# Patient Record
Sex: Female | Born: 1945
Health system: Southern US, Community
[De-identification: ages and names within clinical notes are randomized; demographics above are authoritative.]

## PROBLEM LIST (undated history)

## (undated) ENCOUNTER — Emergency Department (HOSPITAL_COMMUNITY): Admission: EM | Payer: Medicare HMO

## (undated) DIAGNOSIS — I10 Essential (primary) hypertension: Secondary | ICD-10-CM

## (undated) DIAGNOSIS — N1832 Chronic kidney disease, stage 3b: Secondary | ICD-10-CM

## (undated) DIAGNOSIS — I639 Cerebral infarction, unspecified: Secondary | ICD-10-CM

## (undated) DIAGNOSIS — E119 Type 2 diabetes mellitus without complications: Secondary | ICD-10-CM

## (undated) HISTORY — PX: OTHER SURGICAL HISTORY: SHX169

---

## 2003-03-08 ENCOUNTER — Other Ambulatory Visit: Admission: RE | Admit: 2003-03-08 | Discharge: 2003-03-08 | Payer: Self-pay | Admitting: Internal Medicine

## 2005-01-04 ENCOUNTER — Ambulatory Visit (HOSPITAL_COMMUNITY): Admission: RE | Admit: 2005-01-04 | Discharge: 2005-01-04 | Payer: Self-pay | Admitting: Internal Medicine

## 2009-10-18 ENCOUNTER — Emergency Department (HOSPITAL_COMMUNITY): Admission: EM | Admit: 2009-10-18 | Discharge: 2009-10-18 | Payer: Self-pay | Admitting: Emergency Medicine

## 2009-12-20 ENCOUNTER — Emergency Department (HOSPITAL_COMMUNITY): Admission: EM | Admit: 2009-12-20 | Discharge: 2009-12-20 | Payer: Self-pay | Admitting: Emergency Medicine

## 2010-08-08 ENCOUNTER — Ambulatory Visit (HOSPITAL_COMMUNITY)
Admission: RE | Admit: 2010-08-08 | Discharge: 2010-08-08 | Payer: Self-pay | Source: Home / Self Care | Attending: Internal Medicine | Admitting: Internal Medicine

## 2010-10-16 LAB — URINALYSIS, ROUTINE W REFLEX MICROSCOPIC
Bilirubin Urine: NEGATIVE
Hgb urine dipstick: NEGATIVE
Specific Gravity, Urine: 1.012 (ref 1.005–1.030)
Urobilinogen, UA: 0.2 mg/dL (ref 0.0–1.0)

## 2010-10-16 LAB — URINE MICROSCOPIC-ADD ON

## 2014-10-25 DIAGNOSIS — I10 Essential (primary) hypertension: Secondary | ICD-10-CM | POA: Diagnosis not present

## 2014-10-25 DIAGNOSIS — E119 Type 2 diabetes mellitus without complications: Secondary | ICD-10-CM | POA: Diagnosis not present

## 2014-10-25 DIAGNOSIS — R109 Unspecified abdominal pain: Secondary | ICD-10-CM | POA: Diagnosis not present

## 2014-10-25 DIAGNOSIS — M150 Primary generalized (osteo)arthritis: Secondary | ICD-10-CM | POA: Diagnosis not present

## 2014-11-08 DIAGNOSIS — M150 Primary generalized (osteo)arthritis: Secondary | ICD-10-CM | POA: Diagnosis not present

## 2014-11-08 DIAGNOSIS — I10 Essential (primary) hypertension: Secondary | ICD-10-CM | POA: Diagnosis not present

## 2014-11-08 DIAGNOSIS — M109 Gout, unspecified: Secondary | ICD-10-CM | POA: Diagnosis not present

## 2014-11-08 DIAGNOSIS — E119 Type 2 diabetes mellitus without complications: Secondary | ICD-10-CM | POA: Diagnosis not present

## 2014-12-29 DIAGNOSIS — I10 Essential (primary) hypertension: Secondary | ICD-10-CM | POA: Diagnosis not present

## 2014-12-29 DIAGNOSIS — E119 Type 2 diabetes mellitus without complications: Secondary | ICD-10-CM | POA: Diagnosis not present

## 2014-12-29 DIAGNOSIS — M150 Primary generalized (osteo)arthritis: Secondary | ICD-10-CM | POA: Diagnosis not present

## 2014-12-29 DIAGNOSIS — M109 Gout, unspecified: Secondary | ICD-10-CM | POA: Diagnosis not present

## 2015-04-06 DIAGNOSIS — M150 Primary generalized (osteo)arthritis: Secondary | ICD-10-CM | POA: Diagnosis not present

## 2015-04-06 DIAGNOSIS — E119 Type 2 diabetes mellitus without complications: Secondary | ICD-10-CM | POA: Diagnosis not present

## 2015-04-06 DIAGNOSIS — M109 Gout, unspecified: Secondary | ICD-10-CM | POA: Diagnosis not present

## 2015-04-06 DIAGNOSIS — M2550 Pain in unspecified joint: Secondary | ICD-10-CM | POA: Diagnosis not present

## 2015-04-06 DIAGNOSIS — I10 Essential (primary) hypertension: Secondary | ICD-10-CM | POA: Diagnosis not present

## 2015-04-27 DIAGNOSIS — E119 Type 2 diabetes mellitus without complications: Secondary | ICD-10-CM | POA: Diagnosis not present

## 2015-04-27 DIAGNOSIS — E780 Pure hypercholesterolemia: Secondary | ICD-10-CM | POA: Diagnosis not present

## 2015-04-27 DIAGNOSIS — M109 Gout, unspecified: Secondary | ICD-10-CM | POA: Diagnosis not present

## 2015-04-27 DIAGNOSIS — I10 Essential (primary) hypertension: Secondary | ICD-10-CM | POA: Diagnosis not present

## 2015-06-08 DIAGNOSIS — E119 Type 2 diabetes mellitus without complications: Secondary | ICD-10-CM | POA: Diagnosis not present

## 2015-06-08 DIAGNOSIS — I10 Essential (primary) hypertension: Secondary | ICD-10-CM | POA: Diagnosis not present

## 2015-06-08 DIAGNOSIS — M109 Gout, unspecified: Secondary | ICD-10-CM | POA: Diagnosis not present

## 2015-06-08 DIAGNOSIS — E781 Pure hyperglyceridemia: Secondary | ICD-10-CM | POA: Diagnosis not present

## 2015-07-20 DIAGNOSIS — E039 Hypothyroidism, unspecified: Secondary | ICD-10-CM | POA: Diagnosis not present

## 2015-07-20 DIAGNOSIS — E119 Type 2 diabetes mellitus without complications: Secondary | ICD-10-CM | POA: Diagnosis not present

## 2015-07-20 DIAGNOSIS — M25519 Pain in unspecified shoulder: Secondary | ICD-10-CM | POA: Diagnosis not present

## 2015-07-20 DIAGNOSIS — I10 Essential (primary) hypertension: Secondary | ICD-10-CM | POA: Diagnosis not present

## 2015-07-20 DIAGNOSIS — M109 Gout, unspecified: Secondary | ICD-10-CM | POA: Diagnosis not present

## 2015-07-20 DIAGNOSIS — E109 Type 1 diabetes mellitus without complications: Secondary | ICD-10-CM | POA: Diagnosis not present

## 2015-07-20 DIAGNOSIS — E781 Pure hyperglyceridemia: Secondary | ICD-10-CM | POA: Diagnosis not present

## 2015-10-05 DIAGNOSIS — I10 Essential (primary) hypertension: Secondary | ICD-10-CM | POA: Diagnosis not present

## 2015-10-05 DIAGNOSIS — E119 Type 2 diabetes mellitus without complications: Secondary | ICD-10-CM | POA: Diagnosis not present

## 2015-10-05 DIAGNOSIS — M2550 Pain in unspecified joint: Secondary | ICD-10-CM | POA: Diagnosis not present

## 2015-10-05 DIAGNOSIS — E109 Type 1 diabetes mellitus without complications: Secondary | ICD-10-CM | POA: Diagnosis not present

## 2015-10-05 DIAGNOSIS — M109 Gout, unspecified: Secondary | ICD-10-CM | POA: Diagnosis not present

## 2015-10-05 DIAGNOSIS — E7800 Pure hypercholesterolemia, unspecified: Secondary | ICD-10-CM | POA: Diagnosis not present

## 2015-10-05 DIAGNOSIS — E559 Vitamin D deficiency, unspecified: Secondary | ICD-10-CM | POA: Diagnosis not present

## 2015-11-02 DIAGNOSIS — I10 Essential (primary) hypertension: Secondary | ICD-10-CM | POA: Diagnosis not present

## 2015-11-02 DIAGNOSIS — M109 Gout, unspecified: Secondary | ICD-10-CM | POA: Diagnosis not present

## 2015-11-02 DIAGNOSIS — E7800 Pure hypercholesterolemia, unspecified: Secondary | ICD-10-CM | POA: Diagnosis not present

## 2015-11-02 DIAGNOSIS — E119 Type 2 diabetes mellitus without complications: Secondary | ICD-10-CM | POA: Diagnosis not present

## 2016-02-22 DIAGNOSIS — E7800 Pure hypercholesterolemia, unspecified: Secondary | ICD-10-CM | POA: Diagnosis not present

## 2016-02-22 DIAGNOSIS — M2550 Pain in unspecified joint: Secondary | ICD-10-CM | POA: Diagnosis not present

## 2016-02-22 DIAGNOSIS — E119 Type 2 diabetes mellitus without complications: Secondary | ICD-10-CM | POA: Diagnosis not present

## 2016-02-22 DIAGNOSIS — I10 Essential (primary) hypertension: Secondary | ICD-10-CM | POA: Diagnosis not present

## 2016-02-22 DIAGNOSIS — M109 Gout, unspecified: Secondary | ICD-10-CM | POA: Diagnosis not present

## 2016-06-27 DIAGNOSIS — Z23 Encounter for immunization: Secondary | ICD-10-CM | POA: Diagnosis not present

## 2016-06-27 DIAGNOSIS — I10 Essential (primary) hypertension: Secondary | ICD-10-CM | POA: Diagnosis not present

## 2016-06-27 DIAGNOSIS — E119 Type 2 diabetes mellitus without complications: Secondary | ICD-10-CM | POA: Diagnosis not present

## 2016-06-27 DIAGNOSIS — M1000 Idiopathic gout, unspecified site: Secondary | ICD-10-CM | POA: Diagnosis not present

## 2016-06-27 DIAGNOSIS — E7800 Pure hypercholesterolemia, unspecified: Secondary | ICD-10-CM | POA: Diagnosis not present

## 2016-08-23 ENCOUNTER — Emergency Department (HOSPITAL_COMMUNITY): Payer: Commercial Managed Care - HMO

## 2016-08-23 ENCOUNTER — Encounter (HOSPITAL_COMMUNITY): Payer: Self-pay

## 2016-08-23 ENCOUNTER — Emergency Department (HOSPITAL_COMMUNITY)
Admission: EM | Admit: 2016-08-23 | Discharge: 2016-08-23 | Disposition: A | Discharge status: Home / Self Care | Payer: Commercial Managed Care - HMO | Attending: Emergency Medicine | Admitting: Emergency Medicine

## 2016-08-23 DIAGNOSIS — Y939 Activity, unspecified: Secondary | ICD-10-CM | POA: Insufficient documentation

## 2016-08-23 DIAGNOSIS — Y92410 Unspecified street and highway as the place of occurrence of the external cause: Secondary | ICD-10-CM | POA: Diagnosis not present

## 2016-08-23 DIAGNOSIS — Y999 Unspecified external cause status: Secondary | ICD-10-CM | POA: Insufficient documentation

## 2016-08-23 DIAGNOSIS — I10 Essential (primary) hypertension: Secondary | ICD-10-CM | POA: Diagnosis not present

## 2016-08-23 DIAGNOSIS — S93402A Sprain of unspecified ligament of left ankle, initial encounter: Secondary | ICD-10-CM

## 2016-08-23 DIAGNOSIS — S99912A Unspecified injury of left ankle, initial encounter: Secondary | ICD-10-CM | POA: Diagnosis present

## 2016-08-23 HISTORY — DX: Essential (primary) hypertension: I10

## 2016-08-23 MED ORDER — ACETAMINOPHEN-CODEINE #3 300-30 MG PO TABS: 1.0000 | ORAL_TABLET | Freq: Four times a day (QID) | ORAL | 0 refills | Status: DC | PRN

## 2016-08-23 MED ORDER — HYDROCODONE-ACETAMINOPHEN 5-325 MG PO TABS
1.0000 | ORAL_TABLET | Freq: Once | ORAL | Status: DC
  Filled 2016-08-23: qty 1

## 2016-08-23 NOTE — ED Notes (Signed)
Called ortho to apply ASO.
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
## 2016-08-23 NOTE — Progress Notes (Signed)
Orthopedic Tech Progress Note Patient Details:  Heather Rogers [birth year] [id number]  Ortho Devices Type of Ortho Device: Ankle splint Ortho Device/Splint Location: Applied Velcro Ankle Splint to Left Foot/Ankle.  Pt tolerated well.  Informed pt of care of foot and care of device.  Family at bedside.  Ortho Device/Splint Interventions: Application, Adjustment   Kristopher Oppenheim 08/23/2016, 4:30 PM

## 2016-08-23 NOTE — ED Notes (Signed)
Pt is in stable condition upon d/c and is escorted from ED via wheelchair. 
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
## 2016-10-01 DIAGNOSIS — E7800 Pure hypercholesterolemia, unspecified: Secondary | ICD-10-CM | POA: Diagnosis not present

## 2016-10-01 DIAGNOSIS — E781 Pure hyperglyceridemia: Secondary | ICD-10-CM | POA: Diagnosis not present

## 2016-10-01 DIAGNOSIS — I10 Essential (primary) hypertension: Secondary | ICD-10-CM | POA: Diagnosis not present

## 2016-10-01 DIAGNOSIS — E119 Type 2 diabetes mellitus without complications: Secondary | ICD-10-CM | POA: Diagnosis not present

## 2016-10-01 DIAGNOSIS — E559 Vitamin D deficiency, unspecified: Secondary | ICD-10-CM | POA: Diagnosis not present

## 2016-10-01 DIAGNOSIS — Z23 Encounter for immunization: Secondary | ICD-10-CM | POA: Diagnosis not present

## 2018-05-10 ENCOUNTER — Encounter (HOSPITAL_COMMUNITY): Payer: Self-pay | Admitting: Emergency Medicine

## 2018-05-10 ENCOUNTER — Ambulatory Visit (HOSPITAL_COMMUNITY)
Admission: EM | Admit: 2018-05-10 | Discharge: 2018-05-10 | Disposition: A | Discharge status: Home / Self Care | Payer: Medicare HMO | Source: Home / Self Care

## 2018-05-10 ENCOUNTER — Emergency Department (HOSPITAL_COMMUNITY): Payer: Medicare HMO

## 2018-05-10 ENCOUNTER — Other Ambulatory Visit: Payer: Self-pay

## 2018-05-10 ENCOUNTER — Emergency Department (HOSPITAL_COMMUNITY)
Admission: EM | Admit: 2018-05-10 | Discharge: 2018-05-10 | Disposition: A | Discharge status: Home / Self Care | Payer: Medicare HMO | Attending: Emergency Medicine | Admitting: Emergency Medicine

## 2018-05-10 DIAGNOSIS — Z79899 Other long term (current) drug therapy: Secondary | ICD-10-CM | POA: Diagnosis not present

## 2018-05-10 DIAGNOSIS — M545 Low back pain: Secondary | ICD-10-CM | POA: Insufficient documentation

## 2018-05-10 DIAGNOSIS — I712 Thoracic aortic aneurysm, without rupture: Secondary | ICD-10-CM | POA: Insufficient documentation

## 2018-05-10 DIAGNOSIS — M549 Dorsalgia, unspecified: Secondary | ICD-10-CM | POA: Diagnosis present

## 2018-05-10 DIAGNOSIS — F10929 Alcohol use, unspecified with intoxication, unspecified: Secondary | ICD-10-CM | POA: Diagnosis not present

## 2018-05-10 DIAGNOSIS — I10 Essential (primary) hypertension: Secondary | ICD-10-CM | POA: Diagnosis not present

## 2018-05-10 DIAGNOSIS — R51 Headache: Secondary | ICD-10-CM | POA: Insufficient documentation

## 2018-05-10 DIAGNOSIS — E860 Dehydration: Secondary | ICD-10-CM | POA: Diagnosis not present

## 2018-05-10 LAB — CBC WITH DIFFERENTIAL/PLATELET
ABS IMMATURE GRANULOCYTES: 0.01 10*3/uL (ref 0.00–0.07)
BASOS ABS: 0 10*3/uL (ref 0.0–0.1)
Basophils Relative: 1 %
Eosinophils Absolute: 0 10*3/uL (ref 0.0–0.5)
Eosinophils Relative: 0 %
HCT: 39.7 % (ref 36.0–46.0)
HEMOGLOBIN: 11.9 g/dL — AB (ref 12.0–15.0)
IMMATURE GRANULOCYTES: 0 %
LYMPHS PCT: 47 %
Lymphs Abs: 2.2 10*3/uL (ref 0.7–4.0)
MCH: 22 pg — ABNORMAL LOW (ref 26.0–34.0)
MCHC: 30 g/dL (ref 30.0–36.0)
MCV: 73.2 fL — ABNORMAL LOW (ref 80.0–100.0)
Monocytes Absolute: 0.4 10*3/uL (ref 0.1–1.0)
Monocytes Relative: 8 %
NEUTROS ABS: 2.1 10*3/uL (ref 1.7–7.7)
NEUTROS PCT: 44 %
NRBC: 0 % (ref 0.0–0.2)
Platelets: 326 10*3/uL (ref 150–400)
RBC: 5.42 MIL/uL — ABNORMAL HIGH (ref 3.87–5.11)
RDW: 14.5 % (ref 11.5–15.5)
WBC: 4.8 10*3/uL (ref 4.0–10.5)

## 2018-05-10 LAB — BASIC METABOLIC PANEL
Anion gap: 9 (ref 5–15)
BUN: 16 mg/dL (ref 8–23)
CALCIUM: 9.6 mg/dL (ref 8.9–10.3)
CHLORIDE: 107 mmol/L (ref 98–111)
CO2: 22 mmol/L (ref 22–32)
CREATININE: 0.92 mg/dL (ref 0.44–1.00)
GFR calc Af Amer: >60 mL/min (ref >60)
GFR calc non Af Amer: >60 mL/min (ref >60)
GLUCOSE: 205 mg/dL — AB (ref 70–99)
Potassium: 3.7 mmol/L (ref 3.5–5.1)
SODIUM: 138 mmol/L (ref 135–145)

## 2018-05-10 MED ORDER — AMLODIPINE BESYLATE 5 MG PO TABS
10.0000 mg | ORAL_TABLET | Freq: Once | ORAL | Status: AC
  Administered 2018-05-10: 10 mg via ORAL
  Filled 2018-05-10: qty 2

## 2018-05-10 MED ORDER — CARVEDILOL 12.5 MG PO TABS: 12.5000 mg | ORAL_TABLET | Freq: Two times a day (BID) | ORAL | 0 refills | Status: DC

## 2018-05-10 MED ORDER — CARVEDILOL 12.5 MG PO TABS
12.5000 mg | ORAL_TABLET | Freq: Once | ORAL | Status: AC
  Administered 2018-05-10: 12.5 mg via ORAL
  Filled 2018-05-10: qty 1

## 2018-05-10 MED ORDER — HYDRALAZINE HCL 20 MG/ML IJ SOLN
5.0000 mg | Freq: Once | INTRAMUSCULAR | Status: AC
  Administered 2018-05-10: 5 mg via INTRAVENOUS
  Filled 2018-05-10: qty 1

## 2018-05-10 MED ORDER — HYDRALAZINE HCL 25 MG PO TABS
25.0000 mg | ORAL_TABLET | Freq: Once | ORAL | Status: AC
  Administered 2018-05-10: 25 mg via ORAL
  Filled 2018-05-10: qty 1

## 2018-05-10 MED ORDER — HYDRALAZINE HCL 20 MG/ML IJ SOLN: 10.0000 mg | Freq: Once | INTRAMUSCULAR | Status: DC

## 2018-05-10 MED ORDER — AMLODIPINE BESYLATE 10 MG PO TABS: 10.0000 mg | ORAL_TABLET | Freq: Every day | ORAL | 0 refills | Status: DC

## 2018-05-10 MED ORDER — HYDRALAZINE HCL 25 MG PO TABS: 25.0000 mg | ORAL_TABLET | Freq: Three times a day (TID) | ORAL | 0 refills | Status: DC

## 2018-05-10 NOTE — ED Provider Notes (Signed)
[facility] EMERGENCY DEPARTMENT Provider Note   CSN: [id number] Arrival date & time: 05/10/18  1449     History   Chief Complaint Chief Complaint  Patient presents with  . Hypertension  . Headache  . Blurred Vision    HPI Heather Rogers is a 72 y.o. female.  HPI   72 year old female PMH hypertension, T2DM and gout who presents the ED for headache, hypertension and blurry vision.  Patient states that she has been out of her BP medications for roughly 3 months due to not having a PCP.  Patient usually takes carvedilol, amlodipine, and hydralazine daily for blood pressure control.  States for the last 3 and half days she has had intermittent blurry vision, blurry at baseline compared to her normal, affected by position, worse when she sits up, better when she lays flat.  Denies eye pain.  Denies curtain falling sensation endorses whole vision blurriness.  Denies history of glaucoma.  Patient also endorses intermittent fast headaches associated with coughing.  Patient states that when she coughs she has an occipital, sharp, nonradiating headache that lasts less than a second and resolves with cessation of cough.  Patient denies recent infectious symptoms.  Denies chest pain or shortness of breath.  States this is happened before in the past when she had high blood pressure.  Patient presents her PCP and receive a shot of something she is unable to name and a couple hours later the symptoms would disappear.  Patient denies focal numbness tingling or weakness.  Past Medical History:  Diagnosis Date  . Hypertension     There are no active problems to display for this patient.   No past surgical history on file.   OB History   None      Home Medications    Prior to Admission medications   Medication Sig Start Date End Date Taking? Authorizing Provider  allopurinol (ZYLOPRIM) 100 MG tablet Take 100 mg by mouth daily.   Yes [provider]  aspirin  EC 81 MG tablet Take 81 mg by mouth daily.   Yes [provider]  atorvastatin (LIPITOR) 20 MG tablet Take 20 mg by mouth daily.   Yes [provider]  SitaGLIPtin-MetFORMIN HCl (JANUMET XR) [phone] MG TB24 Take 1 tablet by mouth daily.    Yes [provider]  acetaminophen-codeine (TYLENOL #3) 300-30 MG tablet Take 1-2 tablets by mouth every 6 (six) hours as needed for moderate pain. Patient not taking: Reported on 05/10/2018 08/23/16   Domenic Moras, PA-C  amLODipine (NORVASC) 10 MG tablet Take 1 tablet (10 mg total) by mouth daily. 05/10/18   Keenan Bachelor, MD  carvedilol (COREG) 12.5 MG tablet Take 1 tablet (12.5 mg total) by mouth 2 (two) times daily with a meal. 05/10/18   Keenan Bachelor, MD  glipiZIDE (GLUCOTROL XL) 10 MG 24 hr tablet Take 10 mg by mouth daily with breakfast.    [provider]  hydrALAZINE (APRESOLINE) 25 MG tablet Take 1 tablet (25 mg total) by mouth 3 (three) times daily. 05/10/18   Keenan Bachelor, MD    Family History No family history on file.  Social History Social History   Tobacco Use  . Smoking status: Not on file  Substance Use Topics  . Alcohol use: Not on file  . Drug use: Not on file     Allergies   Sulfa antibiotics   Review of Systems Review of Systems  Constitutional: Negative for chills and fever.  HENT: Negative for ear pain and sore throat.   Eyes: Positive for visual disturbance. Negative for pain.  Respiratory: Positive for cough. Negative for shortness of breath.   Cardiovascular: Negative for chest pain and palpitations.  Gastrointestinal: Negative for abdominal pain and vomiting.  Genitourinary: Negative for dysuria and hematuria.  Musculoskeletal: Negative for arthralgias and back pain.  Skin: Negative for color change and rash.  Neurological: Positive for headaches. Negative for dizziness, seizures, syncope and weakness.  All other systems reviewed and are negative.    Physical  Exam Updated Vital Signs BP (!) 210/74 (BP Location: Right Arm) Comment: Simultaneous filing. User may not have seen previous data.  Pulse 64 Comment: Simultaneous filing. User may not have seen previous data.  Temp 98.2 F (36.8 C)   Resp 18   SpO2 99% Comment: Simultaneous filing. User may not have seen previous data.  Physical Exam  Constitutional: She is oriented to person, place, and time. She appears well-developed and well-nourished. No distress.  HENT:  Head: Normocephalic and atraumatic.  Eyes: Pupils are equal, round, and reactive to light. Conjunctivae and EOM are normal.  Neck: Neck supple.  Cardiovascular: Normal rate and regular rhythm.  No murmur heard. Pulmonary/Chest: Effort normal and breath sounds normal. No respiratory distress.  Abdominal: Soft. There is no tenderness.  Musculoskeletal: She exhibits no edema.  Neurological: She is alert and oriented to person, place, and time. GCS eye subscore is 4. GCS verbal subscore is 5. GCS motor subscore is 6.  PERRLA, CN II through XII intact, 5/5 motor bilateral upper and lower extremities, normal sensation throughout, normal finger-to-nose bilaterally, patient able to ambulate without difficulty or ataxia. 20/20 vision bilaterally  Skin: Skin is warm and dry.  Psychiatric: She has a normal mood and affect.  Nursing note and vitals reviewed.    ED Treatments / Results  Labs (all labs ordered are listed, but only abnormal results are displayed) Labs Reviewed  CBC WITH DIFFERENTIAL/PLATELET - Abnormal; Notable for the following components:      Result Value   RBC 5.42 (*)    Hemoglobin 11.9 (*)    MCV 73.2 (*)    MCH 22.0 (*)    All other components within normal limits  BASIC METABOLIC PANEL - Abnormal; Notable for the following components:   Glucose, Bld 205 (*)    All other components within normal limits    EKG None  Radiology Ct Head Wo Contrast  Result Date: 05/10/2018 CLINICAL DATA:  72 year old  female with history of posterior headache and dizziness. High blood pressure for the past 3 days. EXAM: CT HEAD WITHOUT CONTRAST TECHNIQUE: Contiguous axial images were obtained from the base of the skull through the vertex without intravenous contrast. COMPARISON:  None. FINDINGS: Brain: Patchy areas of decreased attenuation are noted throughout the deep and periventricular white matter of the cerebral hemispheres bilaterally, compatible with chronic microvascular ischemic disease. No evidence of acute infarction, hemorrhage, hydrocephalus, extra-axial collection or mass lesion/mass effect. Vascular: No hyperdense vessel or unexpected calcification. Skull: Normal. Negative for fracture or focal lesion. Sinuses/Orbits: No acute finding. Other: None. IMPRESSION: 1. No acute intracranial abnormalities. 2. Mild chronic microvascular ischemic changes in the cerebral white matter, as above. Electronically Signed   By: Vinnie Langton M.D.   On: 05/10/2018 19:18    Procedures Procedures (including critical care time)  Medications Ordered in ED Medications  amLODipine (NORVASC) tablet 10 mg (10 mg Oral Given 05/10/18 1723)  carvedilol (COREG) tablet 12.5 mg (12.5 mg  Oral Given 05/10/18 1734)  hydrALAZINE (APRESOLINE) injection 5 mg (5 mg Intravenous Given 05/10/18 1721)  hydrALAZINE (APRESOLINE) tablet 25 mg (25 mg Oral Given 05/10/18 2110)     Initial Impression / Assessment and Plan / ED Course  I have reviewed the triage vital signs and the nursing notes.  Pertinent labs & imaging results that were available during my care of the patient were reviewed by me and considered in my medical decision making (see chart for details).     72 year old female PMH hypertension, T2DM and gout who presents the ED for headache, hypertension and blurry vision.  History as above.  Patient likely with hypertension secondary to not taking blood pressure medications for 3 months.  Patient is currently without  headache, minor blurred vision.  Patient denies recent infectious symptoms or nausea vomiting.  Doubt infectious etiology such as meningitis.  Doubt SAH due to intermittent nature of headache.  Doubt stroke due to normal neurological exam.  Potential exists for hypertensive emergency.  Initial blood pressure in triage 242 systolic.    Labs and imaging performed revealed hemoglobin 11.9, no other significant abnormalities.  Reordered home medications, carvedilol, amlodipine and given IV hydralazine.  Patient persistently hypertensive, given p.o. hydralazine.  Blood pressure to 683 systolic.  Improvement of blurry vision.  Patient given referral to PCP.  Patient given 1 month supply of blood pressure medications.  Patient stable for discharge.  Patient endorsed with my attending Dr. Zenia Resides who agrees with plan and disposition.  Final Clinical Impressions(s) / ED Diagnoses   Final diagnoses:  Hypertension, unspecified type    ED Discharge Orders         Ordered    amLODipine (NORVASC) 10 MG tablet  Daily     05/10/18 2147    carvedilol (COREG) 12.5 MG tablet  2 times daily with meals     05/10/18 2147    hydrALAZINE (APRESOLINE) 25 MG tablet  3 times daily     05/10/18 2147           Keenan Bachelor, MD 05/10/18 2315    Lacretia Leigh, MD 05/11/18 2236

## 2018-05-10 NOTE — ED Triage Notes (Signed)
Pt presents today with headaches and blurred vision for 3 days. States she does not typically get headaches and these are the worst headaches she has had. States when she coughs she feels like the back of her head is going to explode. Has only tried baby aspirin several times with no relief.

## 2018-05-10 NOTE — ED Provider Notes (Signed)
I saw and evaluated the patient, reviewed the resident's note and I agree with the findings and plan.  EKG: None 72 year old female with history of hypertension has been noncompliant for several months.  Here complaining of intermittent blurred vision without focal neurological deficits.  Her visual exam here is 20/20.  She has no signs of endorgan damage.  Will give patient small dose of IV hydralazine as well as her home medications.  Head CT is pending at this time.   Lacretia Leigh, MD 05/10/18 [phone]

## 2018-05-10 NOTE — ED Triage Notes (Addendum)
Pt arrives from urgent care for further work up of HTN, headache and intermittent blurred vision for 3-4 days. Pt has not been taking her bp meds since March due to not having a doctor anymore. No headache at present. Pt is also a diabetic. States her MD left the practice and she has [employer] so she does not know who to see now.

## 2018-05-15 ENCOUNTER — Other Ambulatory Visit: Payer: Self-pay

## 2018-05-15 ENCOUNTER — Observation Stay (HOSPITAL_COMMUNITY): Payer: Medicare HMO

## 2018-05-15 ENCOUNTER — Emergency Department (HOSPITAL_COMMUNITY): Payer: Medicare HMO

## 2018-05-15 ENCOUNTER — Observation Stay (HOSPITAL_COMMUNITY)
Admission: EM | Admit: 2018-05-15 | Discharge: 2018-05-16 | Disposition: A | Discharge status: Home / Self Care | Payer: Medicare HMO | Attending: Internal Medicine | Admitting: Internal Medicine

## 2018-05-15 ENCOUNTER — Encounter (HOSPITAL_COMMUNITY): Payer: Self-pay | Admitting: Neurology

## 2018-05-15 ENCOUNTER — Observation Stay (HOSPITAL_BASED_OUTPATIENT_CLINIC_OR_DEPARTMENT_OTHER): Payer: Medicare HMO

## 2018-05-15 DIAGNOSIS — I639 Cerebral infarction, unspecified: Secondary | ICD-10-CM | POA: Diagnosis present

## 2018-05-15 DIAGNOSIS — Z79899 Other long term (current) drug therapy: Secondary | ICD-10-CM | POA: Diagnosis not present

## 2018-05-15 DIAGNOSIS — Z23 Encounter for immunization: Secondary | ICD-10-CM | POA: Diagnosis not present

## 2018-05-15 DIAGNOSIS — Z87891 Personal history of nicotine dependence: Secondary | ICD-10-CM | POA: Diagnosis not present

## 2018-05-15 DIAGNOSIS — I63532 Cerebral infarction due to unspecified occlusion or stenosis of left posterior cerebral artery: Secondary | ICD-10-CM | POA: Diagnosis not present

## 2018-05-15 DIAGNOSIS — Z833 Family history of diabetes mellitus: Secondary | ICD-10-CM | POA: Insufficient documentation

## 2018-05-15 DIAGNOSIS — I161 Hypertensive emergency: Secondary | ICD-10-CM | POA: Diagnosis not present

## 2018-05-15 DIAGNOSIS — Z7984 Long term (current) use of oral hypoglycemic drugs: Secondary | ICD-10-CM | POA: Insufficient documentation

## 2018-05-15 DIAGNOSIS — I5030 Unspecified diastolic (congestive) heart failure: Secondary | ICD-10-CM

## 2018-05-15 DIAGNOSIS — N179 Acute kidney failure, unspecified: Secondary | ICD-10-CM | POA: Insufficient documentation

## 2018-05-15 DIAGNOSIS — Z7982 Long term (current) use of aspirin: Secondary | ICD-10-CM | POA: Insufficient documentation

## 2018-05-15 DIAGNOSIS — E1151 Type 2 diabetes mellitus with diabetic peripheral angiopathy without gangrene: Secondary | ICD-10-CM | POA: Diagnosis not present

## 2018-05-15 DIAGNOSIS — I10 Essential (primary) hypertension: Secondary | ICD-10-CM | POA: Insufficient documentation

## 2018-05-15 DIAGNOSIS — I6523 Occlusion and stenosis of bilateral carotid arteries: Secondary | ICD-10-CM | POA: Insufficient documentation

## 2018-05-15 DIAGNOSIS — R202 Paresthesia of skin: Secondary | ICD-10-CM | POA: Diagnosis present

## 2018-05-15 DIAGNOSIS — I651 Occlusion and stenosis of basilar artery: Secondary | ICD-10-CM | POA: Insufficient documentation

## 2018-05-15 DIAGNOSIS — Z8249 Family history of ischemic heart disease and other diseases of the circulatory system: Secondary | ICD-10-CM | POA: Insufficient documentation

## 2018-05-15 HISTORY — DX: Cerebral infarction, unspecified: I63.9

## 2018-05-15 HISTORY — DX: Type 2 diabetes mellitus without complications: E11.9

## 2018-05-15 LAB — CBC
HEMATOCRIT: 38.6 % (ref 36.0–46.0)
HEMOGLOBIN: 11.7 g/dL — AB (ref 12.0–15.0)
MCH: 22.4 pg — ABNORMAL LOW (ref 26.0–34.0)
MCHC: 30.3 g/dL (ref 30.0–36.0)
MCV: 73.9 fL — ABNORMAL LOW (ref 80.0–100.0)
Platelets: 319 10*3/uL (ref 150–400)
RBC: 5.22 MIL/uL — ABNORMAL HIGH (ref 3.87–5.11)
RDW: 14.3 % (ref 11.5–15.5)
WBC: 6.6 10*3/uL (ref 4.0–10.5)
nRBC: 0 % (ref 0.0–0.2)

## 2018-05-15 LAB — DIFFERENTIAL
Abs Immature Granulocytes: 0.02 10*3/uL (ref 0.00–0.07)
BASOS ABS: 0 10*3/uL (ref 0.0–0.1)
BASOS PCT: 0 %
EOS ABS: 0 10*3/uL (ref 0.0–0.5)
EOS PCT: 0 %
IMMATURE GRANULOCYTES: 0 %
LYMPHS PCT: 26 %
Lymphs Abs: 1.7 10*3/uL (ref 0.7–4.0)
Monocytes Absolute: 0.5 10*3/uL (ref 0.1–1.0)
Monocytes Relative: 7 %
NEUTROS PCT: 67 %
Neutro Abs: 4.4 10*3/uL (ref 1.7–7.7)

## 2018-05-15 LAB — I-STAT CHEM 8, ED
BUN: 19 mg/dL (ref 8–23)
CALCIUM ION: 1.19 mmol/L (ref 1.15–1.40)
CHLORIDE: 108 mmol/L (ref 98–111)
Creatinine, Ser: 1 mg/dL (ref 0.44–1.00)
GLUCOSE: 256 mg/dL — AB (ref 70–99)
HCT: 38 % (ref 36.0–46.0)
Hemoglobin: 12.9 g/dL (ref 12.0–15.0)
Potassium: 3.9 mmol/L (ref 3.5–5.1)
Sodium: 138 mmol/L (ref 135–145)
TCO2: 20 mmol/L — ABNORMAL LOW (ref 22–32)

## 2018-05-15 LAB — RAPID URINE DRUG SCREEN, HOSP PERFORMED
AMPHETAMINES: NOT DETECTED
BENZODIAZEPINES: NOT DETECTED
Barbiturates: NOT DETECTED
COCAINE: NOT DETECTED
OPIATES: NOT DETECTED
TETRAHYDROCANNABINOL: NOT DETECTED

## 2018-05-15 LAB — COMPREHENSIVE METABOLIC PANEL
ALBUMIN: 3.8 g/dL (ref 3.5–5.0)
ALK PHOS: 68 U/L (ref 38–126)
ALT: 18 U/L (ref 0–44)
ANION GAP: 9 (ref 5–15)
AST: 23 U/L (ref 15–41)
BUN: 16 mg/dL (ref 8–23)
CHLORIDE: 106 mmol/L (ref 98–111)
CO2: 20 mmol/L — AB (ref 22–32)
Calcium: 9.4 mg/dL (ref 8.9–10.3)
Creatinine, Ser: 1.06 mg/dL — ABNORMAL HIGH (ref 0.44–1.00)
GFR calc non Af Amer: 51 mL/min — ABNORMAL LOW (ref >60)
GFR, EST AFRICAN AMERICAN: 59 mL/min — AB (ref >60)
GLUCOSE: 253 mg/dL — AB (ref 70–99)
POTASSIUM: 3.9 mmol/L (ref 3.5–5.1)
SODIUM: 135 mmol/L (ref 135–145)
Total Bilirubin: 0.4 mg/dL (ref 0.3–1.2)
Total Protein: 7.6 g/dL (ref 6.5–8.1)

## 2018-05-15 LAB — URINALYSIS, ROUTINE W REFLEX MICROSCOPIC
Bilirubin Urine: NEGATIVE
GLUCOSE, UA: 50 mg/dL — AB
Hgb urine dipstick: NEGATIVE
KETONES UR: 5 mg/dL — AB
Nitrite: NEGATIVE
PH: 5 (ref 5.0–8.0)
Protein, ur: NEGATIVE mg/dL
Specific Gravity, Urine: 1.006 (ref 1.005–1.030)

## 2018-05-15 LAB — PROTIME-INR
INR: 1.04
PROTHROMBIN TIME: 13.5 s (ref 11.4–15.2)

## 2018-05-15 LAB — GLUCOSE, CAPILLARY
Glucose-Capillary: 240 mg/dL — ABNORMAL HIGH (ref 70–99)
Glucose-Capillary: 100 mg/dL — ABNORMAL HIGH (ref 70–99)

## 2018-05-15 LAB — ECHOCARDIOGRAM COMPLETE
HEIGHTINCHES: 62 in
WEIGHTICAEL: 2567.92 [oz_av]

## 2018-05-15 LAB — ETHANOL: Alcohol, Ethyl (B): <10 mg/dL (ref <10)

## 2018-05-15 LAB — APTT: APTT: 29 s (ref 24–36)

## 2018-05-15 LAB — I-STAT TROPONIN, ED: Troponin i, poc: 0 ng/mL (ref 0.00–0.08)

## 2018-05-15 LAB — CBG MONITORING, ED: Glucose-Capillary: 238 mg/dL — ABNORMAL HIGH (ref 70–99)

## 2018-05-15 MED ORDER — INSULIN ASPART 100 UNIT/ML ~~LOC~~ SOLN
0.0000 [IU] | Freq: Three times a day (TID) | SUBCUTANEOUS | Status: DC
  Administered 2018-05-15: 5 [IU] via SUBCUTANEOUS
  Administered 2018-05-16: 2 [IU] via SUBCUTANEOUS
  Administered 2018-05-16: 3 [IU] via SUBCUTANEOUS
  Filled 2018-05-15 (×3): qty 1

## 2018-05-15 MED ORDER — HYDRALAZINE HCL 20 MG/ML IJ SOLN: 5.0000 mg | Freq: Four times a day (QID) | INTRAMUSCULAR | Status: DC | PRN

## 2018-05-15 MED ORDER — ENOXAPARIN SODIUM 40 MG/0.4ML ~~LOC~~ SOLN
40.0000 mg | SUBCUTANEOUS | Status: DC
  Administered 2018-05-15: 40 mg via SUBCUTANEOUS
  Filled 2018-05-15: qty 0.4

## 2018-05-15 MED ORDER — ATORVASTATIN CALCIUM 80 MG PO TABS
80.0000 mg | ORAL_TABLET | Freq: Every day | ORAL | Status: DC
  Administered 2018-05-15 – 2018-05-16 (×2): 80 mg via ORAL
  Filled 2018-05-15 (×2): qty 1

## 2018-05-15 MED ORDER — ACETAMINOPHEN 325 MG PO TABS: 650.0000 mg | ORAL_TABLET | Freq: Four times a day (QID) | ORAL | Status: DC | PRN

## 2018-05-15 MED ORDER — GADOBUTROL 1 MMOL/ML IV SOLN
7.0000 mL | Freq: Once | INTRAVENOUS | Status: AC | PRN
  Administered 2018-05-15: 7 mL via INTRAVENOUS

## 2018-05-15 MED ORDER — SODIUM CHLORIDE 0.9% FLUSH
3.0000 mL | Freq: Two times a day (BID) | INTRAVENOUS | Status: DC
  Administered 2018-05-15 – 2018-05-16 (×2): 3 mL via INTRAVENOUS

## 2018-05-15 MED ORDER — POLYETHYLENE GLYCOL 3350 17 G PO PACK: 17.0000 g | PACK | Freq: Every day | ORAL | Status: DC | PRN

## 2018-05-15 MED ORDER — CLEVIDIPINE BUTYRATE 0.5 MG/ML IV EMUL
0.0000 mg/h | INTRAVENOUS | Status: DC
  Filled 2018-05-15: qty 50

## 2018-05-15 MED ORDER — ASPIRIN EC 81 MG PO TBEC
81.0000 mg | DELAYED_RELEASE_TABLET | Freq: Every day | ORAL | Status: DC
  Administered 2018-05-15: 81 mg via ORAL
  Filled 2018-05-15: qty 1

## 2018-05-15 MED ORDER — STROKE: EARLY STAGES OF RECOVERY BOOK
Freq: Once | Status: AC
  Administered 2018-05-15: 13:00:00
  Filled 2018-05-15: qty 1

## 2018-05-15 MED ORDER — INSULIN ASPART 100 UNIT/ML ~~LOC~~ SOLN: 0.0000 [IU] | Freq: Every day | SUBCUTANEOUS | Status: DC

## 2018-05-15 MED ORDER — ACETAMINOPHEN 650 MG RE SUPP: 650.0000 mg | Freq: Four times a day (QID) | RECTAL | Status: DC | PRN

## 2018-05-15 NOTE — Code Documentation (Signed)
72yo female arriving to [facility] via [facility] from home. Patient with reported right sided numbness, blurred vision and confusion that started at 0815 and witnessed by her neighbor. Code stroke activated by EMS and report patient is hypertensive. Stroke team at the bedside on patient arrival. Labs drawn and patient cleared for CT by Dr. Melina Copa. Patient to CT with team. CT completed. NIHSS 3, see documentation for details and code stroke times. Patient with right upper quadranopsia, decreased sensation to her right face and unable to state the month on exam. Patient's neighbor to the bedside who was with her this morning and has been assisting with her care. Neighbor reports they were at her eye appointment when she began complaining of blurred vision, however, she reports patient has been having blurred vision and numbness since she was seen at the hospital for hypertension a week ago. She also sustained a fall 3 days ago and reports her memory has been poor since that time. SBP 190s. CT showing moderate acute infarct in the left occipital lobe. LKW unclear d/t ongoing symptoms. No acute stroke treatment at this time per Dr. Cheral Marker. Bedside handoff with ED RNs [facility] and [facility].

## 2018-05-15 NOTE — Consult Note (Addendum)
Neurology Consultation  Reason for Consult: Code stroke Referring Physician: Dr. Melina Copa  CC: Decreased sensation on the right and blurred vision  History is obtained from: EMS and neighbor  HPI: Heather Rogers is a 72 y.o. female with hypertension and diabetes.  Patient was last seen normal at 8:30 AM this morning per EMS.  Her neighbor drove her to the ophthalmologist where at that time she complained of right sided numbness along with blurred vision.  It was noted that her blood pressure was systolically in the 161W.  EMS was called and code stroke was initiated.  On arrival to the ED, patient continued to complain of blurred vision and right facial numbness.  She was also confused and missed the month.  CT head showed a subacute left occipital stroke. On further history obtained from family at the bedside, her visual symptoms began on Saturday, which most likely explains the subacute appearance of the left occipital lobe stroke.    Of note, the patient has a history of semicompliance to noncompliance with her medications including her blood pressure medications. She only occasionally takes her ASA, per family. She does have 2 neighbors that take care of her.  One neighbor makes sure that her blood glucose is well controlled.  The other neighbor follows her blood pressure medications and keeps them in a medication box and also administers them to her.  LKW: 8:30 AM on 05/15/2018 tpa given?: No, NIH of 3. Completed stroke on CT  Premorbid modified Rankin scale (mRS): 0 NIH stroke score 3  ROS: Unable to obtain due to impaired ability of patient to provide a history.   Past Medical History:  Diagnosis Date  . Diabetes ([place])   . Hypertension     Family History  Problem Relation Age of Onset  . Hypertension Mother   . Hypertension Father     Social History:   has no tobacco, alcohol, and drug history on file.  Medications  Current Facility-Administered Medications:  .   clevidipine (CLEVIPREX) infusion 0.5 mg/mL, 0-21 mg/hr, Intravenous, Continuous, Kerney Elbe, MD  Current Outpatient Medications:  .  acetaminophen-codeine (TYLENOL #3) 300-30 MG tablet, Take 1-2 tablets by mouth every 6 (six) hours as needed for moderate pain. (Patient not taking: Reported on 05/10/2018), Disp: 15 tablet, Rfl: 0 .  allopurinol (ZYLOPRIM) 100 MG tablet, Take 100 mg by mouth daily., Disp: , Rfl:  .  amLODipine (NORVASC) 10 MG tablet, Take 1 tablet (10 mg total) by mouth daily., Disp: 30 tablet, Rfl: 0 .  aspirin EC 81 MG tablet, Take 81 mg by mouth daily., Disp: , Rfl:  .  atorvastatin (LIPITOR) 20 MG tablet, Take 20 mg by mouth daily., Disp: , Rfl:  .  carvedilol (COREG) 12.5 MG tablet, Take 1 tablet (12.5 mg total) by mouth 2 (two) times daily with a meal., Disp: 60 tablet, Rfl: 0 .  glipiZIDE (GLUCOTROL XL) 10 MG 24 hr tablet, Take 10 mg by mouth daily with breakfast., Disp: , Rfl:  .  hydrALAZINE (APRESOLINE) 25 MG tablet, Take 1 tablet (25 mg total) by mouth 3 (three) times daily., Disp: 90 tablet, Rfl: 0 .  SitaGLIPtin-MetFORMIN HCl (JANUMET XR) [phone] MG TB24, Take 1 tablet by mouth daily. , Disp: , Rfl:    Exam: Current vital signs: BP (!) 188/70   Pulse 81   Temp 98.4 F (36.9 C) (Oral)   Resp 14   Ht 5\' 2"  (1.575 m)   Wt 72.8 kg   SpO2 100%  BMI 29.35 kg/m  Vital signs in last 24 hours: Temp:  [98.4 F (36.9 C)] 98.4 F (36.9 C) (10/17 0913) Pulse Rate:  [78-81] 81 (10/17 0918) Resp:  [14] 14 (10/17 0918) BP: (188-191)/(70-74) 188/70 (10/17 0918) SpO2:  [100 %] 100 % (10/17 0918) Weight:  [72.8 kg] 72.8 kg (10/17 0915)  Physical Exam  Constitutional: Appears well-developed and well-nourished.  Psych: Appears an appropriate for situation, she is very abulic Eyes: No scleral injection HENT: No OP obstrucion Head: Normocephalic.  Cardiovascular: Normal rate and regular rhythm.  Respiratory: Effort normal, non-labored breathing GI: Soft.  No  distension. There is no tenderness.  Skin: WDI  Neuro: Mental Status: Patient is awake, alert to hospital, her age but not the month Patient is unable to give a clear and coherent history. No signs of aphasia or neglect Cranial Nerves: II: Visual Fields were very inconsistent during exam.  Unclear if she understood what was asked of her.  However, she did appear to have a right eye lateral hemianopsia with a inferior nasal crescentic quadrantanopsia. Left eye exhibited superior medial and inferior lateral quadrantanopsia with a medial inferior crescentic visual deficit. The only deficit that was homonymous was a right superior quadrantanopsia.   III,IV, VI: EOMI. Pupils are equal, round, and reactive to light however there is whitening behind the pupils possibly representing cataracts.   V: Facial sensation decreased on the right to light touch VII: Facial movement is symmetric.  VIII: hearing is intact to voice X: Uvula normal.  XI: Shoulder shrug is symmetric. XII: Tongue is midline without atrophy or fasciculations.  Motor: Tone is normal. Bulk is normal. 5/5 strength was present in all four extremities.  Sensory: Sensation is symmetric to light touch and temperature in the arms and legs. Deep Tendon Reflexes: 2+ and symmetric in the biceps and patellae.  Plantars: Toes are downgoing bilaterally.  Cerebellar: FNF and HKS are intact bilaterally  Labs I have reviewed labs in epic and the results pertinent to this consultation are:   CBC    Component Value Date/Time   WBC 6.6 05/15/2018 0854   RBC 5.22 (H) 05/15/2018 0854   HGB 12.9 05/15/2018 0856   HCT 38.0 05/15/2018 0856   PLT 319 05/15/2018 0854   MCV 73.9 (L) 05/15/2018 0854   MCH 22.4 (L) 05/15/2018 0854   MCHC 30.3 05/15/2018 0854   RDW 14.3 05/15/2018 0854   LYMPHSABS 1.7 05/15/2018 0854   MONOABS 0.5 05/15/2018 0854   EOSABS 0.0 05/15/2018 0854   BASOSABS 0.0 05/15/2018 0854    CMP     Component Value  Date/Time   NA 138 05/15/2018 0856   K 3.9 05/15/2018 0856   CL 108 05/15/2018 0856   CO2 22 05/10/2018 1518   GLUCOSE 256 (H) 05/15/2018 0856   BUN 19 05/15/2018 0856   CREATININE 1.00 05/15/2018 0856   CALCIUM 9.6 05/10/2018 1518   GFRNONAA >60 05/10/2018 1518   GFRAA >60 05/10/2018 1518    Lipid Panel  No results found for: CHOL, TRIG, HDL, CHOLHDL, VLDL, LDLCALC, LDLDIRECT   Imaging I have reviewed the images obtained:  CT-scan of the brain--acute to subacute infarct in the left occipital lobe, more subacute than acute.  In addition a lateral left thalamic lacune.  MRI examination of the brain--pending  Etta Quill PA-C [facility] [phone] M-F  (9:00 am- 5:00 PM) 05/15/2018, 9:37 AM    Assessment: 72 year old female brought to hospital as a code stroke with blurred vision, hypertensive urgency and  decreased sensation to her right face.   1. tPA not indicated due to symptom onset clarified as Saturday with family, in addition to completed left occipital lobe stroke seen on CT.   2. Blurred vision likely secondary to a combination of intraocular disease, severe hypertension and the new stroke. The visual exam was with patchy deficits. The only deficit that was homonymous was a right superior quadrantanopsia.  3. CT scan showed subacute ischemic infarction in the left occipital lobe. 4. Also noted on exam is bradyphrenia and confusion, most likely secondary to mild hypertensive encephalopathy, possibly in conjunction with an underlying dementia.   5. Stroke risk factors: DM, HTN  Recommendations: # MRI of the brain without contrast # MRA head without contrast, MRA neck with contrast (estimated GFR is 59, therefore cleared to obtain MRA neck with contrast) # Transthoracic Echo,  # Start patient on ASA 325mg  daily. She has been noncompliant at home, therefore compliance counseling is recommended # Start or continue Atorvastatin 80 mg/other high intensity  statin # BP goal: Modified permissive HTN protocol. Systolic goal less than 366 and diastolically less than 294 # HBAIC and Lipid profile # Telemetry monitoring # Frequent neuro checks # NPO until passes stroke swallow screen # please page stroke NP  Or  PA  Or MD from 8am -4 pm  as this patient from this time will be  followed by the stroke.   You can look them up on [email or web address]  Password [email or web address]  I have seen and examined the patient. I have formulated the assessment and plan and documented additions to the neurological exam.  Electronically signed: Dr. Kerney Elbe

## 2018-05-15 NOTE — H&P (Signed)
Date: 05/15/2018               Patient Name:  Heather Rogers MRN: [id number]  DOB: 22-Aug-1945 Age / Sex: 72 y.o., female   PCP: Patient, No Pcp Per         Medical Service: Internal Medicine Teaching Service         Attending Physician: Dr. Aldine Contes, MD    First Contact: Dr. Laural Golden Pager: [phone]  Second Contact: Dr. Heber Pierre Part Pager: [phone]       After Hours (After 5p/  First Contact Pager: [phone]  weekends / holidays): Second Contact Pager: [phone]   Chief Complaint: Blurry vision with right upper extremity tingling and numbness.  History of Present Illness: Heather Rogers is a 72 y.o. female with PMHx significant for hypertension and diabetes was brought to ED via EMS from his ophthalmologist office with sudden onset right sided tingling and numbness and worsening of her right blurry vision.  Patient was seen in the ED on 05/10/18 due to blurry vision and headache and found to have elevated blood pressure.  Patient was out of her meds for more than 40-month at that time.  Apparently her PCP retires and she has not seen by a new one yet.  She had a normal CT head at that time, was treated as hypertensive urgency and was sent home with her home blood pressure and diabetes meds.  She continued to experience blurry vision, blood pressure trending down, headache improved, this morning she had an appointment to see her ophthalmologist at 8 AM, on arriving to their office she suddenly experienced worsening of her blurry vision only in her right eye along with right-sided tingling and numbness, her blood pressure was elevated at that time, they called the EMS and sent the patient to ED without any ophthalmology evaluation.  Patient denies any headache, nausea or vomiting, chest pain, dyspnea, palpitations, orthopnea or PND. She denies any recent illnesses. She denies any recent change in her appetite, weight or bowel habits. She denies any urinary symptoms.  ED course.   On arrival to ED code stroke was called, she was having elevated blood pressure and CT head shows acute left occipital lobe infarct with a lateral left thalamus lacunar.  Neurology was consulted and she was admitted under internal medicine service to complete work-up for stroke.  Meds:  Current Meds  Medication Sig  . allopurinol (ZYLOPRIM) 100 MG tablet Take 100 mg by mouth daily.  Marland Kitchen amLODipine (NORVASC) 10 MG tablet Take 1 tablet (10 mg total) by mouth daily.  Marland Kitchen atorvastatin (LIPITOR) 20 MG tablet Take 20 mg by mouth daily.  . carvedilol (COREG) 12.5 MG tablet Take 1 tablet (12.5 mg total) by mouth 2 (two) times daily with a meal.  . glipiZIDE (GLUCOTROL XL) 10 MG 24 hr tablet Take 10 mg by mouth daily with breakfast.  . hydrALAZINE (APRESOLINE) 25 MG tablet Take 1 tablet (25 mg total) by mouth 3 (three) times daily.  . SitaGLIPtin-MetFORMIN HCl (JANUMET XR) [phone] MG TB24 Take 1 tablet by mouth daily.   . [DISCONTINUED] aspirin EC 81 MG tablet Take 81 mg by mouth daily.     Allergies: Allergies as of 05/15/2018 - Review Complete 05/15/2018  Allergen Reaction Noted  . Sulfa antibiotics  05/10/2018   Past Medical History:  Diagnosis Date  . Diabetes ([place])   . Hypertension     Family History: Multiple family members including her children with hypertension and diabetes.  Social History: Former smoker, quit in a 1986, denies any alcohol or illicit drug use.  Review of Systems: A complete ROS was negative except as per HPI.   Physical Exam: Blood pressure (!) 196/82, pulse 81, temperature 98.2 F (36.8 C), temperature source Oral, resp. rate 18, height 5\' 2"  (1.575 m), weight 72.8 kg, SpO2 98 %. Vitals:   05/15/18 1100 05/15/18 1115 05/15/18 1200 05/15/18 1240  BP: (!) 166/72 (!) 171/73 117/74 (!) 196/82  Pulse: 73 73 78 81  Resp: 15 12 15 18   Temp:    98.2 F (36.8 C)  TempSrc:    Oral  SpO2: 100% 100% 100% 98%  Weight:      Height:       General: Vital signs  reviewed.  Patient is well-developed and well-nourished, in no acute distress and cooperative with exam.  Head: Normocephalic and atraumatic. Eyes: EOMI, conjunctivae normal, no scleral icterus.  Neck: Supple, trachea midline, normal ROM, no JVD, masses, thyromegaly, or carotid bruit present.  Cardiovascular: RRR, S1 normal, S2 normal, no murmurs, gallops, or rubs. Pulmonary/Chest: Clear to auscultation bilaterally, no wheezes, rales, or rhonchi. Abdominal: Soft, non-tender, non-distended, BS +,  Extremities: No lower extremity edema bilaterally,  pulses symmetric and intact bilaterally. No cyanosis or clubbing. Neurological: A&O x3, Strength is normal and symmetric bilaterally, decreased field of vision on right, rest of cranial nerve  are grossly intact, no focal motor deficit, altered sensations on right upper extremity. Skin: Warm, dry and intact. No rashes or erythema. Psychiatric: Normal mood and affect. speech and behavior is normal. Cognition and memory are normal.  EKG: personally reviewed my interpretation is normal sinus rhythm.  Assessment & Plan by Problem: Heather Rogers is a 72 y.o. female with PMHx significant for hypertension and diabetes was brought to ED via EMS from his ophthalmologist office with sudden onset right sided tingling and numbness and worsening of her right blurry vision.  Active Problems:   Stroke [facility]) Patient presented with left occipital infarct, CT head done during previous ED visit on May 10, 2018 was normal.  TPA was not administered due to history of blurry vision for couple of days. Her risk factors include uncontrolled hypertension and diabetes along with family history.  Patient restarted her medications few days ago. She was experiencing some tingling in her right fingers and altered sensations in the right upper extremity along with decreased peripheral field of vision on right. -Admit to telemetry. -MRI brain. -MRA head and neck. -US  carotid. -Echocardiogram. -A1c -Lipid panel. -Start her on aspirin and Lipitor. -PT/OT evaluation.  Hypertension.  Currently elevated blood pressure.  Holding home dose of antihypertensives for permissive hypertension. She will need low-dose antihypertensives if blood pressure above 200/110.  Diabetes.  Hemoglobin A1c in system. She was on Janumet at home and recently started on glyburide by [facility] on Monday. -Checking A1c. -SSI  AKI.  Mildly elevated creatinine at 1.06 with baseline below 9. -Repeat BMP in the morning.  CODE STATUS.  Full DVT prophylaxis.  Lovenox. Diet.  Heart healthy/carb modified  Dispo: Admit patient to Observation with expected length of stay less than 2 midnights.  SignedLorella Nimrod, MD 05/15/2018, 1:32 PM  Pager: [phone]

## 2018-05-15 NOTE — Progress Notes (Signed)
  Echocardiogram 2D Echocardiogram has been performed.  Heather Rogers 05/15/2018, 2:36 PM

## 2018-05-15 NOTE — ED Notes (Signed)
Report given to 5 C 

## 2018-05-15 NOTE — Progress Notes (Signed)
Bilateral carotid duplex completed - 1% to 39 % ICA stenosis. Vertebral artery flow is antegrade. [place] Cornelious Bartolucci,RVS 05/15/2018 2:28 PM

## 2018-05-15 NOTE — Progress Notes (Signed)
PT Cancellation Note  Patient Details Name: KEYANI RIGDON MRN: [id number] DOB: July 04, 1946   Cancelled Treatment:    Reason Eval/Treat Not Completed: Patient at procedure or test/unavailable Pt currently out of room for testing. Will reattempt as schedule allows.   Leighton Ruff, PT, DPT  Acute Rehabilitation Services  Pager: [phone] Office: [phone]  Rudean Hitt 05/15/2018, 2:36 PM

## 2018-05-15 NOTE — ED Triage Notes (Signed)
Patient arrived via EMS with new onset blurry vision and right sided fac e num bness and tingling.  Per Neighbor, patient was at eye Dr office this morning in her normal until 8:30 am this morning when neighbor noticed confusion, blurred vision, and patient c/o right facial numbness. Code stroke called at [place], patient arrived ast [phone] and brought to CT. Patient has cataracts upon further examination by neuro, poor peripheral field of vision. Still c/o right facial numbness. A/o x 3, difficulty remembering month and year.

## 2018-05-15 NOTE — Progress Notes (Signed)
Pt has been gone since this nurse arrived. Pt went to have testing done since around 12:00 pm and previous nurse has not been able to keep up with her 2 hr neuro checks

## 2018-05-15 NOTE — ED Provider Notes (Signed)
[place] EMERGENCY DEPARTMENT Provider Note   CSN: [id number] Arrival date & time: 05/15/18  8563   An emergency department physician performed an initial assessment on this suspected stroke patient at 0848(butler).  History   Chief Complaint Chief Complaint  Patient presents with  . Code Stroke    HPI Heather Rogers is a 72 y.o. female.  She presents by EMS for a stroke activation.  She has been having some on and off tingling on the right side of her body for a few days.  She was going to the eye doctor today for some worsening blurry vision.  At the doctor she had another attack of right face right arm numbness and she was unable to sign in at the doctor's.  They called 911.  Currently she says her tingling is improved.  sHe is being evaluated neurology  The history is provided by the patient, the EMS personnel and a relative.  Cerebrovascular Accident  This is a new problem. The current episode started less than 1 hour ago. The problem has been gradually improving. Pertinent negatives include no chest pain, no abdominal pain, no headaches and no shortness of breath. Nothing aggravates the symptoms. Nothing relieves the symptoms. She has tried nothing for the symptoms. The treatment provided no relief.    Past Medical History:  Diagnosis Date  . Hypertension     There are no active problems to display for this patient.   No past surgical history on file.   OB History   None      Home Medications    Prior to Admission medications   Medication Sig Start Date End Date Taking? Authorizing Provider  acetaminophen-codeine (TYLENOL #3) 300-30 MG tablet Take 1-2 tablets by mouth every 6 (six) hours as needed for moderate pain. Patient not taking: Reported on 05/10/2018 08/23/16   Domenic Moras, PA-C  allopurinol (ZYLOPRIM) 100 MG tablet Take 100 mg by mouth daily.    [provider]  amLODipine (NORVASC) 10 MG tablet Take 1 tablet (10 mg  total) by mouth daily. 05/10/18   Keenan Bachelor, MD  aspirin EC 81 MG tablet Take 81 mg by mouth daily.    [provider]  atorvastatin (LIPITOR) 20 MG tablet Take 20 mg by mouth daily.    [provider]  carvedilol (COREG) 12.5 MG tablet Take 1 tablet (12.5 mg total) by mouth 2 (two) times daily with a meal. 05/10/18   Keenan Bachelor, MD  glipiZIDE (GLUCOTROL XL) 10 MG 24 hr tablet Take 10 mg by mouth daily with breakfast.    [provider]  hydrALAZINE (APRESOLINE) 25 MG tablet Take 1 tablet (25 mg total) by mouth 3 (three) times daily. 05/10/18   Keenan Bachelor, MD  SitaGLIPtin-MetFORMIN HCl (JANUMET XR) [phone] MG TB24 Take 1 tablet by mouth daily.     [provider]    Family History No family history on file.  Social History Social History   Tobacco Use  . Smoking status: Not on file  Substance Use Topics  . Alcohol use: Not on file  . Drug use: Not on file     Allergies   Sulfa antibiotics   Review of Systems Review of Systems  Constitutional: Negative for fever.  HENT: Negative for sore throat.   Eyes: Positive for visual disturbance (blurry).  Respiratory: Negative for shortness of breath.   Cardiovascular: Negative for chest pain.  Gastrointestinal: Negative for abdominal pain.  Genitourinary: Negative for dysuria.  Musculoskeletal: Negative for neck pain.  Skin: Negative for rash.  Neurological: Positive for numbness. Negative for headaches.     Physical Exam Updated Vital Signs BP (!) 191/74 (BP Location: Left Arm)   Pulse 78   Temp 98.4 F (36.9 C) (Oral)   Ht 5\' 2"  (1.575 m)   Wt 72.8 kg   SpO2 100%   BMI 29.35 kg/m   Physical Exam  Constitutional: She appears well-developed and well-nourished. No distress.  HENT:  Head: Normocephalic and atraumatic.  Eyes: Conjunctivae are normal.  Neck: Neck supple.  Cardiovascular: Normal rate and regular rhythm.  No murmur heard. Pulmonary/Chest: Effort normal  and breath sounds normal. No respiratory distress.  Abdominal: Soft. There is no tenderness.  Musculoskeletal: Normal range of motion. She exhibits no edema or deformity.  Neurological: She is alert.  Patient has some blurry vision.  Possibly he has a field cut.  She has some numbness on the right side of her face.  Her strength seems intact upper and lower extremities symmetric.  Skin: Skin is warm and dry.  Psychiatric: She has a normal mood and affect.  Nursing note and vitals reviewed.    ED Treatments / Results  Labs (all labs ordered are listed, but only abnormal results are displayed) Labs Reviewed  CBC - Abnormal; Notable for the following components:      Result Value   RBC 5.22 (*)    Hemoglobin 11.7 (*)    MCV 73.9 (*)    MCH 22.4 (*)    All other components within normal limits  I-STAT CHEM 8, ED - Abnormal; Notable for the following components:   Glucose, Bld 256 (*)    TCO2 20 (*)    All other components within normal limits  CBG MONITORING, ED - Abnormal; Notable for the following components:   Glucose-Capillary 238 (*)    All other components within normal limits  PROTIME-INR  APTT  DIFFERENTIAL  ETHANOL  COMPREHENSIVE METABOLIC PANEL  RAPID URINE DRUG SCREEN, HOSP PERFORMED  URINALYSIS, ROUTINE W REFLEX MICROSCOPIC  I-STAT TROPONIN, ED    EKG EKG Interpretation  Date/Time:  Thursday May 15 2018 09:25:41 EDT Ventricular Rate:  75 PR Interval:    QRS Duration: 121 QT Interval:  405 QTC Calculation: 453 R Axis:   -2 Text Interpretation:  Sinus rhythm Probable left ventricular hypertrophy ST elevation, consider anterior injury no prior to compare with Confirmed by Aletta Edouard [phone]) on 05/15/2018 9:40:49 AM   Radiology Ct Head Code Stroke Wo Contrast  Result Date: 05/15/2018 CLINICAL DATA:  Code stroke. Right-sided weakness and numbness with slurred speech EXAM: CT HEAD WITHOUT CONTRAST TECHNIQUE: Contiguous axial images were obtained from  the base of the skull through the vertex without intravenous contrast. COMPARISON:  05/10/2018 FINDINGS: Brain: Cytotoxic edema appearance in the parasagittal left occipital lobe involving a moderate area. Infarct in the lateral left thalamus, also seen previously. Mild chronic small vessel ischemic type change in the cerebral white matter. No hemorrhage, hydrocephalus, or masslike finding Vascular: No hyperdense vessel. Skull: No acute or aggressive finding Sinuses/Orbits: Negative Other: These results were communicated to Dr. Cheral Marker at 9:21 [place] 10/17/2019by text page via the [facility] messaging system. ASPECTS [facility] Stroke Program Early CT Score) Not scored given the PCA infarct. IMPRESSION: 1. Moderate acute infarct in the left occipital lobe. 2. Lateral left thalamus lacune. Electronically Signed   By: Monte Fantasia M.D.   On: 05/15/2018 09:23    Procedures .Critical Care Performed by: Aletta Edouard  C, MD Authorized by: Hayden Rasmussen, MD   Critical care provider statement:    Critical care time (minutes):  45   Critical care time was exclusive of:  Separately billable procedures and treating other patients   Critical care was necessary to treat or prevent imminent or life-threatening deterioration of the following conditions:  CNS failure or compromise   Critical care was time spent personally by me on the following activities:  Discussions with consultants, evaluation of patient's response to treatment, examination of patient, ordering and performing treatments and interventions, ordering and review of laboratory studies, ordering and review of radiographic studies, pulse oximetry, re-evaluation of patient's condition, obtaining history from patient or surrogate, review of old charts and development of treatment plan with patient or surrogate   I assumed direction of critical care for this patient from another provider in my specialty: no     (including critical care time)  Medications  Ordered in ED Medications  clevidipine (CLEVIPREX) infusion 0.5 mg/mL (has no administration in time range)     Initial Impression / Assessment and Plan / ED Course  I have reviewed the triage vital signs and the nursing notes.  Pertinent labs & imaging results that were available during my care of the patient were reviewed by me and considered in my medical decision making (see chart for details).  Clinical Course as of May 15 1209  Thu May 15, 2018  0955 He states her PCP is Dr. Coralyn Mark from [facility].  Neurology is recommending medical admission for further work-up of her stroke and management of her hypertension.   [MB]  1032 Nurses stated that they have not started Cleviprex infusion due to neurology change in their recommendations to only start her if she is over 863 systolic.   [MB]  8177 Discussed with internal medicine team who will evaluate the patient for admission.   [MB]    Clinical Course User Index [MB] Hayden Rasmussen, MD   Neurology has evaluated the patient and they do not recommend any TPA.  By her CT she has already signs of a stroke.  They are recommending admission to the hospital for further work-up.  Final Clinical Impressions(s) / ED Diagnoses   Final diagnoses:  Acute ischemic stroke [facility])  Hypertensive emergency    ED Discharge Orders    None       Hayden Rasmussen, MD 05/16/18 1222

## 2018-05-15 NOTE — H&P (View-Only) (Signed)
Date: 05/15/2018               Patient Name:  Heather Rogers MRN: [id number]  DOB: [birth year] Age / Sex: 72 y.o., female   PCP: Patient, No Pcp Per         Medical Service: Internal Medicine Teaching Service         Attending Physician: Dr. Aldine Contes, MD    First Contact: Dr. Laural Golden Pager: [phone]  Second Contact: Dr. Heber [place] Pager: [phone]       After Hours (After 5p/  First Contact Pager: [phone]  weekends / holidays): Second Contact Pager: [phone]   Chief Complaint: Blurry vision with right upper extremity tingling and numbness.  History of Present Illness: Heather Rogers is a 72 y.o. female with PMHx significant for hypertension and diabetes was brought to ED via EMS from his ophthalmologist office with sudden onset right sided tingling and numbness and worsening of her right blurry vision.  Patient was seen in the ED on 05/10/18 due to blurry vision and headache and found to have elevated blood pressure.  Patient was out of her meds for more than 45-month at that time.  Apparently her PCP retires and she has not seen by a new one yet.  She had a normal CT head at that time, was treated as hypertensive urgency and was sent home with her home blood pressure and diabetes meds.  She continued to experience blurry vision, blood pressure trending down, headache improved, this morning she had an appointment to see her ophthalmologist at 8 AM, on arriving to their office she suddenly experienced worsening of her blurry vision only in her right eye along with right-sided tingling and numbness, her blood pressure was elevated at that time, they called the EMS and sent the patient to ED without any ophthalmology evaluation.  Patient denies any headache, nausea or vomiting, chest pain, dyspnea, palpitations, orthopnea or PND. She denies any recent illnesses. She denies any recent change in her appetite, weight or bowel habits. She denies any urinary symptoms.  ED course.   On arrival to ED code stroke was called, she was having elevated blood pressure and CT head shows acute left occipital lobe infarct with a lateral left thalamus lacunar.  Neurology was consulted and she was admitted under internal medicine service to complete work-up for stroke.  Meds:  Current Meds  Medication Sig  . allopurinol (ZYLOPRIM) 100 MG tablet Take 100 mg by mouth daily.  Marland Kitchen amLODipine (NORVASC) 10 MG tablet Take 1 tablet (10 mg total) by mouth daily.  Marland Kitchen atorvastatin (LIPITOR) 20 MG tablet Take 20 mg by mouth daily.  . carvedilol (COREG) 12.5 MG tablet Take 1 tablet (12.5 mg total) by mouth 2 (two) times daily with a meal.  . glipiZIDE (GLUCOTROL XL) 10 MG 24 hr tablet Take 10 mg by mouth daily with breakfast.  . hydrALAZINE (APRESOLINE) 25 MG tablet Take 1 tablet (25 mg total) by mouth 3 (three) times daily.  . SitaGLIPtin-MetFORMIN HCl (JANUMET XR) [phone] MG TB24 Take 1 tablet by mouth daily.   . [DISCONTINUED] aspirin EC 81 MG tablet Take 81 mg by mouth daily.     Allergies: Allergies as of 05/15/2018 - Review Complete 05/15/2018  Allergen Reaction Noted  . Sulfa antibiotics  05/10/2018   Past Medical History:  Diagnosis Date  . Diabetes ([place])   . Hypertension     Family History: Multiple family members including her children with hypertension and diabetes.  Social History: Former smoker, quit in a 1986, denies any alcohol or illicit drug use.  Review of Systems: A complete ROS was negative except as per HPI.   Physical Exam: Blood pressure (!) 196/82, pulse 81, temperature 98.2 F (36.8 C), temperature source Oral, resp. rate 18, height 5\' 2"  (1.575 m), weight 72.8 kg, SpO2 98 %. Vitals:   05/15/18 1100 05/15/18 1115 05/15/18 1200 05/15/18 1240  BP: (!) 166/72 (!) 171/73 117/74 (!) 196/82  Pulse: 73 73 78 81  Resp: 15 12 15 18   Temp:    98.2 F (36.8 C)  TempSrc:    Oral  SpO2: 100% 100% 100% 98%  Weight:      Height:       General: Vital signs  reviewed.  Patient is well-developed and well-nourished, in no acute distress and cooperative with exam.  Head: Normocephalic and atraumatic. Eyes: EOMI, conjunctivae normal, no scleral icterus.  Neck: Supple, trachea midline, normal ROM, no JVD, masses, thyromegaly, or carotid bruit present.  Cardiovascular: RRR, S1 normal, S2 normal, no murmurs, gallops, or rubs. Pulmonary/Chest: Clear to auscultation bilaterally, no wheezes, rales, or rhonchi. Abdominal: Soft, non-tender, non-distended, BS +,  Extremities: No lower extremity edema bilaterally,  pulses symmetric and intact bilaterally. No cyanosis or clubbing. Neurological: A&O x3, Strength is normal and symmetric bilaterally, decreased field of vision on right, rest of cranial nerve  are grossly intact, no focal motor deficit, altered sensations on right upper extremity. Skin: Warm, dry and intact. No rashes or erythema. Psychiatric: Normal mood and affect. speech and behavior is normal. Cognition and memory are normal.  EKG: personally reviewed my interpretation is normal sinus rhythm.  Assessment & Plan by Problem: Heather Rogers is a 72 y.o. female with PMHx significant for hypertension and diabetes was brought to ED via EMS from his ophthalmologist office with sudden onset right sided tingling and numbness and worsening of her right blurry vision.  Active Problems:   Stroke [facility]) Patient presented with left occipital infarct, CT head done during previous ED visit on May 10, 2018 was normal.  TPA was not administered due to history of blurry vision for couple of days. Her risk factors include uncontrolled hypertension and diabetes along with family history.  Patient restarted her medications few days ago. She was experiencing some tingling in her right fingers and altered sensations in the right upper extremity along with decreased peripheral field of vision on right. -Admit to telemetry. -MRI brain. -MRA head and neck. -US  carotid. -Echocardiogram. -A1c -Lipid panel. -Start her on aspirin and Lipitor. -PT/OT evaluation.  Hypertension.  Currently elevated blood pressure.  Holding home dose of antihypertensives for permissive hypertension. She will need low-dose antihypertensives if blood pressure above 200/110.  Diabetes.  Hemoglobin A1c in system. She was on Janumet at home and recently started on glyburide by [facility] on Monday. -Checking A1c. -SSI  AKI.  Mildly elevated creatinine at 1.06 with baseline below 9. -Repeat BMP in the morning.  CODE STATUS.  Full DVT prophylaxis.  Lovenox. Diet.  Heart healthy/carb modified  Dispo: Admit patient to Observation with expected length of stay less than 2 midnights.  SignedLorella Nimrod, MD 05/15/2018, 1:32 PM  Pager: [phone]

## 2018-05-15 NOTE — ED Notes (Signed)
Md VO to hold Cleviprex until SBP > 200

## 2018-05-16 DIAGNOSIS — H538 Other visual disturbances: Secondary | ICD-10-CM

## 2018-05-16 DIAGNOSIS — I6300 Cerebral infarction due to thrombosis of unspecified precerebral artery: Secondary | ICD-10-CM

## 2018-05-16 DIAGNOSIS — Z882 Allergy status to sulfonamides status: Secondary | ICD-10-CM

## 2018-05-16 DIAGNOSIS — Z7982 Long term (current) use of aspirin: Secondary | ICD-10-CM

## 2018-05-16 DIAGNOSIS — Z79899 Other long term (current) drug therapy: Secondary | ICD-10-CM

## 2018-05-16 DIAGNOSIS — G8191 Hemiplegia, unspecified affecting right dominant side: Secondary | ICD-10-CM | POA: Diagnosis not present

## 2018-05-16 DIAGNOSIS — N179 Acute kidney failure, unspecified: Secondary | ICD-10-CM

## 2018-05-16 DIAGNOSIS — I10 Essential (primary) hypertension: Secondary | ICD-10-CM | POA: Diagnosis not present

## 2018-05-16 DIAGNOSIS — E119 Type 2 diabetes mellitus without complications: Secondary | ICD-10-CM

## 2018-05-16 DIAGNOSIS — Z7902 Long term (current) use of antithrombotics/antiplatelets: Secondary | ICD-10-CM

## 2018-05-16 DIAGNOSIS — I63532 Cerebral infarction due to unspecified occlusion or stenosis of left posterior cerebral artery: Secondary | ICD-10-CM

## 2018-05-16 LAB — BASIC METABOLIC PANEL
Anion gap: 11 (ref 5–15)
BUN: 15 mg/dL (ref 8–23)
CALCIUM: 9.9 mg/dL (ref 8.9–10.3)
CO2: 18 mmol/L — ABNORMAL LOW (ref 22–32)
CREATININE: 0.98 mg/dL (ref 0.44–1.00)
Chloride: 110 mmol/L (ref 98–111)
GFR calc non Af Amer: 56 mL/min — ABNORMAL LOW (ref >60)
Glucose, Bld: 100 mg/dL — ABNORMAL HIGH (ref 70–99)
Potassium: 3.5 mmol/L (ref 3.5–5.1)
SODIUM: 139 mmol/L (ref 135–145)

## 2018-05-16 LAB — HEMOGLOBIN A1C
Hgb A1c MFr Bld: 10.9 % — ABNORMAL HIGH (ref 4.8–5.6)
Mean Plasma Glucose: 266.13 mg/dL

## 2018-05-16 LAB — GLUCOSE, CAPILLARY
GLUCOSE-CAPILLARY: 133 mg/dL — AB (ref 70–99)
Glucose-Capillary: 160 mg/dL — ABNORMAL HIGH (ref 70–99)

## 2018-05-16 LAB — LIPID PANEL
CHOL/HDL RATIO: 4.7 ratio
Cholesterol: 126 mg/dL (ref 0–200)
HDL: 27 mg/dL — ABNORMAL LOW (ref >40)
LDL CALC: 64 mg/dL (ref 0–99)
Triglycerides: 175 mg/dL — ABNORMAL HIGH (ref <150)
VLDL: 35 mg/dL (ref 0–40)

## 2018-05-16 MED ORDER — AMLODIPINE BESYLATE 10 MG PO TABS: 10.0000 mg | ORAL_TABLET | Freq: Every day | ORAL | 0 refills | Status: DC

## 2018-05-16 MED ORDER — CLOPIDOGREL BISULFATE 75 MG PO TABS
75.0000 mg | ORAL_TABLET | Freq: Every day | ORAL | Status: DC
  Administered 2018-05-16: 75 mg via ORAL
  Filled 2018-05-16: qty 1

## 2018-05-16 MED ORDER — CLOPIDOGREL BISULFATE 75 MG PO TABS: 75.0000 mg | ORAL_TABLET | Freq: Every day | ORAL | 0 refills | Status: AC

## 2018-05-16 MED ORDER — ASPIRIN 81 MG PO TBEC: 81.0000 mg | DELAYED_RELEASE_TABLET | Freq: Every day | ORAL | Status: DC

## 2018-05-16 MED ORDER — INFLUENZA VAC SPLIT HIGH-DOSE 0.5 ML IM SUSY
0.5000 mL | PREFILLED_SYRINGE | Freq: Once | INTRAMUSCULAR | Status: AC
  Administered 2018-05-16: 0.5 mL via INTRAMUSCULAR
  Filled 2018-05-16 (×2): qty 0.5

## 2018-05-16 MED ORDER — ENOXAPARIN SODIUM 40 MG/0.4ML ~~LOC~~ SOLN
40.0000 mg | SUBCUTANEOUS | Status: DC
  Administered 2018-05-16: 40 mg via SUBCUTANEOUS
  Filled 2018-05-16: qty 0.4

## 2018-05-16 MED ORDER — CARVEDILOL 12.5 MG PO TABS: 12.5000 mg | ORAL_TABLET | Freq: Two times a day (BID) | ORAL | 0 refills | Status: DC

## 2018-05-16 MED ORDER — ATORVASTATIN CALCIUM 80 MG PO TABS: 80.0000 mg | ORAL_TABLET | Freq: Every day | ORAL | 0 refills | Status: DC

## 2018-05-16 MED ORDER — ASPIRIN EC 81 MG PO TBEC
81.0000 mg | DELAYED_RELEASE_TABLET | Freq: Every day | ORAL | Status: DC
  Administered 2018-05-16: 81 mg via ORAL
  Filled 2018-05-16: qty 1

## 2018-05-16 MED ORDER — PNEUMOCOCCAL 13-VAL CONJ VACC IM SUSP
0.5000 mL | Freq: Once | INTRAMUSCULAR | Status: AC
  Administered 2018-05-16: 0.5 mL via INTRAMUSCULAR
  Filled 2018-05-16: qty 0.5

## 2018-05-16 NOTE — Discharge Instructions (Signed)
Heather Rogers,  It was a pleasure taking care of you during your hospital stay. You were hospitalized for a left occipital stroke. We are recommending you start taking some new medications to prevent risk for another stroke.   Please start taking Plavix 75 mg and Aspirin 81 mg daily for 3 weeks, then continue taking just aspirin 81 mg daily. Start taking atorvastatin 80 mg daily.  Also, please restart your anti-hypertensive medications Amlodipine 10 mg and Carvedilol 12.5 mg daily, in 1 week, starting on 10/25. Please make sure to take your diabetes medications as well and follow up with your PCP in one week. Also your cardiology appointment is on 10/22, see below.   Transesophageal echocardiogram procedure: Scheduled for 05/20/18 at 8:00am with Dr. Fransico Him  Instructions: - Nothing to eat after midnight (Monday 10/21) the night before your procedure. You may take your medications with a sip of water the morning of your procedure. You can hold your oral diabetes medications on the morning of your procedure to limit low blood sugar.  - Arrive to [facility]	[place] admitting at 6:30am prior to scheduled procedure time of 8:00am - You will need a responsible adult to drive patient home

## 2018-05-16 NOTE — Evaluation (Signed)
Physical Therapy Evaluation Patient Details Name: Heather Rogers MRN: [id number] DOB: 01-09-1946 Today's Date: 05/16/2018   History of Present Illness  (P) 72 y.o. female with PMHx significant for hypertension and diabetes was brought to ED via EMS from her ophthalmologist office with sudden onset right sided tingling and numbness and worsening of her right blurry vision.  MRI revealed L occipital lobe stroke.     Clinical Impression  Pt admitted with above diagnosis. Pt currently with functional limitations due to the deficits listed below (see PT Problem List). PTA active and independent. On eval, she required supervision transfers and min guard assist ambulation 250 feet without AD. Pt presents with balance deficits primarily relating to visual deficits. Pt will benefit from skilled PT to increase their independence and safety with mobility to allow discharge to the venue listed below.       Follow Up Recommendations Home health PT;Supervision - Intermittent    Equipment Recommendations  None recommended by PT    Recommendations for Other Services       Precautions / Restrictions Precautions Precautions: (P) Fall      Mobility  Bed Mobility Overal bed mobility: Modified Independent             General bed mobility comments: +rail  Transfers Overall transfer level: Needs assistance Equipment used: None Transfers: Sit to/from [facility] Sit to Stand: Supervision Stand pivot transfers: Supervision       General transfer comment: supervision for safety. No physical assist.   Ambulation/Gait Ambulation/Gait assistance: Min guard Gait Distance (Feet): 250 Feet Assistive device: None Gait Pattern/deviations: Step-through pattern;Decreased stride length Gait velocity: decreased Gait velocity interpretation: 1.31 - 2.62 ft/sec, indicative of limited community ambulator General Gait Details: slow, stiff gait with outstretched arms to help  stabilize balance. Pt reports feeling unsteady and unable to walk fast. No overt LOB noted.   Stairs Stairs: Yes Stairs assistance: Min guard Stair Management: One rail Right;Step to pattern;Forwards Number of Stairs: 12 General stair comments: alternating pattern with ascent and step to pattern with descent. Pt reports this is her baseline.   Wheelchair Mobility    Modified Rankin (Stroke Patients Only) Modified Rankin (Stroke Patients Only) Pre-Morbid Rankin Score: No symptoms Modified Rankin: Moderate disability     Balance Overall balance assessment: Needs assistance Sitting-balance support: Feet supported Sitting balance-Leahy Scale: Good                           Standardized Balance Assessment Standardized Balance Assessment : Dynamic Gait Index   Dynamic Gait Index Level Surface: Normal Change in Gait Speed: Normal Gait with Horizontal Head Turns: Mild Impairment Gait with Vertical Head Turns: Mild Impairment Gait and Pivot Turn: Normal Step Over Obstacle: Mild Impairment Step Around Obstacles: Mild Impairment Steps: Mild Impairment Total Score: 19       Pertinent Vitals/Pain Pain Assessment: No/denies pain    Home Living Family/patient expects to be discharged to:: (P) Private residence Living Arrangements: Other relatives(niece) Available Help at Discharge: Friend(s);Available PRN/intermittently Type of Home: House Home Access: Level entry     Home Layout: One level Home Equipment: None      Prior Function Level of Independence: Independent         Comments: Drives. Active.     Hand Dominance        Extremity/Trunk Assessment   Upper Extremity Assessment Upper Extremity Assessment: Defer to OT evaluation    Lower Extremity Assessment Lower Extremity  Assessment: Overall WFL for tasks assessed    Cervical / Trunk Assessment Cervical / Trunk Assessment: Normal  Communication   Communication: No difficulties  Cognition  Arousal/Alertness: Awake/alert Behavior During Therapy: WFL for tasks assessed/performed Overall Cognitive Status: Within Functional Limits for tasks assessed                                        General Comments      Exercises     Assessment/Plan    PT Assessment Patient needs continued PT services  PT Problem List Decreased mobility;Decreased knowledge of precautions;Decreased activity tolerance;Decreased balance       PT Treatment Interventions Therapeutic activities;Gait training;Therapeutic exercise;Patient/family education;Balance training;Stair training;Functional mobility training    PT Goals (Current goals can be found in the Care Plan section)  Acute Rehab PT Goals Patient Stated Goal: independence and return to active schedule PT Goal Formulation: With patient Time For Goal Achievement: 05/30/18 Potential to Achieve Goals: Good    Frequency Min 4X/week   Barriers to discharge        Co-evaluation               AM-PAC PT "6 Clicks" Daily Activity  Outcome Measure Difficulty turning over in bed (including adjusting bedclothes, sheets and blankets)?: None Difficulty moving from lying on back to sitting on the side of the bed? : None Difficulty sitting down on and standing up from a chair with arms (e.g., wheelchair, bedside commode, etc,.)?: None Help needed moving to and from a bed to chair (including a wheelchair)?: None Help needed walking in hospital room?: A Little Help needed climbing 3-5 steps with a railing? : A Little 6 Click Score: 22    End of Session Equipment Utilized During Treatment: Gait belt Activity Tolerance: Patient tolerated treatment well Patient left: in bed;with call bell/phone within reach;with family/visitor present Nurse Communication: Mobility status PT Visit Diagnosis: Unsteadiness on feet (R26.81)    Time: 1025-8527 PT Time Calculation (min) (ACUTE ONLY): 17 min   Charges:   PT Evaluation $PT Eval  Moderate Complexity: 1 Mod          Lorrin Goodell, PT  Office # [phone] Pager [phone]   Lorriane Shire 05/16/2018, 10:40 AM

## 2018-05-16 NOTE — Progress Notes (Addendum)
STROKE TEAM PROGRESS NOTE  HPI:( Dr Cheral Marker ) Heather Rogers is a 72 y.o. female with hypertension and diabetes.  Patient was last seen normal at 8:30 AM this morning per EMS.  Her neighbor drove her to the ophthalmologist where at that time she complained of right sided numbness along with blurred vision.  It was noted that her blood pressure was systolically in the 169C.  EMS was called and code stroke was initiated.  On arrival to the ED, patient continued to complain of blurred vision and right facial numbness.  She was also confused and missed the month.  CT head showed a subacute left occipital stroke. On further history obtained from family at the bedside, her visual symptoms began on Saturday, which most likely explains the subacute appearance of the left occipital lobe stroke.   Of note, the patient has a history of semicompliance to noncompliance with her medications including her blood pressure medications. She only occasionally takes her ASA, per family. She does have 2 neighbors that take care of her.  One neighbor makes sure that her blood glucose is well controlled.  The other neighbor follows her blood pressure medications and keeps them in a medication box and also administers them to her.  LKW: 8:30 AM on 05/15/2008 tpa given?: No, NIH of 3. Completed stroke on CT. Premorbid modified Rankin scale (mRS): 0 NIH stroke score 3  INTERVAL HISTORY Her daughter is at the bedside.  She was concerned about ongoing high BP at home. Seen Monday at [facility]. New meds added. Planned to f/u in 1 week. Went to see her eye doctor Thurs because she was unable to see her fork when eating on Wed, while there her R side became numb.   Vitals:   05/15/18 1949 05/15/18 2337 05/16/18 0351 05/16/18 0757  BP: (!) 175/81 (!) 161/70 (!) 174/83 (!) 168/74  Pulse: 84  80 81  Resp: 18 16 16 16   Temp: 98.7 F (37.1 C) 98.7 F (37.1 C) 98 F (36.7 C) 99.1 F (37.3 C)  TempSrc: Oral Oral  Oral Oral  SpO2: 98% 97% 100% 100%  Weight:      Height:        CBC:  Recent Labs  Lab 05/10/18 1518 05/15/18 0854 05/15/18 0856  WBC 4.8 6.6  --   NEUTROABS 2.1 4.4  --   HGB 11.9* 11.7* 12.9  HCT 39.7 38.6 38.0  MCV 73.2* 73.9*  --   PLT 326 319  --     Basic Metabolic Panel:  Recent Labs  Lab 05/15/18 0854 05/15/18 0856 05/16/18 0420  NA 135 138 139  K 3.9 3.9 3.5  CL 106 108 110  CO2 20*  --  18*  GLUCOSE 253* 256* 100*  BUN 16 19 15   CREATININE 1.06* 1.00 0.98  CALCIUM 9.4  --  9.9   Lipid Panel: No results found for: CHOL, TRIG, HDL, CHOLHDL, VLDL, LDLCALC HgbA1c:  Lab Results  Component Value Date   HGBA1C 10.9 (H) 05/16/2018   Urine Drug Screen:     Component Value Date/Time   LABOPIA NONE DETECTED 05/15/2018 1040   COCAINSCRNUR NONE DETECTED 05/15/2018 1040   LABBENZ NONE DETECTED 05/15/2018 1040   AMPHETMU NONE DETECTED 05/15/2018 1040   THCU NONE DETECTED 05/15/2018 1040   LABBARB NONE DETECTED 05/15/2018 1040    Alcohol Level     Component Value Date/Time   [facility] <10 05/15/2018 7893    IMAGING Mr Jodene Nam Neck W  Wo Contrast  Addendum Date: 05/15/2018   ADDENDUM REPORT: 05/15/2018 16:53 ADDENDUM: Study discussed by telephone with Dr. Shan Levans on 05/15/2018 at 1642 hours. Electronically Signed   By: Genevie Ann M.D.   On: 05/15/2018 16:53   Result Date: 05/15/2018 CLINICAL DATA:  72 year old female with left PCA infarct on code stroke head CT today performed for right side weakness, numbness, slurred speech. By report, visual symptoms began 5 days ago. EXAM: MRI HEAD WITHOUT CONTRAST MRA HEAD WITHOUT CONTRAST MRA NECK WITHOUT AND WITH CONTRAST TECHNIQUE: Multiplanar, multiecho pulse sequences of the brain and surrounding structures were obtained without intravenous contrast. Angiographic images of the Circle of Willis were obtained using MRA technique without intravenous contrast. Angiographic images of the neck were obtained using MRA technique without  and with intravenous contrast. Carotid stenosis measurements (when applicable) are obtained utilizing NASCET criteria, using the distal internal carotid diameter as the denominator. CONTRAST:  7 milliliters Gadavist COMPARISON:  Head CT at 0856 hours. FINDINGS: MRI HEAD FINDINGS Brain: Patchy restricted diffusion in the left PCA territory from the left hippocampus through to the medial left occipital pole (series 5 images 59 and 62. Associated cytotoxic edema with T2 and FLAIR hyperintensity but no mass effect. No convincing associated acute hemorrhage, although there are possibly occasional chronic micro hemorrhages in the brain (right posterior temporal lobe or insula on series 16, image 31. No other restricted diffusion. No midline shift, mass effect, evidence of mass lesion, ventriculomegaly, extra-axial collection or acute intracranial hemorrhage. Cervicomedullary junction and pituitary are within normal limits. Patchy bilateral cerebral white matter T2 and FLAIR hyperintensity but no chronic cortical encephalomalacia. There is faint T2 and FLAIR hyperintensity in the left thalamus although no restricted diffusion. This is superimposed on a chronic left thalamic lacune on series 14, image 15. Mild patchy T2 hyperintensity in the pons. Negative cerebellum. Vascular: Major intracranial vascular flow voids are preserved. Skull and upper cervical spine: Negative for age, C5-C6 chronic disc and endplate degeneration. Visualized bone marrow signal is within normal limits. Sinuses/Orbits: Negative orbit soft tissues. Paranasal sinuses and mastoids are stable and well pneumatized. Other: Visible internal auditory structures appear normal. Scalp and face soft tissues appear negative. MRA NECK FINDINGS Precontrast time-of-flight images demonstrate antegrade flow in both cervical carotid and vertebral arteries to the skull base. There is irregularity at the left carotid bifurcation. Post-contrast neck MRA images reveal a  3 vessel arch configuration. The proximal common carotid arteries are tortuous. No great vessel origin stenosis. No right CCA stenosis. Right carotid bifurcation is patent without stenosis. Negative cervical right ICA. Tortuous left CCA without stenosis. At the left carotid bifurcation there is irregularity at the left ICA origin with stenosis estimated at 50 % with respect to the distal vessel. Note is made of moderate left ICA horizontal petrous segment stenosis on series 1007, image 6. The vertebral artery origins are within normal limits. The vertebral arteries are codominant and tortuous in the neck with no convincing stenosis. MRA HEAD FINDINGS Codominant distal vertebral arteries with antegrade flow to the basilar. Distal vertebral irregularity but no significant stenosis. The left PICA is patent. The right AICA appears dominant. The vertebrobasilar junction is fenestrated, normal variant. There is moderate mid basilar irregularity and stenosis, also evident on the post-contrast neck MRA images. The distal basilar is patent. SCA and right PCA origins are patent. The left PCA is occluded from the mid P1 through the proximal P2 segment with reconstituted flow signal distally (series 1035, image 8). There is mild  stenosis of the right PCA distal P1. Posterior communicating arteries are diminutive or absent. Antegrade flow in both ICA siphons. Moderate to severe stenosis in the left ICA or is on ule petrous segment corroborated with the Neck MRA images. A moderate degree of stenosis is seen at the proximal right ICA cavernous segment (series 9, image 78). The distal ICA siphons and ophthalmic artery origins are patent. Normal MCA and ACA origins. Diminutive or absent anterior communicating artery. Visible ACA branches are within normal limits. Both MCA bifurcations are patent without stenosis. Visible left MCA branches are within normal limits. There is mild irregularity of the visible right MCA branches.  IMPRESSION: 1. Positive for segmental Left PCA occlusion with acute to subacute patchy Left PCA infarct. Territory from the left hippocampus through the medial left occipital pole is affected with edema but no associated hemorrhage or mass effect. 2. Neck MRA is negative for hemodynamically significant stenosis in the neck (50% Left ICA origin stenosis), but Head MRA is positive for: - moderate to severe Left ICA petrous segment and moderate Right ICA cavernous segment stenoses. - moderate mid Basilar Artery stenosis. 3. Chronic small vessel ischemia in the left thalamus. Mild to moderate for age cerebral white matter signal changes also probably small vessel disease related. Electronically Signed: By: Genevie Ann M.D. On: 05/15/2018 16:19   Mr Brain Wo Contrast  Addendum Date: 05/15/2018   ADDENDUM REPORT: 05/15/2018 16:53 ADDENDUM: Study discussed by telephone with Dr. Shan Levans on 05/15/2018 at 1642 hours. Electronically Signed   By: Genevie Ann M.D.   On: 05/15/2018 16:53   Result Date: 05/15/2018 CLINICAL DATA:  72 year old female with left PCA infarct on code stroke head CT today performed for right side weakness, numbness, slurred speech. By report, visual symptoms began 5 days ago. EXAM: MRI HEAD WITHOUT CONTRAST MRA HEAD WITHOUT CONTRAST MRA NECK WITHOUT AND WITH CONTRAST TECHNIQUE: Multiplanar, multiecho pulse sequences of the brain and surrounding structures were obtained without intravenous contrast. Angiographic images of the Circle of Willis were obtained using MRA technique without intravenous contrast. Angiographic images of the neck were obtained using MRA technique without and with intravenous contrast. Carotid stenosis measurements (when applicable) are obtained utilizing NASCET criteria, using the distal internal carotid diameter as the denominator. CONTRAST:  7 milliliters Gadavist COMPARISON:  Head CT at 0856 hours. FINDINGS: MRI HEAD FINDINGS Brain: Patchy restricted diffusion in the left PCA  territory from the left hippocampus through to the medial left occipital pole (series 5 images 59 and 62. Associated cytotoxic edema with T2 and FLAIR hyperintensity but no mass effect. No convincing associated acute hemorrhage, although there are possibly occasional chronic micro hemorrhages in the brain (right posterior temporal lobe or insula on series 16, image 31. No other restricted diffusion. No midline shift, mass effect, evidence of mass lesion, ventriculomegaly, extra-axial collection or acute intracranial hemorrhage. Cervicomedullary junction and pituitary are within normal limits. Patchy bilateral cerebral white matter T2 and FLAIR hyperintensity but no chronic cortical encephalomalacia. There is faint T2 and FLAIR hyperintensity in the left thalamus although no restricted diffusion. This is superimposed on a chronic left thalamic lacune on series 14, image 15. Mild patchy T2 hyperintensity in the pons. Negative cerebellum. Vascular: Major intracranial vascular flow voids are preserved. Skull and upper cervical spine: Negative for age, C5-C6 chronic disc and endplate degeneration. Visualized bone marrow signal is within normal limits. Sinuses/Orbits: Negative orbit soft tissues. Paranasal sinuses and mastoids are stable and well pneumatized. Other: Visible internal auditory structures appear  normal. Scalp and face soft tissues appear negative. MRA NECK FINDINGS Precontrast time-of-flight images demonstrate antegrade flow in both cervical carotid and vertebral arteries to the skull base. There is irregularity at the left carotid bifurcation. Post-contrast neck MRA images reveal a 3 vessel arch configuration. The proximal common carotid arteries are tortuous. No great vessel origin stenosis. No right CCA stenosis. Right carotid bifurcation is patent without stenosis. Negative cervical right ICA. Tortuous left CCA without stenosis. At the left carotid bifurcation there is irregularity at the left ICA origin  with stenosis estimated at 50 % with respect to the distal vessel. Note is made of moderate left ICA horizontal petrous segment stenosis on series 1007, image 6. The vertebral artery origins are within normal limits. The vertebral arteries are codominant and tortuous in the neck with no convincing stenosis. MRA HEAD FINDINGS Codominant distal vertebral arteries with antegrade flow to the basilar. Distal vertebral irregularity but no significant stenosis. The left PICA is patent. The right AICA appears dominant. The vertebrobasilar junction is fenestrated, normal variant. There is moderate mid basilar irregularity and stenosis, also evident on the post-contrast neck MRA images. The distal basilar is patent. SCA and right PCA origins are patent. The left PCA is occluded from the mid P1 through the proximal P2 segment with reconstituted flow signal distally (series 1035, image 8). There is mild stenosis of the right PCA distal P1. Posterior communicating arteries are diminutive or absent. Antegrade flow in both ICA siphons. Moderate to severe stenosis in the left ICA or is on ule petrous segment corroborated with the Neck MRA images. A moderate degree of stenosis is seen at the proximal right ICA cavernous segment (series 9, image 78). The distal ICA siphons and ophthalmic artery origins are patent. Normal MCA and ACA origins. Diminutive or absent anterior communicating artery. Visible ACA branches are within normal limits. Both MCA bifurcations are patent without stenosis. Visible left MCA branches are within normal limits. There is mild irregularity of the visible right MCA branches. IMPRESSION: 1. Positive for segmental Left PCA occlusion with acute to subacute patchy Left PCA infarct. Territory from the left hippocampus through the medial left occipital pole is affected with edema but no associated hemorrhage or mass effect. 2. Neck MRA is negative for hemodynamically significant stenosis in the neck (50% Left ICA  origin stenosis), but Head MRA is positive for: - moderate to severe Left ICA petrous segment and moderate Right ICA cavernous segment stenoses. - moderate mid Basilar Artery stenosis. 3. Chronic small vessel ischemia in the left thalamus. Mild to moderate for age cerebral white matter signal changes also probably small vessel disease related. Electronically Signed: By: Genevie Ann M.D. On: 05/15/2018 16:19   Mr Jodene Nam Head Wo Contrast  Addendum Date: 05/15/2018   ADDENDUM REPORT: 05/15/2018 16:53 ADDENDUM: Study discussed by telephone with Dr. Shan Levans on 05/15/2018 at 1642 hours. Electronically Signed   By: Genevie Ann M.D.   On: 05/15/2018 16:53   Result Date: 05/15/2018 CLINICAL DATA:  72 year old female with left PCA infarct on code stroke head CT today performed for right side weakness, numbness, slurred speech. By report, visual symptoms began 5 days ago. EXAM: MRI HEAD WITHOUT CONTRAST MRA HEAD WITHOUT CONTRAST MRA NECK WITHOUT AND WITH CONTRAST TECHNIQUE: Multiplanar, multiecho pulse sequences of the brain and surrounding structures were obtained without intravenous contrast. Angiographic images of the Circle of Willis were obtained using MRA technique without intravenous contrast. Angiographic images of the neck were obtained using MRA technique without and  with intravenous contrast. Carotid stenosis measurements (when applicable) are obtained utilizing NASCET criteria, using the distal internal carotid diameter as the denominator. CONTRAST:  7 milliliters Gadavist COMPARISON:  Head CT at 0856 hours. FINDINGS: MRI HEAD FINDINGS Brain: Patchy restricted diffusion in the left PCA territory from the left hippocampus through to the medial left occipital pole (series 5 images 59 and 62. Associated cytotoxic edema with T2 and FLAIR hyperintensity but no mass effect. No convincing associated acute hemorrhage, although there are possibly occasional chronic micro hemorrhages in the brain (right posterior temporal lobe  or insula on series 16, image 31. No other restricted diffusion. No midline shift, mass effect, evidence of mass lesion, ventriculomegaly, extra-axial collection or acute intracranial hemorrhage. Cervicomedullary junction and pituitary are within normal limits. Patchy bilateral cerebral white matter T2 and FLAIR hyperintensity but no chronic cortical encephalomalacia. There is faint T2 and FLAIR hyperintensity in the left thalamus although no restricted diffusion. This is superimposed on a chronic left thalamic lacune on series 14, image 15. Mild patchy T2 hyperintensity in the pons. Negative cerebellum. Vascular: Major intracranial vascular flow voids are preserved. Skull and upper cervical spine: Negative for age, C5-C6 chronic disc and endplate degeneration. Visualized bone marrow signal is within normal limits. Sinuses/Orbits: Negative orbit soft tissues. Paranasal sinuses and mastoids are stable and well pneumatized. Other: Visible internal auditory structures appear normal. Scalp and face soft tissues appear negative. MRA NECK FINDINGS Precontrast time-of-flight images demonstrate antegrade flow in both cervical carotid and vertebral arteries to the skull base. There is irregularity at the left carotid bifurcation. Post-contrast neck MRA images reveal a 3 vessel arch configuration. The proximal common carotid arteries are tortuous. No great vessel origin stenosis. No right CCA stenosis. Right carotid bifurcation is patent without stenosis. Negative cervical right ICA. Tortuous left CCA without stenosis. At the left carotid bifurcation there is irregularity at the left ICA origin with stenosis estimated at 50 % with respect to the distal vessel. Note is made of moderate left ICA horizontal petrous segment stenosis on series 1007, image 6. The vertebral artery origins are within normal limits. The vertebral arteries are codominant and tortuous in the neck with no convincing stenosis. MRA HEAD FINDINGS Codominant  distal vertebral arteries with antegrade flow to the basilar. Distal vertebral irregularity but no significant stenosis. The left PICA is patent. The right AICA appears dominant. The vertebrobasilar junction is fenestrated, normal variant. There is moderate mid basilar irregularity and stenosis, also evident on the post-contrast neck MRA images. The distal basilar is patent. SCA and right PCA origins are patent. The left PCA is occluded from the mid P1 through the proximal P2 segment with reconstituted flow signal distally (series 1035, image 8). There is mild stenosis of the right PCA distal P1. Posterior communicating arteries are diminutive or absent. Antegrade flow in both ICA siphons. Moderate to severe stenosis in the left ICA or is on ule petrous segment corroborated with the Neck MRA images. A moderate degree of stenosis is seen at the proximal right ICA cavernous segment (series 9, image 78). The distal ICA siphons and ophthalmic artery origins are patent. Normal MCA and ACA origins. Diminutive or absent anterior communicating artery. Visible ACA branches are within normal limits. Both MCA bifurcations are patent without stenosis. Visible left MCA branches are within normal limits. There is mild irregularity of the visible right MCA branches. IMPRESSION: 1. Positive for segmental Left PCA occlusion with acute to subacute patchy Left PCA infarct. Territory from the left hippocampus through  the medial left occipital pole is affected with edema but no associated hemorrhage or mass effect. 2. Neck MRA is negative for hemodynamically significant stenosis in the neck (50% Left ICA origin stenosis), but Head MRA is positive for: - moderate to severe Left ICA petrous segment and moderate Right ICA cavernous segment stenoses. - moderate mid Basilar Artery stenosis. 3. Chronic small vessel ischemia in the left thalamus. Mild to moderate for age cerebral white matter signal changes also probably small vessel disease  related. Electronically Signed: By: Genevie Ann M.D. On: 05/15/2018 16:19   Ct Head Code Stroke Wo Contrast  Result Date: 05/15/2018 CLINICAL DATA:  Code stroke. Right-sided weakness and numbness with slurred speech EXAM: CT HEAD WITHOUT CONTRAST TECHNIQUE: Contiguous axial images were obtained from the base of the skull through the vertex without intravenous contrast. COMPARISON:  05/10/2018 FINDINGS: Brain: Cytotoxic edema appearance in the parasagittal left occipital lobe involving a moderate area. Infarct in the lateral left thalamus, also seen previously. Mild chronic small vessel ischemic type change in the cerebral white matter. No hemorrhage, hydrocephalus, or masslike finding Vascular: No hyperdense vessel. Skull: No acute or aggressive finding Sinuses/Orbits: Negative Other: These results were communicated to Dr. Cheral Marker at 9:21 [place] 10/17/2019by text page via the [facility] messaging system. ASPECTS [facility] Stroke Program Early CT Score) Not scored given the PCA infarct. IMPRESSION: 1. Moderate acute infarct in the left occipital lobe. 2. Lateral left thalamus lacune. Electronically Signed   By: Monte Fantasia M.D.   On: 05/15/2018 09:23   Carotid Doppler   There is 1-39% bilateral ICA stenosis. Vertebral artery flow is antegrade.   2D Echocardiogram  - Left ventricle: The cavity size was normal. Wall thickness was increased in a pattern of moderate LVH. Systolic function was normal. The estimated ejection fraction was in the range of 55% to 60%. Wall motion was normal; there were no regional wall motion abnormalities. Doppler parameters are consistent with abnormal left ventricular relaxation (grade 1 diastolic dysfunction). - Aortic valve: There was no stenosis. - Mitral valve: There was no significant regurgitation. - Left atrium: The atrium was mildly dilated. - Right ventricle: The cavity size was normal. Systolic function was normal. - Pulmonary arteries: No complete TR doppler jet so  unable to estimate PA systolic pressure. Impressions:  Normal LV size with moderate LV hypertrophy. EF 55-60%. Normal RV size and systolic function. No significant valvular abnormalities.   PHYSICAL EXAM Pleasant elderly Caucasian lady currently not in distress. . Afebrile. Head is nontraumatic. Neck is supple without bruit.    Cardiac exam no murmur or gallop. Lungs are clear to auscultation. Distal pulses are well felt. Neurological Exam ;  Awake  Alert oriented x 3. Normal speech and language.diminished as tension, registration and recall. Recall 0/3.eye movements full without nystagmus.fundi were not visualized. Vision acuity  appears normal.dense right homonymous hemianopsia. Hearing is normal. Palatal movements are normal. Face symmetric. Tongue midline. Normal strength, tone, reflexes and coordination. Normal sensation. Gait deferred.  NIHSS 2  ASSESSMENT/PLAN Heather Rogers is a 72 y.o. female with history of HTN and DB presenting with blurred vision, hypertensive urgency and altered sensation R face.   Stroke:  left PCA infarct embolic secondary to unknown  source  Code Stroke CT head L occipital stroke. L thalamic lacune   MRI  L PCA occlusion.  L PCA infarct. Old L thalamic lacune. Small vessel disease. Atrophy.   MRA neck negative  MRA head mod to severe L ICA petrous and  mod R ICA cavernous segment stenosis, mod mid BA stenosis.   Carotid doppler B ICA 1-39% stenosis, VAs antegrade   2D Echo  EF 55-60%. No source of embolus   Needs OP TEE and loop. Have asked cardiology to arrange  LDL pending   HgbA1c 10.9  Lovenox 40 mg sq daily for VTE prophylaxis  No antithrombotic prior to admission, now on aspirin 81 mg daily. Given mild stroke, recommend aspirin 81 mg and plavix 75 mg daily x 3 weeks, then aspirin alone. Orders adjusted.   Therapy recommendations:  HH PT, OT, 3N1  Disposition:  pending   Hypertensive Urgency  BP as high as  196/83  Stable . Permissive hypertension (OK if < 220/120) but gradually normalize in 5-7 days . Long-term BP goal normotensive  Hyperlipidemia  Home meds:  lipitor 20  Now on lipitor 80 in hospital  LDL pending, goal < 70  Continue statin at discharge  Diabetes type II  HgbA1c 10.9, goal < 7.0  Uncontrolled  Other Stroke Risk Factors  Advanced age  Overweight, Body mass index is 29.35 kg/m., recommend weight loss, diet and exercise as appropriate   Other Active Problems  Cognitive decline post stroke  CKI, Cr 1.06->1.00  Hospital day # 0  Burnetta Sabin, MSN, APRN, ANVP-BC, AGPCNP-BC Advanced Practice Stroke Nurse [place] for Schedule & Pager information 05/16/2018 10:36 AM  I have personally examined this patient, reviewed notes, independently viewed imaging studies, participated in medical decision making and plan of care.ROS completed by me personally and pertinent positives fully documented  I have made any additions or clarifications directly to the above note. Agree with note above. 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 Recommend dual antiplatelet therapy x 3 weeks and then aspirin alone. Check transesophageal echocardiogram and loop recorder for paroxysmal A. Fib. Patient advised not to drive. Long discussion with the patient and daughter at the bedside as well as with Dr. Donalee Citrin and his team and answered questions. Greater than 50% time during this 35 minute visit was spent on counseling and coordination of care but embolic stroke and discussion about plan for evaluation and treatment and answered questions. She may also consider possible pars patient in the [place] trial if  interested. She will be given information to review and decide  Antony Contras, MD Medical Director [place] Pager: [phone] 05/16/2018 5:06 PM  To contact Stroke Continuity provider, please refer to [email or web address] After hours, contact General Neurology

## 2018-05-16 NOTE — Progress Notes (Addendum)
    [facility] has been requested to perform a transesophageal echocardiogram on Ms. Heather Rogers  for stroke.  After careful review of history and examination, the risks and benefits of transesophageal echocardiogram have been explained including risks of esophageal damage, perforation (1:10,000 risk), bleeding, pharyngeal hematoma as well as other potential complications associated with conscious sedation including aspiration, arrhythmia, respiratory failure and death. Alternatives to treatment were discussed, questions were answered. Patient is willing to proceed. Also discussed with daughter [name] over phone.  TEE - 05/20/18 as outpatient Dr. Radford Pax @ 8000am. Please arrive to [facility] for check in at 6:30am. NPO after midnight. Meds with sips is okay.   Leanor Kail, PA-C 05/16/2018 2:20 PM

## 2018-05-16 NOTE — Progress Notes (Signed)
OT Progress Note  Focus of session on education regarding safety and visual field cuts in addition to apparent cognitive deficits. Family present for education and verbalizes understanding for need to refrain from driving and need for S with all IADL tasks, especially medication management and financial management. Discussed safety issues regarding cooking at stove level. Began education on compensatory techniques to improve "functional vision". Recommend follow up with her eye doctor to have a full field visual assessement in order to determine her baseline field cut.     05/16/18 1100  OT Visit Information  Last OT Received On 05/16/18  Assistance Needed +1  History of Present Illness 72 y.o. female with PMHx significant for hypertension and diabetes was brought to ED via EMS from her ophthalmologist office with sudden onset right sided tingling and numbness and worsening of her right blurry vision.  MRI revealed L occipital lobe stroke.   Precautions  Precautions Fall  Pain Assessment  Pain Assessment No/denies pain  Cognition  Arousal/Alertness Awake/alert  Behavior During Therapy WFL for tasks assessed/performed  Overall Cognitive Status Impaired/Different from baseline  Area of Impairment Orientation;Memory;Safety/judgement;Awareness;Problem solving  Orientation Level Disoriented to;Time  Memory Decreased short-term memory  Safety/Judgement Decreased awareness of safety;Decreased awareness of deficits  Awareness Emergent  Problem Solving Slow processing  General Comments will further assess  OT - End of Session  Activity Tolerance Patient tolerated treatment well  Patient left in bed;with call bell/phone within reach;with family/visitor present  Nurse Communication Other (comment) (DC needs)  OT Assessment/Plan  OT Plan Discharge plan remains appropriate  OT Visit Diagnosis Unsteadiness on feet (R26.81);Low vision, both eyes (H54.2);Other (comment)  OT Frequency (ACUTE ONLY)  Min 3X/week  Follow Up Recommendations Home health OT;Supervision - Intermittent  OT Equipment 3 in 1 bedside commode  AM-PAC OT "6 Clicks" Daily Activity Outcome Measure  Help from another person eating meals? 4  Help from another person taking care of personal grooming? 3  Help from another person toileting, which includes using toliet, bedpan, or urinal? 3  Help from another person bathing (including washing, rinsing, drying)? 3  Help from another person to put on and taking off regular upper body clothing? 4  Help from another person to put on and taking off regular lower body clothing? 3  6 Click Score 20  ADL G Code Conversion CJ  OT Goal Progression  Progress towards OT goals Progressing toward goals  Acute Rehab OT Goals  Patient Stated Goal independence and return to active schedule  OT Goal Formulation With patient/family  Time For Goal Achievement 05/30/18  Potential to Achieve Goals Good  OT Time Calculation  OT Start Time (ACUTE ONLY) 1055  OT Stop Time (ACUTE ONLY) 1115  OT Time Calculation (min) 20 min  OT General Charges  $OT Visit 1 Visit  OT Treatments  $Self Care/Home Management  8-22 mins  Maurie Boettcher, OT/L   Acute OT Clinical Specialist [place] Pager [phone] Office [phone]

## 2018-05-16 NOTE — Care Management Note (Addendum)
Case Management Note  Patient Details  Name: Heather Rogers MRN: [id number] Date of Birth: 03-24-1946  Subjective/Objective: 72yo female presented with blurry vision with RUE tingling and numbness.                   Action/Plan: CM met with patient/family to discuss transitional needs. Patient lived at home with her mentally challenged niece whom she cared for, independent with ADLs, with no DME in use. PCP verified as: Emelia Loron, NP with [facility] [place]; pharmacy of choice: [facility], [place]. PT/OT eval completed with HHPT/OT and BSC recommended, with patient/daughter agreeable. Preference lists provided, with [facility] and [facility] selected; AVS updated. HH referral given to Daine Gip; DME referral given to Jeneen Rinks [facility] liaison. Patient needs HHPT/OT & BSC orders and F2F. Patient's daughter verbalized her and patient's church family could provide assistance post transition and transportation home. No further needs from CM.   Expected Discharge Date:  05/16/18               Expected Discharge Plan:  [place]  In-House Referral:  NA  Discharge planning Services  CM Consult  Post Acute Care Choice:  Durable Medical Equipment, Home Health Choice offered to:  Patient, Adult Children  DME Arranged:  Bedside commode DME Agency:  [facility] Arranged:  PT, OT [facility] Agency:  [facility]  Status of Service:  In process, will continue to follow  If discussed at Long Length of Stay Meetings, dates discussed:    Additional Comments:  Midge Minium RN, BSN, NCM-BC, ACM-RN [phone] 05/16/2018, 11:55 AM

## 2018-05-16 NOTE — Progress Notes (Signed)
Occupational Therapy Evaluation Patient Details Name: Heather Rogers MRN: [id number] DOB: April 07, 1946 Today's Date: 05/16/2018    History of Present Illness 72 y.o. female with PMHx significant for hypertension and diabetes was brought to ED via EMS from her ophthalmologist office with sudden onset right sided tingling and numbness and worsening of her right blurry vision.  MRI revealed L occipital lobe stroke.    Clinical Impression   PTA, pt independent with ADL and mobility, drove and was very active. Pt presents with R UE numbness and apparent R field cut affecting safety with mobility, ADL and IADL tasks. Recommend follow up with [place] and refrain from driving. Pt will need S with IADL tasks to reduce risk of injury. Will follow up with family to complete education.     Follow Up Recommendations  Home health OT;Supervision - Intermittent    Equipment Recommendations  3 in 1 bedside commode    Recommendations for Other Services       Precautions / Restrictions Precautions Precautions: Fall      Mobility Bed Mobility Overal bed mobility: Modified Independent             General bed mobility comments: +rail  Transfers Overall transfer level: Needs assistance Equipment used: None Transfers: Sit to/from Stand;Stand Pivot Transfers Sit to Stand: Supervision Stand pivot transfers: Supervision       General transfer comment: supervision for safety. No physical assist.     Balance Overall balance assessment: Needs assistance Sitting-balance support: Feet supported Sitting balance-Leahy Scale: Good       Standing balance-Leahy Scale: Fair                   Standardized Balance Assessment Standardized Balance Assessment : Dynamic Gait Index   Dynamic Gait Index Level Surface: Normal Change in Gait Speed: Normal Gait with Horizontal Head Turns: Mild Impairment Gait with Vertical Head Turns: Mild Impairment Gait and Pivot Turn: Normal Step Over  Obstacle: Mild Impairment Step Around Obstacles: Mild Impairment Steps: Mild Impairment Total Score: 19     ADL either performed or assessed with clinical judgement   ADL Overall ADL's : Needs assistance/impaired Eating/Feeding: Set up   Grooming: Set up;Supervision/safety;Standing   Upper Body Bathing: Set up;Supervision/ safety;Sitting   Lower Body Bathing: Supervison/ safety;Set up;Sit to/from stand   Upper Body Dressing : Set up;Sitting   Lower Body Dressing: Set up;Supervision/safety;Sit to/from stand   Toilet Transfer: Supervision/safety;Ambulation   Toileting- Clothing Manipulation and Hygiene: Modified independent   Tub/ Shower Transfer: Min guard   Functional mobility during ADLs: Min guard;Cueing for safety General ADL Comments: Poor spatial orientation wtih mobility in unfamiliar situation     Vision Baseline Vision/History: Wears glasses Wears Glasses: At all times Patient Visual Report: Blurring of vision;Peripheral vision impairment Vision Assessment?: Yes Eye Alignment: Within Functional Limits Ocular Range of Motion: Within Functional Limits Alignment/Gaze Preference: Within Defined Limits Tracking/Visual Pursuits: Decreased smoothness of horizontal tracking;Decreased smoothness of vertical tracking Saccades: Additional head turns occurred during testing;Decreased speed of saccadic movement Convergence: Within functional limits Visual Fields: Right homonymous hemianopsia Additional Comments: Able to read however reading is slow and pt states letters"move around"; reports L sdie of my face is blurred     Perception Perception Comments: will further assess   Praxis Praxis Praxis tested?: Within functional limits    Pertinent Vitals/Pain Pain Assessment: No/denies pain     Hand Dominance Right   Extremity/Trunk Assessment Upper Extremity Assessment Upper Extremity Assessment: RUE deficits/detail RUE Deficits / Details:  mild weakness; mild  coordination deficits due to decreased sensation RUE Sensation: decreased light touch;decreased proprioception(but using functionally) RUE Coordination: decreased fine motor;decreased gross motor(but functional)   Lower Extremity Assessment Lower Extremity Assessment: Defer to PT evaluation   Cervical / Trunk Assessment Cervical / Trunk Assessment: Normal   Communication Communication Communication: No difficulties   Cognition Arousal/Alertness: Awake/alert Behavior During Therapy: WFL for tasks assessed/performed Overall Cognitive Status: Deficits - will further assess; note decreased STM; slow processing                                     General Comments       Exercises     Shoulder Instructions      Home Living Family/patient expects to be discharged to:: Private residence Living Arrangements: Other relatives Available Help at Discharge: Friend(s);Available PRN/intermittently(Can provide more assistance if needed) Type of Home: House Home Access: Level entry     Home Layout: One level     Bathroom Shower/Tub: Teacher, early years/pre: Standard Bathroom Accessibility: Yes How Accessible: Accessible via walker Home Equipment: None          Prior Functioning/Environment Level of Independence: Independent        Comments: Drives. Active.        OT Problem List: Impaired balance (sitting and/or standing);Impaired vision/perception;Decreased coordination;Decreased knowledge of use of DME or AE;Decreased safety awareness;Impaired sensation;Obesity;Impaired UE functional use      OT Treatment/Interventions: Self-care/ADL training;DME and/or AE instruction;Therapeutic activities;Visual/perceptual remediation/compensation;Patient/family education;Balance training    OT Goals(Current goals can be found in the care plan section) Acute Rehab OT Goals Patient Stated Goal: independence and return to active schedule OT Goal Formulation:  With patient/family(daughter) Time For Goal Achievement: 05/30/18 Potential to Achieve Goals: Good  OT Frequency: Min 3X/week   Barriers to D/C:            Co-evaluation              AM-PAC PT "6 Clicks" Daily Activity     Outcome Measure Help from another person eating meals?: None Help from another person taking care of personal grooming?: A Little Help from another person toileting, which includes using toliet, bedpan, or urinal?: A Little Help from another person bathing (including washing, rinsing, drying)?: A Little Help from another person to put on and taking off regular upper body clothing?: None Help from another person to put on and taking off regular lower body clothing?: A Little 6 Click Score: 20   End of Session Nurse Communication: Mobility status;Other (comment)(DC needs)  Activity Tolerance: Patient tolerated treatment well Patient left: in bed;with call bell/phone within reach;with family/visitor present  OT Visit Diagnosis: Unsteadiness on feet (R26.81);Low vision, both eyes (H54.2);Other (comment)(sensory deficits)                Time: 1020-1041 OT Time Calculation (min): 21 min Charges:  OT General Charges $OT Visit: 1 Visit OT Evaluation $OT Eval Moderate Complexity: [place], OT/L   Acute OT Clinical Specialist Acute Rehabilitation Services Pager [phone] Office [phone]   [facility] 05/16/2018, 10:49 AM

## 2018-05-16 NOTE — Progress Notes (Addendum)
   Subjective: Ms. Heather Rogers reported feeling not herself today. She felt weak in her right lower extremity and was still having blurry vision. She said it has been going on since Saturday but got worse. She denied any headaches or n/v. She was informed she had a stroke which was the cause of these symptoms. She said she lived at home with her niece who is mentally challenged. She was informed she should no longer drive due to her vision deficits. All questions and concerns were addressed.  Objective:  Vital signs in last 24 hours: Vitals:   05/15/18 1949 05/15/18 2337 05/16/18 0351 05/16/18 0757  BP: (!) 175/81 (!) 161/70 (!) 174/83 (!) 168/74  Pulse: 84  80 81  Resp: 18 16 16 16   Temp: 98.7 F (37.1 C) 98.7 F (37.1 C) 98 F (36.7 C) 99.1 F (37.3 C)  TempSrc: Oral Oral Oral Oral  SpO2: 98% 97% 100% 100%  Weight:      Height:       Physical Exam  Constitutional: She is oriented to person, place, and time and well-developed, well-nourished, and in no distress.  Eyes: Pupils are equal, round, and reactive to light. EOM are normal.  Right eye peripheral vision deficit  Cardiovascular: Normal rate, regular rhythm and normal heart sounds.  No murmur heard. Pulmonary/Chest: Effort normal and breath sounds normal. No respiratory distress. She has no wheezes.  Abdominal: Soft. Bowel sounds are normal. She exhibits no distension.  Neurological: She is alert and oriented to person, place, and time. No cranial nerve deficit.  Decreased sensation on right UE and LE compared to left, strength equal bilaterally UE and LE   Skin: Skin is warm and dry.    Assessment/Plan:  Active Problems:   Stroke ([place])  Heather Towle Purcellis a 72 y.o.female with PMHx significant for hypertension and diabetes was brought to ED via EMS from his ophthalmologist office with sudden onset right sided tingling and numbness and worsening of her right blurry vision.  Stroke - CT head showed left occipital stroke,  left thalamic lacunar. MRI showed L PCA occulusion and left PCA infarct - MRA head showed moderate to severe left ICA petrous and moderate right ICA cavernous segment stenosis and moderate mide BA stenosis - Echo showed EF of 69-62%, grade I diastolic dysfunction, normal LV size and no significant valvular abnormalities - US carotid showed bilateral ICA 1-39% stenosis, VA's antegrade  - Neurology recommended outpatient TEE and loop, continue aspirin 81 mg and plavix 75 mg x3 weeks, then aspirin alone - permissive HTN to normalize in 5-7 days - continue lipitor 80 mg at discharge  - Hgb A1c 10.9, goal < 7.0, will need to follow up with PCP - lipid panel showed triglycerides 175, HDL 27  Hypertension 174/83  - Holding home dose of antihypertensives for permissive hypertension. Normalize in 5-7 days  Diabetes - Hgb A1c 10.9  - needs to follow up with pcp for diabetic medication regimen adjustments  AKI. - repeat bmp showed Cr 0.98, close to baseline   Dispo: Anticipated discharge is today pending if patient can get the loop recorder before discharge  Mike Craze, DO 05/16/2018, 12:53 PM Pager: [phone]

## 2018-05-16 NOTE — Discharge Summary (Addendum)
Name: Heather Rogers MRN: [id number] DOB: 07-24-1946 72 y.o. PCP: Patient, No Pcp Per  Date of Admission: 05/15/2018  8:47 AM Date of Discharge: 05/16/2018 Attending Physician: Dr. Dareen Piano, [place]  Discharge Diagnosis: 1.  Left occipital stroke 2. Type II DM  Discharge Medications: Allergies as of 05/16/2018      Reactions   Sulfa Antibiotics    Itching      Medication List    STOP taking these medications   hydrALAZINE 25 MG tablet Commonly known as:  APRESOLINE     TAKE these medications   acetaminophen-codeine 300-30 MG tablet Commonly known as:  TYLENOL #3 Take 1-2 tablets by mouth every 6 (six) hours as needed for moderate pain.   allopurinol 100 MG tablet Commonly known as:  ZYLOPRIM Take 100 mg by mouth daily.   amLODipine 10 MG tablet Commonly known as:  NORVASC Take 1 tablet (10 mg total) by mouth daily. Start taking on:  05/23/2018 What changed:  These instructions start on 05/23/2018. If you are unsure what to do until then, ask your doctor or other care provider.   aspirin 81 MG EC tablet Take 1 tablet (81 mg total) by mouth daily.   atorvastatin 80 MG tablet Commonly known as:  LIPITOR Take 1 tablet (80 mg total) by mouth daily at 6 PM. What changed:    medication strength  how much to take  when to take this   carvedilol 12.5 MG tablet Commonly known as:  COREG Take 1 tablet (12.5 mg total) by mouth 2 (two) times daily with a meal. Start taking on:  05/23/2018 What changed:  These instructions start on 05/23/2018. If you are unsure what to do until then, ask your doctor or other care provider.   clopidogrel 75 MG tablet Commonly known as:  PLAVIX Take 1 tablet (75 mg total) by mouth daily for 21 days.   glipiZIDE 10 MG 24 hr tablet Commonly known as:  GLUCOTROL XL Take 10 mg by mouth daily with breakfast.   JANUMET XR [phone] MG Tb24 Generic drug:  SitaGLIPtin-MetFORMIN HCl Take 1 tablet by mouth daily.        Disposition and follow-up:   Ms.Jennings D Turrubiates was discharged from [facility] in Stable condition.  At the hospital follow up visit please address:  1.  Stroke-patient was started on lovastatin 80 mg, Plavix 75 mg, aspirin 81 mg and told to restart her antihypertensives in 1 week after discharge, please assess compliance  2. Diabetes-patient's hemoglobin A1c was 10.9 on admission, reassess diabetic medication regimen  2.  Labs / imaging needed at time of follow-up: None  3.  Pending labs/ test needing follow-up: None  Follow-up Appointments: [place].   Specialty:  Home Health Services Why:  Home Health Physical Therapy and Occupational Therapy Contact information: [place] [address] [place] [facility] up.   Why:  [facility] Contact information: [address] [address] [place] [facility] up on 05/20/2018.   Why:  Please arrive at the main entrance at 6:30am for your transesophageal echocardiogram procedure.  Contact information: [place] [phone] [place] [facility] Course by problem list: 1.  Left occipital stroke-  patient was brought to the ED via EMS from her ophthalmologist office with sudden onset right-sided numbness and tingling with worsening of right-sided blurry vision.  Her right blurry vision started 10/12 when she initially presented to the ED along with a headache and was found to have elevated blood pressure.  Normal CT head at that time and was treated as a hypertensive urgency. CT head at this time showed left occipital stroke. MRI/MRA head and neck showed left PCA occlusion and left PCA infarct as well as moderate to severe left ICA petrous and moderate right ICA cavernous segment stenosis and  moderate mid basilar artery stenosis; infarct of embolic etiology. Echo showed EF of 94-85%, grade I diastolic dysfunction, normal LV size and no significant valvular abnormalities. Recommended to continue aspirin and Plavix for 3 weeks and then transition to aspirin 81 mg alone.  Plans to continue antihypertensive medications in 1 week. Neurology also recommended outpatient TEE and loop recorder for paroxysmal A. fib.  Was also advised to not drive.  She is to follow-up on 10/22 for her TEE.  2.  Type 2 diabetes- hemoglobin A1c 10.9.  Patient reported she had not been taking any medications for the past 3 months because her PCP left.  Patient was given prescriptions for her diabetes medications on 10/12 when she initially presented to the ED.  Would recommend reassessing compliance and adjusting as needed.   Discharge Vitals:   BP (!) 209/86 (BP Location: Left Arm)   Pulse 85   Temp 99.1 F (37.3 C) (Oral)   Resp 17   Ht 5\' 2"  (1.575 m)   Wt 72.8 kg   SpO2 99%   BMI 29.35 kg/m   Pertinent Labs, Studies, and Procedures:   CBC Latest Ref Rng & Units 05/15/2018 05/15/2018 05/10/2018  WBC 4.0 - 10.5 K/uL - 6.6 4.8  Hemoglobin 12.0 - 15.0 g/dL 12.9 11.7(L) 11.9(L)  Hematocrit 36.0 - 46.0 % 38.0 38.6 39.7  Platelets 150 - 400 K/uL - 319 326   CMP Latest Ref Rng & Units 05/16/2018 05/15/2018 05/15/2018  Glucose 70 - 99 mg/dL 100(H) 256(H) 253(H)  BUN 8 - 23 mg/dL 15 19 16   Creatinine 0.44 - 1.00 mg/dL 0.98 1.00 1.06(H)  Sodium 135 - 145 mmol/L 139 138 135  Potassium 3.5 - 5.1 mmol/L 3.5 3.9 3.9  Chloride 98 - 111 mmol/L 110 108 106  CO2 22 - 32 mmol/L 18(L) - 20(L)  Calcium 8.9 - 10.3 mg/dL 9.9 - 9.4  Total Protein 6.5 - 8.1 g/dL - - 7.6  Total Bilirubin 0.3 - 1.2 mg/dL - - 0.4  Alkaline Phos 38 - 126 U/L - - 68  AST 15 - 41 U/L - - 23  ALT 0 - 44 U/L - - 18   Mr [facility] Neck W Wo Contrast  Addendum Date: 05/15/2018   ADDENDUM REPORT: 05/15/2018 16:53 ADDENDUM: Study discussed by  telephone with Dr. Shan Levans on 05/15/2018 at 1642 hours. Electronically Signed   By: Genevie Ann M.D.   On: 05/15/2018 16:53   Result Date: 05/15/2018 CLINICAL DATA:  72 year old female with left PCA infarct on code stroke head CT today performed for right side weakness, numbness, slurred speech. By report, visual symptoms began 5 days ago. EXAM: MRI HEAD WITHOUT CONTRAST MRA HEAD WITHOUT CONTRAST MRA NECK WITHOUT AND WITH CONTRAST TECHNIQUE: Multiplanar, multiecho pulse sequences of the brain and surrounding structures were obtained without intravenous contrast. Angiographic images of the Circle of Willis were obtained using MRA technique  without intravenous contrast. Angiographic images of the neck were obtained using MRA technique without and with intravenous contrast. Carotid stenosis measurements (when applicable) are obtained utilizing NASCET criteria, using the distal internal carotid diameter as the denominator. CONTRAST:  7 milliliters Gadavist COMPARISON:  Head CT at 0856 hours. FINDINGS: MRI HEAD FINDINGS Brain: Patchy restricted diffusion in the left PCA territory from the left hippocampus through to the medial left occipital pole (series 5 images 59 and 62. Associated cytotoxic edema with T2 and FLAIR hyperintensity but no mass effect. No convincing associated acute hemorrhage, although there are possibly occasional chronic micro hemorrhages in the brain (right posterior temporal lobe or insula on series 16, image 31. No other restricted diffusion. No midline shift, mass effect, evidence of mass lesion, ventriculomegaly, extra-axial collection or acute intracranial hemorrhage. Cervicomedullary junction and pituitary are within normal limits. Patchy bilateral cerebral white matter T2 and FLAIR hyperintensity but no chronic cortical encephalomalacia. There is faint T2 and FLAIR hyperintensity in the left thalamus although no restricted diffusion. This is superimposed on a chronic left thalamic lacune on  series 14, image 15. Mild patchy T2 hyperintensity in the pons. Negative cerebellum. Vascular: Major intracranial vascular flow voids are preserved. Skull and upper cervical spine: Negative for age, C5-C6 chronic disc and endplate degeneration. Visualized bone marrow signal is within normal limits. Sinuses/Orbits: Negative orbit soft tissues. Paranasal sinuses and mastoids are stable and well pneumatized. Other: Visible internal auditory structures appear normal. Scalp and face soft tissues appear negative. MRA NECK FINDINGS Precontrast time-of-flight images demonstrate antegrade flow in both cervical carotid and vertebral arteries to the skull base. There is irregularity at the left carotid bifurcation. Post-contrast neck MRA images reveal a 3 vessel arch configuration. The proximal common carotid arteries are tortuous. No great vessel origin stenosis. No right CCA stenosis. Right carotid bifurcation is patent without stenosis. Negative cervical right ICA. Tortuous left CCA without stenosis. At the left carotid bifurcation there is irregularity at the left ICA origin with stenosis estimated at 50 % with respect to the distal vessel. Note is made of moderate left ICA horizontal petrous segment stenosis on series 1007, image 6. The vertebral artery origins are within normal limits. The vertebral arteries are codominant and tortuous in the neck with no convincing stenosis. MRA HEAD FINDINGS Codominant distal vertebral arteries with antegrade flow to the basilar. Distal vertebral irregularity but no significant stenosis. The left PICA is patent. The right AICA appears dominant. The vertebrobasilar junction is fenestrated, normal variant. There is moderate mid basilar irregularity and stenosis, also evident on the post-contrast neck MRA images. The distal basilar is patent. SCA and right PCA origins are patent. The left PCA is occluded from the mid P1 through the proximal P2 segment with reconstituted flow signal  distally (series 1035, image 8). There is mild stenosis of the right PCA distal P1. Posterior communicating arteries are diminutive or absent. Antegrade flow in both ICA siphons. Moderate to severe stenosis in the left ICA or is on ule petrous segment corroborated with the Neck MRA images. A moderate degree of stenosis is seen at the proximal right ICA cavernous segment (series 9, image 78). The distal ICA siphons and ophthalmic artery origins are patent. Normal MCA and ACA origins. Diminutive or absent anterior communicating artery. Visible ACA branches are within normal limits. Both MCA bifurcations are patent without stenosis. Visible left MCA branches are within normal limits. There is mild irregularity of the visible right MCA branches. IMPRESSION: 1. Positive for segmental Left PCA  occlusion with acute to subacute patchy Left PCA infarct. Territory from the left hippocampus through the medial left occipital pole is affected with edema but no associated hemorrhage or mass effect. 2. Neck MRA is negative for hemodynamically significant stenosis in the neck (50% Left ICA origin stenosis), but Head MRA is positive for: - moderate to severe Left ICA petrous segment and moderate Right ICA cavernous segment stenoses. - moderate mid Basilar Artery stenosis. 3. Chronic small vessel ischemia in the left thalamus. Mild to moderate for age cerebral white matter signal changes also probably small vessel disease related. Electronically Signed: By: Genevie Ann M.D. On: 05/15/2018 16:19   Mr Brain Wo Contrast  Addendum Date: 05/15/2018   ADDENDUM REPORT: 05/15/2018 16:53 ADDENDUM: Study discussed by telephone with Dr. Shan Levans on 05/15/2018 at 1642 hours. Electronically Signed   By: Genevie Ann M.D.   On: 05/15/2018 16:53   Result Date: 05/15/2018 CLINICAL DATA:  72 year old female with left PCA infarct on code stroke head CT today performed for right side weakness, numbness, slurred speech. By report, visual symptoms began 5  days ago. EXAM: MRI HEAD WITHOUT CONTRAST MRA HEAD WITHOUT CONTRAST MRA NECK WITHOUT AND WITH CONTRAST TECHNIQUE: Multiplanar, multiecho pulse sequences of the brain and surrounding structures were obtained without intravenous contrast. Angiographic images of the Circle of Willis were obtained using MRA technique without intravenous contrast. Angiographic images of the neck were obtained using MRA technique without and with intravenous contrast. Carotid stenosis measurements (when applicable) are obtained utilizing NASCET criteria, using the distal internal carotid diameter as the denominator. CONTRAST:  7 milliliters Gadavist COMPARISON:  Head CT at 0856 hours. FINDINGS: MRI HEAD FINDINGS Brain: Patchy restricted diffusion in the left PCA territory from the left hippocampus through to the medial left occipital pole (series 5 images 59 and 62. Associated cytotoxic edema with T2 and FLAIR hyperintensity but no mass effect. No convincing associated acute hemorrhage, although there are possibly occasional chronic micro hemorrhages in the brain (right posterior temporal lobe or insula on series 16, image 31. No other restricted diffusion. No midline shift, mass effect, evidence of mass lesion, ventriculomegaly, extra-axial collection or acute intracranial hemorrhage. Cervicomedullary junction and pituitary are within normal limits. Patchy bilateral cerebral white matter T2 and FLAIR hyperintensity but no chronic cortical encephalomalacia. There is faint T2 and FLAIR hyperintensity in the left thalamus although no restricted diffusion. This is superimposed on a chronic left thalamic lacune on series 14, image 15. Mild patchy T2 hyperintensity in the pons. Negative cerebellum. Vascular: Major intracranial vascular flow voids are preserved. Skull and upper cervical spine: Negative for age, C5-C6 chronic disc and endplate degeneration. Visualized bone marrow signal is within normal limits. Sinuses/Orbits: Negative orbit  soft tissues. Paranasal sinuses and mastoids are stable and well pneumatized. Other: Visible internal auditory structures appear normal. Scalp and face soft tissues appear negative. MRA NECK FINDINGS Precontrast time-of-flight images demonstrate antegrade flow in both cervical carotid and vertebral arteries to the skull base. There is irregularity at the left carotid bifurcation. Post-contrast neck MRA images reveal a 3 vessel arch configuration. The proximal common carotid arteries are tortuous. No great vessel origin stenosis. No right CCA stenosis. Right carotid bifurcation is patent without stenosis. Negative cervical right ICA. Tortuous left CCA without stenosis. At the left carotid bifurcation there is irregularity at the left ICA origin with stenosis estimated at 50 % with respect to the distal vessel. Note is made of moderate left ICA horizontal petrous segment stenosis on series 1007, image  6. The vertebral artery origins are within normal limits. The vertebral arteries are codominant and tortuous in the neck with no convincing stenosis. MRA HEAD FINDINGS Codominant distal vertebral arteries with antegrade flow to the basilar. Distal vertebral irregularity but no significant stenosis. The left PICA is patent. The right AICA appears dominant. The vertebrobasilar junction is fenestrated, normal variant. There is moderate mid basilar irregularity and stenosis, also evident on the post-contrast neck MRA images. The distal basilar is patent. SCA and right PCA origins are patent. The left PCA is occluded from the mid P1 through the proximal P2 segment with reconstituted flow signal distally (series 1035, image 8). There is mild stenosis of the right PCA distal P1. Posterior communicating arteries are diminutive or absent. Antegrade flow in both ICA siphons. Moderate to severe stenosis in the left ICA or is on ule petrous segment corroborated with the Neck MRA images. A moderate degree of stenosis is seen at the  proximal right ICA cavernous segment (series 9, image 78). The distal ICA siphons and ophthalmic artery origins are patent. Normal MCA and ACA origins. Diminutive or absent anterior communicating artery. Visible ACA branches are within normal limits. Both MCA bifurcations are patent without stenosis. Visible left MCA branches are within normal limits. There is mild irregularity of the visible right MCA branches. IMPRESSION: 1. Positive for segmental Left PCA occlusion with acute to subacute patchy Left PCA infarct. Territory from the left hippocampus through the medial left occipital pole is affected with edema but no associated hemorrhage or mass effect. 2. Neck MRA is negative for hemodynamically significant stenosis in the neck (50% Left ICA origin stenosis), but Head MRA is positive for: - moderate to severe Left ICA petrous segment and moderate Right ICA cavernous segment stenoses. - moderate mid Basilar Artery stenosis. 3. Chronic small vessel ischemia in the left thalamus. Mild to moderate for age cerebral white matter signal changes also probably small vessel disease related. Electronically Signed: By: Genevie Ann M.D. On: 05/15/2018 16:19   Mr Jodene Nam Head Wo Contrast  Addendum Date: 05/15/2018   ADDENDUM REPORT: 05/15/2018 16:53 ADDENDUM: Study discussed by telephone with Dr. Shan Levans on 05/15/2018 at 1642 hours. Electronically Signed   By: Genevie Ann M.D.   On: 05/15/2018 16:53   Result Date: 05/15/2018 CLINICAL DATA:  72 year old female with left PCA infarct on code stroke head CT today performed for right side weakness, numbness, slurred speech. By report, visual symptoms began 5 days ago. EXAM: MRI HEAD WITHOUT CONTRAST MRA HEAD WITHOUT CONTRAST MRA NECK WITHOUT AND WITH CONTRAST TECHNIQUE: Multiplanar, multiecho pulse sequences of the brain and surrounding structures were obtained without intravenous contrast. Angiographic images of the Circle of Willis were obtained using MRA technique without  intravenous contrast. Angiographic images of the neck were obtained using MRA technique without and with intravenous contrast. Carotid stenosis measurements (when applicable) are obtained utilizing NASCET criteria, using the distal internal carotid diameter as the denominator. CONTRAST:  7 milliliters Gadavist COMPARISON:  Head CT at 0856 hours. FINDINGS: MRI HEAD FINDINGS Brain: Patchy restricted diffusion in the left PCA territory from the left hippocampus through to the medial left occipital pole (series 5 images 59 and 62. Associated cytotoxic edema with T2 and FLAIR hyperintensity but no mass effect. No convincing associated acute hemorrhage, although there are possibly occasional chronic micro hemorrhages in the brain (right posterior temporal lobe or insula on series 16, image 31. No other restricted diffusion. No midline shift, mass effect, evidence of mass lesion, ventriculomegaly, extra-axial  collection or acute intracranial hemorrhage. Cervicomedullary junction and pituitary are within normal limits. Patchy bilateral cerebral white matter T2 and FLAIR hyperintensity but no chronic cortical encephalomalacia. There is faint T2 and FLAIR hyperintensity in the left thalamus although no restricted diffusion. This is superimposed on a chronic left thalamic lacune on series 14, image 15. Mild patchy T2 hyperintensity in the pons. Negative cerebellum. Vascular: Major intracranial vascular flow voids are preserved. Skull and upper cervical spine: Negative for age, C5-C6 chronic disc and endplate degeneration. Visualized bone marrow signal is within normal limits. Sinuses/Orbits: Negative orbit soft tissues. Paranasal sinuses and mastoids are stable and well pneumatized. Other: Visible internal auditory structures appear normal. Scalp and face soft tissues appear negative. MRA NECK FINDINGS Precontrast time-of-flight images demonstrate antegrade flow in both cervical carotid and vertebral arteries to the skull  base. There is irregularity at the left carotid bifurcation. Post-contrast neck MRA images reveal a 3 vessel arch configuration. The proximal common carotid arteries are tortuous. No great vessel origin stenosis. No right CCA stenosis. Right carotid bifurcation is patent without stenosis. Negative cervical right ICA. Tortuous left CCA without stenosis. At the left carotid bifurcation there is irregularity at the left ICA origin with stenosis estimated at 50 % with respect to the distal vessel. Note is made of moderate left ICA horizontal petrous segment stenosis on series 1007, image 6. The vertebral artery origins are within normal limits. The vertebral arteries are codominant and tortuous in the neck with no convincing stenosis. MRA HEAD FINDINGS Codominant distal vertebral arteries with antegrade flow to the basilar. Distal vertebral irregularity but no significant stenosis. The left PICA is patent. The right AICA appears dominant. The vertebrobasilar junction is fenestrated, normal variant. There is moderate mid basilar irregularity and stenosis, also evident on the post-contrast neck MRA images. The distal basilar is patent. SCA and right PCA origins are patent. The left PCA is occluded from the mid P1 through the proximal P2 segment with reconstituted flow signal distally (series 1035, image 8). There is mild stenosis of the right PCA distal P1. Posterior communicating arteries are diminutive or absent. Antegrade flow in both ICA siphons. Moderate to severe stenosis in the left ICA or is on ule petrous segment corroborated with the Neck MRA images. A moderate degree of stenosis is seen at the proximal right ICA cavernous segment (series 9, image 78). The distal ICA siphons and ophthalmic artery origins are patent. Normal MCA and ACA origins. Diminutive or absent anterior communicating artery. Visible ACA branches are within normal limits. Both MCA bifurcations are patent without stenosis. Visible left MCA  branches are within normal limits. There is mild irregularity of the visible right MCA branches. IMPRESSION: 1. Positive for segmental Left PCA occlusion with acute to subacute patchy Left PCA infarct. Territory from the left hippocampus through the medial left occipital pole is affected with edema but no associated hemorrhage or mass effect. 2. Neck MRA is negative for hemodynamically significant stenosis in the neck (50% Left ICA origin stenosis), but Head MRA is positive for: - moderate to severe Left ICA petrous segment and moderate Right ICA cavernous segment stenoses. - moderate mid Basilar Artery stenosis. 3. Chronic small vessel ischemia in the left thalamus. Mild to moderate for age cerebral white matter signal changes also probably small vessel disease related. Electronically Signed: By: Genevie Ann M.D. On: 05/15/2018 16:19   Ct Head Code Stroke Wo Contrast  Result Date: 05/15/2018 CLINICAL DATA:  Code stroke. Right-sided weakness and numbness with slurred  speech EXAM: CT HEAD WITHOUT CONTRAST TECHNIQUE: Contiguous axial images were obtained from the base of the skull through the vertex without intravenous contrast. COMPARISON:  05/10/2018 FINDINGS: Brain: Cytotoxic edema appearance in the parasagittal left occipital lobe involving a moderate area. Infarct in the lateral left thalamus, also seen previously. Mild chronic small vessel ischemic type change in the cerebral white matter. No hemorrhage, hydrocephalus, or masslike finding Vascular: No hyperdense vessel. Skull: No acute or aggressive finding Sinuses/Orbits: Negative Other: These results were communicated to Dr. Cheral Marker at 9:21 [place] 10/17/2019by text page via the [facility] messaging system. ASPECTS [facility] Stroke Program Early CT Score) Not scored given the PCA infarct. IMPRESSION: 1. Moderate acute infarct in the left occipital lobe. 2. Lateral left thalamus lacune. Electronically Signed   By: Monte Fantasia M.D.   On: 05/15/2018 09:23    Discharge Instructions: Discharge Instructions    Diet - low sodium heart healthy   Complete by:  As directed    Increase activity slowly   Complete by:  As directed      Ms. Duer,  It was a pleasure taking care of you during your hospital stay. You were hospitalized for a left occipital stroke. We are recommending you start taking some new medications to prevent risk for another stroke.   Please start taking Plavix 75 mg and Aspirin 81 mg daily for 3 weeks, then continue taking just aspirin 81 mg daily. Start taking atorvastatin 80 mg daily.  Also, please restart your anti-hypertensive medications Amlodipine 10 mg and Carvedilol 12.5 mg daily, in 1 week, starting on 10/25. Please make sure to take your diabetes medications as well and follow up with your PCP in one week. Also your cardiology appointment is on 10/22, see below.   Transesophageal echocardiogram procedure: Scheduled for 05/20/18 at 8:00am with Dr. Fransico Him  Instructions: - Nothing to eat after midnight (Monday 10/21) the night before your procedure. You may take your medications with a sip of water the morning of your procedure. You can hold your oral diabetes medications on the morning of your procedure to limit low blood sugar.  - Arrive to [facility]	[place] admitting at 6:30am prior to scheduled procedure time of 8:00am - You will need a responsible adult to drive patient home   Signed: Mike Craze, DO 05/17/2018, 12:38 PM   Pager: [phone]

## 2018-05-16 NOTE — Progress Notes (Signed)
Date: 05/16/2018  Patient name: Heather Rogers  Medical record number: [id number]  Date of birth: 1946/03/17   I have seen and evaluated [name] and discussed their care with the Residency Team.  In brief, patient is a 72 year old female with a past medical history of hypertension and diabetes who presented to the ED from the ophthalmologist office with sudden onset of right-sided tingling and numbness as well as worsening of her right-sided blurry vision.  Patient states that 6 days ago she developed sudden onset of blurry vision and went to the ED to be evaluated.  In the ED she she had a CT head which was normal at the time.  She was noted to have elevated blood pressures and had been out of her home medications for approximately 3 months.  She was treated as a hypertensive urgency and discharged home.  Patient continued to have some persistent blurry vision despite improving blood pressures at home and went to the ophthalmologist office yesterday.  While at the ophthalmologist office she noted sudden onset of tingling and numbness over her right upper and lower extremity as well as worsening of her right-sided blurry vision.  Patient was sent to the ED for further evaluation.  Today patient states that she still has persistent visual difficulties especially out of her right eye as well and has persistent tingling and numbness in her right lower extremity.  PMHx, Fam Hx, and/or Soc Hx : As per resident admit note  Vitals:   05/16/18 0757 05/16/18 1248  BP: (!) 168/74 (!) 181/77  Pulse: 81 78  Resp: 16 17  Temp: 99.1 F (37.3 C)   SpO2: 100% 99%   General: Awake, alert, oriented x3, NAD CVS: Regular rate and rhythm, normal heart sounds Lungs: CTA bilaterally Abdomen: Soft, nontender, nondistended, normoactive bowel sounds Extremities: No edema noted Neuro: Strength is 5 out of 5 bilateral upper and lower extremities.  Sensation is decreased in her right upper and lower  extremities compared to her left.  Patient is also noted to have right-sided peripheral visual field defect   Assessment and Plan: I have seen and evaluated the patient as outlined above. I agree with the formulated Assessment and Plan as detailed in the residents' note, with the following changes:    Aldine Contes, MD 10/18/20193:06 PM  1.  Acute CVA: -Patient presented to ED with worsening right-sided blurry vision as well as tingling and numbness over her right upper and lower extremity.  CT head done in the ED showed left occipital stroke.  Patient was admitted for evaluation of an acute CVA -MRI/MRA head and neck showed left PCA occlusion and left PCA infarct as well as moderate to severe left ICA petrous and moderate right ICA cavernous segment stenosis and moderate mid basilar artery stenosis -Continue with aspirin Plavix for 3 weeks and then transition to aspirin 81 mg alone -Case discussed with Dr. Leonie Man at bedside this morning.  It is likely that the source of her CVA was embolic etiology.  Patient will need a TEE to look for left atrial appendage clot as well as loop recorder placement to diagnose possible occult A. Fib -Cardiology follow-up and recommendations appreciated.  Patient to have TEE done as an outpatient on 05/20/2018 -We will allow for permissive hypertension for now and gradually reduce her blood pressure to normal over the next 5 to 7 days -Patient will need better control of her blood pressure as well as her diabetes (her last A1c was 10.9) -  Continue with Lipitor 80 mg at discharge -Carotid ultrasound showed bilateral ICA 1 to 39% stenosis -No further work-up at this time -Patient is stable for discharge home today pending scheduling of her loop recorder placement.  We discussed in detail that the patient should not drive anymore given her persistent right-sided visual field defect.

## 2018-05-20 ENCOUNTER — Ambulatory Visit (HOSPITAL_COMMUNITY)
Admission: RE | Admit: 2018-05-20 | Discharge: 2018-05-20 | Disposition: A | Discharge status: Home / Self Care | Payer: Medicare HMO | Source: Ambulatory Visit | Attending: Cardiology | Admitting: Cardiology

## 2018-05-20 ENCOUNTER — Encounter (HOSPITAL_COMMUNITY)
Admission: RE | Disposition: A | Discharge status: Home / Self Care | Payer: Self-pay | Source: Ambulatory Visit | Attending: Cardiology

## 2018-05-20 ENCOUNTER — Encounter (HOSPITAL_COMMUNITY): Payer: Self-pay

## 2018-05-20 ENCOUNTER — Ambulatory Visit (HOSPITAL_BASED_OUTPATIENT_CLINIC_OR_DEPARTMENT_OTHER)
Admission: RE | Admit: 2018-05-20 | Discharge: 2018-05-20 | Disposition: A | Discharge status: Home / Self Care | Payer: Medicare HMO | Source: Ambulatory Visit | Attending: Medical | Admitting: Medical

## 2018-05-20 ENCOUNTER — Other Ambulatory Visit: Payer: Self-pay

## 2018-05-20 DIAGNOSIS — N179 Acute kidney failure, unspecified: Secondary | ICD-10-CM | POA: Insufficient documentation

## 2018-05-20 DIAGNOSIS — E119 Type 2 diabetes mellitus without complications: Secondary | ICD-10-CM | POA: Diagnosis not present

## 2018-05-20 DIAGNOSIS — I160 Hypertensive urgency: Secondary | ICD-10-CM | POA: Insufficient documentation

## 2018-05-20 DIAGNOSIS — I340 Nonrheumatic mitral (valve) insufficiency: Secondary | ICD-10-CM | POA: Diagnosis not present

## 2018-05-20 DIAGNOSIS — I639 Cerebral infarction, unspecified: Secondary | ICD-10-CM | POA: Diagnosis present

## 2018-05-20 DIAGNOSIS — Z87891 Personal history of nicotine dependence: Secondary | ICD-10-CM | POA: Diagnosis not present

## 2018-05-20 DIAGNOSIS — Z882 Allergy status to sulfonamides status: Secondary | ICD-10-CM | POA: Insufficient documentation

## 2018-05-20 DIAGNOSIS — I10 Essential (primary) hypertension: Secondary | ICD-10-CM | POA: Insufficient documentation

## 2018-05-20 DIAGNOSIS — H538 Other visual disturbances: Secondary | ICD-10-CM | POA: Insufficient documentation

## 2018-05-20 DIAGNOSIS — Z7984 Long term (current) use of oral hypoglycemic drugs: Secondary | ICD-10-CM | POA: Insufficient documentation

## 2018-05-20 DIAGNOSIS — I6389 Other cerebral infarction: Secondary | ICD-10-CM

## 2018-05-20 HISTORY — PX: LOOP RECORDER INSERTION: EP1214

## 2018-05-20 HISTORY — PX: TEE WITHOUT CARDIOVERSION: SHX5443

## 2018-05-20 HISTORY — DX: Cerebral infarction, unspecified: I63.9

## 2018-05-20 LAB — GLUCOSE, CAPILLARY: Glucose-Capillary: 126 mg/dL — ABNORMAL HIGH (ref 70–99)

## 2018-05-20 SURGERY — LOOP RECORDER INSERTION

## 2018-05-20 SURGERY — ECHOCARDIOGRAM, TRANSESOPHAGEAL
Anesthesia: Moderate Sedation

## 2018-05-20 MED ORDER — FENTANYL CITRATE (PF) 100 MCG/2ML IJ SOLN
INTRAMUSCULAR | Status: AC
  Filled 2018-05-20: qty 2

## 2018-05-20 MED ORDER — SODIUM CHLORIDE 0.9 % IV SOLN
INTRAVENOUS | Status: DC
  Administered 2018-05-20: 500 mL via INTRAVENOUS

## 2018-05-20 MED ORDER — LIDOCAINE-EPINEPHRINE 1 %-1:100000 IJ SOLN
INTRAMUSCULAR | Status: AC
  Filled 2018-05-20: qty 1

## 2018-05-20 MED ORDER — MIDAZOLAM HCL 5 MG/ML IJ SOLN
INTRAMUSCULAR | Status: AC
  Filled 2018-05-20: qty 2

## 2018-05-20 MED ORDER — LIDOCAINE-EPINEPHRINE 1 %-1:100000 IJ SOLN
INTRAMUSCULAR | Status: DC | PRN
  Administered 2018-05-20: 20 mL

## 2018-05-20 MED ORDER — FENTANYL CITRATE (PF) 100 MCG/2ML IJ SOLN
INTRAMUSCULAR | Status: DC | PRN
  Administered 2018-05-20: 25 ug via INTRAVENOUS

## 2018-05-20 MED ORDER — BUTAMBEN-TETRACAINE-BENZOCAINE 2-2-14 % EX AERO
INHALATION_SPRAY | CUTANEOUS | Status: DC | PRN
  Administered 2018-05-20: 2 via TOPICAL

## 2018-05-20 MED ORDER — MIDAZOLAM HCL 10 MG/2ML IJ SOLN
INTRAMUSCULAR | Status: DC | PRN
  Administered 2018-05-20: 2 mg via INTRAVENOUS

## 2018-05-20 SURGICAL SUPPLY — 2 items
LOOP REVEAL LINQSYS (Prosthesis & Implant Heart) ×3 IMPLANT
PACK LOOP INSERTION (CUSTOM PROCEDURE TRAY) ×3 IMPLANT

## 2018-05-20 NOTE — H&P (View-Only) (Signed)
ELECTROPHYSIOLOGY CONSULT NOTE    Primary Care Physician: Patient, No Pcp Per Referring Physician:   Dr Leonie Man  Admit Date: 05/20/2018  Reason for consultation:  stroke  Heather Rogers is a 72 y.o. female with a h/o HTN and diabetes who presents for consultation regarding possible arrhythmias as the cause of her stroke by Dr Leonie Man.  She presented 05/15/18 with acute stroke.  She was evaluated and found to have a subacute L occipital stroke.  She was observed on telemetry and no causes were found for her stroke.  She is referred for consideration of ILR and TEE.  Today, she denies symptoms of palpitations, chest pain, shortness of breath, orthopnea, PND, lower extremity edema, dizziness, presyncope, syncope, or neurologic sequela. The patient is tolerating medications without difficulties and is otherwise without complaint today.   Past Medical History:  Diagnosis Date  . Diabetes ([place])   . Hypertension   . Stroke (cerebrum) [facility])    Past Surgical History:  Procedure Laterality Date  . hysterectomy       . sodium chloride 500 mL (05/20/18 0725)    Allergies  Allergen Reactions  . Sulfa Antibiotics     Itching     Social History   Socioeconomic History  . Marital status: Divorced    Spouse name: Not on file  . Number of children: Not on file  . Years of education: Not on file  . Highest education level: Not on file  Occupational History  . Not on file  Social Needs  . Financial resource strain: Not on file  . Food insecurity:    Worry: Not on file    Inability: Not on file  . Transportation needs:    Medical: Not on file    Non-medical: Not on file  Tobacco Use  . Smoking status: Former Research scientist (life sciences)  . Smokeless tobacco: Never Used  Substance and Sexual Activity  . Alcohol use: Never    Frequency: Never  . Drug use: Not on file  . Sexual activity: Not on file  Lifestyle  . Physical activity:    Days per week: Not on file    Minutes per session: Not on file    . Stress: Not on file  Relationships  . Social connections:    Talks on phone: Not on file    Gets together: Not on file    Attends religious service: Not on file    Active member of club or organization: Not on file    Attends meetings of clubs or organizations: Not on file    Relationship status: Not on file  . Intimate partner violence:    Fear of current or ex partner: Not on file    Emotionally abused: Not on file    Physically abused: Not on file    Forced sexual activity: Not on file  Other Topics Concern  . Not on file  Social History Narrative   Lives in [place].    Family History  Problem Relation Age of Onset  . Hypertension Mother   . Hypertension Father     ROS- All systems are reviewed and negative except as per the HPI above  Physical Exam: Telemetry: sinus Vitals:   05/20/18 0649 05/20/18 0707  BP:  (!) 219/92  Pulse:  85  Resp:  18  Temp:  98.3 F (36.8 C)  TempSrc:  Oral  SpO2:  100%  Weight: 72.8 kg   Height: 5\' 2"  (1.575 m)     GEN- The  patient is well appearing, alert and oriented x 3 today.   Head- normocephalic, atraumatic Eyes-  Sclera clear, conjunctiva pink Ears- hearing intact Oropharynx- clear Neck- supple, no JVP Lymph- no cervical lymphadenopathy Lungs- Clear to ausculation bilaterally, normal work of breathing Heart- Regular rate and rhythm, no murmurs, rubs or gallops, PMI not laterally displaced GI- soft, NT, ND, + BS Extremities- no clubbing, cyanosis, or edema MS- no significant deformity or atrophy Skin- no rash or lesion Psych- euthymic mood, full affect Neuro- strength and sensation are intact  EKG-  Sinus rhythm  Labs:   Lab Results  Component Value Date   WBC 6.6 05/15/2018   HGB 12.9 05/15/2018   HCT 38.0 05/15/2018   MCV 73.9 (L) 05/15/2018   PLT 319 05/15/2018    Recent Labs  Lab 05/15/18 0854  05/16/18 0420  NA 135   < > 139  K 3.9   < > 3.5  CL 106   < > 110  CO2 20*  --  18*  BUN 16   < >  15  CREATININE 1.06*   < > 0.98  CALCIUM 9.4  --  9.9  PROT 7.6  --   --   BILITOT 0.4  --   --   ALKPHOS 68  --   --   ALT 18  --   --   AST 23  --   --   GLUCOSE 253*   < > 100*   < > = values in this interval not displayed.   No results found for: CKTOTAL, CKMB, CKMBINDEX, TROPONINI  Lab Results  Component Value Date   CHOL 126 05/16/2018   Lab Results  Component Value Date   HDL 27 (L) 05/16/2018   Lab Results  Component Value Date   LDLCALC 64 05/16/2018   Lab Results  Component Value Date   TRIG 175 (H) 05/16/2018   Lab Results  Component Value Date   CHOLHDL 4.7 05/16/2018   No results found for: LDLDIRECT     Echo:  EF 55%, mild LA enlargement  ASSESSMENT AND PLAN:    1. Cryptogenic stroke The patient recently presented with cryptogenic stroke.  The patient has a TEE planned for this AM.  I spoke at length with the patient about indication of an implantable loop recorder.  Risks, benefits, and alteratives to implantable loop recorder were discussed with the patient today.   At this time, the patient is very clear in their decision to proceed with implantable loop recorder.   We will therefore plan to proceed with ILR pending results of her TEE by Dr Radford Pax this am.   2. HTN Stable No change required today   Thompson Grayer, MD 05/20/2018  7:46 AM

## 2018-05-20 NOTE — CV Procedure (Signed)
    PROCEDURE NOTE:  Procedure:  Transesophageal echocardiogram Operator:  Fransico Him, MD Indications:  CVA Complications: None  During this procedure the patient is administered a total of Versed 2 mg and Fentanyl 25 mg to achieve and maintain moderate conscious sedation.  The patient's heart rate, blood pressure, and oxygen saturation are monitored continuously during the procedure. The period of conscious sedation is 10 minutes, of which I was present face-to-face 100% of this time.  Results: Normal LV size and function Normal RV size and function Normal RA Mildlly dilated LA with normal LAA and no evidence of thrombus Normal TV with mild TR Normal PV with trivial PR Normal MV with mild MR Mildly thickened trileaflet AV Hypermobile interatrial septum with no evidence of shunt by colorflow dopper or agitated saline contrast injection Mild to moderate atherosclerosis of the thoracic and ascending aorta.  The patient tolerated the procedure well and was transferred back to their room in stable condition.  Signed: Fransico Him, MD [facility]

## 2018-05-20 NOTE — Progress Notes (Signed)
  Echocardiogram 2D Echocardiogram has been performed.  Jannett Celestine 05/20/2018, 8:42 AM

## 2018-05-20 NOTE — Consult Note (Addendum)
ELECTROPHYSIOLOGY CONSULT NOTE    Primary Care Physician: Patient, No Pcp Per Referring Physician:   Dr Leonie Man  Admit Date: 05/20/2018  Reason for consultation:  stroke  Heather Rogers is a 72 y.o. female with a h/o HTN and diabetes who presents for consultation regarding possible arrhythmias as the cause of her stroke by Dr Leonie Man.  She presented 05/15/18 with acute stroke.  She was evaluated and found to have a subacute L occipital stroke.  She was observed on telemetry and no causes were found for her stroke.  She is referred for consideration of ILR and TEE.  Today, she denies symptoms of palpitations, chest pain, shortness of breath, orthopnea, PND, lower extremity edema, dizziness, presyncope, syncope, or neurologic sequela. The patient is tolerating medications without difficulties and is otherwise without complaint today.   Past Medical History:  Diagnosis Date  . Diabetes ([place])   . Hypertension   . Stroke (cerebrum) [facility])    Past Surgical History:  Procedure Laterality Date  . hysterectomy       . sodium chloride 500 mL (05/20/18 0725)    Allergies  Allergen Reactions  . Sulfa Antibiotics     Itching     Social History   Socioeconomic History  . Marital status: Divorced    Spouse name: Not on file  . Number of children: Not on file  . Years of education: Not on file  . Highest education level: Not on file  Occupational History  . Not on file  Social Needs  . Financial resource strain: Not on file  . Food insecurity:    Worry: Not on file    Inability: Not on file  . Transportation needs:    Medical: Not on file    Non-medical: Not on file  Tobacco Use  . Smoking status: Former Research scientist (life sciences)  . Smokeless tobacco: Never Used  Substance and Sexual Activity  . Alcohol use: Never    Frequency: Never  . Drug use: Not on file  . Sexual activity: Not on file  Lifestyle  . Physical activity:    Days per week: Not on file    Minutes per session: Not on file    . Stress: Not on file  Relationships  . Social connections:    Talks on phone: Not on file    Gets together: Not on file    Attends religious service: Not on file    Active member of club or organization: Not on file    Attends meetings of clubs or organizations: Not on file    Relationship status: Not on file  . Intimate partner violence:    Fear of current or ex partner: Not on file    Emotionally abused: Not on file    Physically abused: Not on file    Forced sexual activity: Not on file  Other Topics Concern  . Not on file  Social History Narrative   Lives in [place].    Family History  Problem Relation Age of Onset  . Hypertension Mother   . Hypertension Father     ROS- All systems are reviewed and negative except as per the HPI above  Physical Exam: Telemetry: sinus Vitals:   05/20/18 0649 05/20/18 0707  BP:  (!) 219/92  Pulse:  85  Resp:  18  Temp:  98.3 F (36.8 C)  TempSrc:  Oral  SpO2:  100%  Weight: 72.8 kg   Height: 5\' 2"  (1.575 m)     GEN- The  patient is well appearing, alert and oriented x 3 today.   Head- normocephalic, atraumatic Eyes-  Sclera clear, conjunctiva pink Ears- hearing intact Oropharynx- clear Neck- supple, no JVP Lymph- no cervical lymphadenopathy Lungs- Clear to ausculation bilaterally, normal work of breathing Heart- Regular rate and rhythm, no murmurs, rubs or gallops, PMI not laterally displaced GI- soft, NT, ND, + BS Extremities- no clubbing, cyanosis, or edema MS- no significant deformity or atrophy Skin- no rash or lesion Psych- euthymic mood, full affect Neuro- strength and sensation are intact  EKG-  Sinus rhythm  Labs:   Lab Results  Component Value Date   WBC 6.6 05/15/2018   HGB 12.9 05/15/2018   HCT 38.0 05/15/2018   MCV 73.9 (L) 05/15/2018   PLT 319 05/15/2018    Recent Labs  Lab 05/15/18 0854  05/16/18 0420  NA 135   < > 139  K 3.9   < > 3.5  CL 106   < > 110  CO2 20*  --  18*  BUN 16   < >  15  CREATININE 1.06*   < > 0.98  CALCIUM 9.4  --  9.9  PROT 7.6  --   --   BILITOT 0.4  --   --   ALKPHOS 68  --   --   ALT 18  --   --   AST 23  --   --   GLUCOSE 253*   < > 100*   < > = values in this interval not displayed.   No results found for: CKTOTAL, CKMB, CKMBINDEX, TROPONINI  Lab Results  Component Value Date   CHOL 126 05/16/2018   Lab Results  Component Value Date   HDL 27 (L) 05/16/2018   Lab Results  Component Value Date   LDLCALC 64 05/16/2018   Lab Results  Component Value Date   TRIG 175 (H) 05/16/2018   Lab Results  Component Value Date   CHOLHDL 4.7 05/16/2018   No results found for: LDLDIRECT     Echo:  EF 55%, mild LA enlargement  ASSESSMENT AND PLAN:    1. Cryptogenic stroke The patient recently presented with cryptogenic stroke.  The patient has a TEE planned for this AM.  I spoke at length with the patient about indication of an implantable loop recorder.  Risks, benefits, and alteratives to implantable loop recorder were discussed with the patient today.   At this time, the patient is very clear in their decision to proceed with implantable loop recorder.   We will therefore plan to proceed with ILR pending results of her TEE by Dr Radford Pax this am.   2. HTN Stable No change required today   Thompson Grayer, MD 05/20/2018  7:46 AM

## 2018-05-20 NOTE — Interval H&P Note (Signed)
History and Physical Interval Note:  05/20/2018 7:49 AM  Heather Rogers  has presented today for surgery, with the diagnosis of stroke  The various methods of treatment have been discussed with the patient and family. After consideration of risks, benefits and other options for treatment, the patient has consented to  Procedure(s): LOOP RECORDER INSERTION (N/A) as a surgical intervention .  The patient's history has been reviewed, patient examined, no change in status, stable for surgery.  I have reviewed the patient's chart and labs.  Questions were answered to the patient's satisfaction.     Thompson Grayer

## 2018-05-20 NOTE — Interval H&P Note (Signed)
History and Physical Interval Note:  05/20/2018 7:56 AM  Heather Rogers  has presented today for surgery, with the diagnosis of STROKE EVALUATION  The various methods of treatment have been discussed with the patient and family. After consideration of risks, benefits and other options for treatment, the patient has consented to  Procedure(s): TRANSESOPHAGEAL ECHOCARDIOGRAM (TEE) (N/A) as a surgical intervention .  The patient's history has been reviewed, patient examined, no change in status, stable for surgery.  I have reviewed the patient's chart and labs.  Questions were answered to the patient's satisfaction.     Fransico Him

## 2018-05-20 NOTE — Discharge Instructions (Signed)
Post implant site care instructions Keep incision clean and dry for 3 days. You can remove outer dressing tomorrow. Leave steri-strips (little pieces of tape) on until seen in the office for wound check appointment. Call the office [phone]) for redness, drainage, swelling, or fever.   TEE  YOU HAD AN CARDIAC PROCEDURE TODAY: Refer to the procedure report and other information in the discharge instructions given to you for any specific questions about what was found during the examination. If this information does not answer your questions, please call [facility] office at [phone] to clarify.   DIET: Your first meal following the procedure should be a light meal and then it is ok to progress to your normal diet. A half-sandwich or bowl of soup is an example of a good first meal. Heavy or fried foods are harder to digest and may make you feel nauseous or bloated. Drink plenty of fluids but you should avoid alcoholic beverages for 24 hours. If you had a esophageal dilation, please see attached instructions for diet.   ACTIVITY: Your care partner should take you home directly after the procedure. You should plan to take it easy, moving slowly for the rest of the day. You can resume normal activity the day after the procedure however YOU SHOULD NOT DRIVE, use power tools, machinery or perform tasks that involve climbing or major physical exertion for 24 hours (because of the sedation medicines used during the test).   SYMPTOMS TO REPORT IMMEDIATELY: A cardiologist can be reached at any hour. Please call [phone] for any of the following symptoms:  Vomiting of blood or coffee ground material  New, significant abdominal pain  New, significant chest pain or pain under the shoulder blades  Painful or persistently difficult swallowing  New shortness of breath  Black, tarry-looking or red, bloody stools  FOLLOW UP:  Please also call with any specific questions about appointments or  follow up tests.

## 2018-05-21 ENCOUNTER — Encounter (HOSPITAL_COMMUNITY): Payer: Self-pay | Admitting: Cardiology

## 2018-05-27 ENCOUNTER — Ambulatory Visit: Payer: Medicare HMO | Attending: Family Medicine | Admitting: Physician Assistant

## 2018-05-27 VITALS — BP 182/80 | HR 72 | Temp 98.1°F | Resp 18 | Ht 62.0 in | Wt 158.0 lb

## 2018-05-27 DIAGNOSIS — Z8673 Personal history of transient ischemic attack (TIA), and cerebral infarction without residual deficits: Secondary | ICD-10-CM | POA: Insufficient documentation

## 2018-05-27 DIAGNOSIS — I10 Essential (primary) hypertension: Secondary | ICD-10-CM | POA: Insufficient documentation

## 2018-05-27 DIAGNOSIS — E1140 Type 2 diabetes mellitus with diabetic neuropathy, unspecified: Secondary | ICD-10-CM | POA: Insufficient documentation

## 2018-05-27 DIAGNOSIS — I639 Cerebral infarction, unspecified: Secondary | ICD-10-CM

## 2018-05-27 DIAGNOSIS — Z8249 Family history of ischemic heart disease and other diseases of the circulatory system: Secondary | ICD-10-CM | POA: Insufficient documentation

## 2018-05-27 DIAGNOSIS — Z7984 Long term (current) use of oral hypoglycemic drugs: Secondary | ICD-10-CM | POA: Insufficient documentation

## 2018-05-27 DIAGNOSIS — Z09 Encounter for follow-up examination after completed treatment for conditions other than malignant neoplasm: Secondary | ICD-10-CM | POA: Insufficient documentation

## 2018-05-27 DIAGNOSIS — Z79899 Other long term (current) drug therapy: Secondary | ICD-10-CM | POA: Insufficient documentation

## 2018-05-27 DIAGNOSIS — Z7982 Long term (current) use of aspirin: Secondary | ICD-10-CM | POA: Insufficient documentation

## 2018-05-27 DIAGNOSIS — E785 Hyperlipidemia, unspecified: Secondary | ICD-10-CM | POA: Insufficient documentation

## 2018-05-27 DIAGNOSIS — E1141 Type 2 diabetes mellitus with diabetic mononeuropathy: Secondary | ICD-10-CM | POA: Diagnosis not present

## 2018-05-27 DIAGNOSIS — Z7902 Long term (current) use of antithrombotics/antiplatelets: Secondary | ICD-10-CM | POA: Insufficient documentation

## 2018-05-27 DIAGNOSIS — Z90710 Acquired absence of both cervix and uterus: Secondary | ICD-10-CM | POA: Insufficient documentation

## 2018-05-27 MED ORDER — SITAGLIP PHOS-METFORMIN HCL ER 100-1000 MG PO TB24: 1.0000 | ORAL_TABLET | Freq: Every day | ORAL | 3 refills | Status: DC

## 2018-05-27 MED ORDER — CARVEDILOL 25 MG PO TABS: 25.0000 mg | ORAL_TABLET | Freq: Two times a day (BID) | ORAL | 3 refills | Status: DC

## 2018-05-27 MED ORDER — GABAPENTIN 100 MG PO CAPS: 100.0000 mg | ORAL_CAPSULE | Freq: Three times a day (TID) | ORAL | 3 refills | Status: DC

## 2018-05-27 NOTE — Progress Notes (Signed)
Keyra Virella  NOB:[id number]  HUT:[id number]  DOB - [birth year]  Chief Complaint  Patient presents with  . Hospitalization Follow-up       Subjective:   Heather Rogers is a 72 y.o. female here today for establishment of care.  She has a past medical history of diabetes mellitus and hypertension.  She lost her primary care provider several months ago and wants her refills were out she stopped taking all of her medications for diabetes and blood pressure.  She was brought to the ED via EMS from her ophthalmologist office with sudden onset right-sided numbness and tingling with worsening of right-sided blurry vision.  Her right blurry vision started 10/12 when she initially presented to the ED along with a headache and was found to have elevated blood pressure. Normal CT head at that time and was treated as a hypertensive urgency. CT head at this time showed left occipital stroke. This time MRI/MRA head and neck showed left PCA occlusion and left PCA infarct as well as moderate to severe left ICA petrous and moderate right ICA cavernous segment stenosis and moderate mid basilar artery stenosis; infarct of embolic etiology. Echo showed EF of 65-68%, grade I diastolic dysfunction, normal LV size and no significant valvular abnormalities. Recommended to continue aspirin and Plavix for 3 weeks and then transition to aspirin 81 mg alone.  She is enrolled in a stroke study and has an upcoming appointment on November 11th . Plans to continue antihypertensive medications in 1 week which was October 25.  She return for TEE and loop recorder implant on 05/20/2018 by cardiology.  There was no evidence of shunting or thrombus per the TEE.  She is having some numbness in the bottom of her feet.  She still has loss of peripheral vision on both sides.  She has short-term memory loss.  No headaches.  She has been compliant with her medications.  Her daughter make sure that she takes all of her medication.   She is lost a couple of pounds due to her eating better.  Still has a little education that is needed in regards to her diabetes and blood sugar.  She is out of Janumet.  She has not been driving as instructed.  She has a wound check with cardiology on November 6th.   ROS: GEN: denies fever or chills, denies change in weight Skin: denies lesions or rashes HEENT: denies headache, earache, epistaxis, sore throat, or neck pain; loss of peripheral vision LUNGS: denies SHOB, dyspnea, PND, orthopnea CV: denies CP or palpitations ABD: denies abd pain, N or V EXT: denies muscle spasms or swelling; no pain in lower ext, no weakness NEURO: +numbness or tingling in bilateral feet, denies sz, stroke or TIA  ALLERGIES: Allergies  Allergen Reactions  . Sulfa Antibiotics     Itching     PAST MEDICAL HISTORY: Past Medical History:  Diagnosis Date  . Diabetes ([place])   . Hypertension   . Stroke (cerebrum) ([place])     PAST SURGICAL HISTORY: Past Surgical History:  Procedure Laterality Date  . hysterectomy    . LOOP RECORDER INSERTION N/A 05/20/2018   Procedure: LOOP RECORDER INSERTION;  Surgeon: Thompson Grayer, MD;  Location: [place] [facility];  Service: Cardiovascular;  Laterality: N/A;  . TEE WITHOUT CARDIOVERSION N/A 05/20/2018   Procedure: TRANSESOPHAGEAL ECHOCARDIOGRAM (TEE);  Surgeon: Sueanne Margarita, MD;  Location: [facility] ENDOSCOPY;  Service: Cardiovascular;  Laterality: N/A;    MEDICATIONS AT HOME: Prior to Admission medications  Medication Sig Start Date End Date Taking? Authorizing Provider  allopurinol (ZYLOPRIM) 100 MG tablet Take 100 mg by mouth daily.   Yes [provider]  amLODipine (NORVASC) 10 MG tablet Take 1 tablet (10 mg total) by mouth daily. 05/23/18  Yes Rehman, Areeg N, DO  aspirin 81 MG EC tablet Take 1 tablet (81 mg total) by mouth daily. 05/17/18  Yes Rehman, Areeg N, DO  atorvastatin (LIPITOR) 80 MG tablet Take 1 tablet (80 mg total) by mouth daily at 6 PM.  05/16/18 06/15/18 Yes Rehman, Areeg N, DO  carvedilol (COREG) 25 MG tablet Take 1 tablet (25 mg total) by mouth 2 (two) times daily with a meal. 05/27/18  Yes Ena Dawley, Kayley Zeiders S, PA-C  clopidogrel (PLAVIX) 75 MG tablet Take 1 tablet (75 mg total) by mouth daily for 21 days. 05/17/18 06/07/18 Yes Rehman, Areeg N, DO  glipiZIDE (GLUCOTROL XL) 10 MG 24 hr tablet Take 10 mg by mouth daily with breakfast.   Yes [provider]  SitaGLIPtin-MetFORMIN HCl (JANUMET XR) [phone] MG TB24 Take 1 tablet by mouth daily. 05/27/18  Yes Ena Dawley, Shawni Volkov S, PA-C  gabapentin (NEURONTIN) 100 MG capsule Take 1 capsule (100 mg total) by mouth 3 (three) times daily. 05/27/18   Brayton Caves, PA-C    Family History  Problem Relation Age of Onset  . Hypertension Mother   . Hypertension Father    Social-unmarried, children, retired from [employer], nonsmoker  Objective:   Vitals:   05/27/18 1056  BP: (!) 182/80  Pulse: 72  Resp: 18  Temp: 98.1 F (36.7 C)  TempSrc: Oral  SpO2: 100%  Weight: 158 lb (71.7 kg)  Height: 5\' 2"  (1.575 m)    Exam General appearance : Awake, alert, not in any distress. Speech Clear. Not toxic looking HEENT: Atraumatic and Normocephalic, pupils equally reactive to light and accomodation Neck: supple, no JVD. No cervical lymphadenopathy.  Chest:Good air entry bilaterally, no added sounds  CVS: S1 S2 regular, no murmurs.  Abdomen: Bowel sounds present, Non tender and not distended with no guarding, rigidity or rebound. Extremities: B/L Lower Ext shows no edema, both legs are warm to touch Neurology: Awake alert, and oriented X 3, CN II-XII intact, Non focal Skin:No Rash Wounds:N/A  Data Review Lab Results  Component Value Date   HGBA1C 10.9 (H) 05/16/2018     Assessment & Plan  1. Left Occipital Subacute CVA  -ASA/Plavix  -RF modification  -enrolled in CVA study, next appt 06/09/18  -NO driving  -Cont Cholesterol med  -loop recorder per CARDS ([employer]) 2. HTN     -did not treat today, she just took meds 1 hour ago   -inc Coreg 25mg  BID  -cont Norvasc  -BP check in 2 weeks  -low salt diet 3. DM 2  -refill Janumet, cont glucotrol  -diabetes education in 2 weeks 4. Diabetic Neuropathy  -add low dose Neurontin  -education 5. Dyslipidemia  -cont Lipitor   Return in about 2 weeks (around 06/10/2018). with PharmD for education and BP check. 4 weeks with PCP.   The patient was given clear instructions to go to ER or return to medical center if symptoms don't improve, worsen or new problems develop. The patient verbalized understanding. The patient was told to call to get lab results if they haven't heard anything in the next week.   Total time spent with patient was 49 min. Greater than 50 % of this visit was spent face to face counseling and coordinating care  regarding risk factor modification, compliance importance and encouragement, education related to diabetes, HTN and CVA.  This note has been created with Surveyor, quantity. Any transcriptional errors are unintentional.    Zettie Pho, PA-C [facility] and [facility] [place], [place]   05/27/2018, 11:20 AM

## 2018-05-27 NOTE — Patient Instructions (Addendum)
Aim for 30 minutes of exercise most days. Rethink what you drink. Water is great! Aim for 2-3 Carb Choices per meal (30-45 grams) +/- 1 either way  Aim for 0-15 Carbs per snack if hungry  Include protein in moderation with your meals and snacks  Consider reading food labels for Total Carbohydrate and Fat Grams of foods  Consider checking BG at alternate times per day  Continue taking medication as directed Be mindful about how much sugar you are adding to beverages and other foods. Fruit Punch - find one with no sugar  Measure and decrease portions of carbohydrate foods  Make your plate and don't go back for seconds  NO DRIVING

## 2018-06-04 ENCOUNTER — Ambulatory Visit (INDEPENDENT_AMBULATORY_CARE_PROVIDER_SITE_OTHER): Payer: Private Health Insurance - Indemnity | Admitting: *Deleted

## 2018-06-04 DIAGNOSIS — I639 Cerebral infarction, unspecified: Secondary | ICD-10-CM

## 2018-06-04 LAB — CUP PACEART INCLINIC DEVICE CHECK
Implantable Pulse Generator Implant Date: 20191022
MDC IDC SESS DTM: 20191106121110

## 2018-06-04 NOTE — Progress Notes (Signed)
Wound check appointment. Steri-strips removed. Wound without redness or edema. Incision edges approximated, wound well healed. Battery status: Good. R-waves 0.82mV. 0 symptom episodes, 0 tachy episodes, 0 pause episodes, 0 brady episodes. 0 AF episodes (0% burden). Monthly summary reports and ROV with JA PRN.

## 2018-06-10 ENCOUNTER — Ambulatory Visit: Payer: Medicare HMO | Attending: Family Medicine | Admitting: Family Medicine

## 2018-06-10 ENCOUNTER — Encounter: Payer: Self-pay | Admitting: Family Medicine

## 2018-06-10 VITALS — BP 165/76 | HR 72 | Resp 16 | Ht 62.0 in | Wt 158.6 lb

## 2018-06-10 DIAGNOSIS — H5340 Unspecified visual field defects: Secondary | ICD-10-CM | POA: Diagnosis not present

## 2018-06-10 DIAGNOSIS — E1141 Type 2 diabetes mellitus with diabetic mononeuropathy: Secondary | ICD-10-CM | POA: Diagnosis not present

## 2018-06-10 DIAGNOSIS — I672 Cerebral atherosclerosis: Secondary | ICD-10-CM | POA: Diagnosis not present

## 2018-06-10 DIAGNOSIS — Z79899 Other long term (current) drug therapy: Secondary | ICD-10-CM

## 2018-06-10 DIAGNOSIS — Z7984 Long term (current) use of oral hypoglycemic drugs: Secondary | ICD-10-CM | POA: Insufficient documentation

## 2018-06-10 DIAGNOSIS — I10 Essential (primary) hypertension: Secondary | ICD-10-CM

## 2018-06-10 DIAGNOSIS — Z9889 Other specified postprocedural states: Secondary | ICD-10-CM | POA: Insufficient documentation

## 2018-06-10 DIAGNOSIS — Z8673 Personal history of transient ischemic attack (TIA), and cerebral infarction without residual deficits: Secondary | ICD-10-CM

## 2018-06-10 DIAGNOSIS — Z882 Allergy status to sulfonamides status: Secondary | ICD-10-CM | POA: Insufficient documentation

## 2018-06-10 DIAGNOSIS — Z09 Encounter for follow-up examination after completed treatment for conditions other than malignant neoplasm: Secondary | ICD-10-CM | POA: Insufficient documentation

## 2018-06-10 DIAGNOSIS — Z8249 Family history of ischemic heart disease and other diseases of the circulatory system: Secondary | ICD-10-CM | POA: Insufficient documentation

## 2018-06-10 DIAGNOSIS — Z90710 Acquired absence of both cervix and uterus: Secondary | ICD-10-CM | POA: Insufficient documentation

## 2018-06-10 DIAGNOSIS — I69398 Other sequelae of cerebral infarction: Secondary | ICD-10-CM

## 2018-06-10 DIAGNOSIS — Z87891 Personal history of nicotine dependence: Secondary | ICD-10-CM | POA: Insufficient documentation

## 2018-06-10 MED ORDER — LOSARTAN POTASSIUM 50 MG PO TABS: 50.0000 mg | ORAL_TABLET | Freq: Every day | ORAL | 6 refills | Status: DC

## 2018-06-10 MED ORDER — GLIPIZIDE ER 5 MG PO TB24: 5.0000 mg | ORAL_TABLET | Freq: Every day | ORAL | 6 refills | Status: DC

## 2018-06-10 NOTE — Progress Notes (Signed)
Subjective:    Patient ID: Heather Rogers, female    DOB: 11-25-1945, 72 y.o.   MRN: [id number]  HPI 72 year old female who is new to me as a patient who is returning for follow-up status post recent visit on 05/27/2018 with a different provider here in the practice for establishment of care status post hospitalization after being transported to the ED via EMS from her ophthalmologist office due to sudden onset of right-sided numbness and tingling and worsening of right-sided blurred vision.  Patient had left occipital stroke.  MRI/MRA of the head and neck showed left PCA occlusion and left PCA infarct as well as moderate to severe left ICA petrous and moderate right ICA cavernous segment stenosis and moderate mild by basilar artery stenosis and infarct was thought to be of embolic origin.  Patient with an echo showing grade 1 diastolic dysfunction with ejection fraction of 55 to 60%.  Patient also had hemoglobin A1c during hospitalization of 10.9 and patient was discharged on glipizide 10 mg and Janumet XR [phone].      At today's visit, patient is accompanied by her daughter who manages the patient's medications and checks the patient's blood sugars.  Patient's daughter states that patient's home blood pressures are generally in the 170s over 80s.  Patient states that last week, patient's blood pressure was 150/70 or 80 when they went to a neurology medication study visit.  Patient has not seen a neurologist for evaluation status post recent CVA.  Daughter thinks that blood pressure is higher today because yesterday she and her mom had a cheat day and ate [place] food and this may have been high in salt.  Patient denies any headaches or dizziness related to her elevated blood pressure.  Patient has had some increased fatigue which she feels may be related to her diabetes.  Patient is taking the medications prescribed at hospital discharge for treatment of her diabetes and patient's blood sugars are  running in the 80s to 110s fasting.  Patient has not felt shaky, sweaty or dizzy but does feel more tired.  Patient and daughter report the patient is compliant with her medications.  Patient does not live with her daughter but patient has a 54 year old mentally disabled niece lives with the patient.  Patient does not have to physically help her neice but she does have to constantly remind her neice to perform self-care task.       Patient reports that since her stroke, she does have some visual difficulty.  Patient states that she does not see clearly out of the right eye, patient reports that things appear blurred.  Patient also reports that she has loss of peripheral vision in her right eye.  Patient denies any difficulty with swallowing.  Patient denies any weakness of her arms or legs.  Past Medical History:  Diagnosis Date  . Diabetes ([place])   . Hypertension   . Stroke (cerebrum) [facility])    Past Surgical History:  Procedure Laterality Date  . hysterectomy    . LOOP RECORDER INSERTION N/A 05/20/2018   Procedure: LOOP RECORDER INSERTION;  Surgeon: Thompson Grayer, MD;  Location: [place] [facility];  Service: Cardiovascular;  Laterality: N/A;  . TEE WITHOUT CARDIOVERSION N/A 05/20/2018   Procedure: TRANSESOPHAGEAL ECHOCARDIOGRAM (TEE);  Surgeon: Sueanne Margarita, MD;  [facility]: [facility] ENDOSCOPY;  Service: Cardiovascular;  Laterality: N/A;   Family History  Problem Relation Age of Onset  . Hypertension Mother   . Hypertension Father    Social  History   Tobacco Use  . Smoking status: Former Research scientist (life sciences)  . Smokeless tobacco: Never Used  Substance Use Topics  . Alcohol use: Never    Frequency: Never  . Drug use: Never   Allergies  Allergen Reactions  . Sulfa Antibiotics     Itching       Review of Systems  Constitutional: Positive for fatigue. Negative for chills and fever.  HENT: Negative for hearing loss and trouble swallowing.   Eyes: Positive for visual disturbance. Negative for  photophobia and redness.  Respiratory: Negative for cough and shortness of breath.   Cardiovascular: Negative for chest pain, palpitations and leg swelling.  Gastrointestinal: Negative for abdominal pain, blood in stool, constipation, diarrhea and nausea.  Endocrine: Negative for polydipsia, polyphagia and polyuria.  Genitourinary: Negative for dysuria and frequency.  Musculoskeletal: Negative for back pain and gait problem.  Neurological: Positive for numbness (Occasional sensation of numbness/tingling in the feet). Negative for light-headedness and headaches.  Hematological: Negative for adenopathy. Does not bruise/bleed easily.       Objective:   Physical Exam BP (!) 165/76 (BP Location: Right Arm, Cuff Size: Normal)   Pulse 72   Resp 16   Ht 5\' 2"  (1.575 m)   Wt 158 lb 9.6 oz (71.9 kg)   BMI 29.01 kg/m Nurse's notes and vital signs reviewed General- well-nourished well-developed elderly female in no acute distress who is sitting on the exam table.  Patient is wearing glasses.  Patient does have some watering from her right eye.  Patient is accompanied by her daughter at today's visit. ENT-TMs gray, nares with mild edema of the nasal turbinates, normal oropharynx Neck-supple, no lymphadenopathy, no thyromegaly, patient does seem to have very soft bilateral carotid bruits but there was also outside noise from the hallway therefore it was difficult to determine the presence of bruits Cardiovascular-regular rate and rhythm Lungs-clear to auscultation bilaterally Back-no CVA tenderness Abdomen-soft, nontender Extremities-no edema Neuro- cranial nerves II through XII, grossly intact though patient does report decreased peripheral vision in the right eye, patient appears to have 5/5 muscle strength in all extremities. Diabetic foot exam- patient with poorly palpable posterior tibial and dorsalis pedis pulse in the right foot.  Right foot is slightly cooler than the left foot.  There is no  evidence of cyanosis and patient with normal capillary refill.  Patient with normal monofilament exam bilaterally.  No active skin breakdown on the feet.  Nails are appropriately trimmed.  Left foot with normal monofilament exam with the exception of decreased sensation on the left heel.  No active skin breakdown on the left foot.  Patient has 1+ dorsalis pedis and posterior tibial pulses on the left.        Assessment & Plan:  1. Type 2 diabetes mellitus with diabetic mononeuropathy, without long-term current use of insulin (HCC) Prior to hospitalization, patient had poorly controlled diabetes however her blood sugar diary brought in by the daughter, patient's blood sugars have often been somewhat low.  Patient has had complained of increased fatigue.  Due to patient's age and history of CVA, I discussed with patient and the daughter that I would like to try and keep fasting blood sugars around 120-130 or less but not much lower in order to prevent hypoglycemic episodes.  Patient's glipizide will be decreased from 10 mg to 5 mg once daily.  Patient will continue the Janumet prescribed at hospital discharge.  Patient will have CMP at today's visit in follow-up of medication use  patient will follow-up in approximately 1 to 2 weeks with the clinical pharmacist to report her blood sugars as well as in follow-up of her hypertension. - glipiZIDE (GLUCOTROL XL) 5 MG 24 hr tablet; Take 1 tablet (5 mg total) by mouth daily with breakfast. To lower blood sugar.  Dispense: 30 tablet; Refill: 6 - Comprehensive metabolic panel  2. Essential hypertension Patient will continue her current medications prescribed at hospital discharge, amlodipine and Coreg and losartan will be added at 50 mg once daily and patient will follow-up in 1 to 2 weeks with clinical pharmacist and if needed, Cozaar can be increased to 100 mg at that time.  Patient however has a history of cerebrovascular disease with multiple areas of stenosis  and patient's blood pressure will probably need to be kept on the slightly higher side between 140-150 to make sure that perfusion is adequate. - losartan (COZAAR) 50 MG tablet; Take 1 tablet (50 mg total) by mouth daily. To lower blood pressure  Dispense: 30 tablet; Refill: 6 - Comprehensive metabolic panel  3. Atherosclerotic cerebrovascular disease Patient with atherosclerotic cerebrovascular disease based on MRA done during hospitalization for recent CVA.  Patient is being referred to neurology for further evaluation and treatment and patient will have CMP done in follow-up of her recent start of Lipitor 80 mg.  Patient is encouraged to take the Lipitor daily and also follow a low-fat diet. - Ambulatory referral to Neurology - Comprehensive metabolic panel  4. Visual field defect due to and not concurrent with cerebrovascular accident (CVA) Patient has been referred to neurology and follow-up of recent CVA with resultant/residual visual field defect - Ambulatory referral to Neurology  5. Status post CVA Patient status post recent CVA and will have follow-up with neurology regarding CVA and prevention of recurrence of CVA.  Per daughter, patient is currently in a study involving medication for treatment of CVA but of course there is a chance that patient may be taking the placebo - Ambulatory referral to Neurology  6. Encounter for long-term (current) use of medications Patient will have CMP follow-up of long-term use of medications as well as recent start of high-dose statin medication status post hospitalization for CVA  *Patient was offered influenza immunization which she declined at today's visit  An After Visit Summary was printed and given to the patient.  Return for HTN/DM-1-2 weeks with Lurena Joiner; 4 weeks with PCP. - Comprehensive metabolic panel

## 2018-06-10 NOTE — Patient Instructions (Signed)
Cerebral Arteriosclerosis Cerebral arteriosclerosis is a condition that affects the arteries in your brain. When you have cerebral arteriosclerosis, these arteries become thicker, harder, and narrower than normal. Once this damage starts, a sticky substance (plaque) can build up inside your arteries and block blood flow. This reduces the normal blood flow to your brain. Blood carries oxygen to your brain. Since blood flow to your brain is reduced, your brain may not get the oxygen it needs. This process may start early in life and build up over time. Blood clots or plaques that rupture and break off may completely block blood flow and cause sudden and serious damage. Cerebral arteriosclerosis can cause serious problems such as stroke or dementia. What increases the risk? Risk factors for developing cerebral arteriosclerosis include:  Being female.  Older age. The likelihood of this disorder increases with age.  Being Asian, African American, or Hispanic.  Having a family history of certain conditions, such as heart disease or stroke.  Having heart disease or a history of stroke.  Lifestyle factors that can increase your risk of developing cerebral arteriosclerosis include:  Smoking or being exposed to secondhand smoke.  Having diabetes.  Being overweight.  Not getting enough exercise.  Having high blood pressure (hypertension).  Having high cholesterol.  Having a diet high in fat.  What are the signs or symptoms? The most common symptoms of cerebral arteriosclerosis include:  Numbness or weakness in your arms or legs.  Speech problems.  Problems swallowing.  Vision problems.  Confusion.  Irritability.  Personality changes, such as loss of interest, irritability, or confusion (vascular dementia).  Headache or facial pain.  How is this diagnosed? Your health care provider may diagnose cerebral arteriosclerosis based on your symptoms and a physical exam. You may also  have imaging studiesof your brain to confirm the diagnosis. These may include:  An imaging test using sound waves (ultrasound).  An imaging test after getting an injection of dye (angiogram).  How is this treated? Treatment may include:  Medicine to control conditions that put you at risk of developing cerebral arteriosclerosis. These conditions may include: ? High blood pressure. ? High cholesterol. ? Diabetes.  Taking medicine to thin your blood and prevent stroke.  Surgery. This may be: ? Intracranial stent placement. This is a procedure to locate a blocked artery in the brain, widen the artery, and keep the artery open. ? Surgery to bypass a blocked artery with a healthy artery taken from another part of the body.  Follow these instructions at home:  Take medicine only as directed by your health care provider.  Do not use any tobacco products, including cigarettes, chewing tobacco, or electronic cigarettes. If you need help quitting, ask your health care provider.  Eat a low-fat, low-cholesterol, and low-sodium diet as directed by your health care provider.  Follow an exercise program approved by your health care provider.  Maintain a healthy weight. Lose weight if necessary.  Work with your health care provider to manage other health conditions, such as hypertension or diabetes.  Keep all follow-up visits as directed by your health care provider. This is important. Contact a health care provider if:  You have frequent headaches.  You have dizziness.  You or someone else notices changes in your personality.  You have facial pain.  You have periods of confusion. Get help right away if:  You have a very bad headache.  You have weakness or numbness in your arms or legs.  You have droopiness of  your face.  You have difficulty speaking.  You have difficulty swallowing.  You have trouble understanding what other people are saying.  You have sudden  confusion.  You have sudden vision problems.  You have a seizure or loss of consciousness. Any of these symptoms may represent a serious problem that is an emergency. Do not wait to see if the symptoms will go away. Get medical help right away. Call your local emergency services (911 in [place] Do not drive yourself to the hospital. This information is not intended to replace advice given to you by your health care provider. Make sure you discuss any questions you have with your health care provider. Document Released: 07/06/2002 Document Revised: 12/22/2015 Document Reviewed: 09/03/2013 Elsevier Interactive Patient Education  2018 [employer]  Cerebral Arteriosclerosis Cerebral arteriosclerosis is a condition that affects the arteries in your brain. When you have cerebral arteriosclerosis, these arteries become thicker, harder, and narrower than normal. Once this damage starts, a sticky substance (plaque) can build up inside your arteries and block blood flow. This reduces the normal blood flow to your brain. Blood carries oxygen to your brain. Since blood flow to your brain is reduced, your brain may not get the oxygen it needs. This process may start early in life and build up over time. Blood clots or plaques that rupture and break off may completely block blood flow and cause sudden and serious damage. Cerebral arteriosclerosis can cause serious problems such as stroke or dementia. What increases the risk? Risk factors for developing cerebral arteriosclerosis include:  Being female.  Older age. The likelihood of this disorder increases with age.  Being Asian, African American, or Hispanic.  Having a family history of certain conditions, such as heart disease or stroke.  Having heart disease or a history of stroke.  Lifestyle factors that can increase your risk of developing cerebral arteriosclerosis include:  Smoking or being exposed to secondhand smoke.  Having diabetes.  Being  overweight.  Not getting enough exercise.  Having high blood pressure (hypertension).  Having high cholesterol.  Having a diet high in fat.  What are the signs or symptoms? The most common symptoms of cerebral arteriosclerosis include:  Numbness or weakness in your arms or legs.  Speech problems.  Problems swallowing.  Vision problems.  Confusion.  Irritability.  Personality changes, such as loss of interest, irritability, or confusion (vascular dementia).  Headache or facial pain.  How is this diagnosed? Your health care provider may diagnose cerebral arteriosclerosis based on your symptoms and a physical exam. You may also have imaging studiesof your brain to confirm the diagnosis. These may include:  An imaging test using sound waves (ultrasound).  An imaging test after getting an injection of dye (angiogram).  How is this treated? Treatment may include:  Medicine to control conditions that put you at risk of developing cerebral arteriosclerosis. These conditions may include: ? High blood pressure. ? High cholesterol. ? Diabetes.  Taking medicine to thin your blood and prevent stroke.  Surgery. This may be: ? Intracranial stent placement. This is a procedure to locate a blocked artery in the brain, widen the artery, and keep the artery open. ? Surgery to bypass a blocked artery with a healthy artery taken from another part of the body.  Follow these instructions at home:  Take medicine only as directed by your health care provider.  Do not use any tobacco products, including cigarettes, chewing tobacco, or electronic cigarettes. If you need help quitting, ask your  health care provider.  Eat a low-fat, low-cholesterol, and low-sodium diet as directed by your health care provider.  Follow an exercise program approved by your health care provider.  Maintain a healthy weight. Lose weight if necessary.  Work with your health care provider to manage other  health conditions, such as hypertension or diabetes.  Keep all follow-up visits as directed by your health care provider. This is important. Contact a health care provider if:  You have frequent headaches.  You have dizziness.  You or someone else notices changes in your personality.  You have facial pain.  You have periods of confusion. Get help right away if:  You have a very bad headache.  You have weakness or numbness in your arms or legs.  You have droopiness of your face.  You have difficulty speaking.  You have difficulty swallowing.  You have trouble understanding what other people are saying.  You have sudden confusion.  You have sudden vision problems.  You have a seizure or loss of consciousness. Any of these symptoms may represent a serious problem that is an emergency. Do not wait to see if the symptoms will go away. Get medical help right away. Call your local emergency services (911 in [place] Do not drive yourself to the hospital. This information is not intended to replace advice given to you by your health care provider. Make sure you discuss any questions you have with your health care provider. Document Released: 07/06/2002 Document Revised: 12/22/2015 Document Reviewed: 09/03/2013 Elsevier Interactive Patient Education  2018 [employer].

## 2018-06-11 ENCOUNTER — Telehealth: Payer: Self-pay | Admitting: Cardiology

## 2018-06-11 LAB — COMPREHENSIVE METABOLIC PANEL WITH GFR
ALT: 17 IU/L (ref 0–32)
AST: 23 IU/L (ref 0–40)
Albumin/Globulin Ratio: 1.3 (ref 1.2–2.2)
Albumin: 4.3 g/dL (ref 3.5–4.8)
Alkaline Phosphatase: 96 IU/L (ref 39–117)
BUN/Creatinine Ratio: 14 (ref 12–28)
BUN: 13 mg/dL (ref 8–27)
Bilirubin Total: 0.2 mg/dL (ref 0.0–1.2)
CO2: 19 mmol/L — ABNORMAL LOW (ref 20–29)
Calcium: 10.1 mg/dL (ref 8.7–10.3)
Chloride: 105 mmol/L (ref 96–106)
Creatinine, Ser: 0.9 mg/dL (ref 0.57–1.00)
GFR calc Af Amer: 74 mL/min/1.73
GFR calc non Af Amer: 64 mL/min/1.73
Globulin, Total: 3.3 g/dL (ref 1.5–4.5)
Glucose: 103 mg/dL — ABNORMAL HIGH (ref 65–99)
Potassium: 4.3 mmol/L (ref 3.5–5.2)
Sodium: 142 mmol/L (ref 134–144)
Total Protein: 7.6 g/dL (ref 6.0–8.5)

## 2018-06-11 NOTE — Telephone Encounter (Signed)
LMOVM requesting that pt send manual transmission b/c home monitor has not updated in at least 14 days.    

## 2018-06-12 ENCOUNTER — Other Ambulatory Visit: Payer: Self-pay | Admitting: Family Medicine

## 2018-06-12 NOTE — Telephone Encounter (Signed)
1) Medication(s) Requested (by name): -amLODipine (NORVASC) 10 MG tablet  -atorvastatin (LIPITOR) 80 MG tablet   2) Pharmacy of Choice: -[facility]  3) Special Requests:   Approved medications will be sent to the pharmacy, we will reach out if there is an issue.  Requests made after 3pm may not be addressed until the following business day!  If a patient is unsure of the name of the medication(s) please note and ask patient to call back when they are able to provide all info, do not send to responsible party until all information is available!

## 2018-06-13 ENCOUNTER — Telehealth (INDEPENDENT_AMBULATORY_CARE_PROVIDER_SITE_OTHER): Payer: Self-pay

## 2018-06-13 ENCOUNTER — Telehealth: Payer: Self-pay | Admitting: *Deleted

## 2018-06-13 MED ORDER — AMLODIPINE BESYLATE 10 MG PO TABS: 10.0000 mg | ORAL_TABLET | Freq: Every day | ORAL | 0 refills | Status: DC

## 2018-06-13 MED ORDER — ATORVASTATIN CALCIUM 80 MG PO TABS: 80.0000 mg | ORAL_TABLET | Freq: Every day | ORAL | 0 refills | Status: DC

## 2018-06-13 NOTE — Telephone Encounter (Signed)
Notes recorded by Carilyn Goodpasture, RN on 06/13/2018 at 4:26 PM EST Left message on voicemail to return call.   ------  Notes recorded by Antony Blackbird, MD on 06/12/2018 at 4:09 PM EST Notify patient of normal CMP with glucose of 103

## 2018-06-13 NOTE — Telephone Encounter (Signed)
-----   Message from Antony Blackbird, MD sent at 06/12/2018  4:09 PM EST ----- Notify patient of normal CMP with glucose of 103

## 2018-06-13 NOTE — Telephone Encounter (Signed)
Spoke with patients daughter per DPR. She is aware that CMP is normal with glucose of 103. Daughter did state that yesterday patients CBG fasting was 70, postprandial was 78. She added sugar to patients squash during dinner and then checked CBG and it was 90. CBG this morning 11/15 was 88. Daughter did state that PCP recently lowered patients diabetic medication doses. Advised daughter to continue checking and recording sugar readings and take to next appointment on 12/10 at 9:50. Daughter expressed understanding. She will relay results to patient. Nat Christen, CMA

## 2018-06-16 ENCOUNTER — Other Ambulatory Visit: Payer: Self-pay | Admitting: Family Medicine

## 2018-06-16 NOTE — Telephone Encounter (Signed)
Ok to refill allopurinol

## 2018-06-16 NOTE — Telephone Encounter (Signed)
1) Medication(s) Requested (by name): -allopurinol (ZYLOPRIM) 100 MG tablet   2) Pharmacy of Choice: -[place], [place] - [address] 3) Special Requests:   Approved medications will be sent to the pharmacy, we will reach out if there is an issue.  Requests made after 3pm may not be addressed until the following business day!  If a patient is unsure of the name of the medication(s) please note and ask patient to call back when they are able to provide all info, do not send to responsible party until all information is available!

## 2018-06-17 MED ORDER — ALLOPURINOL 100 MG PO TABS: 100.0000 mg | ORAL_TABLET | Freq: Every day | ORAL | 0 refills | Status: DC

## 2018-06-17 NOTE — Telephone Encounter (Signed)
I am not authorized to refill gout medications, please authorize this with the appropriate number of refills, if any. Thank you!

## 2018-06-18 ENCOUNTER — Encounter: Payer: Self-pay | Admitting: Pharmacist

## 2018-06-18 ENCOUNTER — Ambulatory Visit: Payer: Medicare HMO | Attending: Family Medicine | Admitting: Pharmacist

## 2018-06-18 VITALS — BP 167/74 | HR 73

## 2018-06-18 DIAGNOSIS — E1141 Type 2 diabetes mellitus with diabetic mononeuropathy: Secondary | ICD-10-CM | POA: Diagnosis not present

## 2018-06-18 DIAGNOSIS — I10 Essential (primary) hypertension: Secondary | ICD-10-CM | POA: Insufficient documentation

## 2018-06-18 DIAGNOSIS — E1165 Type 2 diabetes mellitus with hyperglycemia: Secondary | ICD-10-CM | POA: Insufficient documentation

## 2018-06-18 DIAGNOSIS — Z87891 Personal history of nicotine dependence: Secondary | ICD-10-CM | POA: Insufficient documentation

## 2018-06-18 DIAGNOSIS — Z79899 Other long term (current) drug therapy: Secondary | ICD-10-CM | POA: Insufficient documentation

## 2018-06-18 DIAGNOSIS — Z7984 Long term (current) use of oral hypoglycemic drugs: Secondary | ICD-10-CM | POA: Insufficient documentation

## 2018-06-18 LAB — GLUCOSE, POCT (MANUAL RESULT ENTRY): POC Glucose: 109 mg/dL — AB (ref 70–99)

## 2018-06-18 MED ORDER — LOSARTAN POTASSIUM 100 MG PO TABS: 100.0000 mg | ORAL_TABLET | Freq: Every day | ORAL | 0 refills | Status: DC

## 2018-06-18 NOTE — Patient Instructions (Addendum)
Thank you for coming to see me today. Please do the following:  1. Stop glipizide.  2. Continue Janumet.  3. Start taking 1 pill of carvedilol and 1 pill of losartan in the morning.  4. Start taking 1 pill of carvedilol and 1 pill of amlodipine in the evening.  5. Continue checking blood sugars at home.  6. Continue making the lifestyle changes we've discussed together during our visit. Diet and exercise play a significant role in improving your blood sugars.  7. Follow-up with PCP 07/08/18   Hypoglycemia or low blood sugar:   Low blood sugar can happen quickly and may become an emergency if not treated right away.   While this shouldn't happen often, it can be brought upon if you skip a meal or do not eat enough. Also, if your insulin or other diabetes medications are dosed too high, this can cause your blood sugar to go to low.   Warning signs of low blood sugar include: 1. Feeling shaky or dizzy 2. Feeling weak or tired  3. Excessive hunger 4. Feeling anxious or upset  5. Sweating even when you aren't exercising  What to do if I experience low blood sugar? 1. Check your blood sugar with your meter. If lower than 70, proceed to step 2.  2. Treat with 3-4 glucose tablets or 3 packets of regular sugar. If these aren't around, you can try hard candy. Yet another option would be to drink 4 ounces of fruit juice or 6 ounces of REGULAR soda.  3. Re-check your sugar in 15 minutes. If it is still below 70, do what you did in step 2 again. If has come back up, go ahead and eat a snack or small meal at this time.

## 2018-06-18 NOTE — Progress Notes (Signed)
    S:    PCP: Dr. Chapman Fitch  No chief complaint on file.  Patient arrives in good spirits. She is accompanied by her daughter today. Presents for diabetes management at the request of Dr. Chapman Fitch. Patient was referred on 06/10/18. Glipizide decreased to 5 mg daily. Of note, pt's BP was 165/76.   Today, pt reports feeling well. She denies symptoms of hypoglycemia but reports objective readings in the 70s. Her daughter states that she is noticing an increase in patient's fatigue. She denies symptoms of uncontrolled hyperglycemia.   Patient's daughter cooks. States that they limit carbs and sweets. Pt does not exercise.   Patient reports adherence with medications.  Current diabetes medications include: Janumet XR [phone] mg daily, glipizide xl 100 daily Current hypertension medications include: amlodipine 10 mg daily, carvedilol 25 mg BID, losartan 50 mg daily  Family/Social History: former smoker, denies alcohol   Insurance coverage/medication affordability: [employer]   O:  BP in L arm after 5 minutes rest: 164/74, HR 73  POCT glucose: 109 (post-prandial) Home fasting CBG: 70 - 117   Lab Results  Component Value Date   HGBA1C 10.9 (H) 05/16/2018   There were no vitals filed for this visit.  Lipid Panel     Component Value Date/Time   CHOL 126 05/16/2018 0420   TRIG 175 (H) 05/16/2018 0420   HDL 27 (L) 05/16/2018 0420   CHOLHDL 4.7 05/16/2018 0420   VLDL 35 05/16/2018 0420   LDLCALC 64 05/16/2018 0420   Clinical ASCVD: Yes - stroke  A/P: Diabetes longstanding currently uncontrolled based on her A1c, however, her home glucose levels are at goal to low. Patient is able to verbalize appropriate hypoglycemia management plan. Patient is adherent with medication.   Pt is dependent with some if not most of her ADLs. With her stroke hx, limited mobility, and older age I recommend this patient target a less stringent A1c goal of 7.5 - 8. Will stop glipizide and continue with Janumet. Pt is  not interested in injectable therapy at this time. With her stroke hx, would like to see her use an agent with proven ASCVD benefit. Jardiance could offer benefit but would need to be used cautiously due to dehydration and GU infection risk. Will assess at a later encounter.     -Discontinued glipizide.  -Continued Janumet XR [phone] mg daily.  -Extensively discussed pathophysiology of DM, recommended lifestyle interventions, dietary effects on glycemic control -Counseled on s/sx of and management of hypoglycemia -HM: current on PNA, influenza - is due for tetanus vaccine -Next A1C anticipated 07/2018.   ASCVD risk - secondary prevention in patient with DM. Last LDL is controlled. High intensity statin indicated. Aspirin is indicated. Pt is taking place in the [place] study.  -Continued atorvastatin 80 mg.   Hypertension longstanding currently uncontrolled.  BP goal less stringent per PCP. Patient is adherent with medication. Will increase losartan to 100 mg daily.  -increased losartan to 100 mg daily -Continued amlodipine and carvedilol   Written patient instructions provided. Total time in face to face counseling 15 minutes.   Follow up PCP Clinic Visit 07/08/18.     Patient seen with: Dixon Boos, PharmD Candidate [employer]  Benard Halsted, PharmD, [place] [phone]

## 2018-06-20 ENCOUNTER — Other Ambulatory Visit: Payer: Self-pay | Admitting: Pharmacist

## 2018-06-20 MED ORDER — ALLOPURINOL 100 MG PO TABS: 100.0000 mg | ORAL_TABLET | Freq: Every day | ORAL | 0 refills | Status: DC

## 2018-06-23 ENCOUNTER — Ambulatory Visit (INDEPENDENT_AMBULATORY_CARE_PROVIDER_SITE_OTHER): Payer: Medicare HMO

## 2018-06-23 DIAGNOSIS — I639 Cerebral infarction, unspecified: Secondary | ICD-10-CM | POA: Diagnosis not present

## 2018-06-23 NOTE — Progress Notes (Signed)
[facility] Summary Report / Loop Recorder 

## 2018-06-25 ENCOUNTER — Ambulatory Visit: Payer: Medicare HMO | Admitting: Adult Health

## 2018-06-25 ENCOUNTER — Encounter: Payer: Self-pay | Admitting: Adult Health

## 2018-06-25 VITALS — BP 172/82 | HR 88 | Ht 62.0 in | Wt 155.2 lb

## 2018-06-25 DIAGNOSIS — I6300 Cerebral infarction due to thrombosis of unspecified precerebral artery: Secondary | ICD-10-CM

## 2018-06-25 DIAGNOSIS — E785 Hyperlipidemia, unspecified: Secondary | ICD-10-CM | POA: Diagnosis not present

## 2018-06-25 DIAGNOSIS — E119 Type 2 diabetes mellitus without complications: Secondary | ICD-10-CM

## 2018-06-25 DIAGNOSIS — I10 Essential (primary) hypertension: Secondary | ICD-10-CM | POA: Diagnosis not present

## 2018-06-25 DIAGNOSIS — H53461 Homonymous bilateral field defects, right side: Secondary | ICD-10-CM

## 2018-06-25 NOTE — Progress Notes (Signed)
[facility] [address] [place]. [place] [address] [phone]       OFFICE FOLLOW UP NOTE  Ms. Heather Rogers Date of Birth:  1946/07/28 Medical Record Number:  [id number]   Reason for Referral:  hospital stroke follow up  CHIEF COMPLAINT:  Chief Complaint  Patient presents with  . Follow-up    Hospital Stroke follow uo room 9 pt with [name] her daughter    HPI: Heather Rogers is being seen today for initial visit in the office for left embolic PCA infarct secondary to undetermined source on 05/15/2018. History obtained from patient, daughter and chart review. Reviewed all radiology images and labs personally.  Ms. Heather Rogers is a 72 y.o. female with history of HTN and DB  who presented with blurred vision, hypertensive urgency and altered sensation R face.  According to notes, her visual symptoms began approximately 6 days prior to ED admission and as she was being brought to her ophthalmologist appointment by a neighbor, she started to complain of right-sided numbness therefore EMS was called.  CT head reviewed and showed subacute left occipital stroke with left thalamic lacunae.  MRI brain reviewed and showed left PCA occlusion and left PCA infarct along with old left thalamic lacunae and small vessel disease.  MRA head negative.  MRA neck showed moderate to severe left ICA petrous and moderate right ICA cavernous segment stenosis with moderate mid BA stenosis.  Carotid Doppler showed bilateral ICA 1 to 39% stenosis.  2D echo showed an EF of 55 to 60% without cardiac source of embolus identified.  It was determined that left PCA infarct was embolic secondary to unknown source, is recommended to undergo TEE with possible loop recorder placement as outpatient.  Patient was initially found to be in hypertensive urgency with BP 196/83 but normalized throughout admission and recommended long-term BP goal normotensive range.  LDL 64 and recommended atorvastatin  80 mg daily.  A1c 10.9 and recommended tight glycemic control with close PCP follow-up for DM management.  Patient was not on antithrombotic PTA and recommended DAPT for 3 weeks and aspirin alone.  Of note, the patient has a history of some noncompliance noncompliance with her medications including blood pressure medications and only occasionally will take her aspirin.  She does have 2 neighbors that monitor her and assist with her care along with ensuring glucose monitoring and medication administration.  Patient was discharged with recommendations of home health PT/OT in stable condition.  Interval history 06/25/2018: Patient is being seen today for hospital follow-up.  She did undergo TEE which did not show PFO or cardiac source of embolus therefore loop recorder placed to rule out atrial fibrillation as potential cause of infarct. She is currently participating in [place] trial. She is unsure if vision has improved since discharge and continues to have blurry vision in right periphery. She was evaluated by home therapies and after the first assessment, it was determined that patient did not need additional therapies.  Blood pressure today 172/82 and as she does monitor at home typically 179/82 (dauhter states typically in the 170s). She continues to be followed by PCP due to continued difficulties maintaining blood pressure. She does monitor glucose at home and being followed by PCP as well for management. She does live on her own but daughter helps with cooking which is new since her stroke. She also has not returned to driving which her daughter helps with transportation. She has not followed up with ophthalmologist since discharge.  Daughter is planning on finding a new ophthalmologist as the ophthalmologist she was at when the stroke occurred apparently does not accept her insurance.  She has no further concerns at this time.  Denies new or worsening stroke/TIA symptoms.    ROS:   14 system review of  systems performed and negative with exception of blurred vision, loss of vision and memory loss  PMH:  Past Medical History:  Diagnosis Date  . Diabetes ([place])   . Hypertension   . Stroke (cerebrum) (HCC)     PSH:  Past Surgical History:  Procedure Laterality Date  . hysterectomy    . LOOP RECORDER INSERTION N/A 05/20/2018   Procedure: LOOP RECORDER INSERTION;  Surgeon: Thompson Grayer, MD;  Location: [facility];  Service: Cardiovascular;  Laterality: N/A;  . TEE WITHOUT CARDIOVERSION N/A 05/20/2018   Procedure: TRANSESOPHAGEAL ECHOCARDIOGRAM (TEE);  Surgeon: Sueanne Margarita, MD;  Location: [facility];  Service: Cardiovascular;  Laterality: N/A;    Social History:  Social History   Socioeconomic History  . Marital status: Divorced    Spouse name: Not on file  . Number of children: Not on file  . Years of education: Not on file  . Highest education level: Not on file  Occupational History  . Not on file  Social Needs  . Financial resource strain: Not on file  . Food insecurity:    Worry: Not on file    Inability: Not on file  . Transportation needs:    Medical: Not on file    Non-medical: Not on file  Tobacco Use  . Smoking status: Former Research scientist (life sciences)  . Smokeless tobacco: Never Used  Substance and Sexual Activity  . Alcohol use: Never    Frequency: Never  . Drug use: Never  . Sexual activity: Not Currently  Lifestyle  . Physical activity:    Days per week: Not on file    Minutes per session: Not on file  . Stress: Not on file  Relationships  . Social connections:    Talks on phone: Not on file    Gets together: Not on file    Attends religious service: Not on file    Active member of club or organization: Not on file    Attends meetings of clubs or organizations: Not on file    Relationship status: Not on file  . Intimate partner violence:    Fear of current or ex partner: Not on file    Emotionally abused: Not on file    Physically abused: Not on file      Forced sexual activity: Not on file  Other Topics Concern  . Not on file  Social History Narrative   Lives in [place].    Family History:  Family History  Problem Relation Age of Onset  . Hypertension Mother   . Stroke Mother   . Hypertension Father     Medications:   Current Outpatient Medications on File Prior to Visit  Medication Sig Dispense Refill  . allopurinol (ZYLOPRIM) 100 MG tablet Take 1 tablet (100 mg total) by mouth daily. 30 tablet 0  . amLODipine (NORVASC) 10 MG tablet Take 1 tablet (10 mg total) by mouth daily. 30 tablet 0  . atorvastatin (LIPITOR) 80 MG tablet Take 1 tablet (80 mg total) by mouth daily at 6 PM. 30 tablet 0  . carvedilol (COREG) 25 MG tablet Take 1 tablet (25 mg total) by mouth 2 (two) times daily with a meal. 60 tablet 3  .  gabapentin (NEURONTIN) 100 MG capsule Take 1 capsule (100 mg total) by mouth 3 (three) times daily. 90 capsule 3  . losartan (COZAAR) 100 MG tablet Take 1 tablet (100 mg total) by mouth daily. 90 tablet 0  . SitaGLIPtin-MetFORMIN HCl (JANUMET XR) [phone] MG TB24 Take 1 tablet by mouth daily. 30 tablet 3  . Study - [place] - apixaban 2.5mg  or Placebo Take 2.5 mg by mouth 2 (two) times daily.    . Study - [place] - aspirin 81mg  or Placebo Take 81 mg by mouth daily.     No current facility-administered medications on file prior to visit.     Allergies:   Allergies  Allergen Reactions  . Sulfa Antibiotics     Itching      Physical Exam  Vitals:   06/25/18 1241  BP: (!) 172/82  Pulse: 88  Weight: 155 lb 3.8 oz (70.4 kg)  Height: 5\' 2"  (1.575 m)   Body mass index is 28.39 kg/m. No exam data present  General: well developed, well nourished, pleasant elderly African-American female, seated, in no evident distress Head: head normocephalic and atraumatic.   Neck: supple with no carotid or supraclavicular bruits Cardiovascular: regular rate and rhythm, no murmurs Musculoskeletal: no deformity Skin:  no  rash/petichiae Vascular:  Normal pulses all extremities  Neurologic Exam Mental Status: Awake and fully alert. Oriented to place and time. Recent and remote memory intact. Attention span, concentration and fund of knowledge appropriate. Mood and affect appropriate.  Cranial Nerves: Fundoscopic exam reveals sharp disc margins. Pupils equal, briskly reactive to light. Extraocular movements full without nystagmus. Visual fields right homonymous superior quadrantanopia. Hearing intact. Facial sensation intact. Face, tongue, palate moves normally and symmetrically.  Motor: Normal bulk and tone. Normal strength in all tested extremity muscles. Sensory.: intact to touch , pinprick , position and vibratory sensation.  Coordination: Rapid alternating movements normal in all extremities. Finger-to-nose and heel-to-shin performed accurately bilaterally. Gait and Station: Arises from chair without difficulty. Stance is normal. Gait demonstrates normal stride length and balance. Able to heel, toe and tandem walk without difficulty.  Reflexes: 1+ and symmetric. Toes downgoing.    NIHSS  1 Modified Rankin  2    Diagnostic Data (Labs, Imaging, Testing)  CT HEAD WO CONTRAST 05/15/18 IMPRESSION: 1. Moderate acute infarct in the left occipital lobe. 2. Lateral left thalamus lacune.  MR BRAIN WO CONTRAST MR MRA HEAD  MR MRA NECK 05/15/2018 IMPRESSION: 1. Positive for segmental Left PCA occlusion with acute to subacute patchy Left PCA infarct. Territory from the left hippocampus through the medial left occipital pole is affected with edema but no associated hemorrhage or mass effect. 2. Neck MRA is negative for hemodynamically significant stenosis in the neck (50% Left ICA origin stenosis), but Head MRA is positive for: - moderate to severe Left ICA petrous segment and moderate Right ICA cavernous segment stenoses. - moderate mid Basilar Artery stenosis. 3. Chronic small vessel ischemia in the  left thalamus. Mild to moderate for age cerebral white matter signal changes also probably small vessel disease related.  ECHOCARDIOGRAM 05/15/2018 Impressions: - Normal LV size with moderate LV hypertrophy. EF 55-60%. Normal RV   size and systolic function. No significant valvular   abnormalities.  ECHO TEE 05/20/2018 Study Conclusions - Left ventricle: Systolic function was normal. The estimated   ejection fraction was in the range of 55% to 60%. Wall motion was   normal; there were no regional wall motion abnormalities. - Aortic valve: Trileaflet; mildly thickened,  mildly calcified   leaflets. - Mitral valve: There was mild regurgitation. - Left atrium: The atrium was mildly dilated. No evidence of   thrombus in the atrial cavity or appendage. - Right atrium: No evidence of thrombus in the atrial cavity or   appendage. - Atrial septum: No defect or patent foramen ovale was identified. - Tricuspid valve: There was mild regurgitation. - Pulmonic valve: There was trivial regurgitation. Impressions: - No cardiac source of emboli was indentified    ASSESSMENT: Heather Rogers is a 72 y.o. year old female here with left embolic PCA infarct on 03/12/4817 secondary to unknown source therefore loop recorder placed on 05/20/2018. Vascular risk factors include HTN, HLD and DM.  Patient is being seen today for hospital follow-up and does continue to have right homonymous superior quadrantanopia but denies any new or worsening symptoms.    PLAN:  1. Left PCA infarct: Continue atorvastatin for secondary stroke prevention.  Patient currently participating in [place] trial therefore blood thinners will be managed during trial participation.  Maintain strict control of hypertension with blood pressure goal below 130/90, diabetes with hemoglobin A1c goal below 6.5% and cholesterol with LDL cholesterol (bad cholesterol) goal below 70 mg/dL.  I also advised the patient to eat a healthy diet  with plenty of whole grains, cereals, fruits and vegetables, exercise regularly with at least 30 minutes of continuous activity daily and maintain ideal body weight. 2. Right homonymous superior quadrantanopia: Advised patient to schedule appointment with ophthalmology for peripheral vision testing prior to returning to driving 3. Cryptogenic etiology: Continue to monitor loop recorder for possible atrial fibrillation.  Continue participation in [place] trial 4. HTN: Advised to continue current treatment regimen.  Today's BP 172/82.  Advised to continue to monitor at home along with continued follow-up with PCP for management 5. HLD: Advised to continue current treatment regimen along with continued follow-up with PCP for future prescribing and monitoring of lipid panel 6. DMII: Advised to continue to monitor glucose levels at home along with continued follow-up with PCP for management and monitoring    Follow up in 3 months or call earlier if needed   Greater than 50% of time during this 25 minute visit was spent on counseling, explanation of diagnosis of left PCA infarct, reviewing risk factor management of HLD, HTN and DM, planning of further management along with potential future management, and discussion with patient and family answering all questions.    Venancio Poisson, AGNP-BC  [facility] [address] [place], [place] [address]  Phone [phone] Fax [phone] Note: This document was prepared with digital dictation and possible smart phrase technology. Any transcriptional errors that result from this process are unintentional.

## 2018-06-25 NOTE — Patient Instructions (Signed)
Continue to particpate in [place] trial regarding blood thinner management for secondary stroke prevention  Continue lipitor for secondary stroke prevention  Continue to follow up with PCP regarding cholesterol, blood pressure and diabetes management   We will continue to monitor loop recorder and you will be called if atrial fibrillation if found   Schedule appointment with eye doctor for peripheral vision testing  Continue to monitor blood pressure at home  Maintain strict control of hypertension with blood pressure goal below 130/90, diabetes with hemoglobin A1c goal below 6.5% and cholesterol with LDL cholesterol (bad cholesterol) goal below 70 mg/dL. I also advised the patient to eat a healthy diet with plenty of whole grains, cereals, fruits and vegetables, exercise regularly and maintain ideal body weight.  Followup in the future with me in 3 months or call earlier if needed       Thank you for coming to see [place] at [facility]. I hope we have been able to provide you high quality care today.  You may receive a patient satisfaction survey over the next few weeks. We would appreciate your feedback and comments so that we may continue to improve ourselves and the health of our patients.

## 2018-06-26 NOTE — Progress Notes (Signed)
I agree with the above plan 

## 2018-07-08 ENCOUNTER — Ambulatory Visit: Payer: Medicare HMO | Attending: Family Medicine | Admitting: Family Medicine

## 2018-07-08 ENCOUNTER — Encounter: Payer: Self-pay | Admitting: Family Medicine

## 2018-07-08 VITALS — BP 174/74 | HR 70 | Temp 98.1°F | Resp 18 | Ht 62.0 in | Wt 155.0 lb

## 2018-07-08 DIAGNOSIS — I69398 Other sequelae of cerebral infarction: Secondary | ICD-10-CM

## 2018-07-08 DIAGNOSIS — R5383 Other fatigue: Secondary | ICD-10-CM

## 2018-07-08 DIAGNOSIS — E1141 Type 2 diabetes mellitus with diabetic mononeuropathy: Secondary | ICD-10-CM

## 2018-07-08 DIAGNOSIS — Z8673 Personal history of transient ischemic attack (TIA), and cerebral infarction without residual deficits: Secondary | ICD-10-CM

## 2018-07-08 DIAGNOSIS — Z79899 Other long term (current) drug therapy: Secondary | ICD-10-CM

## 2018-07-08 DIAGNOSIS — Z87891 Personal history of nicotine dependence: Secondary | ICD-10-CM | POA: Insufficient documentation

## 2018-07-08 DIAGNOSIS — I672 Cerebral atherosclerosis: Secondary | ICD-10-CM

## 2018-07-08 DIAGNOSIS — Z882 Allergy status to sulfonamides status: Secondary | ICD-10-CM | POA: Insufficient documentation

## 2018-07-08 DIAGNOSIS — Z90710 Acquired absence of both cervix and uterus: Secondary | ICD-10-CM | POA: Insufficient documentation

## 2018-07-08 DIAGNOSIS — H5340 Unspecified visual field defects: Secondary | ICD-10-CM

## 2018-07-08 DIAGNOSIS — Z8249 Family history of ischemic heart disease and other diseases of the circulatory system: Secondary | ICD-10-CM | POA: Insufficient documentation

## 2018-07-08 DIAGNOSIS — I10 Essential (primary) hypertension: Secondary | ICD-10-CM | POA: Diagnosis not present

## 2018-07-08 DIAGNOSIS — H538 Other visual disturbances: Secondary | ICD-10-CM | POA: Insufficient documentation

## 2018-07-08 DIAGNOSIS — I7090 Unspecified atherosclerosis: Secondary | ICD-10-CM | POA: Insufficient documentation

## 2018-07-08 DIAGNOSIS — I6389 Other cerebral infarction: Secondary | ICD-10-CM | POA: Insufficient documentation

## 2018-07-08 LAB — GLUCOSE, POCT (MANUAL RESULT ENTRY): POC Glucose: 165 mg/dL — AB (ref 70–99)

## 2018-07-08 MED ORDER — VALSARTAN 160 MG PO TABS: 160.0000 mg | ORAL_TABLET | Freq: Every day | ORAL | 3 refills | Status: DC

## 2018-07-08 NOTE — Progress Notes (Signed)
Subjective:    Patient ID: Heather Rogers, female    DOB: [birth year], 72 y.o.   MRN: [id number]  HPI       72 yo female seen in follow-up of type 2 diabetes and hypertension and patient is status post CVA for which she was seen at the hospital on 05/15/2018 where CT showed moderate acute infarct in the left occipital lobe and lateral left thalamus lacunar infarct.  Since her last visit here, patient has followed up with neurology on 06/25/2018.  At patient's neurology visit her blood pressure was 172/82.  Patient has had echocardiogram on 05/15/2018 showing moderate LV hypertrophy and ejection fraction of 55 to 60%.  Patient has also had placement of a cardiac loop recorder to look for possible atrial fibrillation as a cause of her CVA.  Patient is participating in the [place] trial per neurology and therefore outside anticoagulants have not been prescribed.  Patient may be on aspirin 81 mg but could possibly also be on placebo as part of her current study.      At today's visit, patient states that she has some fatigue but overall feels well.  Patient denies headache or dizziness.  Patient reports that she is compliant with all of her medications.  Patient is accompanied by her daughter who patient states keeps track of her blood sugars and blood pressures.  Patient's daughter does have a planner with her and states that the patient's blood pressure has generally ranged from lowest of around 165 for the top number but is generally in the 170s to 180s over 80s to 90s.  Patient's blood sugars have generally been 120s or less fasting.  Patient states that this morning her blood sugar here was high compared to her usual blood sugars but patient did eat this morning before having her blood sugar checked here in the office.  Patient states that she had scrambled eggs with cheese as well as toast and coffee with creamer.  Patient states that she does not really measure the creamer but just pours in which she  believes is the right amount.  Patient denies any increased thirst or urinary frequency related to her blood sugars.  Patient has had one episode where her blood sugars were below 80 and she did feel slightly shaky at this time however generally her blood sugars have been within a normal range and she has felt well.  Patient does not feel as if she is having any increased muscle aches with the use of atorvastatin.  Patient denies any abdominal pain.  Patient continues to have a visual field defect related to her prior stroke.  Patient is not driving.  Patient denies any issues with dysphasia/difficulty swallowing.  Patient has had no new issues with onset of new visual difficulty, slurred speech and no onset of focal numbness or weakness.  Patient was seen by neurology since her last visit and patient also followed up with the clinical pharmacist here in the office.  Past Medical History:  Diagnosis Date  . Diabetes ([place])   . Hypertension   . Stroke (cerebrum) [facility])    Past Surgical History:  Procedure Laterality Date  . hysterectomy    . LOOP RECORDER INSERTION N/A 05/20/2018   Procedure: LOOP RECORDER INSERTION;  Surgeon: Thompson Grayer, MD;  Location: [facility];  Service: Cardiovascular;  Laterality: N/A;  . TEE WITHOUT CARDIOVERSION N/A 05/20/2018   Procedure: TRANSESOPHAGEAL ECHOCARDIOGRAM (TEE);  Surgeon: Sueanne Margarita, MD;  Location: [facility] ENDOSCOPY;  Service: Cardiovascular;  Laterality: N/A;   Family History  Problem Relation Age of Onset  . Hypertension Mother   . Stroke Mother   . Hypertension Father    Social History   Tobacco Use  . Smoking status: Former Research scientist (life sciences)  . Smokeless tobacco: Never Used  Substance Use Topics  . Alcohol use: Never    Frequency: Never  . Drug use: Never   Allergies  Allergen Reactions  . Sulfa Antibiotics     Itching       Review of Systems  Constitutional: Positive for fatigue (Occasional, mild). Negative for appetite change,  chills and fever.  HENT: Negative for sore throat and trouble swallowing.   Eyes: Positive for visual disturbance. Negative for photophobia, pain and redness.  Respiratory: Negative for cough and shortness of breath.   Cardiovascular: Negative for chest pain, palpitations and leg swelling.  Gastrointestinal: Negative for abdominal pain, blood in stool, constipation, diarrhea and nausea.  Endocrine: Negative for polydipsia, polyphagia and polyuria.  Genitourinary: Negative for dysuria and flank pain.  Musculoskeletal: Negative for back pain, gait problem and joint swelling.  Neurological: Negative for dizziness, speech difficulty, numbness and headaches.  Hematological: Negative for adenopathy. Does not bruise/bleed easily.       Objective:   Physical Exam BP (!) 195/74 (BP Location: Right Arm, Patient Position: Sitting, Cuff Size: Normal)   Pulse 78   Temp 98.1 F (36.7 C) (Oral)   Resp 18   Ht 5\' 2"  (1.575 m)   Wt 155 lb (70.3 kg)   SpO2 98%   BMI 28.35 kg/m Repeat blood pressure 174/74.  Nurse's notes and vital signs reviewed General-well-nourished, well-developed older female wearing glasses and sitting on exam table, patient is in no acute distress.  Patient is accompanied by her daughter at today's visit Neck-supple, no lymphadenopathy, no thyromegaly and no carotid bruits appreciated on today's exam Lungs-clear to auscultation bilaterally Cardiovascular-regular rate and rhythm Abdomen-soft, nontender Back-no CVA tenderness Extremities-no edema Neuro- grossly normal neuro and motor exam.  Patient with known visual disturbance status post CVA       Assessment & Plan:  1. Type 2 diabetes mellitus with diabetic mononeuropathy, without long-term current use of insulin (HCC) Daughter and patient reports that blood sugars at home have been controlled.  Patient did eat prior to today's visit and patient was random glucose of 165-patient had eggs with cheese, toast and coffee with  creamer within the past 2 hours.  Patient did meet with the clinical pharmacist since her last visit and patient continues to take Janumet XR but patient's glipizide was discontinued. - Glucose (CBG) - Basic Metabolic Panel  2. Hypertension, uncontrolled Patient with continued poorly controlled/uncontrolled hypertension.  Patient is being referred to cardiology for further evaluation and treatment.  Patient's losartan will be discontinued as this medication is not as effective as other medications in its class regarding lowering of blood pressure for most patients.  Patient will be placed on valsartan/Diovan 60 mg daily and should return to clinic in the next 1 to 2 weeks for re-check of blood pressure with clinical pharmacist.  Patient will continue the use of amlodipine as well as carvedilol in addition to valsartan.  Patient may require change to a different medication as there have been issues with recall of valsartan from some manufacturers. - Ambulatory referral to Cardiology - valsartan (DIOVAN) 160 MG tablet; Take 1 tablet (160 mg total) by mouth daily. To lower blood pressure  Dispense: 30 tablet; Refill: 3  3.  Atherosclerotic cerebrovascular disease Patient with atherosclerotic cerebrovascular disease and possible embolic CVA.  Patient currently has loop monitor to look for possible atrial fibrillation as a cause of her recent CVA the patient has also had issues with extremely high blood pressure.  Patient was actually seen at the emergency department for hypertensive urgency a few days prior to CVA.  Patient is currently on very high dose Lipitor therapy.  Patient has had neurology follow-up since her last visit and patient is currently either on 81 mg aspirin or a placebo as part of a study that she is enrolled with through neurology.  Patient will also be referred to cardiology for further evaluation.  Patient will have hepatic function panel checked in follow-up of her use of atorvastatin 80  mg daily - Ambulatory referral to Cardiology - Hepatic Function Panel  4. Visual field defect due to and not concurrent with cerebrovascular accident (CVA) Patient with residual visual field defect status post cerebrovascular accident.  Patient has been evaluated by ophthalmology as well as neurology and her symptoms are stable.  Patient has not returned to driving  5. Status post CVA Patient status post CVA with hypertensive urgency presentation at the emergency department a few days prior to later returned with findings consistent with CVA.  Patient is on high dose statin medication but patient's blood pressure has remained difficult to control and patient will be referred to cardiology for evaluation and suggestions regarding control of blood pressure/secondary stroke prevention - Ambulatory referral to Cardiology  6. Encounter for long-term (current) use of medications Patient will have a basic metabolic panel in follow-up of her diabetes as well as use of medications for control of hypertension and patient will have hepatic panel in follow-up of atorvastatin 80 mg use which can affect liver enzymes - Basic Metabolic Panel - Hepatic Function Panel  An After Visit Summary was printed and given to the patient.  Return in about 5 weeks (around 08/12/2018) for BP:1-2 weeks with [place]/CPP;.

## 2018-07-08 NOTE — Patient Instructions (Signed)
A new prescription for a blood pressure medication, valsartan 160 mg has been sent to your pharmacy.  Once you have obtained the new medication, please stop losartan 100 mg.  Continue use of amlodipine and carvedilol along with the new prescription, valsartan for control of blood pressure.  Please follow-up in 1 to 2 weeks with the clinical pharmacist for blood pressure recheck  Referral has been made to cardiology for further evaluation of uncontrolled hypertension

## 2018-07-09 LAB — HEPATIC FUNCTION PANEL
ALT: 12 IU/L (ref 0–32)
AST: 11 IU/L (ref 0–40)
Albumin: 4.3 g/dL (ref 3.5–4.8)
Alkaline Phosphatase: 81 IU/L (ref 39–117)
Bilirubin Total: 0.3 mg/dL (ref 0.0–1.2)
Bilirubin, Direct: 0.07 mg/dL (ref 0.00–0.40)
Total Protein: 7.3 g/dL (ref 6.0–8.5)

## 2018-07-09 LAB — BASIC METABOLIC PANEL WITH GFR
BUN/Creatinine Ratio: 12 (ref 12–28)
BUN: 12 mg/dL (ref 8–27)
CO2: 24 mmol/L (ref 20–29)
Calcium: 10.5 mg/dL — ABNORMAL HIGH (ref 8.7–10.3)
Chloride: 102 mmol/L (ref 96–106)
Creatinine, Ser: 0.99 mg/dL (ref 0.57–1.00)
GFR calc Af Amer: 66 mL/min/1.73
GFR calc non Af Amer: 57 mL/min/1.73 — ABNORMAL LOW
Glucose: 107 mg/dL — ABNORMAL HIGH (ref 65–99)
Potassium: 4.1 mmol/L (ref 3.5–5.2)
Sodium: 141 mmol/L (ref 134–144)

## 2018-07-11 ENCOUNTER — Other Ambulatory Visit: Payer: Self-pay | Admitting: Family Medicine

## 2018-07-21 ENCOUNTER — Telehealth: Payer: Self-pay | Admitting: *Deleted

## 2018-07-21 ENCOUNTER — Ambulatory Visit: Payer: Medicare HMO | Attending: Family Medicine | Admitting: Pharmacist

## 2018-07-21 ENCOUNTER — Encounter: Payer: Self-pay | Admitting: Pharmacist

## 2018-07-21 VITALS — BP 192/73 | HR 75

## 2018-07-21 DIAGNOSIS — Z87891 Personal history of nicotine dependence: Secondary | ICD-10-CM | POA: Insufficient documentation

## 2018-07-21 DIAGNOSIS — Z8249 Family history of ischemic heart disease and other diseases of the circulatory system: Secondary | ICD-10-CM | POA: Insufficient documentation

## 2018-07-21 DIAGNOSIS — I10 Essential (primary) hypertension: Secondary | ICD-10-CM | POA: Insufficient documentation

## 2018-07-21 MED ORDER — VALSARTAN 320 MG PO TABS: 320.0000 mg | ORAL_TABLET | Freq: Every day | ORAL | 2 refills | Status: DC

## 2018-07-21 NOTE — Telephone Encounter (Signed)
-----   Message from Antony Blackbird, MD sent at 07/16/2018 10:01 AM EST ----- Please notify patient that her recent blood work showed that her glucose was 107.  Patient with normal hepatic function panel.  Patient should continue her current medications for diabetes and hyperlipidemia

## 2018-07-21 NOTE — Telephone Encounter (Signed)
Medical Assistant left message on patient's home and cell voicemail. Voicemail states to give a call back to [place] with [facility] at [phone]. Patient is aware of labs being normal and to continue with current medications.

## 2018-07-21 NOTE — Patient Instructions (Signed)
Thank you for coming to see [place] today.   Blood pressure today is elevated.   Continue taking carvedilol and amlodipine as prescribed.   I am increasing your valsartan to 320 mg daily.   Limiting salt and caffeine, as well as exercising as able for at least 30 minutes for 5 days out of the week, can also help you lower your blood pressure.  Take your blood pressure at home if you are able. Please write down these numbers and bring them to your visits.  If you have any questions about medications, please call me [phone].  Lurena Joiner

## 2018-07-21 NOTE — Progress Notes (Signed)
    S:    PCP: Dr. Chapman Fitch  No chief complaint on file.  Patient arrives in good spirits. She is accompanied by her daughter today. Presents for HTN management at the request of Dr. Chapman Fitch. Patient was referred on 07/08/18. Pt's losartan was changed to valsartan.  Today, pt reports feeling well. She denies chest pain, dizziness, blurred vision, or shortness of breath.    Patient reports adherence with medications.  Current hypertension medications include: amlodipine 10 mg daily, carvedilol 25 mg BID, valsartan 160 mg   Family/Social History: HTN (mother, father) former smoker, denies alcohol   Insurance coverage/medication affordability: A[employer]   O:  BP in L arm after 5 minutes rest: 192/73, HR 75  Lab Results  Component Value Date   HGBA1C 10.9 (H) 05/16/2018   There were no vitals filed for this visit.  Lipid Panel     Component Value Date/Time   CHOL 126 05/16/2018 0420   TRIG 175 (H) 05/16/2018 0420   HDL 27 (L) 05/16/2018 0420   CHOLHDL 4.7 05/16/2018 0420   VLDL 35 05/16/2018 0420   LDLCALC 64 05/16/2018 0420   Clinical ASCVD: Yes - stroke  A/P: Hypertension longstanding currently uncontrolled.  BP goal less stringent per PCP. Patient is adherent with medication. Will increase valsartan to 320 mg daily.  -Increased valsartan to 320 mg daily -Continued amlodipine and carvedilol   Written patient instructions provided. Total time in face to face counseling 15 minutes.   Follow up PCP Clinic Visit 08/14/2018.     Heather Rogers, PharmD, [place] [phone]

## 2018-07-22 ENCOUNTER — Ambulatory Visit: Payer: Self-pay | Admitting: Pharmacist

## 2018-07-25 ENCOUNTER — Ambulatory Visit (INDEPENDENT_AMBULATORY_CARE_PROVIDER_SITE_OTHER): Payer: 59

## 2018-07-25 DIAGNOSIS — I639 Cerebral infarction, unspecified: Secondary | ICD-10-CM

## 2018-07-27 LAB — CUP PACEART REMOTE DEVICE CHECK
Date Time Interrogation Session: 20191227150734
MDC IDC PG IMPLANT DT: 20191022

## 2018-07-28 NOTE — Progress Notes (Signed)
[facility] Summary Report / Loop Recorder 

## 2018-08-10 ENCOUNTER — Other Ambulatory Visit: Payer: Self-pay | Admitting: Internal Medicine

## 2018-08-10 LAB — CUP PACEART REMOTE DEVICE CHECK
Date Time Interrogation Session: 20191124151114
Implantable Pulse Generator Implant Date: 20191022

## 2018-08-14 ENCOUNTER — Ambulatory Visit: Payer: Self-pay | Admitting: Family Medicine

## 2018-08-14 ENCOUNTER — Telehealth: Payer: Self-pay | Admitting: Family Medicine

## 2018-08-14 MED ORDER — AMLODIPINE BESYLATE 10 MG PO TABS: 10.0000 mg | ORAL_TABLET | Freq: Every day | ORAL | 0 refills | Status: DC

## 2018-08-14 NOTE — Telephone Encounter (Signed)
1) Medication(s) Requested (by name):AMLODIPINE  2) [facility].  3) Special Requests:NO REFILLS REMAIN. Fulp appt today canceled provider called out.   Approved medications will be sent to the pharmacy, we will reach out if there is an issue.  Requests made after 3pm may not be addressed until the following business day!  If a patient is unsure of the name of the medication(s) please note and ask patient to call back when they are able to provide all info, do not send to responsible party until all information is available!

## 2018-08-20 ENCOUNTER — Ambulatory Visit: Payer: Medicare HMO | Attending: Family Medicine | Admitting: Physician Assistant

## 2018-08-20 VITALS — BP 144/72 | HR 66 | Temp 97.8°F | Resp 16 | Wt 151.2 lb

## 2018-08-20 DIAGNOSIS — Z8739 Personal history of other diseases of the musculoskeletal system and connective tissue: Secondary | ICD-10-CM | POA: Diagnosis not present

## 2018-08-20 DIAGNOSIS — I10 Essential (primary) hypertension: Secondary | ICD-10-CM | POA: Diagnosis not present

## 2018-08-20 DIAGNOSIS — E1141 Type 2 diabetes mellitus with diabetic mononeuropathy: Secondary | ICD-10-CM

## 2018-08-20 LAB — GLUCOSE, POCT (MANUAL RESULT ENTRY): POC GLUCOSE: 108 mg/dL — AB (ref 70–99)

## 2018-08-20 MED ORDER — GLUCOSE BLOOD VI STRP: ORAL_STRIP | 12 refills | Status: DC

## 2018-08-20 MED ORDER — AMLODIPINE BESYLATE 10 MG PO TABS: 10.0000 mg | ORAL_TABLET | Freq: Every day | ORAL | 2 refills | Status: DC

## 2018-08-20 MED ORDER — ONETOUCH DELICA LANCETS 33G MISC: 12 refills | Status: DC

## 2018-08-20 NOTE — Progress Notes (Signed)
Patient ID: Heather Rogers, female   DOB: August 18, 1945, 73 y.o.   MRN: [id number]   Heather Rogers, is a 73 y.o. female  [id number]  [id number]  DOB - [birth year]  Subjective:  Chief Complaint and HPI: Heather Rogers is a 73 y.o. female here today for RF of amlodipine and glucose check.  Blood sugars running from 70s to 120s.  Bp at home running 130-150/50-80.  Previously was placed on allopurinol for high uric acid levels but she doesn't remember ever having gout.  She has been out of allopurinol since December 2019. No concerns/complaints today.  Daughter with her and both give history.   ROS:   Constitutional:  No f/c, No night sweats, No unexplained weight loss. EENT:  No vision changes, No blurry vision, No hearing changes. No mouth, throat, or ear problems.  Respiratory: No cough, No SOB Cardiac: No CP, no palpitations GI:  No abd pain, No N/V/D. GU: No Urinary s/sx Musculoskeletal: No joint pain Neuro: No headache, no dizziness, no motor weakness.  Skin: No rash Endocrine:  No polydipsia. No polyuria.  Psych: Denies SI/HI  No problems updated.  ALLERGIES: Allergies  Allergen Reactions  . Sulfa Antibiotics     Itching     PAST MEDICAL HISTORY: Past Medical History:  Diagnosis Date  . Diabetes ([place])   . Hypertension   . Stroke (cerebrum) [facility])     MEDICATIONS AT HOME: Prior to Admission medications   Medication Sig Start Date End Date Taking? Authorizing Provider  allopurinol (ZYLOPRIM) 100 MG tablet Take 1 tablet (100 mg total) by mouth daily. 06/20/18   Fulp, Cammie, MD  amLODipine (NORVASC) 10 MG tablet Take 1 tablet (10 mg total) by mouth daily. 08/20/18   Argentina Donovan, PA-C  atorvastatin (LIPITOR) 80 MG tablet TAKE ONE TABLET BY MOUTH DAILY AT [facility] 07/11/18   Fulp, Cammie, MD  carvedilol (COREG) 25 MG tablet Take 1 tablet (25 mg total) by mouth 2 (two) times daily with a meal. 05/27/18   Ena Dawley, Tiffany S, PA-C  gabapentin (NEURONTIN) 100 MG  capsule Take 1 capsule (100 mg total) by mouth 3 (three) times daily. 05/27/18   Brayton Caves, PA-C  SitaGLIPtin-MetFORMIN HCl (JANUMET XR) [id number] MG TB24 Take 1 tablet by mouth daily. 05/27/18   Brayton Caves, PA-C  Study - [place] - apixaban 2.5mg  or Placebo Take 2.5 mg by mouth 2 (two) times daily.    [provider]  Study - [place] - aspirin 81mg  or Placebo Take 81 mg by mouth daily.    [provider]  valsartan (DIOVAN) 320 MG tablet Take 1 tablet (320 mg total) by mouth daily. 07/21/18   Fulp, Cammie, MD     Objective:  EXAM:   Vitals:   08/20/18 1059  BP: (!) 144/72  Pulse: 66  Resp: 16  Temp: 97.8 F (36.6 C)  TempSrc: Oral  SpO2: 100%  Weight: 151 lb 3.2 oz (68.6 kg)    General appearance : A&OX3. NAD. Non-toxic-appearing HEENT: Atraumatic and Normocephalic.  PERRLA. EOM intact.   Chest/Lungs:  Breathing-non-labored, Good air entry bilaterally, breath sounds normal without rales, rhonchi, or wheezing  CVS: S1 S2 regular, no murmurs, gallops, rubs  Extremities: Bilateral Lower Ext shows no edema, both legs are warm to touch with = pulse throughout Neurology:  CN II-XII grossly intact, Non focal.   Psych:  TP linear. J/I WNL. Normal speech. Appropriate eye contact and affect.  Skin:  No Rash  Data Review Lab Results  Component Value Date   HGBA1C 10.9 (H) 05/16/2018     Assessment & Plan   1. Type 2 diabetes mellitus with diabetic mononeuropathy, without long-term current use of insulin (HCC) Controlled-continue current regimen.  - Glucose (CBG) - Hemoglobin A1c  2. Essential hypertension Continue current regimen - amLODipine (NORVASC) 10 MG tablet; Take 1 tablet (10 mg total) by mouth daily.  Dispense: 90 tablet; Refill: 2  3. History of gout With no h/o attack that she can remember.  If uric acid levels are high, we can resume allopurinol.   - Uric Acid  Patient have been counseled extensively about nutrition and  exercise  Return in about 3 months (around 11/19/2018) for cammie Fulp for DM and htn.  The patient was given clear instructions to go to ER or return to medical center if symptoms don't improve, worsen or new problems develop. The patient verbalized understanding. The patient was told to call to get lab results if they haven't heard anything in the next week.     Freeman Caldron, PA-C [facility], [place]   08/20/2018, 11:20 AM

## 2018-08-21 LAB — HEMOGLOBIN A1C
Est. average glucose Bld gHb Est-mCnc: 137 mg/dL
Hgb A1c MFr Bld: 6.4 % — ABNORMAL HIGH (ref 4.8–5.6)

## 2018-08-21 LAB — URIC ACID: Uric Acid: 6.2 mg/dL (ref 2.5–7.1)

## 2018-08-22 ENCOUNTER — Telehealth: Payer: Self-pay

## 2018-08-22 NOTE — Telephone Encounter (Signed)
Contacted pt to go over lab results pt didn't answer lvm asking pt to give me a call at her earliest convenience  

## 2018-08-25 ENCOUNTER — Other Ambulatory Visit: Payer: Self-pay

## 2018-08-25 DIAGNOSIS — E1141 Type 2 diabetes mellitus with diabetic mononeuropathy: Secondary | ICD-10-CM

## 2018-08-25 MED ORDER — GLUCOSE BLOOD VI STRP: ORAL_STRIP | 12 refills | Status: DC

## 2018-08-25 MED ORDER — ACCU-CHEK GUIDE W/DEVICE KIT: 1.0000 | PACK | 0 refills | Status: DC

## 2018-08-25 MED ORDER — ACCU-CHEK FASTCLIX LANCETS MISC: 12 refills | Status: DC

## 2018-08-27 ENCOUNTER — Ambulatory Visit (INDEPENDENT_AMBULATORY_CARE_PROVIDER_SITE_OTHER): Payer: Medicare HMO

## 2018-08-27 DIAGNOSIS — I639 Cerebral infarction, unspecified: Secondary | ICD-10-CM | POA: Diagnosis not present

## 2018-08-28 NOTE — Progress Notes (Signed)
[facility] Summary Report / Loop Recorder 

## 2018-08-29 LAB — CUP PACEART REMOTE DEVICE CHECK
Date Time Interrogation Session: 20200129153625
Implantable Pulse Generator Implant Date: 20191022

## 2018-09-14 ENCOUNTER — Other Ambulatory Visit: Payer: Self-pay | Admitting: Family Medicine

## 2018-09-19 ENCOUNTER — Other Ambulatory Visit: Payer: Self-pay | Admitting: Family Medicine

## 2018-09-21 ENCOUNTER — Other Ambulatory Visit: Payer: Self-pay | Admitting: Physician Assistant

## 2018-09-22 ENCOUNTER — Other Ambulatory Visit: Payer: Self-pay | Admitting: Physician Assistant

## 2018-09-23 ENCOUNTER — Other Ambulatory Visit: Payer: Self-pay | Admitting: Physician Assistant

## 2018-09-23 ENCOUNTER — Telehealth: Payer: Self-pay | Admitting: Family Medicine

## 2018-09-25 ENCOUNTER — Other Ambulatory Visit: Payer: Self-pay

## 2018-09-25 ENCOUNTER — Encounter: Payer: Self-pay | Admitting: Adult Health

## 2018-09-25 ENCOUNTER — Ambulatory Visit (INDEPENDENT_AMBULATORY_CARE_PROVIDER_SITE_OTHER): Payer: Private Health Insurance - Indemnity | Admitting: Adult Health

## 2018-09-25 VITALS — BP 184/78 | HR 69 | Resp 18 | Ht 62.0 in | Wt 148.8 lb

## 2018-09-25 DIAGNOSIS — E119 Type 2 diabetes mellitus without complications: Secondary | ICD-10-CM

## 2018-09-25 DIAGNOSIS — I63432 Cerebral infarction due to embolism of left posterior cerebral artery: Secondary | ICD-10-CM

## 2018-09-25 DIAGNOSIS — E785 Hyperlipidemia, unspecified: Secondary | ICD-10-CM

## 2018-09-25 DIAGNOSIS — I10 Essential (primary) hypertension: Secondary | ICD-10-CM | POA: Diagnosis not present

## 2018-09-25 DIAGNOSIS — H53461 Homonymous bilateral field defects, right side: Secondary | ICD-10-CM

## 2018-09-25 NOTE — Progress Notes (Signed)
[facility] [address] [place]. [place] [address] [phone]       OFFICE FOLLOW UP NOTE  Ms. Heather Rogers Date of Birth:  1946-07-10 Medical Record Number:  [id number]   Reason for Referral:  hospital stroke follow up  CHIEF COMPLAINT:  Chief Complaint  Patient presents with  . CVA 05/15/18    Treatment Room. Here with dtr. [name]. Denies new stroke sx. Reports compliance witih [place] trial and Lipitor.. Is not driving again yet--has not seen opthalmologist yet/fim    HPI: 09/25/18 VISIT  Heather Rogers is a 73 year old female who is being seen today for follow-up visit regarding left embolic PCA infarct in 28/3151 and is accompanied by her daughter.  She has been stable from a stroke standpoint with residual deficits of right homonymous superior quadrantanopia.  She has not followed up with ophthalmology at this time as she attempted to schedule evaluation with retina specialist due to history of diabetes but needed referral first.  She continues to live independently where she maintains all ADLs and majority of IADLs with minimal assistance and bill paying and cooking.  She continues to participate in [place] trial and is currently receiving blinded blood thinners for secondary stroke prevention.  Continues on atorvastatin without side effects myalgias.  Glucose levels at home have been averaging from 70-90.  Daughter is concerned due to decreased appetite as she typically will not eat lunch as she just does not feel hungry.  She has lost approximately 30 pounds since her stroke back in 04/2018.  She plans on following up with PCP for possible needed diabetic medication adjustment due to frequent episodes of hypoglycemia.  Blood pressure today elevated at 184/78 but she does continue to monitor at home which typically ranges 150s/70s.  She continues to be followed by PCP for ongoing HTN management.  Loop recorder has not shown atrial fibrillation thus far.  Denies  new or worsening stroke/TIA symptoms.    INITIAL VISIT 06/25/2018: Heather Rogers is being seen today for initial visit in the office for left embolic PCA infarct secondary to undetermined source on 05/15/2018. History obtained from patient, daughter and chart review. Reviewed all radiology images and labs personally.  Heather Rogers is a 73 y.o. female with history of HTN and DB  who presented with blurred vision, hypertensive urgency and altered sensation R face.  According to notes, her visual symptoms began approximately 6 days prior to ED admission and as she was being brought to her ophthalmologist appointment by a neighbor, she started to complain of right-sided numbness therefore EMS was called.  CT head reviewed and showed subacute left occipital stroke with left thalamic lacunae.  MRI brain reviewed and showed left PCA occlusion and left PCA infarct along with old left thalamic lacunae and small vessel disease.  MRA head negative.  MRA neck showed moderate to severe left ICA petrous and moderate right ICA cavernous segment stenosis with moderate mid BA stenosis.  Carotid Doppler showed bilateral ICA 1 to 39% stenosis.  2D echo showed an EF of 55 to 60% without cardiac source of embolus identified.  It was determined that left PCA infarct was embolic secondary to unknown source, is recommended to undergo TEE with possible loop recorder placement as outpatient.  Patient was initially found to be in hypertensive urgency with BP 196/83 but normalized throughout admission and recommended long-term BP goal normotensive range.  LDL 64 and recommended atorvastatin 80 mg daily.  A1c 10.9 and recommended tight  glycemic control with close PCP follow-up for DM management.  Patient was not on antithrombotic PTA and recommended DAPT for 3 weeks and aspirin alone.  Of note, the patient has a history of some noncompliance noncompliance with her medications including blood pressure medications and only  occasionally will take her aspirin.  She does have 2 neighbors that monitor her and assist with her care along with ensuring glucose monitoring and medication administration.  Patient was discharged with recommendations of home health PT/OT in stable condition.  Patient is being seen today for hospital follow-up.  She did undergo TEE which did not show PFO or cardiac source of embolus therefore loop recorder placed to rule out atrial fibrillation as potential cause of infarct. She is currently participating in [place] trial. She is unsure if vision has improved since discharge and continues to have blurry vision in right periphery. She was evaluated by home therapies and after the first assessment, it was determined that patient did not need additional therapies.  Blood pressure today 172/82 and as she does monitor at home typically 179/82 (dauhter states typically in the 170s). She continues to be followed by PCP due to continued difficulties maintaining blood pressure. She does monitor glucose at home and being followed by PCP as well for management. She does live on her own but daughter helps with cooking which is new since her stroke. She also has not returned to driving which her daughter helps with transportation. She has not followed up with ophthalmologist since discharge. Daughter is planning on finding a new ophthalmologist as the ophthalmologist she was at when the stroke occurred apparently does not accept her insurance.  She has no further concerns at this time.  Denies new or worsening stroke/TIA symptoms.    ROS:   14 system review of systems performed and negative with exception of see HPI  PMH:  Past Medical History:  Diagnosis Date  . Diabetes ([place])   . Hypertension   . Stroke (cerebrum) (HCC)     PSH:  Past Surgical History:  Procedure Laterality Date  . hysterectomy    . LOOP RECORDER INSERTION N/A 05/20/2018   Procedure: LOOP RECORDER INSERTION;  Surgeon: Thompson Grayer, MD;   Location: [facility];  Service: Cardiovascular;  Laterality: N/A;  . TEE WITHOUT CARDIOVERSION N/A 05/20/2018   Procedure: TRANSESOPHAGEAL ECHOCARDIOGRAM (TEE);  Surgeon: Sueanne Margarita, MD;  Location: [facility] ENDOSCOPY;  Service: Cardiovascular;  Laterality: N/A;    Social History:  Social History   Socioeconomic History  . Marital status: Divorced    Spouse name: Not on file  . Number of children: Not on file  . Years of education: Not on file  . Highest education level: Not on file  Occupational History  . Not on file  Social Needs  . Financial resource strain: Not on file  . Food insecurity:    Worry: Not on file    Inability: Not on file  . Transportation needs:    Medical: Not on file    Non-medical: Not on file  Tobacco Use  . Smoking status: Former Research scientist (life sciences)  . Smokeless tobacco: Never Used  Substance and Sexual Activity  . Alcohol use: Never    Frequency: Never  . Drug use: Never  . Sexual activity: Not Currently  Lifestyle  . Physical activity:    Days per week: Not on file    Minutes per session: Not on file  . Stress: Not on file  Relationships  . Social connections:  Talks on phone: Not on file    Gets together: Not on file    Attends religious service: Not on file    Active member of club or organization: Not on file    Attends meetings of clubs or organizations: Not on file    Relationship status: Not on file  . Intimate partner violence:    Fear of current or ex partner: Not on file    Emotionally abused: Not on file    Physically abused: Not on file    Forced sexual activity: Not on file  Other Topics Concern  . Not on file  Social History Narrative   Lives in [place].    Family History:  Family History  Problem Relation Age of Onset  . Hypertension Mother   . Stroke Mother   . Hypertension Father     Medications:   Current Outpatient Medications on File Prior to Visit  Medication Sig Dispense Refill  . ACCU-CHEK FASTCLIX  LANCETS MISC Check blood sugar fasting and before meals and again if pt feels bad (symptoms of hypo). 100 each 12  . amLODipine (NORVASC) 10 MG tablet Take 1 tablet (10 mg total) by mouth daily. 90 tablet 2  . atorvastatin (LIPITOR) 80 MG tablet TAKE ONE TABLET BY MOUTH DAILY AT 6PM 30 tablet 2  . Blood Glucose Monitoring Suppl (ACCU-CHEK GUIDE) w/Device KIT 1 each by Does not apply route as directed. 1 kit 0  . carvedilol (COREG) 25 MG tablet TAKE ONE TABLET BY MOUTH TWICE A DAY WITH MEALS 60 tablet 2  . gabapentin (NEURONTIN) 100 MG capsule TAKE ONE CAPSULE BY MOUTH THREE TIMES A DAY 90 capsule 2  . glucose blood (ACCU-CHEK GUIDE) test strip Check blood sugar fasting and before meals and again if pt feels bad (symptoms of hypo). 100 each 12  . JANUMET XR [phone] MG TB24 TAKE ONE TABLET BY MOUTH DAILY 30 tablet 2  . Study - [place] - apixaban 2.17m or Placebo Take 2.5 mg by mouth 2 (two) times daily.    . Study - [place] - aspirin 861mor Placebo Take 81 mg by mouth daily.    . valsartan (DIOVAN) 320 MG tablet TAKE ONE TABLET BY MOUTH DAILY 30 tablet 1  . allopurinol (ZYLOPRIM) 100 MG tablet Take 1 tablet (100 mg total) by mouth daily. 30 tablet 0   No current facility-administered medications on file prior to visit.     Allergies:   Allergies  Allergen Reactions  . Sulfa Antibiotics     Itching      Physical Exam  Vitals:   09/25/18 1012  BP: (!) 184/78  Pulse: 69  Resp: 18  Weight: 148 lb 12.8 oz (67.5 kg)  Height: _0  (1.575 m)   Body mass index is 27.22 kg/m. No exam data present  General: well developed, well nourished, pleasant elderly African-American female, seated, in no evident distress Head: head normocephalic and atraumatic.   Neck: supple with no carotid or supraclavicular bruits Cardiovascular: regular rate and rhythm, no murmurs Musculoskeletal: no deformity Skin:  no rash/petichiae Vascular:  Normal pulses all extremities  Neurologic Exam Mental  Status: Awake and fully alert. Oriented to place and time. Recent and remote memory intact. Attention span, concentration and fund of knowledge appropriate. Mood and affect appropriate.  Cranial Nerves: Pupils equal, briskly reactive to light. Extraocular movements full without nystagmus. Visual fields right homonymous superior quadrantanopia. Hearing intact. Facial sensation intact. Face, tongue, palate moves normally and symmetrically.  Motor:  Normal bulk and tone. Normal strength in all tested extremity muscles. Sensory.: intact to touch , pinprick , position and vibratory sensation.  Coordination: Rapid alternating movements normal in all extremities. Finger-to-nose and heel-to-shin performed accurately bilaterally. Gait and Station: Arises from chair without difficulty. Stance is normal. Gait demonstrates normal stride length and balance.  Reflexes: 1+ and symmetric. Toes downgoing.       Diagnostic Data (Labs, Imaging, Testing)  CT HEAD WO CONTRAST 05/15/18 IMPRESSION: 1. Moderate acute infarct in the left occipital lobe. 2. Lateral left thalamus lacune.  MR BRAIN WO CONTRAST MR MRA HEAD  MR MRA NECK 05/15/2018 IMPRESSION: 1. Positive for segmental Left PCA occlusion with acute to subacute patchy Left PCA infarct. Territory from the left hippocampus through the medial left occipital pole is affected with edema but no associated hemorrhage or mass effect. 2. Neck MRA is negative for hemodynamically significant stenosis in the neck (50% Left ICA origin stenosis), but Head MRA is positive for: - moderate to severe Left ICA petrous segment and moderate Right ICA cavernous segment stenoses. - moderate mid Basilar Artery stenosis. 3. Chronic small vessel ischemia in the left thalamus. Mild to moderate for age cerebral white matter signal changes also probably small vessel disease related.  ECHOCARDIOGRAM 05/15/2018 Impressions: - Normal LV size with moderate LV hypertrophy.  EF 55-60%. Normal RV   size and systolic function. No significant valvular   abnormalities.  ECHO TEE 05/20/2018 Study Conclusions - Left ventricle: Systolic function was normal. The estimated   ejection fraction was in the range of 55% to 60%. Wall motion was   normal; there were no regional wall motion abnormalities. - Aortic valve: Trileaflet; mildly thickened, mildly calcified   leaflets. - Mitral valve: There was mild regurgitation. - Left atrium: The atrium was mildly dilated. No evidence of   thrombus in the atrial cavity or appendage. - Right atrium: No evidence of thrombus in the atrial cavity or   appendage. - Atrial septum: No defect or patent foramen ovale was identified. - Tricuspid valve: There was mild regurgitation. - Pulmonic valve: There was trivial regurgitation. Impressions: - No cardiac source of emboli was indentified    ASSESSMENT: Heather Rogers is a 73 y.o. year old female here with left embolic PCA infarct on 17/40/8144 secondary to unknown source therefore loop recorder placed on 05/20/2018. Vascular risk factors include HTN, HLD and DM.  She returns today for follow-up visit with residual deficits of right homonymous superior quadrantanopia but otherwise stable from stroke standpoint.    PLAN:  1. Left PCA infarct: Continue atorvastatin for secondary stroke prevention.  Patient currently participating in [place] trial therefore blood thinners will be managed during trial participation.  Maintain strict control of hypertension with blood pressure goal below 130/90, diabetes with hemoglobin A1c goal below 6.5% and cholesterol with LDL cholesterol (bad cholesterol) goal below 70 mg/dL.  I also advised the patient to eat a healthy diet with plenty of whole grains, cereals, fruits and vegetables, exercise regularly with at least 30 minutes of continuous activity daily and maintain ideal body weight. 2. Right homonymous superior quadrantanopia: Referral  placed to [facility] for routine monitoring for diabetes along with obtaining peripheral vision testing 3. Cryptogenic etiology: Continue to monitor loop recorder for possible atrial fibrillation.  Continue participation in [place] trial 4. HTN: Advised to continue current treatment regimen.  Today's BP 184/78.  Advised to continue to monitor at home along with continued follow-up with PCP for management 5. HLD: Advised  to continue current treatment regimen along with continued follow-up with PCP for future prescribing and monitoring of lipid panel 6. DMII: Advised to continue to monitor glucose levels at home along with continued follow-up with PCP for management and monitoring with possible need of diabetic medication management due to frequent hypoglycemic episodes 7. Recommended she continue to follow with her PCP for concerns regarding decreased appetite and ongoing weight loss.  Highly encouraged eating lunch to avoid hypoglycemic episodes   As she is currently being followed for [place] trial, no need to schedule follow-up appointment at this time but did advise to call office with questions, concerns or need of future follow-up appointment   Greater than 50% of time during this 25 minute visit was spent on counseling, explanation of diagnosis of left PCA infarct, reviewing risk factor management of HLD, HTN and DM, planning of further management along with potential future management, and discussion with patient and family answering all questions.    Venancio Poisson, AGNP-BC  [facility] [address] [place], [place] [address]  Phone [phone] Fax [phone] Note: This document was prepared with digital dictation and possible smart phrase technology. Any transcriptional errors that result from this process are unintentional.

## 2018-09-25 NOTE — Progress Notes (Signed)
I agree with the above plan 

## 2018-09-25 NOTE — Patient Instructions (Signed)
Continue Participation in [place] trial  and continue Lipitor for secondary stroke prevention  Continue to follow up with PCP regarding cholesterol, diabetes and blood pressure management   Referral placed to retina specialist - they will call you to set up appointment  Continue to monitor blood pressure at home  Maintain strict control of hypertension with blood pressure goal below 130/90, diabetes with hemoglobin A1c goal below 6.5% and cholesterol with LDL cholesterol (bad cholesterol) goal below 70 mg/dL. I also advised the patient to eat a healthy diet with plenty of whole grains, cereals, fruits and vegetables, exercise regularly and maintain ideal body weight.        Thank you for coming to see [place] at [facility]. I hope we have been able to provide you high quality care today.  You may receive a patient satisfaction survey over the next few weeks. We would appreciate your feedback and comments so that we may continue to improve ourselves and the health of our patients.

## 2018-09-29 ENCOUNTER — Ambulatory Visit (INDEPENDENT_AMBULATORY_CARE_PROVIDER_SITE_OTHER): Payer: Private Health Insurance - Indemnity | Admitting: *Deleted

## 2018-09-29 DIAGNOSIS — I639 Cerebral infarction, unspecified: Secondary | ICD-10-CM | POA: Diagnosis not present

## 2018-09-29 LAB — CUP PACEART REMOTE DEVICE CHECK
Implantable Pulse Generator Implant Date: 20191022
MDC IDC SESS DTM: 20200302144501

## 2018-10-07 NOTE — Progress Notes (Signed)
[facility] Summary Report / Loop Recorder 

## 2018-10-13 MED ORDER — ATORVASTATIN CALCIUM 80 MG PO TABS: 80.0000 mg | ORAL_TABLET | Freq: Every day | ORAL | 0 refills | Status: DC

## 2018-10-13 NOTE — Telephone Encounter (Signed)
1) Medication(s) Requested (by name):atorvastatin (LIPITOR) 80 MG tablet [[id number]]    2) [id number] -   3) Special Requests:   Approved medications will be sent to the pharmacy, we will reach out if there is an issue.  Requests made after 3pm may not be addressed until the following business day!  If a patient is unsure of the name of the medication(s) please note and ask patient to call back when they are able to provide all info, do not send to responsible party until all information is available!

## 2018-11-03 ENCOUNTER — Other Ambulatory Visit: Payer: Self-pay

## 2018-11-03 ENCOUNTER — Ambulatory Visit (INDEPENDENT_AMBULATORY_CARE_PROVIDER_SITE_OTHER): Payer: Private Health Insurance - Indemnity | Admitting: *Deleted

## 2018-11-03 DIAGNOSIS — I639 Cerebral infarction, unspecified: Secondary | ICD-10-CM

## 2018-11-04 LAB — CUP PACEART REMOTE DEVICE CHECK
Date Time Interrogation Session: 20200404173933
Implantable Pulse Generator Implant Date: 20191022

## 2018-11-11 ENCOUNTER — Other Ambulatory Visit: Payer: Self-pay | Admitting: Family Medicine

## 2018-11-11 NOTE — Progress Notes (Signed)
[facility] Summary Report / Loop Recorder 

## 2018-11-18 ENCOUNTER — Ambulatory Visit: Payer: Self-pay | Admitting: Family Medicine

## 2018-11-19 ENCOUNTER — Other Ambulatory Visit: Payer: Self-pay | Admitting: Physician Assistant

## 2018-11-21 ENCOUNTER — Encounter: Payer: Self-pay | Admitting: Family Medicine

## 2018-11-21 ENCOUNTER — Other Ambulatory Visit: Payer: Self-pay

## 2018-11-21 ENCOUNTER — Other Ambulatory Visit: Payer: Self-pay | Admitting: Pharmacist

## 2018-11-21 ENCOUNTER — Ambulatory Visit: Payer: Medicare HMO | Attending: Family Medicine | Admitting: Family Medicine

## 2018-11-21 DIAGNOSIS — I10 Essential (primary) hypertension: Secondary | ICD-10-CM | POA: Diagnosis not present

## 2018-11-21 DIAGNOSIS — Z8673 Personal history of transient ischemic attack (TIA), and cerebral infarction without residual deficits: Secondary | ICD-10-CM | POA: Diagnosis not present

## 2018-11-21 DIAGNOSIS — I672 Cerebral atherosclerosis: Secondary | ICD-10-CM

## 2018-11-21 DIAGNOSIS — E1141 Type 2 diabetes mellitus with diabetic mononeuropathy: Secondary | ICD-10-CM

## 2018-11-21 DIAGNOSIS — Z7902 Long term (current) use of antithrombotics/antiplatelets: Secondary | ICD-10-CM

## 2018-11-21 DIAGNOSIS — Z79899 Other long term (current) drug therapy: Secondary | ICD-10-CM

## 2018-11-21 MED ORDER — ATORVASTATIN CALCIUM 80 MG PO TABS: 80.0000 mg | ORAL_TABLET | Freq: Every day | ORAL | 4 refills | Status: DC

## 2018-11-21 MED ORDER — CARVEDILOL 25 MG PO TABS: 25.0000 mg | ORAL_TABLET | Freq: Two times a day (BID) | ORAL | 11 refills | Status: DC

## 2018-11-21 MED ORDER — SITAGLIPTIN PHOS-METFORMIN HCL 50-500 MG PO TABS: ORAL_TABLET | ORAL | 6 refills | Status: DC

## 2018-11-21 MED ORDER — VALSARTAN 320 MG PO TABS: 320.0000 mg | ORAL_TABLET | Freq: Every day | ORAL | 11 refills | Status: DC

## 2018-11-21 MED ORDER — GABAPENTIN 100 MG PO CAPS: 100.0000 mg | ORAL_CAPSULE | Freq: Three times a day (TID) | ORAL | 11 refills | Status: DC

## 2018-11-21 MED ORDER — ATORVASTATIN CALCIUM 80 MG PO TABS: 80.0000 mg | ORAL_TABLET | Freq: Every day | ORAL | 0 refills | Status: DC

## 2018-11-21 NOTE — Progress Notes (Signed)
Virtual Visit via Video Note  I connected with Heather Rogers on 11/21/18 at  3:50 PM EDT by a video enabled telemedicine application and verified that I am speaking with the correct person using two identifiers.   I discussed the limitations of evaluation and management by telemedicine and the availability of in person appointments. The patient expressed understanding and agreed to proceed.  Patient location: Home Provider location: Office Others on video visit; [name] Doggett-patient's daughter  History of Present Illness:      73 yo female with Hypertension, Type 2 Diabetes with peripheral neuropathy and atherosclerotic cerebrovascular disease status post a left embolic PCA infarct in October of 2019 with residual visual field defect. Patient is in a clinical trial with Neurology and is on a blood thinner but she is not sure which one due to the study. Patient reports that overall she feels well. She has been staying inside of her home due to the COVID-19 pandemic and her daughter brings her what she needs. Patient monitors her blood sugars and blood pressure daily. Daughter states that patient has been having blood sugars that range from a low of 75-90 over the past week and that even after a meal, patient's blood sugar will be in the low 100's within 2 hours after a meal. Patient does report that she felt that her vision was a little blurry and she did not feel quite right one morning when her blood sugar was low so she ate something and felt better. She has had no syncopal episodes or falls. Blood pressure is mostly in the 130-140's over/60-70's but she has had a BP of 166/73 this weekend but was normal when she checked it later. She denies any headaches or dizziness related to BP. No new stroke symptoms, no focal numbness or weakness, no slurred speech and no new onset of persistent visual changes. She does need some medication refills. Gabapentin has helped with the numbness and tingling in her  feet. She has had no increase in muscle or joint pain with the use of atorvastatin. No unusual bruising or bleeding related to her use of blood thinning medication.   Past Medical History:  Diagnosis Date  . Diabetes ([place])   . Hypertension   . Stroke (cerebrum) [facility])    Past Surgical History:  Procedure Laterality Date  . hysterectomy    . LOOP RECORDER INSERTION N/A 05/20/2018   Procedure: LOOP RECORDER INSERTION;  Surgeon: Thompson Grayer, MD;  Location: [facility];  Service: Cardiovascular;  Laterality: N/A;  . TEE WITHOUT CARDIOVERSION N/A 05/20/2018   Procedure: TRANSESOPHAGEAL ECHOCARDIOGRAM (TEE);  Surgeon: Sueanne Margarita, MD;  Location: [facility] ENDOSCOPY;  Service: Cardiovascular;  Laterality: N/A;   Family History  Problem Relation Age of Onset  . Hypertension Mother   . Stroke Mother   . Hypertension Father    Social History   Tobacco Use  . Smoking status: Former Research scientist (life sciences)  . Smokeless tobacco: Never Used  Substance Use Topics  . Alcohol use: Never    Frequency: Never  . Drug use: Never   Allergies  Allergen Reactions  . Sulfa Antibiotics     Itching    Review of Systems  Constitutional: Negative for chills and fever.  HENT: Negative for congestion and sore throat.   Eyes: Positive for blurred vision (one episode when blood sugar was low). Negative for double vision.  Respiratory: Negative for cough and shortness of breath.   Cardiovascular: Negative for chest pain and palpitations.  Gastrointestinal: Negative for abdominal pain, constipation, diarrhea, nausea and vomiting.  Genitourinary: Negative for dysuria, frequency and urgency.  Musculoskeletal: Negative for falls, joint pain and myalgias.  Skin: Negative for itching and rash.  Neurological: Negative for dizziness, focal weakness and headaches.  Endo/Heme/Allergies: Negative for polydipsia. Does not bruise/bleed easily.      Observations/Objective: No vital signs or physical exam obtained as visit  was done by video (webex encounter)  Assessment and Plan: 1. Type 2 diabetes mellitus with diabetic mononeuropathy, without long-term current use of insulin ([place]) Patient reports episodes of hypoglycemia and that her blood sugar is remaining low. She is currently on Janumet XR 100/10000 and this will be discontinued. I discussed with patient and daughter that a fasting blood sugar of 120-130 would be preferred to help reduce the risk of complications such as falls or confusion due to hypoglycemia especially as patient currently lives alone but her daughter checks on her daily. Will send in RX for Janumet 50/500 once per day. Continue a healthy diet and monitoring of blood sugars and call with any concerns. Gabapentin refilled for continued treatment of neuropathy. - sitaGLIPtin-metformin (JANUMET) 50-500 MG tablet; Once daily with biggest meal of the day  Dispense: 30 tablet; Refill: 6 - gabapentin (NEURONTIN) 100 MG capsule; Take 1 capsule (100 mg total) by mouth 3 (three) times daily.  Dispense: 90 capsule; Refill: 11  2. Essential hypertension Blood pressures have been for the most part near the goal of 130/80 and refills provided for carvedilol and valsartan. Continue low sodium diet and monitoring of blood pressures. Notify provider if blood pressures are too low or too high for 2-3 days or with any concerns - carvedilol (COREG) 25 MG tablet; Take 1 tablet (25 mg total) by mouth 2 (two) times daily with a meal.  Dispense: 60 tablet; Refill: 11 - valsartan (DIOVAN) 320 MG tablet; Take 1 tablet (320 mg total) by mouth daily. To lower blood pressure  Dispense: 30 tablet; Refill: 11  3. Atherosclerotic cerebrovascular disease Continue high dose atorvastatin for hyperlipidemia with atherosclerotic cerebrovascular disease - atorvastatin (LIPITOR) 80 MG tablet; Take 1 tablet (80 mg total) by mouth daily at 6 PM. To lower cholesterol  Dispense: 90 tablet; Refill: 4  4. Status post CVA Patient is also  being followed by Neurology and daughter/patient encouraged to contact neurology as patient's last [place] study visit was cancelled due to COVID-19 and patient has not been contacted for follow-up. Continue to control blood pressure, blood sugar and lipids  5. Encounter for long-term (current) use of medications Patient will be scheduled for labs in follow-up of long term use of high risk medications as well as in follow-up of chronic medical issues when it is safe for her to do so. Future lab orders will be placed  6. Long term use of antithrombotic/antiplatelet medication Will add CBC to upcoming labs. Patient is not sure which medication she is on at this time due to her participation in a clinical trial through her Neurology office  Follow Up Instructions:Return in about 3 months (around 02/20/2019) for DM-labs in 6 weeks.    I discussed the assessment and treatment plan with the patient. The patient was provided an opportunity to ask questions and all were answered. The patient agreed with the plan and demonstrated an understanding of the instructions.   The patient was advised to call back or seek an in-person evaluation if the symptoms worsen or if the condition fails to improve as anticipated.  I provided  16 minutes of non-face-to-face time during this encounter.   Antony Blackbird, MD

## 2018-11-22 DIAGNOSIS — I69351 Hemiplegia and hemiparesis following cerebral infarction affecting right dominant side: Secondary | ICD-10-CM | POA: Insufficient documentation

## 2018-12-04 ENCOUNTER — Ambulatory Visit (INDEPENDENT_AMBULATORY_CARE_PROVIDER_SITE_OTHER): Payer: Private Health Insurance - Indemnity | Admitting: *Deleted

## 2018-12-04 ENCOUNTER — Other Ambulatory Visit: Payer: Self-pay

## 2018-12-04 DIAGNOSIS — I639 Cerebral infarction, unspecified: Secondary | ICD-10-CM | POA: Diagnosis not present

## 2018-12-05 LAB — CUP PACEART REMOTE DEVICE CHECK
Date Time Interrogation Session: 20200507174000
Implantable Pulse Generator Implant Date: 20191022

## 2018-12-09 NOTE — Progress Notes (Signed)
[facility] Summary Report / Loop Recorder 

## 2019-01-06 ENCOUNTER — Ambulatory Visit (INDEPENDENT_AMBULATORY_CARE_PROVIDER_SITE_OTHER): Payer: Medicare HMO | Admitting: *Deleted

## 2019-01-06 DIAGNOSIS — I639 Cerebral infarction, unspecified: Secondary | ICD-10-CM | POA: Diagnosis not present

## 2019-01-06 LAB — CUP PACEART REMOTE DEVICE CHECK
Date Time Interrogation Session: 20200609165843
Implantable Pulse Generator Implant Date: 20191022

## 2019-01-15 NOTE — Progress Notes (Signed)
[facility] Summary Report / Loop Recorder 

## 2019-02-09 ENCOUNTER — Ambulatory Visit (INDEPENDENT_AMBULATORY_CARE_PROVIDER_SITE_OTHER): Payer: Private Health Insurance - Indemnity | Admitting: *Deleted

## 2019-02-09 DIAGNOSIS — I6300 Cerebral infarction due to thrombosis of unspecified precerebral artery: Secondary | ICD-10-CM

## 2019-02-09 LAB — CUP PACEART REMOTE DEVICE CHECK
Date Time Interrogation Session: 20200712181010
Implantable Pulse Generator Implant Date: 20191022

## 2019-02-23 NOTE — Progress Notes (Signed)
[facility] Summary Report / Loop Recorder 

## 2019-03-13 ENCOUNTER — Ambulatory Visit (INDEPENDENT_AMBULATORY_CARE_PROVIDER_SITE_OTHER): Payer: Medicare HMO | Admitting: *Deleted

## 2019-03-13 DIAGNOSIS — I6300 Cerebral infarction due to thrombosis of unspecified precerebral artery: Secondary | ICD-10-CM | POA: Diagnosis not present

## 2019-03-13 LAB — CUP PACEART REMOTE DEVICE CHECK
Date Time Interrogation Session: 20200814173205
Implantable Pulse Generator Implant Date: 20191022

## 2019-03-23 NOTE — Progress Notes (Signed)
[facility] Summary Report / Loop Recorder 

## 2019-04-15 ENCOUNTER — Ambulatory Visit (INDEPENDENT_AMBULATORY_CARE_PROVIDER_SITE_OTHER): Payer: Private Health Insurance - Indemnity | Admitting: *Deleted

## 2019-04-15 DIAGNOSIS — I6300 Cerebral infarction due to thrombosis of unspecified precerebral artery: Secondary | ICD-10-CM

## 2019-04-16 LAB — CUP PACEART REMOTE DEVICE CHECK
Date Time Interrogation Session: 20200916183825
Implantable Pulse Generator Implant Date: 20191022

## 2019-04-21 NOTE — Progress Notes (Signed)
[facility] Summary Report / Loop Recorder 

## 2019-05-18 ENCOUNTER — Ambulatory Visit (INDEPENDENT_AMBULATORY_CARE_PROVIDER_SITE_OTHER): Payer: Private Health Insurance - Indemnity | Admitting: *Deleted

## 2019-05-18 DIAGNOSIS — I6300 Cerebral infarction due to thrombosis of unspecified precerebral artery: Secondary | ICD-10-CM

## 2019-05-19 LAB — CUP PACEART REMOTE DEVICE CHECK
Date Time Interrogation Session: 20201019184525
Implantable Pulse Generator Implant Date: 20191022

## 2019-05-25 ENCOUNTER — Other Ambulatory Visit: Payer: Self-pay | Admitting: Family Medicine

## 2019-05-25 DIAGNOSIS — E1141 Type 2 diabetes mellitus with diabetic mononeuropathy: Secondary | ICD-10-CM

## 2019-05-27 ENCOUNTER — Ambulatory Visit: Payer: Private Health Insurance - Indemnity | Attending: Family Medicine | Admitting: Family Medicine

## 2019-05-27 ENCOUNTER — Encounter: Payer: Self-pay | Admitting: Family Medicine

## 2019-05-27 ENCOUNTER — Other Ambulatory Visit: Payer: Self-pay

## 2019-05-27 VITALS — BP 163/75 | HR 64 | Temp 98.2°F | Resp 18 | Ht 62.0 in | Wt 156.0 lb

## 2019-05-27 DIAGNOSIS — E1141 Type 2 diabetes mellitus with diabetic mononeuropathy: Secondary | ICD-10-CM | POA: Diagnosis not present

## 2019-05-27 DIAGNOSIS — Z23 Encounter for immunization: Secondary | ICD-10-CM | POA: Diagnosis not present

## 2019-05-27 DIAGNOSIS — Z1239 Encounter for other screening for malignant neoplasm of breast: Secondary | ICD-10-CM

## 2019-05-27 DIAGNOSIS — I10 Essential (primary) hypertension: Secondary | ICD-10-CM

## 2019-05-27 DIAGNOSIS — I672 Cerebral atherosclerosis: Secondary | ICD-10-CM

## 2019-05-27 DIAGNOSIS — R7303 Prediabetes: Secondary | ICD-10-CM

## 2019-05-27 DIAGNOSIS — Z7902 Long term (current) use of antithrombotics/antiplatelets: Secondary | ICD-10-CM

## 2019-05-27 DIAGNOSIS — I69351 Hemiplegia and hemiparesis following cerebral infarction affecting right dominant side: Secondary | ICD-10-CM

## 2019-05-27 DIAGNOSIS — Z8673 Personal history of transient ischemic attack (TIA), and cerebral infarction without residual deficits: Secondary | ICD-10-CM | POA: Diagnosis not present

## 2019-05-27 DIAGNOSIS — Z79899 Other long term (current) drug therapy: Secondary | ICD-10-CM

## 2019-05-27 DIAGNOSIS — Z1211 Encounter for screening for malignant neoplasm of colon: Secondary | ICD-10-CM

## 2019-05-27 LAB — POCT GLYCOSYLATED HEMOGLOBIN (HGB A1C): Hemoglobin A1C: 6.1 % — AB (ref 4.0–5.6)

## 2019-05-27 MED ORDER — GABAPENTIN 100 MG PO CAPS: 100.0000 mg | ORAL_CAPSULE | Freq: Three times a day (TID) | ORAL | 3 refills | Status: DC

## 2019-05-27 MED ORDER — ATORVASTATIN CALCIUM 80 MG PO TABS: ORAL_TABLET | ORAL | 4 refills | Status: DC

## 2019-05-27 MED ORDER — JANUMET 50-500 MG PO TABS: ORAL_TABLET | ORAL | 3 refills | Status: DC

## 2019-05-27 MED ORDER — AMLODIPINE BESYLATE 10 MG PO TABS: 10.0000 mg | ORAL_TABLET | Freq: Every day | ORAL | 3 refills | Status: DC

## 2019-05-27 MED ORDER — VALSARTAN 320 MG PO TABS: 320.0000 mg | ORAL_TABLET | Freq: Every day | ORAL | 3 refills | Status: DC

## 2019-05-27 MED ORDER — HYDROCHLOROTHIAZIDE 12.5 MG PO TABS: 12.5000 mg | ORAL_TABLET | Freq: Every day | ORAL | 3 refills | Status: DC

## 2019-05-27 MED ORDER — CARVEDILOL 25 MG PO TABS: 25.0000 mg | ORAL_TABLET | Freq: Two times a day (BID) | ORAL | 11 refills | Status: DC

## 2019-05-27 NOTE — Progress Notes (Signed)
Patient needs a refill on Janumet today Patients BP at home before medication at 7 am today was 152/68. Patient does have BP logs with her.

## 2019-05-27 NOTE — Patient Instructions (Signed)
Hypertension, Adult Hypertension is another name for high blood pressure. High blood pressure forces your heart to work harder to pump blood. This can cause problems over time. There are two numbers in a blood pressure reading. There is a top number (systolic) over a bottom number (diastolic). It is best to have a blood pressure that is below 120/80. Healthy choices can help lower your blood pressure, or you may need medicine to help lower it. What are the causes? The cause of this condition is not known. Some conditions may be related to high blood pressure. What increases the risk?  Smoking.  Having type 2 diabetes mellitus, high cholesterol, or both.  Not getting enough exercise or physical activity.  Being overweight.  Having too much fat, sugar, calories, or salt (sodium) in your diet.  Drinking too much alcohol.  Having long-term (chronic) kidney disease.  Having a family history of high blood pressure.  Age. Risk increases with age.  Race. You may be at higher risk if you are African American.  Gender. Men are at higher risk than women before age 45. After age 65, women are at higher risk than men.  Having obstructive sleep apnea.  Stress. What are the signs or symptoms?  High blood pressure may not cause symptoms. Very high blood pressure (hypertensive crisis) may cause: ? Headache. ? Feelings of worry or nervousness (anxiety). ? Shortness of breath. ? Nosebleed. ? A feeling of being sick to your stomach (nausea). ? Throwing up (vomiting). ? Changes in how you see. ? Very bad chest pain. ? Seizures. How is this treated?  This condition is treated by making healthy lifestyle changes, such as: ? Eating healthy foods. ? Exercising more. ? Drinking less alcohol.  Your health care provider may prescribe medicine if lifestyle changes are not enough to get your blood pressure under control, and if: ? Your top number is above 130. ? Your bottom number is above  80.  Your personal target blood pressure may vary. Follow these instructions at home: Eating and drinking   If told, follow the DASH eating plan. To follow this plan: ? Fill one half of your plate at each meal with fruits and vegetables. ? Fill one fourth of your plate at each meal with whole grains. Whole grains include whole-wheat pasta, brown rice, and whole-grain bread. ? Eat or drink low-fat dairy products, such as skim milk or low-fat yogurt. ? Fill one fourth of your plate at each meal with low-fat (lean) proteins. Low-fat proteins include fish, chicken without skin, eggs, beans, and tofu. ? Avoid fatty meat, cured and processed meat, or chicken with skin. ? Avoid pre-made or processed food.  Eat less than 1,500 mg of salt each day.  Do not drink alcohol if: ? Your doctor tells you not to drink. ? You are pregnant, may be pregnant, or are planning to become pregnant.  If you drink alcohol: ? Limit how much you use to:  0-1 drink a day for women.  0-2 drinks a day for men. ? Be aware of how much alcohol is in your drink. In the [place] one drink equals one 12 oz bottle of beer (355 mL), one 5 oz glass of wine (148 mL), or one 1 oz glass of hard liquor (44 mL). Lifestyle   Work with your doctor to stay at a healthy weight or to lose weight. Ask your doctor what the best weight is for you.  Get at least 30 minutes of exercise most   days of the week. This may include walking, swimming, or biking.  Get at least 30 minutes of exercise that strengthens your muscles (resistance exercise) at least 3 days a week. This may include lifting weights or doing Pilates.  Do not use any products that contain nicotine or tobacco, such as cigarettes, e-cigarettes, and chewing tobacco. If you need help quitting, ask your doctor.  Check your blood pressure at home as told by your doctor.  Keep all follow-up visits as told by your doctor. This is important. Medicines  Take over-the-counter  and prescription medicines only as told by your doctor. Follow directions carefully.  Do not skip doses of blood pressure medicine. The medicine does not work as well if you skip doses. Skipping doses also puts you at risk for problems.  Ask your doctor about side effects or reactions to medicines that you should watch for. Contact a doctor if you:  Think you are having a reaction to the medicine you are taking.  Have headaches that keep coming back (recurring).  Feel dizzy.  Have swelling in your ankles.  Have trouble with your vision. Get help right away if you:  Get a very bad headache.  Start to feel mixed up (confused).  Feel weak or numb.  Feel faint.  Have very bad pain in your: ? Chest. ? Belly (abdomen).  Throw up more than once.  Have trouble breathing. Summary  Hypertension is another name for high blood pressure.  High blood pressure forces your heart to work harder to pump blood.  For most people, a normal blood pressure is less than 120/80.  Making healthy choices can help lower blood pressure. If your blood pressure does not get lower with healthy choices, you may need to take medicine. This information is not intended to replace advice given to you by your health care provider. Make sure you discuss any questions you have with your health care provider. Document Released: 01/02/2008 Document Revised: 03/26/2018 Document Reviewed: 03/26/2018 Elsevier Patient Education  2020 [place] Your Hypertension Hypertension is commonly called high blood pressure. This is when the force of your blood pressing against the walls of your arteries is too strong. Arteries are blood vessels that carry blood from your heart throughout your body. Hypertension forces the heart to work harder to pump blood, and may cause the arteries to become narrow or stiff. Having untreated or uncontrolled hypertension can cause heart attack, stroke, kidney disease, and  other problems. What are blood pressure readings? A blood pressure reading consists of a higher number over a lower number. Ideally, your blood pressure should be below 120/80. The first ("top") number is called the systolic pressure. It is a measure of the pressure in your arteries as your heart beats. The second ("bottom") number is called the diastolic pressure. It is a measure of the pressure in your arteries as the heart relaxes. What does my blood pressure reading mean? Blood pressure is classified into four stages. Based on your blood pressure reading, your health care provider may use the following stages to determine what type of treatment you need, if any. Systolic pressure and diastolic pressure are measured in a unit called mm Hg. Normal  Systolic pressure: below 123456.  Diastolic pressure: below 80. Elevated  Systolic pressure: [id number].  Diastolic pressure: below 80. Hypertension stage 1  Systolic pressure: 0000000.  Diastolic pressure: [id number]. Hypertension stage 2  Systolic pressure: [id number] or above.  Diastolic pressure: 90 or above. What health risks  are associated with hypertension? Managing your hypertension is an important responsibility. Uncontrolled hypertension can lead to:  A heart attack.  A stroke.  A weakened blood vessel (aneurysm).  Heart failure.  Kidney damage.  Eye damage.  Metabolic syndrome.  Memory and concentration problems. What changes can I make to manage my hypertension? Hypertension can be managed by making lifestyle changes and possibly by taking medicines. Your health care provider will help you make a plan to bring your blood pressure within a normal range. Eating and drinking   Eat a diet that is high in fiber and potassium, and low in salt (sodium), added sugar, and fat. An example eating plan is called the DASH (Dietary Approaches to Stop Hypertension) diet. To eat this way: ? Eat plenty of fresh fruits and vegetables. Try to  fill half of your plate at each meal with fruits and vegetables. ? Eat whole grains, such as whole wheat pasta, brown rice, or whole grain bread. Fill about one quarter of your plate with whole grains. ? Eat low-fat diary products. ? Avoid fatty cuts of meat, processed or cured meats, and poultry with skin. Fill about one quarter of your plate with lean proteins such as fish, chicken without skin, beans, eggs, and tofu. ? Avoid premade and processed foods. These tend to be higher in sodium, added sugar, and fat.  Reduce your daily sodium intake. Most people with hypertension should eat less than 1,500 mg of sodium a day.  Limit alcohol intake to no more than 1 drink a day for nonpregnant women and 2 drinks a day for men. One drink equals 12 oz of beer, 5 oz of wine, or 1 oz of hard liquor. Lifestyle  Work with your health care provider to maintain a healthy body weight, or to lose weight. Ask what an ideal weight is for you.  Get at least 30 minutes of exercise that causes your heart to beat faster (aerobic exercise) most days of the week. Activities may include walking, swimming, or biking.  Include exercise to strengthen your muscles (resistance exercise), such as weight lifting, as part of your weekly exercise routine. Try to do these types of exercises for 30 minutes at least 3 days a week.  Do not use any products that contain nicotine or tobacco, such as cigarettes and e-cigarettes. If you need help quitting, ask your health care provider.  Control any long-term (chronic) conditions you have, such as high cholesterol or diabetes. Monitoring  Monitor your blood pressure at home as told by your health care provider. Your personal target blood pressure may vary depending on your medical conditions, your age, and other factors.  Have your blood pressure checked regularly, as often as told by your health care provider. Working with your health care provider  Review all the medicines you  take with your health care provider because there may be side effects or interactions.  Talk with your health care provider about your diet, exercise habits, and other lifestyle factors that may be contributing to hypertension.  Visit your health care provider regularly. Your health care provider can help you create and adjust your plan for managing hypertension. Will I need medicine to control my blood pressure? Your health care provider may prescribe medicine if lifestyle changes are not enough to get your blood pressure under control, and if:  Your systolic blood pressure is 130 or higher.  Your diastolic blood pressure is 80 or higher. Take medicines only as told by your health care  provider. Follow the directions carefully. Blood pressure medicines must be taken as prescribed. The medicine does not work as well when you skip doses. Skipping doses also puts you at risk for problems. Contact a health care provider if:  You think you are having a reaction to medicines you have taken.  You have repeated (recurrent) headaches.  You feel dizzy.  You have swelling in your ankles.  You have trouble with your vision. Get help right away if:  You develop a severe headache or confusion.  You have unusual weakness or numbness, or you feel faint.  You have severe pain in your chest or abdomen.  You vomit repeatedly.  You have trouble breathing. Summary  Hypertension is when the force of blood pumping through your arteries is too strong. If this condition is not controlled, it may put you at risk for serious complications.  Your personal target blood pressure may vary depending on your medical conditions, your age, and other factors. For most people, a normal blood pressure is less than 120/80.  Hypertension is managed by lifestyle changes, medicines, or both. Lifestyle changes include weight loss, eating a healthy, low-sodium diet, exercising more, and limiting alcohol. This  information is not intended to replace advice given to you by your health care provider. Make sure you discuss any questions you have with your health care provider. Document Released: 04/09/2012 Document Revised: 11/07/2018 Document Reviewed: 06/13/2016 Elsevier Patient Education  2020 [employer].

## 2019-05-27 NOTE — Progress Notes (Signed)
Established Patient Office Visit  Subjective:  Patient ID: Heather Rogers, female    DOB: 22-Apr-1946  Age: 73 y.o. MRN: [id number]  CC:  Chief Complaint  Patient presents with  . Medication Refill    HPI Heather Rogers, 73 year old female who is seen in follow-up of chronic medical issues including type 2 diabetes, hypertension, hyperlipidemia and cerebral atherosclerotic disease with history of stroke with mild residual right-sided weakness as well as visual field deficit.  She reports that she has been checking her home blood sugars and home blood pressures.  She reports that prior to taking her blood pressure medicine this morning around 7 AM, her home blood pressure was 152/68.  She denies any current headaches or dizziness related to her blood pressure.  She has had no further strokelike symptoms.  When she checks her blood pressure at home and is generally much better than here in the office.  Home blood pressures are generally around 140/90 or less.  Blood sugars remain well controlled and she denies any significant hypoglycemic episodes.  Fasting blood sugars are between 90-120.  She continues to take her cholesterol medication and denies any increased muscle or joint aches.  She does need refill of her atorvastatin at today's visit.  She continues to take her blood thinning medication but as she is participating in a study through her neurology office, she is not sure which medication she is taking.  Past Medical History:  Diagnosis Date  . Diabetes ([place])   . Hypertension   . Stroke (cerebrum) [facility])     Past Surgical History:  Procedure Laterality Date  . hysterectomy    . LOOP RECORDER INSERTION N/A 05/20/2018   Procedure: LOOP RECORDER INSERTION;  Surgeon: Thompson Grayer, MD;  Location: [facility] [facility];  Service: Cardiovascular;  Laterality: N/A;  . TEE WITHOUT CARDIOVERSION N/A 05/20/2018   Procedure: TRANSESOPHAGEAL ECHOCARDIOGRAM (TEE);  Surgeon: Sueanne Margarita,  MD;  Location: [facility] ENDOSCOPY;  Service: Cardiovascular;  Laterality: N/A;    Family History  Problem Relation Age of Onset  . Hypertension Mother   . Stroke Mother   . Hypertension Father     Social History   Socioeconomic History  . Marital status: Divorced    Spouse name: Not on file  . Number of children: Not on file  . Years of education: Not on file  . Highest education level: Not on file  Occupational History  . Not on file  Social Needs  . Financial resource strain: Not on file  . Food insecurity    Worry: Not on file    Inability: Not on file  . Transportation needs    Medical: Not on file    Non-medical: Not on file  Tobacco Use  . Smoking status: Former Research scientist (life sciences)  . Smokeless tobacco: Never Used  Substance and Sexual Activity  . Alcohol use: Never    Frequency: Never  . Drug use: Never  . Sexual activity: Not Currently  Lifestyle  . Physical activity    Days per week: Not on file    Minutes per session: Not on file  . Stress: Not on file  Relationships  . Social Herbalist on phone: Not on file    Gets together: Not on file    Attends religious service: Not on file    Active member of club or organization: Not on file    Attends meetings of clubs or organizations: Not on file  Relationship status: Not on file  . Intimate partner violence    Fear of current or ex partner: Not on file    Emotionally abused: Not on file    Physically abused: Not on file    Forced sexual activity: Not on file  Other Topics Concern  . Not on file  Social History Narrative   Lives in [place].    Outpatient Medications Prior to Visit  Medication Sig Dispense Refill  . ACCU-CHEK FASTCLIX LANCETS MISC Check blood sugar fasting and before meals and again if pt feels bad (symptoms of hypo). 100 each 12  . amLODipine (NORVASC) 10 MG tablet Take 1 tablet (10 mg total) by mouth daily. 90 tablet 2  . atorvastatin (LIPITOR) 80 MG tablet Take 1 tablet (80 mg total)  by mouth daily at 6 PM. To lower cholesterol 90 tablet 4  . Blood Glucose Monitoring Suppl (ACCU-CHEK GUIDE) w/Device KIT 1 each by Does not apply route as directed. 1 kit 0  . carvedilol (COREG) 25 MG tablet Take 1 tablet (25 mg total) by mouth 2 (two) times daily with a meal. 60 tablet 11  . gabapentin (NEURONTIN) 100 MG capsule Take 1 capsule (100 mg total) by mouth 3 (three) times daily. 90 capsule 11  . glucose blood (ACCU-CHEK GUIDE) test strip Check blood sugar fasting and before meals and again if pt feels bad (symptoms of hypo). 100 each 12  . sitaGLIPtin-metformin (JANUMET) 50-500 MG tablet Once daily with biggest meal of the day 30 tablet 6  . Study - [place] - apixaban 2.46m or Placebo Take 2.5 mg by mouth 2 (two) times daily.    . Study - [place] - aspirin 869mor Placebo Take 81 mg by mouth daily.    . valsartan (DIOVAN) 320 MG tablet Take 1 tablet (320 mg total) by mouth daily. To lower blood pressure 30 tablet 11  . allopurinol (ZYLOPRIM) 100 MG tablet Take 1 tablet (100 mg total) by mouth daily. 30 tablet 0   No facility-administered medications prior to visit.     Allergies  Allergen Reactions  . Sulfa Antibiotics     Itching     ROS Review of Systems  Constitutional: Positive for fatigue (occasional mild). Negative for chills and fever.  HENT: Negative for sore throat and trouble swallowing.   Eyes: Positive for visual disturbance (since CVA). Negative for photophobia.  Respiratory: Negative for cough and shortness of breath.   Cardiovascular: Negative for chest pain, palpitations and leg swelling.  Gastrointestinal: Negative for abdominal pain, blood in stool, constipation, diarrhea and nausea.  Endocrine: Negative for polydipsia, polyphagia and polyuria.  Genitourinary: Negative for dysuria and frequency.  Musculoskeletal: Negative for arthralgias and back pain.  Neurological: Positive for weakness (mild). Negative for dizziness and headaches.  Hematological:  Negative for adenopathy. Does not bruise/bleed easily.      Objective:    Physical Exam  Constitutional: She is oriented to person, place, and time. She appears well-developed and well-nourished.  Neck: No JVD present.  Cardiovascular: Normal rate and regular rhythm.  No carotid bruit  Pulmonary/Chest: Effort normal and breath sounds normal.  Abdominal: Soft. There is no abdominal tenderness. There is no rebound and no guarding.  Musculoskeletal:        General: No tenderness or edema.     Cervical back: Normal range of motion and neck supple.     Comments: No CVA tenderness.  Lymphadenopathy:    She has no cervical adenopathy.  Neurological: She is  alert and oriented to person, place, and time.  Strength is approximately 5/5 in all extremities  Skin: Skin is warm and dry.  No active skin breakdown, ulcers or calluses on the feet  Psychiatric: She has a normal mood and affect. Her behavior is normal.  Nursing note and vitals reviewed.   BP (!) 175/73 (BP Location: Left Arm, Patient Position: Sitting, Cuff Size: Normal)   Pulse 64   Temp 98.2 F (36.8 C) (Oral)   Resp 18   Ht 5' 2" (1.575 m)   Wt 156 lb (70.8 kg)   SpO2 100%   BMI 28.53 kg/m  Wt Readings from Last 3 Encounters:  05/27/19 156 lb (70.8 kg)  09/25/18 148 lb 12.8 oz (67.5 kg)  08/20/18 151 lb 3.2 oz (68.6 kg)     Health Maintenance Due  Topic Date Due  . Hepatitis C Screening  [birth year]  . MAMMOGRAM  10/14/1995  . COLONOSCOPY  10/14/1995  . DEXA SCAN  10/14/2010  . PNA vac Low Risk Adult (2 of 2 - PPSV23) 05/17/2019    No results found for: TSH Lab Results  Component Value Date   WBC 6.6 05/15/2018   HGB 12.9 05/15/2018   HCT 38.0 05/15/2018   MCV 73.9 (L) 05/15/2018   PLT 319 05/15/2018   Lab Results  Component Value Date   NA 141 07/08/2018   K 4.1 07/08/2018   CO2 24 07/08/2018   GLUCOSE 107 (H) 07/08/2018   BUN 12 07/08/2018   CREATININE 0.99 07/08/2018   BILITOT 0.3 07/08/2018    ALKPHOS 81 07/08/2018   AST 11 07/08/2018   ALT 12 07/08/2018   PROT 7.3 07/08/2018   ALBUMIN 4.3 07/08/2018   CALCIUM 10.5 (H) 07/08/2018   ANIONGAP 11 05/16/2018   Lab Results  Component Value Date   CHOL 126 05/16/2018   Lab Results  Component Value Date   HDL 27 (L) 05/16/2018   Lab Results  Component Value Date   LDLCALC 64 05/16/2018   Lab Results  Component Value Date   TRIG 175 (H) 05/16/2018   Lab Results  Component Value Date   CHOLHDL 4.7 05/16/2018   Lab Results  Component Value Date   HGBA1C 6.1 (A) 05/27/2019      Assessment & Plan:  8.  Screening for colon cancer She agrees to do fecal occult blood testing as a screening test for colon cancer.  She is not interested in having colonoscopy done at this time.  She was provided with kit for collection of sample for fecal occult blood testing along with instructions for completion of test. - Fecal occult blood, imunochemical([facility])  9. Breast cancer screening Order placed for screening mammogram  1. Type 2 diabetes mellitus with diabetic mononeuropathy, without long-term current use of insulin (HCC) Her blood sugars have traditionally been well controlled.  Will obtain hemoglobin A1c at today's visit along with comprehensive metabolic panel in follow-up of diabetes and medication use.  Refills provided of patient's Janumet and gabapentin.  She is encouraged to continue low carbohydrate diet and continue monitoring of blood sugars. - HgB A1c - gabapentin (NEURONTIN) 100 MG capsule; Take 1 capsule (100 mg total) by mouth 3 (three) times daily.  Dispense: 270 capsule; Refill: 3 - sitaGLIPtin-metformin (JANUMET) 50-500 MG tablet; Once daily with biggest meal of the day  Dispense: 90 tablet; Refill: 3 - Comprehensive metabolic panel  2. Essential hypertension She will have recheck of blood pressure at end of visit.  Again discussed  goal blood pressure of less than 140/90 and discussed importance of  maintaining control of blood pressure, compliance with cholesterol medication and antithrombotic therapy as secondary stroke prevention measures.  Continue monitoring home blood pressure and return/notify office if blood pressures continue to remain elevated.  Educational information on hypertension provided as part of patient's after visit summary.  Blood pressure medications refilled and she is to follow-up in the next 2 to 3 weeks for blood pressure recheck. - amLODipine (NORVASC) 10 MG tablet; Take 1 tablet (10 mg total) by mouth daily. To lower blood pressure  Dispense: 90 tablet; Refill: 3 - carvedilol (COREG) 25 MG tablet; Take 1 tablet (25 mg total) by mouth 2 (two) times daily with a meal.  Dispense: 60 tablet; Refill: 11 - valsartan (DIOVAN) 320 MG tablet; Take 1 tablet (320 mg total) by mouth daily. To lower blood pressure  Dispense: 90 tablet; Refill: 3 - hydrochlorothiazide (HYDRODIURIL) 12.5 MG tablet; Take 1 tablet (12.5 mg total) by mouth daily.  Dispense: 90 tablet; Refill: 3  3. Atherosclerotic cerebrovascular disease; 4. S/p CVA; 5.  Spastic hemiplegia of right dominant side as late effect of cerebral infarction She has history of embolic CVA due to atherosclerotic cerebrovascular disease/hypertension with residual spastic hemiplegia and visual field deficit.  MRI during hospitalization showed occlusion of the left PCA with acute to subacute patchy left PCA infarct.  Patient was last seen by neurology in February and will be scheduled for follow-up.  She will also have refill of atorvastatin and she is encouraged to continue to remain compliant with statin medication to lower her cholesterol as well as low-fat diet.  She will have CBC in follow-up of use of antithrombotic medication.  She is currently enrolled in a study and does not know if she is on aspirin or low-dose Eliquis.  She additionally will have comprehensive metabolic panel in follow-up of long-term use of statin therapy. -  Ambulatory referral to Neurology - atorvastatin (LIPITOR) 80 MG tablet; One pill by mouth in the evening to lower cholesterol  Dispense: 90 tablet; Refill: 4 - CBC - Comprehensive metabolic panel  6. Encounter for long-term (current) use of medications Comprehensive metabolic panel will be done at today's visit and follow-up of patient's long-term use of multiple medications for the treatment of diabetes, hypertension and hyperlipidemia. - Comprehensive metabolic panel  7. Encounter for current long term use of antithrombotic drug She is currently participating in a research study and is not sure if she is currently on low-dose aspirin or low-dose Eliquis as an anticoagulant.  Patient will have CBC done in follow-up of antithrombotic use. - CBC   An After Visit Summary was printed and given to the patient.   Follow-up: Return in about 3 weeks (around 06/17/2019) for HTN follow-up.   Antony Blackbird, MD

## 2019-05-28 LAB — COMPREHENSIVE METABOLIC PANEL WITH GFR
ALT: 15 IU/L (ref 0–32)
AST: 13 IU/L (ref 0–40)
Albumin/Globulin Ratio: 1.6 (ref 1.2–2.2)
Albumin: 4.5 g/dL (ref 3.7–4.7)
Alkaline Phosphatase: 98 IU/L (ref 39–117)
BUN/Creatinine Ratio: 22 (ref 12–28)
BUN: 19 mg/dL (ref 8–27)
Bilirubin Total: 0.3 mg/dL (ref 0.0–1.2)
CO2: 25 mmol/L (ref 20–29)
Calcium: 10 mg/dL (ref 8.7–10.3)
Chloride: 105 mmol/L (ref 96–106)
Creatinine, Ser: 0.85 mg/dL (ref 0.57–1.00)
GFR calc Af Amer: 79 mL/min/1.73
GFR calc non Af Amer: 68 mL/min/1.73
Globulin, Total: 2.9 g/dL (ref 1.5–4.5)
Glucose: 93 mg/dL (ref 65–99)
Potassium: 4.5 mmol/L (ref 3.5–5.2)
Sodium: 141 mmol/L (ref 134–144)
Total Protein: 7.4 g/dL (ref 6.0–8.5)

## 2019-05-28 LAB — CBC
Hematocrit: 35.8 % (ref 34.0–46.6)
Hemoglobin: 11.6 g/dL (ref 11.1–15.9)
MCH: 22.9 pg — ABNORMAL LOW (ref 26.6–33.0)
MCHC: 32.4 g/dL (ref 31.5–35.7)
MCV: 71 fL — ABNORMAL LOW (ref 79–97)
Platelets: 288 x10E3/uL (ref 150–450)
RBC: 5.07 x10E6/uL (ref 3.77–5.28)
RDW: 14.7 % (ref 11.7–15.4)
WBC: 5.4 x10E3/uL (ref 3.4–10.8)

## 2019-06-05 LAB — FECAL OCCULT BLOOD, IMMUNOCHEMICAL: Fecal Occult Bld: NEGATIVE

## 2019-06-08 NOTE — Progress Notes (Signed)
[facility] Summary Report / Loop Recorder 

## 2019-06-18 ENCOUNTER — Encounter: Payer: Self-pay | Admitting: Family Medicine

## 2019-06-18 ENCOUNTER — Ambulatory Visit: Payer: Private Health Insurance - Indemnity | Attending: Family Medicine | Admitting: Family Medicine

## 2019-06-18 ENCOUNTER — Other Ambulatory Visit: Payer: Self-pay

## 2019-06-18 ENCOUNTER — Ambulatory Visit: Payer: Private Health Insurance - Indemnity | Admitting: Family Medicine

## 2019-06-18 VITALS — BP 150/72 | HR 74 | Temp 98.2°F | Resp 18 | Ht 62.0 in | Wt 155.0 lb

## 2019-06-18 DIAGNOSIS — I10 Essential (primary) hypertension: Secondary | ICD-10-CM

## 2019-06-18 DIAGNOSIS — Z8673 Personal history of transient ischemic attack (TIA), and cerebral infarction without residual deficits: Secondary | ICD-10-CM

## 2019-06-18 NOTE — Patient Instructions (Signed)
Managing Your Hypertension Hypertension is commonly called high blood pressure. This is when the force of your blood pressing against the walls of your arteries is too strong. Arteries are blood vessels that carry blood from your heart throughout your body. Hypertension forces the heart to work harder to pump blood, and may cause the arteries to become narrow or stiff. Having untreated or uncontrolled hypertension can cause heart attack, stroke, kidney disease, and other problems. What are blood pressure readings? A blood pressure reading consists of a higher number over a lower number. Ideally, your blood pressure should be below 120/80. The first ("top") number is called the systolic pressure. It is a measure of the pressure in your arteries as your heart beats. The second ("bottom") number is called the diastolic pressure. It is a measure of the pressure in your arteries as the heart relaxes. What does my blood pressure reading mean? Blood pressure is classified into four stages. Based on your blood pressure reading, your health care provider may use the following stages to determine what type of treatment you need, if any. Systolic pressure and diastolic pressure are measured in a unit called mm Hg. Normal  Systolic pressure: below 120.  Diastolic pressure: below 80. Elevated  Systolic pressure: 120-129.  Diastolic pressure: below 80. Hypertension stage 1  Systolic pressure: 130-139.  Diastolic pressure: 80-89. Hypertension stage 2  Systolic pressure: 140 or above.  Diastolic pressure: 90 or above. What health risks are associated with hypertension? Managing your hypertension is an important responsibility. Uncontrolled hypertension can lead to:  A heart attack.  A stroke.  A weakened blood vessel (aneurysm).  Heart failure.  Kidney damage.  Eye damage.  Metabolic syndrome.  Memory and concentration problems. What changes can I make to manage my  hypertension? Hypertension can be managed by making lifestyle changes and possibly by taking medicines. Your health care provider will help you make a plan to bring your blood pressure within a normal range. Eating and drinking   Eat a diet that is high in fiber and potassium, and low in salt (sodium), added sugar, and fat. An example eating plan is called the DASH (Dietary Approaches to Stop Hypertension) diet. To eat this way: ? Eat plenty of fresh fruits and vegetables. Try to fill half of your plate at each meal with fruits and vegetables. ? Eat whole grains, such as whole wheat pasta, brown rice, or whole grain bread. Fill about one quarter of your plate with whole grains. ? Eat low-fat diary products. ? Avoid fatty cuts of meat, processed or cured meats, and poultry with skin. Fill about one quarter of your plate with lean proteins such as fish, chicken without skin, beans, eggs, and tofu. ? Avoid premade and processed foods. These tend to be higher in sodium, added sugar, and fat.  Reduce your daily sodium intake. Most people with hypertension should eat less than 1,500 mg of sodium a day.  Limit alcohol intake to no more than 1 drink a day for nonpregnant women and 2 drinks a day for men. One drink equals 12 oz of beer, 5 oz of wine, or 1 oz of hard liquor. Lifestyle  Work with your health care provider to maintain a healthy body weight, or to lose weight. Ask what an ideal weight is for you.  Get at least 30 minutes of exercise that causes your heart to beat faster (aerobic exercise) most days of the week. Activities may include walking, swimming, or biking.  Include exercise   to strengthen your muscles (resistance exercise), such as weight lifting, as part of your weekly exercise routine. Try to do these types of exercises for 30 minutes at least 3 days a week.  Do not use any products that contain nicotine or tobacco, such as cigarettes and e-cigarettes. If you need help quitting,  ask your health care provider.  Control any long-term (chronic) conditions you have, such as high cholesterol or diabetes. Monitoring  Monitor your blood pressure at home as told by your health care provider. Your personal target blood pressure may vary depending on your medical conditions, your age, and other factors.  Have your blood pressure checked regularly, as often as told by your health care provider. Working with your health care provider  Review all the medicines you take with your health care provider because there may be side effects or interactions.  Talk with your health care provider about your diet, exercise habits, and other lifestyle factors that may be contributing to hypertension.  Visit your health care provider regularly. Your health care provider can help you create and adjust your plan for managing hypertension. Will I need medicine to control my blood pressure? Your health care provider may prescribe medicine if lifestyle changes are not enough to get your blood pressure under control, and if:  Your systolic blood pressure is 130 or higher.  Your diastolic blood pressure is 80 or higher. Take medicines only as told by your health care provider. Follow the directions carefully. Blood pressure medicines must be taken as prescribed. The medicine does not work as well when you skip doses. Skipping doses also puts you at risk for problems. Contact a health care provider if:  You think you are having a reaction to medicines you have taken.  You have repeated (recurrent) headaches.  You feel dizzy.  You have swelling in your ankles.  You have trouble with your vision. Get help right away if:  You develop a severe headache or confusion.  You have unusual weakness or numbness, or you feel faint.  You have severe pain in your chest or abdomen.  You vomit repeatedly.  You have trouble breathing. Summary  Hypertension is when the force of blood pumping  through your arteries is too strong. If this condition is not controlled, it may put you at risk for serious complications.  Your personal target blood pressure may vary depending on your medical conditions, your age, and other factors. For most people, a normal blood pressure is less than 120/80.  Hypertension is managed by lifestyle changes, medicines, or both. Lifestyle changes include weight loss, eating a healthy, low-sodium diet, exercising more, and limiting alcohol. This information is not intended to replace advice given to you by your health care provider. Make sure you discuss any questions you have with your health care provider. Document Released: 04/09/2012 Document Revised: 11/07/2018 Document Reviewed: 06/13/2016 Elsevier Patient Education  2020 [employer]  

## 2019-06-18 NOTE — Progress Notes (Signed)
Established Patient Office Visit  Subjective:  Patient ID: Heather Rogers, female    DOB: [birth year]  Age: 73 y.o. MRN: [id number]  CC:  Chief Complaint  Patient presents with  . Blood Pressure Check    HPI Heather Rogers presents for follow-up of Hypertension and history of CVA.  She has a booklet of her home blood pressures and she states that her home blood pressures are in the 130's/90's and no headaches or dizziness related to her blood pressure. She also has had a prior CVA and she reports some residual issues with her vision and memory. She is compliant with her BP medications as her daughter arranges a medication planner for her and also brings her healthy meals. She is not sure why her BP is high here in the office. She has a home BP monitor with cuff and checks her BP several times per day usually in the left arm.  She admits to sometimes her blood pressure is a little higher but she will recheck it a few times until bed is at a more normal level.  She has had no further issues with new strokelike symptoms-no new focal numbness or weakness or new visual changes.  She denies slurred speech or difficulty swallowing.  She has had no chest pain or palpitations and no shortness of breath or cough.  Past Medical History:  Diagnosis Date  . Diabetes ([place])   . Hypertension   . Stroke (cerebrum) [facility])     Past Surgical History:  Procedure Laterality Date  . hysterectomy    . LOOP RECORDER INSERTION N/A 05/20/2018   Procedure: LOOP RECORDER INSERTION;  Surgeon: Thompson Grayer, MD;  Location: [place] CV LAB;  Service: Cardiovascular;  Laterality: N/A;  . TEE WITHOUT CARDIOVERSION N/A 05/20/2018   Procedure: TRANSESOPHAGEAL ECHOCARDIOGRAM (TEE);  Surgeon: Sueanne Margarita, MD;  Location: [facility] ENDOSCOPY;  Service: Cardiovascular;  Laterality: N/A;    Family History  Problem Relation Age of Onset  . Hypertension Mother   . Stroke Mother   . Hypertension Father     Social  History   Socioeconomic History  . Marital status: Divorced    Spouse name: Not on file  . Number of children: Not on file  . Years of education: Not on file  . Highest education level: Not on file  Occupational History  . Not on file  Social Needs  . Financial resource strain: Not on file  . Food insecurity    Worry: Not on file    Inability: Not on file  . Transportation needs    Medical: Not on file    Non-medical: Not on file  Tobacco Use  . Smoking status: Former Research scientist (life sciences)  . Smokeless tobacco: Never Used  Substance and Sexual Activity  . Alcohol use: Never    Frequency: Never  . Drug use: Never  . Sexual activity: Not Currently  Lifestyle  . Physical activity    Days per week: Not on file    Minutes per session: Not on file  . Stress: Not on file  Relationships  . Social Herbalist on phone: Not on file    Gets together: Not on file    Attends religious service: Not on file    Active member of club or organization: Not on file    Attends meetings of clubs or organizations: Not on file    Relationship status: Not on file  . Intimate partner violence  Fear of current or ex partner: Not on file    Emotionally abused: Not on file    Physically abused: Not on file    Forced sexual activity: Not on file  Other Topics Concern  . Not on file  Social History Narrative   Lives in [place].    Outpatient Medications Prior to Visit  Medication Sig Dispense Refill  . ACCU-CHEK FASTCLIX LANCETS MISC Check blood sugar fasting and before meals and again if pt feels bad (symptoms of hypo). 100 each 12  . allopurinol (ZYLOPRIM) 100 MG tablet Take 1 tablet (100 mg total) by mouth daily. 30 tablet 0  . amLODipine (NORVASC) 10 MG tablet Take 1 tablet (10 mg total) by mouth daily. To lower blood pressure 90 tablet 3  . atorvastatin (LIPITOR) 80 MG tablet One pill by mouth in the evening to lower cholesterol 90 tablet 4  . Blood Glucose Monitoring Suppl (ACCU-CHEK  GUIDE) w/Device KIT 1 each by Does not apply route as directed. 1 kit 0  . carvedilol (COREG) 25 MG tablet Take 1 tablet (25 mg total) by mouth 2 (two) times daily with a meal. 60 tablet 11  . gabapentin (NEURONTIN) 100 MG capsule Take 1 capsule (100 mg total) by mouth 3 (three) times daily. 270 capsule 3  . glucose blood (ACCU-CHEK GUIDE) test strip Check blood sugar fasting and before meals and again if pt feels bad (symptoms of hypo). 100 each 12  . hydrochlorothiazide (HYDRODIURIL) 12.5 MG tablet Take 1 tablet (12.5 mg total) by mouth daily. 90 tablet 3  . sitaGLIPtin-metformin (JANUMET) 50-500 MG tablet Once daily with biggest meal of the day 90 tablet 3  . Study - [facility] - apixaban 2.59m or Placebo Take 2.5 mg by mouth 2 (two) times daily.    . Study - [place] - aspirin 861mor Placebo Take 81 mg by mouth daily.    . valsartan (DIOVAN) 320 MG tablet Take 1 tablet (320 mg total) by mouth daily. To lower blood pressure 90 tablet 3   No facility-administered medications prior to visit.     Allergies  Allergen Reactions  . Sulfa Antibiotics     Itching     ROS Review of Systems  Constitutional: Positive for fatigue (occasional, mild). Negative for chills and diaphoresis.  HENT: Negative for sore throat and trouble swallowing.   Eyes: Positive for visual disturbance. Negative for photophobia.  Respiratory: Negative for cough and shortness of breath.   Gastrointestinal: Negative for abdominal pain, constipation, diarrhea and nausea.  Endocrine: Negative for polydipsia, polyphagia and polyuria.  Genitourinary: Negative for dysuria and frequency.  Musculoskeletal: Negative for arthralgias and back pain.  Neurological: Negative for dizziness and headaches.  Hematological: Negative for adenopathy. Does not bruise/bleed easily.      Objective:    Physical Exam  Constitutional: She is oriented to person, place, and time. She appears well-developed and well-nourished.  Smaller framed  older female in NAD sitting in exam chair wearing mask as per office COVID-19 precautions  Neck: Normal range of motion. Neck supple. No JVD present.  Cardiovascular: Normal rate and regular rhythm.  Pulmonary/Chest: Effort normal and breath sounds normal.  Abdominal: Soft. Bowel sounds are normal. There is no abdominal tenderness. There is no rebound and no guarding.  Musculoskeletal:        General: No tenderness or edema.     Comments: No CVA tenderness  Lymphadenopathy:    She has no cervical adenopathy.  Neurological: She is alert and oriented  to person, place, and time.  Skin: Skin is warm and dry.  Psychiatric: She has a normal mood and affect.  Nursing note and vitals reviewed.   BP (!) 178/72 (BP Location: Left Arm, Patient Position: Sitting, Cuff Size: Normal)   Pulse 74   Temp 98.2 F (36.8 C) (Oral)   Resp 18   Ht 5' 2"  (1.575 m)   Wt 155 lb (70.3 kg)   SpO2 98%   BMI 28.35 kg/m ; repeat BP 152/70 Wt Readings from Last 3 Encounters:  06/18/19 155 lb (70.3 kg)  05/27/19 156 lb (70.8 kg)  09/25/18 148 lb 12.8 oz (67.5 kg)     Health Maintenance Due  Topic Date Due  . Hepatitis C Screening  1946/05/16  . MAMMOGRAM  10/14/1995  . COLONOSCOPY  10/14/1995  . DEXA SCAN  10/14/2010  . PNA vac Low Risk Adult (2 of 2 - PPSV23) 05/17/2019     No results found for: TSH Lab Results  Component Value Date   WBC 5.4 05/27/2019   HGB 11.6 05/27/2019   HCT 35.8 05/27/2019   MCV 71 (L) 05/27/2019   PLT 288 05/27/2019   Lab Results  Component Value Date   NA 141 05/27/2019   K 4.5 05/27/2019   CO2 25 05/27/2019   GLUCOSE 93 05/27/2019   BUN 19 05/27/2019   CREATININE 0.85 05/27/2019   BILITOT 0.3 05/27/2019   ALKPHOS 98 05/27/2019   AST 13 05/27/2019   ALT 15 05/27/2019   PROT 7.4 05/27/2019   ALBUMIN 4.5 05/27/2019   CALCIUM 10.0 05/27/2019   ANIONGAP 11 05/16/2018   Lab Results  Component Value Date   CHOL 126 05/16/2018   Lab Results  Component  Value Date   HDL 27 (L) 05/16/2018   Lab Results  Component Value Date   LDLCALC 64 05/16/2018   Lab Results  Component Value Date   TRIG 175 (H) 05/16/2018   Lab Results  Component Value Date   CHOLHDL 4.7 05/16/2018   Lab Results  Component Value Date   HGBA1C 6.1 (A) 05/27/2019      Assessment & Plan:  1. Essential hypertension 2. Status post CVA BP lower on recheck but will have patient return to meet with the clinical pharmacist and bring her home blood pressure monitor to make sure that her home monitor is accurately reflecting her true blood pressure. She will continue her current medications at this time. Labs from her last visit were reviewed and discussed with the patient at today's visit. Information on monitoring blood pressure provided as part of after visit summary.  An After Visit Summary was printed and given to the patient.  Follow-up:  Return in about 4 months (around 10/16/2019) for DM/HTN/chronic issues- 2-3 week f/u with Lurena Joiner and bring home BP monitor.   Antony Blackbird, MD

## 2019-06-21 LAB — CUP PACEART REMOTE DEVICE CHECK
Date Time Interrogation Session: 20201121134628
Implantable Pulse Generator Implant Date: 20191022

## 2019-06-22 ENCOUNTER — Ambulatory Visit (INDEPENDENT_AMBULATORY_CARE_PROVIDER_SITE_OTHER): Payer: Private Health Insurance - Indemnity | Admitting: *Deleted

## 2019-06-22 DIAGNOSIS — I6300 Cerebral infarction due to thrombosis of unspecified precerebral artery: Secondary | ICD-10-CM

## 2019-07-09 ENCOUNTER — Encounter: Payer: Self-pay | Admitting: Pharmacist

## 2019-07-09 ENCOUNTER — Other Ambulatory Visit: Payer: Self-pay

## 2019-07-09 ENCOUNTER — Ambulatory Visit: Payer: Private Health Insurance - Indemnity | Attending: Family Medicine | Admitting: Pharmacist

## 2019-07-09 VITALS — BP 140/72 | HR 72

## 2019-07-09 DIAGNOSIS — I10 Essential (primary) hypertension: Secondary | ICD-10-CM

## 2019-07-09 NOTE — Patient Instructions (Signed)
Thank you for coming to see [place] today.   Blood pressure today is improved from last visit.  Continue taking blood pressure medications as prescribed.   Limiting salt and caffeine, as well as exercising as able for at least 30 minutes for 5 days out of the week, can also help you lower your blood pressure.  Take your blood pressure at home if you are able. Please write down these numbers and bring them to your visits.  If you have any questions about medications, please call me [phone].  Lurena Joiner

## 2019-07-09 NOTE — Progress Notes (Signed)
   S:    PCP: Dr. Chapman Fitch  Patient arrives in good spirits. Presents to the clinic for hypertension evaluation, counseling, and management.  Patient was referred and last seen by Primary Care Provider on 06/18/19.    Patient reports adherence with medications.  Current BP Medications include:  Amlodipine 10 mg daily, HCTZ 12.5 mg daily , Valsartan 320 mg daily  Dietary habits include: compliant with salt restriction; drinks 1 cup of coffee daily  Exercise habits include: limited to ADLs Family / Social history:  - Fhx: HTN (mother, father); stroke (mother) - Tobacco: former smoker   O:  Vitals:   07/09/19 1055  BP: 140/72  Pulse: 72   Home BP readings: 130s-140s/60s-70s  Last 3 Office BP readings: BP Readings from Last 3 Encounters:  06/18/19 (!) 150/72  05/27/19 (!) 163/75  09/25/18 (!) 184/78    BMET    Component Value Date/Time   NA 141 05/27/2019 1208   K 4.5 05/27/2019 1208   CL 105 05/27/2019 1208   CO2 25 05/27/2019 1208   GLUCOSE 93 05/27/2019 1208   GLUCOSE 100 (H) 05/16/2018 0420   BUN 19 05/27/2019 1208   CREATININE 0.85 05/27/2019 1208   CALCIUM 10.0 05/27/2019 1208   GFRNONAA 68 05/27/2019 1208   GFRAA 79 05/27/2019 1208   Renal function: CrCl cannot be calculated (Patient's most recent lab result is older than the maximum 21 days allowed.).  Clinical ASCVD: Yes  The ASCVD Risk score Mikey Bussing DC Jr., et al., 2013) failed to calculate for the following reasons:   The patient has a prior MI or stroke diagnosis  A/P: Hypertension longstanding currently above goal on current medication. SBP goal = 130 mmHg. Patient is adherent to medications. Blood pressure readings at home seem to correlate with her clinic reading today. She has an appointment with Neurology at the end of this month. Will hold off on any changes today.  -Continued current medications.  -Counseled on lifestyle modifications for blood pressure control including reduced dietary sodium,  increased exercise, adequate sleep  Results reviewed and written information provided.   Total time in face-to-face counseling 15 minutes.   F/U Clinic Visit in Jan with PCP.  Benard Halsted, PharmD, [place] [phone]

## 2019-07-23 ENCOUNTER — Ambulatory Visit (INDEPENDENT_AMBULATORY_CARE_PROVIDER_SITE_OTHER): Payer: Medicare HMO | Admitting: *Deleted

## 2019-07-23 DIAGNOSIS — I6300 Cerebral infarction due to thrombosis of unspecified precerebral artery: Secondary | ICD-10-CM

## 2019-07-23 LAB — CUP PACEART REMOTE DEVICE CHECK
Date Time Interrogation Session: 20201224134614
Implantable Pulse Generator Implant Date: 20191022

## 2019-07-26 NOTE — Progress Notes (Signed)
ILR remote 

## 2019-07-28 ENCOUNTER — Ambulatory Visit (INDEPENDENT_AMBULATORY_CARE_PROVIDER_SITE_OTHER): Payer: Medicare HMO | Admitting: Adult Health

## 2019-07-28 ENCOUNTER — Encounter: Payer: Self-pay | Admitting: Adult Health

## 2019-07-28 ENCOUNTER — Other Ambulatory Visit: Payer: Self-pay

## 2019-07-28 VITALS — BP 134/76 | HR 79 | Temp 97.4°F | Ht 62.0 in | Wt 160.8 lb

## 2019-07-28 DIAGNOSIS — I639 Cerebral infarction, unspecified: Secondary | ICD-10-CM

## 2019-07-28 DIAGNOSIS — E785 Hyperlipidemia, unspecified: Secondary | ICD-10-CM

## 2019-07-28 DIAGNOSIS — E119 Type 2 diabetes mellitus without complications: Secondary | ICD-10-CM

## 2019-07-28 DIAGNOSIS — H53461 Homonymous bilateral field defects, right side: Secondary | ICD-10-CM

## 2019-07-28 DIAGNOSIS — I10 Essential (primary) hypertension: Secondary | ICD-10-CM

## 2019-07-28 NOTE — Patient Instructions (Signed)
Continue [place] trial of medication  and atorvastatin for secondary stroke prevention  Continue to follow up with PCP regarding cholesterol, blood pressure and diabetes management   Continue to follow with ophthalmology -we will try to obtain those records from your recent office visits  Continue to monitor blood pressure at home  Loop recorder will continue to be monitored  Maintain strict control of hypertension with blood pressure goal below 130/90, diabetes with hemoglobin A1c goal below 6.5% and cholesterol with LDL cholesterol (bad cholesterol) goal below 70 mg/dL. I also advised the patient to eat a healthy diet with plenty of whole grains, cereals, fruits and vegetables, exercise regularly and maintain ideal body weight.  As you have been overall stable from a stroke standpoint, no need to schedule follow-up visit at this time but please do not hesitate with any questions or concerns       Thank you for coming to see [place] at [facility]. I hope we have been able to provide you high quality care today.  You may receive a patient satisfaction survey over the next few weeks. We would appreciate your feedback and comments so that we may continue to improve ourselves and the health of our patients.

## 2019-07-28 NOTE — Progress Notes (Signed)
[facility] [address] [place]. [place] [address] [phone]       OFFICE FOLLOW UP NOTE  Ms. HIROKO TREGRE Date of Birth:  24-Jul-1946 Medical Record Number:  [id number]   Reason for Referral:  stroke follow up  CHIEF COMPLAINT:  Chief Complaint  Patient presents with   Hospitalization Follow-up    Alone. Rm 9. No new concerns at this time.     HPI:  Update 07/28/2019: Ms. Bevacqua is a 73 year old female who is being seen today for stroke follow-up.  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  She continues to follow with ophthalmology due to ongoing visual concerns which she believes are related to cataracts but states "they find something different every time".  We will attempt to obtain those records.  She continues to participate in [place] trial through [facility] team currently receiving blinded blood thinners for secondary stroke prevention.  She continues on atorvastatin without side effects or myalgias.  Blood pressure today satisfactory at 134/76.  Prior A1c 6.1.  Loop recorder is not shown atrial fibrillation thus far.  No further concerns at this time.   Update 09/25/2018: Ms. Keckler is a 73 year old female who is being seen today for follow-up visit regarding left embolic PCA infarct in 37/1696 and is accompanied by her daughter.  She has been stable from a stroke standpoint with residual deficits of right homonymous superior quadrantanopia.  She has not followed up with ophthalmology at this time as she attempted to schedule evaluation with retina specialist due to history of diabetes but needed referral first.  She continues to live independently where she maintains all ADLs and majority of IADLs with minimal assistance and bill paying and cooking.  She continues to participate in [place] trial and is currently receiving blinded blood thinners for secondary stroke prevention.  Continues on  atorvastatin without side effects myalgias.  Glucose levels at home have been averaging from 70-90.  Daughter is concerned due to decreased appetite as she typically will not eat lunch as she just does not feel hungry.  She has lost approximately 30 pounds since her stroke back in 04/2018.  She plans on following up with PCP for possible needed diabetic medication adjustment due to frequent episodes of hypoglycemia.  Blood pressure today elevated at 184/78 but she does continue to monitor at home which typically ranges 150s/70s.  She continues to be followed by PCP for ongoing HTN management.  Loop recorder has not shown atrial fibrillation thus far.  Denies new or worsening stroke/TIA symptoms.    INITIAL VISIT 06/25/2018: LIKISHA ALLES is being seen today for initial visit in the office for left embolic PCA infarct secondary to undetermined source on 05/15/2018. History obtained from patient, daughter and chart review. Reviewed all radiology images and labs personally.  Ms. GABBY RACKERS is a 73 y.o. female with history of HTN and DB  who presented with blurred vision, hypertensive urgency and altered sensation R face.  According to notes, her visual symptoms began approximately 6 days prior to ED admission and as she was being brought to her ophthalmologist appointment by a neighbor, she started to complain of right-sided numbness therefore EMS was called.  CT head reviewed and showed subacute left occipital stroke with left thalamic lacunae.  MRI brain reviewed and showed left PCA occlusion and left PCA infarct along with old left thalamic lacunae and small vessel disease.  MRA head negative.  MRA neck showed moderate  to severe left ICA petrous and moderate right ICA cavernous segment stenosis with moderate mid BA stenosis.  Carotid Doppler showed bilateral ICA 1 to 39% stenosis.  2D echo showed an EF of 55 to 60% without cardiac source of embolus identified.  It was determined that left PCA  infarct was embolic secondary to unknown source, is recommended to undergo TEE with possible loop recorder placement as outpatient.  Patient was initially found to be in hypertensive urgency with BP 196/83 but normalized throughout admission and recommended long-term BP goal normotensive range.  LDL 64 and recommended atorvastatin 80 mg daily.  A1c 10.9 and recommended tight glycemic control with close PCP follow-up for DM management.  Patient was not on antithrombotic PTA and recommended DAPT for 3 weeks and aspirin alone.  Of note, the patient has a history of some noncompliance noncompliance with her medications including blood pressure medications and only occasionally will take her aspirin.  She does have 2 neighbors that monitor her and assist with her care along with ensuring glucose monitoring and medication administration.  Patient was discharged with recommendations of home health PT/OT in stable condition.  Patient is being seen today for hospital follow-up.  She did undergo TEE which did not show PFO or cardiac source of embolus therefore loop recorder placed to rule out atrial fibrillation as potential cause of infarct. She is currently participating in [place] trial. She is unsure if vision has improved since discharge and continues to have blurry vision in right periphery. She was evaluated by home therapies and after the first assessment, it was determined that patient did not need additional therapies.  Blood pressure today 172/82 and as she does monitor at home typically 179/82 (dauhter states typically in the 170s). She continues to be followed by PCP due to continued difficulties maintaining blood pressure. She does monitor glucose at home and being followed by PCP as well for management. She does live on her own but daughter helps with cooking which is new since her stroke. She also has not returned to driving which her daughter helps with transportation. She has not followed up with  ophthalmologist since discharge. Daughter is planning on finding a new ophthalmologist as the ophthalmologist she was at when the stroke occurred apparently does not accept her insurance.  She has no further concerns at this time.  Denies new or worsening stroke/TIA symptoms.    ROS:   14 system review of systems performed and negative with exception of visual impairment  PMH:  Past Medical History:  Diagnosis Date   Diabetes ([place])    Hypertension    Stroke (cerebrum) ([place])     PSH:  Past Surgical History:  Procedure Laterality Date   hysterectomy     LOOP RECORDER INSERTION N/A 05/20/2018   Procedure: LOOP RECORDER INSERTION;  Surgeon: Thompson Grayer, MD;  Location: [facility];  Service: Cardiovascular;  Laterality: N/A;   TEE WITHOUT CARDIOVERSION N/A 05/20/2018   Procedure: TRANSESOPHAGEAL ECHOCARDIOGRAM (TEE);  Surgeon: Sueanne Margarita, MD;  Location: [facility];  Service: Cardiovascular;  Laterality: N/A;    Social History:  Social History   Socioeconomic History   Marital status: Divorced    Spouse name: Not on file   Number of children: Not on file   Years of education: Not on file   Highest education level: Not on file  Occupational History   Not on file  Tobacco Use   Smoking status: Former Smoker   Smokeless tobacco: Never Used  Substance and Sexual Activity   Alcohol use: Never   Drug use: Never   Sexual activity: Not Currently  Other Topics Concern   Not on file  Social History Narrative   Lives in [place].   Social Determinants of Health   Financial Resource Strain:    Difficulty of Paying Living Expenses: Not on file  Food Insecurity:    Worried About Charity fundraiser in the Last Year: Not on file   [employer] of Food in the Last Year: Not on file  Transportation Needs:    Lack of Transportation (Medical): Not on file   Lack of Transportation (Non-Medical): Not on file  Physical Activity:    Days of Exercise  per Week: Not on file   Minutes of Exercise per Session: Not on file  Stress:    Feeling of Stress : Not on file  Social Connections:    Frequency of Communication with Friends and Family: Not on file   Frequency of Social Gatherings with Friends and Family: Not on file   Attends Religious Services: Not on file   Active Member of Clubs or Organizations: Not on file   Attends Archivist Meetings: Not on file   Marital Status: Not on file  Intimate Partner Violence:    Fear of Current or Ex-Partner: Not on file   Emotionally Abused: Not on file   Physically Abused: Not on file   Sexually Abused: Not on file    Family History:  Family History  Problem Relation Age of Onset   Hypertension Mother    Stroke Mother    Hypertension Father     Medications:   Current Outpatient Medications on File Prior to Visit  Medication Sig Dispense Refill   ACCU-CHEK FASTCLIX LANCETS MISC Check blood sugar fasting and before meals and again if pt feels bad (symptoms of hypo). 100 each 12   amLODipine (NORVASC) 10 MG tablet Take 1 tablet (10 mg total) by mouth daily. To lower blood pressure 90 tablet 3   atorvastatin (LIPITOR) 80 MG tablet One pill by mouth in the evening to lower cholesterol 90 tablet 4   Blood Glucose Monitoring Suppl (ACCU-CHEK GUIDE) w/Device KIT 1 each by Does not apply route as directed. 1 kit 0   carvedilol (COREG) 25 MG tablet Take 1 tablet (25 mg total) by mouth 2 (two) times daily with a meal. 60 tablet 11   gabapentin (NEURONTIN) 100 MG capsule Take 1 capsule (100 mg total) by mouth 3 (three) times daily. 270 capsule 3   glucose blood (ACCU-CHEK GUIDE) test strip Check blood sugar fasting and before meals and again if pt feels bad (symptoms of hypo). 100 each 12   hydrochlorothiazide (HYDRODIURIL) 12.5 MG tablet Take 1 tablet (12.5 mg total) by mouth daily. 90 tablet 3   sitaGLIPtin-metformin (JANUMET) 50-500 MG tablet Once daily with  biggest meal of the day 90 tablet 3   Study - [facility] - apixaban 2.50m or Placebo Take 2.5 mg by mouth 2 (two) times daily.     Study - [place] - aspirin 868mor Placebo Take 81 mg by mouth daily.     valsartan (DIOVAN) 320 MG tablet Take 1 tablet (320 mg total) by mouth daily. To lower blood pressure 90 tablet 3   allopurinol (ZYLOPRIM) 100 MG tablet Take 1 tablet (100 mg total) by mouth daily. 30 tablet 0   No current facility-administered medications on file prior to visit.    Allergies:  Allergies  Allergen Reactions   Sulfa Antibiotics     Itching      Physical Exam  Vitals:   07/28/19 1425  BP: 134/76  Pulse: 79  Temp: (!) 97.4 F (36.3 C)  TempSrc: Oral  Weight: 160 lb 12.8 oz (72.9 kg)  Height: 5' 2"  (1.575 m)   Body mass index is 29.41 kg/m. No exam data present  General: well developed, well nourished, pleasant elderly African-American female, seated, in no evident distress Head: head normocephalic and atraumatic.   Neck: supple with no carotid or supraclavicular bruits Cardiovascular: regular rate and rhythm, no murmurs Musculoskeletal: no deformity Skin:  no rash/petichiae Vascular:  Normal pulses all extremities  Neurologic Exam Mental Status: Awake and fully alert. Oriented to place and time. Recent and remote memory intact. Attention span, concentration and fund of knowledge appropriate. Mood and affect appropriate.  Cranial Nerves: Pupils equal, briskly reactive to light. Extraocular movements full without nystagmus. Visual fields right homonymous superior quadrantanopia.  Subjective blurred vision left eye superior outer quadrant.  Hearing intact. Facial sensation intact. Face, tongue, palate moves normally and symmetrically.  Motor: Normal bulk and tone. Normal strength in all tested extremity muscles. Sensory.: intact to touch , pinprick , position and vibratory sensation.  Coordination: Rapid alternating movements normal in all extremities.  Finger-to-nose and heel-to-shin performed accurately bilaterally. Gait and Station: Arises from chair without difficulty. Stance is normal. Gait demonstrates normal stride length and balance.  Reflexes: 1+ and symmetric. Toes downgoing.       Diagnostic Data (Labs, Imaging, Testing)  CT HEAD WO CONTRAST 05/15/18 IMPRESSION: 1. Moderate acute infarct in the left occipital lobe. 2. Lateral left thalamus lacune.  MR BRAIN WO CONTRAST MR MRA HEAD  MR MRA NECK 05/15/2018 IMPRESSION: 1. Positive for segmental Left PCA occlusion with acute to subacute patchy Left PCA infarct. Territory from the left hippocampus through the medial left occipital pole is affected with edema but no associated hemorrhage or mass effect. 2. Neck MRA is negative for hemodynamically significant stenosis in the neck (50% Left ICA origin stenosis), but Head MRA is positive for: - moderate to severe Left ICA petrous segment and moderate Right ICA cavernous segment stenoses. - moderate mid Basilar Artery stenosis. 3. Chronic small vessel ischemia in the left thalamus. Mild to moderate for age cerebral white matter signal changes also probably small vessel disease related.  ECHOCARDIOGRAM 05/15/2018 Impressions: - Normal LV size with moderate LV hypertrophy. EF 55-60%. Normal RV   size and systolic function. No significant valvular   abnormalities.  ECHO TEE 05/20/2018 Study Conclusions - Left ventricle: Systolic function was normal. The estimated   ejection fraction was in the range of 55% to 60%. Wall motion was   normal; there were no regional wall motion abnormalities. - Aortic valve: Trileaflet; mildly thickened, mildly calcified   leaflets. - Mitral valve: There was mild regurgitation. - Left atrium: The atrium was mildly dilated. No evidence of   thrombus in the atrial cavity or appendage. - Right atrium: No evidence of thrombus in the atrial cavity or   appendage. - Atrial septum: No  defect or patent foramen ovale was identified. - Tricuspid valve: There was mild regurgitation. - Pulmonic valve: There was trivial regurgitation. Impressions: - No cardiac source of emboli was indentified    ASSESSMENT: CAMIKA MARSICO is a 73 y.o. year old female here with left embolic PCA infarct on 60/73/7106 secondary to unknown source therefore loop recorder placed on 05/20/2018. Vascular risk factors include  HTN, HLD and DM.  S residual stroke deficits of right homonymous superior quadrantanopia but otherwise stable from stroke standpoint.  She continues to participate in [place] trial    PLAN:  1. Left PCA infarct: Continue atorvastatin for secondary stroke prevention.  Patient currently participating in [place] trial therefore blood thinners will be managed by [employer] research during trial participation.  Maintain strict control of hypertension with blood pressure goal below 130/90, diabetes with hemoglobin A1c goal below 6.5% and cholesterol with LDL cholesterol (bad cholesterol) goal below 70 mg/dL.  I also advised the patient to eat a healthy diet with plenty of whole grains, cereals, fruits and vegetables, exercise regularly with at least 30 minutes of continuous activity daily and maintain ideal body weight.  Continue to monitor loop recorder for possible atrial fibrillation 2. Right homonymous superior quadrantanopia: Stable without worsening 3. Subjective visual impairment: We will attempt to obtain records from ophthalmology as patient believes worsening blurred vision secondary to cataracts or glaucoma and stroke impairment has been stable.  Advised to continue to follow with ophthalmology as scheduled 4. HTN: Advised to continue current treatment regimen. Advised to continue to monitor at home along with continued follow-up with PCP for management 5. HLD: Advised to continue current treatment regimen along with continued follow-up with PCP for future prescribing and  monitoring of lipid panel 6. DMII: Advised to continue to monitor glucose levels at home along with continued follow-up with PCP for management and monitoring   She continues to be stable from a stroke standpoint which was over 1 year ago.  She can follow-up as needed and advised to call with any questions or concerns   Greater than 50% of time during this 25 minute visit was spent on counseling, discussion regarding visual impairment and ophthalmology follow-up, explanation of diagnosis of left PCA infarct, reviewing risk factor management of HLD, HTN and DM, planning of further management along with potential future management, and discussion with patient answering all questions to satisfaction    Frann Rider, AGNP-BC  [facility] [address] [place], [place] [address]  Phone [phone] Fax [phone] Note: This document was prepared with digital dictation and possible smart phrase technology. Any transcriptional errors that result from this process are unintentional.

## 2019-08-04 NOTE — Progress Notes (Signed)
I agree with the above plan 

## 2019-08-05 ENCOUNTER — Telehealth: Payer: Self-pay

## 2019-08-05 NOTE — Telephone Encounter (Signed)
Still awaiting fax of medical records from [facility]. Will attempt to fax back.

## 2019-08-07 ENCOUNTER — Ambulatory Visit
Admission: RE | Admit: 2019-08-07 | Discharge: 2019-08-07 | Disposition: A | Discharge status: Home / Self Care | Payer: Medicare HMO | Source: Ambulatory Visit | Attending: Family Medicine | Admitting: Family Medicine

## 2019-08-07 ENCOUNTER — Other Ambulatory Visit: Payer: Self-pay

## 2019-08-07 DIAGNOSIS — Z1239 Encounter for other screening for malignant neoplasm of breast: Secondary | ICD-10-CM

## 2019-08-11 ENCOUNTER — Other Ambulatory Visit: Payer: Self-pay | Admitting: Internal Medicine

## 2019-08-11 ENCOUNTER — Other Ambulatory Visit: Payer: Self-pay | Admitting: Family Medicine

## 2019-08-13 ENCOUNTER — Other Ambulatory Visit: Payer: Self-pay | Admitting: Family Medicine

## 2019-08-13 DIAGNOSIS — N63 Unspecified lump in unspecified breast: Secondary | ICD-10-CM

## 2019-08-21 ENCOUNTER — Ambulatory Visit: Payer: Medicare HMO | Admitting: Family Medicine

## 2019-08-24 ENCOUNTER — Ambulatory Visit (INDEPENDENT_AMBULATORY_CARE_PROVIDER_SITE_OTHER): Payer: Medicare HMO | Admitting: *Deleted

## 2019-08-24 DIAGNOSIS — I6300 Cerebral infarction due to thrombosis of unspecified precerebral artery: Secondary | ICD-10-CM | POA: Diagnosis not present

## 2019-08-24 LAB — CUP PACEART REMOTE DEVICE CHECK
Date Time Interrogation Session: 20210124232802
Implantable Pulse Generator Implant Date: 20191022

## 2019-08-28 ENCOUNTER — Encounter: Payer: Self-pay | Admitting: Family Medicine

## 2019-08-28 ENCOUNTER — Ambulatory Visit: Payer: Medicare HMO | Attending: Family Medicine | Admitting: Family Medicine

## 2019-08-28 ENCOUNTER — Other Ambulatory Visit: Payer: Self-pay

## 2019-08-28 VITALS — BP 150/72 | HR 75 | Ht 62.0 in | Wt 160.0 lb

## 2019-08-28 DIAGNOSIS — I10 Essential (primary) hypertension: Secondary | ICD-10-CM | POA: Diagnosis not present

## 2019-08-28 DIAGNOSIS — Z8673 Personal history of transient ischemic attack (TIA), and cerebral infarction without residual deficits: Secondary | ICD-10-CM

## 2019-08-28 DIAGNOSIS — Z79899 Other long term (current) drug therapy: Secondary | ICD-10-CM | POA: Diagnosis not present

## 2019-08-28 DIAGNOSIS — E119 Type 2 diabetes mellitus without complications: Secondary | ICD-10-CM

## 2019-08-28 LAB — POCT GLYCOSYLATED HEMOGLOBIN (HGB A1C): HbA1c, POC (controlled diabetic range): 6.5 % (ref 0.0–7.0)

## 2019-08-28 LAB — GLUCOSE, POCT (MANUAL RESULT ENTRY): POC Glucose: 97 mg/dL (ref 70–99)

## 2019-08-28 NOTE — Progress Notes (Signed)
Established Patient Office Visit  Subjective:  Patient ID: Heather Rogers, female    DOB: [birth year]  Age: 74 y.o. MRN: [id number]  CC:  Chief Complaint  Patient presents with  . Diabetes    HPI Heather Rogers, 74 yo female who presents in follow-up of chronic medical issues including type 2 diabetes, hypertension and history of CVA.  She reports that her main deficit following her CVA is that when she is walking she will feel as if she is drifting off to the side and has issues with balance.  She has a diary with her of her blood sugars and blood pressures which she checks on a daily basis.  Her fasting blood sugars generally stay in the 120s or less.  She denies any issues with blurred vision, increased thirst or urinary frequency related to her diabetes.  She is taking her home blood pressure and blood pressures at home are generally in the 130s over 70s to 80s and rarely up to 140.  She reports that her blood pressure always tends to be higher when she is at the medical office.  She has had no further episodes of focal numbness or weakness.  With the current COVID-19 pandemic, she tries to remain at home for the most part.  Past Medical History:  Diagnosis Date  . Diabetes ([place])   . Hypertension   . Stroke (cerebrum) [facility])     Past Surgical History:  Procedure Laterality Date  . hysterectomy    . LOOP RECORDER INSERTION N/A 05/20/2018   Procedure: LOOP RECORDER INSERTION;  Surgeon: Thompson Grayer, MD;  Location: [place] [facility];  Service: Cardiovascular;  Laterality: N/A;  . TEE WITHOUT CARDIOVERSION N/A 05/20/2018   Procedure: TRANSESOPHAGEAL ECHOCARDIOGRAM (TEE);  Surgeon: Sueanne Margarita, MD;  Location: [facility] ENDOSCOPY;  Service: Cardiovascular;  Laterality: N/A;    Family History  Problem Relation Age of Onset  . Hypertension Mother   . Stroke Mother   . Hypertension Father     Social History   Socioeconomic History  . Marital status: Divorced    Spouse name:  Not on file  . Number of children: Not on file  . Years of education: Not on file  . Highest education level: Not on file  Occupational History  . Not on file  Tobacco Use  . Smoking status: Former Research scientist (life sciences)  . Smokeless tobacco: Never Used  Substance and Sexual Activity  . Alcohol use: Never  . Drug use: Never  . Sexual activity: Not Currently  Other Topics Concern  . Not on file  Social History Narrative   Lives in [place].   Social Determinants of Health   Financial Resource Strain:   . Difficulty of Paying Living Expenses: Not on file  Food Insecurity:   . Worried About Charity fundraiser in the Last Year: Not on file  . Ran Out of Food in the Last Year: Not on file  Transportation Needs:   . Lack of Transportation (Medical): Not on file  . Lack of Transportation (Non-Medical): Not on file  Physical Activity:   . Days of Exercise per Week: Not on file  . Minutes of Exercise per Session: Not on file  Stress:   . Feeling of Stress : Not on file  Social Connections:   . Frequency of Communication with Friends and Family: Not on file  . Frequency of Social Gatherings with Friends and Family: Not on file  . Attends Religious Services: Not on  file  . Active Member of Clubs or Organizations: Not on file  . Attends Archivist Meetings: Not on file  . Marital Status: Not on file  Intimate Partner Violence:   . Fear of Current or Ex-Partner: Not on file  . Emotionally Abused: Not on file  . Physically Abused: Not on file  . Sexually Abused: Not on file    Outpatient Medications Prior to Visit  Medication Sig Dispense Refill  . ACCU-CHEK FASTCLIX LANCETS MISC Check blood sugar fasting and before meals and again if pt feels bad (symptoms of hypo). 100 each 12  . allopurinol (ZYLOPRIM) 100 MG tablet Take 1 tablet (100 mg total) by mouth daily. 30 tablet 0  . amLODipine (NORVASC) 10 MG tablet Take 1 tablet (10 mg total) by mouth daily. To lower blood pressure 90  tablet 3  . atorvastatin (LIPITOR) 80 MG tablet One pill by mouth in the evening to lower cholesterol 90 tablet 4  . Blood Glucose Monitoring Suppl (ACCU-CHEK GUIDE) w/Device KIT 1 each by Does not apply route as directed. 1 kit 0  . carvedilol (COREG) 25 MG tablet Take 1 tablet (25 mg total) by mouth 2 (two) times daily with a meal. 60 tablet 11  . gabapentin (NEURONTIN) 100 MG capsule Take 1 capsule (100 mg total) by mouth 3 (three) times daily. 270 capsule 3  . glucose blood (ACCU-CHEK GUIDE) test strip Check blood sugar fasting and before meals and again if pt feels bad (symptoms of hypo). 100 each 12  . hydrochlorothiazide (HYDRODIURIL) 12.5 MG tablet Take 1 tablet (12.5 mg total) by mouth daily. 90 tablet 3  . sitaGLIPtin-metformin (JANUMET) 50-500 MG tablet Once daily with biggest meal of the day 90 tablet 3  . Study - [place] - apixaban 2.14m or Placebo Take 2.5 mg by mouth 2 (two) times daily.    . Study - [place] - aspirin 828mor Placebo Take 81 mg by mouth daily.    . valsartan (DIOVAN) 320 MG tablet Take 1 tablet (320 mg total) by mouth daily. To lower blood pressure 90 tablet 3   No facility-administered medications prior to visit.    Allergies  Allergen Reactions  . Sulfa Antibiotics     Itching     ROS Review of Systems  Constitutional: Positive for fatigue (Mild). Negative for chills and fever.  HENT: Negative for sore throat and trouble swallowing.   Eyes: Negative for photophobia and visual disturbance.  Respiratory: Negative for cough and shortness of breath.   Cardiovascular: Negative for chest pain, palpitations and leg swelling.  Gastrointestinal: Negative for blood in stool, constipation, diarrhea and nausea.  Endocrine: Negative for polydipsia, polyphagia and polyuria.  Genitourinary: Negative for dysuria and frequency.  Musculoskeletal: Negative for arthralgias and back pain.  Neurological: Negative for dizziness and headaches.  Hematological: Negative for  adenopathy. Does not bruise/bleed easily.      Objective:    Physical Exam  Constitutional: She is oriented to person, place, and time. She appears well-developed and well-nourished.  Neck: No JVD present.  Cardiovascular: Normal rate and regular rhythm.  Pulmonary/Chest: Effort normal.  Abdominal: Soft. There is no abdominal tenderness. There is no rebound and no guarding.  Musculoskeletal:        General: No tenderness or edema.     Cervical back: Normal range of motion and neck supple.  Lymphadenopathy:    She has no cervical adenopathy.  Neurological: She is alert and oriented to person, place, and time.  Skin:  No active skin breakdown on the feet.  Psychiatric: She has a normal mood and affect. Her behavior is normal.  Nursing note and vitals reviewed.   BP (!) 171/72   Pulse 75   Ht 5' 2" (1.575 m)   Wt 160 lb (72.6 kg)   SpO2 100%   BMI 29.26 kg/m  Repeat blood pressure of 150/72 at end of visit Wt Readings from Last 3 Encounters:  08/28/19 160 lb (72.6 kg)  07/28/19 160 lb 12.8 oz (72.9 kg)  06/18/19 155 lb (70.3 kg)     Health Maintenance Due  Topic Date Due  . Hepatitis C Screening  Apr 02, 1946  . MAMMOGRAM  10/14/1995  . COLONOSCOPY  10/14/1995  . DEXA SCAN  10/14/2010  . PNA vac Low Risk Adult (2 of 2 - PPSV23) 05/17/2019      No results found for: TSH Lab Results  Component Value Date   WBC 5.4 05/27/2019   HGB 11.6 05/27/2019   HCT 35.8 05/27/2019   MCV 71 (L) 05/27/2019   PLT 288 05/27/2019   Lab Results  Component Value Date   NA 141 05/27/2019   K 4.5 05/27/2019   CO2 25 05/27/2019   GLUCOSE 93 05/27/2019   BUN 19 05/27/2019   CREATININE 0.85 05/27/2019   BILITOT 0.3 05/27/2019   ALKPHOS 98 05/27/2019   AST 13 05/27/2019   ALT 15 05/27/2019   PROT 7.4 05/27/2019   ALBUMIN 4.5 05/27/2019   CALCIUM 10.0 05/27/2019   ANIONGAP 11 05/16/2018   Lab Results  Component Value Date   CHOL 126 05/16/2018   Lab Results  Component  Value Date   HDL 27 (L) 05/16/2018   Lab Results  Component Value Date   LDLCALC 64 05/16/2018   Lab Results  Component Value Date   TRIG 175 (H) 05/16/2018   Lab Results  Component Value Date   CHOLHDL 4.7 05/16/2018   Lab Results  Component Value Date   HGBA1C 6.5 08/28/2019      Assessment & Plan:  1. Type 2 diabetes mellitus treated without insulin ([place]) She reports that her blood sugars remain well controlled.  She is to continue a healthy diet and continue monitoring of her blood sugars.  Continue Janumet.  Patient with blood sugar of 97 and hemoglobin A1c at today's visit was 6.5 consistent with continued good control of her blood sugar/diabetes.  She will also have microalbumin creatinine ratio and BMP at today's visit. - POCT glucose (manual entry) - POCT glycosylated hemoglobin (Hb A1C) - Microalbumin/Creatinine Ratio, Urine - Basic Metabolic Panel  2. Essential hypertension Blood pressure was elevated at today's visit but on recheck was closer to a normal level.  Per her home blood pressure diary, blood pressures are controlled.  She will continue her current medications, low-sodium diet and she is encouraged to remain active with a low impact exercise program such as walking. - Basic Metabolic Panel  3. Status post CVA Patient is currently in a research study where she is either receiving aspirin or low-dose Eliquis for anticoagulation.  He is to continue to monitor her blood pressure with goal of 140/90 or less and continue low-sodium diet and continue compliance with medication.  Continue atorvastatin 80 mg daily.  4. Encounter for long-term (current) use of medications She will have BMP at today's visit in follow-up of long-term use of medications for the treatment of diabetes, and hypertension. - Basic Metabolic Panel   An After Visit Summary was  printed and given to the patient.  Follow-up: Return in about 4 months (around 12/26/2019) for DM/HTN/lipids-  sooner if needed.    Antony Blackbird, MD

## 2019-08-29 LAB — MICROALBUMIN / CREATININE URINE RATIO
Creatinine, Urine: 44.4 mg/dL
Microalb/Creat Ratio: 45 mg/g{creat} — ABNORMAL HIGH (ref 0–29)
Microalbumin, Urine: 20 ug/mL

## 2019-08-29 LAB — BASIC METABOLIC PANEL WITH GFR
BUN/Creatinine Ratio: 18 (ref 12–28)
BUN: 21 mg/dL (ref 8–27)
CO2: 23 mmol/L (ref 20–29)
Calcium: 9.7 mg/dL (ref 8.7–10.3)
Chloride: 106 mmol/L (ref 96–106)
Creatinine, Ser: 1.19 mg/dL — ABNORMAL HIGH (ref 0.57–1.00)
GFR calc Af Amer: 52 mL/min/1.73 — ABNORMAL LOW
GFR calc non Af Amer: 45 mL/min/1.73 — ABNORMAL LOW
Glucose: 97 mg/dL (ref 65–99)
Potassium: 3.9 mmol/L (ref 3.5–5.2)
Sodium: 143 mmol/L (ref 134–144)

## 2019-09-07 ENCOUNTER — Other Ambulatory Visit: Payer: Self-pay | Admitting: Family Medicine

## 2019-09-07 DIAGNOSIS — E1141 Type 2 diabetes mellitus with diabetic mononeuropathy: Secondary | ICD-10-CM

## 2019-09-16 IMAGING — MR MR MRA NECK WO/W CM
13 of 15 series · 36 of 48 positions shown · IV contrast (gadavist)
Comparison: Head CT at 4114 hours.

ADDENDUM:
Study discussed by telephone with Dr. Salih on 05/15/2018 at 9290
hours.
CLINICAL DATA: 72-year-old female with left PCA infarct on code
stroke head CT today performed for right side weakness, numbness,
slurred speech. By report, visual symptoms began 5 days ago.



[Series 5: DWI · axial · 4.0mm · 0.88mm/px · z∈[-114,+26]mm · 4 of 80 slices shown (1 of 4)]
[im 1/80]
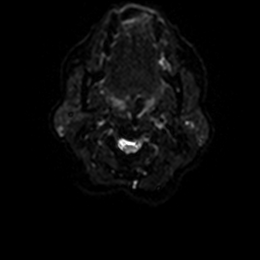
[im 27/80]
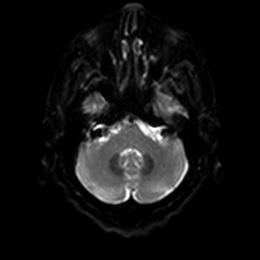
[im 53/80]
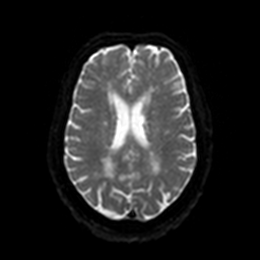
[im 80/80]
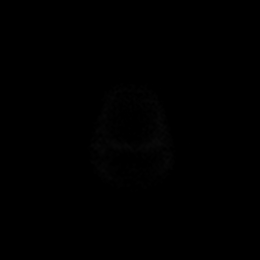

[Series 6: DWI · axial · 4.0mm · 0.88mm/px · z∈[-114,+26]mm · 2 of 40 slices shown (2 of 4)]
[im 1/40]
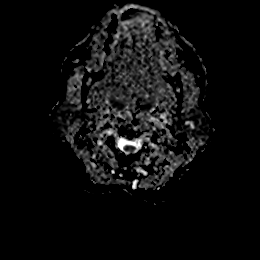
[im 40/40]
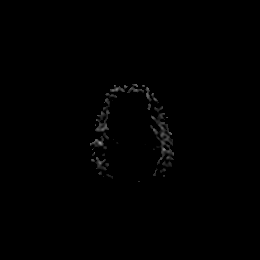

[Series 7: DWI · coronal · 4.0mm · 0.88mm/px · 3 of 64 slices shown (3 of 4)]
[im 1/64]
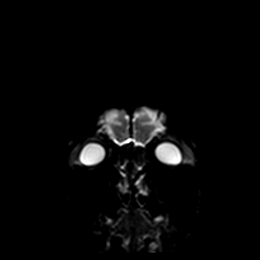
[im 32/64]
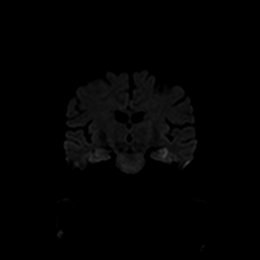
[im 64/64]
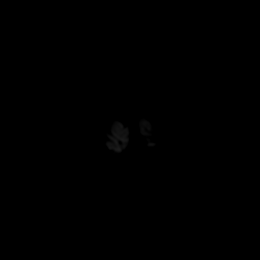

[Series 8: DWI · coronal · 4.0mm · 0.88mm/px · 2 of 32 slices shown (4 of 4)]
[im 1/32]
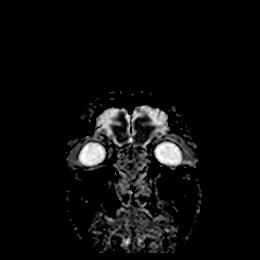
[im 32/32]
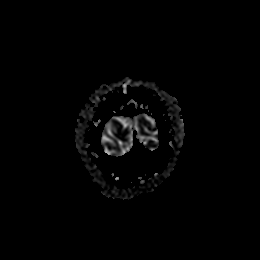

[Series 13: T1 · sagittal · 5.0mm · 0.75mm/px · 1 of 23 slices shown]
[im 1/23]
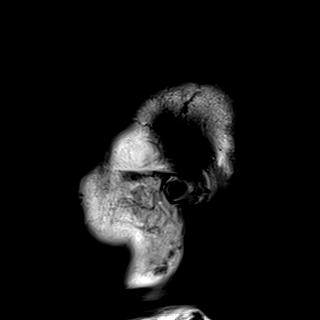

[Series 14: T2 · axial · 5.0mm · 0.72mm/px · 1 of 25 slices shown]
[im 1/25]
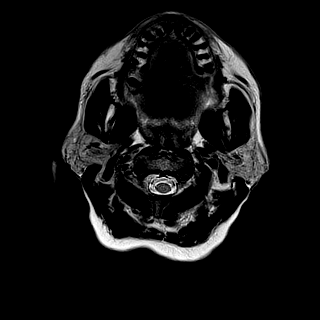

[Series 15: FLAIR · axial · 5.0mm · 0.45mm/px · 1 of 25 slices shown]
[im 1/25]
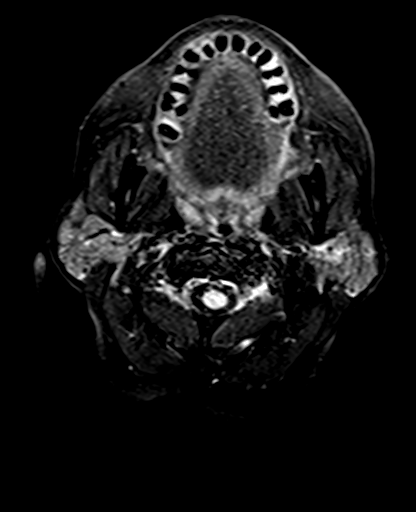

[Series 16: swi_images · axial · 3.0mm · 0.90mm/px · z∈[-111,+49]mm · 3 of 60 slices shown]
[im 1/60]
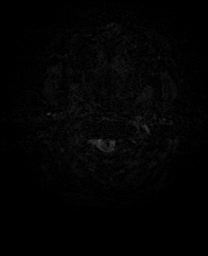
[im 30/60]
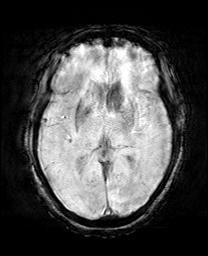
[im 60/60]
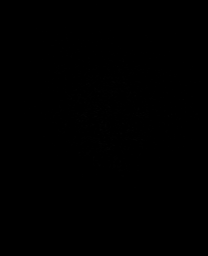

[Series 17: mip_images(sw) · axial · 24.0mm · 0.90mm/px · z∈[-101,+39]mm · 3 of 53 slices shown]
[im 1/53]
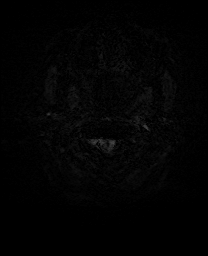
[im 27/53]
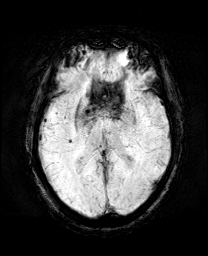
[im 53/53]
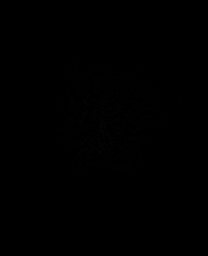

[Series 19: T2 post-contrast · coronal · 5.0mm · 0.72mm/px · 1 of 28 slices shown]
[im 1/28]
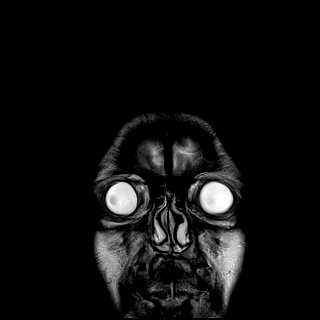

[Series 30: tof_fl3d_tra_iso · axial · 0.6mm · 0.52mm/px · z∈[-136,-60]mm · 7 of 133 slices shown]
[im 1/133]
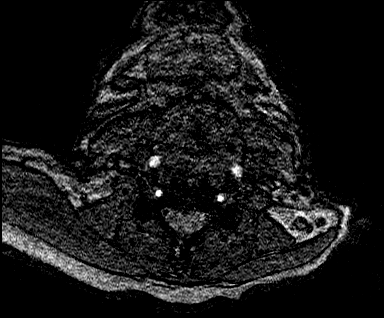
[im 23/133]
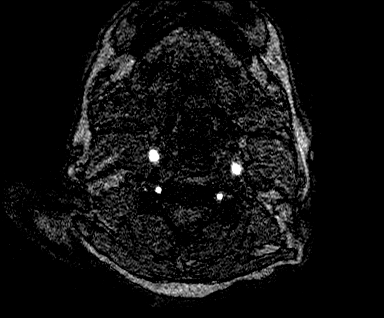
[im 45/133]
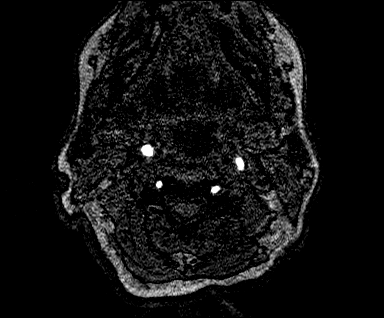
[im 67/133]
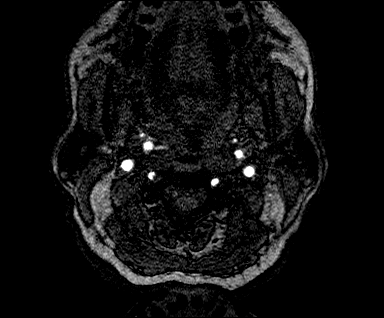
[im 89/133]
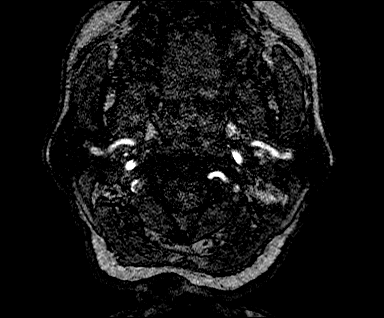
[im 111/133]
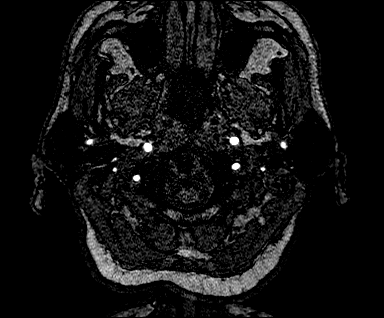
[im 133/133]
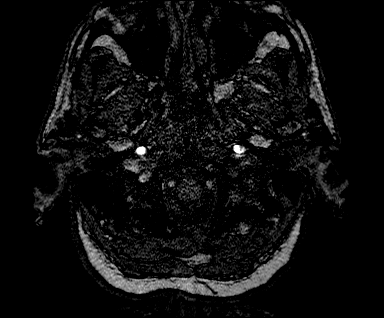

[Series 35: angio_fl3d_cor_pre_ttc=3.0s · coronal · 0.9mm · 0.85mm/px · 4 of 80 slices shown]
[im 1/80]
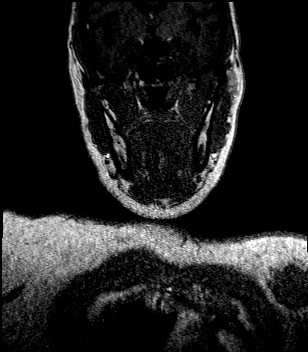
[im 27/80]
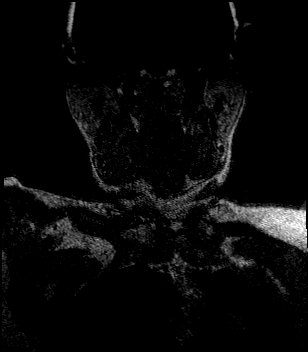
[im 53/80]
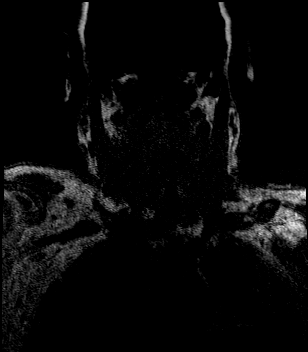
[im 80/80]
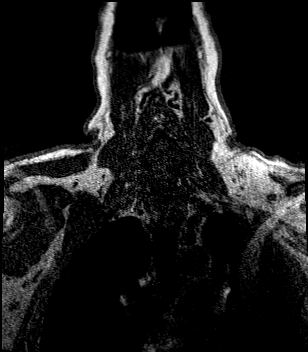

[Series 37: angio_fl3d_cor_post_ttc=3.0s · coronal · 0.9mm · 0.85mm/px · 4 of 80 slices shown]
[im 1/80]
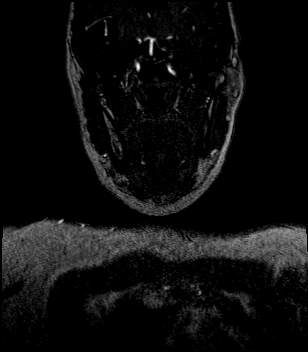
[im 27/80]
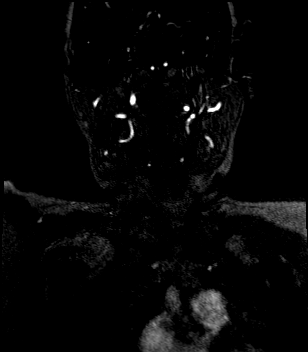
[im 53/80]
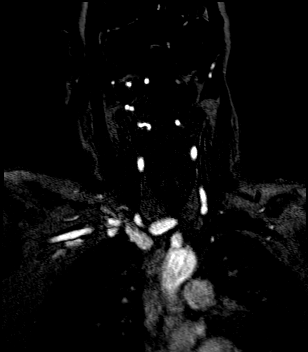
[im 80/80]
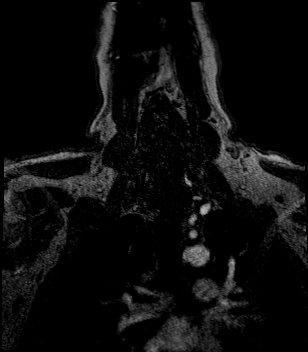

[36 of 48 positions shown; findings below may reference images not displayed]

FINDINGS: MRI HEAD FINDINGS

Brain: Patchy restricted diffusion in the left PCA territory from
the left hippocampus through to the medial left occipital pole
(series 5 images 59 and 62. Associated cytotoxic edema with T2 and
FLAIR hyperintensity but no mass effect. No convincing associated
acute hemorrhage, although there are possibly occasional chronic
micro hemorrhages in the brain (right posterior temporal lobe or
insula on series 16, image 31.

No other restricted diffusion. No midline shift, mass effect,
evidence of mass lesion, ventriculomegaly, extra-axial collection or
acute intracranial hemorrhage. Cervicomedullary junction and
pituitary are within normal limits.

Patchy bilateral cerebral white matter T2 and FLAIR hyperintensity
but no chronic cortical encephalomalacia. There is faint T2 and
FLAIR hyperintensity in the left thalamus although no restricted
diffusion. This is superimposed on a chronic left thalamic lacune on
series 14, image 15. Mild patchy T2 hyperintensity in the pons.
Negative cerebellum.

Vascular: Major intracranial vascular flow voids are preserved.

Skull and upper cervical spine: Negative for age, C5-C6 chronic disc
and endplate degeneration. Visualized bone marrow signal is within
normal limits.

Sinuses/Orbits: Negative orbit soft tissues. Paranasal sinuses and
mastoids are stable and well pneumatized.

Other: Visible internal auditory structures appear normal. Scalp and
face soft tissues appear negative.

MRA NECK FINDINGS

Precontrast time-of-flight images demonstrate antegrade flow in both
cervical carotid and vertebral arteries to the skull base. There is
irregularity at the left carotid bifurcation.

Post-contrast neck MRA images reveal a 3 vessel arch configuration.
The proximal common carotid arteries are tortuous. No great vessel
origin stenosis.

No right CCA stenosis. Right carotid bifurcation is patent without
stenosis. Negative cervical right ICA.

Tortuous left CCA without stenosis. At the left carotid bifurcation
there is irregularity at the left ICA origin with stenosis estimated
at 50 % with respect to the distal vessel. Note is made of moderate
left ICA horizontal petrous segment stenosis on series 4446, image
6.

The vertebral artery origins are within normal limits. The vertebral
arteries are codominant and tortuous in the neck with no convincing
stenosis.

MRA HEAD FINDINGS

Codominant distal vertebral arteries with antegrade flow to the
basilar. Distal vertebral irregularity but no significant stenosis.
The left PICA is patent. The right AICA appears dominant.

The vertebrobasilar junction is fenestrated, normal variant. There
is moderate mid basilar irregularity and stenosis, also evident on
the post-contrast neck MRA images. The distal basilar is patent. SCA
and right PCA origins are patent. The left PCA is occluded from the
mid P1 through the proximal P2 segment with reconstituted flow
signal distally (series 2393, image 8). There is mild stenosis of
the right PCA distal P1. Posterior communicating arteries are
diminutive or absent.

Antegrade flow in both ICA siphons. Moderate to severe stenosis in
the left ICA or is on ule petrous segment corroborated with the Neck
MRA images. A moderate degree of stenosis is seen at the proximal
right ICA cavernous segment (series 9, image 78). The distal ICA
siphons and ophthalmic artery origins are patent. Normal MCA and ACA
origins. Diminutive or absent anterior communicating artery. Visible
ACA branches are within normal limits. Both MCA bifurcations are
patent without stenosis. Visible left MCA branches are within normal
limits. There is mild irregularity of the visible right MCA
branches.
IMPRESSION: 1. Positive for segmental Left PCA occlusion with acute to subacute
patchy Left PCA infarct.
Territory from the left hippocampus through the medial left
occipital pole is affected with edema but no associated hemorrhage
or mass effect.
2. Neck MRA is negative for hemodynamically significant stenosis in
the neck (50% Left ICA origin stenosis), but Head MRA is positive
for:
- moderate to severe Left ICA petrous segment and moderate Right ICA
cavernous segment stenoses.
- moderate mid Basilar Artery stenosis.
3. Chronic small vessel ischemia in the left thalamus. Mild to
moderate for age cerebral white matter signal changes also probably
small vessel disease related.

## 2019-09-24 ENCOUNTER — Ambulatory Visit (INDEPENDENT_AMBULATORY_CARE_PROVIDER_SITE_OTHER): Payer: Medicare HMO | Admitting: *Deleted

## 2019-09-24 DIAGNOSIS — I6300 Cerebral infarction due to thrombosis of unspecified precerebral artery: Secondary | ICD-10-CM

## 2019-09-24 LAB — CUP PACEART REMOTE DEVICE CHECK
Date Time Interrogation Session: 20210225000242
Implantable Pulse Generator Implant Date: 20191022

## 2019-09-24 NOTE — Progress Notes (Signed)
ILR Remote 

## 2019-10-04 ENCOUNTER — Encounter: Payer: Self-pay | Admitting: Family Medicine

## 2019-10-26 ENCOUNTER — Ambulatory Visit (INDEPENDENT_AMBULATORY_CARE_PROVIDER_SITE_OTHER): Payer: Medicare HMO | Admitting: *Deleted

## 2019-10-26 DIAGNOSIS — I6300 Cerebral infarction due to thrombosis of unspecified precerebral artery: Secondary | ICD-10-CM

## 2019-10-26 LAB — CUP PACEART REMOTE DEVICE CHECK
Date Time Interrogation Session: 20210328023141
Implantable Pulse Generator Implant Date: 20191022

## 2019-10-27 NOTE — Progress Notes (Signed)
ILR Remote 

## 2019-11-26 LAB — CUP PACEART REMOTE DEVICE CHECK
Date Time Interrogation Session: 20210428233231
Implantable Pulse Generator Implant Date: 20191022

## 2019-11-30 ENCOUNTER — Ambulatory Visit (INDEPENDENT_AMBULATORY_CARE_PROVIDER_SITE_OTHER): Payer: Medicare HMO | Admitting: *Deleted

## 2019-11-30 DIAGNOSIS — I6300 Cerebral infarction due to thrombosis of unspecified precerebral artery: Secondary | ICD-10-CM

## 2019-12-01 NOTE — Progress Notes (Signed)
[facility] Summary Report / Loop Recorder 

## 2019-12-28 LAB — CUP PACEART REMOTE DEVICE CHECK
Date Time Interrogation Session: 20210529233446
Implantable Pulse Generator Implant Date: 20191022

## 2019-12-29 ENCOUNTER — Ambulatory Visit (INDEPENDENT_AMBULATORY_CARE_PROVIDER_SITE_OTHER): Payer: Medicare HMO | Admitting: *Deleted

## 2019-12-29 DIAGNOSIS — I6300 Cerebral infarction due to thrombosis of unspecified precerebral artery: Secondary | ICD-10-CM

## 2019-12-30 NOTE — Progress Notes (Signed)
[facility] Summary Report / Loop Recorder 

## 2020-01-29 ENCOUNTER — Ambulatory Visit (INDEPENDENT_AMBULATORY_CARE_PROVIDER_SITE_OTHER): Payer: Medicare HMO | Admitting: *Deleted

## 2020-01-29 DIAGNOSIS — I6300 Cerebral infarction due to thrombosis of unspecified precerebral artery: Secondary | ICD-10-CM | POA: Diagnosis not present

## 2020-01-29 LAB — CUP PACEART REMOTE DEVICE CHECK
Date Time Interrogation Session: 20210701230355
Implantable Pulse Generator Implant Date: 20191022

## 2020-02-02 NOTE — Progress Notes (Signed)
[facility] Summary Report / Loop Recorder 

## 2020-03-05 LAB — CUP PACEART REMOTE DEVICE CHECK
Date Time Interrogation Session: 20210803231625
Implantable Pulse Generator Implant Date: 20191022

## 2020-03-07 ENCOUNTER — Ambulatory Visit (INDEPENDENT_AMBULATORY_CARE_PROVIDER_SITE_OTHER): Payer: Medicare HMO | Admitting: *Deleted

## 2020-03-07 DIAGNOSIS — I6300 Cerebral infarction due to thrombosis of unspecified precerebral artery: Secondary | ICD-10-CM | POA: Diagnosis not present

## 2020-03-08 NOTE — Progress Notes (Signed)
[facility] Summary Report / Loop Recorder 

## 2020-03-15 DIAGNOSIS — H524 Presbyopia: Secondary | ICD-10-CM | POA: Diagnosis not present

## 2020-03-15 DIAGNOSIS — H25813 Combined forms of age-related cataract, bilateral: Secondary | ICD-10-CM | POA: Diagnosis not present

## 2020-03-15 DIAGNOSIS — E1136 Type 2 diabetes mellitus with diabetic cataract: Secondary | ICD-10-CM | POA: Diagnosis not present

## 2020-03-15 DIAGNOSIS — H52203 Unspecified astigmatism, bilateral: Secondary | ICD-10-CM | POA: Diagnosis not present

## 2020-03-15 DIAGNOSIS — H5203 Hypermetropia, bilateral: Secondary | ICD-10-CM | POA: Diagnosis not present

## 2020-03-15 DIAGNOSIS — Z7984 Long term (current) use of oral hypoglycemic drugs: Secondary | ICD-10-CM | POA: Diagnosis not present

## 2020-03-30 DIAGNOSIS — H2511 Age-related nuclear cataract, right eye: Secondary | ICD-10-CM | POA: Diagnosis not present

## 2020-03-30 DIAGNOSIS — H25813 Combined forms of age-related cataract, bilateral: Secondary | ICD-10-CM | POA: Diagnosis not present

## 2020-03-30 DIAGNOSIS — H25011 Cortical age-related cataract, right eye: Secondary | ICD-10-CM | POA: Diagnosis not present

## 2020-03-30 HISTORY — PX: EYE SURGERY: SHX253

## 2020-04-06 DIAGNOSIS — H2512 Age-related nuclear cataract, left eye: Secondary | ICD-10-CM | POA: Diagnosis not present

## 2020-04-06 DIAGNOSIS — H25012 Cortical age-related cataract, left eye: Secondary | ICD-10-CM | POA: Diagnosis not present

## 2020-04-06 HISTORY — PX: EYE SURGERY: SHX253

## 2020-04-06 LAB — CUP PACEART REMOTE DEVICE CHECK
Date Time Interrogation Session: 20210905233448
Implantable Pulse Generator Implant Date: 20191022

## 2020-04-11 ENCOUNTER — Other Ambulatory Visit: Payer: Self-pay

## 2020-04-11 ENCOUNTER — Ambulatory Visit: Payer: Medicare HMO | Attending: Family | Admitting: Family

## 2020-04-11 ENCOUNTER — Encounter: Payer: Self-pay | Admitting: Family

## 2020-04-11 ENCOUNTER — Ambulatory Visit (INDEPENDENT_AMBULATORY_CARE_PROVIDER_SITE_OTHER): Payer: Medicare HMO | Admitting: *Deleted

## 2020-04-11 VITALS — BP 148/61 | HR 70 | Temp 97.5°F | Resp 18 | Ht 62.0 in | Wt 158.6 lb

## 2020-04-11 DIAGNOSIS — E119 Type 2 diabetes mellitus without complications: Secondary | ICD-10-CM | POA: Diagnosis not present

## 2020-04-11 DIAGNOSIS — E1141 Type 2 diabetes mellitus with diabetic mononeuropathy: Secondary | ICD-10-CM

## 2020-04-11 DIAGNOSIS — I672 Cerebral atherosclerosis: Secondary | ICD-10-CM

## 2020-04-11 DIAGNOSIS — Z0000 Encounter for general adult medical examination without abnormal findings: Secondary | ICD-10-CM | POA: Diagnosis not present

## 2020-04-11 DIAGNOSIS — Z23 Encounter for immunization: Secondary | ICD-10-CM

## 2020-04-11 DIAGNOSIS — I10 Essential (primary) hypertension: Secondary | ICD-10-CM | POA: Diagnosis not present

## 2020-04-11 DIAGNOSIS — Z8673 Personal history of transient ischemic attack (TIA), and cerebral infarction without residual deficits: Secondary | ICD-10-CM | POA: Diagnosis not present

## 2020-04-11 DIAGNOSIS — I6300 Cerebral infarction due to thrombosis of unspecified precerebral artery: Secondary | ICD-10-CM

## 2020-04-11 LAB — POCT GLYCOSYLATED HEMOGLOBIN (HGB A1C): Hemoglobin A1C: 6.4 % — AB (ref 4.0–5.6)

## 2020-04-11 LAB — GLUCOSE, POCT (MANUAL RESULT ENTRY): POC Glucose: 89 mg/dl (ref 70–99)

## 2020-04-11 MED ORDER — AMLODIPINE BESYLATE 10 MG PO TABS: 10.0000 mg | ORAL_TABLET | Freq: Every day | ORAL | 0 refills | Status: DC

## 2020-04-11 MED ORDER — ATORVASTATIN CALCIUM 80 MG PO TABS: ORAL_TABLET | ORAL | 0 refills | Status: DC

## 2020-04-11 MED ORDER — GABAPENTIN 100 MG PO CAPS: 100.0000 mg | ORAL_CAPSULE | Freq: Three times a day (TID) | ORAL | 0 refills | Status: DC

## 2020-04-11 MED ORDER — ACCU-CHEK FASTCLIX LANCETS MISC: 12 refills | Status: DC

## 2020-04-11 MED ORDER — HYDROCHLOROTHIAZIDE 12.5 MG PO TABS: 12.5000 mg | ORAL_TABLET | Freq: Every day | ORAL | 0 refills | Status: DC

## 2020-04-11 MED ORDER — CARVEDILOL 25 MG PO TABS: 25.0000 mg | ORAL_TABLET | Freq: Two times a day (BID) | ORAL | 2 refills | Status: DC

## 2020-04-11 MED ORDER — VALSARTAN 320 MG PO TABS: 320.0000 mg | ORAL_TABLET | Freq: Every day | ORAL | 0 refills | Status: DC

## 2020-04-11 MED ORDER — JANUMET 50-500 MG PO TABS: ORAL_TABLET | ORAL | 0 refills | Status: DC

## 2020-04-11 MED ORDER — ACCU-CHEK GUIDE VI STRP: ORAL_STRIP | 11 refills | Status: DC

## 2020-04-11 NOTE — Patient Instructions (Addendum)
Continue Janumet for diabetes. Continue Amlodipine, Hydrochlorothiazide, and Carvedilol for high blood pressure. Continue Atorvastatin for cholesterol. Lab today. Follow-up with primary physician in 3 months or sooner if needed. Diabetes Basics  Diabetes (diabetes mellitus) is a long-term (chronic) disease. It occurs when the body does not properly use sugar (glucose) that is released from food after you eat. Diabetes may be caused by one or both of these problems:  Your pancreas does not make enough of a hormone called insulin.  Your body does not react in a normal way to insulin that it makes. Insulin lets sugars (glucose) go into cells in your body. This gives you energy. If you have diabetes, sugars cannot get into cells. This causes high blood sugar (hyperglycemia). Follow these instructions at home: How is diabetes treated? You may need to take insulin or other diabetes medicines daily to keep your blood sugar in balance. Take your diabetes medicines every day as told by your doctor. List your diabetes medicines here: Diabetes medicines  Name of medicine: ______________________________ ? Amount (dose): _______________ Time (a.m./p.m.): _______________ Notes: ___________________________________  Name of medicine: ______________________________ ? Amount (dose): _______________ Time (a.m./p.m.): _______________ Notes: ___________________________________  Name of medicine: ______________________________ ? Amount (dose): _______________ Time (a.m./p.m.): _______________ Notes: ___________________________________ If you use insulin, you will learn how to give yourself insulin by injection. You may need to adjust the amount based on the food that you eat. List the types of insulin you use here: Insulin  Insulin type: ______________________________ ? Amount (dose): _______________ Time (a.m./p.m.): _______________ Notes: ___________________________________  Insulin type:  ______________________________ ? Amount (dose): _______________ Time (a.m./p.m.): _______________ Notes: ___________________________________  Insulin type: ______________________________ ? Amount (dose): _______________ Time (a.m./p.m.): _______________ Notes: ___________________________________  Insulin type: ______________________________ ? Amount (dose): _______________ Time (a.m./p.m.): _______________ Notes: ___________________________________  Insulin type: ______________________________ ? Amount (dose): _______________ Time (a.m./p.m.): _______________ Notes: ___________________________________ How do I manage my blood sugar?  Check your blood sugar levels using a blood glucose monitor as directed by your doctor. Your doctor will set treatment goals for you. Generally, you should have these blood sugar levels:  Before meals (preprandial): 80-130 mg/dL (4.4-7.2 mmol/L).  After meals (postprandial): below 180 mg/dL (10 mmol/L).  A1c level: less than 7%. Write down the times that you will check your blood sugar levels: Blood sugar checks  Time: _______________ Notes: ___________________________________  Time: _______________ Notes: ___________________________________  Time: _______________ Notes: ___________________________________  Time: _______________ Notes: ___________________________________  Time: _______________ Notes: ___________________________________  Time: _______________ Notes: ___________________________________  What do I need to know about low blood sugar? Low blood sugar is called hypoglycemia. This is when blood sugar is at or below 70 mg/dL (3.9 mmol/L). Symptoms may include:  Feeling: ? Hungry. ? Worried or nervous (anxious). ? Sweaty and clammy. ? Confused. ? Dizzy. ? Sleepy. ? Sick to your stomach (nauseous).  Having: ? A fast heartbeat. ? A headache. ? A change in your vision. ? Tingling or no feeling (numbness) around the mouth, lips, or  tongue. ? Jerky movements that you cannot control (seizure).  Having trouble with: ? Moving (coordination). ? Sleeping. ? Passing out (fainting). ? Getting upset easily (irritability). Treating low blood sugar To treat low blood sugar, eat or drink something sugary right away. If you can think clearly and swallow safely, follow the 15:15 rule:  Take 15 grams of a fast-acting carb (carbohydrate). Talk with your doctor about how much you should take.  Some fast-acting carbs are: ? Sugar tablets (glucose pills). Take 3-4 glucose pills. ? 6-8 pieces  of hard candy. ? 4-6 oz (120-150 mL) of fruit juice. ? 4-6 oz (120-150 mL) of regular (not diet) soda. ? 1 Tbsp (15 mL) honey or sugar.  Check your blood sugar 15 minutes after you take the carb.  If your blood sugar is still at or below 70 mg/dL (3.9 mmol/L), take 15 grams of a carb again.  If your blood sugar does not go above 70 mg/dL (3.9 mmol/L) after 3 tries, get help right away.  After your blood sugar goes back to normal, eat a meal or a snack within 1 hour. Treating very low blood sugar If your blood sugar is at or below 54 mg/dL (3 mmol/L), you have very low blood sugar (severe hypoglycemia). This is an emergency. Do not wait to see if the symptoms will go away. Get medical help right away. Call your local emergency services (911 in the [place] Do not drive yourself to the hospital. Questions to ask your health care provider  Do I need to meet with a diabetes educator?  What equipment will I need to care for myself at home?  What diabetes medicines do I need? When should I take them?  How often do I need to check my blood sugar?  What number can I call if I have questions?  When is my next doctor's visit?  Where can I find a support group for people with diabetes? Where to find more information  American Diabetes Association: www.diabetes.org  American Association of Diabetes Educators:  www.diabeteseducator.org/patient-resources Contact a doctor if:  Your blood sugar is at or above 240 mg/dL (13.3 mmol/L) for 2 days in a row.  You have been sick or have had a fever for 2 days or more, and you are not getting better.  You have any of these problems for more than 6 hours: ? You cannot eat or drink. ? You feel sick to your stomach (nauseous). ? You throw up (vomit). ? You have watery poop (diarrhea). Get help right away if:  Your blood sugar is lower than 54 mg/dL (3 mmol/L).  You get confused.  You have trouble: ? Thinking clearly. ? Breathing. Summary  Diabetes (diabetes mellitus) is a long-term (chronic) disease. It occurs when the body does not properly use sugar (glucose) that is released from food after digestion.  Take insulin and diabetes medicines as told.  Check your blood sugar every day, as often as told.  Keep all follow-up visits as told by your doctor. This is important. This information is not intended to replace advice given to you by your health care provider. Make sure you discuss any questions you have with your health care provider. Document Revised: 04/08/2019 Document Reviewed: 10/18/2017 Elsevier Patient Education  [place].

## 2020-04-11 NOTE — Progress Notes (Signed)
Patient ID: Heather Rogers, female    DOB: [birth year]  MRN: [id number]  CC: Physical Examination  Subjective: Heather Rogers is a 74 y.o. female with history of stroke and spastic hemiplegia of right dominant side as late effect of cerebral infarction who presents for physical examination and accompanied with her daughter [name].  1. DIABETES TYPE 2 FOLLOW-UP: 08/28/2019: Encounter with Dr. Chapman Fitch. Patient reported blood sugars were well controlled. Continued on heart healthy diet and continue to monitor blood sugars. Continued on Janumet. CBG, hemoglobin A1C, BMP, and microalbumon/creatinine urine ratio obtained.  04/11/2020:    Last A1C: 6.4% on 04/11/2020 Are you fasting today: []  Yes [x]  No oatmeal with raisins/dates, water   Have you taken your anti-diabetic medications today:[x]  No, scheduled to take in the evening/night  Med Adherence:  [x]  Yes    []  No Medication side effects:  []  Yes    [x]  No Home Monitoring?  [x]  Yes    []  No Home glucose results range:  114, 113, 108, 105, 102, 120 (morning fasting), does not usually take evening/night blood sugar Diet Adherence: [x]  Yes    []  No  Exercise: []  Yes    [x]  No Hypoglycemic episodes?: []  Yes    [x]  No Numbness of the feet? []  Yes    [x]  No Last eye exam: Removal of right eye cataract on  03/30/2020. Removal left eye cataract on 04/06/2020. Scheduled to return on 04/25/2020 for check-up of both eyes.  2. HYPERTENSION FOLLOW-UP: 08/28/2019: Encounter with Dr. Chapman Fitch. Blood pressure elevated and on recheck was closer to normal level. Per home blood pressure diary, blood pressures controlled. Continued on current medications (Amlodipine, Carvedilol, Valsartan, and Hydrochlorothiazide), low-sodium diet, and encouraged to remain active with low impact exercise program such as walking. BMP obtained.  04/11/2020: Currently taking: see medication list Have you taken your blood pressure medication today: [x]  Yes []  No  Med Adherence: [x]  Yes     []  No Medication side effects: []  Yes    [x]  No Adherence with salt restriction: [x]  Yes    []  No Exercise: Yes []  No [x]  Home Monitoring?: [x]  Yes    []  No Monitoring Frequency: [x]  Yes    []  No  Home BP results range: [x]  Yes, 110's - 130's/60's, has occasional blood pressure 143-148/60s, reports earlier this morning prior to this visit blood pressure was 128/65 Smoking []  Yes [x]  No SOB? [x]  Yes, sometimes  Chest Pain?: []  Yes    [x]  No Leg swelling?: []  Yes    [x]  No Headaches?: []  Yes    [x]  No Dizziness? []  Yes    [x]  No  3. STROKE FOLLOW-UP: 08/28/2019: Encounter with Dr. Chapman Fitch. Patient was currently in a research study where she is either receiving aspirin or low-dose Eliquis for anticoagulation. Continue to monitor blood pressure with goal of 140/90 or less and continue low-sodium diet and continue compliance with medication. Continued on Atorvastatin 80 mg/daily.  04/11/2020: Today reports still participating in research study, last time seen July 2021, reports still taking medication, reports they check her blood pressure during those visits. Reports she is still taking cholesterol medication.  Patient Active Problem List   Diagnosis Date Noted  . Spastic hemiplegia of right dominant side as late effect of cerebral infarction ([place]) 11/22/2018  . Stroke ([place]) 05/15/2018     Current Outpatient Medications on File Prior to Visit  Medication Sig Dispense Refill  . ACCU-CHEK FASTCLIX LANCETS MISC Check blood sugar fasting and before meals and again  if pt feels bad (symptoms of hypo). 100 each 12  . ACCU-CHEK GUIDE test strip CHECK BLOOD SUGAR FASTING AND BEFORE MEALS AND AGAIN IF PATIENT FEELS BAD (SYMPTOMS OF HYPO). 100 strip 11  . allopurinol (ZYLOPRIM) 100 MG tablet Take 1 tablet (100 mg total) by mouth daily. 30 tablet 0  . amLODipine (NORVASC) 10 MG tablet Take 1 tablet (10 mg total) by mouth daily. To lower blood pressure 90 tablet 3  . atorvastatin (LIPITOR) 80 MG tablet  One pill by mouth in the evening to lower cholesterol 90 tablet 4  . Blood Glucose Monitoring Suppl (ACCU-CHEK GUIDE) w/Device KIT 1 each by Does not apply route as directed. 1 kit 0  . carvedilol (COREG) 25 MG tablet Take 1 tablet (25 mg total) by mouth 2 (two) times daily with a meal. 60 tablet 11  . gabapentin (NEURONTIN) 100 MG capsule Take 1 capsule (100 mg total) by mouth 3 (three) times daily. 270 capsule 3  . hydrochlorothiazide (HYDRODIURIL) 12.5 MG tablet Take 1 tablet (12.5 mg total) by mouth daily. 90 tablet 3  . sitaGLIPtin-metformin (JANUMET) 50-500 MG tablet Once daily with biggest meal of the day 90 tablet 3  . Study - [facility] - apixaban 2.10m or Placebo Take 2.5 mg by mouth 2 (two) times daily.    . Study - [place] - aspirin 826mor Placebo Take 81 mg by mouth daily.    . valsartan (DIOVAN) 320 MG tablet Take 1 tablet (320 mg total) by mouth daily. To lower blood pressure 90 tablet 3   No current facility-administered medications on file prior to visit.    Allergies  Allergen Reactions  . Sulfa Antibiotics     Itching     Social History   Socioeconomic History  . Marital status: Divorced    Spouse name: Not on file  . Number of children: Not on file  . Years of education: Not on file  . Highest education level: Not on file  Occupational History  . Not on file  Tobacco Use  . Smoking status: Former SmResearch scientist (life sciences). Smokeless tobacco: Never Used  Substance and Sexual Activity  . Alcohol use: Never  . Drug use: Never  . Sexual activity: Not Currently  Other Topics Concern  . Not on file  Social History Narrative   [place]  Social Determinants of Health   Financial Resource Strain:   . Difficulty of Paying Living Expenses: Not on file  Food Insecurity:   . Worried About RuCharity fundraisern the Last Year: Not on file  . Ran Out of Food in the Last Year: Not on file  Transportation Needs:   . Lack of Transportation (Medical): Not on file  . Lack  of Transportation (Non-Medical): Not on file  Physical Activity:   . Days of Exercise per Week: Not on file  . Minutes of Exercise per Session: Not on file  Stress:   . Feeling of Stress : Not on file  Social Connections:   . Frequency of Communication with Friends and Family: Not on file  . Frequency of Social Gatherings with Friends and Family: Not on file  . Attends Religious Services: Not on file  . Active Member of Clubs or Organizations: Not on file  . Attends ClArchivisteetings: Not on file  . Marital Status: Not on file  Intimate Partner Violence:   . Fear of Current or Ex-Partner: Not on file  . Emotionally Abused: Not  on file  . Physically Abused: Not on file  . Sexually Abused: Not on file    Family History  Problem Relation Age of Onset  . Hypertension Mother   . Stroke Mother   . Hypertension Father     Past Surgical History:  Procedure Laterality Date  . hysterectomy    . LOOP RECORDER INSERTION N/A 05/20/2018   Procedure: LOOP RECORDER INSERTION;  Surgeon: Thompson Grayer, MD;  Location: [facility];  Service: Cardiovascular;  Laterality: N/A;  . TEE WITHOUT CARDIOVERSION N/A 05/20/2018   Procedure: TRANSESOPHAGEAL ECHOCARDIOGRAM (TEE);  Surgeon: Sueanne Margarita, MD;  Location: [facility];  Service: Cardiovascular;  Laterality: N/A;    ROS: Review of Systems Negative except as stated above  PHYSICAL EXAM: Vitals with BMI 04/11/2020 04/11/2020 04/11/2020  Height - - 5' 2"   Weight - - 158 lbs 10 oz  BMI - - 29  Systolic 923 300 762  Diastolic 61 77 77  Pulse - 70 65   Wt Readings from Last 3 Encounters:  04/11/20 158 lb 9.6 oz (71.9 kg)  08/28/19 160 lb (72.6 kg)  07/28/19 160 lb 12.8 oz (72.9 kg)   Physical Exam General appearance - alert, well appearing, and in no distress and oriented to person, place, and time Mental status - alert, oriented to person, place, and time, normal mood, behavior, speech, dress, motor activity, and  thought processes Eyes - pupils equal and reactive, extraocular eye movements intact Neck - supple, no significant adenopathy Lymphatics - no palpable lymphadenopathy, no hepatosplenomegaly Chest - clear to auscultation, no wheezes, rales or rhonchi, symmetric air entry, no tachypnea, retractions or cyanosis Heart - normal rate, regular rhythm, normal S1, S2, no murmurs, rubs, clicks or gallops Abdomen - soft, nontender, nondistended, no masses or organomegaly Neurological - alert, oriented, normal speech, no focal findings or movement disorder noted Musculoskeletal - no tenderness or edema; cervical back normal range of motion and neck supple Skin - normal coloration and turgor, no rashes, no suspicious skin lesions noted, patient has on stockings and prefers not to remove them at this time and reports no active skin breakdown on the feet  Results for orders placed or performed in visit on 04/11/20  Glucose (CBG)  Result Value Ref Range   POC Glucose 89 70 - 99 mg/dl  POCT glycosylated hemoglobin (Hb A1C)  Result Value Ref Range   Hemoglobin A1C 6.4 (A) 4.0 - 5.6 %   HbA1c POC (<> result, manual entry)     HbA1c, POC (prediabetic range)     HbA1c, POC (controlled diabetic range)    Results for orders placed or performed in visit on 04/11/20  CUP PACEART REMOTE DEVICE CHECK  Result Value Ref Range   Date Time Interrogation Session [id number]    Pulse Generator Manufacturer MERM    Pulse Gen Model LHT34 Reveal LINQ    Pulse Gen Serial Number [id number]    Clinic Name [facility]    Implantable Pulse Generator Type ICM/ILR    Implantable Pulse Generator Implant Date [id number]    Eval Rhythm SR at 79 bpm     ASSESSMENT AND PLAN: 1. Physical exam: - Patient presents today for a physical examination.  2. Type 2 diabetes mellitus with diabetic mononeuropathy, without long-term current use of insulin ([place]): - Controlled.  - CBG today 89 and non-fasting.  - Hemoglobin A1C today  at goal at 6.4%, goal < 7%.  - Continue Sitagliptin-Metformin as prescribed.  - Continue Gabapentin as  prescribed for diabetic neuropathy.  - Lancets and testing strips refilled. - To achieve an A1C goal of less than or equal to 7.0 percent, a fasting blood sugar of 80 to 130 mg/dL and a postprandial glucose (90 to 120 minutes after a meal) less than 180 mg/dL. In the event of sugars less than 60 mg/dl or greater than 400 mg/dl please notify the clinic ASAP. It is recommended that you undergo annual eye exams and annual foot exams. - Discussed the importance of healthy eating habits, low-carbohydrate diet, low-sugar diet, regular aerobic exercise (at least 150 minutes a week as tolerated) and medication compliance to achieve or maintain control of diabetes. - CMP to check kidney function, liver function, and electrolyte balance.  - CBC to check for anemia. - Follow-up with primary physician in 3 months or sooner if needed. - CMP14+EGFR - CBC With Differential - Glucose (CBG) - POCT glycosylated hemoglobin (Hb A1C) - sitaGLIPtin-metformin (JANUMET) 50-500 MG tablet; Once daily with biggest meal of the day  Dispense: 90 tablet; Refill: 0 - gabapentin (NEURONTIN) 100 MG capsule; Take 1 capsule (100 mg total) by mouth 3 (three) times daily.  Dispense: 270 capsule; Refill: 0 - Accu-Chek FastClix Lancets MISC; Check blood sugar fasting and before meals and again if pt feels bad (symptoms of hypo).  Dispense: 100 each; Refill: 12 - glucose blood (ACCU-CHEK GUIDE) test strip; CHECK BLOOD SUGAR FASTING AND BEFORE MEALS AND AGAIN IF PATIENT FEELS BAD (SYMPTOMS OF HYPO).  Dispense: 100 strip; Refill: 11  3. Essential hypertension: - Blood pressure not at goal during today's visit. Patient asymptomatic without chest pressure, chest pain, palpitations, and shortness of breath. - Today patient has home blood pressure diary with her. Blood pressures at home tend to run  110's-130's/60's and are controlled.  -  Continue Amlodipine, Hydrochlorothiazide, Carvedilol, and Valsartan as prescribed. - Counseled on blood pressure goal of less than 130/80, low-sodium, DASH diet, medication compliance, 150 minutes of moderate intensity exercise per week as tolerated. Discussed medication compliance, adverse effects. - CMP to check kidney function, liver function, and electrolyte balance.  - CBC to check for anemia. - Follow-up with primary physician in 3 months or sooner if needed. - CMP14+EGFR - CBC With Differential - amLODipine (NORVASC) 10 MG tablet; Take 1 tablet (10 mg total) by mouth daily. To lower blood pressure  Dispense: 90 tablet; Refill: 0 - hydrochlorothiazide (HYDRODIURIL) 12.5 MG tablet; Take 1 tablet (12.5 mg total) by mouth daily.  Dispense: 90 tablet; Refill: 0 - carvedilol (COREG) 25 MG tablet; Take 1 tablet (25 mg total) by mouth 2 (two) times daily with a meal.  Dispense: 60 tablet; Refill: 2 - valsartan (DIOVAN) 320 MG tablet; Take 1 tablet (320 mg total) by mouth daily. To lower blood pressure  Dispense: 90 tablet; Refill: 0  4. Status post CVA: 5. Atherosclerotic cerebrovascular disease: - Patient is currently in a research study where she is either receiving Aspirin or low-dose Eliquis for anticoagulation. She should continue to monitor her blood pressure with goal of 140/90 or less and continue low-sodium diet. - Practice low-fat heart healthy diet and exercise weekly as tolerated.  - Continue Atorvastatin as prescribed. - Follow-up with primary physician in 3 months or sooner if needed. - atorvastatin (LIPITOR) 80 MG tablet; One pill by mouth in the evening to lower cholesterol  Dispense: 90 tablet; Refill: 0  6. Need for immunization against influenza: - Influenza vaccine administered during today's visit. - Flu Vaccine QUAD 36+ mos IM  7. Need for 23-polyvalent pneumococcal polysaccharide vaccine: - Pneumonia vaccine administered during today's visit. - Pneumococcal  polysaccharide vaccine 23-valent greater than or equal to 2yo subcutaneous/IM   Patient was given the opportunity to ask questions.  Patient verbalized understanding of the plan and was able to repeat key elements of the plan. Patient was given clear instructions to go to Emergency Department or return to medical center if symptoms don't improve, worsen, or new problems develop.The patient verbalized understanding.  Camillia Herter, NP

## 2020-04-11 NOTE — Progress Notes (Signed)
Medication refills  Interested in flu/pneumo vaccines today  rc'd J&J vaccine on 11/03/19

## 2020-04-12 LAB — CBC WITH DIFFERENTIAL
Basophils Absolute: 0 10*3/uL (ref 0.0–0.2)
Basos: 1 %
EOS (ABSOLUTE): 0.1 10*3/uL (ref 0.0–0.4)
Eos: 1 %
Hematocrit: 35.5 % (ref 34.0–46.6)
Hemoglobin: 11.2 g/dL (ref 11.1–15.9)
Immature Grans (Abs): 0 10*3/uL (ref 0.0–0.1)
Immature Granulocytes: 0 %
Lymphocytes Absolute: 1.9 10*3/uL (ref 0.7–3.1)
Lymphs: 36 %
MCH: 23 pg — ABNORMAL LOW (ref 26.6–33.0)
MCHC: 31.5 g/dL (ref 31.5–35.7)
MCV: 73 fL — ABNORMAL LOW (ref 79–97)
Monocytes Absolute: 0.6 10*3/uL (ref 0.1–0.9)
Monocytes: 11 %
Neutrophils Absolute: 2.8 10*3/uL (ref 1.4–7.0)
Neutrophils: 51 %
RBC: 4.88 x10E6/uL (ref 3.77–5.28)
RDW: 14.4 % (ref 11.7–15.4)
WBC: 5.3 10*3/uL (ref 3.4–10.8)

## 2020-04-12 LAB — CMP14+EGFR
ALT: 14 IU/L (ref 0–32)
AST: 14 IU/L (ref 0–40)
Albumin/Globulin Ratio: 1.3 (ref 1.2–2.2)
Albumin: 4.5 g/dL (ref 3.7–4.7)
Alkaline Phosphatase: 86 IU/L (ref 44–121)
BUN/Creatinine Ratio: 25 (ref 12–28)
BUN: 22 mg/dL (ref 8–27)
Bilirubin Total: 0.3 mg/dL (ref 0.0–1.2)
CO2: 21 mmol/L (ref 20–29)
Calcium: 9.6 mg/dL (ref 8.7–10.3)
Chloride: 104 mmol/L (ref 96–106)
Creatinine, Ser: 0.88 mg/dL (ref 0.57–1.00)
GFR calc Af Amer: 75 mL/min/{1.73_m2} (ref >59)
GFR calc non Af Amer: 65 mL/min/{1.73_m2} (ref >59)
Globulin, Total: 3.4 g/dL (ref 1.5–4.5)
Glucose: 89 mg/dL (ref 65–99)
Potassium: 4.7 mmol/L (ref 3.5–5.2)
Sodium: 141 mmol/L (ref 134–144)
Total Protein: 7.9 g/dL (ref 6.0–8.5)

## 2020-04-12 NOTE — Progress Notes (Signed)
Please call patient with update.   Kidney function normal.   Liver function normal.   No anemia.  Diabetes discussed in office.

## 2020-04-12 NOTE — Progress Notes (Signed)
[facility] Summary Report / Loop Recorder 

## 2020-04-18 ENCOUNTER — Other Ambulatory Visit: Payer: Self-pay | Admitting: Family Medicine

## 2020-04-18 DIAGNOSIS — E1141 Type 2 diabetes mellitus with diabetic mononeuropathy: Secondary | ICD-10-CM

## 2020-05-07 LAB — CUP PACEART REMOTE DEVICE CHECK
Date Time Interrogation Session: 20211008233806
Implantable Pulse Generator Implant Date: 20191022

## 2020-06-20 ENCOUNTER — Ambulatory Visit (INDEPENDENT_AMBULATORY_CARE_PROVIDER_SITE_OTHER): Payer: Medicare HMO

## 2020-06-20 DIAGNOSIS — I6300 Cerebral infarction due to thrombosis of unspecified precerebral artery: Secondary | ICD-10-CM

## 2020-06-20 LAB — CUP PACEART REMOTE DEVICE CHECK
Date Time Interrogation Session: 20211121233634
Implantable Pulse Generator Implant Date: 20191022

## 2020-06-27 NOTE — Progress Notes (Signed)
[facility] Summary Report / Loop Recorder 

## 2020-07-08 ENCOUNTER — Other Ambulatory Visit: Payer: Self-pay | Admitting: Family

## 2020-07-08 DIAGNOSIS — E1141 Type 2 diabetes mellitus with diabetic mononeuropathy: Secondary | ICD-10-CM

## 2020-07-11 ENCOUNTER — Ambulatory Visit: Payer: Medicare HMO | Admitting: Family Medicine

## 2020-07-18 ENCOUNTER — Other Ambulatory Visit: Payer: Self-pay | Admitting: Family Medicine

## 2020-07-18 DIAGNOSIS — E1141 Type 2 diabetes mellitus with diabetic mononeuropathy: Secondary | ICD-10-CM

## 2020-07-18 MED ORDER — GABAPENTIN 100 MG PO CAPS: 100.0000 mg | ORAL_CAPSULE | Freq: Three times a day (TID) | ORAL | 0 refills | Status: DC

## 2020-07-18 NOTE — Telephone Encounter (Signed)
Medication: gabapentin (NEURONTIN) 100 MG capsule [[id number]]  ENDED  Has the patient contacted their pharmacy? YES  (Agent: If no, request that the patient contact the pharmacy for the refill.) (Agent: If yes, when and what did the pharmacy advise?)  Preferred Pharmacy (with phone number or street name): [facility] [address] [place] - [address]  [address], [place] [address]  Phone:  [phone] Fax:  [phone]   Agent: Please be advised that RX refills may take up to 3 business days. We ask that you follow-up with your pharmacy.

## 2020-07-24 ENCOUNTER — Emergency Department (HOSPITAL_COMMUNITY): Payer: Medicare HMO

## 2020-07-24 ENCOUNTER — Other Ambulatory Visit: Payer: Self-pay

## 2020-07-24 ENCOUNTER — Inpatient Hospital Stay (HOSPITAL_COMMUNITY)
Admission: EM | Admit: 2020-07-24 | Discharge: 2020-07-27 | DRG: 177 | Disposition: A | Discharge status: Home Health | Payer: Medicare HMO | Attending: Internal Medicine | Admitting: Internal Medicine

## 2020-07-24 DIAGNOSIS — I69351 Hemiplegia and hemiparesis following cerebral infarction affecting right dominant side: Secondary | ICD-10-CM | POA: Diagnosis not present

## 2020-07-24 DIAGNOSIS — E118 Type 2 diabetes mellitus with unspecified complications: Secondary | ICD-10-CM

## 2020-07-24 DIAGNOSIS — E860 Dehydration: Secondary | ICD-10-CM | POA: Diagnosis present

## 2020-07-24 DIAGNOSIS — I10 Essential (primary) hypertension: Secondary | ICD-10-CM | POA: Diagnosis present

## 2020-07-24 DIAGNOSIS — I69312 Visuospatial deficit and spatial neglect following cerebral infarction: Secondary | ICD-10-CM | POA: Diagnosis not present

## 2020-07-24 DIAGNOSIS — T380X5A Adverse effect of glucocorticoids and synthetic analogues, initial encounter: Secondary | ICD-10-CM | POA: Diagnosis not present

## 2020-07-24 DIAGNOSIS — U071 COVID-19: Principal | ICD-10-CM | POA: Diagnosis present

## 2020-07-24 DIAGNOSIS — Z8673 Personal history of transient ischemic attack (TIA), and cerebral infarction without residual deficits: Secondary | ICD-10-CM

## 2020-07-24 DIAGNOSIS — Z90710 Acquired absence of both cervix and uterus: Secondary | ICD-10-CM | POA: Diagnosis not present

## 2020-07-24 DIAGNOSIS — I69311 Memory deficit following cerebral infarction: Secondary | ICD-10-CM | POA: Diagnosis not present

## 2020-07-24 DIAGNOSIS — J1282 Pneumonia due to coronavirus disease 2019: Secondary | ICD-10-CM | POA: Diagnosis present

## 2020-07-24 DIAGNOSIS — R791 Abnormal coagulation profile: Secondary | ICD-10-CM | POA: Diagnosis present

## 2020-07-24 DIAGNOSIS — Z87891 Personal history of nicotine dependence: Secondary | ICD-10-CM

## 2020-07-24 DIAGNOSIS — N179 Acute kidney failure, unspecified: Secondary | ICD-10-CM | POA: Diagnosis present

## 2020-07-24 DIAGNOSIS — R55 Syncope and collapse: Secondary | ICD-10-CM | POA: Diagnosis present

## 2020-07-24 DIAGNOSIS — I152 Hypertension secondary to endocrine disorders: Secondary | ICD-10-CM | POA: Diagnosis present

## 2020-07-24 DIAGNOSIS — I6930 Unspecified sequelae of cerebral infarction: Secondary | ICD-10-CM | POA: Diagnosis not present

## 2020-07-24 DIAGNOSIS — R42 Dizziness and giddiness: Secondary | ICD-10-CM | POA: Diagnosis not present

## 2020-07-24 DIAGNOSIS — I639 Cerebral infarction, unspecified: Secondary | ICD-10-CM | POA: Diagnosis not present

## 2020-07-24 DIAGNOSIS — Z8249 Family history of ischemic heart disease and other diseases of the circulatory system: Secondary | ICD-10-CM

## 2020-07-24 DIAGNOSIS — D509 Iron deficiency anemia, unspecified: Secondary | ICD-10-CM | POA: Diagnosis present

## 2020-07-24 DIAGNOSIS — E869 Volume depletion, unspecified: Secondary | ICD-10-CM | POA: Diagnosis present

## 2020-07-24 DIAGNOSIS — D689 Coagulation defect, unspecified: Secondary | ICD-10-CM | POA: Diagnosis present

## 2020-07-24 DIAGNOSIS — E1159 Type 2 diabetes mellitus with other circulatory complications: Secondary | ICD-10-CM | POA: Diagnosis present

## 2020-07-24 DIAGNOSIS — Z7984 Long term (current) use of oral hypoglycemic drugs: Secondary | ICD-10-CM | POA: Diagnosis not present

## 2020-07-24 DIAGNOSIS — E1165 Type 2 diabetes mellitus with hyperglycemia: Secondary | ICD-10-CM | POA: Diagnosis not present

## 2020-07-24 DIAGNOSIS — E785 Hyperlipidemia, unspecified: Secondary | ICD-10-CM | POA: Diagnosis present

## 2020-07-24 DIAGNOSIS — Z79899 Other long term (current) drug therapy: Secondary | ICD-10-CM

## 2020-07-24 DIAGNOSIS — R0902 Hypoxemia: Secondary | ICD-10-CM | POA: Diagnosis not present

## 2020-07-24 DIAGNOSIS — R112 Nausea with vomiting, unspecified: Secondary | ICD-10-CM

## 2020-07-24 DIAGNOSIS — Z882 Allergy status to sulfonamides status: Secondary | ICD-10-CM

## 2020-07-24 DIAGNOSIS — E1141 Type 2 diabetes mellitus with diabetic mononeuropathy: Secondary | ICD-10-CM

## 2020-07-24 LAB — DIFFERENTIAL
Abs Immature Granulocytes: 0.02 10*3/uL (ref 0.00–0.07)
Basophils Absolute: 0 10*3/uL (ref 0.0–0.1)
Basophils Relative: 0 %
Eosinophils Absolute: 0 10*3/uL (ref 0.0–0.5)
Eosinophils Relative: 0 %
Immature Granulocytes: 0 %
Lymphocytes Relative: 18 %
Lymphs Abs: 1.5 10*3/uL (ref 0.7–4.0)
Monocytes Absolute: 1 10*3/uL (ref 0.1–1.0)
Monocytes Relative: 12 %
Neutro Abs: 5.6 10*3/uL (ref 1.7–7.7)
Neutrophils Relative %: 70 %

## 2020-07-24 LAB — COMPREHENSIVE METABOLIC PANEL
ALT: 16 U/L (ref 0–44)
AST: 21 U/L (ref 15–41)
Albumin: 3.6 g/dL (ref 3.5–5.0)
Alkaline Phosphatase: 52 U/L (ref 38–126)
Anion gap: 10 (ref 5–15)
BUN: 39 mg/dL — ABNORMAL HIGH (ref 8–23)
CO2: 24 mmol/L (ref 22–32)
Calcium: 8.9 mg/dL (ref 8.9–10.3)
Chloride: 101 mmol/L (ref 98–111)
Creatinine, Ser: 2.07 mg/dL — ABNORMAL HIGH (ref 0.44–1.00)
GFR, Estimated: 25 mL/min — ABNORMAL LOW (ref >60)
Glucose, Bld: 149 mg/dL — ABNORMAL HIGH (ref 70–99)
Potassium: 3.8 mmol/L (ref 3.5–5.1)
Sodium: 135 mmol/L (ref 135–145)
Total Bilirubin: 0.6 mg/dL (ref 0.3–1.2)
Total Protein: 8 g/dL (ref 6.5–8.1)

## 2020-07-24 LAB — CBC
HCT: 35.3 % — ABNORMAL LOW (ref 36.0–46.0)
Hemoglobin: 10.7 g/dL — ABNORMAL LOW (ref 12.0–15.0)
MCH: 22.2 pg — ABNORMAL LOW (ref 26.0–34.0)
MCHC: 30.3 g/dL (ref 30.0–36.0)
MCV: 73.2 fL — ABNORMAL LOW (ref 80.0–100.0)
Platelets: 245 10*3/uL (ref 150–400)
RBC: 4.82 MIL/uL (ref 3.87–5.11)
RDW: 14.6 % (ref 11.5–15.5)
WBC: 8.1 10*3/uL (ref 4.0–10.5)
nRBC: 0 % (ref 0.0–0.2)

## 2020-07-24 LAB — APTT: aPTT: 34 seconds (ref 24–36)

## 2020-07-24 LAB — PROTIME-INR
INR: 1.6 — ABNORMAL HIGH (ref 0.8–1.2)
Prothrombin Time: 18.6 seconds — ABNORMAL HIGH (ref 11.4–15.2)

## 2020-07-24 NOTE — ED Triage Notes (Signed)
Pt has had two syncopal episodes (1) last night and (1 a couple of hours ago. Currently, A&O x 4. States only complaint is "tired". States hasn't been eating because she has been having a hard time swallowing x2-3 days.

## 2020-07-25 ENCOUNTER — Encounter (HOSPITAL_COMMUNITY): Payer: Self-pay | Admitting: Internal Medicine

## 2020-07-25 ENCOUNTER — Inpatient Hospital Stay (HOSPITAL_COMMUNITY): Payer: Medicare HMO

## 2020-07-25 ENCOUNTER — Ambulatory Visit (INDEPENDENT_AMBULATORY_CARE_PROVIDER_SITE_OTHER): Payer: Medicare HMO

## 2020-07-25 DIAGNOSIS — I6930 Unspecified sequelae of cerebral infarction: Secondary | ICD-10-CM | POA: Diagnosis not present

## 2020-07-25 DIAGNOSIS — I10 Essential (primary) hypertension: Secondary | ICD-10-CM | POA: Diagnosis not present

## 2020-07-25 DIAGNOSIS — Z79899 Other long term (current) drug therapy: Secondary | ICD-10-CM | POA: Diagnosis not present

## 2020-07-25 DIAGNOSIS — U071 COVID-19: Secondary | ICD-10-CM

## 2020-07-25 DIAGNOSIS — R55 Syncope and collapse: Secondary | ICD-10-CM | POA: Diagnosis not present

## 2020-07-25 DIAGNOSIS — E869 Volume depletion, unspecified: Secondary | ICD-10-CM | POA: Diagnosis present

## 2020-07-25 DIAGNOSIS — D689 Coagulation defect, unspecified: Secondary | ICD-10-CM | POA: Diagnosis not present

## 2020-07-25 DIAGNOSIS — R791 Abnormal coagulation profile: Secondary | ICD-10-CM | POA: Diagnosis present

## 2020-07-25 DIAGNOSIS — E1141 Type 2 diabetes mellitus with diabetic mononeuropathy: Secondary | ICD-10-CM

## 2020-07-25 DIAGNOSIS — T380X5A Adverse effect of glucocorticoids and synthetic analogues, initial encounter: Secondary | ICD-10-CM | POA: Diagnosis not present

## 2020-07-25 DIAGNOSIS — J1282 Pneumonia due to coronavirus disease 2019: Secondary | ICD-10-CM | POA: Diagnosis present

## 2020-07-25 DIAGNOSIS — N179 Acute kidney failure, unspecified: Secondary | ICD-10-CM | POA: Diagnosis present

## 2020-07-25 DIAGNOSIS — D509 Iron deficiency anemia, unspecified: Secondary | ICD-10-CM | POA: Diagnosis present

## 2020-07-25 DIAGNOSIS — Z7984 Long term (current) use of oral hypoglycemic drugs: Secondary | ICD-10-CM | POA: Diagnosis not present

## 2020-07-25 DIAGNOSIS — E1159 Type 2 diabetes mellitus with other circulatory complications: Secondary | ICD-10-CM | POA: Diagnosis present

## 2020-07-25 DIAGNOSIS — I152 Hypertension secondary to endocrine disorders: Secondary | ICD-10-CM | POA: Diagnosis present

## 2020-07-25 DIAGNOSIS — E785 Hyperlipidemia, unspecified: Secondary | ICD-10-CM | POA: Diagnosis present

## 2020-07-25 DIAGNOSIS — E118 Type 2 diabetes mellitus with unspecified complications: Secondary | ICD-10-CM | POA: Diagnosis not present

## 2020-07-25 DIAGNOSIS — I69312 Visuospatial deficit and spatial neglect following cerebral infarction: Secondary | ICD-10-CM | POA: Diagnosis not present

## 2020-07-25 DIAGNOSIS — E1165 Type 2 diabetes mellitus with hyperglycemia: Secondary | ICD-10-CM | POA: Diagnosis not present

## 2020-07-25 DIAGNOSIS — Z8673 Personal history of transient ischemic attack (TIA), and cerebral infarction without residual deficits: Secondary | ICD-10-CM

## 2020-07-25 DIAGNOSIS — I639 Cerebral infarction, unspecified: Secondary | ICD-10-CM

## 2020-07-25 DIAGNOSIS — E860 Dehydration: Secondary | ICD-10-CM | POA: Diagnosis present

## 2020-07-25 DIAGNOSIS — I69311 Memory deficit following cerebral infarction: Secondary | ICD-10-CM | POA: Diagnosis not present

## 2020-07-25 DIAGNOSIS — Z882 Allergy status to sulfonamides status: Secondary | ICD-10-CM | POA: Diagnosis not present

## 2020-07-25 DIAGNOSIS — Z8249 Family history of ischemic heart disease and other diseases of the circulatory system: Secondary | ICD-10-CM | POA: Diagnosis not present

## 2020-07-25 DIAGNOSIS — Z90710 Acquired absence of both cervix and uterus: Secondary | ICD-10-CM | POA: Diagnosis not present

## 2020-07-25 DIAGNOSIS — Z87891 Personal history of nicotine dependence: Secondary | ICD-10-CM | POA: Diagnosis not present

## 2020-07-25 DIAGNOSIS — I69351 Hemiplegia and hemiparesis following cerebral infarction affecting right dominant side: Secondary | ICD-10-CM | POA: Diagnosis not present

## 2020-07-25 HISTORY — DX: Pneumonia due to coronavirus disease 2019: J12.82

## 2020-07-25 HISTORY — DX: COVID-19: U07.1

## 2020-07-25 LAB — URINALYSIS, ROUTINE W REFLEX MICROSCOPIC
Bilirubin Urine: NEGATIVE
Glucose, UA: NEGATIVE mg/dL
Ketones, ur: 5 mg/dL — AB
Nitrite: NEGATIVE
Protein, ur: 30 mg/dL — AB
Specific Gravity, Urine: 1.017 (ref 1.005–1.030)
WBC, UA: >50 WBC/hpf — ABNORMAL HIGH (ref 0–5)
pH: 5 (ref 5.0–8.0)

## 2020-07-25 LAB — CUP PACEART REMOTE DEVICE CHECK
Date Time Interrogation Session: 20211224233706
Implantable Pulse Generator Implant Date: 20191022

## 2020-07-25 LAB — BRAIN NATRIURETIC PEPTIDE: B Natriuretic Peptide: 351.6 pg/mL — ABNORMAL HIGH (ref 0.0–100.0)

## 2020-07-25 LAB — HEMOGLOBIN A1C
Hgb A1c MFr Bld: 6.9 % — ABNORMAL HIGH (ref 4.8–5.6)
Mean Plasma Glucose: 151.33 mg/dL

## 2020-07-25 LAB — FERRITIN: Ferritin: 753 ng/mL — ABNORMAL HIGH (ref 11–307)

## 2020-07-25 LAB — TYPE AND SCREEN
ABO/RH(D): O POS
Antibody Screen: NEGATIVE

## 2020-07-25 LAB — FIBRINOGEN: Fibrinogen: 672 mg/dL — ABNORMAL HIGH (ref 210–475)

## 2020-07-25 LAB — CBG MONITORING, ED
Glucose-Capillary: 116 mg/dL — ABNORMAL HIGH (ref 70–99)
Glucose-Capillary: 167 mg/dL — ABNORMAL HIGH (ref 70–99)
Glucose-Capillary: 206 mg/dL — ABNORMAL HIGH (ref 70–99)

## 2020-07-25 LAB — D-DIMER, QUANTITATIVE: D-Dimer, Quant: 0.97 ug/mL-FEU — ABNORMAL HIGH (ref 0.00–0.50)

## 2020-07-25 LAB — GROUP A STREP BY PCR: Group A Strep by PCR: NOT DETECTED

## 2020-07-25 LAB — GLUCOSE, CAPILLARY: Glucose-Capillary: 201 mg/dL — ABNORMAL HIGH (ref 70–99)

## 2020-07-25 LAB — LACTATE DEHYDROGENASE: LDH: 171 U/L (ref 98–192)

## 2020-07-25 LAB — TROPONIN I (HIGH SENSITIVITY)
Troponin I (High Sensitivity): 20 ng/L — ABNORMAL HIGH (ref <18)
Troponin I (High Sensitivity): 21 ng/L — ABNORMAL HIGH (ref <18)

## 2020-07-25 LAB — CREATININE, URINE, RANDOM: Creatinine, Urine: 255.19 mg/dL

## 2020-07-25 LAB — PROCALCITONIN: Procalcitonin: 0.7 ng/mL

## 2020-07-25 LAB — HEPATITIS B SURFACE ANTIGEN: Hepatitis B Surface Ag: NONREACTIVE

## 2020-07-25 LAB — RESP PANEL BY RT-PCR (FLU A&B, COVID) ARPGX2
Influenza A by PCR: NEGATIVE
Influenza B by PCR: NEGATIVE
SARS Coronavirus 2 by RT PCR: POSITIVE — AB

## 2020-07-25 LAB — C-REACTIVE PROTEIN: CRP: 6.5 mg/dL — ABNORMAL HIGH (ref <1.0)

## 2020-07-25 LAB — ABO/RH: ABO/RH(D): O POS

## 2020-07-25 MED ORDER — AMLODIPINE BESYLATE 10 MG PO TABS
10.0000 mg | ORAL_TABLET | Freq: Every day | ORAL | Status: DC
  Administered 2020-07-25 – 2020-07-27 (×3): 10 mg via ORAL
  Filled 2020-07-25: qty 2
  Filled 2020-07-25 (×2): qty 1

## 2020-07-25 MED ORDER — CARVEDILOL 25 MG PO TABS
25.0000 mg | ORAL_TABLET | Freq: Two times a day (BID) | ORAL | Status: DC
  Administered 2020-07-25 – 2020-07-27 (×5): 25 mg via ORAL
  Filled 2020-07-25: qty 1
  Filled 2020-07-25: qty 2
  Filled 2020-07-25 (×2): qty 1
  Filled 2020-07-25: qty 2

## 2020-07-25 MED ORDER — ATORVASTATIN CALCIUM 80 MG PO TABS
80.0000 mg | ORAL_TABLET | Freq: Every day | ORAL | Status: DC
  Administered 2020-07-25 – 2020-07-27 (×3): 80 mg via ORAL
  Filled 2020-07-25 (×3): qty 1

## 2020-07-25 MED ORDER — PREDNISONE 5 MG PO TABS: 50.0000 mg | ORAL_TABLET | Freq: Every day | ORAL | Status: DC

## 2020-07-25 MED ORDER — SODIUM CHLORIDE 0.9 % IV SOLN
100.0000 mg | Freq: Every day | INTRAVENOUS | Status: DC
  Administered 2020-07-26 – 2020-07-27 (×2): 100 mg via INTRAVENOUS
  Filled 2020-07-25 (×3): qty 20

## 2020-07-25 MED ORDER — INSULIN ASPART 100 UNIT/ML ~~LOC~~ SOLN
0.0000 [IU] | Freq: Every day | SUBCUTANEOUS | Status: DC
  Administered 2020-07-26: 22:00:00 3 [IU] via SUBCUTANEOUS
  Administered 2020-07-26: 5 [IU] via SUBCUTANEOUS

## 2020-07-25 MED ORDER — SODIUM CHLORIDE 0.9 % IV SOLN: Freq: Once | INTRAVENOUS | Status: AC

## 2020-07-25 MED ORDER — ASCORBIC ACID 500 MG PO TABS
500.0000 mg | ORAL_TABLET | Freq: Every day | ORAL | Status: DC
  Administered 2020-07-25 – 2020-07-27 (×3): 500 mg via ORAL
  Filled 2020-07-25 (×3): qty 1

## 2020-07-25 MED ORDER — LACTATED RINGERS IV BOLUS
1000.0000 mL | Freq: Once | INTRAVENOUS | Status: AC
  Administered 2020-07-25: 06:00:00 1000 mL via INTRAVENOUS

## 2020-07-25 MED ORDER — PHENOL 1.4 % MT LIQD
1.0000 | OROMUCOSAL | Status: DC | PRN
  Filled 2020-07-25: qty 177

## 2020-07-25 MED ORDER — SODIUM CHLORIDE 0.9 % IV SOLN: INTRAVENOUS | Status: DC

## 2020-07-25 MED ORDER — GUAIFENESIN-DM 100-10 MG/5ML PO SYRP: 10.0000 mL | ORAL_SOLUTION | ORAL | Status: DC | PRN

## 2020-07-25 MED ORDER — SODIUM CHLORIDE 0.9% FLUSH
3.0000 mL | Freq: Two times a day (BID) | INTRAVENOUS | Status: DC
  Administered 2020-07-26 – 2020-07-27 (×3): 3 mL via INTRAVENOUS

## 2020-07-25 MED ORDER — ZINC SULFATE 220 (50 ZN) MG PO CAPS
220.0000 mg | ORAL_CAPSULE | Freq: Every day | ORAL | Status: DC
  Administered 2020-07-25 – 2020-07-27 (×3): 220 mg via ORAL
  Filled 2020-07-25 (×3): qty 1

## 2020-07-25 MED ORDER — STUDY - ARCADIA - ASPIRIN 81MG OR PLACEBO (PI-SETHI)
81.0000 mg | ORAL_TABLET | ORAL | Status: DC
  Administered 2020-07-25 – 2020-07-27 (×3): 81 mg via ORAL
  Filled 2020-07-25 (×2): qty 1

## 2020-07-25 MED ORDER — METHYLPREDNISOLONE SODIUM SUCC 40 MG IJ SOLR
0.5000 mg/kg | Freq: Two times a day (BID) | INTRAMUSCULAR | Status: DC
  Administered 2020-07-25 – 2020-07-27 (×5): 35.2 mg via INTRAVENOUS
  Filled 2020-07-25 (×5): qty 1

## 2020-07-25 MED ORDER — INSULIN ASPART 100 UNIT/ML ~~LOC~~ SOLN
0.0000 [IU] | Freq: Three times a day (TID) | SUBCUTANEOUS | Status: DC
  Administered 2020-07-25: 12:00:00 3 [IU] via SUBCUTANEOUS
  Administered 2020-07-25: 17:00:00 5 [IU] via SUBCUTANEOUS
  Administered 2020-07-26: 14:00:00 3 [IU] via SUBCUTANEOUS
  Administered 2020-07-26: 18:00:00 11 [IU] via SUBCUTANEOUS
  Administered 2020-07-26: 09:00:00 3 [IU] via SUBCUTANEOUS
  Administered 2020-07-27: 13:00:00 8 [IU] via SUBCUTANEOUS
  Administered 2020-07-27: 08:00:00 5 [IU] via SUBCUTANEOUS

## 2020-07-25 MED ORDER — SODIUM CHLORIDE 0.9 % IV SOLN
200.0000 mg | Freq: Once | INTRAVENOUS | Status: AC
  Administered 2020-07-25: 10:00:00 200 mg via INTRAVENOUS
  Filled 2020-07-25: qty 40

## 2020-07-25 MED ORDER — HYDROCOD POLST-CPM POLST ER 10-8 MG/5ML PO SUER: 5.0000 mL | Freq: Two times a day (BID) | ORAL | Status: DC | PRN

## 2020-07-25 MED ORDER — STUDY - ARCADIA - APIXABAN 2.5MG OR PLACEBO (PI-SETHI)
2.5000 mg | ORAL_TABLET | Freq: Two times a day (BID) | ORAL | Status: DC
  Administered 2020-07-25: 11:00:00 2.5 mg via ORAL
  Filled 2020-07-25: qty 1

## 2020-07-25 MED ORDER — GABAPENTIN 100 MG PO CAPS
100.0000 mg | ORAL_CAPSULE | Freq: Three times a day (TID) | ORAL | Status: DC
  Administered 2020-07-25 – 2020-07-27 (×7): 100 mg via ORAL
  Filled 2020-07-25 (×7): qty 1

## 2020-07-25 MED ORDER — ALBUTEROL SULFATE HFA 108 (90 BASE) MCG/ACT IN AERS
2.0000 | INHALATION_SPRAY | Freq: Four times a day (QID) | RESPIRATORY_TRACT | Status: DC
  Administered 2020-07-25 (×3): 2 via RESPIRATORY_TRACT
  Filled 2020-07-25: qty 6.7

## 2020-07-25 MED ORDER — HYDRALAZINE HCL 20 MG/ML IJ SOLN: 10.0000 mg | INTRAMUSCULAR | Status: DC | PRN

## 2020-07-25 MED ORDER — ACETAMINOPHEN 325 MG PO TABS: 650.0000 mg | ORAL_TABLET | Freq: Four times a day (QID) | ORAL | Status: DC | PRN

## 2020-07-25 NOTE — ED Notes (Signed)
Daughter [name]) [phone] called and update given

## 2020-07-25 NOTE — ED Notes (Signed)
Spoke with daughter [name] she will be bring the study medication to the ED.

## 2020-07-25 NOTE — H&P (Addendum)
History and Physical    [name] JQB:[id number] DOB: 1946-03-16 DOA: 07/24/2020  Referring MD/NP/PA: Jennette Kettle, MD PCP: Antony Blackbird, MD (Inactive)  Patient coming from: Home (niece who has developmental delay lives with her) via EMS  Chief Complaint: "blacked out"   I have personally briefly reviewed patient's old medical records in [place]   HPI: Heather Rogers is a 74 y.o. female with medical history significant of hypertension, hyperlipidemia, CVA in 04/2018 with residual short term memory loss and peripheral vision loss, s/p loop recorder implantation in 04/2018, and diabetes mellitus type 2 presents with complaints of blacking out.  First time occurred 2 days ago while she was walking to the kitchen and then the second time occurred last night while sitting talk on the phone with her daughter.  She reported feeling lightheaded prior to the episodes and had vomited, but does not remember doing so.  When she awoke last night she saw a puddle of nonbloody emesis on the floor.  She is unsure of how long she was out, but does not think it was very long.  Denies sustaining any injury, palpitations, chest pain, shortness of breath, calf pain, abdominal pain, dysuria, or diarrhea.  She has never passed out before.  Patient reports that she had received her [place] vaccine in April, and had just received a Moderna booster vaccine 3 days ago.  Prior to the patient receiving her vaccine her daughter reported that she had been complaining of a scratchy throat.  Due to her sore throat she had not been eating and drinking much. She had tried remedies at home without any relief in symptoms.  Her daughter had taken her to be tested for COVID-19 yesterday, but results would not be available for 72 hours.  Patient possibly became sick from going to church or after going out with her relative shopping last week, but was not aware of any sick contacts to her  knowledge.  ED Course: Upon admission into the emergency department patient was seen to be afebrile with vital signs relatively stable.  CT imaging of the head showed no acute abnormalities.  Labs significant for hemoglobin 10.7, BUN 39, creatinine 2.07,  glucose 149, and INR 1.6.  Group A strep negative by PCR.  COVID-19 screening was positive.  Patient has been given 1 L normal saline IV fluids.  TRH called to admit.  Chest x-ray revealed hypoinflated lungs with bibasilar opacities concerning for atelectasis versus infiltrate.   Review of Systems  Constitutional: Positive for malaise/fatigue. Negative for fever.  HENT: Positive for sore throat. Negative for nosebleeds.   Eyes: Negative for double vision and pain.  Respiratory: Positive for cough and sputum production.   Cardiovascular: Negative for chest pain and leg swelling.  Gastrointestinal: Positive for vomiting. Negative for abdominal pain and nausea.  Genitourinary: Negative for dysuria and flank pain.  Musculoskeletal: Positive for falls. Negative for back pain.  Skin: Negative for rash.  Neurological: Positive for loss of consciousness. Negative for focal weakness.  Endo/Heme/Allergies: Negative for polydipsia.  Psychiatric/Behavioral: Positive for memory loss. Negative for substance abuse.    Past Medical History:  Diagnosis Date  . Diabetes ([place])   . Hypertension   . Stroke (cerebrum) [facility])     Past Surgical History:  Procedure Laterality Date  . EYE SURGERY Left 04/06/2020   Implant placed  . EYE SURGERY Right 03/30/2020   Implant placed   . hysterectomy    . LOOP RECORDER INSERTION  N/A 05/20/2018   Procedure: LOOP RECORDER INSERTION;  Surgeon: Thompson Grayer, MD;  Location: [facility];  Service: Cardiovascular;  Laterality: N/A;  . TEE WITHOUT CARDIOVERSION N/A 05/20/2018   Procedure: TRANSESOPHAGEAL ECHOCARDIOGRAM (TEE);  Surgeon: Sueanne Margarita, MD;  Location: [facility] ENDOSCOPY;  Service: Cardiovascular;   Laterality: N/A;     reports that she has quit smoking. She has never used smokeless tobacco. She reports that she does not drink alcohol and does not use drugs.  Allergies  Allergen Reactions  . Sulfa Antibiotics     Itching     Family History  Problem Relation Age of Onset  . Hypertension Mother   . Stroke Mother   . Hypertension Father     Prior to Admission medications   Medication Sig Start Date End Date Taking? Authorizing Provider  Accu-Chek FastClix Lancets MISC Check blood sugar fasting and before meals and again if pt feels bad (symptoms of hypo). 04/11/20  Yes Minette Brine, Amy J, NP  allopurinol (ZYLOPRIM) 100 MG tablet Take 1 tablet (100 mg total) by mouth daily. 06/20/18  Yes Fulp, Cammie, MD  amLODipine (NORVASC) 10 MG tablet Take 1 tablet (10 mg total) by mouth daily. To lower blood pressure 04/11/20 07/10/20 Yes Minette Brine, Amy J, NP  atorvastatin (LIPITOR) 80 MG tablet One pill by mouth in the evening to lower cholesterol 04/11/20  Yes Minette Brine, Amy J, NP  Blood Glucose Monitoring Suppl (ACCU-CHEK GUIDE) w/Device KIT 1 each by Does not apply route as directed. 08/25/18  Yes Fulp, Cammie, MD  carvedilol (COREG) 25 MG tablet Take 1 tablet (25 mg total) by mouth 2 (two) times daily with a meal. 04/11/20  Yes Minette Brine, Amy J, NP  cholecalciferol (VITAMIN D3) 25 MCG (1000 UNIT) tablet Take 1,000 Units by mouth daily.   Yes [provider]  gabapentin (NEURONTIN) 100 MG capsule Take 1 capsule (100 mg total) by mouth 3 (three) times daily. 07/18/20 10/16/20 Yes Minette Brine, Amy J, NP  glucose blood (ACCU-CHEK GUIDE) test strip CHECK BLOOD SUGAR FASTING AND BEFORE MEALS AND AGAIN IF PATIENT FEELS BAD (SYMPTOMS OF HYPO). 04/11/20  Yes Minette Brine, Amy J, NP  hydrochlorothiazide (HYDRODIURIL) 12.5 MG tablet Take 1 tablet (12.5 mg total) by mouth daily. 04/11/20  Yes Minette Brine, Amy Lenna Sciara, NP  Study - [place] - apixaban 2.53m or Placebo Take 2.5 mg by mouth 2 (two) times daily.   Yes [provider]  Study - [place] - aspirin 88mor Placebo Take 81 mg by mouth daily.   Yes [provider]  valsartan (DIOVAN) 320 MG tablet Take 1 tablet (320 mg total) by mouth daily. To lower blood pressure 04/11/20  Yes Stephens, Amy J, NP  sitaGLIPtin-metformin (JANUMET) 50-500 MG tablet TAKE ONE TABLET BY MOUTH DAILY WITH BIGGEST MEAL OF THE DAY Patient taking differently: Take 1 tablet by mouth daily with supper. 07/12/20   NeCharlott RakesMD    Physical Exam:  Constitutional: Elderly female who appears sick Vitals:   07/25/20 0130 07/25/20 0304 07/25/20 0519 07/25/20 0600  BP: (!) 127/58 130/78 (!) 150/69 (!) 129/55  Pulse: 89 88 72 76  Resp: _0 Temp:   98.1 F (36.7 C)   TempSrc:   Oral   SpO2: 98% 98% 100% 98%  Weight:      Height:       Eyes: PERRL, lids and conjunctivae normal ENMT: Mucous membranes are dry. Posterior pharynx clear of any exudate or lesions.   Neck: normal,  supple, no lymphadenopathy, and no JVD. Respiratory: clear to auscultation bilaterally, no wheezing, no crackles. Normal respiratory effort. No accessory muscle use.  O2 saturation currently maintained on room air. Cardiovascular: Regular rate and rhythm, no murmurs / rubs / gallops. No extremity edema. 2+ pedal pulses. No carotid bruits.  Abdomen: no tenderness, no masses palpated. No hepatosplenomegaly. Bowel sounds positive.  Musculoskeletal: no clubbing / cyanosis. No joint deformity upper and lower extremities. Good ROM, no contractures. Normal muscle tone.  Skin: no rashes, lesions, ulcers. No induration Neurologic: CN 2-12 grossly intact. Sensation intact, DTR normal. Strength 5/5 in all 4.  Psychiatric: mild short-term memory loss. Alert and oriented x 3. Normal mood.     Labs on Admission: I have personally reviewed following labs and imaging studies  CBC: Recent Labs  Lab 07/24/20 2222  WBC 8.1  NEUTROABS 5.6  HGB 10.7*  HCT 35.3*  MCV 73.2*  PLT 785   Basic  Metabolic Panel: Recent Labs  Lab 07/24/20 2222  NA 135  K 3.8  CL 101  CO2 24  GLUCOSE 149*  BUN 39*  CREATININE 2.07*  CALCIUM 8.9   GFR: Estimated Creatinine Clearance: 21.9 mL/min (A) (by C-G formula based on SCr of 2.07 mg/dL (H)). Liver Function Tests: Recent Labs  Lab 07/24/20 2222  AST 21  ALT 16  ALKPHOS 52  BILITOT 0.6  PROT 8.0  ALBUMIN 3.6   No results for input(s): LIPASE, AMYLASE in the last 168 hours. No results for input(s): AMMONIA in the last 168 hours. Coagulation Profile: Recent Labs  Lab 07/24/20 2222  INR 1.6*   Cardiac Enzymes: No results for input(s): CKTOTAL, CKMB, CKMBINDEX, TROPONINI in the last 168 hours. BNP (last 3 results) No results for input(s): PROBNP in the last 8760 hours. HbA1C: No results for input(s): HGBA1C in the last 72 hours. CBG: No results for input(s): GLUCAP in the last 168 hours. Lipid Profile: No results for input(s): CHOL, HDL, LDLCALC, TRIG, CHOLHDL, LDLDIRECT in the last 72 hours. Thyroid Function Tests: No results for input(s): TSH, T4TOTAL, FREET4, T3FREE, THYROIDAB in the last 72 hours. Anemia Panel: No results for input(s): VITAMINB12, FOLATE, FERRITIN, TIBC, IRON, RETICCTPCT in the last 72 hours. Urine analysis:    Component Value Date/Time   COLORURINE STRAW (A) 05/15/2018 1040   APPEARANCEUR CLEAR 05/15/2018 1040   LABSPEC 1.006 05/15/2018 1040   PHURINE 5.0 05/15/2018 1040   GLUCOSEU 50 (A) 05/15/2018 1040   HGBUR NEGATIVE 05/15/2018 1040   BILIRUBINUR NEGATIVE 05/15/2018 1040   KETONESUR 5 (A) 05/15/2018 1040   PROTEINUR NEGATIVE 05/15/2018 1040   UROBILINOGEN 0.2 12/20/2009 1049   NITRITE NEGATIVE 05/15/2018 1040   LEUKOCYTESUR TRACE (A) 05/15/2018 1040   Sepsis Labs: Recent Results (from the past 240 hour(s))  Group A Strep by PCR     Status: None   Collection Time: 07/25/20  5:46 AM   Specimen: Nasopharyngeal Swab; Sterile Swab  Result Value Ref Range Status   Group A Strep by PCR NOT  DETECTED NOT DETECTED Final    Comment: Performed at [facility], [place] [address] [place], [place] [address]  Resp Panel by RT-PCR (Flu A&B, Covid) Nasopharyngeal Swab     Status: Abnormal   Collection Time: 07/25/20  5:46 AM   Specimen: Nasopharyngeal Swab; Nasopharyngeal(NP) swabs in vial transport medium  Result Value Ref Range Status   SARS Coronavirus 2 by RT PCR POSITIVE (A) NEGATIVE Final    Comment: RESULT CALLED TO, READ BACK BY  AND VERIFIED WITH: K. MOON RN, AT 3710 07/25/20 BY D. VANHOOK (NOTE) SARS-CoV-2 target nucleic acids are DETECTED.  The SARS-CoV-2 RNA is generally detectable in upper respiratory specimens during the acute phase of infection. Positive results are indicative of the presence of the identified virus, but do not rule out bacterial infection or co-infection with other pathogens not detected by the test. Clinical correlation with patient history and other diagnostic information is necessary to determine patient infection status. The expected result is Negative.  Fact Sheet for Patients: EntrepreneurPulse.com.au  Fact Sheet for Healthcare Providers: IncredibleEmployment.be  This test is not yet approved or cleared by the [place] FDA and  has been authorized for detection and/or diagnosis of SARS-CoV-2 by FDA under an Emergency Use Authorization (EUA).  This EUA will remain in effect (meaning this te st can be used) for the duration of  the COVID-19 declaration under Section 564(b)(1) of the Act, 21 U.S.C. section 360bbb-3(b)(1), unless the authorization is terminated or revoked sooner.     Influenza A by PCR NEGATIVE NEGATIVE Final   Influenza B by PCR NEGATIVE NEGATIVE Final    Comment: (NOTE) The Xpert Xpress SARS-CoV-2/FLU/RSV plus assay is intended as an aid in the diagnosis of influenza from Nasopharyngeal swab specimens and should not be used as a sole basis for treatment. Nasal washings  and aspirates are unacceptable for Xpert Xpress SARS-CoV-2/FLU/RSV testing.  Fact Sheet for Patients: EntrepreneurPulse.com.au  Fact Sheet for Healthcare Providers: IncredibleEmployment.be  This test is not yet approved or cleared by the [place] FDA and has been authorized for detection and/or diagnosis of SARS-CoV-2 by FDA under an Emergency Use Authorization (EUA). This EUA will remain in effect (meaning this test can be used) for the duration of the COVID-19 declaration under Section 564(b)(1) of the Act, 21 U.S.C. section 360bbb-3(b)(1), unless the authorization is terminated or revoked.  Performed at [facility], [place] [address], [place], [place] [address]      Radiological Exams on Admission: CT HEAD WO CONTRAST  Result Date: 07/24/2020 CLINICAL DATA:  Dizziness syncopal episodes EXAM: CT HEAD WITHOUT CONTRAST TECHNIQUE: Contiguous axial images were obtained from the base of the skull through the vertex without intravenous contrast. COMPARISON:  CT 05/15/2018 FINDINGS: Brain: Region of encephalomalacia involving the left occipital lobe compatible with expected evolution of a previously seen infarct. Stable remote left thalamic lacunar infarct. No evidence of new acute CT evident cortically based or vascular territory infarction, hemorrhage, hydrocephalus, extra-axial collection, visible mass lesion or mass effect. Symmetric prominence of the ventricles, cisterns and sulci compatible with parenchymal volume loss. Additional patchy and confluent areas of white matter hypoattenuation are most compatible with chronic microvascular angiopathy. Stable dural calcifications and tiny para falcine lipoma. Vascular: Atherosclerotic calcification of the carotid siphons. No hyperdense vessel. Skull: No calvarial fracture or suspicious osseous lesion. No scalp swelling or hematoma. Sinuses/Orbits: Paranasal sinuses and mastoid air cells are  predominantly clear aside from some minimal mural thickening in the ethmoids. Orbital structures are unremarkable aside from prior lens extractions. Other: None. IMPRESSION: 1. No acute intracranial abnormality. 2. Encephalomalacia from remote infarct of the left occipital lobe. Additional remote left thalamic lacunar infarct. 3. Stable parenchymal volume loss and chronic microvascular angiopathy. Electronically Signed   By: Lovena Le M.D.   On: 07/24/2020 22:52    EKG: Independently reviewed.  Normal sinus rhythm at 72 bpm  Assessment/Plan Syncope and collapse: Acute.  Patient presents after having 2 syncopal episodes at home.  With each  episode patient reported feeling lightheaded prior to passing out.  EKG within normal limits and she denies any complaints of chest pain. Given poor p.o. intake suspect related with dehydration and less likely arrhythmia.  Loop recorder had recently been interrogated on 12/24 and showed no arrhythmias. -Admit to a telemetry bed -Check orthostatic vital signs  -Check troponin and D-dimer -Follow-up telemetry overnight  Pneumonia due to COVID-19: Patient with 3-day history of cough and sore throat.  Due to her symptoms she was tested on Sunday for COVID-19 was incidentally found to be positive for COVID-19.  Chest x-ray revealed hyperinflation with bibasilar opacities.  Currently O2 saturations maintained on room air.  In the setting of COVID-19 with suspect opacities likely pneumonia. -COVID-19 order set utilized -Airborne precautions -Check inflammatory markers, and monitor daily -Remdesivir per pharmacy -Solu-Medrol IV -Albuterol inhaler as needed -Antitussives as needed -Vitamin C and zinc -Chloraseptic Spray  Acute renal failure: Patient baseline creatinine previously have been 0.88 in 03/2020.  She presents with creatinine elevated to 2.07 with BUN 39.  Home medications include diuretics and suspect likely prerenal in nature.  Patient has been given 1 L  normal saline IV fluids in ED. -Hold nephrotoxic agents -Check urine creatinine and urine urea to calculate FeUr given patient had been on diuretic -Continue normal saline IV fluids at 75 mL/h -Recheck kidney function in a.m.  Diabetes mellitus type 2, well controlled: On admission glucose 149.  Last hemoglobin A1c noted to be 6.4. Home medications include sitagliptin-metformin 50-500 nightly with supper. -Hypoglycemic protocols -Hold home oral medication -CBGs before every meal with moderate SSI given patient started on steroids -Adjust regimen as needed  Essential hypertension: Home blood pressure medications include Norvasc 10 mg daily, Coreg 25 mg twice daily, hydrochlorothiazide 12.5 mg daily, and Diovan 320 mg daily. -Hold hydrochlorothiazide and Diovan given acute renal failure -Continue Coreg and amlodipine as tolerated  History of CVA with residual deficit: Patient had a stroke occipital stroke in 04/2018.  As result of the stroke patient has short-term memory loss and peripheral vision loss.  She sees Dr. Leonie Man in outpatient setting and had been involved in a [place] study. -Continue statin and Remi Haggard study -Dr. Leonie Man made aware of patient's admission into the hospital and recommended continuing [place] study unless indication for need of anticoagulation  Dyslipidemia: Last lipid panel revealed total cholesterol 126, HDL 27, LDL 64, triglycerides 175.  Home medications atorvastatin 80 mg daily. -Continues statin  Coagulopathy: Acute.  INR noted to be 1.6 on admission.  Suspect this likely to be related with the [place] study.  DVT prophylaxis: SCDs and [place] study Code Status: Full Family Communication: Daughter updated up with the phone Disposition Plan: Hopefully discharge home in 2 to 3 days once medically stable Consults called: None Admission status: Inpatient, given AKI and need for IV fluid resuscitation with new diagnosis of Covid  Norval Morton MD [facility]   If 7PM-7AM, please contact night-coverage   07/25/2020, 7:38 AM

## 2020-07-25 NOTE — ED Notes (Signed)
Bedpan given moderate amt. Liquid brown

## 2020-07-25 NOTE — ED Notes (Signed)
Patient has been talking to daughter all day

## 2020-07-25 NOTE — ED Provider Notes (Signed)
[facility] EMERGENCY DEPARTMENT Provider Note   CSN: [id number] Arrival date & time: 07/24/20  2153   History Chief Complaint  Patient presents with  . Loss of Consciousness    Heather Rogers is a 74 y.o. female.  The history is provided by the patient.  Loss of Consciousness She has history of hypertension, diabetes, stroke, and comes in because of syncopal episodes last night and the night before.  She states that she has had a sore throat for the last 2 days, and has not been able to eat and drink much because of that.  She had syncopal episodes both last night and the night before.  On both occasions, she felt weak and lightheaded prior to syncope.  On both occasions, she woke up having noted that she had vomited but does not recall vomiting.  She denies any palpitation or chest pain or heaviness or tightness or pressure.  She denies any diaphoresis.  There was no observed seizure activity.  Past Medical History:  Diagnosis Date  . Diabetes ([place])   . Hypertension   . Stroke (cerebrum) [facility])     Patient Active Problem List   Diagnosis Date Noted  . Spastic hemiplegia of right dominant side as late effect of cerebral infarction ([place]) 11/22/2018  . Stroke [facility]) 05/15/2018    Past Surgical History:  Procedure Laterality Date  . EYE SURGERY Left 04/06/2020   Implant placed  . EYE SURGERY Right 03/30/2020   Implant placed   . hysterectomy    . LOOP RECORDER INSERTION N/A 05/20/2018   Procedure: LOOP RECORDER INSERTION;  Surgeon: Thompson Grayer, MD;  Location: [place] CV LAB;  Service: Cardiovascular;  Laterality: N/A;  . TEE WITHOUT CARDIOVERSION N/A 05/20/2018   Procedure: TRANSESOPHAGEAL ECHOCARDIOGRAM (TEE);  Surgeon: Sueanne Margarita, MD;  Location: [facility] ENDOSCOPY;  Service: Cardiovascular;  Laterality: N/A;     OB History   No obstetric history on file.     Family History  Problem Relation Age of Onset  . Hypertension Mother   . Stroke  Mother   . Hypertension Father     Social History   Tobacco Use  . Smoking status: Former Research scientist (life sciences)  . Smokeless tobacco: Never Used  Substance Use Topics  . Alcohol use: Never  . Drug use: Never    Home Medications Prior to Admission medications   Medication Sig Start Date End Date Taking? Authorizing Provider  Accu-Chek FastClix Lancets MISC Check blood sugar fasting and before meals and again if pt feels bad (symptoms of hypo). 04/11/20   Camillia Herter, NP  allopurinol (ZYLOPRIM) 100 MG tablet Take 1 tablet (100 mg total) by mouth daily. 06/20/18   Fulp, Cammie, MD  amLODipine (NORVASC) 10 MG tablet Take 1 tablet (10 mg total) by mouth daily. To lower blood pressure 04/11/20 07/10/20  Camillia Herter, NP  atorvastatin (LIPITOR) 80 MG tablet One pill by mouth in the evening to lower cholesterol 04/11/20   Camillia Herter, NP  Blood Glucose Monitoring Suppl (ACCU-CHEK GUIDE) w/Device KIT 1 each by Does not apply route as directed. 08/25/18   Fulp, Cammie, MD  carvedilol (COREG) 25 MG tablet Take 1 tablet (25 mg total) by mouth 2 (two) times daily with a meal. 04/11/20   Camillia Herter, NP  gabapentin (NEURONTIN) 100 MG capsule Take 1 capsule (100 mg total) by mouth 3 (three) times daily. 07/18/20 10/16/20  Camillia Herter, NP  glucose blood (ACCU-CHEK GUIDE) test strip  CHECK BLOOD SUGAR FASTING AND BEFORE MEALS AND AGAIN IF PATIENT FEELS BAD (SYMPTOMS OF HYPO). 04/11/20   Camillia Herter, NP  hydrochlorothiazide (HYDRODIURIL) 12.5 MG tablet Take 1 tablet (12.5 mg total) by mouth daily. 04/11/20   Camillia Herter, NP  sitaGLIPtin-metformin (JANUMET) 50-500 MG tablet TAKE ONE TABLET BY MOUTH DAILY WITH BIGGEST MEAL OF THE DAY 07/12/20   Charlott Rakes, MD  Study - [place] - apixaban 2.71m or Placebo Take 2.5 mg by mouth 2 (two) times daily.    [provider]  Study - [place] - aspirin 855mor Placebo Take 81 mg by mouth daily.    [provider]  valsartan (DIOVAN) 320 MG  tablet Take 1 tablet (320 mg total) by mouth daily. To lower blood pressure 04/11/20   StCamillia HerterNP    Allergies    Sulfa antibiotics  Review of Systems   Review of Systems  Cardiovascular: Positive for syncope.  All other systems reviewed and are negative.   Physical Exam Updated Vital Signs BP 130/78   Pulse 88   Temp 98.7 F (37.1 C) (Oral)   Resp 18   Ht 5' 2"  (1.575 m)   Wt 70.3 kg   SpO2 98%   BMI 28.35 kg/m   Physical Exam Vitals and nursing note reviewed.   [age]ear old female, resting comfortably and in no acute distress. Vital signs are normal. Oxygen saturation is 98%, which is normal. Head is normocephalic and atraumatic. PERRLA, EOMI. Oropharynx is mildly erythematous without exudate. Neck is nontender and supple without adenopathy or JVD.  There are no carotid bruits. Back is nontender and there is no CVA tenderness. Lungs are clear without rales, wheezes, or rhonchi. Chest is nontender. Heart has regular rate and rhythm without murmur. Abdomen is soft, flat, nontender without masses or hepatosplenomegaly and peristalsis is normoactive. Extremities have no cyanosis or edema, full range of motion is present. Skin is warm and dry without rash. Neurologic: Mental status is normal, cranial nerves are intact, moves all extremities equally.  ED Results / Procedures / Treatments   Labs (all labs ordered are listed, but only abnormal results are displayed) Labs Reviewed  PROTIME-INR - Abnormal; Notable for the following components:      Result Value   Prothrombin Time 18.6 (*)    INR 1.6 (*)    All other components within normal limits  CBC - Abnormal; Notable for the following components:   Hemoglobin 10.7 (*)    HCT 35.3 (*)    MCV 73.2 (*)    MCH 22.2 (*)    All other components within normal limits  COMPREHENSIVE METABOLIC PANEL - Abnormal; Notable for the following components:   Glucose, Bld 149 (*)    BUN 39 (*)    Creatinine, Ser 2.07 (*)     GFR, Estimated 25 (*)    All other components within normal limits  APTT  DIFFERENTIAL    EKG EKG Interpretation  Date/Time:  Sunday July 24 2020 21:56:42 EST Ventricular Rate:  72 PR Interval:  182 QRS Duration: 84 QT Interval:  402 QTC Calculation: 440 R Axis:   6 Text Interpretation: Normal sinus rhythm Normal ECG When compared with ECG of 05/15/2018, No significant change was found Confirmed by GlDelora [id number] 07/24/2020 11:37:06 PM   Radiology CT HEAD WO CONTRAST  Result Date: 07/24/2020 CLINICAL DATA:  Dizziness syncopal episodes EXAM: CT HEAD WITHOUT CONTRAST TECHNIQUE: Contiguous axial images were obtained from the base  of the skull through the vertex without intravenous contrast. COMPARISON:  CT 05/15/2018 FINDINGS: Brain: Region of encephalomalacia involving the left occipital lobe compatible with expected evolution of a previously seen infarct. Stable remote left thalamic lacunar infarct. No evidence of new acute CT evident cortically based or vascular territory infarction, hemorrhage, hydrocephalus, extra-axial collection, visible mass lesion or mass effect. Symmetric prominence of the ventricles, cisterns and sulci compatible with parenchymal volume loss. Additional patchy and confluent areas of white matter hypoattenuation are most compatible with chronic microvascular angiopathy. Stable dural calcifications and tiny para falcine lipoma. Vascular: Atherosclerotic calcification of the carotid siphons. No hyperdense vessel. Skull: No calvarial fracture or suspicious osseous lesion. No scalp swelling or hematoma. Sinuses/Orbits: Paranasal sinuses and mastoid air cells are predominantly clear aside from some minimal mural thickening in the ethmoids. Orbital structures are unremarkable aside from prior lens extractions. Other: None. IMPRESSION: 1. No acute intracranial abnormality. 2. Encephalomalacia from remote infarct of the left occipital lobe. Additional remote  left thalamic lacunar infarct. 3. Stable parenchymal volume loss and chronic microvascular angiopathy. Electronically Signed   By: Lovena Le M.D.   On: 07/24/2020 22:52    Procedures Procedures   Medications Ordered in ED Medications  lactated ringers bolus 1,000 mL (has no administration in time range)    ED Course  I have reviewed the triage vital signs and the nursing notes.  Pertinent labs & imaging results that were available during my care of the patient were reviewed by me and considered in my medical decision making (see chart for details).  MDM Rules/Calculators/A&P Syncope, possibly related to dehydration.  Doubt primary arrhythmia.  Episodes were not orthostatic.  No other symptoms to suggest coronary ischemia or pulmonary embolism.  Old records are reviewed, and she has no relevant past visits.  ECG shows no acute changes.  Labs show mild anemia with half gram drop in hemoglobin compared with 3 months ago.  Metabolic panel is significant for evidence of acute kidney injury with creatinine 2.07 and BUN 39 compared with 0.88 and 22 on 9/13.  CT of head showed no acute process.  At this point, dehydration seems the most likely cause for her symptoms.  She will be given IV fluids.  Will check strep screen to see if strep infection is responsible for her sore throat.  Case is discussed with Dr. Alcario Drought of [facility], who agrees to admit the patient.  Final Clinical Impression(s) / ED Diagnoses Final diagnoses:  Syncope, unspecified syncope type  Acute kidney injury (nontraumatic) (HCC)  Non-intractable vomiting with nausea, unspecified vomiting type  Microcytic anemia    Rx / DC Orders ED Discharge Orders    None       Delora Fuel, MD 25/75/05 [phone]

## 2020-07-25 NOTE — ED Notes (Signed)
Lunch tray given. 

## 2020-07-25 NOTE — ED Notes (Signed)
Report attempted 

## 2020-07-26 ENCOUNTER — Encounter (HOSPITAL_COMMUNITY): Payer: Self-pay | Admitting: Internal Medicine

## 2020-07-26 LAB — D-DIMER, QUANTITATIVE: D-Dimer, Quant: 1.01 ug/mL-FEU — ABNORMAL HIGH (ref 0.00–0.50)

## 2020-07-26 LAB — CBC WITH DIFFERENTIAL/PLATELET
Abs Immature Granulocytes: 0.01 10*3/uL (ref 0.00–0.07)
Basophils Absolute: 0 10*3/uL (ref 0.0–0.1)
Basophils Relative: 0 %
Eosinophils Absolute: 0 10*3/uL (ref 0.0–0.5)
Eosinophils Relative: 0 %
HCT: 31.7 % — ABNORMAL LOW (ref 36.0–46.0)
Hemoglobin: 10.3 g/dL — ABNORMAL LOW (ref 12.0–15.0)
Immature Granulocytes: 0 %
Lymphocytes Relative: 18 %
Lymphs Abs: 0.6 10*3/uL — ABNORMAL LOW (ref 0.7–4.0)
MCH: 22.9 pg — ABNORMAL LOW (ref 26.0–34.0)
MCHC: 32.5 g/dL (ref 30.0–36.0)
MCV: 70.6 fL — ABNORMAL LOW (ref 80.0–100.0)
Monocytes Absolute: 0.3 10*3/uL (ref 0.1–1.0)
Monocytes Relative: 8 %
Neutro Abs: 2.5 10*3/uL (ref 1.7–7.7)
Neutrophils Relative %: 74 %
Platelets: 218 10*3/uL (ref 150–400)
RBC: 4.49 MIL/uL (ref 3.87–5.11)
RDW: 14.4 % (ref 11.5–15.5)
WBC: 3.4 10*3/uL — ABNORMAL LOW (ref 4.0–10.5)
nRBC: 0 % (ref 0.0–0.2)

## 2020-07-26 LAB — COMPREHENSIVE METABOLIC PANEL
ALT: 24 U/L (ref 0–44)
AST: 32 U/L (ref 15–41)
Albumin: 3.2 g/dL — ABNORMAL LOW (ref 3.5–5.0)
Alkaline Phosphatase: 52 U/L (ref 38–126)
Anion gap: 10 (ref 5–15)
BUN: 37 mg/dL — ABNORMAL HIGH (ref 8–23)
CO2: 21 mmol/L — ABNORMAL LOW (ref 22–32)
Calcium: 9 mg/dL (ref 8.9–10.3)
Chloride: 106 mmol/L (ref 98–111)
Creatinine, Ser: 1.16 mg/dL — ABNORMAL HIGH (ref 0.44–1.00)
GFR, Estimated: 49 mL/min — ABNORMAL LOW (ref >60)
Glucose, Bld: 181 mg/dL — ABNORMAL HIGH (ref 70–99)
Potassium: 4 mmol/L (ref 3.5–5.1)
Sodium: 137 mmol/L (ref 135–145)
Total Bilirubin: 0.2 mg/dL — ABNORMAL LOW (ref 0.3–1.2)
Total Protein: 7.1 g/dL (ref 6.5–8.1)

## 2020-07-26 LAB — MAGNESIUM: Magnesium: 2.4 mg/dL (ref 1.7–2.4)

## 2020-07-26 LAB — GLUCOSE, CAPILLARY
Glucose-Capillary: 167 mg/dL — ABNORMAL HIGH (ref 70–99)
Glucose-Capillary: 173 mg/dL — ABNORMAL HIGH (ref 70–99)
Glucose-Capillary: 306 mg/dL — ABNORMAL HIGH (ref 70–99)
Glucose-Capillary: 273 mg/dL — ABNORMAL HIGH (ref 70–99)

## 2020-07-26 LAB — C-REACTIVE PROTEIN: CRP: 3.6 mg/dL — ABNORMAL HIGH (ref <1.0)

## 2020-07-26 LAB — PHOSPHORUS: Phosphorus: 2.7 mg/dL (ref 2.5–4.6)

## 2020-07-26 LAB — UREA NITROGEN, URINE: Urea Nitrogen, Ur: 821 mg/dL

## 2020-07-26 LAB — FERRITIN: Ferritin: 764 ng/mL — ABNORMAL HIGH (ref 11–307)

## 2020-07-26 MED ORDER — ALBUTEROL SULFATE HFA 108 (90 BASE) MCG/ACT IN AERS
2.0000 | INHALATION_SPRAY | Freq: Two times a day (BID) | RESPIRATORY_TRACT | Status: DC
  Administered 2020-07-26 – 2020-07-27 (×2): 2 via RESPIRATORY_TRACT

## 2020-07-26 MED ORDER — HEPARIN SODIUM (PORCINE) 5000 UNIT/ML IJ SOLN
5000.0000 [IU] | Freq: Three times a day (TID) | INTRAMUSCULAR | Status: DC
  Administered 2020-07-26 – 2020-07-27 (×2): 5000 [IU] via SUBCUTANEOUS
  Filled 2020-07-26 (×2): qty 1

## 2020-07-26 MED ORDER — PANTOPRAZOLE SODIUM 40 MG PO TBEC
40.0000 mg | DELAYED_RELEASE_TABLET | Freq: Every day | ORAL | Status: DC
  Administered 2020-07-26 – 2020-07-27 (×2): 40 mg via ORAL
  Filled 2020-07-26 (×2): qty 1

## 2020-07-26 MED ORDER — STUDY - ARCADIA - APIXABAN 5MG OR PLACEBO (PI-SETHI)
5.0000 mg | ORAL_TABLET | Freq: Two times a day (BID) | ORAL | Status: DC
  Administered 2020-07-26 – 2020-07-27 (×3): 5 mg via ORAL
  Filled 2020-07-26 (×5): qty 1

## 2020-07-26 NOTE — Evaluation (Addendum)
Physical Therapy Evaluation Patient Details Name: Heather Rogers MRN: [id number] DOB: 1945/10/15 Today's Date: 07/26/2020   History of Present Illness  Pt adm with syncope x 2 and found to have covid-19. PMH - HTN, CVA with residual short term memory loss, DM  Clinical Impression  Pt presents to PT with slightly unsteady gait due to illness and inactivity. Expect pt will make good progress back to baseline with mobility. Needs acute PT to maximize independence and safety prior to return home.  I checked orthostatics on pt and she wasn't orthostatic. See flowsheets with actual numbers. Pt denied any dizziness or light headedness.     Follow Up Recommendations Home health PT;Supervision - Intermittent    Equipment Recommendations  None recommended by PT    Recommendations for Other Services       Precautions / Restrictions Precautions Precautions: Fall      Mobility  Bed Mobility Overal bed mobility: Modified Independent                  Transfers Overall transfer level: Needs assistance Equipment used: None Transfers: Sit to/from Stand Sit to Stand: Min assist         General transfer comment: Assist for balance  Ambulation/Gait Ambulation/Gait assistance: Min assist Gait Distance (Feet): 200 Feet Assistive device: None Gait Pattern/deviations: Step-through pattern;Decreased stride length Gait velocity: decr Gait velocity interpretation: <1.31 ft/sec, indicative of household ambulator General Gait Details: Slightly unsteady gait which improved with distance  Stairs            Wheelchair Mobility    Modified Rankin (Stroke Patients Only)       Balance Overall balance assessment: Needs assistance Sitting-balance support: No upper extremity supported;Feet supported Sitting balance-Leahy Scale: Good     Standing balance support: No upper extremity supported;During functional activity Standing balance-Leahy Scale: Fair                                Pertinent Vitals/Pain Pain Assessment: No/denies pain    Home Living Family/patient expects to be discharged to:: Private residence Living Arrangements: Other relatives (mentally impaired niece) Available Help at Discharge: Family;Available PRN/intermittently (daughter comes by and checks on most days) Type of Home: House Home Access: Ramped entrance     Home Layout: One level Home Equipment: None      Prior Function Level of Independence: Independent         Comments: drives to church, daughter helps with meals     Hand Dominance   Dominant Hand: Right    Extremity/Trunk Assessment   Upper Extremity Assessment Upper Extremity Assessment: Defer to OT evaluation    Lower Extremity Assessment Lower Extremity Assessment: Generalized weakness       Communication   Communication: No difficulties  Cognition Arousal/Alertness: Awake/alert Behavior During Therapy: WFL for tasks assessed/performed Overall Cognitive Status: Within Functional Limits for tasks assessed                                 General Comments: Per chart residual short term memory loss due to CVA.      General Comments      Exercises     Assessment/Plan    PT Assessment Patient needs continued PT services  PT Problem List Decreased strength;Decreased balance;Decreased mobility;Decreased activity tolerance       PT Treatment Interventions DME instruction;Gait training;Functional mobility training;Therapeutic  activities;Therapeutic exercise;Balance training;Patient/family education    PT Goals (Current goals can be found in the Care Plan section)  Acute Rehab PT Goals Patient Stated Goal: return home PT Goal Formulation: With patient Time For Goal Achievement: 08/02/20 Potential to Achieve Goals: Good    Frequency Min 3X/week   Barriers to discharge        Co-evaluation               AM-PAC PT "6 Clicks" Mobility  Outcome Measure  Help needed turning from your back to your side while in a flat bed without using bedrails?: None Help needed moving from lying on your back to sitting on the side of a flat bed without using bedrails?: None Help needed moving to and from a bed to a chair (including a wheelchair)?: A Little Help needed standing up from a chair using your arms (e.g., wheelchair or bedside chair)?: A Little Help needed to walk in hospital room?: A Little Help needed climbing 3-5 steps with a railing? : A Little 6 Click Score: 20    End of Session   Activity Tolerance: Patient tolerated treatment well Patient left: in chair;with call bell/phone within reach;with chair alarm set;with nursing/sitter in room Nurse Communication: Mobility status;Other (comment) (IV leaking) PT Visit Diagnosis: Unsteadiness on feet (R26.81);Muscle weakness (generalized) (M62.81)    Time: [id number] PT Time Calculation (min) (ACUTE ONLY): 33 min   Charges:   PT Evaluation $PT Eval Moderate Complexity: 1 Mod PT Treatments $Gait Training: 8-22 mins        [place] Pager [phone] Office [place] 07/26/2020, 5:11 PM

## 2020-07-26 NOTE — Plan of Care (Signed)

## 2020-07-26 NOTE — Progress Notes (Signed)
Research team said that the patient is supposed to be on the apixaban 5mg /ASA/placebo arm.   , PharmD, BCIDP, AAHIVP, CPP Infectious Disease Pharmacist 07/26/2020 11:33 AM

## 2020-07-26 NOTE — Progress Notes (Signed)
PROGRESS NOTE                                                                             PROGRESS NOTE                                                                                                                                                                                                             Patient Demographics:    Heather Rogers, is a 74 y.o. female, DOB - 06-Aug-1945, [id number]  Outpatient Primary MD for the patient is Antony Blackbird, MD (Inactive)    LOS - 1  Admit date - 07/24/2020    Chief Complaint  Patient presents with  . Loss of Consciousness       Brief Narrative     HPI: Heather Rogers is a 74 y.o. female with medical history significant of hypertension, hyperlipidemia, CVA in 04/2018 with residual short term memory loss and peripheral vision loss, s/p loop recorder implantation in 04/2018, and diabetes mellitus type 2 presents with complaints of blacking out.  First time occurred 2 days ago while she was walking to the kitchen and then the second time occurred last night while sitting talk on the phone with her daughter.  She reported feeling lightheaded prior to the episodes and had vomited, but does not remember doing so.  When she awoke last night she saw a puddle of nonbloody emesis on the floor.  She is unsure of how long she was out, but does not think it was very long.  Denies sustaining any injury, palpitations, chest pain, shortness of breath, calf pain, abdominal pain, dysuria, or diarrhea.  She has never passed out before.  Patient reports that she had received her [place] vaccine in April, and had just received a Moderna booster vaccine 3 days ago.  Prior to the patient receiving her vaccine her daughter reported that she had been complaining of a scratchy throat.  Due to her sore throat she had not been eating and drinking much. She had tried  remedies at home without any relief in symptoms.  Her  daughter had taken her to be tested for COVID-19 yesterday, but results would not be available for 72 hours.  Patient possibly became sick from going to church or after going out with her relative shopping last week, but was not aware of any sick contacts to her knowledge.  ED Course: Upon admission into the emergency department patient was seen to be afebrile with vital signs relatively stable.  CT imaging of the head showed no acute abnormalities.  Labs significant for hemoglobin 10.7, BUN 39, creatinine 2.07,  glucose 149, and INR 1.6.  Group A strep negative by PCR.  COVID-19 screening was positive.  Patient has been given 1 L normal saline IV fluids.  TRH called to admit.  Chest x-ray revealed hypoinflated lungs with bibasilar opacities concerning for atelectasis versus infiltrate.    Subjective:    [name] today for generalized weakness, fatigue, cough, poor appetite and nausea .   Assessment  & Plan :    Principal Problem:   Syncope and collapse Active Problems:   Pneumonia due to COVID-19 virus   Acute renal failure (ARF) (HCC)   Dehydration   Diabetes mellitus type 2, controlled (HCC)   History of stroke with residual deficit   Dyslipidemia   Essential hypertension   Coagulopathy (HCC)  Syncope and collapse - presents after having 2 syncopal episodes at home.  -  With each episode patient reported feeling lightheaded prior to passing out.  This is most likely due to dehydration, as she reports poor oral intake. -   EKG within normal limits and she denies any complaints of chest pain.unlikely arrhythmia.  Loop recorder had recently been interrogated on 12/24 and showed no arrhythmias. -Continue with IV fluids, no significant events on telemetry.  Pneumonia due to COVID-19: -He is vaccinated with J&J in April, received Moderna booster 3 days ago(likely too late as she has been having COVID  symptoms before booster) -  Chest x-ray revealed hyperinflation with bibasilar  opacities.  -Remdesivir per pharmacy -Solu-Medrol IV -Albuterol inhaler as needed -Antitussives as needed -Vitamin C and zinc -Chloraseptic Spray  Acute renal failure:  -Volume depletion and dehydration, baseline 0.8, creatinine elevated 2.07 on admission . -Continue with IV fluids and avoid nephrotoxic medications.    Diabetes mellitus type 2, well controlled:  -  Home medications include sitagliptin-metformin 50-500 nightly with supper.  Hold on admission -A1c is 6.9 -Continue with insulin sliding scale during hospital stay -Hypoglycemic protocols -Hold home oral medication -CBGs before every meal with moderate SSI given patient started on steroids -Adjust regimen as needed  Essential hypertension: Home blood pressure medications include Norvasc 10 mg daily, Coreg 25 mg twice daily, hydrochlorothiazide 12.5 mg daily, and Diovan 320 mg daily. -Hold hydrochlorothiazide and Diovan given acute renal failure -Continue Coreg and amlodipine as tolerated  History of CVA with residual deficit: Patient had a stroke occipital stroke in 04/2018.  As result of the stroke patient has short-term memory loss and peripheral vision loss.  She sees Dr. Pearlean Brownie in outpatient setting and had been involved in a [place] study. -Continue statin and [place] study -Cussed with Dr. Pearlean Brownie , continue with [place] study medications unless there is a need for full anticoagulation  -Okay to keep patient on DVT prophylaxis with heparin or subcu Lovenox prophylaxis dose during the study as discussed with Dr. Pearlean Brownie   Dyslipidemia: Last lipid panel revealed total cholesterol 126, HDL 27, LDL 64, triglycerides 175.  Home medications atorvastatin 80 mg daily. -Continues statin  Coagulopathy: Acute.  -  INR noted to be 1.6 on admission.  Suspect this likely to be related with the [place] study.   SpO2: 100 %  Recent Labs  Lab 07/24/20 2222 07/25/20 0546 07/25/20 0806 07/25/20 0847 07/26/20 0232  WBC  8.1  --   --   --  3.4*  PLT 245  --   --   --  218  CRP  --   --   --  6.5* 3.6*  BNP  --   --  351.6*  --   --   DDIMER  --   --   --  0.97* 1.01*  PROCALCITON  --   --   --  0.70  --   AST 21  --   --   --  32  ALT 16  --   --   --  24  ALKPHOS 52  --   --   --  52  BILITOT 0.6  --   --   --  0.2*  ALBUMIN 3.6  --   --   --  3.2*  INR 1.6*  --   --   --   --   SARSCOV2NAA  --  POSITIVE*  --   --   --        ABG     Component Value Date/Time   TCO2 20 (L) 05/15/2018 0856         Condition - Extremely Guarded  Family Communication  :  Daughter by phone  Code Status :  Full  Consults  :  none  Procedures  :  none  PUD Prophylaxis : protonix  Disposition Plan  :    Status is: Inpatient  Remains inpatient appropriate because:IV treatments appropriate due to intensity of illness or inability to take PO   Dispo: The patient is from: Home              Anticipated d/c is to: Home              Anticipated d/c date is: 3 days              Patient currently is not medically stable to d/c.      DVT Prophylaxis  :   Heparin   Lab Results  Component Value Date   PLT 218 07/26/2020    Diet :  Diet Order            Diet Carb Modified Fluid consistency: Thin; Room service appropriate? Yes  Diet effective now                  Inpatient Medications  Scheduled Meds: . albuterol  2 puff Inhalation BID  . amLODipine  10 mg Oral Daily  . vitamin C  500 mg Oral Daily  . atorvastatin  80 mg Oral Daily  . carvedilol  25 mg Oral BID WC  . gabapentin  100 mg Oral TID  . insulin aspart  0-15 Units Subcutaneous TID WC  . insulin aspart  0-5 Units Subcutaneous QHS  . methylPREDNISolone (SOLU-MEDROL) injection  0.5 mg/kg Intravenous Q12H   Followed by  . [START ON 07/28/2020] predniSONE  50 mg Oral Daily  . sodium chloride flush  3 mL Intravenous Q12H  . STUDY - [place] - apixaban 5mg  or placebo (PI - Sethi)  5 mg Oral BID  . STUDY - [place] - aspirin 81mg   or  placebo (PI - Sethi)  81 mg Oral Q24H  . zinc sulfate  220 mg Oral Daily   Continuous Infusions: . sodium chloride Stopped (07/25/20 2129)  . remdesivir 100 mg in NS 100 mL 100 mg (07/26/20 0959)   PRN Meds:.acetaminophen, chlorpheniramine-HYDROcodone, guaiFENesin-dextromethorphan, hydrALAZINE, phenol  Antibiotics  :    Anti-infectives (From admission, onward)   Start     Dose/Rate Route Frequency Ordered Stop   07/26/20 1000  remdesivir 100 mg in sodium chloride 0.9 % 100 mL IVPB       "Followed by" Linked Group Details   100 mg 200 mL/hr over 30 Minutes Intravenous Daily 07/25/20 0750 07/30/20 0959   07/25/20 0900  remdesivir 200 mg in sodium chloride 0.9% 250 mL IVPB       "Followed by" Linked Group Details   200 mg 580 mL/hr over 30 Minutes Intravenous Once 07/25/20 0750 07/25/20 1046        Leonetta Mcgivern M.D on 07/26/2020 at 3:10 PM  To page go to [email or web address] [facility] -  Office  [phone]      Objective:   Vitals:   07/25/20 2359 07/26/20 0422 07/26/20 0818 07/26/20 1159  BP: 138/65 (!) 143/60 (!) 149/63 (!) 132/55  Pulse: 83 73 76 79  Resp: 14 19 13 19   Temp: 98.4 F (36.9 C) 98.5 F (36.9 C) 98.4 F (36.9 C) 98.3 F (36.8 C)  TempSrc: Oral Oral Oral Oral  SpO2: 100% 100% 100% 100%  Weight:      Height:        Wt Readings from Last 3 Encounters:  07/24/20 70.3 kg  04/11/20 71.9 kg  08/28/19 72.6 kg     Intake/Output Summary (Last 24 hours) at 07/26/2020 1510 Last data filed at 07/26/2020 1100 Gross per 24 hour  Intake 1350 ml  Output --  Net 1350 ml     Physical Exam  Awake Alert, No new F.N deficits, Normal affect Symmetrical Chest wall movement, Good air movement bilaterally, CTAB RRR,No Gallops,Rubs or new Murmurs, No Parasternal Heave +ve B.Sounds, Abd Soft, No tenderness, No rebound - guarding or rigidity. No Cyanosis, Clubbing or edema, No new Rash or bruise      Data Review:    CBC Recent Labs  Lab  07/24/20 2222 07/26/20 0232  WBC 8.1 3.4*  HGB 10.7* 10.3*  HCT 35.3* 31.7*  PLT 245 218  MCV 73.2* 70.6*  MCH 22.2* 22.9*  MCHC 30.3 32.5  RDW 14.6 14.4  LYMPHSABS 1.5 0.6*  MONOABS 1.0 0.3  EOSABS 0.0 0.0  BASOSABS 0.0 0.0    Recent Labs  Lab 07/24/20 2222 07/25/20 0806 07/25/20 0847 07/26/20 0232  NA 135  --   --  137  K 3.8  --   --  4.0  CL 101  --   --  106  CO2 24  --   --  21*  GLUCOSE 149*  --   --  181*  BUN 39*  --   --  37*  CREATININE 2.07*  --   --  1.16*  CALCIUM 8.9  --   --  9.0  AST 21  --   --  32  ALT 16  --   --  24  ALKPHOS 52  --   --  52  BILITOT 0.6  --   --  0.2*  ALBUMIN 3.6  --   --  3.2*  MG  --   --   --  2.4  CRP  --   --  6.5* 3.6*  DDIMER  --   --  0.97* 1.01*  PROCALCITON  --   --  0.70  --   INR 1.6*  --   --   --   HGBA1C  --  6.9*  --   --   BNP  --  351.6*  --   --     ------------------------------------------------------------------------------------------------------------------ No results for input(s): CHOL, HDL, LDLCALC, TRIG, CHOLHDL, LDLDIRECT in the last 72 hours.  Lab Results  Component Value Date   HGBA1C 6.9 (H) 07/25/2020   ------------------------------------------------------------------------------------------------------------------ No results for input(s): TSH, T4TOTAL, T3FREE, THYROIDAB in the last 72 hours.  Invalid input(s): FREET3  Cardiac Enzymes No results for input(s): CKMB, TROPONINI, MYOGLOBIN in the last 168 hours.  Invalid input(s): CK ------------------------------------------------------------------------------------------------------------------    Component Value Date/Time   BNP 351.6 (H) 07/25/2020 9604    Micro Results Recent Results (from the past 240 hour(s))  Group A Strep by PCR     Status: None   Collection Time: 07/25/20  5:46 AM   Specimen: Nasopharyngeal Swab; Sterile Swab  Result Value Ref Range Status   Group A Strep by PCR NOT DETECTED NOT DETECTED Final     Comment: Performed at [facility], [address] [place], [place] [address]  Resp Panel by RT-PCR (Flu A&B, Covid) Nasopharyngeal Swab     Status: Abnormal   Collection Time: 07/25/20  5:46 AM   Specimen: Nasopharyngeal Swab; Nasopharyngeal(NP) swabs in vial transport medium  Result Value Ref Range Status   SARS Coronavirus 2 by RT PCR POSITIVE (A) NEGATIVE Final    Comment: RESULT CALLED TO, READ BACK BY AND VERIFIED WITH: K. MOON RN, AT 1191 07/25/20 BY D. VANHOOK (NOTE) SARS-CoV-2 target nucleic acids are DETECTED.  The SARS-CoV-2 RNA is generally detectable in upper respiratory specimens during the acute phase of infection. Positive results are indicative of the presence of the identified virus, but do not rule out bacterial infection or co-infection with other pathogens not detected by the test. Clinical correlation with patient history and other diagnostic information is necessary to determine patient infection status. The expected result is Negative.  Fact Sheet for Patients: BloggerCourse.com  Fact Sheet for Healthcare Providers: SeriousBroker.it  This test is not yet approved or cleared by the [place] FDA and  has been authorized for detection and/or diagnosis of SARS-CoV-2 by FDA under an Emergency Use Authorization (EUA).  This EUA will remain in effect (meaning this te st can be used) for the duration of  the COVID-19 declaration under Section 564(b)(1) of the Act, 21 U.S.C. section 360bbb-3(b)(1), unless the authorization is terminated or revoked sooner.     Influenza A by PCR NEGATIVE NEGATIVE Final   Influenza B by PCR NEGATIVE NEGATIVE Final    Comment: (NOTE) The Xpert Xpress SARS-CoV-2/FLU/RSV plus assay is intended as an aid in the diagnosis of influenza from Nasopharyngeal swab specimens and should not be used as a sole basis for treatment. Nasal washings and aspirates are unacceptable for  Xpert Xpress SARS-CoV-2/FLU/RSV testing.  Fact Sheet for Patients: BloggerCourse.com  Fact Sheet for Healthcare Providers: SeriousBroker.it  This test is not yet approved or cleared by the [place] FDA and has been authorized for detection and/or diagnosis of SARS-CoV-2 by FDA under an Emergency Use Authorization (EUA). This EUA will remain in effect (meaning this test can be used) for the duration of the COVID-19 declaration under Section 564(b)(1) of the Act, 21 U.S.C. section 360bbb-3(b)(1), unless the authorization is  terminated or revoked.  Performed at [facility], [address] [place], [place] [address]     Radiology Reports CT HEAD WO CONTRAST  Result Date: 07/24/2020 CLINICAL DATA:  Dizziness syncopal episodes EXAM: CT HEAD WITHOUT CONTRAST TECHNIQUE: Contiguous axial images were obtained from the base of the skull through the vertex without intravenous contrast. COMPARISON:  CT 05/15/2018 FINDINGS: Brain: Region of encephalomalacia involving the left occipital lobe compatible with expected evolution of a previously seen infarct. Stable remote left thalamic lacunar infarct. No evidence of new acute CT evident cortically based or vascular territory infarction, hemorrhage, hydrocephalus, extra-axial collection, visible mass lesion or mass effect. Symmetric prominence of the ventricles, cisterns and sulci compatible with parenchymal volume loss. Additional patchy and confluent areas of white matter hypoattenuation are most compatible with chronic microvascular angiopathy. Stable dural calcifications and tiny para falcine lipoma. Vascular: Atherosclerotic calcification of the carotid siphons. No hyperdense vessel. Skull: No calvarial fracture or suspicious osseous lesion. No scalp swelling or hematoma. Sinuses/Orbits: Paranasal sinuses and mastoid air cells are predominantly clear aside from some minimal mural thickening  in the ethmoids. Orbital structures are unremarkable aside from prior lens extractions. Other: None. IMPRESSION: 1. No acute intracranial abnormality. 2. Encephalomalacia from remote infarct of the left occipital lobe. Additional remote left thalamic lacunar infarct. 3. Stable parenchymal volume loss and chronic microvascular angiopathy. Electronically Signed   By: Kreg Shropshire M.D.   On: 07/24/2020 22:52   DG CHEST PORT 1 VIEW  Result Date: 07/25/2020 CLINICAL DATA:  COVID-19 EXAM: PORTABLE CHEST 1 VIEW COMPARISON:  12/20/2009. FINDINGS: Mild hypoinflation. Hazy bibasilar opacities. No pneumothorax or pleural effusion. Cardiomediastinal silhouette within normal limits. Indwelling loop recorder. IMPRESSION: Hypoinflated lungs with bibasilar opacities, atelectasis versus infiltrate. Electronically Signed   By: Stana Bunting M.D.   On: 07/25/2020 08:20   CUP PACEART REMOTE DEVICE CHECK  Result Date: 07/25/2020 ILR summary report received. Battery status OK. Normal device function. No new symptom, tachy, brady, or pause episodes. No new AF episodes. Monthly summary reports and ROV/PRN

## 2020-07-27 LAB — CBC WITH DIFFERENTIAL/PLATELET
Abs Immature Granulocytes: 0.03 10*3/uL (ref 0.00–0.07)
Basophils Absolute: 0 10*3/uL (ref 0.0–0.1)
Basophils Relative: 0 %
Eosinophils Absolute: 0 10*3/uL (ref 0.0–0.5)
Eosinophils Relative: 0 %
HCT: 31.3 % — ABNORMAL LOW (ref 36.0–46.0)
Hemoglobin: 10.3 g/dL — ABNORMAL LOW (ref 12.0–15.0)
Immature Granulocytes: 1 %
Lymphocytes Relative: 12 %
Lymphs Abs: 0.7 10*3/uL (ref 0.7–4.0)
MCH: 23 pg — ABNORMAL LOW (ref 26.0–34.0)
MCHC: 32.9 g/dL (ref 30.0–36.0)
MCV: 70 fL — ABNORMAL LOW (ref 80.0–100.0)
Monocytes Absolute: 0.5 10*3/uL (ref 0.1–1.0)
Monocytes Relative: 7 %
Neutro Abs: 5 10*3/uL (ref 1.7–7.7)
Neutrophils Relative %: 80 %
Platelets: 259 10*3/uL (ref 150–400)
RBC: 4.47 MIL/uL (ref 3.87–5.11)
RDW: 14.3 % (ref 11.5–15.5)
WBC: 6.3 10*3/uL (ref 4.0–10.5)
nRBC: 0 % (ref 0.0–0.2)

## 2020-07-27 LAB — COMPREHENSIVE METABOLIC PANEL
ALT: 34 U/L (ref 0–44)
AST: 32 U/L (ref 15–41)
Albumin: 3.1 g/dL — ABNORMAL LOW (ref 3.5–5.0)
Alkaline Phosphatase: 52 U/L (ref 38–126)
Anion gap: 10 (ref 5–15)
BUN: 35 mg/dL — ABNORMAL HIGH (ref 8–23)
CO2: 21 mmol/L — ABNORMAL LOW (ref 22–32)
Calcium: 8.8 mg/dL — ABNORMAL LOW (ref 8.9–10.3)
Chloride: 106 mmol/L (ref 98–111)
Creatinine, Ser: 1.17 mg/dL — ABNORMAL HIGH (ref 0.44–1.00)
GFR, Estimated: 49 mL/min — ABNORMAL LOW (ref >60)
Glucose, Bld: 226 mg/dL — ABNORMAL HIGH (ref 70–99)
Potassium: 4.4 mmol/L (ref 3.5–5.1)
Sodium: 137 mmol/L (ref 135–145)
Total Bilirubin: 0.3 mg/dL (ref 0.3–1.2)
Total Protein: 7.1 g/dL (ref 6.5–8.1)

## 2020-07-27 LAB — GLUCOSE, CAPILLARY
Glucose-Capillary: 223 mg/dL — ABNORMAL HIGH (ref 70–99)
Glucose-Capillary: 263 mg/dL — ABNORMAL HIGH (ref 70–99)

## 2020-07-27 LAB — C-REACTIVE PROTEIN: CRP: 1.5 mg/dL — ABNORMAL HIGH (ref <1.0)

## 2020-07-27 LAB — FERRITIN: Ferritin: 799 ng/mL — ABNORMAL HIGH (ref 11–307)

## 2020-07-27 LAB — MAGNESIUM: Magnesium: 2.5 mg/dL — ABNORMAL HIGH (ref 1.7–2.4)

## 2020-07-27 LAB — D-DIMER, QUANTITATIVE: D-Dimer, Quant: 0.63 ug/mL-FEU — ABNORMAL HIGH (ref 0.00–0.50)

## 2020-07-27 LAB — PHOSPHORUS: Phosphorus: 2.7 mg/dL (ref 2.5–4.6)

## 2020-07-27 MED ORDER — PREDNISONE 10 MG PO TABS: ORAL_TABLET | ORAL | 0 refills | Status: DC

## 2020-07-27 MED ORDER — BENZONATATE 100 MG PO CAPS: 100.0000 mg | ORAL_CAPSULE | Freq: Four times a day (QID) | ORAL | 0 refills | Status: DC | PRN

## 2020-07-27 NOTE — Discharge Instructions (Signed)
You are scheduled for an outpatient Remdesivir infusion at 12pm on Thursday 12/30 and Friday 12/31 at [facility]. Please park at [place], as staff will be escorting you through the [place] entrance of the hospital. Appointments take approximately 45 minutes.    The address for the infusion clinic site is:  --GPS address is [address] - the parking is located near [employer] building where you will see  COVID19 Infusion feather banner marking the entrance to parking.   (see photos below)            --Enter into the 2nd entrance where the "wave, flag banner" is at the road. Turn into this 2nd entrance and immediately turn left to park in 1 of the 5 parking spots.   --Please stay in your car and call the desk for assistance inside [phone].   The day of your visit you should: Marland Kitchen Get plenty of rest the night before and drink plenty of water . Eat a light meal/snack before coming and take your medications as prescribed  . Wear warm, comfortable clothes with a shirt that can roll-up over the elbow (will need IV start).  . Wear a mask  . Consider bringing some activity to help pass the time      Person Under Monitoring Name: Heather Rogers  Location: [place] [address]   Infection Prevention Recommendations for Individuals Confirmed to have, or Being Evaluated for, 2019 Novel Coronavirus (COVID-19) Infection Who Receive Care at Home  Individuals who are confirmed to have, or are being evaluated for, COVID-19 should follow the prevention steps below until a healthcare provider or local or state health department says they can return to normal activities.  Stay home except to get medical care You should restrict activities outside your home, except for getting medical care. Do not go to work, school, or public areas, and do not use public transportation or taxis.  Call ahead before visiting your doctor Before your medical  appointment, call the healthcare provider and tell them that you have, or are being evaluated for, COVID-19 infection. This will help the healthcare provider's office take steps to keep other people from getting infected. Ask your healthcare provider to call the local or state health department.  Monitor your symptoms Seek prompt medical attention if your illness is worsening (e.g., difficulty breathing). Before going to your medical appointment, call the healthcare provider and tell them that you have, or are being evaluated for, COVID-19 infection. Ask your healthcare provider to call the local or state health department.  Wear a facemask You should wear a facemask that covers your nose and mouth when you are in the same room with other people and when you visit a healthcare provider. People who live with or visit you should also wear a facemask while they are in the same room with you.  Separate yourself from other people in your home As much as possible, you should stay in a different room from other people in your home. Also, you should use a separate bathroom, if available.  Avoid sharing household items You should not share dishes, drinking glasses, cups, eating utensils, towels, bedding, or other items with other people in your home. After using these items, you should wash them thoroughly with soap and water.  Cover your coughs and sneezes Cover your mouth and nose with a tissue when you cough or sneeze, or you can cough or sneeze into  your sleeve. Throw used tissues in a lined trash can, and immediately wash your hands with soap and water for at least 20 seconds or use an alcohol-based hand rub.  Wash your [employer] your hands often and thoroughly with soap and water for at least 20 seconds. You can use an alcohol-based hand sanitizer if soap and water are not available and if your hands are not visibly dirty. Avoid touching your eyes, nose, and mouth with unwashed  hands.   Prevention Steps for Caregivers and Household Members of Individuals Confirmed to have, or Being Evaluated for, COVID-19 Infection Being Cared for in the Home  If you live with, or provide care at home for, a person confirmed to have, or being evaluated for, COVID-19 infection please follow these guidelines to prevent infection:  Follow healthcare provider's instructions Make sure that you understand and can help the patient follow any healthcare provider instructions for all care.  Provide for the patient's basic needs You should help the patient with basic needs in the home and provide support for getting groceries, prescriptions, and other personal needs.  Monitor the patient's symptoms If they are getting sicker, call his or her medical provider and tell them that the patient has, or is being evaluated for, COVID-19 infection. This will help the healthcare provider's office take steps to keep other people from getting infected. Ask the healthcare provider to call the local or state health department.  Limit the number of people who have contact with the patient  If possible, have only one caregiver for the patient.  Other household members should stay in another home or place of residence. If this is not possible, they should stay  in another room, or be separated from the patient as much as possible. Use a separate bathroom, if available.  Restrict visitors who do not have an essential need to be in the home.  Keep older adults, very young children, and other sick people away from the patient Keep older adults, very young children, and those who have compromised immune systems or chronic health conditions away from the patient. This includes people with chronic heart, lung, or kidney conditions, diabetes, and cancer.  Ensure good ventilation Make sure that shared spaces in the home have good air flow, such as from an air conditioner or an opened window, weather  permitting.  Wash your hands often  Wash your hands often and thoroughly with soap and water for at least 20 seconds. You can use an alcohol based hand sanitizer if soap and water are not available and if your hands are not visibly dirty.  Avoid touching your eyes, nose, and mouth with unwashed hands.  Use disposable paper towels to dry your hands. If not available, use dedicated cloth towels and replace them when they become wet.  Wear a facemask and gloves  Wear a disposable facemask at all times in the room and gloves when you touch or have contact with the patient's blood, body fluids, and/or secretions or excretions, such as sweat, saliva, sputum, nasal mucus, vomit, urine, or feces.  Ensure the mask fits over your nose and mouth tightly, and do not touch it during use.  Throw out disposable facemasks and gloves after using them. Do not reuse.  Wash your hands immediately after removing your facemask and gloves.  If your personal clothing becomes contaminated, carefully remove clothing and launder. Wash your hands after handling contaminated clothing.  Place all used disposable facemasks, gloves, and other waste in a  lined container before disposing them with other household waste.  Remove gloves and wash your hands immediately after handling these items.  Do not share dishes, glasses, or other household items with the patient  Avoid sharing household items. You should not share dishes, drinking glasses, cups, eating utensils, towels, bedding, or other items with a patient who is confirmed to have, or being evaluated for, COVID-19 infection.  After the person uses these items, you should wash them thoroughly with soap and water.  Wash laundry thoroughly  Immediately remove and wash clothes or bedding that have blood, body fluids, and/or secretions or excretions, such as sweat, saliva, sputum, nasal mucus, vomit, urine, or feces, on them.  Wear gloves when handling laundry from  the patient.  Read and follow directions on labels of laundry or clothing items and detergent. In general, wash and dry with the warmest temperatures recommended on the label.  Clean all areas the individual has used often  Clean all touchable surfaces, such as counters, tabletops, doorknobs, bathroom fixtures, toilets, phones, keyboards, tablets, and bedside tables, every day. Also, clean any surfaces that may have blood, body fluids, and/or secretions or excretions on them.  Wear gloves when cleaning surfaces the patient has come in contact with.  Use a diluted bleach solution (e.g., dilute bleach with 1 part bleach and 10 parts water) or a household disinfectant with a label that says EPA-registered for coronaviruses. To make a bleach solution at home, add 1 tablespoon of bleach to 1 quart (4 cups) of water. For a larger supply, add  cup of bleach to 1 gallon (16 cups) of water.  Read labels of cleaning products and follow recommendations provided on product labels. Labels contain instructions for safe and effective use of the cleaning product including precautions you should take when applying the product, such as wearing gloves or eye protection and making sure you have good ventilation during use of the product.  Remove gloves and wash hands immediately after cleaning.  Monitor yourself for signs and symptoms of illness Caregivers and household members are considered close contacts, should monitor their health, and will be asked to limit movement outside of the home to the extent possible. Follow the monitoring steps for close contacts listed on the symptom monitoring form.   ? If you have additional questions, contact your local health department or call the epidemiologist on call at [phone] (available 24/7). ? This guidance is subject to change. For the most up-to-date guidance from [facility], please refer to their  website: [email or web address]

## 2020-07-27 NOTE — Plan of Care (Signed)
Patient is currently resting in bed. Denies pain. VSS. Remains on room air. OOB to [facility]. Call bell within reach. All needs met at this time.   Problem: Education: Goal: Knowledge of risk factors and measures for prevention of condition will improve Outcome: Progressing   Problem: Coping: Goal: Psychosocial and spiritual needs will be supported Outcome: Progressing   Problem: Respiratory: Goal: Will maintain a patent airway Outcome: Progressing Goal: Complications related to the disease process, condition or treatment will be avoided or minimized Outcome: Progressing

## 2020-07-27 NOTE — Discharge Summary (Signed)
PATIENT DETAILS Name: Heather Rogers Age: 74 y.o. Sex: female Date of Birth: Jan 28, 1946 MRN: [id number]. Admitting Physician: Norval Morton, MD DGL:OVFI, Ander Gaster, MD (Inactive)  Admit Date: 07/24/2020 Discharge date: 07/27/2020  Recommendations for Outpatient Follow-up:  1. Follow up with PCP in 1-2 weeks 2. Please obtain CMP/CBC in one week 3. Repeat Chest Xray in 4-6 week  Admitted From:  Home   Disposition: Home with home health [place]: Yes  Equipment/Devices: None  Discharge Condition: Stable  CODE STATUS: FULL CODE  Diet recommendation:  Diet Order            Diet - low sodium heart healthy           Diet Carb Modified           Diet Carb Modified Fluid consistency: Thin; Room service appropriate? Yes  Diet effective now                  Brief Summary: See H&P, Labs, Consult and Test reports for all details in brief, patient is a 74 year old female with history of CVA with residual short-term memory loss, HTN, HLD, DM-2-who presented with 2 episodes of syncope.  Prior to this hospital stay-patient was complaining of scratchy/itchy throat, malaise, cough, poor oral intake and had a few episodes of vomiting.  Upon evaluation in the emergency room-she was found to have COVID-19 infection, AKI-and was subsequently admitted for further inpatient treatment and evaluation.  See below for further details.  Brief Hospital Course: Syncope: High suspicion for orthostatic mechanism-given AKI/vomiting and poor oral intake.  Feels much better-ambulated by physical therapy yesterday-orthostatic vital signs were negative.  Patient has a loop recorder in place-Per prior notes-no arrhythmias.  Telemetry was negative for arrhythmias as well.  Stable for discharge.  Pneumonia due to COVID-19: Not hypoxic-treated with steroid/Remdesivir-we will continue Remdesivir infusion in the outpatient setting.  Will taper off steroids over the next few days.   Patient is s/p vaccination with J&J in April-received Moderna booster 3 days prior to this hospitalization.  COVID-19 Labs:  Recent Labs    07/25/20 0847 07/26/20 0232 07/27/20 0151  DDIMER 0.97* 1.01* 0.63*  FERRITIN 753* 764* 799*  LDH 171  --   --   CRP 6.5* 3.6* 1.5*    Lab Results  Component Value Date   SARSCOV2NAA POSITIVE (A) 07/25/2020    AKI: Hemodynamically mediated-improved with IV fluids.  Creatinine has normalized.  DM-2: Mild hyperglycemia due to steroids-we will transition back to usual oral hypoglycemic regimen on discharge.  HTN: BP stable-resume usual antihypertensive regimen on discharge.  CVA with residual short-term memory deficits/peripheral vision loss: Continue statin and [place] study medication.  HLD: Continue statin   Discharge Diagnoses:  Principal Problem:   Syncope and collapse Active Problems:   Pneumonia due to COVID-19 virus   Acute renal failure (ARF) (HCC)   Dehydration   Diabetes mellitus type 2, controlled ([place])   History of stroke with residual deficit   Dyslipidemia   Essential hypertension   Coagulopathy [facility])   Discharge Instructions:    Person Under Monitoring Name: Heather Rogers  Location: [address] [place] [address]   Infection Prevention Recommendations for Individuals Confirmed to have, or Being Evaluated for, 2019 Novel Coronavirus (COVID-19) Infection Who Receive Care at Home  Individuals who are confirmed to have, or are being evaluated for, COVID-19 should follow the prevention steps below until a healthcare provider or local or state health department says  they can return to normal activities.  Stay home except to get medical care You should restrict activities outside your home, except for getting medical care. Do not go to work, school, or public areas, and do not use public transportation or taxis.  Call ahead before visiting your doctor Before your medical appointment, call the  healthcare provider and tell them that you have, or are being evaluated for, COVID-19 infection. This will help the healthcare provider's office take steps to keep other people from getting infected. Ask your healthcare provider to call the local or state health department.  Monitor your symptoms Seek prompt medical attention if your illness is worsening (e.g., difficulty breathing). Before going to your medical appointment, call the healthcare provider and tell them that you have, or are being evaluated for, COVID-19 infection. Ask your healthcare provider to call the local or state health department.  Wear a facemask You should wear a facemask that covers your nose and mouth when you are in the same room with other people and when you visit a healthcare provider. People who live with or visit you should also wear a facemask while they are in the same room with you.  Separate yourself from other people in your home As much as possible, you should stay in a different room from other people in your home. Also, you should use a separate bathroom, if available.  Avoid sharing household items You should not share dishes, drinking glasses, cups, eating utensils, towels, bedding, or other items with other people in your home. After using these items, you should wash them thoroughly with soap and water.  Cover your coughs and sneezes Cover your mouth and nose with a tissue when you cough or sneeze, or you can cough or sneeze into your sleeve. Throw used tissues in a lined trash can, and immediately wash your hands with soap and water for at least 20 seconds or use an alcohol-based hand rub.  Wash your [facility] your hands often and thoroughly with soap and water for at least 20 seconds. You can use an alcohol-based hand sanitizer if soap and water are not available and if your hands are not visibly dirty. Avoid touching your eyes, nose, and mouth with unwashed hands.   Prevention Steps for  Caregivers and Household Members of Individuals Confirmed to have, or Being Evaluated for, COVID-19 Infection Being Cared for in the Home  If you live with, or provide care at home for, a person confirmed to have, or being evaluated for, COVID-19 infection please follow these guidelines to prevent infection:  Follow healthcare provider's instructions Make sure that you understand and can help the patient follow any healthcare provider instructions for all care.  Provide for the patient's basic needs You should help the patient with basic needs in the home and provide support for getting groceries, prescriptions, and other personal needs.  Monitor the patient's symptoms If they are getting sicker, call his or her medical provider and tell them that the patient has, or is being evaluated for, COVID-19 infection. This will help the healthcare provider's office take steps to keep other people from getting infected. Ask the healthcare provider to call the local or state health department.  Limit the number of people who have contact with the patient  If possible, have only one caregiver for the patient.  Other household members should stay in another home or place of residence. If this is not possible, they should stay  in another room, or be  separated from the patient as much as possible. Use a separate bathroom, if available.  Restrict visitors who do not have an essential need to be in the home.  Keep older adults, very young children, and other sick people away from the patient Keep older adults, very young children, and those who have compromised immune systems or chronic health conditions away from the patient. This includes people with chronic heart, lung, or kidney conditions, diabetes, and cancer.  Ensure good ventilation Make sure that shared spaces in the home have good air flow, such as from an air conditioner or an opened window, weather permitting.  Wash your hands  often  Wash your hands often and thoroughly with soap and water for at least 20 seconds. You can use an alcohol based hand sanitizer if soap and water are not available and if your hands are not visibly dirty.  Avoid touching your eyes, nose, and mouth with unwashed hands.  Use disposable paper towels to dry your hands. If not available, use dedicated cloth towels and replace them when they become wet.  Wear a facemask and gloves  Wear a disposable facemask at all times in the room and gloves when you touch or have contact with the patient's blood, body fluids, and/or secretions or excretions, such as sweat, saliva, sputum, nasal mucus, vomit, urine, or feces.  Ensure the mask fits over your nose and mouth tightly, and do not touch it during use.  Throw out disposable facemasks and gloves after using them. Do not reuse.  Wash your hands immediately after removing your facemask and gloves.  If your personal clothing becomes contaminated, carefully remove clothing and launder. Wash your hands after handling contaminated clothing.  Place all used disposable facemasks, gloves, and other waste in a lined container before disposing them with other household waste.  Remove gloves and wash your hands immediately after handling these items.  Do not share dishes, glasses, or other household items with the patient  Avoid sharing household items. You should not share dishes, drinking glasses, cups, eating utensils, towels, bedding, or other items with a patient who is confirmed to have, or being evaluated for, COVID-19 infection.  After the person uses these items, you should wash them thoroughly with soap and water.  Wash laundry thoroughly  Immediately remove and wash clothes or bedding that have blood, body fluids, and/or secretions or excretions, such as sweat, saliva, sputum, nasal mucus, vomit, urine, or feces, on them.  Wear gloves when handling laundry from the patient.  Read and follow  directions on labels of laundry or clothing items and detergent. In general, wash and dry with the warmest temperatures recommended on the label.  Clean all areas the individual has used often  Clean all touchable surfaces, such as counters, tabletops, doorknobs, bathroom fixtures, toilets, phones, keyboards, tablets, and bedside tables, every day. Also, clean any surfaces that may have blood, body fluids, and/or secretions or excretions on them.  Wear gloves when cleaning surfaces the patient has come in contact with.  Use a diluted bleach solution (e.g., dilute bleach with 1 part bleach and 10 parts water) or a household disinfectant with a label that says EPA-registered for coronaviruses. To make a bleach solution at home, add 1 tablespoon of bleach to 1 quart (4 cups) of water. For a larger supply, add  cup of bleach to 1 gallon (16 cups) of water.  Read labels of cleaning products and follow recommendations provided on product labels. Labels contain instructions for safe  and effective use of the cleaning product including precautions you should take when applying the product, such as wearing gloves or eye protection and making sure you have good ventilation during use of the product.  Remove gloves and wash hands immediately after cleaning.  Monitor yourself for signs and symptoms of illness Caregivers and household members are considered close contacts, should monitor their health, and will be asked to limit movement outside of the home to the extent possible. Follow the monitoring steps for close contacts listed on the symptom monitoring form.   ? If you have additional questions, contact your local health department or call the epidemiologist on call at [phone] (available 24/7). ? This guidance is subject to change. For the most up-to-date guidance from CDC, please refer to their website: YouBlogs.pl    Activity:   As tolerated    Discharge Instructions    Call MD for:  difficulty breathing, headache or visual disturbances   Complete by: As directed    Call MD for:  extreme fatigue   Complete by: As directed    Call MD for:  persistant dizziness or light-headedness   Complete by: As directed    Diet - low sodium heart healthy   Complete by: As directed    Diet Carb Modified   Complete by: As directed    Discharge instructions   Complete by: As directed    Follow with Primary MD  Chapman Fitch, Cammie, MD (Inactive) in 1-2 weeks  Please get a complete blood count and chemistry panel checked by your Primary MD at your next visit, and again as instructed by your Primary MD.  Get Medicines reviewed and adjusted: Please take all your medications with you for your next visit with your Primary MD  Laboratory/radiological data: Please request your Primary MD to go over all hospital tests and procedure/radiological results at the follow up, please ask your Primary MD to get all Hospital records sent to his/her office.  In some cases, they will be blood work, cultures and biopsy results pending at the time of your discharge. Please request that your primary care M.D. follows up on these results.  Also Note the following: If you experience worsening of your admission symptoms, develop shortness of breath, life threatening emergency, suicidal or homicidal thoughts you must seek medical attention immediately by calling 911 or calling your MD immediately  if symptoms less severe.  You must read complete instructions/literature along with all the possible adverse reactions/side effects for all the Medicines you take and that have been prescribed to you. Take any new Medicines after you have completely understood and accpet all the possible adverse reactions/side effects.   Do not drive when taking Pain medications or sleeping medications (Benzodaizepines)  Do not take more than prescribed Pain, Sleep and Anxiety  Medications. It is not advisable to combine anxiety,sleep and pain medications without talking with your primary care practitioner  Special Instructions: If you have smoked or chewed Tobacco  in the last 2 yrs please stop smoking, stop any regular Alcohol  and or any Recreational drug use.  Wear Seat belts while driving.  Please note: You were cared for by a hospitalist during your hospital stay. Once you are discharged, your primary care physician will handle any further medical issues. Please note that NO REFILLS for any discharge medications will be authorized once you are discharged, as it is imperative that you return to your primary care physician (or establish a relationship with a primary care physician if  you do not have one) for your post hospital discharge needs so that they can reassess your need for medications and monitor your lab values.   1.)  21 days of isolation from first day of your symptoms or from the day of your first positive Covid test.  2.) You have scheduled for outpatient Remdesivir infusions at 12pm on Wednesday 12/30 and Thursday 12/31 at [facility]. Please inform the patient to park [place], [place], as staff will be escorting the patient through the [place] entrance of the hospital.Appointments take approximately 45 minutes.   There is a wave flag banner located near the entrance on [address]. Turn into this entranceand immediatelyturn left or right and park in 1 of the 10 designated Covid Infusion Parking spots. There is a phone number on the sign, please call and let the staff know what spot you are in and we will come out and get you. For questions call [phone].   Increase activity slowly   Complete by: As directed      Allergies as of 07/27/2020      Reactions   Sulfa Antibiotics    Itching      Medication List    TAKE these medications   Accu-Chek FastClix Lancets Misc Check blood sugar fasting and before meals and again  if pt feels bad (symptoms of hypo).   Accu-Chek Guide test strip Generic drug: glucose blood CHECK BLOOD SUGAR FASTING AND BEFORE MEALS AND AGAIN IF PATIENT FEELS BAD (SYMPTOMS OF HYPO).   Accu-Chek Guide w/Device Kit 1 each by Does not apply route as directed.   allopurinol 100 MG tablet Commonly known as: ZYLOPRIM Take 1 tablet (100 mg total) by mouth daily.   amLODipine 10 MG tablet Commonly known as: NORVASC Take 1 tablet (10 mg total) by mouth daily. To lower blood pressure   atorvastatin 80 MG tablet Commonly known as: LIPITOR One pill by mouth in the evening to lower cholesterol   benzonatate 100 MG capsule Commonly known as: Tessalon Perles Take 1 capsule (100 mg total) by mouth every 6 (six) hours as needed for cough.   carvedilol 25 MG tablet Commonly known as: COREG Take 1 tablet (25 mg total) by mouth 2 (two) times daily with a meal.   cholecalciferol 25 MCG (1000 UNIT) tablet Commonly known as: VITAMIN D3 Take 1,000 Units by mouth daily.   gabapentin 100 MG capsule Commonly known as: NEURONTIN Take 1 capsule (100 mg total) by mouth 3 (three) times daily.   hydrochlorothiazide 12.5 MG tablet Commonly known as: HYDRODIURIL Take 1 tablet (12.5 mg total) by mouth daily.   Janumet 50-500 MG tablet Generic drug: sitaGLIPtin-metformin TAKE ONE TABLET BY MOUTH DAILY WITH BIGGEST MEAL OF THE DAY What changed:   how much to take  how to take this  when to take this  additional instructions   predniSONE 10 MG tablet Commonly known as: DELTASONE Take 40 mg daily for 1 day, 30 mg daily for 1 day, 20 mg daily for 1 days,10 mg daily for 1 day, then stop   STUDY - ARCADIA - APIXABAN 2.5MG OR PLACEBO (PI-SETHI) Take 5 mg by mouth 2 (two) times daily.   STUDY - AR[place]IA - APIXABAN 5MG OR PLACEBO (PI-SETHI) Take 5 mg by mouth 2 (two) times daily.   STUDY - [place]IA - ASPIRIN 81MG OR PLACEBO (PI-SETHI) Take 81 mg by mouth daily.   valsartan 320 MG  tablet Commonly known as: DIOVAN Take 1 tablet (  320 mg total) by mouth daily. To lower blood pressure       Follow-up Information    Fulp, Cammie, MD. Schedule an appointment as soon as possible for a visit in 1 week(s).   Specialty: Family Medicine Contact information: [address] [place] [address] [phone]              Allergies  Allergen Reactions  . Sulfa Antibiotics     Itching         Other Procedures/Studies: CT HEAD WO CONTRAST  Result Date: 07/24/2020 CLINICAL DATA:  Dizziness syncopal episodes EXAM: CT HEAD WITHOUT CONTRAST TECHNIQUE: Contiguous axial images were obtained from the base of the skull through the vertex without intravenous contrast. COMPARISON:  CT 05/15/2018 FINDINGS: Brain: Region of encephalomalacia involving the left occipital lobe compatible with expected evolution of a previously seen infarct. Stable remote left thalamic lacunar infarct. No evidence of new acute CT evident cortically based or vascular territory infarction, hemorrhage, hydrocephalus, extra-axial collection, visible mass lesion or mass effect. Symmetric prominence of the ventricles, cisterns and sulci compatible with parenchymal volume loss. Additional patchy and confluent areas of white matter hypoattenuation are most compatible with chronic microvascular angiopathy. Stable dural calcifications and tiny para falcine lipoma. Vascular: Atherosclerotic calcification of the carotid siphons. No hyperdense vessel. Skull: No calvarial fracture or suspicious osseous lesion. No scalp swelling or hematoma. Sinuses/Orbits: Paranasal sinuses and mastoid air cells are predominantly clear aside from some minimal mural thickening in the ethmoids. Orbital structures are unremarkable aside from prior lens extractions. Other: None. IMPRESSION: 1. No acute intracranial abnormality. 2. Encephalomalacia from remote infarct of the left occipital lobe. Additional remote left thalamic lacunar  infarct. 3. Stable parenchymal volume loss and chronic microvascular angiopathy. Electronically Signed   By: Lovena Le M.D.   On: 07/24/2020 22:52   DG CHEST PORT 1 VIEW  Result Date: 07/25/2020 CLINICAL DATA:  COVID-19 EXAM: PORTABLE CHEST 1 VIEW COMPARISON:  12/20/2009. FINDINGS: Mild hypoinflation. Hazy bibasilar opacities. No pneumothorax or pleural effusion. Cardiomediastinal silhouette within normal limits. Indwelling loop recorder. IMPRESSION: Hypoinflated lungs with bibasilar opacities, atelectasis versus infiltrate. Electronically Signed   By: Primitivo Gauze M.D.   On: 07/25/2020 08:20   CUP PACEART REMOTE DEVICE CHECK  Result Date: 07/25/2020 ILR summary report received. Battery status OK. Normal device function. No new symptom, tachy, brady, or pause episodes. No new AF episodes. Monthly summary reports and ROV/PRN    Rogers-DAY OF DISCHARGE:  Subjective:   Heather Rogers Rogers has no headache,no chest abdominal pain,no new weakness tingling or numbness, feels much better wants to go home Rogers.   Objective:   Blood pressure (!) 169/65, pulse 67, temperature 98.1 F (36.7 C), temperature source Oral, resp. rate 19, height _0  (1.575 m), weight 70.3 kg, SpO2 98 %.  Intake/Output Summary (Last 24 hours) at 07/27/2020 1027 Last data filed at 07/26/2020 1500 Gross per 24 hour  Intake 450 ml  Output --  Net 450 ml   Filed Weights   07/24/20 2214  Weight: 70.3 kg    Exam: Awake Alert, Oriented *3, No new F.N deficits, Normal affect [place].AT,PERRAL Supple Neck,No JVD, No cervical lymphadenopathy appriciated.  Symmetrical Chest wall movement, Good air movement bilaterally, CTAB RRR,No Gallops,Rubs or new Murmurs, No Parasternal Heave +ve B.Sounds, Abd Soft, Non tender, No organomegaly appriciated, No rebound -guarding or rigidity. No Cyanosis, Clubbing or edema, No new Rash or bruise   PERTINENT RADIOLOGIC STUDIES: CT HEAD WO CONTRAST  Result Date:  07/24/2020 CLINICAL DATA:  Dizziness syncopal episodes EXAM: CT HEAD WITHOUT CONTRAST TECHNIQUE: Contiguous axial images were obtained from the base of the skull through the vertex without intravenous contrast. COMPARISON:  CT 05/15/2018 FINDINGS: Brain: Region of encephalomalacia involving the left occipital lobe compatible with expected evolution of a previously seen infarct. Stable remote left thalamic lacunar infarct. No evidence of new acute CT evident cortically based or vascular territory infarction, hemorrhage, hydrocephalus, extra-axial collection, visible mass lesion or mass effect. Symmetric prominence of the ventricles, cisterns and sulci compatible with parenchymal volume loss. Additional patchy and confluent areas of white matter hypoattenuation are most compatible with chronic microvascular angiopathy. Stable dural calcifications and tiny para falcine lipoma. Vascular: Atherosclerotic calcification of the carotid siphons. No hyperdense vessel. Skull: No calvarial fracture or suspicious osseous lesion. No scalp swelling or hematoma. Sinuses/Orbits: Paranasal sinuses and mastoid air cells are predominantly clear aside from some minimal mural thickening in the ethmoids. Orbital structures are unremarkable aside from prior lens extractions. Other: None. IMPRESSION: 1. No acute intracranial abnormality. 2. Encephalomalacia from remote infarct of the left occipital lobe. Additional remote left thalamic lacunar infarct. 3. Stable parenchymal volume loss and chronic microvascular angiopathy. Electronically Signed   By: Lovena Le M.D.   On: 07/24/2020 22:52   DG CHEST PORT 1 VIEW  Result Date: 07/25/2020 CLINICAL DATA:  COVID-19 EXAM: PORTABLE CHEST 1 VIEW COMPARISON:  12/20/2009. FINDINGS: Mild hypoinflation. Hazy bibasilar opacities. No pneumothorax or pleural effusion. Cardiomediastinal silhouette within normal limits. Indwelling loop recorder. IMPRESSION: Hypoinflated lungs with bibasilar  opacities, atelectasis versus infiltrate. Electronically Signed   By: Primitivo Gauze M.D.   On: 07/25/2020 08:20   CUP PACEART REMOTE DEVICE CHECK  Result Date: 07/25/2020 ILR summary report received. Battery status OK. Normal device function. No new symptom, tachy, brady, or pause episodes. No new AF episodes. Monthly summary reports and ROV/PRN    PERTINENT LAB RESULTS: CBC: Recent Labs    07/26/20 0232 07/27/20 0151  WBC 3.4* 6.3  HGB 10.3* 10.3*  HCT 31.7* 31.3*  PLT 218 259   CMET CMP     Component Value Date/Time   NA 137 07/27/2020 0151   NA 141 04/11/2020 1215   K 4.4 07/27/2020 0151   CL 106 07/27/2020 0151   CO2 21 (L) 07/27/2020 0151   GLUCOSE 226 (H) 07/27/2020 0151   BUN 35 (H) 07/27/2020 0151   BUN 22 04/11/2020 1215   CREATININE 1.17 (H) 07/27/2020 0151   CALCIUM 8.8 (L) 07/27/2020 0151   PROT 7.1 07/27/2020 0151   PROT 7.9 04/11/2020 1215   ALBUMIN 3.1 (L) 07/27/2020 0151   ALBUMIN 4.5 04/11/2020 1215   AST 32 07/27/2020 0151   ALT 34 07/27/2020 0151   ALKPHOS 52 07/27/2020 0151   BILITOT 0.3 07/27/2020 0151   BILITOT 0.3 04/11/2020 1215   GFRNONAA 49 (L) 07/27/2020 0151   GFRAA 75 04/11/2020 1215    GFR Estimated Creatinine Clearance: 38.8 mL/min (A) (by C-G formula based on SCr of 1.17 mg/dL (H)). No results for input(s): LIPASE, AMYLASE in the last 72 hours. No results for input(s): CKTOTAL, CKMB, CKMBINDEX, TROPONINI in the last 72 hours. Invalid input(s): POCBNP Recent Labs    07/26/20 0232 07/27/20 0151  DDIMER 1.01* 0.63*   Recent Labs    07/25/20 0806  HGBA1C 6.9*   No results for input(s): CHOL, HDL, LDLCALC, TRIG, CHOLHDL, LDLDIRECT in the last 72 hours. No results for input(s): TSH, T4TOTAL, T3FREE, THYROIDAB in the last 72 hours.  Invalid input(s): FREET3 Recent Labs    07/26/20 0232 07/27/20 0151  FERRITIN 764* 799*   Coags: Recent Labs    07/24/20 2222  INR 1.6*   Microbiology: Recent Results (from the  past 240 hour(s))  Group A Strep by PCR     Status: None   Collection Time: 07/25/20  5:46 AM   Specimen: Nasopharyngeal Swab; Sterile Swab  Result Value Ref Range Status   Group A Strep by PCR NOT DETECTED NOT DETECTED Final    Comment: Performed at [facility], [address] [place], [place] [address]  Resp Panel by RT-PCR (Flu A&B, Covid) Nasopharyngeal Swab     Status: Abnormal   Collection Time: 07/25/20  5:46 AM   Specimen: Nasopharyngeal Swab; Nasopharyngeal(NP) swabs in vial transport medium  Result Value Ref Range Status   SARS Coronavirus 2 by RT PCR POSITIVE (A) NEGATIVE Final    Comment: RESULT CALLED TO, READ BACK BY AND VERIFIED WITH: K. MOON RN, AT 3267 07/25/20 BY D. VANHOOK (NOTE) SARS-CoV-2 target nucleic acids are DETECTED.  The SARS-CoV-2 RNA is generally detectable in upper respiratory specimens during the acute phase of infection. Positive results are indicative of the presence of the identified virus, but do not rule out bacterial infection or co-infection with other pathogens not detected by the test. Clinical correlation with patient history and other diagnostic information is necessary to determine patient infection status. The expected result is Negative.  Fact Sheet for Patients: EntrepreneurPulse.com.au  Fact Sheet for Healthcare Providers: IncredibleEmployment.be  This test is not yet approved or cleared by the [place] FDA and  has been authorized for detection and/or diagnosis of SARS-CoV-2 by FDA under an Emergency Use Authorization (EUA).  This EUA will remain in effect (meaning this te st can be used) for the duration of  the COVID-19 declaration under Section 564(b)(1) of the Act, 21 U.S.C. section 360bbb-3(b)(1), unless the authorization is terminated or revoked sooner.     Influenza A by PCR NEGATIVE NEGATIVE Final   Influenza B by PCR NEGATIVE NEGATIVE Final    Comment: (NOTE) The Xpert  Xpress SARS-CoV-2/FLU/RSV plus assay is intended as an aid in the diagnosis of influenza from Nasopharyngeal swab specimens and should not be used as a sole basis for treatment. Nasal washings and aspirates are unacceptable for Xpert Xpress SARS-CoV-2/FLU/RSV testing.  Fact Sheet for Patients: EntrepreneurPulse.com.au  Fact Sheet for Healthcare Providers: IncredibleEmployment.be  This test is not yet approved or cleared by the [place] FDA and has been authorized for detection and/or diagnosis of SARS-CoV-2 by FDA under an Emergency Use Authorization (EUA). This EUA will remain in effect (meaning this test can be used) for the duration of the COVID-19 declaration under Section 564(b)(1) of the Act, 21 U.S.C. section 360bbb-3(b)(1), unless the authorization is terminated or revoked.  Performed at [facility], [place] [address], [place], [place] [address]     FURTHER DISCHARGE INSTRUCTIONS:  Get Medicines reviewed and adjusted: Please take all your medications with you for your next visit with your Primary MD  Laboratory/radiological data: Please request your Primary MD to go over all hospital tests and procedure/radiological results at the follow up, please ask your Primary MD to get all Hospital records sent to his/her office.  In some cases, they will be blood work, cultures and biopsy results pending at the time of your discharge. Please request that your primary care M.D. goes through all the records of your hospital data and follows  up on these results.  Also Note the following: If you experience worsening of your admission symptoms, develop shortness of breath, life threatening emergency, suicidal or homicidal thoughts you must seek medical attention immediately by calling 911 or calling your MD immediately  if symptoms less severe.  You must read complete instructions/literature along with all the possible adverse reactions/side  effects for all the Medicines you take and that have been prescribed to you. Take any new Medicines after you have completely understood and accpet all the possible adverse reactions/side effects.   Do not drive when taking Pain medications or sleeping medications (Benzodaizepines)  Do not take more than prescribed Pain, Sleep and Anxiety Medications. It is not advisable to combine anxiety,sleep and pain medications without talking with your primary care practitioner  Special Instructions: If you have smoked or chewed Tobacco  in the last 2 yrs please stop smoking, stop any regular Alcohol  and or any Recreational drug use.  Wear Seat belts while driving.  Please note: You were cared for by a hospitalist during your hospital stay. Once you are discharged, your primary care physician will handle any further medical issues. Please note that NO REFILLS for any discharge medications will be authorized once you are discharged, as it is imperative that you return to your primary care physician (or establish a relationship with a primary care physician if you do not have one) for your post hospital discharge needs so that they can reassess your need for medications and monitor your lab values.  Total Time spent coordinating discharge including counseling, education and face to face time equals 35 minutes.  SignedOren Binet 07/27/2020 10:27 AM

## 2020-07-27 NOTE — Evaluation (Signed)
Occupational Therapy Evaluation Patient Details Name: Heather Rogers MRN: [id number] DOB: October 09, 1945 Today's Date: 07/27/2020    History of Present Illness Pt adm with syncope x 2 and found to have covid-19. 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 She reports her daughter lives near by and assists with meals and driving. At time of eval, pt is able to complete transfers and mobility at supervision level. She also demonstrates the ability to complete ADL at supervision level, but requires increased time and cues for energy conservation. Recommend pt follow up with HHOT for progression of ADLs in home environment. OT will sign off to defer to [facility] in anticipation of d/c this PM. Thank you for this consult.     Follow Up Recommendations  Home health OT;Supervision - Intermittent    Equipment Recommendations  None recommended by OT    Recommendations for Other Services       Precautions / Restrictions Precautions Precautions: Fall Restrictions Weight Bearing Restrictions: No      Mobility Bed Mobility Overal bed mobility: Modified Independent             General bed mobility comments: up in chair, returned to chair    Transfers Overall transfer level: Needs assistance Equipment used: None Transfers: Sit to/from Stand Sit to Stand: Supervision         General transfer comment: Performed x 2 (from bed and low toliet)    Balance Overall balance assessment: Needs assistance Sitting-balance support: No upper extremity supported;Feet supported Sitting balance-Leahy Scale: Normal     Standing balance support: During functional activity;No upper extremity supported;Bilateral upper extremity supported Standing balance-Leahy Scale: Fair Standing balance comment: RW or single UE support with  ambulation; static stand and ADLs without UE support                           ADL either performed or assessed with clinical judgement   ADL                                         General ADL Comments: Pt is overall at supervision level for ADLs. She is able to complete all necessary activities, but requires increased time and rest breaks. Reviewed safe energy conservation strategies as it applies to ADL routine. Educated pt on use of BSC for shower seat to maintain safety and energy     Vision   Additional Comments: R eye visual impairment from previous CVA     Perception     Praxis      Pertinent Vitals/Pain Pain Assessment: No/denies pain     Hand Dominance Right   Extremity/Trunk Assessment Upper Extremity Assessment Upper Extremity Assessment: Overall WFL for tasks assessed   Lower Extremity Assessment Lower Extremity Assessment: Defer to PT evaluation       Communication Communication Communication: No difficulties   Cognition Arousal/Alertness: Awake/alert Behavior During Therapy: WFL for tasks assessed/performed Overall Cognitive Status: Within Functional Limits for tasks assessed                                 General Comments: Per chart  residual short term memory loss due to CVA - however demonstrated good safety awareness (aware of visual field cut from CVA and compensates)and alert and oriented x 4 during therapy.   General Comments  O2 sats 99% on RA    Exercises     Shoulder Instructions      Home Living Family/patient expects to be discharged to:: Private residence Living Arrangements: Other relatives (niece with special needs) Available Help at Discharge: Family;Available PRN/intermittently (daughter checks in a lot) Type of Home: House Home Access: Ramped entrance     Home Layout: One level     Bathroom Shower/Tub: Chief Strategy Officer: Standard     Home Equipment: None           Prior Functioning/Environment Level of Independence: Independent        Comments: drives to church, daughter helps with meals        OT Problem List: Decreased strength;Decreased knowledge of use of DME or AE;Decreased activity tolerance;Impaired balance (sitting and/or standing)      OT Treatment/Interventions:      OT Goals(Current goals can be found in the care plan section) Acute Rehab OT Goals Patient Stated Goal: return home OT Goal Formulation: All assessment and education complete, DC therapy  OT Frequency:     Barriers to D/C:            Co-evaluation              AM-PAC OT "6 Clicks" Daily Activity     Outcome Measure Help from another person eating meals?: None Help from another person taking care of personal grooming?: None Help from another person toileting, which includes using toliet, bedpan, or urinal?: A Little Help from another person bathing (including washing, rinsing, drying)?: A Little Help from another person to put on and taking off regular upper body clothing?: None Help from another person to put on and taking off regular lower body clothing?: A Little 6 Click Score: 21   End of Session Equipment Utilized During Treatment: Gait belt;Rolling walker Nurse Communication: Mobility status  Activity Tolerance: Patient tolerated treatment well Patient left: in chair;with call bell/phone within reach  OT Visit Diagnosis: Unsteadiness on feet (R26.81);Other abnormalities of gait and mobility (R26.89);Muscle weakness (generalized) (M62.81)                Time: 0768-0881 OT Time Calculation (min): 19 min Charges:  OT General Charges $OT Visit: 1 Visit OT Evaluation $OT Eval Moderate Complexity: 1 Mod  Dalphine Handing, MSOT, OTR/L Acute Rehabilitation Services [facility] Office Number: [phone] Pager: [phone]  Dalphine Handing 07/27/2020, 1:47 PM

## 2020-07-27 NOTE — Progress Notes (Signed)
Patient scheduled for outpatient Remdesivir infusions at 12pm on Wednesday 12/30 and Thursday 12/31 at [facility]. Please inform the patient to park at [address] [place], [place], as staff will be escorting the patient through the east entrance of the hospital. Appointments take approximately 45 minutes.    There is a wave flag banner located near the entrance on [address]. Turn into this entrance and immediately turn left or right and park in 1 of the 10 designated Covid Infusion Parking spots. There is a phone number on the sign, please call and let the staff know what spot you are in and we will come out and get you. For questions call [phone].  Thanks.

## 2020-07-27 NOTE — TOC Transition Note (Signed)
Transition of Care [facility]) - CM/SW Discharge Note   Patient Details  Name: Heather Rogers MRN: [id number] Date of Birth: 1946/01/30  Transition of Care [facility]) CM/SW Contact:  Lawerance Sabal, RN Phone Number: 07/27/2020, 11:11 AM   Clinical Narrative:    Unable to reach patient. Daughter answered cell phone number listed. She is agreeable to [facility] services. Discussed providers, and referral made to [facility] who accepted.  No other CM needs identified.     Final next level of care: Home w Home Health Services Barriers to Discharge: No Barriers Identified   Patient Goals and CMS Choice Patient states their goals for this hospitalization and ongoing recovery are:: to go home CMS Medicare.gov Compare Post Acute Care list provided to:: Other (Comment Required) Choice offered to / list presented to : Adult Children  Discharge Placement                       Discharge Plan and Services                          HH Arranged: PT HH Agency: [facility] Date [facility] Agency Contacted: 07/27/20 Time HH Agency Contacted: 1110 Representative spoke with at [facility] Agency: Kandee Keen  Social Determinants of Health (SDOH) Interventions     Readmission Risk Interventions No flowsheet data found.

## 2020-07-27 NOTE — Progress Notes (Signed)
Discharge instructions, RX's and follow up appts explained and provided to patient and daughter via phone verbalized understanding. No c/o pain or shortness of breath at discharge.   Loretto Belinsky, Kae Heller, RN

## 2020-07-27 NOTE — Progress Notes (Signed)
Physical Therapy Treatment Patient Details Name: Heather Rogers MRN: [id number] DOB: August 23, 1945 Today's Date: 07/27/2020    History of Present Illness Pt adm with syncope x 2 and found to have covid-19. 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  She demonstrates gait with supervision with use of RW.  Encouraged use of RW initially at home.  Pt with intermittent support from daughter and necessary DME.  Her VSS on RA.   Pt is expected to d/c later today per notes, will continue POC while pt admitted.   Follow Up Recommendations  Home health PT;Supervision - Intermittent     Equipment Recommendations  None recommended by PT    Recommendations for Other Services       Precautions / Restrictions Precautions Precautions: Fall Restrictions Weight Bearing Restrictions: No    Mobility  Bed Mobility Overal bed mobility: Modified Independent                Transfers Overall transfer level: Needs assistance Equipment used: None Transfers: Sit to/from Stand Sit to Stand: Supervision         General transfer comment: Performed x 2 (from bed and low toliet)  Ambulation/Gait Ambulation/Gait assistance: Supervision Gait Distance (Feet): 250 Feet Assistive device: Rolling walker (2 wheeled) Gait Pattern/deviations: Step-through pattern Gait velocity: decr   General Gait Details: Pt does not typically use AD at home, so started without RW and pt reaching for furniture/wall/etc and unsteady requiring min A.  Gave pt walker and she demonstrated significant improvement and able to progress to supervision   Stairs             Wheelchair Mobility    Modified Rankin (Stroke Patients Only)       Balance Overall balance assessment: Needs assistance   Sitting balance-Leahy Scale: Normal     Standing balance support: During functional activity;No upper extremity supported;Bilateral upper extremity  supported Standing balance-Leahy Scale: Fair Standing balance comment: RW or single UE support with ambulation; static stand and ADLs without UE support                            Cognition Arousal/Alertness: Awake/alert Behavior During Therapy: WFL for tasks assessed/performed Overall Cognitive Status: Within Functional Limits for tasks assessed                                 General Comments: Per chart residual short term memory loss due to CVA - however demonstrated good safety awareness (aware of visual field cut from CVA and compensates)and alert and oriented x 4 during therapy.      Exercises      General Comments General comments (skin integrity, edema, etc.): O2 sats 99% on RA      Pertinent Vitals/Pain Pain Assessment: No/denies pain    Home Living Family/patient expects to be discharged to:: Private residence Living Arrangements: Other relatives (niece with special needs) Available Help at Discharge: Family;Available PRN/intermittently (daughter checks in a lot) Type of Home: House Home Access: Ramped entrance   Home Layout: One level Home Equipment: None      Prior Function Level of Independence: Independent      Comments: drives to church, daughter helps with meals   PT Goals (current goals can now be found in the care plan section) Acute Rehab PT  Goals Patient Stated Goal: return home PT Goal Formulation: With patient Time For Goal Achievement: 08/02/20 Potential to Achieve Goals: Good Progress towards PT goals: Progressing toward goals    Frequency    Min 3X/week      PT Plan Current plan remains appropriate    Co-evaluation              AM-PAC PT "6 Clicks" Mobility   Outcome Measure  Help needed turning from your back to your side while in a flat bed without using bedrails?: None Help needed moving from lying on your back to sitting on the side of a flat bed without using bedrails?: None Help needed moving  to and from a bed to a chair (including a wheelchair)?: A Little Help needed standing up from a chair using your arms (e.g., wheelchair or bedside chair)?: A Little Help needed to walk in hospital room?: A Little Help needed climbing 3-5 steps with a railing? : A Little 6 Click Score: 20    End of Session   Activity Tolerance: Patient tolerated treatment well Patient left: in chair;with call bell/phone within reach;with chair alarm set Nurse Communication: Mobility status PT Visit Diagnosis: Unsteadiness on feet (R26.81);Muscle weakness (generalized) (M62.81)     Time: 1050-1107 PT Time Calculation (min) (ACUTE ONLY): 17 min  Charges:  $Gait Training: 8-22 mins                     Anise Salvo, PT Acute Rehab Services Pager [phone] [facility] [phone]     Rayetta Humphrey 07/27/2020, 1:15 PM

## 2020-07-27 NOTE — Progress Notes (Signed)
Home meds returned from Pharmacy, patient left floor via wheelchair accompanied by staff.   Koralynn Greenspan, Kae Heller, RN

## 2020-07-27 NOTE — Plan of Care (Signed)

## 2020-07-28 ENCOUNTER — Ambulatory Visit (HOSPITAL_COMMUNITY)
Admit: 2020-07-28 | Discharge: 2020-07-28 | Disposition: A | Discharge status: Home / Self Care | Payer: Medicare HMO | Attending: Pulmonary Disease | Admitting: Pulmonary Disease

## 2020-07-28 MED ORDER — EPINEPHRINE 0.3 MG/0.3ML IJ SOAJ: 0.3000 mg | Freq: Once | INTRAMUSCULAR | Status: DC | PRN

## 2020-07-28 MED ORDER — METHYLPREDNISOLONE SODIUM SUCC 125 MG IJ SOLR: 125.0000 mg | Freq: Once | INTRAMUSCULAR | Status: DC | PRN

## 2020-07-28 MED ORDER — SODIUM CHLORIDE 0.9 % IV SOLN: INTRAVENOUS | Status: DC | PRN

## 2020-07-28 MED ORDER — ALBUTEROL SULFATE HFA 108 (90 BASE) MCG/ACT IN AERS: 2.0000 | INHALATION_SPRAY | Freq: Once | RESPIRATORY_TRACT | Status: DC | PRN

## 2020-07-28 MED ORDER — FAMOTIDINE IN NACL 20-0.9 MG/50ML-% IV SOLN: 20.0000 mg | Freq: Once | INTRAVENOUS | Status: DC | PRN

## 2020-07-28 MED ORDER — SODIUM CHLORIDE 0.9 % IV SOLN
100.0000 mg | Freq: Once | INTRAVENOUS | Status: AC
  Administered 2020-07-28: 13:00:00 100 mg via INTRAVENOUS

## 2020-07-28 MED ORDER — DIPHENHYDRAMINE HCL 50 MG/ML IJ SOLN: 50.0000 mg | Freq: Once | INTRAMUSCULAR | Status: DC | PRN

## 2020-07-28 NOTE — Progress Notes (Signed)
  Diagnosis: COVID-19  Physician: Dr. Delford Field  Procedure: Covid Infusion Clinic Med: remdesivir infusion - Provided patient with remdesivir fact sheet for patients, parents and caregivers prior to infusion.  Complications: No immediate complications noted.  Discharge: Discharged home   Edmon Crape 07/28/2020

## 2020-07-28 NOTE — Discharge Instructions (Signed)
If you have any questions or concerns after the infusion please call the Advanced Practice Provider on call at [phone]. This number is ONLY intended for your use regarding questions or concerns about the infusion post-treatment side-effects.  Please do not provide this number to others for use. For return to work notes please contact your primary care provider.   If someone you know is interested in receiving treatment please have them call the COVID hotline at [phone].    

## 2020-07-29 ENCOUNTER — Telehealth: Payer: Self-pay

## 2020-07-29 ENCOUNTER — Ambulatory Visit (HOSPITAL_COMMUNITY)
Admit: 2020-07-29 | Discharge: 2020-07-29 | Disposition: A | Discharge status: Home / Self Care | Payer: Medicare HMO | Source: Ambulatory Visit | Attending: Pulmonary Disease | Admitting: Pulmonary Disease

## 2020-07-29 DIAGNOSIS — U071 COVID-19: Secondary | ICD-10-CM | POA: Diagnosis not present

## 2020-07-29 DIAGNOSIS — J1282 Pneumonia due to coronavirus disease 2019: Secondary | ICD-10-CM | POA: Diagnosis not present

## 2020-07-29 MED ORDER — SODIUM CHLORIDE 0.9 % IV SOLN
100.0000 mg | Freq: Once | INTRAVENOUS | Status: AC
  Administered 2020-07-29: 100 mg via INTRAVENOUS
  Filled 2020-07-29: qty 20

## 2020-07-29 MED ORDER — DIPHENHYDRAMINE HCL 50 MG/ML IJ SOLN: 50.0000 mg | Freq: Once | INTRAMUSCULAR | Status: DC | PRN

## 2020-07-29 MED ORDER — EPINEPHRINE 0.3 MG/0.3ML IJ SOAJ: 0.3000 mg | Freq: Once | INTRAMUSCULAR | Status: DC | PRN

## 2020-07-29 MED ORDER — ALBUTEROL SULFATE HFA 108 (90 BASE) MCG/ACT IN AERS: 2.0000 | INHALATION_SPRAY | Freq: Once | RESPIRATORY_TRACT | Status: DC | PRN

## 2020-07-29 MED ORDER — SODIUM CHLORIDE 0.9 % IV SOLN: INTRAVENOUS | Status: DC | PRN

## 2020-07-29 MED ORDER — FAMOTIDINE IN NACL 20-0.9 MG/50ML-% IV SOLN: 20.0000 mg | Freq: Once | INTRAVENOUS | Status: DC | PRN

## 2020-07-29 MED ORDER — METHYLPREDNISOLONE SODIUM SUCC 125 MG IJ SOLR: 125.0000 mg | Freq: Once | INTRAMUSCULAR | Status: DC | PRN

## 2020-07-29 NOTE — Progress Notes (Signed)
  Diagnosis: COVID-19  Physician:Dr Delford Field   Procedure: Covid Infusion Clinic Med: remdesivir infusion - Provided patient with remdesivir fact sheet for patients, parents and caregivers prior to infusion.  Complications: No immediate complications noted.  Discharge: Discharged home   Heather Rogers 07/29/2020

## 2020-07-29 NOTE — Discharge Instructions (Signed)
10 Things You Can Do to Manage Your COVID-19 Symptoms at Home If you have possible or confirmed COVID-19: 1. Stay home from work and school. And stay away from other public places. If you must go out, avoid using any kind of public transportation, ridesharing, or taxis. 2. Monitor your symptoms carefully. If your symptoms get worse, call your healthcare provider immediately. 3. Get rest and stay hydrated. 4. If you have a medical appointment, call the healthcare provider ahead of time and tell them that you have or may have COVID-19. 5. For medical emergencies, call 911 and notify the dispatch personnel that you have or may have COVID-19. 6. Cover your cough and sneezes with a tissue or use the inside of your elbow. 7. Wash your hands often with soap and water for at least 20 seconds or clean your hands with an alcohol-based hand sanitizer that contains at least 60% alcohol. 8. As much as possible, stay in a specific room and away from other people in your home. Also, you should use a separate bathroom, if available. If you need to be around other people in or outside of the home, wear a mask. 9. Avoid sharing personal items with other people in your household, like dishes, towels, and bedding. 10. Clean all surfaces that are touched often, like counters, tabletops, and doorknobs. Use household cleaning sprays or wipes according to the label instructions. cdc.gov/coronavirus 01/28/2019 This information is not intended to replace advice given to you by your health care provider. Make sure you discuss any questions you have with your health care provider. Document Revised: 07/02/2019 Document Reviewed: 07/02/2019 Elsevier Patient Education  2020 Elsevier Inc.  
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
## 2020-08-05 NOTE — Progress Notes (Signed)
[facility] Summary Report / Loop Recorder 

## 2020-08-15 ENCOUNTER — Other Ambulatory Visit: Payer: Self-pay | Admitting: Family

## 2020-08-15 DIAGNOSIS — I10 Essential (primary) hypertension: Secondary | ICD-10-CM

## 2020-08-16 ENCOUNTER — Ambulatory Visit: Payer: Medicare HMO | Admitting: Family Medicine

## 2020-08-17 ENCOUNTER — Ambulatory Visit: Payer: No Typology Code available for payment source | Attending: Family Medicine | Admitting: Family Medicine

## 2020-08-17 ENCOUNTER — Encounter: Payer: Self-pay | Admitting: Family Medicine

## 2020-08-17 ENCOUNTER — Other Ambulatory Visit: Payer: Self-pay

## 2020-08-17 VITALS — BP 132/75 | HR 70 | Temp 98.4°F | Resp 16 | Wt 158.8 lb

## 2020-08-17 DIAGNOSIS — E1141 Type 2 diabetes mellitus with diabetic mononeuropathy: Secondary | ICD-10-CM

## 2020-08-17 DIAGNOSIS — Z8673 Personal history of transient ischemic attack (TIA), and cerebral infarction without residual deficits: Secondary | ICD-10-CM | POA: Diagnosis not present

## 2020-08-17 DIAGNOSIS — I10 Essential (primary) hypertension: Secondary | ICD-10-CM | POA: Diagnosis not present

## 2020-08-17 DIAGNOSIS — D649 Anemia, unspecified: Secondary | ICD-10-CM | POA: Diagnosis not present

## 2020-08-17 DIAGNOSIS — Z8616 Personal history of COVID-19: Secondary | ICD-10-CM

## 2020-08-17 LAB — GLUCOSE, POCT (MANUAL RESULT ENTRY): POC Glucose: 134 mg/dl — AB (ref 70–99)

## 2020-08-17 NOTE — Patient Instructions (Signed)
[employer] medicine (25th ed., pp. [phone]). [place], [place]: Elsevier.">  Anemia  Anemia is a condition in which there is not enough red blood cells or hemoglobin in the blood. Hemoglobin is a substance in red blood cells that carries oxygen. When you do not have enough red blood cells or hemoglobin (are anemic), your body cannot get enough oxygen and your organs may not work properly. As a result, you may feel very tired or have other problems. What are the causes? Common causes of anemia include:  Excessive bleeding. Anemia can be caused by excessive bleeding inside or outside the body, including bleeding from the intestines or from heavy menstrual periods in females.  Poor nutrition.  Long-lasting (chronic) kidney, thyroid, and liver disease.  Bone marrow disorders, spleen problems, and blood disorders.  Cancer and treatments for cancer.  HIV (human immunodeficiency virus) and AIDS (acquired immunodeficiency syndrome).  Infections, medicines, and autoimmune disorders that destroy red blood cells. What are the signs or symptoms? Symptoms of this condition include:  Minor weakness.  Dizziness.  Headache, or difficulties concentrating and sleeping.  Heartbeats that feel irregular or faster than normal (palpitations).  Shortness of breath, especially with exercise.  Pale skin, lips, and nails, or cold hands and feet.  Indigestion and nausea. Symptoms may occur suddenly or develop slowly. If your anemia is mild, you may not have symptoms. How is this diagnosed? This condition is diagnosed based on blood tests, your medical history, and a physical exam. In some cases, a test may be needed in which cells are removed from the soft tissue inside of a bone and looked at under a microscope (bone marrow biopsy). Your health care provider may also check your stool (feces) for blood and may do additional testing to look for the cause of your bleeding. Other tests may  include:  Imaging tests, such as a CT scan or MRI.  A procedure to see inside your esophagus and stomach (endoscopy).  A procedure to see inside your colon and rectum (colonoscopy). How is this treated? Treatment for this condition depends on the cause. If you continue to lose a lot of blood, you may need to be treated at a hospital. Treatment may include:  Taking supplements of iron, vitamin Q68, or folic acid.  Taking a hormone medicine (erythropoietin) that can help to stimulate red blood cell growth.  Having a blood transfusion. This may be needed if you lose a lot of blood.  Making changes to your diet.  Having surgery to remove your spleen. Follow these instructions at home:  Take over-the-counter and prescription medicines only as told by your health care provider.  Take supplements only as told by your health care provider.  Follow any diet instructions that you were given by your health care provider.  Keep all follow-up visits as told by your health care provider. This is important. Contact a health care provider if:  You develop new bleeding anywhere in the body. Get help right away if:  You are very weak.  You are short of breath.  You have pain in your abdomen or chest.  You are dizzy or feel faint.  You have trouble concentrating.  You have bloody stools, black stools, or tarry stools.  You vomit repeatedly or you vomit up blood. These symptoms may represent a serious problem that is an emergency. Do not wait to see if the symptoms will go away. Get medical help right away. Call your local emergency services (911 in the [place] Do not  drive yourself to the hospital. Summary  Anemia is a condition in which you do not have enough red blood cells or enough of a substance in your red blood cells that carries oxygen (hemoglobin).  Symptoms may occur suddenly or develop slowly.  If your anemia is mild, you may not have symptoms.  This condition is  diagnosed with blood tests, a medical history, and a physical exam. Other tests may be needed.  Treatment for this condition depends on the cause of the anemia. This information is not intended to replace advice given to you by your health care provider. Make sure you discuss any questions you have with your health care provider. Document Revised: 06/23/2019 Document Reviewed: 06/23/2019 Elsevier Patient Education  2021 Elsevier Inc.  

## 2020-08-17 NOTE — Progress Notes (Signed)
Subjective:  Patient ID: Heather Rogers, female    DOB: 1946-02-25  Age: 75 y.o. MRN: [id number]  CC: Diabetes and Hypertension   HPI Heather Rogers is a 75 year old female with a history of type 2 diabetes mellitus (A1c 6.9), hypertension, COVID-19 hospitalized at [facility] from 07/24/2020 through 07/27/2020 for syncope, acute kidney injury. She was treated with IV fluids, remdesivir and steroids; remdesivir infusion completed outpatient.  She was discharged with a hemoglobin of 10.3. CT head was negative for acute intracranial abnormality but revealed remote infarct in left occipital lobe, chronic microvascular angiopathy, stable parenchymal volume loss.  She is doing well today and denies presence of chest pain, upper respiratory symptoms, no dyspnea Fasting sugars have been 119- 143 she denies acute concerns today. She is accompanied by her daughter to today's visit.  Past Medical History:  Diagnosis Date  . Diabetes ([place])   . Hypertension   . Stroke (cerebrum) [facility])     Past Surgical History:  Procedure Laterality Date  . EYE SURGERY Left 04/06/2020   Implant placed  . EYE SURGERY Right 03/30/2020   Implant placed   . hysterectomy    . LOOP RECORDER INSERTION N/A 05/20/2018   Procedure: LOOP RECORDER INSERTION;  Surgeon: Thompson Grayer, MD;  Location: [facility];  Service: Cardiovascular;  Laterality: N/A;  . TEE WITHOUT CARDIOVERSION N/A 05/20/2018   Procedure: TRANSESOPHAGEAL ECHOCARDIOGRAM (TEE);  Surgeon: Sueanne Margarita, MD;  Location: [facility] ENDOSCOPY;  Service: Cardiovascular;  Laterality: N/A;    Family History  Problem Relation Age of Onset  . Hypertension Mother   . Stroke Mother   . Hypertension Father     Allergies  Allergen Reactions  . Sulfa Antibiotics     Itching     Outpatient Medications Prior to Visit  Medication Sig Dispense Refill  . Accu-Chek FastClix Lancets MISC Check blood sugar fasting and before meals and again  if pt feels bad (symptoms of hypo). 100 each 12  . allopurinol (ZYLOPRIM) 100 MG tablet Take 1 tablet (100 mg total) by mouth daily. 30 tablet 0  . amLODipine (NORVASC) 10 MG tablet TAKE ONE TABLET BY MOUTH DAILY TO LOWER BLOOD PRESSURE 90 tablet 0  . atorvastatin (LIPITOR) 80 MG tablet One pill by mouth in the evening to lower cholesterol 90 tablet 0  . benzonatate (TESSALON PERLES) 100 MG capsule Take 1 capsule (100 mg total) by mouth every 6 (six) hours as needed for cough. 30 capsule 0  . Blood Glucose Monitoring Suppl (ACCU-CHEK GUIDE) w/Device KIT 1 each by Does not apply route as directed. 1 kit 0  . carvedilol (COREG) 25 MG tablet TAKE ONE TABLET BY MOUTH TWICE A DAY WITH MEALS 180 tablet 1  . cholecalciferol (VITAMIN D3) 25 MCG (1000 UNIT) tablet Take 1,000 Units by mouth daily.    Marland Kitchen gabapentin (NEURONTIN) 100 MG capsule Take 1 capsule (100 mg total) by mouth 3 (three) times daily. 270 capsule 0  . glucose blood (ACCU-CHEK GUIDE) test strip CHECK BLOOD SUGAR FASTING AND BEFORE MEALS AND AGAIN IF PATIENT FEELS BAD (SYMPTOMS OF HYPO). 100 strip 11  . hydrochlorothiazide (HYDRODIURIL) 12.5 MG tablet TAKE ONE TABLET BY MOUTH DAILY 90 tablet 0  . sitaGLIPtin-metformin (JANUMET) 50-500 MG tablet TAKE ONE TABLET BY MOUTH DAILY WITH BIGGEST MEAL OF THE DAY (Patient taking differently: Take 1 tablet by mouth daily with supper.) 90 tablet 0  . STUDY - ARCADIA - apixaban 55m or placebo (PI - Sethi) Take  5 mg by mouth 2 (two) times daily.    . Study - [place] - aspirin 21m or Placebo Take 81 mg by mouth daily.    . valsartan (DIOVAN) 320 MG tablet Take 1 tablet (320 mg total) by mouth daily. To lower blood pressure 90 tablet 0  . predniSONE (DELTASONE) 10 MG tablet Take 40 mg daily for 1 day, 30 mg daily for 1 day, 20 mg daily for 1 days,10 mg daily for 1 day, then stop 10 tablet 0   No facility-administered medications prior to visit.     ROS Review of Systems  Constitutional: Negative for  activity change, appetite change and fatigue.  HENT: Negative for congestion, sinus pressure and sore throat.   Eyes: Negative for visual disturbance.  Respiratory: Negative for cough, chest tightness, shortness of breath and wheezing.   Cardiovascular: Negative for chest pain and palpitations.  Gastrointestinal: Negative for abdominal distention, abdominal pain and constipation.  Endocrine: Negative for polydipsia.  Genitourinary: Negative for dysuria and frequency.  Musculoskeletal: Negative for arthralgias and back pain.  Skin: Negative for rash.  Neurological: Negative for tremors, light-headedness and numbness.  Hematological: Does not bruise/bleed easily.  Psychiatric/Behavioral: Negative for agitation and behavioral problems.    Objective:  BP 132/75   Pulse 70   Temp 98.4 F (36.9 C)   Resp 16   Wt 158 lb 12.8 oz (72 kg)   SpO2 100%   BMI 29.04 kg/m   BP/Weight 08/17/2020 07/29/2020 117/91/5056 Systolic BP [id number] Diastolic BP 75 59 69  Wt. (Lbs) 158.8 - -  BMI 29.04 - -      Physical Exam Constitutional:      Appearance: She is well-developed.  Neck:     Vascular: No JVD.  Cardiovascular:     Rate and Rhythm: Normal rate.     Heart sounds: Normal heart sounds. No murmur heard.   Pulmonary:     Effort: Pulmonary effort is normal.     Breath sounds: Normal breath sounds. No wheezing or rales.  Chest:     Chest wall: No tenderness.  Abdominal:     General: Bowel sounds are normal. There is no distension.     Palpations: Abdomen is soft. There is no mass.     Tenderness: There is no abdominal tenderness.  Musculoskeletal:        General: Normal range of motion.     Right lower leg: No edema.     Left lower leg: No edema.  Neurological:     Mental Status: She is alert and oriented to person, place, and time.  Psychiatric:        Mood and Affect: Mood normal.     CMP Latest Ref Rng & Units 07/27/2020 07/26/2020 07/24/2020  Glucose 70 - 99 mg/dL  226(H) 181(H) 149(H)  BUN 8 - 23 mg/dL 35(H) 37(H) 39(H)  Creatinine 0.44 - 1.00 mg/dL 1.17(H) 1.16(H) 2.07(H)  Sodium 135 - 145 mmol/L 137 137 135  Potassium 3.5 - 5.1 mmol/L 4.4 4.0 3.8  Chloride 98 - 111 mmol/L 106 106 101  CO2 22 - 32 mmol/L 21(L) 21(L) 24  Calcium 8.9 - 10.3 mg/dL 8.8(L) 9.0 8.9  Total Protein 6.5 - 8.1 g/dL 7.1 7.1 8.0  Total Bilirubin 0.3 - 1.2 mg/dL 0.3 0.2(L) 0.6  Alkaline Phos 38 - 126 U/L 52 52 52  AST 15 - 41 U/L 32 32 21  ALT 0 - 44 U/L 34 24 16  Lipid Panel     Component Value Date/Time   CHOL 126 05/16/2018 0420   TRIG 175 (H) 05/16/2018 0420   HDL 27 (L) 05/16/2018 0420   CHOLHDL 4.7 05/16/2018 0420   VLDL 35 05/16/2018 0420   LDLCALC 64 05/16/2018 0420    CBC    Component Value Date/Time   WBC 6.3 07/27/2020 0151   RBC 4.47 07/27/2020 0151   HGB 10.3 (L) 07/27/2020 0151   HGB 11.2 04/11/2020 1215   HCT 31.3 (L) 07/27/2020 0151   HCT 35.5 04/11/2020 1215   PLT 259 07/27/2020 0151   PLT 288 05/27/2019 1208   MCV 70.0 (L) 07/27/2020 0151   MCV 73 (L) 04/11/2020 1215   MCH 23.0 (L) 07/27/2020 0151   MCHC 32.9 07/27/2020 0151   RDW 14.3 07/27/2020 0151   RDW 14.4 04/11/2020 1215   LYMPHSABS 0.7 07/27/2020 0151   LYMPHSABS 1.9 04/11/2020 1215   MONOABS 0.5 07/27/2020 0151   EOSABS 0.0 07/27/2020 0151   EOSABS 0.1 04/11/2020 1215   BASOSABS 0.0 07/27/2020 0151   BASOSABS 0.0 04/11/2020 1215    Lab Results  Component Value Date   HGBA1C 6.9 (H) 07/25/2020    Assessment & Plan:  1. Type 2 diabetes mellitus with diabetic mononeuropathy, without long-term current use of insulin (HCC) Controlled with A1c of 6.9 Continue current regimen - POCT glucose (manual entry) - CMP14+EGFR  2. Status post CVA Stable  3. Essential hypertension Controlled Counseled on blood pressure goal of less than 130/80, low-sodium, DASH diet, medication compliance, 150 minutes of moderate intensity exercise per week. Discussed medication compliance,  adverse effects.   4. Anemia, unspecified type - CBC with Differential/Platelet  5. History of COVID-19 Symptoms have resolved   No orders of the defined types were placed in this encounter.   Follow-up: Return in about 3 months (around 11/15/2020) for chronic disease management.       Charlott Rakes, MD, FAAFP. [facility] and [place], [place]   08/17/2020, 2:20 PM

## 2020-08-18 ENCOUNTER — Other Ambulatory Visit: Payer: Self-pay | Admitting: Family Medicine

## 2020-08-18 ENCOUNTER — Telehealth: Payer: Self-pay

## 2020-08-18 LAB — CBC WITH DIFFERENTIAL/PLATELET
Basophils Absolute: 0 10*3/uL (ref 0.0–0.2)
Basos: 0 %
EOS (ABSOLUTE): 0.1 10*3/uL (ref 0.0–0.4)
Eos: 2 %
Hematocrit: 31.2 % — ABNORMAL LOW (ref 34.0–46.6)
Hemoglobin: 10.1 g/dL — ABNORMAL LOW (ref 11.1–15.9)
Immature Grans (Abs): 0 10*3/uL (ref 0.0–0.1)
Immature Granulocytes: 0 %
Lymphocytes Absolute: 1.3 10*3/uL (ref 0.7–3.1)
Lymphs: 25 %
MCH: 22.9 pg — ABNORMAL LOW (ref 26.6–33.0)
MCHC: 32.4 g/dL (ref 31.5–35.7)
MCV: 71 fL — ABNORMAL LOW (ref 79–97)
Monocytes Absolute: 0.6 10*3/uL (ref 0.1–0.9)
Monocytes: 11 %
Neutrophils Absolute: 3.2 10*3/uL (ref 1.4–7.0)
Neutrophils: 62 %
Platelets: 238 10*3/uL (ref 150–450)
RBC: 4.42 x10E6/uL (ref 3.77–5.28)
RDW: 13.4 % (ref 11.7–15.4)
WBC: 5.2 10*3/uL (ref 3.4–10.8)

## 2020-08-18 LAB — CMP14+EGFR
ALT: 12 IU/L (ref 0–32)
AST: 13 IU/L (ref 0–40)
Albumin/Globulin Ratio: 1.2 (ref 1.2–2.2)
Albumin: 3.8 g/dL (ref 3.7–4.7)
Alkaline Phosphatase: 94 IU/L (ref 44–121)
BUN/Creatinine Ratio: 13 (ref 12–28)
BUN: 14 mg/dL (ref 8–27)
Bilirubin Total: <0.2 mg/dL (ref 0.0–1.2)
CO2: 22 mmol/L (ref 20–29)
Calcium: 9.4 mg/dL (ref 8.7–10.3)
Chloride: 105 mmol/L (ref 96–106)
Creatinine, Ser: 1.08 mg/dL — ABNORMAL HIGH (ref 0.57–1.00)
GFR calc Af Amer: 58 mL/min/{1.73_m2} — ABNORMAL LOW (ref >59)
GFR calc non Af Amer: 51 mL/min/{1.73_m2} — ABNORMAL LOW (ref >59)
Globulin, Total: 3.2 g/dL (ref 1.5–4.5)
Glucose: 131 mg/dL — ABNORMAL HIGH (ref 65–99)
Potassium: 4.4 mmol/L (ref 3.5–5.2)
Sodium: 142 mmol/L (ref 134–144)
Total Protein: 7 g/dL (ref 6.0–8.5)

## 2020-08-18 MED ORDER — FERROUS SULFATE 325 (65 FE) MG PO TBEC: 325.0000 mg | DELAYED_RELEASE_TABLET | Freq: Two times a day (BID) | ORAL | 1 refills | Status: DC

## 2020-08-18 NOTE — Telephone Encounter (Signed)
Pt has viewed results on mychart.

## 2020-08-18 NOTE — Telephone Encounter (Signed)
-----   Message from Charlott Rakes, MD sent at 08/18/2020  2:59 PM EST ----- Labs still reveal presence of anemia which might take some time to improve.  I have sent a prescription for iron tablets to the pharmacy

## 2020-08-26 LAB — CUP PACEART REMOTE DEVICE CHECK
Date Time Interrogation Session: 20220126234224
Implantable Pulse Generator Implant Date: 20191022

## 2020-08-29 ENCOUNTER — Ambulatory Visit (INDEPENDENT_AMBULATORY_CARE_PROVIDER_SITE_OTHER): Payer: Medicare HMO

## 2020-08-29 DIAGNOSIS — I6300 Cerebral infarction due to thrombosis of unspecified precerebral artery: Secondary | ICD-10-CM

## 2020-09-07 NOTE — Progress Notes (Signed)
[facility] Summary Report / Loop Recorder 

## 2020-10-03 ENCOUNTER — Ambulatory Visit (INDEPENDENT_AMBULATORY_CARE_PROVIDER_SITE_OTHER): Payer: Medicare HMO

## 2020-10-03 DIAGNOSIS — I6300 Cerebral infarction due to thrombosis of unspecified precerebral artery: Secondary | ICD-10-CM

## 2020-10-05 LAB — CUP PACEART REMOTE DEVICE CHECK
Date Time Interrogation Session: 20220228235327
Implantable Pulse Generator Implant Date: 20191022

## 2020-10-12 NOTE — Progress Notes (Signed)
[facility] Summary Report / Loop Recorder 

## 2020-10-18 ENCOUNTER — Other Ambulatory Visit: Payer: Self-pay | Admitting: Family

## 2020-10-18 DIAGNOSIS — Z8673 Personal history of transient ischemic attack (TIA), and cerebral infarction without residual deficits: Secondary | ICD-10-CM

## 2020-10-18 DIAGNOSIS — I10 Essential (primary) hypertension: Secondary | ICD-10-CM

## 2020-10-18 DIAGNOSIS — I672 Cerebral atherosclerosis: Secondary | ICD-10-CM

## 2020-10-24 ENCOUNTER — Other Ambulatory Visit: Payer: Self-pay | Admitting: Family

## 2020-10-24 DIAGNOSIS — I10 Essential (primary) hypertension: Secondary | ICD-10-CM

## 2020-10-24 DIAGNOSIS — E1141 Type 2 diabetes mellitus with diabetic mononeuropathy: Secondary | ICD-10-CM

## 2020-10-24 MED ORDER — HYDROCHLOROTHIAZIDE 12.5 MG PO TABS: 12.5000 mg | ORAL_TABLET | Freq: Every day | ORAL | 0 refills | Status: DC

## 2020-10-24 MED ORDER — JANUMET 50-500 MG PO TABS: ORAL_TABLET | ORAL | 0 refills | Status: DC

## 2020-10-24 NOTE — Telephone Encounter (Signed)
Refill was placed today by RN Rosana Hoes

## 2020-10-24 NOTE — Telephone Encounter (Signed)
Please advise. Patients most recent creatinine was elevated in jan.

## 2020-10-24 NOTE — Telephone Encounter (Signed)
Pt's daughter Allayne Stack called to report that the patient has one Janumet pill left and needs this as soon as possible  [facility] [address], [place] - [address]  [place] [address]  Phone: [phone] Fax: [phone]

## 2020-11-05 LAB — CUP PACEART REMOTE DEVICE CHECK
Date Time Interrogation Session: 20220403013051
Implantable Pulse Generator Implant Date: 20191022

## 2020-11-07 ENCOUNTER — Ambulatory Visit (INDEPENDENT_AMBULATORY_CARE_PROVIDER_SITE_OTHER): Payer: Medicare HMO

## 2020-11-07 DIAGNOSIS — I6300 Cerebral infarction due to thrombosis of unspecified precerebral artery: Secondary | ICD-10-CM | POA: Diagnosis not present

## 2020-11-13 ENCOUNTER — Other Ambulatory Visit: Payer: Self-pay | Admitting: Family

## 2020-11-13 ENCOUNTER — Other Ambulatory Visit: Payer: Self-pay | Admitting: Family Medicine

## 2020-11-13 DIAGNOSIS — I10 Essential (primary) hypertension: Secondary | ICD-10-CM

## 2020-11-13 MED ORDER — HYDROCHLOROTHIAZIDE 12.5 MG PO TABS: 12.5000 mg | ORAL_TABLET | Freq: Every day | ORAL | 0 refills | Status: DC

## 2020-11-13 MED ORDER — VALSARTAN 320 MG PO TABS: 320.0000 mg | ORAL_TABLET | Freq: Every day | ORAL | 0 refills | Status: DC

## 2020-11-14 ENCOUNTER — Other Ambulatory Visit: Payer: Self-pay | Admitting: Family

## 2020-11-14 DIAGNOSIS — Z8673 Personal history of transient ischemic attack (TIA), and cerebral infarction without residual deficits: Secondary | ICD-10-CM

## 2020-11-14 DIAGNOSIS — I672 Cerebral atherosclerosis: Secondary | ICD-10-CM

## 2020-11-14 MED ORDER — AMLODIPINE BESYLATE 10 MG PO TABS: 10.0000 mg | ORAL_TABLET | Freq: Every day | ORAL | 0 refills | Status: DC

## 2020-11-17 ENCOUNTER — Other Ambulatory Visit: Payer: Self-pay

## 2020-11-17 ENCOUNTER — Encounter: Payer: Self-pay | Admitting: Family Medicine

## 2020-11-17 ENCOUNTER — Ambulatory Visit: Payer: Medicare HMO | Attending: Family Medicine | Admitting: Family Medicine

## 2020-11-17 VITALS — BP 130/79 | HR 65 | Resp 18 | Ht 63.0 in | Wt 157.6 lb

## 2020-11-17 DIAGNOSIS — Z8673 Personal history of transient ischemic attack (TIA), and cerebral infarction without residual deficits: Secondary | ICD-10-CM | POA: Diagnosis not present

## 2020-11-17 DIAGNOSIS — I152 Hypertension secondary to endocrine disorders: Secondary | ICD-10-CM | POA: Diagnosis not present

## 2020-11-17 DIAGNOSIS — E1159 Type 2 diabetes mellitus with other circulatory complications: Secondary | ICD-10-CM

## 2020-11-17 DIAGNOSIS — I672 Cerebral atherosclerosis: Secondary | ICD-10-CM

## 2020-11-17 DIAGNOSIS — E1141 Type 2 diabetes mellitus with diabetic mononeuropathy: Secondary | ICD-10-CM

## 2020-11-17 LAB — POCT GLYCOSYLATED HEMOGLOBIN (HGB A1C): HbA1c, POC (controlled diabetic range): 6.7 % (ref 0.0–7.0)

## 2020-11-17 MED ORDER — METFORMIN HCL 500 MG PO TABS: 500.0000 mg | ORAL_TABLET | Freq: Two times a day (BID) | ORAL | 1 refills | Status: DC

## 2020-11-17 MED ORDER — GABAPENTIN 100 MG PO CAPS
100.0000 mg | ORAL_CAPSULE | Freq: Three times a day (TID) | ORAL | 0 refills | Status: DC
Start: 2020-11-17 — End: 2021-05-09

## 2020-11-17 MED ORDER — ALLOPURINOL 100 MG PO TABS: 100.0000 mg | ORAL_TABLET | Freq: Every day | ORAL | 1 refills | Status: DC

## 2020-11-17 MED ORDER — CARVEDILOL 25 MG PO TABS: 25.0000 mg | ORAL_TABLET | Freq: Two times a day (BID) | ORAL | 1 refills | Status: DC

## 2020-11-17 MED ORDER — ATORVASTATIN CALCIUM 80 MG PO TABS: ORAL_TABLET | ORAL | 1 refills | Status: DC

## 2020-11-17 MED ORDER — VALSARTAN-HYDROCHLOROTHIAZIDE 320-12.5 MG PO TABS: 1.0000 | ORAL_TABLET | Freq: Every day | ORAL | 1 refills | Status: DC

## 2020-11-17 MED ORDER — AMLODIPINE BESYLATE 10 MG PO TABS: 10.0000 mg | ORAL_TABLET | Freq: Every day | ORAL | 1 refills | Status: DC

## 2020-11-17 NOTE — Progress Notes (Signed)
Subjective:  Patient ID: Heather Rogers, female    DOB: 1946-02-23  Age: 75 y.o. MRN: [id number]  CC: 3 Month follow up    HPI Heather Rogers  is a 75 year old female with a history of type 2 diabetes mellitus (A1c 6.7), hypertension, gout, COVID-19 in 06/2020 here for follow-up visit accompanied by her daughter. She has been compliant with her antihypertensive and denies presence of gout flares. Doing well on her Janumet for metformin but daughter complains that a 90-day supply of Janumet costs $145 and she was only able to obtain a 30-day supply as the pharmacy had run out. Her blood sugar log has been reviewed which indicates fasting sugars ranging from the 90s to the mid 110s and she denies episodes of hypoglycemia.  Systolic blood pressures have also ranged from 110-138. She has no chest pain or dyspnea and has no additional concerns today.  Past Medical History:  Diagnosis Date  . Diabetes ([place])   . Hypertension   . Stroke (cerebrum) [facility])     Past Surgical History:  Procedure Laterality Date  . EYE SURGERY Left 04/06/2020   Implant placed  . EYE SURGERY Right 03/30/2020   Implant placed   . hysterectomy    . LOOP RECORDER INSERTION N/A 05/20/2018   Procedure: LOOP RECORDER INSERTION;  Surgeon: Thompson Grayer, MD;  Location: [facility];  Service: Cardiovascular;  Laterality: N/A;  . TEE WITHOUT CARDIOVERSION N/A 05/20/2018   Procedure: TRANSESOPHAGEAL ECHOCARDIOGRAM (TEE);  Surgeon: Sueanne Margarita, MD;  Location: [facility] ENDOSCOPY;  Service: Cardiovascular;  Laterality: N/A;    Family History  Problem Relation Age of Onset  . Hypertension Mother   . Stroke Mother   . Hypertension Father     Allergies  Allergen Reactions  . Sulfa Antibiotics     Itching     Outpatient Medications Prior to Visit  Medication Sig Dispense Refill  . Accu-Chek FastClix Lancets MISC Check blood sugar fasting and before meals and again if pt feels bad (symptoms of hypo).  100 each 12  . Blood Glucose Monitoring Suppl (ACCU-CHEK GUIDE) w/Device KIT 1 each by Does not apply route as directed. 1 kit 0  . cholecalciferol (VITAMIN D3) 25 MCG (1000 UNIT) tablet Take 1,000 Units by mouth daily.    . ferrous sulfate 325 (65 FE) MG EC tablet Take 1 tablet (325 mg total) by mouth in the morning and at bedtime. 60 tablet 1  . glucose blood (ACCU-CHEK GUIDE) test strip CHECK BLOOD SUGAR FASTING AND BEFORE MEALS AND AGAIN IF PATIENT FEELS BAD (SYMPTOMS OF HYPO). 100 strip 11  . STUDY - ARCAD[place] - apixaban 97m or placebo (PI - Sethi) Take 5 mg by mouth 2 (two) times daily.    . Study - [place] - aspirin 853mor Placebo Take 81 mg by mouth daily.    . Marland Kitchenllopurinol (ZYLOPRIM) 100 MG tablet Take 1 tablet (100 mg total) by mouth daily. 30 tablet 0  . amLODipine (NORVASC) 10 MG tablet Take 1 tablet (10 mg total) by mouth daily. 90 tablet 0  . atorvastatin (LIPITOR) 80 MG tablet TAKE ONE TABLET BY MOUTH EVERY EVENING TO LOWER CHOLESTEROL 30 tablet 0  . carvedilol (COREG) 25 MG tablet TAKE ONE TABLET BY MOUTH TWICE A DAY WITH MEALS 180 tablet 1  . hydrochlorothiazide (HYDRODIURIL) 12.5 MG tablet Take 1 tablet (12.5 mg total) by mouth daily. 90 tablet 0  . sitaGLIPtin-metformin (JANUMET) 50-500 MG tablet TAKE ONE TABLET BY MOUTH DAILY  WITH BIGGEST MEAL OF THE DAY 90 tablet 0  . valsartan (DIOVAN) 320 MG tablet Take 1 tablet (320 mg total) by mouth daily. To lower blood pressure 90 tablet 0  . benzonatate (TESSALON PERLES) 100 MG capsule Take 1 capsule (100 mg total) by mouth every 6 (six) hours as needed for cough. (Patient not taking: Reported on 11/17/2020) 30 capsule 0  . gabapentin (NEURONTIN) 100 MG capsule Take 1 capsule (100 mg total) by mouth 3 (three) times daily. 270 capsule 0   No facility-administered medications prior to visit.     ROS Review of Systems  Constitutional: Negative for activity change, appetite change and fatigue.  HENT: Negative for congestion, sinus  pressure and sore throat.   Eyes: Negative for visual disturbance.  Respiratory: Negative for cough, chest tightness, shortness of breath and wheezing.   Cardiovascular: Negative for chest pain and palpitations.  Gastrointestinal: Negative for abdominal distention, abdominal pain and constipation.  Endocrine: Negative for polydipsia.  Genitourinary: Negative for dysuria and frequency.  Musculoskeletal: Negative for arthralgias and back pain.  Skin: Negative for rash.  Neurological: Negative for tremors, light-headedness and numbness.  Hematological: Does not bruise/bleed easily.  Psychiatric/Behavioral: Negative for agitation and behavioral problems.    Objective:  BP 130/79   Pulse 65   Resp 18   Ht 5' 3"  (1.6 m)   Wt 157 lb 9.6 oz (71.5 kg)   SpO2 99%   BMI 27.92 kg/m   BP/Weight 11/17/2020 08/17/2020 40/04/2724  Systolic BP 366 440 347  Diastolic BP 79 75 59  Wt. (Lbs) 157.6 158.8 -  BMI 27.92 29.04 -      Physical Exam Constitutional:      Appearance: She is well-developed.  Neck:     Vascular: No JVD.  Cardiovascular:     Rate and Rhythm: Normal rate.     Heart sounds: Normal heart sounds. No murmur heard.   Pulmonary:     Effort: Pulmonary effort is normal.     Breath sounds: Normal breath sounds. No wheezing or rales.  Chest:     Chest wall: No tenderness.  Abdominal:     General: Bowel sounds are normal. There is no distension.     Palpations: Abdomen is soft. There is no mass.     Tenderness: There is no abdominal tenderness.  Musculoskeletal:        General: Normal range of motion.     Right lower leg: No edema.     Left lower leg: No edema.  Neurological:     Mental Status: She is alert and oriented to person, place, and time.  Psychiatric:        Mood and Affect: Mood normal.     CMP Latest Ref Rng & Units 08/17/2020 07/27/2020 07/26/2020  Glucose 65 - 99 mg/dL 131(H) 226(H) 181(H)  BUN 8 - 27 mg/dL 14 35(H) 37(H)  Creatinine 0.57 - 1.00  mg/dL 1.08(H) 1.17(H) 1.16(H)  Sodium 134 - 144 mmol/L 142 137 137  Potassium 3.5 - 5.2 mmol/L 4.4 4.4 4.0  Chloride 96 - 106 mmol/L 105 106 106  CO2 20 - 29 mmol/L 22 21(L) 21(L)  Calcium 8.7 - 10.3 mg/dL 9.4 8.8(L) 9.0  Total Protein 6.0 - 8.5 g/dL 7.0 7.1 7.1  Total Bilirubin 0.0 - 1.2 mg/dL <0.2 0.3 0.2(L)  Alkaline Phos 44 - 121 IU/L 94 52 52  AST 0 - 40 IU/L 13 32 32  ALT 0 - 32 IU/L 12 34 24  Lipid Panel     Component Value Date/Time   CHOL 126 05/16/2018 0420   TRIG 175 (H) 05/16/2018 0420   HDL 27 (L) 05/16/2018 0420   CHOLHDL 4.7 05/16/2018 0420   VLDL 35 05/16/2018 0420   LDLCALC 64 05/16/2018 0420    CBC    Component Value Date/Time   WBC 5.2 08/17/2020 0948   WBC 6.3 07/27/2020 0151   RBC 4.42 08/17/2020 0948   RBC 4.47 07/27/2020 0151   HGB 10.1 (L) 08/17/2020 0948   HCT 31.2 (L) 08/17/2020 0948   PLT 238 08/17/2020 0948   MCV 71 (L) 08/17/2020 0948   MCH 22.9 (L) 08/17/2020 0948   MCH 23.0 (L) 07/27/2020 0151   MCHC 32.4 08/17/2020 0948   MCHC 32.9 07/27/2020 0151   RDW 13.4 08/17/2020 0948   LYMPHSABS 1.3 08/17/2020 0948   MONOABS 0.5 07/27/2020 0151   EOSABS 0.1 08/17/2020 0948   BASOSABS 0.0 08/17/2020 0948    Lab Results  Component Value Date   HGBA1C 6.7 11/17/2020    Assessment & Plan:  1. Type 2 diabetes mellitus with diabetic mononeuropathy, without long-term current use of insulin (HCC) Controlled with A1c of 6.7; goal is less than 7.5 Due to cost of Janumet I have switched her to metformin Counseled on Diabetic diet, my plate method, 643 minutes of moderate intensity exercise/week Blood sugar logs with fasting goals of 80-120 mg/dl, random of less than 180 and in the event of sugars less than 60 mg/dl or greater than 400 mg/dl encouraged to notify the clinic. Advised on the need for annual eye exams, annual foot exams, Pneumonia vaccine. - POCT glycosylated hemoglobin (Hb A1C) - metFORMIN (GLUCOPHAGE) 500 MG tablet; Take 1 tablet  (500 mg total) by mouth 2 (two) times daily with a meal.  Dispense: 180 tablet; Refill: 1 - gabapentin (NEURONTIN) 100 MG capsule; Take 1 capsule (100 mg total) by mouth 3 (three) times daily.  Dispense: 270 capsule; Refill: 0  2. Hypertension associated with diabetes ([place]) Controlled Counseled on blood pressure goal of less than 130/80, low-sodium, DASH diet, medication compliance, 150 minutes of moderate intensity exercise per week. Discussed medication compliance, adverse effects. - valsartan-hydrochlorothiazide (DIOVAN-HCT) 320-12.5 MG tablet; Take 1 tablet by mouth daily.  Dispense: 90 tablet; Refill: 1 - amLODipine (NORVASC) 10 MG tablet; Take 1 tablet (10 mg total) by mouth daily.  Dispense: 90 tablet; Refill: 1 - carvedilol (COREG) 25 MG tablet; Take 1 tablet (25 mg total) by mouth 2 (two) times daily with a meal.  Dispense: 180 tablet; Refill: 1  3. Status post CVA Stable Risk factor modification - atorvastatin (LIPITOR) 80 MG tablet; TAKE ONE TABLET BY MOUTH EVERY EVENING TO LOWER CHOLESTEROL  Dispense: 90 tablet; Refill: 1  4. Atherosclerotic cerebrovascular disease Low-cholesterol diet - atorvastatin (LIPITOR) 80 MG tablet; TAKE ONE TABLET BY MOUTH EVERY EVENING TO LOWER CHOLESTEROL  Dispense: 90 tablet; Refill: 1   Meds ordered this encounter  Medications  . valsartan-hydrochlorothiazide (DIOVAN-HCT) 320-12.5 MG tablet    Sig: Take 1 tablet by mouth daily.    Dispense:  90 tablet    Refill:  1    Discontinue Diovan, discontinue HCTZ  . metFORMIN (GLUCOPHAGE) 500 MG tablet    Sig: Take 1 tablet (500 mg total) by mouth 2 (two) times daily with a meal.    Dispense:  180 tablet    Refill:  1    Discontinue Janumet  . allopurinol (ZYLOPRIM) 100 MG tablet    Sig:  Take 1 tablet (100 mg total) by mouth daily.    Dispense:  90 tablet    Refill:  1  . amLODipine (NORVASC) 10 MG tablet    Sig: Take 1 tablet (10 mg total) by mouth daily.    Dispense:  90 tablet    Refill:  1   . atorvastatin (LIPITOR) 80 MG tablet    Sig: TAKE ONE TABLET BY MOUTH EVERY EVENING TO LOWER CHOLESTEROL    Dispense:  90 tablet    Refill:  1  . carvedilol (COREG) 25 MG tablet    Sig: Take 1 tablet (25 mg total) by mouth 2 (two) times daily with a meal.    Dispense:  180 tablet    Refill:  1  . gabapentin (NEURONTIN) 100 MG capsule    Sig: Take 1 capsule (100 mg total) by mouth 3 (three) times daily.    Dispense:  270 capsule    Refill:  0    Follow-up: Return in about 3 months (around 02/16/2021) for Chronic disease management.       Heather Rakes, MD, FAAFP. [facility] and [place], [place]   11/18/2020, 11:06 AM

## 2020-11-17 NOTE — Patient Instructions (Signed)
Diabetes Mellitus and Exercise Exercising regularly is important for overall health, especially for people who have diabetes mellitus. Exercising is not only about losing weight. It has many other health benefits, such as increasing muscle strength and bone density and reducing body fat and stress. This leads to improved fitness, flexibility, and endurance, all of which result in better overall health. What are the benefits of exercise if I have diabetes? Exercise has many benefits for people with diabetes. They include:  Helping to lower and control blood sugar (glucose).  Helping the body to respond better to the hormone insulin by improving insulin sensitivity.  Reducing how much insulin the body needs.  Lowering the risk for heart disease by: ? Lowering "bad" cholesterol and triglyceride levels. ? Increasing "good" cholesterol levels. ? Lowering blood pressure. ? Lowering blood glucose levels. What is my activity plan? Your health care provider or certified diabetes educator can help you make a plan for the type and frequency of exercise that works for you. This is called your activity plan. Be sure to:  Get at least 150 minutes of medium-intensity or high-intensity exercise each week. Exercises may include brisk walking, biking, or water aerobics.  Do stretching and strengthening exercises, such as yoga or weight lifting, at least 2 times a week.  Spread out your activity over at least 3 days of the week.  Get some form of physical activity each day. ? Do not go more than 2 days in a row without some kind of physical activity. ? Avoid being inactive for more than 90 minutes at a time. Take frequent breaks to walk or stretch.  Choose exercises or activities that you enjoy. Set realistic goals.  Start slowly and gradually increase your exercise intensity over time.   How do I manage my diabetes during exercise? Monitor your blood glucose  Check your blood glucose before and  after exercising. If your blood glucose is: ? 240 mg/dL (13.3 mmol/L) or higher before you exercise, check your urine for ketones. These are chemicals created by the liver. If you have ketones in your urine, do not exercise until your blood glucose returns to normal. ? 100 mg/dL (5.6 mmol/L) or lower, eat a snack containing 15-20 grams of carbohydrate. Check your blood glucose 15 minutes after the snack to make sure that your glucose level is above 100 mg/dL (5.6 mmol/L) before you start your exercise.  Know the symptoms of low blood glucose (hypoglycemia) and how to treat it. Your risk for hypoglycemia increases during and after exercise. Follow these tips and your health care provider's instructions  Keep a carbohydrate snack that is fast-acting for use before, during, and after exercise to help prevent or treat hypoglycemia.  Avoid injecting insulin into areas of the body that are going to be exercised. For example, avoid injecting insulin into: ? Your arms, when you are about to play tennis. ? Your legs, when you are about to go jogging.  Keep records of your exercise habits. Doing this can help you and your health care provider adjust your diabetes management plan as needed. Write down: ? Food that you eat before and after you exercise. ? Blood glucose levels before and after you exercise. ? The type and amount of exercise you have done.  Work with your health care provider when you start a new exercise or activity. He or she may need to: ? Make sure that the activity is safe for you. ? Adjust your insulin, other medicines, and food that   you eat.  Drink plenty of water while you exercise. This prevents loss of water (dehydration) and problems caused by a lot of heat in the body (heat stroke).   Where to find more information  American Diabetes Association: www.diabetes.org Summary  Exercising regularly is important for overall health, especially for people who have diabetes  mellitus.  Exercising has many health benefits. It increases muscle strength and bone density and reduces body fat and stress. It also lowers and controls blood glucose.  Your health care provider or certified diabetes educator can help you make an activity plan for the type and frequency of exercise that works for you.  Work with your health care provider to make sure any new activity is safe for you. Also work with your health care provider to adjust your insulin, other medicines, and the food you eat. This information is not intended to replace advice given to you by your health care provider. Make sure you discuss any questions you have with your health care provider. Document Revised: 04/13/2019 Document Reviewed: 04/13/2019 Elsevier Patient Education  2021 Elsevier Inc.  

## 2020-11-22 NOTE — Progress Notes (Signed)
[facility] Summary Report / Loop Recorder 

## 2020-12-12 ENCOUNTER — Ambulatory Visit (INDEPENDENT_AMBULATORY_CARE_PROVIDER_SITE_OTHER): Payer: Medicare HMO

## 2020-12-12 DIAGNOSIS — I6300 Cerebral infarction due to thrombosis of unspecified precerebral artery: Secondary | ICD-10-CM | POA: Diagnosis not present

## 2020-12-14 LAB — CUP PACEART REMOTE DEVICE CHECK
Date Time Interrogation Session: 20220514231159
Implantable Pulse Generator Implant Date: 20191022

## 2021-01-04 NOTE — Progress Notes (Signed)
[facility] Summary Report / Loop Recorder 

## 2021-01-16 ENCOUNTER — Ambulatory Visit (INDEPENDENT_AMBULATORY_CARE_PROVIDER_SITE_OTHER): Payer: Medicare HMO

## 2021-01-16 DIAGNOSIS — I639 Cerebral infarction, unspecified: Secondary | ICD-10-CM | POA: Diagnosis not present

## 2021-01-16 LAB — CUP PACEART REMOTE DEVICE CHECK
Date Time Interrogation Session: 20220616232147
Implantable Pulse Generator Implant Date: 20191022

## 2021-02-06 NOTE — Progress Notes (Signed)
[facility] Summary Report / Loop Recorder 

## 2021-02-14 ENCOUNTER — Encounter: Payer: Self-pay | Admitting: Family Medicine

## 2021-02-14 ENCOUNTER — Telehealth: Payer: Self-pay | Admitting: Family Medicine

## 2021-02-14 ENCOUNTER — Ambulatory Visit: Payer: Medicare HMO | Attending: Family Medicine | Admitting: Family Medicine

## 2021-02-14 ENCOUNTER — Other Ambulatory Visit: Payer: Self-pay

## 2021-02-14 DIAGNOSIS — I152 Hypertension secondary to endocrine disorders: Secondary | ICD-10-CM

## 2021-02-14 DIAGNOSIS — E1159 Type 2 diabetes mellitus with other circulatory complications: Secondary | ICD-10-CM

## 2021-02-14 DIAGNOSIS — M1A00X0 Idiopathic chronic gout, unspecified site, without tophus (tophi): Secondary | ICD-10-CM

## 2021-02-14 DIAGNOSIS — E1141 Type 2 diabetes mellitus with diabetic mononeuropathy: Secondary | ICD-10-CM

## 2021-02-14 DIAGNOSIS — M109 Gout, unspecified: Secondary | ICD-10-CM | POA: Insufficient documentation

## 2021-02-14 NOTE — Telephone Encounter (Signed)
Called pt to schedule: 74-month follow-up for diabetes management for October With Dr. Margarita Rana. No answer and cannot LVM.

## 2021-02-14 NOTE — Progress Notes (Signed)
Virtual Visit via Telephone Note  I connected with [name], on 02/14/2021 at 11:22 AM by telephone due to the COVID-19 pandemic and verified that I am speaking with the correct person using two identifiers.   Consent: I discussed the limitations, risks, security and privacy concerns of performing an evaluation and management service by telephone and the availability of in person appointments. I also discussed with the patient that there may be a patient responsible charge related to this service. The patient expressed understanding and agreed to proceed.   Location of Patient: Home  Location of Provider: Clinic   Persons participating in Telemedicine visit: LERLINE VALDIVIA Dr. Margarita Rana     History of Present Illness: Heather Rogers  is a 75 year old female with a history of type 2 diabetes mellitus (A1c 6.7), hypertension, gout, COVID-19 in 06/2020 here for follow-up visit   Fasting blood sugars are 130-135 Random sugars are around 102, 107.  At her last visit Janumet was switched to metformin due to cost. Doing well on Metformin and her Statin.  She has not had an eye exam in the last 1 year.  Blood pressures have been around 130/65, 143/69. Tolerating her antihypertensive.  Gout has been stable with no flares and she has been compliant with her Allopurinol. She has no acute concerns.  Past Medical History:  Diagnosis Date   Diabetes ([place])    Hypertension    Stroke (cerebrum) (HCC)    Allergies  Allergen Reactions   Sulfa Antibiotics     Itching     Current Outpatient Medications on File Prior to Visit  Medication Sig Dispense Refill   Accu-Chek FastClix Lancets MISC Check blood sugar fasting and before meals and again if pt feels bad (symptoms of hypo). 100 each 12   allopurinol (ZYLOPRIM) 100 MG tablet Take 1 tablet (100 mg total) by mouth daily. 90 tablet 1   amLODipine (NORVASC) 10 MG tablet Take 1 tablet (10 mg total) by mouth daily. 90  tablet 1   atorvastatin (LIPITOR) 80 MG tablet TAKE ONE TABLET BY MOUTH EVERY EVENING TO LOWER CHOLESTEROL 90 tablet 1   benzonatate (TESSALON PERLES) 100 MG capsule Take 1 capsule (100 mg total) by mouth every 6 (six) hours as needed for cough. (Patient not taking: Reported on 11/17/2020) 30 capsule 0   Blood Glucose Monitoring Suppl (ACCU-CHEK GUIDE) w/Device KIT 1 each by Does not apply route as directed. 1 kit 0   carvedilol (COREG) 25 MG tablet Take 1 tablet (25 mg total) by mouth 2 (two) times daily with a meal. 180 tablet 1   cholecalciferol (VITAMIN D3) 25 MCG (1000 UNIT) tablet Take 1,000 Units by mouth daily.     ferrous sulfate 325 (65 FE) MG EC tablet Take 1 tablet (325 mg total) by mouth in the morning and at bedtime. 60 tablet 1   gabapentin (NEURONTIN) 100 MG capsule Take 1 capsule (100 mg total) by mouth 3 (three) times daily. 270 capsule 0   glucose blood (ACCU-CHEK GUIDE) test strip CHECK BLOOD SUGAR FASTING AND BEFORE MEALS AND AGAIN IF PATIENT FEELS BAD (SYMPTOMS OF HYPO). 100 strip 11   metFORMIN (GLUCOPHAGE) 500 MG tablet Take 1 tablet (500 mg total) by mouth 2 (two) times daily with a meal. 180 tablet 1   STUDY - [place] - apixaban 51m or placebo (PI - Sethi) Take 5 mg by mouth 2 (two) times daily.     Study - [place] - aspirin 877mor Placebo Take  81 mg by mouth daily.     valsartan-hydrochlorothiazide (DIOVAN-HCT) 320-12.5 MG tablet Take 1 tablet by mouth daily. 90 tablet 1   No current facility-administered medications on file prior to visit.    ROS: See HPI  Observations/Objective: Awake, alert, oriented x3 Not in acute distress Normal mood  Lab Results  Component Value Date   HGBA1C 6.7 11/17/2020    CMP Latest Ref Rng & Units 08/17/2020 07/27/2020 07/26/2020  Glucose 65 - 99 mg/dL 131(H) 226(H) 181(H)  BUN 8 - 27 mg/dL 14 35(H) 37(H)  Creatinine 0.57 - 1.00 mg/dL 1.08(H) 1.17(H) 1.16(H)  Sodium 134 - 144 mmol/L 142 137 137  Potassium 3.5 - 5.2 mmol/L 4.4  4.4 4.0  Chloride 96 - 106 mmol/L 105 106 106  CO2 20 - 29 mmol/L 22 21(L) 21(L)  Calcium 8.7 - 10.3 mg/dL 9.4 8.8(L) 9.0  Total Protein 6.0 - 8.5 g/dL 7.0 7.1 7.1  Total Bilirubin 0.0 - 1.2 mg/dL <0.2 0.3 0.2(L)  Alkaline Phos 44 - 121 IU/L 94 52 52  AST 0 - 40 IU/L 13 32 32  ALT 0 - 32 IU/L 12 34 24    Lipid Panel     Component Value Date/Time   CHOL 126 05/16/2018 0420   TRIG 175 (H) 05/16/2018 0420   HDL 27 (L) 05/16/2018 0420   CHOLHDL 4.7 05/16/2018 0420   VLDL 35 05/16/2018 0420   LDLCALC 64 05/16/2018 0420    Assessment and Plan: 1. Type 2 diabetes mellitus with diabetic mononeuropathy, without long-term current use of insulin (HCC) Controlled with A1c of 6.7 Continue metformin Neuropathy is controlled Encouraged to schedule appointment for eye exam Next A1c due in 3 months Counseled on Diabetic diet, my plate method, 235 minutes of moderate intensity exercise/week Blood sugar logs with fasting goals of 80-120 mg/dl, random of less than 180 and in the event of sugars less than 60 mg/dl or greater than 400 mg/dl encouraged to notify the clinic. Advised on the need for annual eye exams, annual foot exams, Pneumonia vaccine.  2. Hypertension associated with diabetes ([place]) Controlled based on ambulatory blood pressures Continue antihypertensive Counseled on blood pressure goal of less than 130/80, low-sodium, DASH diet, medication compliance, 150 minutes of moderate intensity exercise per week. Discussed medication compliance, adverse effects.  3. Idiopathic chronic gout without tophus, unspecified site No acute flares Continue allopurinol   Follow Up Instructions: Return in about 3 months (around 05/17/2021) for Diabetes.    I discussed the assessment and treatment plan with the patient. The patient was provided an opportunity to ask questions and all were answered. The patient agreed with the plan and demonstrated an understanding of the instructions.   The  patient was advised to call back or seek an in-person evaluation if the symptoms worsen or if the condition fails to improve as anticipated.     I provided 11 minutes total of non-face-to-face time during this encounter.   Charlott Rakes, MD, FAAFP. [facility] [place], [place]   02/14/2021, 11:22 AM

## 2021-04-11 ENCOUNTER — Telehealth: Payer: Self-pay

## 2021-04-11 NOTE — Telephone Encounter (Signed)
Called pt to schedule Annual Wellness Visit, No answer left message to call [facility] to schedule at [phone]  

## 2021-05-08 ENCOUNTER — Other Ambulatory Visit: Payer: Self-pay | Admitting: Family

## 2021-05-08 ENCOUNTER — Other Ambulatory Visit: Payer: Self-pay | Admitting: Family Medicine

## 2021-05-08 DIAGNOSIS — E1141 Type 2 diabetes mellitus with diabetic mononeuropathy: Secondary | ICD-10-CM

## 2021-05-10 ENCOUNTER — Other Ambulatory Visit: Payer: Self-pay | Admitting: Family Medicine

## 2021-05-10 DIAGNOSIS — E1159 Type 2 diabetes mellitus with other circulatory complications: Secondary | ICD-10-CM

## 2021-05-10 DIAGNOSIS — I152 Hypertension secondary to endocrine disorders: Secondary | ICD-10-CM

## 2021-05-10 NOTE — Telephone Encounter (Signed)
Requested Prescriptions  Pending Prescriptions Disp Refills  . valsartan-hydrochlorothiazide (DIOVAN-HCT) 320-12.5 MG tablet [Pharmacy Med Name: VALSARTAN-HCTZ 320-12.5 MG TAB] 90 tablet 1    Sig: TAKE ONE TABLET BY MOUTH DAILY     Cardiovascular: ARB + Diuretic Combos Failed - 05/10/2021 12:13 PM      Failed - K in normal range and within 180 days    Potassium  Date Value Ref Range Status  08/17/2020 4.4 3.5 - 5.2 mmol/L Final         Failed - Na in normal range and within 180 days    Sodium  Date Value Ref Range Status  08/17/2020 142 134 - 144 mmol/L Final         Failed - Cr in normal range and within 180 days    Creatinine, Ser  Date Value Ref Range Status  08/17/2020 1.08 (H) 0.57 - 1.00 mg/dL Final   Creatinine, Urine  Date Value Ref Range Status  07/25/2020 255.19 mg/dL Final    Comment:    Performed at [facility], [place] [address] [place], [place] [address]         Failed - Ca in normal range and within 180 days    Calcium  Date Value Ref Range Status  08/17/2020 9.4 8.7 - 10.3 mg/dL Final   Calcium, Ion  Date Value Ref Range Status  05/15/2018 1.19 1.15 - 1.40 mmol/L Final         Passed - Patient is not pregnant      Passed - Last BP in normal range    BP Readings from Last 1 Encounters:  11/17/20 130/79         Passed - Valid encounter within last 6 months    Recent Outpatient Visits          2 months ago Type 2 diabetes mellitus with diabetic mononeuropathy, without long-term current use of insulin ([place])   [place], [place], MD   5 months ago Type 2 diabetes mellitus with diabetic mononeuropathy, without long-term current use of insulin ([place])   [place], [place], MD   8 months ago Type 2 diabetes mellitus with diabetic mononeuropathy, without long-term current use of insulin ([place])   [place], Charlane Ferretti, MD   1 year ago  Physical exam   [place], [place] J, NP   1 year ago Type 2 diabetes mellitus treated without insulin ([place])   [place], [place], MD             . allopurinol (ZYLOPRIM) 100 MG tablet [Pharmacy Med Name: ALLOPURINOL 100 MG TABLET] 90 tablet 1    Sig: TAKE ONE TABLET BY MOUTH DAILY     Endocrinology:  Gout Agents Failed - 05/10/2021 12:13 PM      Failed - Uric Acid in normal range and within 360 days    Uric Acid  Date Value Ref Range Status  08/20/2018 6.2 2.5 - 7.1 mg/dL Final    Comment:               Therapeutic target for gout patients: <6.0         Failed - Cr in normal range and within 360 days    Creatinine, Ser  Date Value Ref Range Status  08/17/2020 1.08 (H) 0.57 - 1.00 mg/dL Final   Creatinine, Urine  Date Value  Ref Range Status  07/25/2020 255.19 mg/dL Final    Comment:    Performed at [facility], [place] [address], [place], [place] [address]         Passed - Valid encounter within last 12 months    Recent Outpatient Visits          2 months ago Type 2 diabetes mellitus with diabetic mononeuropathy, without long-term current use of insulin ([place])   [place], [place], MD   5 months ago Type 2 diabetes mellitus with diabetic mononeuropathy, without long-term current use of insulin ([place])   [place], [place], MD   8 months ago Type 2 diabetes mellitus with diabetic mononeuropathy, without long-term current use of insulin ([place])   [place], [place], MD   1 year ago Physical exam   [place], [place] J, NP   1 year ago Type 2 diabetes mellitus treated without insulin [facility])   [facility] Antony Blackbird, MD

## 2021-05-10 NOTE — Telephone Encounter (Signed)
Requested medications are due for refill today.  yes  Requested medications are on the active medications list.  yes  Last refill. 11/17/2020  Future visit scheduled.   no  Notes to clinic.  Labs are expired.

## 2021-05-15 ENCOUNTER — Other Ambulatory Visit: Payer: Self-pay | Admitting: Family Medicine

## 2021-05-15 DIAGNOSIS — E1141 Type 2 diabetes mellitus with diabetic mononeuropathy: Secondary | ICD-10-CM

## 2021-05-15 NOTE — Telephone Encounter (Signed)
Appointment scheduled for follow up as per last office note.  Requested Prescriptions  Pending Prescriptions Disp Refills  . metFORMIN (GLUCOPHAGE) 500 MG tablet [Pharmacy Med Name: metFORMIN HCL 500 MG TABLET] 180 tablet 1    Sig: TAKE ONE TABLET BY MOUTH TWICE A DAY WITH A MEAL     Endocrinology:  Diabetes - Biguanides Failed - 05/15/2021  6:11 AM      Failed - Cr in normal range and within 360 days    Creatinine, Ser  Date Value Ref Range Status  08/17/2020 1.08 (H) 0.57 - 1.00 mg/dL Final   Creatinine, Urine  Date Value Ref Range Status  07/25/2020 255.19 mg/dL Final    Comment:    Performed at [facility], [place] [address], [place],  [address]         Failed - AA eGFR in normal range and within 360 days    GFR calc Af Amer  Date Value Ref Range Status  08/17/2020 58 (L) >59 mL/min/1.73 Final    Comment:    **In accordance with recommendations from the NKF-ASN Task force,**   [facility] is in the process of updating its eGFR calculation to the   2021 CKD-EPI creatinine equation that estimates kidney function   without a race variable.    GFR, Estimated  Date Value Ref Range Status  07/27/2020 49 (L) >60 mL/min Final    Comment:    (NOTE) Calculated using the CKD-EPI Creatinine Equation (2021)    GFR calc non Af Amer  Date Value Ref Range Status  08/17/2020 51 (L) >59 mL/min/1.73 Final         Passed - HBA1C is between 0 and 7.9 and within 180 days    HbA1c, POC (controlled diabetic range)  Date Value Ref Range Status  11/17/2020 6.7 0.0 - 7.0 % Final         Passed - Valid encounter within last 6 months    Recent Outpatient Visits          3 months ago Type 2 diabetes mellitus with diabetic mononeuropathy, without long-term current use of insulin ([place])   [place], Allison, MD   5 months ago Type 2 diabetes mellitus with diabetic mononeuropathy, without long-term current use of insulin ([place])   [place], [place], MD   9 months ago Type 2 diabetes mellitus with diabetic mononeuropathy, without long-term current use of insulin ([place])   [place], [place]obong, MD   1 year ago Physical exam   [place], [place] J, NP   1 year ago Type 2 diabetes mellitus treated without insulin ([place])   [place], [place]ief, MD      Future Appointments            In 1 week [place], Loraine Grip, PA-C [place]

## 2021-05-16 ENCOUNTER — Other Ambulatory Visit: Payer: Self-pay | Admitting: Family

## 2021-05-16 DIAGNOSIS — E1141 Type 2 diabetes mellitus with diabetic mononeuropathy: Secondary | ICD-10-CM

## 2021-05-22 ENCOUNTER — Ambulatory Visit: Payer: Medicare HMO | Attending: Physician Assistant | Admitting: Physician Assistant

## 2021-05-22 ENCOUNTER — Encounter: Payer: Self-pay | Admitting: Physician Assistant

## 2021-05-22 ENCOUNTER — Other Ambulatory Visit: Payer: Self-pay

## 2021-05-22 VITALS — BP 161/70 | HR 75 | Temp 98.8°F | Resp 18 | Ht 62.0 in | Wt 149.0 lb

## 2021-05-22 DIAGNOSIS — Z8673 Personal history of transient ischemic attack (TIA), and cerebral infarction without residual deficits: Secondary | ICD-10-CM | POA: Diagnosis not present

## 2021-05-22 DIAGNOSIS — D649 Anemia, unspecified: Secondary | ICD-10-CM

## 2021-05-22 DIAGNOSIS — I152 Hypertension secondary to endocrine disorders: Secondary | ICD-10-CM

## 2021-05-22 DIAGNOSIS — E1159 Type 2 diabetes mellitus with other circulatory complications: Secondary | ICD-10-CM | POA: Diagnosis not present

## 2021-05-22 DIAGNOSIS — E1141 Type 2 diabetes mellitus with diabetic mononeuropathy: Secondary | ICD-10-CM

## 2021-05-22 DIAGNOSIS — I672 Cerebral atherosclerosis: Secondary | ICD-10-CM | POA: Diagnosis not present

## 2021-05-22 DIAGNOSIS — Z23 Encounter for immunization: Secondary | ICD-10-CM | POA: Diagnosis not present

## 2021-05-22 DIAGNOSIS — Z6827 Body mass index (BMI) 27.0-27.9, adult: Secondary | ICD-10-CM | POA: Diagnosis not present

## 2021-05-22 DIAGNOSIS — M1A00X0 Idiopathic chronic gout, unspecified site, without tophus (tophi): Secondary | ICD-10-CM

## 2021-05-22 LAB — POCT GLYCOSYLATED HEMOGLOBIN (HGB A1C): Hemoglobin A1C: 6.2 % — AB (ref 4.0–5.6)

## 2021-05-22 MED ORDER — VALSARTAN-HYDROCHLOROTHIAZIDE 320-12.5 MG PO TABS: 1.0000 | ORAL_TABLET | Freq: Every day | ORAL | 1 refills | Status: DC

## 2021-05-22 MED ORDER — METFORMIN HCL 500 MG PO TABS: ORAL_TABLET | ORAL | 1 refills | Status: DC

## 2021-05-22 MED ORDER — GABAPENTIN 100 MG PO CAPS: 100.0000 mg | ORAL_CAPSULE | Freq: Three times a day (TID) | ORAL | 1 refills | Status: DC

## 2021-05-22 MED ORDER — ACCU-CHEK FASTCLIX LANCETS MISC: 12 refills | Status: DC

## 2021-05-22 MED ORDER — FERROUS SULFATE 325 (65 FE) MG PO TBEC: 325.0000 mg | DELAYED_RELEASE_TABLET | Freq: Two times a day (BID) | ORAL | 1 refills | Status: DC

## 2021-05-22 MED ORDER — AMLODIPINE BESYLATE 10 MG PO TABS: 10.0000 mg | ORAL_TABLET | Freq: Every day | ORAL | 1 refills | Status: DC

## 2021-05-22 MED ORDER — ATORVASTATIN CALCIUM 80 MG PO TABS: ORAL_TABLET | ORAL | 1 refills | Status: DC

## 2021-05-22 MED ORDER — CARVEDILOL 25 MG PO TABS: 25.0000 mg | ORAL_TABLET | Freq: Two times a day (BID) | ORAL | 1 refills | Status: DC

## 2021-05-22 NOTE — Progress Notes (Signed)
Patient has eaten and taken medication today. Patient denies pain at this time. Patient request refills in test strips.

## 2021-05-22 NOTE — Patient Instructions (Addendum)
I do encourage you to continue checking your blood pressure and blood sugar levels on a daily basis, keep up the great work.  We will call you with your lab results once they are available.  Please let us know if there is anything else we can do for you.  Kennieth Rad, PA-C Physician Assistant [facility] [email or web address]   Low-Sodium Eating Plan Sodium, which is an element that makes up salt, helps you maintain a healthy balance of fluids in your body. Too much sodium can increase your blood pressure and cause fluid and waste to be held in your body. Your health care provider or dietitian may recommend following this plan if you have high blood pressure (hypertension), kidney disease, liver disease, or heart failure. Eating less sodium can help lower your blood pressure, reduce swelling, and protect your heart, liver, and kidneys. What are tips for following this plan? Reading food labels The Nutrition Facts label lists the amount of sodium in one serving of the food. If you eat more than one serving, you must multiply the listed amount of sodium by the number of servings. Choose foods with less than 140 mg of sodium per serving. Avoid foods with 300 mg of sodium or more per serving. Shopping  Look for lower-sodium products, often labeled as "low-sodium" or "no salt added." Always check the sodium content, even if foods are labeled as "unsalted" or "no salt added." Buy fresh foods. Avoid canned foods and pre-made or frozen meals. Avoid canned, cured, or processed meats. Buy breads that have less than 80 mg of sodium per slice. Cooking  Eat more home-cooked food and less restaurant, buffet, and fast food. Avoid adding salt when cooking. Use salt-free seasonings or herbs instead of table salt or sea salt. Check with your health care provider or pharmacist before using salt substitutes. Cook with plant-based oils, such as  canola, sunflower, or olive oil. Meal planning When eating at a restaurant, ask that your food be prepared with less salt or no salt, if possible. Avoid dishes labeled as brined, pickled, cured, smoked, or made with soy sauce, miso, or teriyaki sauce. Avoid foods that contain MSG (monosodium glutamate). MSG is sometimes added to [place] food, bouillon, and some canned foods. Make meals that can be grilled, baked, poached, roasted, or steamed. These are generally made with less sodium. General information Most people on this plan should limit their sodium intake to 1,500-2,000 mg (milligrams) of sodium each day. What foods should I eat? Fruits Fresh, frozen, or canned fruit. Fruit juice. Vegetables Fresh or frozen vegetables. "No salt added" canned vegetables. "No salt added" tomato sauce and paste. Low-sodium or reduced-sodium tomato and vegetable juice. Grains Low-sodium cereals, including oats, puffed wheat and rice, and shredded wheat. Low-sodium crackers. Unsalted rice. Unsalted pasta. Low-sodium bread. Whole-grain breads and whole-grain pasta. Meats and other proteins Fresh or frozen (no salt added) meat, poultry, seafood, and fish. Low-sodium canned tuna and salmon. Unsalted nuts. Dried peas, beans, and lentils without added salt. Unsalted canned beans. Eggs. Unsalted nut butters. Dairy Milk. Soy milk. Cheese that is naturally low in sodium, such as ricotta cheese, fresh mozzarella, or Swiss cheese. Low-sodium or reduced-sodium cheese. Cream cheese. Yogurt. Seasonings and condiments Fresh and dried herbs and spices. Salt-free seasonings. Low-sodium mustard and ketchup. Sodium-free salad dressing. Sodium-free light mayonnaise. Fresh or refrigerated horseradish. Lemon juice. Vinegar. Other foods Homemade, reduced-sodium, or low-sodium soups. Unsalted popcorn and pretzels. Low-salt or salt-free chips. The items listed above may not  be a complete list of foods and beverages you can eat.  Contact a dietitian for more information. What foods should I avoid? Vegetables Sauerkraut, pickled vegetables, and relishes. Olives. [place] fries. Onion rings. Regular canned vegetables (not low-sodium or reduced-sodium). Regular canned tomato sauce and paste (not low-sodium or reduced-sodium). Regular tomato and vegetable juice (not low-sodium or reduced-sodium). Frozen vegetables in sauces. Grains Instant hot cereals. Bread stuffing, pancake, and biscuit mixes. Croutons. Seasoned rice or pasta mixes. Noodle soup cups. Boxed or frozen macaroni and cheese. Regular salted crackers. Self-rising flour. Meats and other proteins Meat or fish that is salted, canned, smoked, spiced, or pickled. Precooked or cured meat, such as sausages or meat loaves. Berniece Salines. Ham. Pepperoni. Hot dogs. Corned beef. Chipped beef. Salt pork. Jerky. Pickled herring. Anchovies and sardines. Regular canned tuna. Salted nuts. Dairy Processed cheese and cheese spreads. Hard cheeses. Cheese curds. Blue cheese. Feta cheese. String cheese. Regular cottage cheese. Buttermilk. Canned milk. Fats and oils Salted butter. Regular margarine. Ghee. Bacon fat. Seasonings and condiments Onion salt, garlic salt, seasoned salt, table salt, and sea salt. Canned and packaged gravies. Worcestershire sauce. Tartar sauce. Barbecue sauce. Teriyaki sauce. Soy sauce, including reduced-sodium. Steak sauce. Fish sauce. Oyster sauce. Cocktail sauce. Horseradish that you find on the shelf. Regular ketchup and mustard. Meat flavorings and tenderizers. Bouillon cubes. Hot sauce. Pre-made or packaged marinades. Pre-made or packaged taco seasonings. Relishes. Regular salad dressings. Salsa. Other foods Salted popcorn and pretzels. Corn chips and puffs. Potato and tortilla chips. Canned or dried soups. Pizza. Frozen entrees and pot pies. The items listed above may not be a complete list of foods and beverages you should avoid. Contact a dietitian for more  information. Summary Eating less sodium can help lower your blood pressure, reduce swelling, and protect your heart, liver, and kidneys. Most people on this plan should limit their sodium intake to 1,500-2,000 mg (milligrams) of sodium each day. Canned, boxed, and frozen foods are high in sodium. Restaurant foods, fast foods, and pizza are also very high in sodium. You also get sodium by adding salt to food. Try to cook at home, eat more fresh fruits and vegetables, and eat less fast food and canned, processed, or prepared foods. This information is not intended to replace advice given to you by your health care provider. Make sure you discuss any questions you have with your health care provider. Document Revised: 08/21/2019 Document Reviewed: 06/17/2019 Elsevier Patient Education  2022 [employer].

## 2021-05-22 NOTE — Progress Notes (Signed)
Established Patient Office Visit  Subjective:  Patient ID: Heather Rogers, female    DOB: 15-Mar-1946  Age: 75 y.o. MRN: [id number]  CC:  Chief Complaint  Patient presents with   Follow-up    DM    HPI Heather Rogers presents for medication refills.  Reports that she has been checking her blood glucose levels at home, states that they are generally within normal limits.  Denies any hypoglycemia or hypoglycemic episodes.  Reports that she has been checking her blood pressure at home, states readings within normal limits.  No other concerns at this time  Daughter is present and helps with history.  Past Medical History:  Diagnosis Date   Diabetes ([place])    Hypertension    Stroke (cerebrum) [facility])     Past Surgical History:  Procedure Laterality Date   EYE SURGERY Left 04/06/2020   Implant placed   EYE SURGERY Right 03/30/2020   Implant placed    hysterectomy     LOOP RECORDER INSERTION N/A 05/20/2018   Procedure: LOOP RECORDER INSERTION;  Surgeon: Thompson Grayer, MD;  Location: [facility];  Service: Cardiovascular;  Laterality: N/A;   TEE WITHOUT CARDIOVERSION N/A 05/20/2018   Procedure: TRANSESOPHAGEAL ECHOCARDIOGRAM (TEE);  Surgeon: Sueanne Margarita, MD;  Location: [facility] ENDOSCOPY;  Service: Cardiovascular;  Laterality: N/A;    Family History  Problem Relation Age of Onset   Hypertension Mother    Stroke Mother    Hypertension Father     Social History   Socioeconomic History   Marital status: Divorced    Spouse name: Not on file   Number of children: Not on file   Years of education: Not on file   Highest education level: Not on file  Occupational History   Not on file  Tobacco Use   Smoking status: Former   Smokeless tobacco: Never  Substance and Sexual Activity   Alcohol use: Never   Drug use: Never   Sexual activity: Not Currently  Other Topics Concern   Not on file  Social History Narrative   Lives in [place].   Social  Determinants of Health   Financial Resource Strain: Not on file  Food Insecurity: Not on file  Transportation Needs: Not on file  Physical Activity: Not on file  Stress: Not on file  Social Connections: Not on file  Intimate Partner Violence: Not on file    Outpatient Medications Prior to Visit  Medication Sig Dispense Refill   allopurinol (ZYLOPRIM) 100 MG tablet TAKE ONE TABLET BY MOUTH DAILY 90 tablet 1   Blood Glucose Monitoring Suppl (ACCU-CHEK GUIDE) w/Device KIT 1 each by Does not apply route as directed. 1 kit 0   cholecalciferol (VITAMIN D3) 25 MCG (1000 UNIT) tablet Take 1,000 Units by mouth daily.     glucose blood (ACCU-CHEK GUIDE) test strip USE TO CHECK FASTING BLOOD SUGAR AND BEFORE MEALS AND AGAIN IF PATIENT FEELS BAD; SYMPTOMS OF HYPOGLYCEMIA 100 strip 11   STUDY - [place] - apixaban 52m or placebo (PI - Sethi) Take 5 mg by mouth 2 (two) times daily.     Study - [place] - aspirin 880mor Placebo Take 81 mg by mouth daily.     Accu-Chek FastClix Lancets MISC Check blood sugar fasting and before meals and again if pt feels bad (symptoms of hypo). 100 each 12   amLODipine (NORVASC) 10 MG tablet Take 1 tablet (10 mg total) by mouth daily. 90 tablet 1   atorvastatin (LIPITOR)  80 MG tablet TAKE ONE TABLET BY MOUTH EVERY EVENING TO LOWER CHOLESTEROL 90 tablet 1   benzonatate (TESSALON PERLES) 100 MG capsule Take 1 capsule (100 mg total) by mouth every 6 (six) hours as needed for cough. 30 capsule 0   carvedilol (COREG) 25 MG tablet Take 1 tablet (25 mg total) by mouth 2 (two) times daily with a meal. 180 tablet 1   ferrous sulfate 325 (65 FE) MG EC tablet Take 1 tablet (325 mg total) by mouth in the morning and at bedtime. 60 tablet 1   gabapentin (NEURONTIN) 100 MG capsule TAKE ONE CAPSULE BY MOUTH THREE TIMES A DAY 270 capsule 0   metFORMIN (GLUCOPHAGE) 500 MG tablet TAKE ONE TABLET BY MOUTH TWICE A DAY WITH A MEAL 180 tablet 0   valsartan-hydrochlorothiazide (DIOVAN-HCT)  320-12.5 MG tablet TAKE ONE TABLET BY MOUTH DAILY 30 tablet 0   No facility-administered medications prior to visit.    Allergies  Allergen Reactions   Sulfa Antibiotics     Itching     ROS Review of Systems  Constitutional: Negative.   HENT: Negative.    Eyes: Negative.   Respiratory:  Negative for shortness of breath.   Cardiovascular:  Negative for chest pain.  Gastrointestinal: Negative.   Endocrine: Negative.   Genitourinary: Negative.   Musculoskeletal: Negative.   Skin: Negative.   Allergic/Immunologic: Negative.   Neurological: Negative.   Hematological: Negative.   Psychiatric/Behavioral: Negative.       Objective:    Physical Exam Vitals and nursing note reviewed.  Constitutional:      Appearance: Normal appearance.  HENT:     Head: Normocephalic and atraumatic.     Right Ear: External ear normal.     Left Ear: External ear normal.     Nose: Nose normal.     Mouth/Throat:     Mouth: Mucous membranes are moist.     Pharynx: Oropharynx is clear.  Eyes:     Extraocular Movements: Extraocular movements intact.     Conjunctiva/sclera: Conjunctivae normal.     Pupils: Pupils are equal, round, and reactive to light.  Cardiovascular:     Rate and Rhythm: Normal rate and regular rhythm.     Pulses: Normal pulses.     Heart sounds: Normal heart sounds.  Pulmonary:     Effort: Pulmonary effort is normal.     Breath sounds: Normal breath sounds.  Musculoskeletal:        General: Normal range of motion.     Cervical back: Normal range of motion and neck supple.  Skin:    General: Skin is warm and dry.  Neurological:     Mental Status: She is alert and oriented to person, place, and time.  Psychiatric:        Mood and Affect: Mood normal.        Behavior: Behavior normal.        Thought Content: Thought content normal.        Judgment: Judgment normal.    BP (!) 161/70 (BP Location: Left Arm, Patient Position: Sitting, Cuff Size: Normal)   Pulse 75    Temp 98.8 F (37.1 C) (Oral)   Resp 18   Ht _0  (1.575 m)   Wt 149 lb (67.6 kg)   SpO2 98%   BMI 27.25 kg/m  Wt Readings from Last 3 Encounters:  05/22/21 149 lb (67.6 kg)  11/17/20 157 lb 9.6 oz (71.5 kg)  08/17/20 158 lb 12.8 oz (72 kg)  Health Maintenance Due  Topic Date Due   FOOT EXAM  Never done   OPHTHALMOLOGY EXAM  Never done   Hepatitis C Screening  Never done   Zoster Vaccines- Shingrix (1 of 2) Never done   COLONOSCOPY (Pts 45-31yr Insurance coverage will need to be confirmed)  Never done   DEXA SCAN  Never done   COVID-19 Vaccine (2 - Janssen risk series) 12/01/2019    There are no preventive care reminders to display for this patient.  No results found for: TSH Lab Results  Component Value Date   WBC 5.2 08/17/2020   HGB 10.1 (L) 08/17/2020   HCT 31.2 (L) 08/17/2020   MCV 71 (L) 08/17/2020   PLT 238 08/17/2020   Lab Results  Component Value Date   NA 142 08/17/2020   K 4.4 08/17/2020   CO2 22 08/17/2020   GLUCOSE 131 (H) 08/17/2020   BUN 14 08/17/2020   CREATININE 1.08 (H) 08/17/2020   BILITOT <0.2 08/17/2020   ALKPHOS 94 08/17/2020   AST 13 08/17/2020   ALT 12 08/17/2020   PROT 7.0 08/17/2020   ALBUMIN 3.8 08/17/2020   CALCIUM 9.4 08/17/2020   ANIONGAP 10 07/27/2020   Lab Results  Component Value Date   CHOL 126 05/16/2018   Lab Results  Component Value Date   HDL 27 (L) 05/16/2018   Lab Results  Component Value Date   LDLCALC 64 05/16/2018   Lab Results  Component Value Date   TRIG 175 (H) 05/16/2018   Lab Results  Component Value Date   CHOLHDL 4.7 05/16/2018   Lab Results  Component Value Date   HGBA1C 6.2 (A) 05/22/2021      Assessment & Plan:   Problem List Items Addressed This Visit       Cardiovascular and Mediastinum   Hypertension associated with diabetes [place]   Relevant Medications   amLODipine (NORVASC) 10 MG tablet   atorvastatin (LIPITOR) 80 MG tablet   carvedilol (COREG) 25 MG tablet    metFORMIN (GLUCOPHAGE) 500 MG tablet   valsartan-hydrochlorothiazide (DIOVAN-HCT) 320-12.5 MG tablet   Atherosclerotic cerebrovascular disease   Relevant Medications   amLODipine (NORVASC) 10 MG tablet   atorvastatin (LIPITOR) 80 MG tablet   carvedilol (COREG) 25 MG tablet   valsartan-hydrochlorothiazide (DIOVAN-HCT) 320-12.5 MG tablet   Other Relevant Orders   Lipid panel     Endocrine   Type 2 diabetes mellitus with diabetic mononeuropathy, without long-term current use of insulin (HCC) - Primary   Relevant Medications   atorvastatin (LIPITOR) 80 MG tablet   gabapentin (NEURONTIN) 100 MG capsule   metFORMIN (GLUCOPHAGE) 500 MG tablet   valsartan-hydrochlorothiazide (DIOVAN-HCT) 320-12.5 MG tablet   Accu-Chek FastClix Lancets MISC   Other Relevant Orders   POCT A1C (Completed)   CBC with Differential/Platelet   Comp. Metabolic Panel (12)   TSH     Other   Status post CVA   Relevant Medications   atorvastatin (LIPITOR) 80 MG tablet   Gout   Relevant Orders   Uric Acid   Anemia   Relevant Medications   ferrous sulfate 325 (65 FE) MG EC tablet   Other Relevant Orders   Iron, TIBC and Ferritin Panel   Other Visit Diagnoses     BMI 27.0-27.9,adult           Meds ordered this encounter  Medications   amLODipine (NORVASC) 10 MG tablet    Sig: Take 1 tablet (10 mg total) by mouth daily.  Dispense:  90 tablet    Refill:  1    Order Specific Question:   Supervising Provider    Answer:   Asencion Noble E [[id number]]   atorvastatin (LIPITOR) 80 MG tablet    Sig: TAKE ONE TABLET BY MOUTH EVERY EVENING TO LOWER CHOLESTEROL    Dispense:  90 tablet    Refill:  1    Order Specific Question:   Supervising Provider    Answer:   Asencion Noble E [[id number]]   carvedilol (COREG) 25 MG tablet    Sig: Take 1 tablet (25 mg total) by mouth 2 (two) times daily with a meal.    Dispense:  180 tablet    Refill:  1    Order Specific Question:   Supervising Provider    Answer:   Asencion Noble E [[id number]]   ferrous sulfate 325 (65 FE) MG EC tablet    Sig: Take 1 tablet (325 mg total) by mouth in the morning and at bedtime.    Dispense:  60 tablet    Refill:  1    Order Specific Question:   Supervising Provider    Answer:   Asencion Noble E [[id number]]   gabapentin (NEURONTIN) 100 MG capsule    Sig: Take 1 capsule (100 mg total) by mouth 3 (three) times daily.    Dispense:  270 capsule    Refill:  1    Order Specific Question:   Supervising Provider    Answer:   Asencion Noble E [[id number]]   metFORMIN (GLUCOPHAGE) 500 MG tablet    Sig: TAKE ONE TABLET BY MOUTH TWICE A DAY WITH A MEAL    Dispense:  180 tablet    Refill:  1    Order Specific Question:   Supervising Provider    Answer:   Asencion Noble E [[id number]]   valsartan-hydrochlorothiazide (DIOVAN-HCT) 320-12.5 MG tablet    Sig: Take 1 tablet by mouth daily.    Dispense:  90 tablet    Refill:  1    Order Specific Question:   Supervising Provider    Answer:   Asencion Noble E [[id number]]   Accu-Chek FastClix Lancets MISC    Sig: Check blood sugar fasting and before meals and again if pt feels bad (symptoms of hypo).    Dispense:  100 each    Refill:  12    Order Specific Question:   Supervising Provider    Answer:   Joya Gaskins, PATRICK E [[id number]]  1. Type 2 diabetes mellitus with diabetic mononeuropathy, without long-term current use of insulin (HCC) A1C 6.2 continue current regimen, patient encouraged to continue checking blood glucose levels at home, keeping a written log and have it available for all office visits.  Patient given 72-month follow-up with primary care provider, fasting lab appointment next week.  - POCT A1C - CBC with Differential/Platelet; Future - Comp. Metabolic Panel (12); Future - TSH; Future - gabapentin (NEURONTIN) 100 MG capsule; Take 1 capsule (100 mg total) by mouth 3 (three) times daily.  Dispense: 270 capsule; Refill: 1 - metFORMIN (GLUCOPHAGE) 500 MG tablet; TAKE ONE TABLET BY MOUTH TWICE A DAY  WITH A MEAL  Dispense: 180 tablet; Refill: 1 - Accu-Chek FastClix Lancets MISC; Check blood sugar fasting and before meals and again if pt feels bad (symptoms of hypo).  Dispense: 100 each; Refill: 12  2. Hypertension associated with diabetes ([place]) Continue current regimen, patient encouraged to continue checking blood pressure at home, keeping a written log and having  available for all office visits.  Red flags given for prompt reevaluation  - amLODipine (NORVASC) 10 MG tablet; Take 1 tablet (10 mg total) by mouth daily.  Dispense: 90 tablet; Refill: 1 - carvedilol (COREG) 25 MG tablet; Take 1 tablet (25 mg total) by mouth 2 (two) times daily with a meal.  Dispense: 180 tablet; Refill: 1 - valsartan-hydrochlorothiazide (DIOVAN-HCT) 320-12.5 MG tablet; Take 1 tablet by mouth daily.  Dispense: 90 tablet; Refill: 1  3. Status post CVA Continue current regimen - atorvastatin (LIPITOR) 80 MG tablet; TAKE ONE TABLET BY MOUTH EVERY EVENING TO LOWER CHOLESTEROL  Dispense: 90 tablet; Refill: 1  4. Atherosclerotic cerebrovascular disease Continue current regimen - Lipid panel; Future - atorvastatin (LIPITOR) 80 MG tablet; TAKE ONE TABLET BY MOUTH EVERY EVENING TO LOWER CHOLESTEROL  Dispense: 90 tablet; Refill: 1  5. Idiopathic chronic gout without tophus, unspecified site  - Uric Acid; Future  6. Anemia, unspecified type Continue current regimen - Iron, TIBC and Ferritin Panel; Future - ferrous sulfate 325 (65 FE) MG EC tablet; Take 1 tablet (325 mg total) by mouth in the morning and at bedtime.  Dispense: 60 tablet; Refill: 1   I have reviewed the patient's medical history (PMH, PSH, Social History, Family History, Medications, and allergies) , and have been updated if relevant. I spent 30 minutes reviewing chart and  face to face time with patient.     Follow-up: Return in about 3 months (around 08/22/2021) for At [facility].    Loraine Grip Mayers, PA-C

## 2021-05-23 ENCOUNTER — Other Ambulatory Visit: Payer: Medicare HMO

## 2021-05-23 DIAGNOSIS — I672 Cerebral atherosclerosis: Secondary | ICD-10-CM | POA: Insufficient documentation

## 2021-05-23 DIAGNOSIS — D638 Anemia in other chronic diseases classified elsewhere: Secondary | ICD-10-CM | POA: Insufficient documentation

## 2021-05-23 DIAGNOSIS — D649 Anemia, unspecified: Secondary | ICD-10-CM | POA: Insufficient documentation

## 2021-05-24 ENCOUNTER — Ambulatory Visit: Payer: Medicare HMO | Attending: Family Medicine

## 2021-05-24 ENCOUNTER — Other Ambulatory Visit: Payer: Self-pay

## 2021-05-24 DIAGNOSIS — E1141 Type 2 diabetes mellitus with diabetic mononeuropathy: Secondary | ICD-10-CM | POA: Diagnosis not present

## 2021-05-24 DIAGNOSIS — M1A00X0 Idiopathic chronic gout, unspecified site, without tophus (tophi): Secondary | ICD-10-CM | POA: Diagnosis not present

## 2021-05-24 DIAGNOSIS — D649 Anemia, unspecified: Secondary | ICD-10-CM

## 2021-05-24 DIAGNOSIS — I672 Cerebral atherosclerosis: Secondary | ICD-10-CM | POA: Diagnosis not present

## 2021-05-25 LAB — CBC WITH DIFFERENTIAL/PLATELET
Basophils Absolute: 0 10*3/uL (ref 0.0–0.2)
Basos: 1 %
EOS (ABSOLUTE): 0.1 10*3/uL (ref 0.0–0.4)
Eos: 1 %
Hematocrit: 32.4 % — ABNORMAL LOW (ref 34.0–46.6)
Hemoglobin: 10.3 g/dL — ABNORMAL LOW (ref 11.1–15.9)
Immature Grans (Abs): 0 10*3/uL (ref 0.0–0.1)
Immature Granulocytes: 0 %
Lymphocytes Absolute: 1.6 10*3/uL (ref 0.7–3.1)
Lymphs: 27 %
MCH: 22.9 pg — ABNORMAL LOW (ref 26.6–33.0)
MCHC: 31.8 g/dL (ref 31.5–35.7)
MCV: 72 fL — ABNORMAL LOW (ref 79–97)
Monocytes Absolute: 0.6 10*3/uL (ref 0.1–0.9)
Monocytes: 10 %
Neutrophils Absolute: 3.6 10*3/uL (ref 1.4–7.0)
Neutrophils: 61 %
Platelets: 345 10*3/uL (ref 150–450)
RBC: 4.5 x10E6/uL (ref 3.77–5.28)
RDW: 14.4 % (ref 11.7–15.4)
WBC: 6 10*3/uL (ref 3.4–10.8)

## 2021-05-25 LAB — COMP. METABOLIC PANEL (12)
AST: 13 IU/L (ref 0–40)
Albumin/Globulin Ratio: 1.5 (ref 1.2–2.2)
Albumin: 4.3 g/dL (ref 3.7–4.7)
Alkaline Phosphatase: 82 IU/L (ref 44–121)
BUN/Creatinine Ratio: 23 (ref 12–28)
BUN: 25 mg/dL (ref 8–27)
Bilirubin Total: 0.2 mg/dL (ref 0.0–1.2)
Calcium: 9.9 mg/dL (ref 8.7–10.3)
Chloride: 102 mmol/L (ref 96–106)
Creatinine, Ser: 1.07 mg/dL — ABNORMAL HIGH (ref 0.57–1.00)
Globulin, Total: 2.9 g/dL (ref 1.5–4.5)
Glucose: 143 mg/dL — ABNORMAL HIGH (ref 70–99)
Potassium: 4.7 mmol/L (ref 3.5–5.2)
Sodium: 140 mmol/L (ref 134–144)
Total Protein: 7.2 g/dL (ref 6.0–8.5)
eGFR: 54 mL/min/{1.73_m2} — ABNORMAL LOW (ref >59)

## 2021-05-25 LAB — IRON,TIBC AND FERRITIN PANEL
Ferritin: 353 ng/mL — ABNORMAL HIGH (ref 15–150)
Iron Saturation: 19 % (ref 15–55)
Iron: 44 ug/dL (ref 27–139)
Total Iron Binding Capacity: 231 ug/dL — ABNORMAL LOW (ref 250–450)
UIBC: 187 ug/dL (ref 118–369)

## 2021-05-25 LAB — LIPID PANEL
Chol/HDL Ratio: 3 ratio (ref 0.0–4.4)
Cholesterol, Total: 115 mg/dL (ref 100–199)
HDL: 38 mg/dL — ABNORMAL LOW (ref >39)
LDL Chol Calc (NIH): 57 mg/dL (ref 0–99)
Triglycerides: 105 mg/dL (ref 0–149)
VLDL Cholesterol Cal: 20 mg/dL (ref 5–40)

## 2021-05-25 LAB — TSH: TSH: 3.58 u[IU]/mL (ref 0.450–4.500)

## 2021-05-25 LAB — URIC ACID: Uric Acid: 5.1 mg/dL (ref 3.1–7.9)

## 2021-05-30 ENCOUNTER — Telehealth: Payer: Self-pay | Admitting: *Deleted

## 2021-05-30 NOTE — Telephone Encounter (Signed)
Patient verified DOB Patients daughter verified results via mychart with no concerns.

## 2021-05-30 NOTE — Telephone Encounter (Signed)
-----   Message from Kennieth Rad, [place] sent at 05/29/2021 10:14 AM EDT ----- Please call patient and let her know that her uric acid levels are well controlled.  Her thyroid function is within normal limits.  She does still show signs of iron deficiency anemia, however stable.  Her cholesterol is well controlled.  Her liver function is within normal limits, her kidney function is slightly decreased.  No medication changes required at this time

## 2021-06-11 ENCOUNTER — Other Ambulatory Visit: Payer: Self-pay | Admitting: Family Medicine

## 2021-06-11 DIAGNOSIS — I152 Hypertension secondary to endocrine disorders: Secondary | ICD-10-CM

## 2021-06-11 NOTE — Telephone Encounter (Signed)
[facility] called and spoke to  Ann,Technician about the refill(s) valsartan hydrochlororthiazide requested. Advised it was sent on 05/22/21 #90/1 refill(s). She says that she showing the 1 refill and unsure why refill request was sent in. Will refuse this request.    Requested Prescriptions  Pending Prescriptions Disp Refills   valsartan-hydrochlorothiazide (DIOVAN-HCT) 320-12.5 MG tablet [Pharmacy Med Name: VALSARTAN-HCTZ 320-12.5 MG TAB] 30 tablet     Sig: TAKE ONE TABLET BY MOUTH DAILY     Cardiovascular: ARB + Diuretic Combos Failed - 06/11/2021  6:41 AM      Failed - Cr in normal range and within 180 days    Creatinine, Ser  Date Value Ref Range Status  05/24/2021 1.07 (H) 0.57 - 1.00 mg/dL Final   Creatinine, Urine  Date Value Ref Range Status  07/25/2020 255.19 mg/dL Final    Comment:    Performed at [facility], [place] [address], [place], [place] [address]          Failed - Last BP in normal range    BP Readings from Last 1 Encounters:  05/22/21 (!) 161/70          Passed - K in normal range and within 180 days    Potassium  Date Value Ref Range Status  05/24/2021 4.7 3.5 - 5.2 mmol/L Final          Passed - Na in normal range and within 180 days    Sodium  Date Value Ref Range Status  05/24/2021 140 134 - 144 mmol/L Final          Passed - Ca in normal range and within 180 days    Calcium  Date Value Ref Range Status  05/24/2021 9.9 8.7 - 10.3 mg/dL Final   Calcium, Ion  Date Value Ref Range Status  05/15/2018 1.19 1.15 - 1.40 mmol/L Final          Passed - Patient is not pregnant      Passed - Valid encounter within last 6 months    Recent Outpatient Visits           2 weeks ago Type 2 diabetes mellitus with diabetic mononeuropathy, without long-term current use of insulin (HCC)   [place] Mayers, Cari S, PA-C   3 months ago Type 2 diabetes mellitus with diabetic mononeuropathy, without  long-term current use of insulin (HCC)   [place], Ney, MD   6 months ago Type 2 diabetes mellitus with diabetic mononeuropathy, without long-term current use of insulin (HCC)   [place], Sunman, MD   9 months ago Type 2 diabetes mellitus with diabetic mononeuropathy, without long-term current use of insulin ([facility])   [place], Enobong, MD   1 year ago Physical exam   [place], [place], NP

## 2021-06-27 ENCOUNTER — Ambulatory Visit (INDEPENDENT_AMBULATORY_CARE_PROVIDER_SITE_OTHER): Payer: Medicare HMO

## 2021-06-27 DIAGNOSIS — I639 Cerebral infarction, unspecified: Secondary | ICD-10-CM | POA: Diagnosis not present

## 2021-06-27 LAB — CUP PACEART REMOTE DEVICE CHECK
Date Time Interrogation Session: 20221128234430
Implantable Pulse Generator Implant Date: 20191022

## 2021-07-06 NOTE — Progress Notes (Signed)
[facility] Summary Report / Loop Recorder 

## 2021-07-27 ENCOUNTER — Encounter: Payer: Self-pay | Admitting: Family Medicine

## 2021-09-02 ENCOUNTER — Emergency Department (HOSPITAL_COMMUNITY): Payer: Medicare HMO

## 2021-09-02 ENCOUNTER — Inpatient Hospital Stay (HOSPITAL_COMMUNITY)
Admission: EM | Admit: 2021-09-02 | Discharge: 2021-09-08 | DRG: 598 | Disposition: A | Discharge status: Home / Self Care | Payer: Medicare HMO | Attending: Internal Medicine | Admitting: Internal Medicine

## 2021-09-02 ENCOUNTER — Encounter (HOSPITAL_COMMUNITY): Payer: Self-pay | Admitting: Emergency Medicine

## 2021-09-02 ENCOUNTER — Other Ambulatory Visit: Payer: Self-pay

## 2021-09-02 DIAGNOSIS — R609 Edema, unspecified: Secondary | ICD-10-CM | POA: Diagnosis not present

## 2021-09-02 DIAGNOSIS — Z4682 Encounter for fitting and adjustment of non-vascular catheter: Secondary | ICD-10-CM | POA: Diagnosis not present

## 2021-09-02 DIAGNOSIS — N179 Acute kidney failure, unspecified: Secondary | ICD-10-CM | POA: Diagnosis present

## 2021-09-02 DIAGNOSIS — E1141 Type 2 diabetes mellitus with diabetic mononeuropathy: Secondary | ICD-10-CM | POA: Diagnosis present

## 2021-09-02 DIAGNOSIS — N6320 Unspecified lump in the left breast, unspecified quadrant: Secondary | ICD-10-CM | POA: Diagnosis not present

## 2021-09-02 DIAGNOSIS — C50912 Malignant neoplasm of unspecified site of left female breast: Secondary | ICD-10-CM | POA: Diagnosis present

## 2021-09-02 DIAGNOSIS — C7951 Secondary malignant neoplasm of bone: Secondary | ICD-10-CM | POA: Diagnosis present

## 2021-09-02 DIAGNOSIS — Z87891 Personal history of nicotine dependence: Secondary | ICD-10-CM

## 2021-09-02 DIAGNOSIS — R0600 Dyspnea, unspecified: Secondary | ICD-10-CM

## 2021-09-02 DIAGNOSIS — I152 Hypertension secondary to endocrine disorders: Secondary | ICD-10-CM | POA: Diagnosis present

## 2021-09-02 DIAGNOSIS — I69351 Hemiplegia and hemiparesis following cerebral infarction affecting right dominant side: Secondary | ICD-10-CM

## 2021-09-02 DIAGNOSIS — Z8616 Personal history of COVID-19: Secondary | ICD-10-CM | POA: Diagnosis not present

## 2021-09-02 DIAGNOSIS — D649 Anemia, unspecified: Secondary | ICD-10-CM | POA: Diagnosis not present

## 2021-09-02 DIAGNOSIS — I421 Obstructive hypertrophic cardiomyopathy: Secondary | ICD-10-CM | POA: Diagnosis present

## 2021-09-02 DIAGNOSIS — D689 Coagulation defect, unspecified: Secondary | ICD-10-CM | POA: Diagnosis present

## 2021-09-02 DIAGNOSIS — Z823 Family history of stroke: Secondary | ICD-10-CM | POA: Diagnosis not present

## 2021-09-02 DIAGNOSIS — R069 Unspecified abnormalities of breathing: Secondary | ICD-10-CM

## 2021-09-02 DIAGNOSIS — I700 Atherosclerosis of aorta: Secondary | ICD-10-CM | POA: Diagnosis not present

## 2021-09-02 DIAGNOSIS — Z9689 Presence of other specified functional implants: Secondary | ICD-10-CM | POA: Diagnosis not present

## 2021-09-02 DIAGNOSIS — M7989 Other specified soft tissue disorders: Secondary | ICD-10-CM | POA: Diagnosis not present

## 2021-09-02 DIAGNOSIS — Z882 Allergy status to sulfonamides status: Secondary | ICD-10-CM | POA: Diagnosis not present

## 2021-09-02 DIAGNOSIS — Z8249 Family history of ischemic heart disease and other diseases of the circulatory system: Secondary | ICD-10-CM | POA: Diagnosis not present

## 2021-09-02 DIAGNOSIS — E785 Hyperlipidemia, unspecified: Secondary | ICD-10-CM | POA: Diagnosis present

## 2021-09-02 DIAGNOSIS — R7989 Other specified abnormal findings of blood chemistry: Secondary | ICD-10-CM

## 2021-09-02 DIAGNOSIS — E1169 Type 2 diabetes mellitus with other specified complication: Secondary | ICD-10-CM | POA: Diagnosis present

## 2021-09-02 DIAGNOSIS — E1159 Type 2 diabetes mellitus with other circulatory complications: Secondary | ICD-10-CM | POA: Diagnosis present

## 2021-09-02 DIAGNOSIS — Z7984 Long term (current) use of oral hypoglycemic drugs: Secondary | ICD-10-CM

## 2021-09-02 DIAGNOSIS — R531 Weakness: Secondary | ICD-10-CM

## 2021-09-02 DIAGNOSIS — R911 Solitary pulmonary nodule: Secondary | ICD-10-CM | POA: Diagnosis not present

## 2021-09-02 DIAGNOSIS — D638 Anemia in other chronic diseases classified elsewhere: Secondary | ICD-10-CM | POA: Diagnosis present

## 2021-09-02 DIAGNOSIS — J90 Pleural effusion, not elsewhere classified: Secondary | ICD-10-CM | POA: Diagnosis not present

## 2021-09-02 DIAGNOSIS — Z79899 Other long term (current) drug therapy: Secondary | ICD-10-CM

## 2021-09-02 DIAGNOSIS — I422 Other hypertrophic cardiomyopathy: Secondary | ICD-10-CM | POA: Diagnosis present

## 2021-09-02 DIAGNOSIS — Z20822 Contact with and (suspected) exposure to covid-19: Secondary | ICD-10-CM | POA: Diagnosis present

## 2021-09-02 DIAGNOSIS — Z8701 Personal history of pneumonia (recurrent): Secondary | ICD-10-CM

## 2021-09-02 DIAGNOSIS — D509 Iron deficiency anemia, unspecified: Secondary | ICD-10-CM | POA: Diagnosis present

## 2021-09-02 DIAGNOSIS — I517 Cardiomegaly: Secondary | ICD-10-CM | POA: Diagnosis not present

## 2021-09-02 DIAGNOSIS — J910 Malignant pleural effusion: Secondary | ICD-10-CM | POA: Diagnosis present

## 2021-09-02 DIAGNOSIS — Z90710 Acquired absence of both cervix and uterus: Secondary | ICD-10-CM | POA: Diagnosis not present

## 2021-09-02 DIAGNOSIS — R918 Other nonspecific abnormal finding of lung field: Secondary | ICD-10-CM | POA: Diagnosis not present

## 2021-09-02 DIAGNOSIS — R4189 Other symptoms and signs involving cognitive functions and awareness: Secondary | ICD-10-CM | POA: Diagnosis present

## 2021-09-02 DIAGNOSIS — M109 Gout, unspecified: Secondary | ICD-10-CM | POA: Diagnosis present

## 2021-09-02 DIAGNOSIS — R0602 Shortness of breath: Secondary | ICD-10-CM | POA: Diagnosis not present

## 2021-09-02 DIAGNOSIS — R599 Enlarged lymph nodes, unspecified: Secondary | ICD-10-CM | POA: Diagnosis not present

## 2021-09-02 DIAGNOSIS — C384 Malignant neoplasm of pleura: Secondary | ICD-10-CM | POA: Diagnosis not present

## 2021-09-02 LAB — CBC
HCT: 30.7 % — ABNORMAL LOW (ref 36.0–46.0)
Hemoglobin: 9.6 g/dL — ABNORMAL LOW (ref 12.0–15.0)
MCH: 22.5 pg — ABNORMAL LOW (ref 26.0–34.0)
MCHC: 31.3 g/dL (ref 30.0–36.0)
MCV: 71.9 fL — ABNORMAL LOW (ref 80.0–100.0)
Platelets: 489 10*3/uL — ABNORMAL HIGH (ref 150–400)
RBC: 4.27 MIL/uL (ref 3.87–5.11)
RDW: 14.7 % (ref 11.5–15.5)
WBC: 8.9 10*3/uL (ref 4.0–10.5)
nRBC: 0 % (ref 0.0–0.2)

## 2021-09-02 LAB — BASIC METABOLIC PANEL
Anion gap: 15 (ref 5–15)
BUN: 70 mg/dL — ABNORMAL HIGH (ref 8–23)
CO2: 18 mmol/L — ABNORMAL LOW (ref 22–32)
Calcium: 9.6 mg/dL (ref 8.9–10.3)
Chloride: 103 mmol/L (ref 98–111)
Creatinine, Ser: 1.61 mg/dL — ABNORMAL HIGH (ref 0.44–1.00)
GFR, Estimated: 33 mL/min — ABNORMAL LOW (ref >60)
Glucose, Bld: 161 mg/dL — ABNORMAL HIGH (ref 70–99)
Potassium: 5.1 mmol/L (ref 3.5–5.1)
Sodium: 136 mmol/L (ref 135–145)

## 2021-09-02 LAB — IRON AND TIBC
Iron: 40 ug/dL (ref 28–170)
Saturation Ratios: 18 % (ref 10.4–31.8)
TIBC: 223 ug/dL — ABNORMAL LOW (ref 250–450)
UIBC: 183 ug/dL

## 2021-09-02 LAB — FERRITIN: Ferritin: 894 ng/mL — ABNORMAL HIGH (ref 11–307)

## 2021-09-02 LAB — RESP PANEL BY RT-PCR (FLU A&B, COVID) ARPGX2
Influenza A by PCR: NEGATIVE
Influenza B by PCR: NEGATIVE
SARS Coronavirus 2 by RT PCR: NEGATIVE

## 2021-09-02 LAB — TSH: TSH: 3.246 u[IU]/mL (ref 0.350–4.500)

## 2021-09-02 LAB — D-DIMER, QUANTITATIVE: D-Dimer, Quant: 6.79 ug/mL-FEU — ABNORMAL HIGH (ref 0.00–0.50)

## 2021-09-02 LAB — BRAIN NATRIURETIC PEPTIDE: B Natriuretic Peptide: 16.8 pg/mL (ref 0.0–100.0)

## 2021-09-02 LAB — GLUCOSE, CAPILLARY: Glucose-Capillary: 117 mg/dL — ABNORMAL HIGH (ref 70–99)

## 2021-09-02 LAB — TROPONIN I (HIGH SENSITIVITY)
Troponin I (High Sensitivity): 8 ng/L (ref <18)
Troponin I (High Sensitivity): 8 ng/L (ref <18)

## 2021-09-02 MED ORDER — ATORVASTATIN CALCIUM 80 MG PO TABS
80.0000 mg | ORAL_TABLET | Freq: Every day | ORAL | Status: DC
  Administered 2021-09-03 – 2021-09-07 (×6): 80 mg via ORAL
  Filled 2021-09-02 (×6): qty 1

## 2021-09-02 MED ORDER — INSULIN ASPART 100 UNIT/ML IJ SOLN
0.0000 [IU] | Freq: Every day | INTRAMUSCULAR | Status: DC
  Administered 2021-09-04: 2 [IU] via SUBCUTANEOUS

## 2021-09-02 MED ORDER — ALBUTEROL SULFATE (2.5 MG/3ML) 0.083% IN NEBU: 2.5000 mg | INHALATION_SOLUTION | RESPIRATORY_TRACT | Status: DC | PRN

## 2021-09-02 MED ORDER — LACTATED RINGERS IV SOLN: INTRAVENOUS | Status: AC

## 2021-09-02 MED ORDER — ONDANSETRON HCL 4 MG/2ML IJ SOLN
4.0000 mg | Freq: Four times a day (QID) | INTRAMUSCULAR | Status: DC | PRN
  Administered 2021-09-02: 4 mg via INTRAVENOUS
  Filled 2021-09-02: qty 2

## 2021-09-02 MED ORDER — CARVEDILOL 25 MG PO TABS
25.0000 mg | ORAL_TABLET | Freq: Two times a day (BID) | ORAL | Status: DC
  Administered 2021-09-03 – 2021-09-08 (×9): 25 mg via ORAL
  Filled 2021-09-02 (×11): qty 1

## 2021-09-02 MED ORDER — HEPARIN SODIUM (PORCINE) 5000 UNIT/ML IJ SOLN
5000.0000 [IU] | Freq: Three times a day (TID) | INTRAMUSCULAR | Status: DC
  Administered 2021-09-03 – 2021-09-07 (×11): 5000 [IU] via SUBCUTANEOUS
  Filled 2021-09-02 (×11): qty 1

## 2021-09-02 MED ORDER — INSULIN ASPART 100 UNIT/ML IJ SOLN
0.0000 [IU] | Freq: Three times a day (TID) | INTRAMUSCULAR | Status: DC
  Administered 2021-09-03: 1 [IU] via SUBCUTANEOUS
  Administered 2021-09-04: 3 [IU] via SUBCUTANEOUS
  Administered 2021-09-04 (×2): 1 [IU] via SUBCUTANEOUS
  Administered 2021-09-05: 7 [IU] via SUBCUTANEOUS
  Administered 2021-09-05: 2 [IU] via SUBCUTANEOUS
  Administered 2021-09-05: 1 [IU] via SUBCUTANEOUS
  Administered 2021-09-06 (×2): 2 [IU] via SUBCUTANEOUS
  Administered 2021-09-06: 5 [IU] via SUBCUTANEOUS
  Administered 2021-09-07 (×2): 2 [IU] via SUBCUTANEOUS
  Administered 2021-09-07: 3 [IU] via SUBCUTANEOUS
  Administered 2021-09-08: 2 [IU] via SUBCUTANEOUS

## 2021-09-02 MED ORDER — ACETAMINOPHEN 650 MG RE SUPP: 650.0000 mg | Freq: Four times a day (QID) | RECTAL | Status: DC | PRN

## 2021-09-02 MED ORDER — ONDANSETRON HCL 4 MG PO TABS: 4.0000 mg | ORAL_TABLET | Freq: Four times a day (QID) | ORAL | Status: DC | PRN

## 2021-09-02 MED ORDER — AMLODIPINE BESYLATE 10 MG PO TABS
10.0000 mg | ORAL_TABLET | Freq: Every day | ORAL | Status: DC
  Administered 2021-09-03: 10 mg via ORAL
  Filled 2021-09-02: qty 1

## 2021-09-02 MED ORDER — ACETAMINOPHEN 325 MG PO TABS: 650.0000 mg | ORAL_TABLET | Freq: Four times a day (QID) | ORAL | Status: DC | PRN

## 2021-09-02 MED ORDER — SODIUM CHLORIDE 0.9 % IV SOLN
1.0000 g | Freq: Once | INTRAVENOUS | Status: AC
  Administered 2021-09-02: 1 g via INTRAVENOUS
  Filled 2021-09-02: qty 10

## 2021-09-02 MED ORDER — SODIUM CHLORIDE 0.9 % IV SOLN
500.0000 mg | Freq: Once | INTRAVENOUS | Status: AC
  Administered 2021-09-02: 500 mg via INTRAVENOUS
  Filled 2021-09-02: qty 5

## 2021-09-02 MED ORDER — GABAPENTIN 100 MG PO CAPS
100.0000 mg | ORAL_CAPSULE | Freq: Three times a day (TID) | ORAL | Status: DC
  Administered 2021-09-03 – 2021-09-08 (×17): 100 mg via ORAL
  Filled 2021-09-02 (×17): qty 1

## 2021-09-02 MED ORDER — MELATONIN 5 MG PO TABS: 10.0000 mg | ORAL_TABLET | Freq: Every evening | ORAL | Status: DC | PRN

## 2021-09-02 NOTE — Assessment & Plan Note (Addendum)
S/p left-sided pigtail chest tube placement by PCCM on 2/5.  Pleural fluid analysis consistent with an exudate-given findings on CT chest-highly suspicion that this is due to stage IV breast cancer.  Cytology has come back positive for metastatic adenocarcinoma.  Since pleural effusion worsened after clamping chest tube-PCCM inserted Pleurx catheter on 2/9.  PCCM will arrange for outpatient follow-up.

## 2021-09-02 NOTE — Assessment & Plan Note (Signed)
dtr reports pt was on Eliquis research study protocol but stopped taking any research meds 3 weeks ago. Pt only takes 81 mg ASA at this point.

## 2021-09-02 NOTE — Progress Notes (Signed)
PCCM brief note  Consulted by EDP for evaluation of left pleural effusion.  Discussed with admitting hospitalist Dr. Bridgett Larsson. Patient on room air, not in distress. Dr. Bridgett Larsson does not feel that pulmonology assistance is needed overnight. Plan per Hospitalist team is to send patient to IR in AM for thora and plan to obtain CT chest with IV contrast after thoracentesis and AKI resolve.   Hospitalist team would like pulmonology follow up s/p IR thoracentesis.   Will sign out to day team.  Redmond [employer]., MSN, APRN, AGACNP-BC [facility]  09/02/2021 , 9:37 PM  Please see [email or web address] for pager details  If no response, please call [phone] After hours, please call Elink at [phone]

## 2021-09-02 NOTE — Assessment & Plan Note (Addendum)
Likely hemodynamically mediated creatinine slowly downtrending-close to baseline.

## 2021-09-02 NOTE — Assessment & Plan Note (Addendum)
Appears to be a chronic issue-iron panel not suggestive of iron deficiency anemia.  Defer further to the outpatient setting.Marland Kitchen

## 2021-09-02 NOTE — H&P (Signed)
History and Physical    Heather Rogers YEM:[id number] DOB: 04/18/1946 DOA: 09/02/2021  DOS: the patient was seen and examined on 09/02/2021  PCP: Charlott Rakes, MD   Patient coming from: Home  I have personally briefly reviewed patient's old medical records in [place]  CC: SOB HPI: 76 year old African-American female with a history of type 2 diabetes, hypertension, history of prior stroke with this right-sided hemiparesis presents to the ER today with 2-week history of dyspnea.  She is also had 1 episode of vomiting yesterday.  She has been having increasing dyspnea on exertion.  Daughter was unaware of all the symptoms until today.  Patient states that she had some nausea 2 weeks ago and was unable to go to church.  She did not tell her daughter that she was also dyspneic.  Her last week, the patient states that she has lost weight.  Daughter does not think the patient has lost weight.  Patient states that she has not been eating last couple days.  Daughter states that the patient was eating beginning of this past week when she was over at the patient's house.  Patient denies any recent fever, chills, diarrhea.  She has had an occasional cough.  She quit smoking 40 years ago.  She denies any abnormally new breast masses.  In the ER, temp 99, heart rate 93, 98% room air sats.  Labs showed a D-dimer of 6.79 Sodium 136, bicarb 18, BUN of 70, creatinine 1.6  White count 8.9, he will 9.6, platelets 49, MCV 71.9.  COVID negative, influenza negative  BMP normal at 16.8.  Chest x-ray reveals complete whiteout of the left hemithorax some mild deviation of the trachea to the right.  I performed a bedside thoracic ultrasound which demonstrated a large left-sided pleural effusion.  [facility] hospitalist contacted for admission.  EDP is contacted pulmonology for thoracentesis tonight.   ED Course: CXR shows complete white-out left hemithorax. Labs show AKI, anemia.  Review of  Systems:  Review of Systems  Constitutional:  Positive for malaise/fatigue and weight loss. Negative for chills and fever.  HENT: Negative.    Eyes: Negative.   Respiratory:  Positive for cough and shortness of breath.   Cardiovascular:  Negative for leg swelling.  Gastrointestinal:  Positive for nausea and vomiting. Negative for abdominal pain, blood in stool, diarrhea and melena.  Genitourinary: Negative.   Musculoskeletal: Negative.   Skin: Negative.   Neurological: Negative.   Endo/Heme/Allergies: Negative.   Psychiatric/Behavioral: Negative.    All other systems reviewed and are negative.  Past Medical History:  Diagnosis Date   Diabetes ([place])    Hypertension    Pneumonia due to COVID-19 virus 07/25/2020   Stroke (cerebrum) [facility])    Stroke ([place]) 05/15/2018    Past Surgical History:  Procedure Laterality Date   EYE SURGERY Left 04/06/2020   Implant placed   EYE SURGERY Right 03/30/2020   Implant placed    hysterectomy     LOOP RECORDER INSERTION N/A 05/20/2018   Procedure: LOOP RECORDER INSERTION;  Surgeon: Thompson Grayer, MD;  Location: [facility];  Service: Cardiovascular;  Laterality: N/A;   TEE WITHOUT CARDIOVERSION N/A 05/20/2018   Procedure: TRANSESOPHAGEAL ECHOCARDIOGRAM (TEE);  Surgeon: Sueanne Margarita, MD;  Location: [facility] ENDOSCOPY;  Service: Cardiovascular;  Laterality: N/A;     reports that she has quit smoking. She has never used smokeless tobacco. She reports that she does not drink alcohol and does not use drugs.  Allergies  Allergen Reactions   Sulfa Antibiotics     Itching     Family History  Problem Relation Age of Onset   Hypertension Mother    Stroke Mother    Hypertension Father     Prior to Admission medications   Medication Sig Start Date End Date Taking? Authorizing Provider  Accu-Chek FastClix Lancets MISC Check blood sugar fasting and before meals and again if pt feels bad (symptoms of hypo). 05/22/21   Mayers, Cari S, PA-C   allopurinol (ZYLOPRIM) 100 MG tablet TAKE ONE TABLET BY MOUTH DAILY 05/10/21   Charlott Rakes, MD  amLODipine (NORVASC) 10 MG tablet Take 1 tablet (10 mg total) by mouth daily. 05/22/21   Mayers, Cari S, PA-C  atorvastatin (LIPITOR) 80 MG tablet TAKE ONE TABLET BY MOUTH EVERY EVENING TO LOWER CHOLESTEROL 05/22/21   Mayers, Cari S, PA-C  Blood Glucose Monitoring Suppl (ACCU-CHEK GUIDE) w/Device KIT 1 each by Does not apply route as directed. 08/25/18   Fulp, Cammie, MD  carvedilol (COREG) 25 MG tablet Take 1 tablet (25 mg total) by mouth 2 (two) times daily with a meal. 05/22/21   Mayers, Cari S, PA-C  cholecalciferol (VITAMIN D3) 25 MCG (1000 UNIT) tablet Take 1,000 Units by mouth daily.    [provider]  ferrous sulfate 325 (65 FE) MG EC tablet Take 1 tablet (325 mg total) by mouth in the morning and at bedtime. 05/22/21   Mayers, Cari S, PA-C  gabapentin (NEURONTIN) 100 MG capsule Take 1 capsule (100 mg total) by mouth 3 (three) times daily. 05/22/21   Mayers, Cari S, PA-C  glucose blood (ACCU-CHEK GUIDE) test strip USE TO CHECK FASTING BLOOD SUGAR AND BEFORE MEALS AND AGAIN IF PATIENT FEELS BAD; SYMPTOMS OF HYPOGLYCEMIA 05/16/21   Dorna Mai, MD  metFORMIN (GLUCOPHAGE) 500 MG tablet TAKE ONE TABLET BY MOUTH TWICE A DAY WITH A MEAL 05/22/21   Mayers, Cari S, PA-C  STUDY - [place] - apixaban 46m or placebo (PI - Sethi) Take 5 mg by mouth 2 (two) times daily.    [provider]  Study - [place] - aspirin 867mor Placebo Take 81 mg by mouth daily.    [provider]  valsartan-hydrochlorothiazide (DIOVAN-HCT) 320-12.5 MG tablet Take 1 tablet by mouth daily. 05/22/21   Mayers, CaLoraine GripPA-C    Physical Exam: Vitals:   09/02/21 1829 09/02/21 1830 09/02/21 1930 09/02/21 2015  BP:  138/70 128/75 129/69  Pulse:  94 92 89  Resp:  (!) 27 (!) 21 (!) 26  Temp: 99 F (37.2 C)     TempSrc: Oral     SpO2:  92% 100% 96%  Weight:      Height:        Physical  Exam Vitals and nursing note reviewed.  Constitutional:      General: She is not in acute distress.    Appearance: Normal appearance. She is normal weight. She is not ill-appearing, toxic-appearing or diaphoretic.  HENT:     Head: Normocephalic and atraumatic.     Nose: Nose normal.  Eyes:     General: No scleral icterus. Cardiovascular:     Rate and Rhythm: Normal rate and regular rhythm.  Pulmonary:     Comments: No BS left side Bedside thoracic U/S shows large left sided pleural effusion Abdominal:     General: Abdomen is flat. Bowel sounds are normal. There is no distension.     Palpations: Abdomen is soft.     Tenderness:  There is no abdominal tenderness. There is no guarding or rebound.  Musculoskeletal:     Right lower leg: No edema.     Left lower leg: No edema.  Skin:    General: Skin is warm and dry.     Capillary Refill: Capillary refill takes less than 2 seconds.  Neurological:     Mental Status: She is alert and oriented to person, place, and time.     Labs on Admission: I have personally reviewed following labs and imaging studies  CBC: Recent Labs  Lab 09/02/21 1840  WBC 8.9  HGB 9.6*  HCT 30.7*  MCV 71.9*  PLT 878*   Basic Metabolic Panel: Recent Labs  Lab 09/02/21 1840  NA 136  K 5.1  CL 103  CO2 18*  GLUCOSE 161*  BUN 70*  CREATININE 1.61*  CALCIUM 9.6   GFR: Estimated Creatinine Clearance: 27.3 mL/min (A) (by C-G formula based on SCr of 1.61 mg/dL (H)). Liver Function Tests: No results for input(s): AST, ALT, ALKPHOS, BILITOT, PROT, ALBUMIN in the last 168 hours. No results for input(s): LIPASE, AMYLASE in the last 168 hours. No results for input(s): AMMONIA in the last 168 hours. Coagulation Profile: No results for input(s): INR, PROTIME in the last 168 hours. Cardiac Enzymes: No results for input(s): CKTOTAL, CKMB, CKMBINDEX, TROPONINI in the last 168 hours. BNP (last 3 results) No results for input(s): PROBNP in the last 8760  hours. HbA1C: No results for input(s): HGBA1C in the last 72 hours. CBG: No results for input(s): GLUCAP in the last 168 hours. Lipid Profile: No results for input(s): CHOL, HDL, LDLCALC, TRIG, CHOLHDL, LDLDIRECT in the last 72 hours. Thyroid Function Tests: No results for input(s): TSH, T4TOTAL, FREET4, T3FREE, THYROIDAB in the last 72 hours. Anemia Panel: No results for input(s): VITAMINB12, FOLATE, FERRITIN, TIBC, IRON, RETICCTPCT in the last 72 hours. Urine analysis:    Component Value Date/Time   COLORURINE YELLOW 07/25/2020 1057   APPEARANCEUR HAZY (A) 07/25/2020 1057   LABSPEC 1.017 07/25/2020 1057   PHURINE 5.0 07/25/2020 1057   GLUCOSEU NEGATIVE 07/25/2020 1057   HGBUR SMALL (A) 07/25/2020 1057   BILIRUBINUR NEGATIVE 07/25/2020 1057   KETONESUR 5 (A) 07/25/2020 1057   PROTEINUR 30 (A) 07/25/2020 1057   UROBILINOGEN 0.2 12/20/2009 1049   NITRITE NEGATIVE 07/25/2020 1057   LEUKOCYTESUR LARGE (A) 07/25/2020 1057    Radiological Exams on Admission: I have personally reviewed images DG Chest 2 View  Result Date: 09/02/2021 CLINICAL DATA:  Shortness of breath EXAM: CHEST - 2 VIEW COMPARISON:  07/25/2020 FINDINGS: Complete whiteout of the left hemithorax with no aeration demonstrated in the left lung. Changes could represent either or combination of pleural effusion, atelectasis, and/or consolidation. The right lung is clear. Heart size is obscured by the left chest process. Degenerative changes in the spine and shoulders. A loop recorder is present. IMPRESSION: Complete whiteout of the left lung suggesting pleural effusion, atelectasis, and/or consolidation. Electronically Signed   By: Lucienne Capers M.D.   On: 09/02/2021 19:26      EKG: I have personally reviewed EKG: NSR    Assessment/Plan Principal Problem:   Pleural effusion on left Active Problems:   AKI (acute kidney injury) (HCC)   Spastic hemiplegia of right dominant side as late effect of cerebral infarction  ([place])   Type 2 diabetes mellitus with diabetic mononeuropathy, without long-term current use of insulin (HCC)   Coagulopathy (HCC)   Anemia    Assessment and Plan: * Pleural  effusion on left- (present on admission) Admit to med/surg bed. EDP has contacted pulmonology for thoracentesis. If unable to get thoracentesis tonight, will go ahead and order for IR thoracentesis tomorrow. If she gets thora by pulmonology tonight, please cancel IR thoracentesis. Discussed that source of her pleural effusion is unknown at this time but possibility of pleural effusion coming from undiagnosed lung cancer discussed with pt and her dtr Heather Rogers.  Recommend patient undergo CT chest with IV contrast after thoracentesis and AKI resolved to rule out primary lung neoplasm.  AKI (acute kidney injury) ([place]) Hold HCTZ/ACEI. Start  IVF. Repeat BMP in AM.  Anemia- (present on admission) Stable. Slightly worse compared to 3 months ago. Pt and dtr both deny pt having any melena, bloody stools or other external sources of bleeding. Check Fe, TIBC.  Coagulopathy ([place])- (present on admission) dtr reports pt was on Eliquis research study protocol but stopped taking any research meds 3 weeks ago. Pt only takes 81 mg ASA at this point.  Type 2 diabetes mellitus with diabetic mononeuropathy, without long-term current use of insulin ([place])- (present on admission) Check A1c. Start SSI.  Spastic hemiplegia of right dominant side as late effect of cerebral infarction (HCC) Chronic.    DVT prophylaxis: SCDs Code Status: Full Code Family Communication: discussed with pt and dtr Heather Rogers  Disposition Plan: return home  Consults called: pulmonology by EDP  Admission status: Observation, Med-Surg   Kristopher Oppenheim, DO [facility] 09/02/2021, 9:09 PM

## 2021-09-02 NOTE — ED Provider Notes (Addendum)
[facility] EMERGENCY DEPARTMENT Provider Note   CSN: [id number] Arrival date & time: 09/02/21  1811     History  Chief Complaint  Patient presents with   Weakness    Heather Rogers is a 76 y.o. female.   Weakness Associated symptoms: shortness of breath    76 year old female with a history of HTN, DM 2, prior CVA with right-sided weakness who presents to the emergency department with a history of shortness of breath.  Family were concerned for possible recurrence of stroke as the patient had been lying primarily on her left side and favoring it.  She has had increasing weakness and fatigue over the past few days.  She has been able to participate in any activities of daily living and has been leaving her dishes unwashed due to increasing fatigue.  She endorses shortness of breath.  She has had roughly 2 to 3-week history of shortness of breath and fatigue.  No weight loss.  No chest pain.  No fever, chills or cough.  Home Medications Prior to Admission medications   Medication Sig Start Date End Date Taking? Authorizing Provider  Accu-Chek FastClix Lancets MISC Check blood sugar fasting and before meals and again if pt feels bad (symptoms of hypo). 05/22/21  Yes Mayers, Cari S, PA-C  allopurinol (ZYLOPRIM) 100 MG tablet TAKE ONE TABLET BY MOUTH DAILY Patient taking differently: Take 100 mg by mouth daily. 05/10/21  Yes Charlott Rakes, MD  amLODipine (NORVASC) 10 MG tablet Take 1 tablet (10 mg total) by mouth daily. 05/22/21  Yes Mayers, Cari S, PA-C  aspirin EC 81 MG tablet Take 81 mg by mouth daily. Swallow whole.   Yes [provider]  atorvastatin (LIPITOR) 80 MG tablet TAKE ONE TABLET BY MOUTH EVERY EVENING TO LOWER CHOLESTEROL Patient taking differently: Take 80 mg by mouth every evening. 05/22/21  Yes Mayers, Cari S, PA-C  Blood Glucose Monitoring Suppl (ACCU-CHEK GUIDE) w/Device KIT 1 each by Does not apply route as directed. 08/25/18  Yes Fulp,  Cammie, MD  carvedilol (COREG) 25 MG tablet Take 1 tablet (25 mg total) by mouth 2 (two) times daily with a meal. 05/22/21  Yes Mayers, Cari S, PA-C  cholecalciferol (VITAMIN D3) 25 MCG (1000 UNIT) tablet Take 1,000 Units by mouth daily.   Yes [provider]  ferrous sulfate 325 (65 FE) MG EC tablet Take 1 tablet (325 mg total) by mouth in the morning and at bedtime. 05/22/21  Yes Mayers, Cari S, PA-C  gabapentin (NEURONTIN) 100 MG capsule Take 1 capsule (100 mg total) by mouth 3 (three) times daily. 05/22/21  Yes Mayers, Cari S, PA-C  glucose blood (ACCU-CHEK GUIDE) test strip USE TO CHECK FASTING BLOOD SUGAR AND BEFORE MEALS AND AGAIN IF PATIENT FEELS BAD; SYMPTOMS OF HYPOGLYCEMIA 05/16/21  Yes Dorna Mai, MD  Metamucil Fiber CHEW Chew 1 tablet by mouth daily.   Yes [provider]  metFORMIN (GLUCOPHAGE) 500 MG tablet TAKE ONE TABLET BY MOUTH TWICE A DAY WITH A MEAL Patient taking differently: Take 500 mg by mouth 2 (two) times daily with a meal. 05/22/21  Yes Mayers, Cari S, PA-C  valsartan-hydrochlorothiazide (DIOVAN-HCT) 320-12.5 MG tablet Take 1 tablet by mouth daily. 05/22/21  Yes Mayers, Cari S, PA-C  STUDY - [place] - apixaban 44m or placebo (PI - Sethi) Take 5 mg by mouth 2 (two) times daily. Patient not taking: Reported on 09/02/2021    [provider]  Study - [place] - aspirin 819m  or Placebo Take 81 mg by mouth daily. Patient not taking: Reported on 09/02/2021    [provider]      Allergies    Sulfa antibiotics    Review of Systems   Review of Systems  Constitutional:  Positive for fatigue.  Respiratory:  Positive for shortness of breath.   Neurological:  Positive for weakness.  All other systems reviewed and are negative.  Physical Exam Updated Vital Signs BP 129/69    Pulse 89    Temp 99 F (37.2 C) (Oral)    Resp (!) 26    Ht _0  (1.575 m)    Wt 68 kg    SpO2 96%    BMI 27.44 kg/m  Physical Exam  ED Results / Procedures /  Treatments   Labs (all labs ordered are listed, but only abnormal results are displayed) Labs Reviewed  BASIC METABOLIC PANEL - Abnormal; Notable for the following components:      Result Value   CO2 18 (*)    Glucose, Bld 161 (*)    BUN 70 (*)    Creatinine, Ser 1.61 (*)    GFR, Estimated 33 (*)    All other components within normal limits  CBC - Abnormal; Notable for the following components:   Hemoglobin 9.6 (*)    HCT 30.7 (*)    MCV 71.9 (*)    MCH 22.5 (*)    Platelets 489 (*)    All other components within normal limits  D-DIMER, QUANTITATIVE - Abnormal; Notable for the following components:   D-Dimer, Quant 6.79 (*)    All other components within normal limits  RESP PANEL BY RT-PCR (FLU A&B, COVID) ARPGX2  BRAIN NATRIURETIC PEPTIDE  TSH  TROPONIN I (HIGH SENSITIVITY)  TROPONIN I (HIGH SENSITIVITY)    EKG EKG Interpretation  Date/Time:  Saturday September 02 2021 18:28:05 EST Ventricular Rate:  92 PR Interval:  183 QRS Duration: 82 QT Interval:  344 QTC Calculation: 426 R Axis:   35 Text Interpretation: Sinus rhythm Low voltage, precordial leads Abnormal R-wave progression, early transition Baseline wander in lead(s) V4 V6 Confirmed by Regan Lemming ([id number]) on 09/02/2021 9:28:25 PM  Radiology DG Chest 2 View  Result Date: 09/02/2021 CLINICAL DATA:  Shortness of breath EXAM: CHEST - 2 VIEW COMPARISON:  07/25/2020 FINDINGS: Complete whiteout of the left hemithorax with no aeration demonstrated in the left lung. Changes could represent either or combination of pleural effusion, atelectasis, and/or consolidation. The right lung is clear. Heart size is obscured by the left chest process. Degenerative changes in the spine and shoulders. A loop recorder is present. IMPRESSION: Complete whiteout of the left lung suggesting pleural effusion, atelectasis, and/or consolidation. Electronically Signed   By: Lucienne Capers M.D.   On: 09/02/2021 19:26    Procedures Procedures     Medications Ordered in ED Medications  azithromycin (ZITHROMAX) 500 mg in sodium chloride 0.9 % 250 mL IVPB (has no administration in time range)  cefTRIAXone (ROCEPHIN) 1 g in sodium chloride 0.9 % 100 mL IVPB (1 g Intravenous New Bag/Given 09/02/21 2101)    ED Course/ Medical Decision Making/ A&P Clinical Course as of 09/02/21 2131  Sat Sep 02, 2021  2016 D-Dimer, Quant(!): 6.79 [JL]  2017 BUN(!): 70 [JL]  2017 Creatinine(!): 1.61 [JL]    Clinical Course User Index [JL] Regan Lemming, MD  Medical Decision Making Amount and/or Complexity of Data Reviewed Labs: ordered. Decision-making details documented in ED Course. Radiology: ordered.  Risk Decision regarding hospitalization.   76 year old female with a history of HTN, DM 2, prior CVA with right-sided weakness who presents to the emergency department with a history of shortness of breath.  Family were concerned for possible recurrence of stroke as the patient had been lying primarily on her left side and favoring it.  She has had increasing weakness and fatigue over the past few days.  She has been able to participate in any activities of daily living and has been leaving her dishes unwashed due to increasing fatigue.  She endorses shortness of breath.  She has had roughly 2 to 3-week history of shortness of breath and fatigue.  No weight loss.  No chest pain.  No fever, chills or cough.  On arrival, the patient was afebrile, borderline elevated temperature to 99, mildly tachypneic RR 22, normotensive BP 138/70 on my check, saturating 98% on room air.  The patient did not have an oxygen requirement.  She presents with worsening dyspnea.  Her physical exam was concerning for complete absence of breath sounds on the left.  Concern for large pleural effusion, pneumothorax, mucous plugging resulting in atelectasis, immune acquired pneumonia with empyema, PE resulting in atelectasis. The patient was covered for  CAP with Rocephin and azithromycin on arrival.  The patient's EKG was performed and revealed sinus rhythm, ventricular rate 92, no abnormal intervals, abnormal R wave progression, no acute ST segment changes, some artifact noted.  Chest x-ray was performed and revealed a complete whiteout of the left lung consistent with the patient's physical exam of no breath sounds on the left.  No evidence of pneumothorax.  Concern for large pleural effusion.  Unclear if transudative or exudative.  I did consult pulmonology to discuss eventual thoracentesis for both diagnostic and therapeutic purposes.  Unclear etiology of the patient's pleural effusion.  Malignancy remains on the differential.  The patient additionally is in a study for cryptogenic stroke in which she took medications (Eliquis versus placebo) although stopped taking the medication 3 weeks ago.  Her laboratory work-up was significant for COVID-19 and influenza PCR negative, CBC without a leukocytosis, anemia to 9.6, mildly stable to decreased from measurements in the tens previously.  BMP revealed an AKI with a creatinine of 1.61 and a BUN of 70, GFR of 33.  D-dimer was elevated to 6.79.  Patient is not tachycardic, denies any chest pain, is hemodynamically stable, saturating well on room air.  Given the patient's low GFR in the setting of AKI, will defer CTA imaging to rule out PE as I do not think that this is likely the etiology of the patient's acute presentation.  After discussion with pulmonology, will plan for CT chest with contrast status post thoracentesis and once AKI resolves to evaluate for possible underlying lung mass/malignancy.  BNP and troponin were normal with a repeat troponin pending.  A TSH was pending.   Patient remained hemodynamically stable, not particularly dyspneic on room air, saturating well on room air while resting comfortably in bed in no respiratory distress.  I discussed the patient with Dr. Bridgett Larsson of hospitalist medicine  who accepted the patient in admission.  The patient was subsequently admitted in stable condition.     Final Clinical Impression(s) / ED Diagnoses Final diagnoses:  Weakness  AKI (acute kidney injury) ([place])  Pleural effusion  Dyspnea, unspecified type    Rx / DC  Orders ED Discharge Orders     None         Regan Lemming, MD 09/02/21 2131    Regan Lemming, MD 09/02/21 2132    Regan Lemming, MD 09/02/21 2134

## 2021-09-02 NOTE — Consult Note (Incomplete)
NAME:  Heather Rogers, MRN:  [id number], DOB:  11-10-1945, LOS: 0 ADMISSION DATE:  09/02/2021, CONSULTATION DATE:  2/4 REFERRING MD:  Armandina Gemma, CHIEF COMPLAINT:  SOB   History of Present Illness:   Heather Rogers, is a 76 y.o. female, who presented to the [facility] ED with a chief complaint of SOB and wheezing  They have a pertinent past medical history of DMII, HTN, HLD stroke  She reports not feeling well and laying in bed over the past few days with generalized weakness. She was brought to the [facility] ED by her daughter and grandson due to concern for a stroke.  ED course was notable for CXR with complete whiteout of the left lung concerning for pleural effusion, atelectasis, and/or consolidation. HS trop 8 BNP 16.8, D dimer 6.79, OVID, FLU neg, WBC 8.9. 37.2 C in ED  PCCM was consulted for evaluation of a left pleural effusion  Pertinent  Medical History  DMII, HTN, stroke  Significant Hospital Events: Including procedures, antibiotic start and stop dates in addition to other pertinent events   2/4 presented to [facility]  Interim History / Subjective:  See above  Subjective: ***  Objective   Blood pressure 129/69, pulse 89, temperature 99 F (37.2 C), temperature source Oral, resp. rate (!) 26, height 5' 2"  (1.575 m), weight 68 kg, SpO2 96 %.       No intake or output data in the 24 hours ending 09/02/21 2057 Filed Weights   09/02/21 1827  Weight: 68 kg    Examination: ***  Labs: Creat 1.61, (baseline 1), BUN 70 HCT 30, MCV 71.9 PLT 489 COVID, FLU neg, WBC 8.9 HS trop 8 BNP 16.8 D dimer 6.79   Resolved Hospital Problem list   ***  Assessment & Plan:  pleural effusion on L SOB, suspect secondary to above. COVID, FLU neg, WBC 8.9. Temp 37.2 in ED. HS trop 8. BNP 16.8. 12 lead with no st changes noted. Generalized fatigue. Unclear etiology. CXR with complete whiteout of the left lung concerning for pleural effusion, atelectasis, and/or consolidation. ***   All  other issues per primary Elevated D dimer AKI Suspect chronic, microcytic anemia Thrombocytosis DM2 HX HTN HX HLD HX CVA     Best Practice (right click and "Reselect all SmartList Selections" daily)   Diet/type: {diet type:25684} DVT prophylaxis: {anticoagulation (Optional):25687} GI prophylaxis: {JG:81157} Lines: {Central Venous Access:25771} Foley:  {Central Venous Access:25691} Code Status:  {Code Status:26939} Last date of multidisciplinary goals of care discussion [***]  Labs   CBC: Recent Labs  Lab 09/02/21 1840  WBC 8.9  HGB 9.6*  HCT 30.7*  MCV 71.9*  PLT 489*    Basic Metabolic Panel: Recent Labs  Lab 09/02/21 1840  NA 136  K 5.1  CL 103  CO2 18*  GLUCOSE 161*  BUN 70*  CREATININE 1.61*  CALCIUM 9.6   GFR: Estimated Creatinine Clearance: 27.3 mL/min (A) (by C-G formula based on SCr of 1.61 mg/dL (H)). Recent Labs  Lab 09/02/21 1840  WBC 8.9    Liver Function Tests: No results for input(s): AST, ALT, ALKPHOS, BILITOT, PROT, ALBUMIN in the last 168 hours. No results for input(s): LIPASE, AMYLASE in the last 168 hours. No results for input(s): AMMONIA in the last 168 hours.  ABG    Component Value Date/Time   TCO2 20 (L) 05/15/2018 0856     Coagulation Profile: No results for input(s): INR, PROTIME in the last 168 hours.  Cardiac Enzymes: No results for  input(s): CKTOTAL, CKMB, CKMBINDEX, TROPONINI in the last 168 hours.  HbA1C: Hemoglobin A1C  Date/Time Value Ref Range Status  05/22/2021 11:54 AM 6.2 (A) 4.0 - 5.6 % Final  04/11/2020 11:55 AM 6.4 (A) 4.0 - 5.6 % Final   HbA1c, POC (controlled diabetic range)  Date/Time Value Ref Range Status  11/17/2020 11:18 AM 6.7 0.0 - 7.0 % Final  08/28/2019 03:57 PM 6.5 0.0 - 7.0 % Final   Hgb A1c MFr Bld  Date/Time Value Ref Range Status  07/25/2020 08:06 AM 6.9 (H) 4.8 - 5.6 % Final    Comment:    (NOTE) Pre diabetes:          5.7%-6.4%  Diabetes:              >6.4%  Glycemic  control for   <7.0% adults with diabetes     CBG: No results for input(s): GLUCAP in the last 168 hours.  Review of Systems:   ***  Past Medical History:  She,  has a past medical history of Diabetes ([place]), Hypertension, and Stroke (cerebrum) ([place]).   Surgical History:   Past Surgical History:  Procedure Laterality Date   EYE SURGERY Left 04/06/2020   Implant placed   EYE SURGERY Right 03/30/2020   Implant placed    hysterectomy     LOOP RECORDER INSERTION N/A 05/20/2018   Procedure: LOOP RECORDER INSERTION;  Surgeon: Thompson Grayer, MD;  Location: [place] [facility];  Service: Cardiovascular;  Laterality: N/A;   TEE WITHOUT CARDIOVERSION N/A 05/20/2018   Procedure: TRANSESOPHAGEAL ECHOCARDIOGRAM (TEE);  Surgeon: Sueanne Margarita, MD;  Location: [facility] ENDOSCOPY;  Service: Cardiovascular;  Laterality: N/A;     Social History:   reports that she has quit smoking. She has never used smokeless tobacco. She reports that she does not drink alcohol and does not use drugs.   Family History:  Her family history includes Hypertension in her father and mother; Stroke in her mother.   Allergies Allergies  Allergen Reactions   Sulfa Antibiotics     Itching      Home Medications  Prior to Admission medications   Medication Sig Start Date End Date Taking? Authorizing Provider  Accu-Chek FastClix Lancets MISC Check blood sugar fasting and before meals and again if pt feels bad (symptoms of hypo). 05/22/21   Mayers, Cari S, PA-C  allopurinol (ZYLOPRIM) 100 MG tablet TAKE ONE TABLET BY MOUTH DAILY 05/10/21   Charlott Rakes, MD  amLODipine (NORVASC) 10 MG tablet Take 1 tablet (10 mg total) by mouth daily. 05/22/21   Mayers, Cari S, PA-C  atorvastatin (LIPITOR) 80 MG tablet TAKE ONE TABLET BY MOUTH EVERY EVENING TO LOWER CHOLESTEROL 05/22/21   Mayers, Cari S, PA-C  Blood Glucose Monitoring Suppl (ACCU-CHEK GUIDE) w/Device KIT 1 each by Does not apply route as directed. 08/25/18   Fulp,  Cammie, MD  carvedilol (COREG) 25 MG tablet Take 1 tablet (25 mg total) by mouth 2 (two) times daily with a meal. 05/22/21   Mayers, Cari S, PA-C  cholecalciferol (VITAMIN D3) 25 MCG (1000 UNIT) tablet Take 1,000 Units by mouth daily.    [provider]  ferrous sulfate 325 (65 FE) MG EC tablet Take 1 tablet (325 mg total) by mouth in the morning and at bedtime. 05/22/21   Mayers, Cari S, PA-C  gabapentin (NEURONTIN) 100 MG capsule Take 1 capsule (100 mg total) by mouth 3 (three) times daily. 05/22/21   Mayers, Cari S, PA-C  glucose blood (ACCU-CHEK  GUIDE) test strip USE TO CHECK FASTING BLOOD SUGAR AND BEFORE MEALS AND AGAIN IF PATIENT FEELS BAD; SYMPTOMS OF HYPOGLYCEMIA 05/16/21   Dorna Mai, MD  metFORMIN (GLUCOPHAGE) 500 MG tablet TAKE ONE TABLET BY MOUTH TWICE A DAY WITH A MEAL 05/22/21   Mayers, Cari S, PA-C  STUDY - [place] - apixaban 29m or placebo (PI - Sethi) Take 5 mg by mouth 2 (two) times daily.    [provider]  Study - [place] - aspirin 862mor Placebo Take 81 mg by mouth daily.    [provider]  valsartan-hydrochlorothiazide (DIOVAN-HCT) 320-12.5 MG tablet Take 1 tablet by mouth daily. 05/22/21   Mayers, CaLoraine GripPA-C     Critical care time: n/a    [employer] MSN, APRN, AGACNP-BC [facility]  09/02/2021 , 8:58 PM  Please see [email or web address] for pager details  If no response, please call [phone] After hours, please call Elink at [phone]

## 2021-09-02 NOTE — ED Notes (Signed)
Dr. Armandina Gemma at bedside assessing pt at this time.

## 2021-09-02 NOTE — Progress Notes (Signed)
Pt admitted to unit; skin check completed with second RN. Pt VS taken, oriented to room, IV patent, released orders, gave patient an idea of the plan. Call bell explained to patient and bed alarm turned on.

## 2021-09-02 NOTE — ED Notes (Signed)
Attempted PIV without success, but able to obtain bloodwork. Extra tubes (blue top and gold top) sent to lab.

## 2021-09-02 NOTE — Assessment & Plan Note (Addendum)
Appears to move all 4 extremities symmetrically-on aspirin/statin.

## 2021-09-02 NOTE — ED Triage Notes (Signed)
Patient brought in by daughter and grandson concerned pt has had a stroke. State when they saw pt today noticed she was leaning to the left. Patient has been laying in the bed not feeling well the past few days and reports feeling weak all over. Pts daughter states pt is leaning to left due to shortness of breath and wheezing. Patient has equal grip strength and equal bilateral sensation. Difficult to tell if pt has left sided drift since pt keeps laying on side when attempting to check.

## 2021-09-02 NOTE — Assessment & Plan Note (Addendum)
CBG stable with SSI.  Resume metformin on discharge.   Recent Labs    09/06/21 1655 09/06/21 2047 09/07/21 0742  GLUCAP 257* 189* 171*

## 2021-09-02 NOTE — Subjective & Objective (Signed)
CC: SOB HPI: 76 year old African-American female with a history of type 2 diabetes, hypertension, history of prior stroke with this right-sided hemiparesis presents to the ER today with 2-week history of dyspnea.  She is also had 1 episode of vomiting yesterday.  She has been having increasing dyspnea on exertion.  Daughter was unaware of all the symptoms until today.  Patient states that she had some nausea 2 weeks ago and was unable to go to church.  She did not tell her daughter that she was also dyspneic.  Her last week, the patient states that she has lost weight.  Daughter does not think the patient has lost weight.  Patient states that she has not been eating last couple days.  Daughter states that the patient was eating beginning of this past week when she was over at the patient's house.  Patient denies any recent fever, chills, diarrhea.  She has had an occasional cough.  She quit smoking 40 years ago.  She denies any abnormally new breast masses.  In the ER, temp 99, heart rate 93, 98% room air sats.  Labs showed a D-dimer of 6.79 Sodium 136, bicarb 18, BUN of 70, creatinine 1.6  White count 8.9, he will 9.6, platelets 49, MCV 71.9.  COVID negative, influenza negative  BMP normal at 16.8.  Chest x-ray reveals complete whiteout of the left hemithorax some mild deviation of the trachea to the right.  I performed a bedside thoracic ultrasound which demonstrated a large left-sided pleural effusion.  [facility] hospitalist contacted for admission.  EDP is contacted pulmonology for thoracentesis tonight.

## 2021-09-03 ENCOUNTER — Observation Stay (HOSPITAL_COMMUNITY): Payer: Medicare HMO

## 2021-09-03 ENCOUNTER — Inpatient Hospital Stay (HOSPITAL_COMMUNITY): Payer: Medicare HMO

## 2021-09-03 DIAGNOSIS — R0602 Shortness of breath: Secondary | ICD-10-CM

## 2021-09-03 DIAGNOSIS — Z87891 Personal history of nicotine dependence: Secondary | ICD-10-CM | POA: Diagnosis not present

## 2021-09-03 DIAGNOSIS — M109 Gout, unspecified: Secondary | ICD-10-CM | POA: Diagnosis present

## 2021-09-03 DIAGNOSIS — I421 Obstructive hypertrophic cardiomyopathy: Secondary | ICD-10-CM | POA: Diagnosis present

## 2021-09-03 DIAGNOSIS — J90 Pleural effusion, not elsewhere classified: Secondary | ICD-10-CM

## 2021-09-03 DIAGNOSIS — R7989 Other specified abnormal findings of blood chemistry: Secondary | ICD-10-CM | POA: Diagnosis not present

## 2021-09-03 DIAGNOSIS — E785 Hyperlipidemia, unspecified: Secondary | ICD-10-CM | POA: Diagnosis present

## 2021-09-03 DIAGNOSIS — E1169 Type 2 diabetes mellitus with other specified complication: Secondary | ICD-10-CM | POA: Diagnosis present

## 2021-09-03 DIAGNOSIS — Z8249 Family history of ischemic heart disease and other diseases of the circulatory system: Secondary | ICD-10-CM | POA: Diagnosis not present

## 2021-09-03 DIAGNOSIS — D509 Iron deficiency anemia, unspecified: Secondary | ICD-10-CM | POA: Diagnosis present

## 2021-09-03 DIAGNOSIS — Z7984 Long term (current) use of oral hypoglycemic drugs: Secondary | ICD-10-CM | POA: Diagnosis not present

## 2021-09-03 DIAGNOSIS — R911 Solitary pulmonary nodule: Secondary | ICD-10-CM | POA: Diagnosis not present

## 2021-09-03 DIAGNOSIS — I152 Hypertension secondary to endocrine disorders: Secondary | ICD-10-CM | POA: Diagnosis present

## 2021-09-03 DIAGNOSIS — I517 Cardiomegaly: Secondary | ICD-10-CM | POA: Diagnosis not present

## 2021-09-03 DIAGNOSIS — Z20822 Contact with and (suspected) exposure to covid-19: Secondary | ICD-10-CM | POA: Diagnosis present

## 2021-09-03 DIAGNOSIS — R918 Other nonspecific abnormal finding of lung field: Secondary | ICD-10-CM | POA: Diagnosis not present

## 2021-09-03 DIAGNOSIS — C50912 Malignant neoplasm of unspecified site of left female breast: Secondary | ICD-10-CM | POA: Diagnosis present

## 2021-09-03 DIAGNOSIS — R531 Weakness: Secondary | ICD-10-CM | POA: Diagnosis present

## 2021-09-03 DIAGNOSIS — I700 Atherosclerosis of aorta: Secondary | ICD-10-CM | POA: Diagnosis not present

## 2021-09-03 DIAGNOSIS — J910 Malignant pleural effusion: Secondary | ICD-10-CM | POA: Diagnosis present

## 2021-09-03 DIAGNOSIS — Z90710 Acquired absence of both cervix and uterus: Secondary | ICD-10-CM | POA: Diagnosis not present

## 2021-09-03 DIAGNOSIS — E1141 Type 2 diabetes mellitus with diabetic mononeuropathy: Secondary | ICD-10-CM | POA: Diagnosis present

## 2021-09-03 DIAGNOSIS — D689 Coagulation defect, unspecified: Secondary | ICD-10-CM | POA: Diagnosis present

## 2021-09-03 DIAGNOSIS — Z9689 Presence of other specified functional implants: Secondary | ICD-10-CM | POA: Diagnosis not present

## 2021-09-03 DIAGNOSIS — R599 Enlarged lymph nodes, unspecified: Secondary | ICD-10-CM | POA: Diagnosis not present

## 2021-09-03 DIAGNOSIS — Z823 Family history of stroke: Secondary | ICD-10-CM | POA: Diagnosis not present

## 2021-09-03 DIAGNOSIS — Z79899 Other long term (current) drug therapy: Secondary | ICD-10-CM | POA: Diagnosis not present

## 2021-09-03 DIAGNOSIS — R609 Edema, unspecified: Secondary | ICD-10-CM

## 2021-09-03 DIAGNOSIS — M7989 Other specified soft tissue disorders: Secondary | ICD-10-CM | POA: Diagnosis not present

## 2021-09-03 DIAGNOSIS — N6320 Unspecified lump in the left breast, unspecified quadrant: Secondary | ICD-10-CM | POA: Diagnosis not present

## 2021-09-03 DIAGNOSIS — Z882 Allergy status to sulfonamides status: Secondary | ICD-10-CM | POA: Diagnosis not present

## 2021-09-03 DIAGNOSIS — Z8616 Personal history of COVID-19: Secondary | ICD-10-CM | POA: Diagnosis not present

## 2021-09-03 DIAGNOSIS — Z4682 Encounter for fitting and adjustment of non-vascular catheter: Secondary | ICD-10-CM | POA: Diagnosis not present

## 2021-09-03 DIAGNOSIS — D649 Anemia, unspecified: Secondary | ICD-10-CM | POA: Diagnosis not present

## 2021-09-03 DIAGNOSIS — N179 Acute kidney failure, unspecified: Secondary | ICD-10-CM | POA: Diagnosis present

## 2021-09-03 DIAGNOSIS — I69351 Hemiplegia and hemiparesis following cerebral infarction affecting right dominant side: Secondary | ICD-10-CM | POA: Diagnosis not present

## 2021-09-03 DIAGNOSIS — Z8701 Personal history of pneumonia (recurrent): Secondary | ICD-10-CM | POA: Diagnosis not present

## 2021-09-03 DIAGNOSIS — C7951 Secondary malignant neoplasm of bone: Secondary | ICD-10-CM | POA: Diagnosis present

## 2021-09-03 LAB — LACTATE DEHYDROGENASE, PLEURAL OR PERITONEAL FLUID: LD, Fluid: 270 U/L — ABNORMAL HIGH (ref 3–23)

## 2021-09-03 LAB — BASIC METABOLIC PANEL
Anion gap: 10 (ref 5–15)
BUN: 63 mg/dL — ABNORMAL HIGH (ref 8–23)
CO2: 20 mmol/L — ABNORMAL LOW (ref 22–32)
Calcium: 9 mg/dL (ref 8.9–10.3)
Chloride: 109 mmol/L (ref 98–111)
Creatinine, Ser: 1.54 mg/dL — ABNORMAL HIGH (ref 0.44–1.00)
GFR, Estimated: 35 mL/min — ABNORMAL LOW (ref >60)
Glucose, Bld: 95 mg/dL (ref 70–99)
Potassium: 4.5 mmol/L (ref 3.5–5.1)
Sodium: 139 mmol/L (ref 135–145)

## 2021-09-03 LAB — CREATININE, SERUM
Creatinine, Ser: 1.49 mg/dL — ABNORMAL HIGH (ref 0.44–1.00)
GFR, Estimated: 36 mL/min — ABNORMAL LOW (ref >60)

## 2021-09-03 LAB — ECHOCARDIOGRAM COMPLETE
AR max vel: 2.18 cm2
AV Area VTI: 2.1 cm2
AV Area mean vel: 2.05 cm2
AV Mean grad: 6 mmHg
AV Peak grad: 10.9 mmHg
Ao pk vel: 1.65 m/s
Area-P 1/2: 3.72 cm2
Height: 62 in
Weight: 2400 oz

## 2021-09-03 LAB — BODY FLUID CELL COUNT WITH DIFFERENTIAL
Eos, Fluid: 0 %
Lymphs, Fluid: 77 %
Monocyte-Macrophage-Serous Fluid: 6 % — ABNORMAL LOW (ref 50–90)
Neutrophil Count, Fluid: 17 % (ref 0–25)
Total Nucleated Cell Count, Fluid: 1485 cu mm — ABNORMAL HIGH (ref 0–1000)

## 2021-09-03 LAB — PROTEIN, PLEURAL OR PERITONEAL FLUID: Total protein, fluid: 5.2 g/dL

## 2021-09-03 LAB — CBC WITH DIFFERENTIAL/PLATELET
Abs Immature Granulocytes: 0.03 10*3/uL (ref 0.00–0.07)
Basophils Absolute: 0 10*3/uL (ref 0.0–0.1)
Basophils Relative: 0 %
Eosinophils Absolute: 0 10*3/uL (ref 0.0–0.5)
Eosinophils Relative: 0 %
HCT: 30.6 % — ABNORMAL LOW (ref 36.0–46.0)
Hemoglobin: 9.7 g/dL — ABNORMAL LOW (ref 12.0–15.0)
Immature Granulocytes: 0 %
Lymphocytes Relative: 14 %
Lymphs Abs: 1.1 10*3/uL (ref 0.7–4.0)
MCH: 22.5 pg — ABNORMAL LOW (ref 26.0–34.0)
MCHC: 31.7 g/dL (ref 30.0–36.0)
MCV: 70.8 fL — ABNORMAL LOW (ref 80.0–100.0)
Monocytes Absolute: 0.8 10*3/uL (ref 0.1–1.0)
Monocytes Relative: 10 %
Neutro Abs: 5.8 10*3/uL (ref 1.7–7.7)
Neutrophils Relative %: 76 %
Platelets: 345 10*3/uL (ref 150–400)
RBC: 4.32 MIL/uL (ref 3.87–5.11)
RDW: 14.4 % (ref 11.5–15.5)
WBC: 7.7 10*3/uL (ref 4.0–10.5)
nRBC: 0 % (ref 0.0–0.2)

## 2021-09-03 LAB — HEMOGLOBIN A1C
Hgb A1c MFr Bld: 6.4 % — ABNORMAL HIGH (ref 4.8–5.6)
Mean Plasma Glucose: 136.98 mg/dL

## 2021-09-03 LAB — PROTEIN, TOTAL: Total Protein: 6.5 g/dL (ref 6.5–8.1)

## 2021-09-03 LAB — MAGNESIUM: Magnesium: 2.6 mg/dL — ABNORMAL HIGH (ref 1.7–2.4)

## 2021-09-03 LAB — LACTATE DEHYDROGENASE: LDH: 156 U/L (ref 98–192)

## 2021-09-03 LAB — GLUCOSE, CAPILLARY
Glucose-Capillary: 77 mg/dL (ref 70–99)
Glucose-Capillary: 150 mg/dL — ABNORMAL HIGH (ref 70–99)
Glucose-Capillary: 139 mg/dL — ABNORMAL HIGH (ref 70–99)
Glucose-Capillary: 146 mg/dL — ABNORMAL HIGH (ref 70–99)

## 2021-09-03 MED ORDER — MORPHINE SULFATE (PF) 2 MG/ML IV SOLN
1.0000 mg | INTRAVENOUS | Status: DC | PRN
  Filled 2021-09-03: qty 1

## 2021-09-03 MED ORDER — ALLOPURINOL 100 MG PO TABS
100.0000 mg | ORAL_TABLET | Freq: Every day | ORAL | Status: DC
  Administered 2021-09-03 – 2021-09-08 (×6): 100 mg via ORAL
  Filled 2021-09-03 (×6): qty 1

## 2021-09-03 MED ORDER — SODIUM CHLORIDE 0.9% FLUSH
10.0000 mL | Freq: Three times a day (TID) | INTRAVENOUS | Status: DC
  Administered 2021-09-03 – 2021-09-04 (×2): 10 mL

## 2021-09-03 MED ORDER — ASPIRIN EC 81 MG PO TBEC
81.0000 mg | DELAYED_RELEASE_TABLET | Freq: Every day | ORAL | Status: DC
  Administered 2021-09-03 – 2021-09-08 (×6): 81 mg via ORAL
  Filled 2021-09-03 (×6): qty 1

## 2021-09-03 NOTE — Consult Note (Signed)
NAME:  Heather Rogers, MRN:  [id number], DOB:  1945/11/11, LOS: 0 ADMISSION DATE:  09/02/2021, CONSULTATION DATE:  09/03/21 REFERRING MD:  Sloan Leiter, CHIEF COMPLAINT:  sob   History of Present Illness:  76 year old woman with distant hx of smoking presenting with 1 week of worsening SOB, initially just with exertion now at rest.  Imaging in ER shows complete opacification of L hemithorax.  PCCM consulted for management.  Patient denies fevers, chills, pleurisy, cough, trauma, prior chest surgeries.  Does have 1-2 day hx of nausea and dry heaving.  Pertinent  Medical History  DM2 HTN CVA  Significant Hospital Events: Including procedures, antibiotic start and stop dates in addition to other pertinent events   09/03/21 consulted  Interim History / Subjective:  Consulted  Objective   Blood pressure (!) 110/92, pulse 86, temperature 98.9 F (37.2 C), temperature source Oral, resp. rate (!) 25, height 5' 2"  (1.575 m), weight 68 kg, SpO2 94 %.        Intake/Output Summary (Last 24 hours) at 09/03/2021 0859 Last data filed at 09/03/2021 0421 Gross per 24 hour  Intake 100.8 ml  Output 200 ml  Net -99.2 ml   Filed Weights   09/02/21 1827  Weight: 68 kg    Examination: General: no distress lying in bed HENT: JVP mildly prominent, trachea midline Lungs: Absent on left, clear on R, no accessory muscle use Cardiovascular: RRR, ext warm Abdomen: protuberant, soft Extremities: no edema, mild arthritic changes Neuro: moves all 4 ext to command Skin: no rashes  Renal function down LFTs benign Microcytic anemia  Resolved Hospital Problem list   N/A  Assessment & Plan:  Abnormal CXR- large effusion on [place], no infectious s/s.  Hx of smoking in distant past.  Concerned for malignancy.   - Pigtail, send usual studies, had to limit to 2L drainage initially - CT chest once pleural space drained - Updated daughter by phone - Will follow with you  Best Practice (right click and  "Reselect all SmartList Selections" daily)  Per primary  Labs   CBC: Recent Labs  Lab 09/02/21 1840 09/03/21 0037  WBC 8.9 7.7  NEUTROABS  --  5.8  HGB 9.6* 9.7*  HCT 30.7* 30.6*  MCV 71.9* 70.8*  PLT 489* 825    Basic Metabolic Panel: Recent Labs  Lab 09/02/21 1840 09/03/21 0037 09/03/21 0759  NA 136  --  139  K 5.1  --  4.5  CL 103  --  109  CO2 18*  --  20*  GLUCOSE 161*  --  95  BUN 70*  --  63*  CREATININE 1.61* 1.49* 1.54*  CALCIUM 9.6  --  9.0  MG  --  2.6*  --    GFR: Estimated Creatinine Clearance: 28.6 mL/min (A) (by C-G formula based on SCr of 1.54 mg/dL (H)). Recent Labs  Lab 09/02/21 1840 09/03/21 0037  WBC 8.9 7.7    Liver Function Tests: No results for input(s): AST, ALT, ALKPHOS, BILITOT, PROT, ALBUMIN in the last 168 hours. No results for input(s): LIPASE, AMYLASE in the last 168 hours. No results for input(s): AMMONIA in the last 168 hours.  ABG    Component Value Date/Time   TCO2 20 (L) 05/15/2018 0856     Coagulation Profile: No results for input(s): INR, PROTIME in the last 168 hours.  Cardiac Enzymes: No results for input(s): CKTOTAL, CKMB, CKMBINDEX, TROPONINI in the last 168 hours.  HbA1C: Hemoglobin A1C  Date/Time Value Ref  Range Status  05/22/2021 11:54 AM 6.2 (A) 4.0 - 5.6 % Final   HbA1c, POC (controlled diabetic range)  Date/Time Value Ref Range Status  11/17/2020 11:18 AM 6.7 0.0 - 7.0 % Final  08/28/2019 03:57 PM 6.5 0.0 - 7.0 % Final   Hgb A1c MFr Bld  Date/Time Value Ref Range Status  09/03/2021 12:37 AM 6.4 (H) 4.8 - 5.6 % Final    Comment:    (NOTE) Pre diabetes:          5.7%-6.4%  Diabetes:              >6.4%  Glycemic control for   <7.0% adults with diabetes   07/25/2020 08:06 AM 6.9 (H) 4.8 - 5.6 % Final    Comment:    (NOTE) Pre diabetes:          5.7%-6.4%  Diabetes:              >6.4%  Glycemic control for   <7.0% adults with diabetes     CBG: Recent Labs  Lab 09/02/21 2326  09/03/21 0849  GLUCAP 117* 77    Review of Systems:    Positive Symptoms in bold:  Constitutional fevers, chills, weight loss, fatigue, anorexia, malaise  Eyes decreased vision, double vision, eye irritation  Ears, Nose, Mouth, Throat sore throat, trouble swallowing, sinus congestion  Cardiovascular chest pain, paroxysmal nocturnal dyspnea, lower ext edema, palpitations   Respiratory SOB, cough, DOE, hemoptysis, wheezing  Gastrointestinal nausea, vomiting, diarrhea  Genitourinary burning with urination, trouble urinating  Musculoskeletal joint aches, joint swelling, back pain  Integumentary  rashes, skin lesions  Neurological focal weakness, focal numbness, trouble speaking, headaches  Psychiatric depression, anxiety, confusion  Endocrine polyuria, polydipsia, cold intolerance, heat intolerance  Hematologic abnormal bruising, abnormal bleeding, unexplained nose bleeds  Allergic/Immunologic recurrent infections, hives, swollen lymph nodes     Past Medical History:  She,  has a past medical history of Diabetes ([place]), Hypertension, Pneumonia due to COVID-19 virus (07/25/2020), Stroke (cerebrum) ([place]), and Stroke ([place]) (05/15/2018).   Surgical History:   Past Surgical History:  Procedure Laterality Date   EYE SURGERY Left 04/06/2020   Implant placed   EYE SURGERY Right 03/30/2020   Implant placed    hysterectomy     LOOP RECORDER INSERTION N/A 05/20/2018   Procedure: LOOP RECORDER INSERTION;  Surgeon: Thompson Grayer, MD;  Location: [place] [facility];  Service: Cardiovascular;  Laterality: N/A;   TEE WITHOUT CARDIOVERSION N/A 05/20/2018   Procedure: TRANSESOPHAGEAL ECHOCARDIOGRAM (TEE);  Surgeon: Sueanne Margarita, MD;  Location[facility] ENDOSCOPY;  Service: Cardiovascular;  Laterality: N/A;     Social History:   reports that she has quit smoking. She has never used smokeless tobacco. She reports that she does not drink alcohol and does not use drugs.   Family History:  Her  family history includes Hypertension in her father and mother; Stroke in her mother.   Allergies Allergies  Allergen Reactions   Sulfa Antibiotics     Itching      Home Medications  Prior to Admission medications   Medication Sig Start Date End Date Taking? Authorizing Provider  Accu-Chek FastClix Lancets MISC Check blood sugar fasting and before meals and again if pt feels bad (symptoms of hypo). 05/22/21  Yes Mayers, Cari S, PA-C  allopurinol (ZYLOPRIM) 100 MG tablet TAKE ONE TABLET BY MOUTH DAILY Patient taking differently: Take 100 mg by mouth daily. 05/10/21  Yes Charlott Rakes, MD  amLODipine (NORVASC) 10 MG tablet Take  1 tablet (10 mg total) by mouth daily. 05/22/21  Yes Mayers, Cari S, PA-C  aspirin EC 81 MG tablet Take 81 mg by mouth daily. Swallow whole.   Yes [provider]  atorvastatin (LIPITOR) 80 MG tablet TAKE ONE TABLET BY MOUTH EVERY EVENING TO LOWER CHOLESTEROL Patient taking differently: Take 80 mg by mouth every evening. 05/22/21  Yes Mayers, Cari S, PA-C  Blood Glucose Monitoring Suppl (ACCU-CHEK GUIDE) w/Device KIT 1 each by Does not apply route as directed. 08/25/18  Yes Fulp, Cammie, MD  carvedilol (COREG) 25 MG tablet Take 1 tablet (25 mg total) by mouth 2 (two) times daily with a meal. 05/22/21  Yes Mayers, Cari S, PA-C  cholecalciferol (VITAMIN D3) 25 MCG (1000 UNIT) tablet Take 1,000 Units by mouth daily.   Yes [provider]  ferrous sulfate 325 (65 FE) MG EC tablet Take 1 tablet (325 mg total) by mouth in the morning and at bedtime. 05/22/21  Yes Mayers, Cari S, PA-C  gabapentin (NEURONTIN) 100 MG capsule Take 1 capsule (100 mg total) by mouth 3 (three) times daily. 05/22/21  Yes Mayers, Cari S, PA-C  glucose blood (ACCU-CHEK GUIDE) test strip USE TO CHECK FASTING BLOOD SUGAR AND BEFORE MEALS AND AGAIN IF PATIENT FEELS BAD; SYMPTOMS OF HYPOGLYCEMIA 05/16/21  Yes Dorna Mai, MD  Metamucil Fiber CHEW Chew 1 tablet by mouth daily.   Yes  [provider]  metFORMIN (GLUCOPHAGE) 500 MG tablet TAKE ONE TABLET BY MOUTH TWICE A DAY WITH A MEAL Patient taking differently: Take 500 mg by mouth 2 (two) times daily with a meal. 05/22/21  Yes Mayers, Cari S, PA-C  valsartan-hydrochlorothiazide (DIOVAN-HCT) 320-12.5 MG tablet Take 1 tablet by mouth daily. 05/22/21  Yes Mayers, Cari S, PA-C  STUDY - [place] - apixaban 34m or placebo (PI - Sethi) Take 5 mg by mouth 2 (two) times daily. Patient not taking: Reported on 09/02/2021    [provider]  Study - [place] - aspirin 815mor Placebo Take 81 mg by mouth daily. Patient not taking: Reported on 09/02/2021    [provider]

## 2021-09-03 NOTE — Procedures (Signed)
Insertion of Chest Tube Procedure Note  LYNDELL GILLYARD  [id number]  03/15/1946  Date:09/03/21  Time:8:56 AM    Provider Performing: Candee Furbish   Procedure: Pleural Catheter Insertion w/ Imaging Guidance [phone])  Indication(s) Effusion  Consent Risks of the procedure as well as the alternatives and risks of each were explained to the patient and/or caregiver.  Consent for the procedure was obtained and is signed in the bedside chart  Anesthesia Topical only with 1% lidocaine    Time Out Verified patient identification, verified procedure, site/side was marked, verified correct patient position, special equipment/implants available, medications/allergies/relevant history reviewed, required imaging and test results available.   Sterile Technique Maximal sterile technique including full sterile barrier drape, hand hygiene, sterile gown, sterile gloves, mask, hair covering, sterile ultrasound probe cover (if used).   Procedure Description Ultrasound used to identify appropriate pleural anatomy for placement and overlying skin marked. Area of placement cleaned and draped in sterile fashion.  A 14 French pigtail pleural catheter was placed into the left pleural space using Seldinger technique. Appropriate return of fluid was obtained.  The tube was connected to atrium and placed on -20 cm H2O wall suction.   Complications/Tolerance None; patient tolerated the procedure well. Chest X-ray is ordered to verify placement.   EBL Minimal  Specimen(s) fluid

## 2021-09-03 NOTE — Assessment & Plan Note (Signed)
Continue statin. 

## 2021-09-03 NOTE — Assessment & Plan Note (Addendum)
BP controlled-continue Coreg-no longer on amlodipine/valsartan and HCTZ.  Avoid diuretics unless she develops volume overload-she has HOCM and will need to watch for excessive preload reduction.

## 2021-09-03 NOTE — Hospital Course (Addendum)
Patient is a 76 y.o.  female CVA with right hemiparesis, DM-2, HTN-presenting with shortness of breath-found to have a large left-sided pleural effusion.  Further work-up revealed exudative pleural effusion- likely due to metastatic breast cancer.  See below for further details  Significant Hospital events: 2/4>> admit to [facility] for shortness of breath-found to have large left sided pleural effusion.  Significant imaging studies: 2/4>> CXR: Left lung whiteout 2/5>> bilateral lower extremity Doppler: Negative 2/5>> Echo: EF 60-65%, asymmetric septal hypertrophy-consistent with HOCUM 2/5>> CT chest: 10 cm left breast mass, axillary/mediastinal lymphadenopathy, suspicion for lymphangitic spread, blastic/lytic lesions throughout the sternum.  2.1 cm dense right thyroid lobe nodule. 2/7>> CXR: Worsening left pleural effusion  Significant microbiology data: 2/4>> FLU/COVID PCR: Negative 2/5>> pleural fluid cultures: No growth  Cytology/pathology data: 2/5>> pleural fluid cytology: Malignant cells consistent with metastatic adenocarcinoma  Procedures: 2/5>> chest tube insertion 2/9>> Pleurx catheter insertion   Consults:  PCCM, oncology

## 2021-09-03 NOTE — Progress Notes (Signed)
°   09/03/21 2111  Assess: MEWS Score  BP 94/66  Pulse Rate 79  ECG Heart Rate 78  Resp (!) 24  Level of Consciousness Alert  SpO2 92 %  O2 Device Nasal Cannula  Patient Activity (if Appropriate) In bed  O2 Flow Rate (L/min) 3 L/min

## 2021-09-03 NOTE — Progress Notes (Signed)
Assisted Dr Tamala Julian with bedside Left Chest Tube placement.  Patient tolerated well.   Quickly drained 2000 cc serous to sero-sanguinous pleural fluid.  Dr Tamala Julian notified.  Verbal order to clamp Chest tube until he sees patient again later today.   Patient is coughing and has mild to moderate discomfort.  Sam RN at bedside.  Order received for pain medication.   Awaiting Chest Xray RN to call if assistance needed.

## 2021-09-03 NOTE — Assessment & Plan Note (Signed)
Stable/no flare-continue allopurinol.

## 2021-09-03 NOTE — Progress Notes (Signed)
PROGRESS NOTE        PATIENT DETAILS Name: Heather Rogers Age: 76 y.o. Sex: female Date of Birth: [birth year] Admit Date: 09/02/2021 Admitting Physician Kristopher Oppenheim, DO IRJ:JOACZY, Charlane Ferretti, MD  Brief Summary: Patient is a 76 y.o.  female CVA with right hemiparesis, DM-2, HTN-presenting with shortness of breath-found to have a large left-sided pleural effusion.  Significant Hospital events: 2/4>> admit to [facility] for shortness of breath-found to have large left sided pleural effusion.  Significant imaging studies: 2/4>> CXR: Left lung whiteout  Significant microbiology data: 2/4>> FLU/COVID PCR: Negative  Procedures: None  Consults:  PCCM  Subjective: Some pain at the chest tube insertion site-otherwise appears comfortable.  Objective: Vitals: Blood pressure (!) 107/55, pulse 84, temperature 98.1 F (36.7 C), temperature source Oral, resp. rate 20, height 5\' 2"  (1.575 m), weight 68 kg, SpO2 92 %.   Exam: Gen Exam:Alert awake-not in any distress HEENT:atraumatic, normocephalic Chest: B/L clear to auscultation anteriorly CVS:S1S2 regular Abdomen:soft non tender, non distended Extremities:no edema Neurology: Non focal Skin: no rash  Pertinent Labs/Radiology: CBC Latest Ref Rng & Units 09/03/2021 09/02/2021 05/24/2021  WBC 4.0 - 10.5 K/uL 7.7 8.9 6.0  Hemoglobin 12.0 - 15.0 g/dL 9.7(L) 9.6(L) 10.3(L)  Hematocrit 36.0 - 46.0 % 30.6(L) 30.7(L) 32.4(L)  Platelets 150 - 400 K/uL 345 489(H) 345    Lab Results  Component Value Date   NA 139 09/03/2021   K 4.5 09/03/2021   CL 109 09/03/2021   CO2 20 (L) 09/03/2021      Assessment/Plan: * Pleural effusion on left- (present on admission) S/p left-sided pigtail chest tube placement by PCCM on 2/5, awaiting further studies to determine whether this is a transudate or exudate.  CT chest being planned by PCCM.  Will await further recommendations.  AKI (acute kidney injury) ([place]) Likely  hemodynamically mediated-slowly improving continue to hold diuretics-recheck electrolytes in a.m.  Microcytic anemia- (present on admission) Appears to be a chronic issue-iron panel not suggestive of iron deficiency anemia.  Defer further to the outpatient setting..  Type 2 diabetes mellitus (A1c 6.4 on 2/5) - (present on admission) CBG stable with SSI.  Resume on discharge.   Recent Labs    09/02/21 2326 09/03/21 0849 09/03/21 1144  GLUCAP 117* 77 150*    Hypertension associated with diabetes ([place])- (present on admission) BP stable on Coreg/amlodipine.  Follow and adjust.  Dyslipidemia- (present on admission) Continue statin.  Gout- (present on admission) Stable/no flare-continue allopurinol.  History of CVA Appears to move all 4 extremities symmetrically-on aspirin/statin.  Positive D dimer Not hypoxic-unclear significance-check lower extremity Dopplers.  Once renal function improves-can contemplate doing a CTA chest at some point.  Coagulopathy ([place])- (present on admission) dtr reports pt was on Eliquis research study protocol but stopped taking any research meds 3 weeks ago. Pt only takes 81 mg ASA at this point.   BMI: Estimated body mass index is 27.44 kg/m as calculated from the following:   Height as of this encounter: 5\' 2"  (1.575 m).   Weight as of this encounter: 68 kg.   Code status:   Code Status: Full Code   DVT Prophylaxis: heparin injection 5,000 Units Start: 09/03/21 2200 SCDs Start: 09/02/21 2259   Family Communication: None at bedside   Disposition Plan: Status is: Observation The patient will require care spanning > 2 midnights and should be  moved to inpatient because: Chest tube in place-awaiting further work-up-not yet stable for discharge.   Planned Discharge Destination:Home   Diet: Diet Order             Diet heart healthy/carb modified Room service appropriate? Yes; Fluid consistency: Thin  Diet effective now                      Antimicrobial agents: Anti-infectives (From admission, onward)    Start     Dose/Rate Route Frequency Ordered Stop   09/02/21 2030  cefTRIAXone (ROCEPHIN) 1 g in sodium chloride 0.9 % 100 mL IVPB        1 g 200 mL/hr over 30 Minutes Intravenous  Once 09/02/21 2027 09/02/21 2138   09/02/21 2030  azithromycin (ZITHROMAX) 500 mg in sodium chloride 0.9 % 250 mL IVPB        500 mg 250 mL/hr over 60 Minutes Intravenous  Once 09/02/21 2027 09/02/21 2238        MEDICATIONS: Scheduled Meds:  allopurinol  100 mg Oral Daily   amLODipine  10 mg Oral Daily   aspirin EC  81 mg Oral Daily   atorvastatin  80 mg Oral QHS   carvedilol  25 mg Oral BID WC   gabapentin  100 mg Oral TID   heparin  5,000 Units Subcutaneous Q8H   insulin aspart  0-5 Units Subcutaneous QHS   insulin aspart  0-9 Units Subcutaneous TID WC   sodium chloride flush  10 mL Other Q8H   Continuous Infusions: PRN Meds:.acetaminophen **OR** acetaminophen, albuterol, melatonin, morphine injection, ondansetron **OR** ondansetron (ZOFRAN) IV   I have personally reviewed following labs and imaging studies  LABORATORY DATA: CBC: Recent Labs  Lab 09/02/21 1840 09/03/21 0037  WBC 8.9 7.7  NEUTROABS  --  5.8  HGB 9.6* 9.7*  HCT 30.7* 30.6*  MCV 71.9* 70.8*  PLT 489* 856    Basic Metabolic Panel: Recent Labs  Lab 09/02/21 1840 09/03/21 0037 09/03/21 0759  NA 136  --  139  K 5.1  --  4.5  CL 103  --  109  CO2 18*  --  20*  GLUCOSE 161*  --  95  BUN 70*  --  63*  CREATININE 1.61* 1.49* 1.54*  CALCIUM 9.6  --  9.0  MG  --  2.6*  --     GFR: Estimated Creatinine Clearance: 28.6 mL/min (A) (by C-G formula based on SCr of 1.54 mg/dL (H)).  Liver Function Tests: No results for input(s): AST, ALT, ALKPHOS, BILITOT, PROT, ALBUMIN in the last 168 hours. No results for input(s): LIPASE, AMYLASE in the last 168 hours. No results for input(s): AMMONIA in the last 168 hours.  Coagulation Profile: No  results for input(s): INR, PROTIME in the last 168 hours.  Cardiac Enzymes: No results for input(s): CKTOTAL, CKMB, CKMBINDEX, TROPONINI in the last 168 hours.  BNP (last 3 results) No results for input(s): PROBNP in the last 8760 hours.  Lipid Profile: No results for input(s): CHOL, HDL, LDLCALC, TRIG, CHOLHDL, LDLDIRECT in the last 72 hours.  Thyroid Function Tests: Recent Labs    09/02/21 1840  TSH 3.246    Anemia Panel: Recent Labs    09/02/21 1840  FERRITIN 894*  TIBC 223*  IRON 40    Urine analysis:    Component Value Date/Time   COLORURINE YELLOW 07/25/2020 1057   APPEARANCEUR HAZY (A) 07/25/2020 1057   LABSPEC 1.017 07/25/2020 [place]  5.0 07/25/2020 1057   GLUCOSEU NEGATIVE 07/25/2020 1057   HGBUR SMALL (A) 07/25/2020 1057   BILIRUBINUR NEGATIVE 07/25/2020 1057   KETONESUR 5 (A) 07/25/2020 1057   PROTEINUR 30 (A) 07/25/2020 1057   UROBILINOGEN 0.2 12/20/2009 1049   NITRITE NEGATIVE 07/25/2020 1057   LEUKOCYTESUR LARGE (A) 07/25/2020 1057    Sepsis Labs: Lactic Acid, Venous No results found for: LATICACIDVEN  MICROBIOLOGY: Recent Results (from the past 240 hour(s))  Resp Panel by RT-PCR (Flu A&B, Covid) Nasopharyngeal Swab     Status: None   Collection Time: 09/02/21  6:40 PM   Specimen: Nasopharyngeal Swab; Nasopharyngeal(NP) swabs in vial transport medium  Result Value Ref Range Status   SARS Coronavirus 2 by RT PCR NEGATIVE NEGATIVE Final    Comment: (NOTE) SARS-CoV-2 target nucleic acids are NOT DETECTED.  The SARS-CoV-2 RNA is generally detectable in upper respiratory specimens during the acute phase of infection. The lowest concentration of SARS-CoV-2 viral copies this assay can detect is 138 copies/mL. A negative result does not preclude SARS-Cov-2 infection and should not be used as the sole basis for treatment or other patient management decisions. A negative result may occur with  improper specimen collection/handling, submission  of specimen other than nasopharyngeal swab, presence of viral mutation(s) within the areas targeted by this assay, and inadequate number of viral copies(<138 copies/mL). A negative result must be combined with clinical observations, patient history, and epidemiological information. The expected result is Negative.  Fact Sheet for Patients:  EntrepreneurPulse.com.au  Fact Sheet for Healthcare Providers:  IncredibleEmployment.be  This test is no t yet approved or cleared by the [place] FDA and  has been authorized for detection and/or diagnosis of SARS-CoV-2 by FDA under an Emergency Use Authorization (EUA). This EUA will remain  in effect (meaning this test can be used) for the duration of the COVID-19 declaration under Section 564(b)(1) of the Act, 21 U.S.C.section 360bbb-3(b)(1), unless the authorization is terminated  or revoked sooner.       Influenza A by PCR NEGATIVE NEGATIVE Final   Influenza B by PCR NEGATIVE NEGATIVE Final    Comment: (NOTE) The Xpert Xpress SARS-CoV-2/FLU/RSV plus assay is intended as an aid in the diagnosis of influenza from Nasopharyngeal swab specimens and should not be used as a sole basis for treatment. Nasal washings and aspirates are unacceptable for Xpert Xpress SARS-CoV-2/FLU/RSV testing.  Fact Sheet for Patients: EntrepreneurPulse.com.au  Fact Sheet for Healthcare Providers: IncredibleEmployment.be  This test is not yet approved or cleared by the [place] FDA and has been authorized for detection and/or diagnosis of SARS-CoV-2 by FDA under an Emergency Use Authorization (EUA). This EUA will remain in effect (meaning this test can be used) for the duration of the COVID-19 declaration under Section 564(b)(1) of the Act, 21 U.S.C. section 360bbb-3(b)(1), unless the authorization is terminated or revoked.  Performed at [facility], [place] [address],  [place], [place] [address]     RADIOLOGY STUDIES/RESULTS: DG Chest 2 View  Result Date: 09/02/2021 CLINICAL DATA:  Shortness of breath EXAM: CHEST - 2 VIEW COMPARISON:  07/25/2020 FINDINGS: Complete whiteout of the left hemithorax with no aeration demonstrated in the left lung. Changes could represent either or combination of pleural effusion, atelectasis, and/or consolidation. The right lung is clear. Heart size is obscured by the left chest process. Degenerative changes in the spine and shoulders. A loop recorder is present. IMPRESSION: Complete whiteout of the left lung suggesting pleural effusion, atelectasis, and/or consolidation. Electronically Signed  By: Lucienne Capers M.D.   On: 09/02/2021 19:26   DG Chest Port 1 View  Result Date: 09/03/2021 CLINICAL DATA:  Status post chest tube insertion. EXAM: PORTABLE CHEST 1 VIEW COMPARISON:  09/02/2021. FINDINGS: Cardiac enlargement is unchanged. Loop recorder is again noted in the projection of the left side of heart. Interval placement of left lateral basilar chest tube. Significant interval improvement in aeration to the left lung. No pneumothorax identified. Residual hazy opacities throughout the left lung are now visualized. Right lung is clear. IMPRESSION: 1. Significant interval improvement in aeration to the left lung status post left chest tube placement. Residual hazy opacities throughout the left lung. 2. No pneumothorax. Electronically Signed   By: Kerby Moors M.D.   On: 09/03/2021 10:39   VAS [place] LOWER EXTREMITY VENOUS (DVT)  Result Date: 09/03/2021  Lower Venous DVT Study Patient Name:  ENAS WINCHEL  Date of Exam:   09/03/2021 Medical Rec #: [id number]           Accession #:    [id number] Date of Birth: October 21, 1945           Patient Gender: F Patient Age:   64 years Exam Location:  [facility] Procedure:      VAS [place] LOWER EXTREMITY VENOUS (DVT) Referring Phys: Oren Binet  --------------------------------------------------------------------------------  Indications: Swelling, Edema, and elevated ddimer.  Comparison Study: no prior Performing Technologist: Archie Patten RVS  Examination Guidelines: A complete evaluation includes B-mode imaging, spectral Doppler, color Doppler, and power Doppler as needed of all accessible portions of each vessel. Bilateral testing is considered an integral part of a complete examination. Limited examinations for reoccurring indications may be performed as noted. The reflux portion of the exam is performed with the patient in reverse Trendelenburg.  +---------+---------------+---------+-----------+----------+--------------+  RIGHT     Compressibility Phasicity Spontaneity Properties Thrombus Aging  +---------+---------------+---------+-----------+----------+--------------+  CFV       Full            Yes       Yes                                    +---------+---------------+---------+-----------+----------+--------------+  SFJ       Full                                                             +---------+---------------+---------+-----------+----------+--------------+  FV Prox   Full                                                             +---------+---------------+---------+-----------+----------+--------------+  FV Mid    Full                                                             +---------+---------------+---------+-----------+----------+--------------+  FV Distal Full                                                             +---------+---------------+---------+-----------+----------+--------------+  PFV       Full                                                             +---------+---------------+---------+-----------+----------+--------------+  POP       Full            Yes       Yes                                    +---------+---------------+---------+-----------+----------+--------------+  PTV       Full                                                              +---------+---------------+---------+-----------+----------+--------------+  PERO      Full                                                             +---------+---------------+---------+-----------+----------+--------------+   +---------+---------------+---------+-----------+----------+--------------+  LEFT      Compressibility Phasicity Spontaneity Properties Thrombus Aging  +---------+---------------+---------+-----------+----------+--------------+  CFV       Full            Yes       Yes                                    +---------+---------------+---------+-----------+----------+--------------+  SFJ       Full                                                             +---------+---------------+---------+-----------+----------+--------------+  FV Prox   Full                                                             +---------+---------------+---------+-----------+----------+--------------+  FV Mid    Full                                                             +---------+---------------+---------+-----------+----------+--------------+  FV Distal Full                                                             +---------+---------------+---------+-----------+----------+--------------+  PFV       Full                                                             +---------+---------------+---------+-----------+----------+--------------+  POP       Full            Yes       Yes                                    +---------+---------------+---------+-----------+----------+--------------+  PTV       Full                                                             +---------+---------------+---------+-----------+----------+--------------+  PERO      Full                                                             +---------+---------------+---------+-----------+----------+--------------+     Summary: BILATERAL: - No evidence of deep vein thrombosis seen in the lower  extremities, bilaterally. -No evidence of popliteal cyst, bilaterally.   *See table(s) above for measurements and observations.    Preliminary      LOS: 0 days   Oren Binet, MD  [facility]    To contact the attending provider between 7A-7P or the covering provider during after hours 7P-7A, please log into the web site [email or web address] and access using universal [place] password for that web site. If you do not have the password, please call the hospital operator.  09/03/2021, 12:05 PM

## 2021-09-03 NOTE — Progress Notes (Signed)
°  Echocardiogram 2D Echocardiogram has been performed.  Merrie Roof F 09/03/2021, 2:10 PM

## 2021-09-03 NOTE — Assessment & Plan Note (Addendum)
Not hypoxic-unclear significance-lower extremity Dopplers are negative-doubt further work-up is required at this point.

## 2021-09-03 NOTE — Plan of Care (Signed)
°  Problem: Education: Goal: Knowledge of General Education information will improve Description: Including pain rating scale, medication(s)/side effects and non-pharmacologic comfort measures Outcome: Progressing   Problem: Coping: Goal: Level of anxiety will decrease Outcome: Progressing   Problem: Nutrition: Goal: Adequate nutrition will be maintained Outcome: Not Progressing

## 2021-09-03 NOTE — Progress Notes (Signed)
Lower extremity venous has been completed.   Preliminary results in CV Proc.   Heather Rogers 09/03/2021 10:27 AM

## 2021-09-04 ENCOUNTER — Encounter (HOSPITAL_COMMUNITY): Payer: Self-pay | Admitting: Internal Medicine

## 2021-09-04 DIAGNOSIS — I421 Obstructive hypertrophic cardiomyopathy: Secondary | ICD-10-CM | POA: Diagnosis present

## 2021-09-04 DIAGNOSIS — N179 Acute kidney failure, unspecified: Secondary | ICD-10-CM | POA: Diagnosis not present

## 2021-09-04 DIAGNOSIS — J90 Pleural effusion, not elsewhere classified: Secondary | ICD-10-CM

## 2021-09-04 DIAGNOSIS — N6320 Unspecified lump in the left breast, unspecified quadrant: Secondary | ICD-10-CM | POA: Diagnosis present

## 2021-09-04 DIAGNOSIS — E785 Hyperlipidemia, unspecified: Secondary | ICD-10-CM | POA: Diagnosis not present

## 2021-09-04 DIAGNOSIS — D649 Anemia, unspecified: Secondary | ICD-10-CM | POA: Diagnosis not present

## 2021-09-04 DIAGNOSIS — R4189 Other symptoms and signs involving cognitive functions and awareness: Secondary | ICD-10-CM | POA: Diagnosis present

## 2021-09-04 DIAGNOSIS — I422 Other hypertrophic cardiomyopathy: Secondary | ICD-10-CM | POA: Diagnosis present

## 2021-09-04 LAB — CBC
HCT: 28.6 % — ABNORMAL LOW (ref 36.0–46.0)
Hemoglobin: 8.8 g/dL — ABNORMAL LOW (ref 12.0–15.0)
MCH: 22.1 pg — ABNORMAL LOW (ref 26.0–34.0)
MCHC: 30.8 g/dL (ref 30.0–36.0)
MCV: 71.9 fL — ABNORMAL LOW (ref 80.0–100.0)
Platelets: 392 10*3/uL (ref 150–400)
RBC: 3.98 MIL/uL (ref 3.87–5.11)
RDW: 14.4 % (ref 11.5–15.5)
WBC: 6.1 10*3/uL (ref 4.0–10.5)
nRBC: 0 % (ref 0.0–0.2)

## 2021-09-04 LAB — COMPREHENSIVE METABOLIC PANEL
ALT: 61 U/L — ABNORMAL HIGH (ref 0–44)
AST: 64 U/L — ABNORMAL HIGH (ref 15–41)
Albumin: 2.2 g/dL — ABNORMAL LOW (ref 3.5–5.0)
Alkaline Phosphatase: 50 U/L (ref 38–126)
Anion gap: 11 (ref 5–15)
BUN: 60 mg/dL — ABNORMAL HIGH (ref 8–23)
CO2: 21 mmol/L — ABNORMAL LOW (ref 22–32)
Calcium: 8.9 mg/dL (ref 8.9–10.3)
Chloride: 106 mmol/L (ref 98–111)
Creatinine, Ser: 1.78 mg/dL — ABNORMAL HIGH (ref 0.44–1.00)
GFR, Estimated: 29 mL/min — ABNORMAL LOW (ref >60)
Glucose, Bld: 116 mg/dL — ABNORMAL HIGH (ref 70–99)
Potassium: 4.1 mmol/L (ref 3.5–5.1)
Sodium: 138 mmol/L (ref 135–145)
Total Bilirubin: 0.2 mg/dL — ABNORMAL LOW (ref 0.3–1.2)
Total Protein: 6.1 g/dL — ABNORMAL LOW (ref 6.5–8.1)

## 2021-09-04 LAB — GLUCOSE, CAPILLARY
Glucose-Capillary: 133 mg/dL — ABNORMAL HIGH (ref 70–99)
Glucose-Capillary: 229 mg/dL — ABNORMAL HIGH (ref 70–99)
Glucose-Capillary: 134 mg/dL — ABNORMAL HIGH (ref 70–99)
Glucose-Capillary: 204 mg/dL — ABNORMAL HIGH (ref 70–99)

## 2021-09-04 MED ORDER — AMLODIPINE BESYLATE 5 MG PO TABS: 5.0000 mg | ORAL_TABLET | Freq: Every day | ORAL | Status: DC

## 2021-09-04 NOTE — Assessment & Plan Note (Addendum)
New finding on echo-already on beta-blocker.  No history of syncope.  Avoid excessive reduction of preload.  Will need outpatient cardiology evaluation (spoke with cardiology coordinator-Trish for outpatient appointment).

## 2021-09-04 NOTE — Progress Notes (Addendum)
PROGRESS NOTE        PATIENT DETAILS Name: Heather Rogers Age: 76 y.o. Sex: female Date of Birth: January 18, 1946 Admit Date: 09/02/2021 Admitting Physician Evalee Mutton Kristeen Mans, MD YJE:HUDJSH, Charlane Ferretti, MD  Brief Summary: Patient is a 76 y.o.  female CVA with right hemiparesis, DM-2, HTN-presenting with shortness of breath-found to have a large left-sided pleural effusion.  Further work-up revealed exudative pleural effusion- likely due to metastatic breast cancer.  See below for further details  Significant Hospital events: 2/4>> admit to [facility] for shortness of breath-found to have large left sided pleural effusion.  Significant imaging studies: 2/4>> CXR: Left lung whiteout 2/5>> bilateral lower extremity Doppler: Negative 2/5>> Echo: EF 60-65%, asymmetric septal hypertrophy-consistent with HOCUM 2/5>> CT chest: 10 cm left breast mass, axillary/mediastinal lymphadenopathy, suspicion for lymphangitic spread, blastic/lytic lesions throughout the sternum.  2.1 cm dense right thyroid lobe nodule.  Significant microbiology data: 2/4>> FLU/COVID PCR: Negative  Procedures: 2/5>> chest tube insertion  Consults:  PCCM  Subjective: Lying comfortably in bed-minimal pain at the chest tube insertion site.  Seems to have some mild chronic cognitive dysfunction at baseline (daughter acknowledges as well)  Objective: Vitals: Blood pressure (!) 108/53, pulse 88, temperature 98 F (36.7 C), resp. rate (!) 25, height 5\' 2"  (1.575 m), weight 64.8 kg, SpO2 (!) 89 %.   Exam: Gen Exam:Alert awake-not in any distress HEENT:atraumatic, normocephalic Chest: B/L clear to auscultation anteriorly.  Large left breast mass-hard in consistency easily palpated (RN chaperoning at bedside) CVS:S1S2 regular Abdomen:soft non tender, non distended Extremities:no edema Neurology: Non focal Skin: no rash   Pertinent Labs/Radiology: CBC Latest Ref Rng & Units 09/04/2021 09/03/2021 09/02/2021   WBC 4.0 - 10.5 K/uL 6.1 7.7 8.9  Hemoglobin 12.0 - 15.0 g/dL 8.8(L) 9.7(L) 9.6(L)  Hematocrit 36.0 - 46.0 % 28.6(L) 30.6(L) 30.7(L)  Platelets 150 - 400 K/uL 392 345 489(H)    Lab Results  Component Value Date   NA 138 09/04/2021   K 4.1 09/04/2021   CL 106 09/04/2021   CO2 21 (L) 09/04/2021      Assessment/Plan: * Pleural effusion on left- (present on admission) S/p left-sided pigtail chest tube placement by PCCM on 2/5.  Pleural fluid analysis consistent with an exudate-given findings on CT chest-highly suspicion that this is due to stage IV breast cancer.  Will defer further to PCCM-probably will need pleurodesis or a Pleurx catheter  Breast mass, left- (present on admission) See CT chest findings-along with pleural effusion-highly suspicious that this is all due to metastatic breast cancer.  Oncology consulted today.  Await cytology from pleural fluid.  AKI (acute kidney injury) ([place]) Likely hemodynamically mediated-creatinine still elevated-more than baseline-continue to avoid nephrotoxic agents and follow periodically.  Microcytic anemia- (present on admission) Appears to be a chronic issue-iron panel not suggestive of iron deficiency anemia.  Defer further to the outpatient setting.Marland Kitchen  HOCM (hypertrophic obstructive cardiomyopathy) ([place])- (present on admission) New finding on echo-already on beta-blocker.  Avoid excessive reduction of preload.  Will need outpatient cardiology evaluation.  Type 2 diabetes mellitus (A1c 6.4 on 2/5) - (present on admission) CBG stable with SSI.  Resume on discharge.   Recent Labs    09/03/21 2109 09/04/21 0806 09/04/21 1141  GLUCAP 146* 133* 229*    Hypertension associated with diabetes ([place])- (present on admission) BP on the soft side-continue Coreg-we will stop  amlodipine and follow.  Dyslipidemia- (present on admission) Continue statin.  Gout- (present on admission) Stable/no flare-continue allopurinol.  History of  CVA Appears to move all 4 extremities symmetrically-on aspirin/statin.  Cognitive decline- (present on admission) Daughter acknowledges some cognitive dysfunction at baseline-after CVA-needs formal neuropsychological testing in the outpatient setting.  May have some element of dementia/vascular dementia at baseline.  Positive D dimer Not hypoxic-unclear significance-lower extremity Dopplers are negative-doubt further work-up is required at this point.  Coagulopathy ([place])- (present on admission) dtr reports pt was on Eliquis research study protocol but stopped taking any research meds 3 weeks ago. Pt only takes 81 mg ASA at this point.   BMI: Estimated body mass index is 26.13 kg/m as calculated from the following:   Height as of this encounter: 5\' 2"  (1.575 m).   Weight as of this encounter: 64.8 kg.   Code status:   Code Status: Full Code   DVT Prophylaxis: heparin injection 5,000 Units Start: 09/03/21 2200 SCDs Start: 09/02/21 2259   Family Communication: Daughter-[name] Dogget-[phone] updated over the phone on 2/6   Disposition Plan: Status is: Observation The patient will require care spanning > 2 midnights and should be moved to inpatient because: Chest tube in place-awaiting further work-up-not yet stable for discharge.   Planned Discharge Destination:Home   Diet: Diet Order             Diet heart healthy/carb modified Room service appropriate? Yes; Fluid consistency: Thin  Diet effective now                     Antimicrobial agents: Anti-infectives (From admission, onward)    Start     Dose/Rate Route Frequency Ordered Stop   09/02/21 2030  cefTRIAXone (ROCEPHIN) 1 g in sodium chloride 0.9 % 100 mL IVPB        1 g 200 mL/hr over 30 Minutes Intravenous  Once 09/02/21 2027 09/02/21 2138   09/02/21 2030  azithromycin (ZITHROMAX) 500 mg in sodium chloride 0.9 % 250 mL IVPB        500 mg 250 mL/hr over 60 Minutes Intravenous  Once 09/02/21 2027  09/02/21 2238        MEDICATIONS: Scheduled Meds:  allopurinol  100 mg Oral Daily   aspirin EC  81 mg Oral Daily   atorvastatin  80 mg Oral QHS   carvedilol  25 mg Oral BID WC   gabapentin  100 mg Oral TID   heparin  5,000 Units Subcutaneous Q8H   insulin aspart  0-5 Units Subcutaneous QHS   insulin aspart  0-9 Units Subcutaneous TID WC   Continuous Infusions: PRN Meds:.acetaminophen **OR** acetaminophen, albuterol, melatonin, morphine injection, ondansetron **OR** ondansetron (ZOFRAN) IV   I have personally reviewed following labs and imaging studies  LABORATORY DATA: CBC: Recent Labs  Lab 09/02/21 1840 09/03/21 0037 09/04/21 0350  WBC 8.9 7.7 6.1  NEUTROABS  --  5.8  --   HGB 9.6* 9.7* 8.8*  HCT 30.7* 30.6* 28.6*  MCV 71.9* 70.8* 71.9*  PLT 489* 345 784    Basic Metabolic Panel: Recent Labs  Lab 09/02/21 1840 09/03/21 0037 09/03/21 0759 09/04/21 0350  NA 136  --  139 138  K 5.1  --  4.5 4.1  CL 103  --  109 106  CO2 18*  --  20* 21*  GLUCOSE 161*  --  95 116*  BUN 70*  --  63* 60*  CREATININE 1.61* 1.49* 1.54* 1.78*  CALCIUM 9.6  --  9.0 8.9  MG  --  2.6*  --   --     GFR: Estimated Creatinine Clearance: 24.1 mL/min (A) (by C-G formula based on SCr of 1.78 mg/dL (H)).  Liver Function Tests: Recent Labs  Lab 09/03/21 0759 09/04/21 0350  AST  --  64*  ALT  --  61*  ALKPHOS  --  50  BILITOT  --  0.2*  PROT 6.5 6.1*  ALBUMIN  --  2.2*   No results for input(s): LIPASE, AMYLASE in the last 168 hours. No results for input(s): AMMONIA in the last 168 hours.  Coagulation Profile: No results for input(s): INR, PROTIME in the last 168 hours.  Cardiac Enzymes: No results for input(s): CKTOTAL, CKMB, CKMBINDEX, TROPONINI in the last 168 hours.  BNP (last 3 results) No results for input(s): PROBNP in the last 8760 hours.  Lipid Profile: No results for input(s): CHOL, HDL, LDLCALC, TRIG, CHOLHDL, LDLDIRECT in the last 72 hours.  Thyroid  Function Tests: Recent Labs    09/02/21 1840  TSH 3.246    Anemia Panel: Recent Labs    09/02/21 1840  FERRITIN 894*  TIBC 223*  IRON 40    Urine analysis:    Component Value Date/Time   COLORURINE YELLOW 07/25/2020 1057   APPEARANCEUR HAZY (A) 07/25/2020 1057   LABSPEC 1.017 07/25/2020 1057   PHURINE 5.0 07/25/2020 1057   GLUCOSEU NEGATIVE 07/25/2020 1057   HGBUR SMALL (A) 07/25/2020 1057   BILIRUBINUR NEGATIVE 07/25/2020 1057   KETONESUR 5 (A) 07/25/2020 1057   PROTEINUR 30 (A) 07/25/2020 1057   UROBILINOGEN 0.2 12/20/2009 1049   NITRITE NEGATIVE 07/25/2020 1057   LEUKOCYTESUR LARGE (A) 07/25/2020 1057    Sepsis Labs: Lactic Acid, Venous No results found for: LATICACIDVEN  MICROBIOLOGY: Recent Results (from the past 240 hour(s))  Resp Panel by RT-PCR (Flu A&B, Covid) Nasopharyngeal Swab     Status: None   Collection Time: 09/02/21  6:40 PM   Specimen: Nasopharyngeal Swab; Nasopharyngeal(NP) swabs in vial transport medium  Result Value Ref Range Status   SARS Coronavirus 2 by RT PCR NEGATIVE NEGATIVE Final    Comment: (NOTE) SARS-CoV-2 target nucleic acids are NOT DETECTED.  The SARS-CoV-2 RNA is generally detectable in upper respiratory specimens during the acute phase of infection. The lowest concentration of SARS-CoV-2 viral copies this assay can detect is 138 copies/mL. A negative result does not preclude SARS-Cov-2 infection and should not be used as the sole basis for treatment or other patient management decisions. A negative result may occur with  improper specimen collection/handling, submission of specimen other than nasopharyngeal swab, presence of viral mutation(s) within the areas targeted by this assay, and inadequate number of viral copies(<138 copies/mL). A negative result must be combined with clinical observations, patient history, and epidemiological information. The expected result is Negative.  Fact Sheet for Patients:   EntrepreneurPulse.com.au  Fact Sheet for Healthcare Providers:  IncredibleEmployment.be  This test is no t yet approved or cleared by the [place] FDA and  has been authorized for detection and/or diagnosis of SARS-CoV-2 by FDA under an Emergency Use Authorization (EUA). This EUA will remain  in effect (meaning this test can be used) for the duration of the COVID-19 declaration under Section 564(b)(1) of the Act, 21 U.S.C.section 360bbb-3(b)(1), unless the authorization is terminated  or revoked sooner.       Influenza A by PCR NEGATIVE NEGATIVE Final   Influenza B by PCR NEGATIVE NEGATIVE Final    Comment: (NOTE)  The Xpert Xpress SARS-CoV-2/FLU/RSV plus assay is intended as an aid in the diagnosis of influenza from Nasopharyngeal swab specimens and should not be used as a sole basis for treatment. Nasal washings and aspirates are unacceptable for Xpert Xpress SARS-CoV-2/FLU/RSV testing.  Fact Sheet for Patients: EntrepreneurPulse.com.au  Fact Sheet for Healthcare Providers: IncredibleEmployment.be  This test is not yet approved or cleared by the [place] FDA and has been authorized for detection and/or diagnosis of SARS-CoV-2 by FDA under an Emergency Use Authorization (EUA). This EUA will remain in effect (meaning this test can be used) for the duration of the COVID-19 declaration under Section 564(b)(1) of the Act, 21 U.S.C. section 360bbb-3(b)(1), unless the authorization is terminated or revoked.  Performed at [facility], [place] [address] [place], [place] [address]   Body fluid culture w Gram Stain     Status: None (Preliminary result)   Collection Time: 09/03/21  8:30 AM   Specimen: Pleural Fluid  Result Value Ref Range Status   Specimen Description PLEURAL FLUID  Final   Special Requests LEFT  Final   Gram Stain   Final    WBC PRESENT,BOTH PMN AND MONONUCLEAR NO ORGANISMS  SEEN CYTOSPIN SMEAR    Culture   Final    NO GROWTH 1 DAY Performed at [facility], [place] [address], [place], [place] [address]    Report Status PENDING  Incomplete    RADIOLOGY STUDIES/RESULTS: DG Chest 2 View  Result Date: 09/02/2021 CLINICAL DATA:  Shortness of breath EXAM: CHEST - 2 VIEW COMPARISON:  07/25/2020 FINDINGS: Complete whiteout of the left hemithorax with no aeration demonstrated in the left lung. Changes could represent either or combination of pleural effusion, atelectasis, and/or consolidation. The right lung is clear. Heart size is obscured by the left chest process. Degenerative changes in the spine and shoulders. A loop recorder is present. IMPRESSION: Complete whiteout of the left lung suggesting pleural effusion, atelectasis, and/or consolidation. Electronically Signed   By: Lucienne Capers M.D.   On: 09/02/2021 19:26   CT CHEST WO CONTRAST  Result Date: 09/03/2021 CLINICAL DATA:  Pleural effusion, malignancy suspected. EXAM: CT CHEST WITHOUT CONTRAST TECHNIQUE: Multidetector CT imaging of the chest was performed following the standard protocol without IV contrast. RADIATION DOSE REDUCTION: This exam was performed according to the departmental dose-optimization program which includes automated exposure control, adjustment of the mA and/or kV according to patient size and/or use of iterative reconstruction technique. COMPARISON:  None. FINDINGS: Cardiovascular: No thoracic aortic aneurysm. Aortic atherosclerosis present. Mediastinum/Nodes: Mass within the pre-vascular space measures approximately 2.1 cm (axial series 3, image 21). Additional mass within the pre-vascular space measures 1.2 cm (axial series 3, image 18). 2.1 cm hypodense lesion within the RIGHT thyroid lobe, likely an incidental thyroid cyst. Lungs/Pleura: Mixture of dense consolidations and ground-glass opacities throughout the LEFT lung. Small LEFT pleural effusion. LEFT-sided chest tube in place. 9 mm  nodular density within the medial aspect of the RIGHT lower lobe (series 8, image 88). 6 mm pleural based nodular density at the junction of the RIGHT minor and major fissures (series 8, image 75). Additional pleural based nodule within the anterior aspect of the RIGHT upper lobe measures 8 mm (series 8, image 70). Subsolid nodular density within the RIGHT lung apex measures 8 mm (series 8, image 30). Upper Abdomen: Limited images of the upper abdomen are unremarkable. Musculoskeletal: Large mass within the LEFT breast, incompletely imaged but measuring at least 10 cm greatest dimension. Additional mass  within the lower LEFT axilla measures 2.5 cm. Numerous enlarged lymph nodes within the overlying LEFT axilla. Mixed blastic and lytic changes of the sternum. IMPRESSION: 1. Large irregular mass within the LEFT breast, incompletely imaged but measuring at least 10 cm greatest dimension, almost certainly representing invasive breast cancer. 2. Numerous masses within the LEFT axilla, largest measuring 2.5 cm, almost certainly representing metastatic lymphadenopathy. 3. 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 Superimposed pneumonia not excluded if febrile. 4. At least 2 masses within the pre-vascular mediastinum, largest measuring 2.1 cm, likely additional metastatic lymphadenopathy. One or both could also represent direct extension of the LEFT breast mass given the destructive change/discontinuity of the overlying sternum. 5. Small LEFT pleural effusion. LEFT-sided chest tube in place. 6. Multiple RIGHT-sided pulmonary nodules, suspicious for additional metastatic disease. 7. Blastic and lytic lesions throughout the sternum, compatible with metastatic disease. 8. 2.1 cm hypodense lesion within the RIGHT thyroid lobe. Recommend thyroid US (ref: J Am Coll Radiol. 2015 Feb;12(2): 143-50). Aortic Atherosclerosis (ICD10-I70.0).  Electronically Signed   By: Franki Cabot M.D.   On: 09/03/2021 13:06   DG Chest Port 1 View  Result Date: 09/03/2021 CLINICAL DATA:  Status post chest tube insertion. EXAM: PORTABLE CHEST 1 VIEW COMPARISON:  09/02/2021. FINDINGS: Cardiac enlargement is unchanged. Loop recorder is again noted in the projection of the left side of heart. Interval placement of left lateral basilar chest tube. Significant interval improvement in aeration to the left lung. No pneumothorax identified. Residual hazy opacities throughout the left lung are now visualized. Right lung is clear. IMPRESSION: 1. Significant interval improvement in aeration to the left lung status post left chest tube placement. Residual hazy opacities throughout the left lung. 2. No pneumothorax. Electronically Signed   By: Kerby Moors M.D.   On: 09/03/2021 10:39   ECHOCARDIOGRAM COMPLETE  Result Date: 09/03/2021    ECHOCARDIOGRAM REPORT   Patient Name:   TIANA SIVERTSON Date of Exam: 09/03/2021 Medical Rec #:  [id number]          Height:       62.0 in Accession #:    [id number]         Weight:       150.0 lb Date of Birth:  September 02, 1945          BSA:          1.692 m Patient Age:    64 years           BP:           104/57 mmHg Patient Gender: F                  HR:           79 bpm. Exam Location:  Inpatient Procedure: 2D Echo, Cardiac Doppler and Color Doppler Indications:    Pleural effusion on left [[id number]]  History:        Patient has prior history of Echocardiogram examinations, most                 recent 05/15/2018. Signs/Symptoms:Shortness of Breath.  Sonographer:    Merrie Roof RDCS Referring Phys: [place]  1. Asymmetric septal hypertophy. late peaking mild LVOT obstruction. Anterior leaflet appears prolonged, no sig SAM. Consistent with HOCM. Recommend cardiac MRI if clinically indicated. Left ventricular ejection fraction, by estimation, is 60 to 65%. The left ventricle has normal function. Left ventricular diastolic  parameters  are consistent with Grade I diastolic dysfunction (impaired relaxation).  2. Right ventricular systolic function is normal. The right ventricular size is normal. There is normal pulmonary artery systolic pressure.  3. The mitral valve was not well visualized. No evidence of mitral valve regurgitation.  4. The aortic valve is grossly normal. Aortic valve regurgitation is not visualized.  5. The inferior vena cava is normal in size with greater than 50% respiratory variability, suggesting right atrial pressure of 3 mmHg. Comparison(s): No significant change from prior study. FINDINGS  Left Ventricle: Asymmetric septal hypertophy. late peaking mild LVOT obstruction. Anterior leaflet appears prolonged, no sig SAM. Consistent with HOCM. Recommend cardiac MRI if clinically indicated. Left ventricular ejection fraction, by estimation, is 60 to 65%. The left ventricle has normal function. The left ventricular internal cavity size was normal in size. Left ventricular diastolic parameters are consistent with Grade I diastolic dysfunction (impaired relaxation). Right Ventricle: The right ventricular size is normal. No increase in right ventricular wall thickness. Right ventricular systolic function is normal. There is normal pulmonary artery systolic pressure. The tricuspid regurgitant velocity is 2.81 m/s, and  with an assumed right atrial pressure of 3 mmHg, the estimated right ventricular systolic pressure is 01.0 mmHg. Left Atrium: Left atrial size was normal in size. Right Atrium: Right atrial size was normal in size. Pericardium: There is no evidence of pericardial effusion. Mitral Valve: The mitral valve was not well visualized. No evidence of mitral valve regurgitation. Tricuspid Valve: The tricuspid valve is not well visualized. Tricuspid valve regurgitation is mild. Aortic Valve: The aortic valve is grossly normal. Aortic valve regurgitation is not visualized. Aortic valve mean gradient measures 6.0 mmHg.  Aortic valve peak gradient measures 10.9 mmHg. Aortic valve area, by VTI measures 2.10 cm. Pulmonic Valve: The pulmonic valve was not well visualized. Pulmonic valve regurgitation is trivial. Aorta: The aortic root and ascending aorta are structurally normal, with no evidence of dilitation. Venous: The inferior vena cava is normal in size with greater than 50% respiratory variability, suggesting right atrial pressure of 3 mmHg. IAS/Shunts: The interatrial septum was not well visualized.  LEFT VENTRICLE PLAX 2D LVIDd:         3.40 cm   Diastology LV PW:         1.00 cm   LV e' medial:    5.98 cm/s LV IVS:        1.00 cm   LV E/e' medial:  11.2 LVOT diam:     1.90 cm   LV e' lateral:   6.53 cm/s LV SV:         65        LV E/e' lateral: 10.2 LV SV Index:   38 LVOT Area:     2.84 cm                           3D Volume EF:                          3D EF:        60 %                          LV EDV:       72 ml                          LV  ESV:       29 ml                          LV SV:        43 ml RIGHT VENTRICLE RV Basal diam:  2.70 cm LEFT ATRIUM             Index        RIGHT ATRIUM           Index LA diam:        2.10 cm 1.24 cm/m   RA Area:     11.40 cm LA Vol (A2C):   62.3 ml 36.83 ml/m  RA Volume:   24.90 ml  14.72 ml/m LA Vol (A4C):   35.3 ml 20.87 ml/m LA Biplane Vol: 46.9 ml 27.72 ml/m  AORTIC VALVE AV Area (Vmax):    2.18 cm AV Area (Vmean):   2.05 cm AV Area (VTI):     2.10 cm AV Vmax:           165.00 cm/s AV Vmean:          110.000 cm/s AV VTI:            0.308 m AV Peak Grad:      10.9 mmHg AV Mean Grad:      6.0 mmHg LVOT Vmax:         127.00 cm/s LVOT Vmean:        79.500 cm/s LVOT VTI:          0.228 m LVOT/AV VTI ratio: 0.74  AORTA Ao Root diam: 3.00 cm Ao Asc diam:  2.50 cm MITRAL VALVE               TRICUSPID VALVE MV Area (PHT): 3.72 cm    TR Peak grad:   31.6 mmHg MV Decel Time: 204 msec    TR Vmax:        281.00 cm/s MV E velocity: 66.90 cm/s MV A velocity: 85.20 cm/s  SHUNTS MV E/A  ratio:  0.79        Systemic VTI:  0.23 m                            Systemic Diam: 1.90 cm Phineas Inches Electronically signed by Phineas Inches Signature Date/Time: 09/03/2021/2:49:14 PM    Final    VAS [place] LOWER EXTREMITY VENOUS (DVT)  Result Date: 09/03/2021  Lower Venous DVT Study Patient Name:  GIOVANNINA MUN  Date of Exam:   09/03/2021 Medical Rec #: [id number]           Accession #:    [id number] Date of Birth: 11/21/1945           Patient Gender: F Patient Age:   56 years Exam Location:  [facility] [place] Procedure:      VAS [place] LOWER EXTREMITY VENOUS (DVT) Referring Phys: Oren Binet --------------------------------------------------------------------------------  Indications: Swelling, Edema, and elevated ddimer.  Comparison Study: no prior Performing Technologist: Archie Patten RVS  Examination Guidelines: A complete evaluation includes B-mode imaging, spectral Doppler, color Doppler, and power Doppler as needed of all accessible portions of each vessel. Bilateral testing is considered an integral part of a complete examination. Limited examinations for reoccurring indications may be performed as noted. The reflux portion of the exam is performed with the patient in reverse Trendelenburg.  +---------+---------------+---------+-----------+----------+--------------+  RIGHT     Compressibility Phasicity  Spontaneity Properties Thrombus Aging  +---------+---------------+---------+-----------+----------+--------------+  CFV       Full            Yes       Yes                                    +---------+---------------+---------+-----------+----------+--------------+  SFJ       Full                                                             +---------+---------------+---------+-----------+----------+--------------+  FV Prox   Full                                                             +---------+---------------+---------+-----------+----------+--------------+  FV Mid    Full                                                              +---------+---------------+---------+-----------+----------+--------------+  FV Distal Full                                                             +---------+---------------+---------+-----------+----------+--------------+  PFV       Full                                                             +---------+---------------+---------+-----------+----------+--------------+  POP       Full            Yes       Yes                                    +---------+---------------+---------+-----------+----------+--------------+  PTV       Full                                                             +---------+---------------+---------+-----------+----------+--------------+  PERO      Full                                                             +---------+---------------+---------+-----------+----------+--------------+   +---------+---------------+---------+-----------+----------+--------------+  LEFT      Compressibility Phasicity Spontaneity Properties Thrombus Aging  +---------+---------------+---------+-----------+----------+--------------+  CFV       Full            Yes       Yes                                    +---------+---------------+---------+-----------+----------+--------------+  SFJ       Full                                                             +---------+---------------+---------+-----------+----------+--------------+  FV Prox   Full                                                             +---------+---------------+---------+-----------+----------+--------------+  FV Mid    Full                                                             +---------+---------------+---------+-----------+----------+--------------+  FV Distal Full                                                             +---------+---------------+---------+-----------+----------+--------------+  PFV       Full                                                              +---------+---------------+---------+-----------+----------+--------------+  POP       Full            Yes       Yes                                    +---------+---------------+---------+-----------+----------+--------------+  PTV       Full                                                             +---------+---------------+---------+-----------+----------+--------------+  PERO      Full                                                             +---------+---------------+---------+-----------+----------+--------------+  Summary: BILATERAL: - No evidence of deep vein thrombosis seen in the lower extremities, bilaterally. -No evidence of popliteal cyst, bilaterally.   *See table(s) above for measurements and observations.    Preliminary      LOS: 1 day   Oren Binet, MD  [facility]    To contact the attending provider between 7A-7P or the covering provider during after hours 7P-7A, please log into the web site [email or web address] and access using universal [place] password for that web site. If you do not have the password, please call the hospital operator.  09/04/2021, 2:53 PM

## 2021-09-04 NOTE — Progress Notes (Signed)
Oncology Discharge Planning Admission Note  [facility] Address: [place], [place], [place] [address] Hours of Operation:  8am - 5pm, Monday - Friday  Clinic Contact Information:  [phone]  Oncology Care Team: Medical Oncologist:    Oncology provider Myrtha Mantis, NP is aware of this hospital admission dated 09/03/21 and has assessed patient at bedside. The cancer center will follow [facility] inpatient care to assist with discharge planning as indicated by the oncologist.  We will arrange follow up with oncology prior to discharge.   Disclaimer:  This [place] note does not imply a formal consult request has been made by the admitting attending for this admission or there will be an inpatient consult completed by oncology.  Please request oncology consults as per standard process as indicated.

## 2021-09-04 NOTE — Evaluation (Addendum)
Occupational Therapy Evaluation Patient Details Name: Heather Rogers MRN: [id number] DOB: 08/28/1945 Today's Date: 09/04/2021   History of Present Illness 76 y.o. female presents to [facility] hospital on 09/02/2021 with SOB, found to have a large left-sided pleural effusion. Pt underwent chest tube placement on 09/03/2021. PMH includes CVA, DMII, HTN.   Clinical Impression   Pt presents with decline in function and safety with ADLs and ADL mobility with impaired strength, balance and endurance.  Pt limited by reports of SOB, desaturating during ADL mobility and LB ADLs. PTA pt lived at home with her grandson and was Ind with ADLs/selfcare, home mgt and mobility with no AD. O2 SATs down to 84-87% with in room activity and pt instructed on deep, pursed lip breathing to recover. Pt currently required min - min guard A with LB ADLs, min guard A with toileting and min guard A - Sup with mobility HHA. Pt would benefit from acute OT services to address impairments to maximize level of function and safety     Recommendations for follow up therapy are one component of a multi-disciplinary discharge planning process, led by the attending physician.  Recommendations may be updated based on patient status, additional functional criteria and insurance authorization.   Follow Up Recommendations  No OT follow up    Assistance Recommended at Discharge Intermittent Supervision/Assistance  Patient can return home with the following A little help with bathing/dressing/bathroom;A little help with walking and/or transfers;Assistance with cooking/housework    Functional Status Assessment  Patient has had a recent decline in their functional status and demonstrates the ability to make significant improvements in function in a reasonable and predictable amount of time.  Equipment Recommendations  Tub/shower bench    Recommendations for Other Services       Precautions / Restrictions Precautions Precautions:  Fall Precaution Comments: monitor SpO2 Restrictions Weight Bearing Restrictions: No      Mobility Bed Mobility               General bed mobility comments: pt in recliner    Transfers Overall transfer level: Needs assistance Equipment used: None Transfers: Sit to/from Stand Sit to Stand: Min guard, Supervision           General transfer comment: min guard A from chair and bed, Sup from [facility]      Balance Overall balance assessment: Needs assistance Sitting-balance support: No upper extremity supported, Feet supported Sitting balance-Leahy Scale: Good     Standing balance support: No upper extremity supported, During functional activity, Single extremity supported Standing balance-Leahy Scale: Fair                             ADL either performed or assessed with clinical judgement   ADL Overall ADL's : Needs assistance/impaired Eating/Feeding: Independent   Grooming: Wash/dry hands;Wash/dry face;Min guard;Standing   Upper Body Bathing: Set up;Supervision/ safety;Sitting   Lower Body Bathing: Minimal assistance;Min guard   Upper Body Dressing : Set up;Supervision/safety;Sitting   Lower Body Dressing: Minimal assistance;Min guard   Toilet Transfer: Min guard;Supervision/safety;Ambulation;Cueing for safety;BSC/3in1   Toileting- Water quality scientist and Hygiene: Min guard;Sit to/from stand       Functional mobility during ADLs: Min guard;Supervision/safety;+2 for safety/equipment General ADL Comments: pt limited by Poor activity tolerance, SOB with minimal exertion     Vision Baseline Vision/History: 1 Wears glasses Ability to See in Adequate Light: 0 Adequate Patient Visual Report: No change from baseline  Perception     Praxis      Pertinent Vitals/Pain Pain Assessment Pain Assessment: No/denies pain     Hand Dominance Right   Extremity/Trunk Assessment Upper Extremity Assessment Upper Extremity Assessment: Generalized  weakness   Lower Extremity Assessment Lower Extremity Assessment: Generalized weakness   Cervical / Trunk Assessment Cervical / Trunk Assessment: Normal   Communication Communication Communication: No difficulties   Cognition Arousal/Alertness: Awake/alert Behavior During Therapy: WFL for tasks assessed/performed Overall Cognitive Status: Within Functional Limits for tasks assessed                                       General Comments  pt on 1L [place] at rest upon PT arrival, PT weans to room air, pt desats to 83% with ambulation. Pt recovers when resting on 1L [place].    Exercises     Shoulder Instructions      Home Living Family/patient expects to be discharged to:: Private residence Living Arrangements: Other relatives (grandson lives with her) Available Help at Discharge: Family;Available PRN/intermittently Type of Home: House Home Access: Ramped entrance     Home Layout: One level     Bathroom Shower/Tub: Teacher, early years/pre: Standard Bathroom Accessibility: Yes   Home Equipment: BSC/3in1          Prior Functioning/Environment Prior Level of Function : Independent/Modified Independent                        OT Problem List: Decreased activity tolerance;Decreased knowledge of use of DME or AE;Decreased strength;Impaired balance (sitting and/or standing)      OT Treatment/Interventions:      OT Goals(Current goals can be found in the care plan section) Acute Rehab OT Goals Patient Stated Goal: go home OT Goal Formulation: With patient Time For Goal Achievement: 09/18/21 Potential to Achieve Goals: Good ADL Goals Pt Will Perform Grooming: with supervision;with set-up;with modified independence Pt Will Perform Lower Body Bathing: with min guard assist;with supervision;with modified independence Pt Will Perform Lower Body Dressing: with min guard assist;with supervision;with modified independence Pt Will Transfer to  Toilet: with supervision;with modified independence Pt Will Perform Toileting - Clothing Manipulation and hygiene: with supervision;with modified independence Pt Will Perform Tub/Shower Transfer: with min guard assist;with supervision;ambulating  OT Frequency: Min 2X/week    Co-evaluation              AM-PAC OT "6 Clicks" Daily Activity     Outcome Measure Help from another person eating meals?: None Help from another person taking care of personal grooming?: A Little Help from another person toileting, which includes using toliet, bedpan, or urinal?: A Little Help from another person bathing (including washing, rinsing, drying)?: A Little Help from another person to put on and taking off regular upper body clothing?: None Help from another person to put on and taking off regular lower body clothing?: A Little 6 Click Score: 20   End of Session Equipment Utilized During Treatment: Gait belt  Activity Tolerance: Patient limited by fatigue Patient left: in chair;with call bell/phone within reach;with chair alarm set  OT Visit Diagnosis: Other abnormalities of gait and mobility (R26.89);Muscle weakness (generalized) (M62.81)                Time: 9518-8416 OT Time Calculation (min): 23 min Charges:  OT General Charges $OT Visit: 1 Visit OT Evaluation $OT Eval Moderate  Complexity: 1 Mod OT Treatments $Self Care/Home Management : 8-22 mins    Emmit Alexanders [facility] 09/04/2021, 1:29 PM

## 2021-09-04 NOTE — Progress Notes (Signed)
° °  NAME:  Heather Rogers, MRN:  [id number], DOB:  November 11, 1945, LOS: 1 ADMISSION DATE:  09/02/2021, CONSULTATION DATE:  09/03/21 REFERRING MD:  Sloan Leiter, CHIEF COMPLAINT:  sob   History of Present Illness:  76 year old woman with distant hx of smoking presenting with 1 week of worsening SOB, initially just with exertion now at rest.  Imaging in ER shows complete opacification of L hemithorax.  PCCM consulted for management.  Patient denies fevers, chills, pleurisy, cough, trauma, prior chest surgeries.  Does have 1-2 day hx of nausea and dry heaving.  Pertinent  Medical History  DM2 HTN CVA  Significant Hospital Events: Including procedures, antibiotic start and stop dates in addition to other pertinent events   09/03/21 consulted 2/6 Documented output from CT since insertion is 2,525  Interim History / Subjective:  No acute complaints this am Is requesting breakfast   Objective   Blood pressure (!) 88/41, pulse 74, temperature 98.3 F (36.8 C), resp. rate 19, height 5\' 2"  (1.575 m), weight 64.8 kg, SpO2 91 %.        Intake/Output Summary (Last 24 hours) at 09/04/2021 0837 Last data filed at 09/04/2021 7425 Gross per 24 hour  Intake 487 ml  Output 2735 ml  Net -2248 ml    Filed Weights   09/02/21 1827 09/04/21 0555  Weight: 68 kg 64.8 kg    Examination: General: Thin elderly female seen lying in bed in NAD  HEENT: /AT, MM pink/moist, PERRL,  Neuro: Alert and oriented x3, non-focal  CV: s1s2 regular rate and rhythm, no murmur, rubs, or gallops,  PULM:  Clear to ascultation, diminished left base, no increased work of breathing  GI: soft, bowel sounds active in all 4 quadrants, non-tender, non-distended, tolerating oral diet  Extremities: warm/dry, no edema  Skin: no rashes or lesions  Resolved Hospital Problem list   N/A  Assessment & Plan:  Abnormal CXR - large effusion on [place], no infectious s/s.  Hx of smoking in distant past.  Concerned for malignancy.    P: Continue routine chest tube care  Monitor chest tube output  Follow cytology  363ml in current drainage system Mobilize as able   Pulmonary hygiene   Best Practice (right click and "Reselect all SmartList Selections" daily)  Per primary   Critical care: NA  Jernie Schutt D. Kenton Kingfisher, NP-C [facility] Personal contact information can be found on Amion  09/04/2021, 8:38 AM

## 2021-09-04 NOTE — Progress Notes (Addendum)
Output from chest tube overngiht; 25 ml or 275 total. Day shift was 250.

## 2021-09-04 NOTE — Progress Notes (Signed)
Chest tube clamped (stop cock off to patient) and chest tube flushes discontinued per MD Tamala Julian.

## 2021-09-04 NOTE — Assessment & Plan Note (Signed)
Daughter acknowledges some cognitive dysfunction at baseline-after CVA-needs formal neuropsychological testing in the outpatient setting.  May have some element of dementia/vascular dementia at baseline.

## 2021-09-04 NOTE — Progress Notes (Signed)
°  Transition of Care [facility]) Screening Note   Patient Details  Name: Heather Rogers Date of Birth: 10/26/1945   Transition of Care [facility]) CM/SW Contact:    Cyndi Bender, RN Phone Number: 09/04/2021, 8:26 AM    Transition of Care Department [facility]) has reviewed patient and no TOC needs have been identified at this time. We will continue to monitor patient advancement through interdisciplinary progression rounds. If new patient transition needs arise, please place a TOC consult.

## 2021-09-04 NOTE — Consult Note (Addendum)
[place]  [phone] [phone]   MEDICAL ONCOLOGY - INITIAL CONSULTATION  Referral MD: Dr. Oren Binet  Reason for Referral: Left breast mass, bone lesions, pulmonary nodules, pleural effusion  HPI: [name] is a 76 year old female with a past medical history significant for diabetes mellitus, hypertension, history of prior CVA with right-sided hemiparesis.  She presented to the emergency department with a 2-week history of dyspnea.  She has also had a poor appetite and has lost weight.  Admission lab work showed a hemoglobin of 9.6, MCV 71.9, platelets 489,000, BUN 70, creatinine 1.61.  Chest x-ray showed complete whiteout of the left lung suggesting a pleural effusion, atelectasis, and/or consolidation.  Chest tube inserted by PCCM.  Pleural fluid has been sent for cytology which is currently pending.  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   No family at the bedside.  The patient reports that she has known about her left breast mass for at least a few years.  States that she did not get checked out because she figured she was in and out of the hospital in the medical personnel were checking on it.  Area has grown over the years.  She is not having any pain to this area.  She has not had a mammogram in "many years."  Reports improvement in her  breathing.  She has been able to ambulate around her hospital room but not long distances.  She is not having any fevers, chills, headaches, dizziness, chest pain, abdominal pain, nausea, vomiting.  Reports that she had a stroke about 2 years ago but denies any residual deficits with the exception of difficulty remembering things. The patient lives with her grandson.  She states that she is independent with ADLs and IADLs.  States that she has been driving herself. She has a remote history of alcohol and tobacco use.  Denies family history of malignancy.  Medical oncology was asked to see the patient make recommendations regarding her abnormal CT scan findings.  Past Medical History:  Diagnosis Date   Diabetes ([place])    Hypertension    Pneumonia due to COVID-19 virus 07/25/2020   Stroke (cerebrum) [facility])    Stroke ([place]) 05/15/2018  :   Past Surgical History:  Procedure Laterality Date   EYE SURGERY Left 04/06/2020   Implant placed   EYE SURGERY Right 03/30/2020   Implant placed    hysterectomy     LOOP RECORDER INSERTION N/A 05/20/2018   Procedure: LOOP RECORDER INSERTION;  Surgeon: Thompson Grayer, MD;  Location: [place] [facility];  Service: Cardiovascular;  Laterality: N/A;   TEE WITHOUT CARDIOVERSION N/A 05/20/2018   Procedure: TRANSESOPHAGEAL ECHOCARDIOGRAM (TEE);  Surgeon: Sueanne Margarita, MD;  Location: [facility] ENDOSCOPY;  Service: Cardiovascular;  Laterality: N/A;  :   Current Facility-Administered Medications  Medication Dose Route Frequency Provider Last Rate Last Admin   acetaminophen (TYLENOL) tablet 650 mg  650 mg Oral Q6H PRN Kristopher Oppenheim, DO  Or   acetaminophen (TYLENOL) suppository 650 mg  650 mg Rectal Q6H PRN Kristopher Oppenheim, DO       albuterol (PROVENTIL) (2.5 MG/3ML) 0.083% nebulizer solution 2.5 mg  2.5 mg Nebulization Q2H PRN Kristopher Oppenheim, DO       allopurinol (ZYLOPRIM) tablet 100 mg  100 mg Oral Daily Jonetta Osgood, MD   100 mg at 09/04/21 0944   amLODipine (NORVASC)  tablet 10 mg  10 mg Oral Daily Kristopher Oppenheim, DO   10 mg at 09/03/21 0932   aspirin EC tablet 81 mg  81 mg Oral Daily Jonetta Osgood, MD   81 mg at 09/04/21 0944   atorvastatin (LIPITOR) tablet 80 mg  80 mg Oral QHS Kristopher Oppenheim, DO   80 mg at 09/03/21 2106   carvedilol (COREG) tablet 25 mg  25 mg Oral BID WC Kristopher Oppenheim, DO   25 mg at 09/03/21 1624   gabapentin (NEURONTIN) capsule 100 mg  100 mg Oral TID Kristopher Oppenheim, DO   100 mg at 09/04/21 0944   heparin injection 5,000 Units  5,000 Units Subcutaneous Q8H Kristopher Oppenheim, DO   5,000 Units at 09/04/21 1941   insulin aspart (novoLOG) injection 0-5 Units  0-5 Units Subcutaneous QHS Kristopher Oppenheim, DO       insulin aspart (novoLOG) injection 0-9 Units  0-9 Units Subcutaneous TID WC Kristopher Oppenheim, DO   1 Units at 09/04/21 0950   melatonin tablet 10 mg  10 mg Oral QHS PRN Kristopher Oppenheim, DO       morphine (PF) 2 MG/ML injection 1 mg  1 mg Intravenous Q4H PRN Jonetta Osgood, MD       ondansetron [facility]) tablet 4 mg  4 mg Oral Q6H PRN Kristopher Oppenheim, DO       Or   ondansetron [facility]) injection 4 mg  4 mg Intravenous Q6H PRN Kristopher Oppenheim, DO   4 mg at 09/02/21 2153   sodium chloride flush (NS) 0.9 % injection 10 mL  10 mL Other Q8H Candee Furbish, MD   10 mL at 09/04/21 0200      Allergies  Allergen Reactions   Sulfa Antibiotics     Itching   :   Family History  Problem Relation Age of Onset   Hypertension Mother    Stroke Mother    Hypertension Father   :   Social History   Socioeconomic History   Marital status: Divorced    Spouse name: Not on file   Number of children: Not on file   Years of education: Not on file   Highest education level: Not on file  Occupational History   Not on file  Tobacco Use   Smoking status: Former   Smokeless tobacco: Never  Substance and Sexual Activity   Alcohol use: Never   Drug use: Never   Sexual activity: Not Currently  Other Topics Concern   Not on file  Social History Narrative   Lives in  [place].   Social Determinants of Health   Financial Resource Strain: Not on file  Food Insecurity: Not on file  Transportation Needs: Not on file  Physical Activity: Not on file  Stress: Not on file  Social Connections: Not on file  Intimate Partner Violence: Not on file  :  Review of Systems: A comprehensive 14 point review of systems was negative except as noted in the HPI.  Exam: Patient Vitals for the past 24 hrs:  BP Temp Temp src  Pulse Resp SpO2 Weight  09/04/21 1143 -- 98 F (36.7 C) -- -- -- -- --  09/04/21 1139 (!) 108/53 98 F (36.7 C) Oral 88 (!) 25 (!) 89 % --  09/04/21 0950 119/62 -- -- 84 (!) 23 94 % --  09/04/21 0809 -- 98.3 F (36.8 C) -- -- -- -- --  09/04/21 0800 (!) 105/53 -- -- 75 (!) 30 94 % --  09/04/21 0555 -- -- -- -- -- -- 64.8 kg  09/04/21 0400 (!) 88/41 98.6 F (37 C) Oral 74 19 91 % --  09/04/21 0157 (!) 94/51 -- -- -- -- -- --  09/04/21 0138 (!) 89/42 98.3 F (36.8 C) Oral 72 19 92 % --  09/03/21 2346 93/74 98.6 F (37 C) Oral 73 19 91 % --  09/03/21 2111 94/66 -- -- 79 (!) 24 92 % --  09/03/21 1930 107/61 98.3 F (36.8 C) Oral 76 (!) 22 93 % --  09/03/21 1640 (!) 106/56 98.1 F (36.7 C) Oral 75 20 91 % --    General: Awake and alert, no distress Eyes:  no scleral icterus.   ENT:  There were no oropharyngeal lesions.   Lymphatics: Palpable left axillary lymphadenopathy. Breasts: Right breast without masses, left breast with a large, hard and, palpable mass.  Small amount of excoriation in the left outer aspect of the breast, but no drainage. Respiratory: Diminished left base. Cardiovascular:  Regular rate and rhythm, S1/S2, without murmur, rub or gallop.  There was no pedal edema.   GI:  abdomen was soft, flat, nontender, nondistended, without organomegaly.   Skin exam was without echymosis, petichae.   Neuro exam was nonfocal. Patient was alert and oriented.     Lab Results  Component Value Date   WBC 6.1 09/04/2021   HGB 8.8  (L) 09/04/2021   HCT 28.6 (L) 09/04/2021   PLT 392 09/04/2021   GLUCOSE 116 (H) 09/04/2021   CHOL 115 05/24/2021   TRIG 105 05/24/2021   HDL 38 (L) 05/24/2021   LDLCALC 57 05/24/2021   ALT 61 (H) 09/04/2021   AST 64 (H) 09/04/2021   NA 138 09/04/2021   K 4.1 09/04/2021   CL 106 09/04/2021   CREATININE 1.78 (H) 09/04/2021   BUN 60 (H) 09/04/2021   CO2 21 (L) 09/04/2021    DG Chest 2 View  Result Date: 09/02/2021 CLINICAL DATA:  Shortness of breath EXAM: CHEST - 2 VIEW COMPARISON:  07/25/2020 FINDINGS: Complete whiteout of the left hemithorax with no aeration demonstrated in the left lung. Changes could represent either or combination of pleural effusion, atelectasis, and/or consolidation. The right lung is clear. Heart size is obscured by the left chest process. Degenerative changes in the spine and shoulders. A loop recorder is present. IMPRESSION: Complete whiteout of the left lung suggesting pleural effusion, atelectasis, and/or consolidation. Electronically Signed   By: Lucienne Capers M.D.   On: 09/02/2021 19:26   CT CHEST WO CONTRAST  Result Date: 09/03/2021 CLINICAL DATA:  Pleural effusion, malignancy suspected. EXAM: CT CHEST WITHOUT CONTRAST TECHNIQUE: Multidetector CT imaging of the chest was performed following the standard protocol without IV contrast. RADIATION DOSE REDUCTION: This exam was performed according to the departmental dose-optimization program which includes automated exposure control, adjustment of the mA and/or kV according to patient size and/or use of iterative reconstruction technique. COMPARISON:  None. FINDINGS: Cardiovascular: No thoracic aortic aneurysm. Aortic atherosclerosis present. Mediastinum/Nodes: Mass within the pre-vascular space measures approximately 2.1 cm (axial series 3,  image 21). Additional mass within the pre-vascular space measures 1.2 cm (axial series 3, image 18). 2.1 cm hypodense lesion within the RIGHT thyroid lobe, likely an incidental  thyroid cyst. Lungs/Pleura: Mixture of dense consolidations and ground-glass opacities throughout the LEFT lung. Small LEFT pleural effusion. LEFT-sided chest tube in place. 9 mm nodular density within the medial aspect of the RIGHT lower lobe (series 8, image 88). 6 mm pleural based nodular density at the junction of the RIGHT minor and major fissures (series 8, image 75). Additional pleural based nodule within the anterior aspect of the RIGHT upper lobe measures 8 mm (series 8, image 70). Subsolid nodular density within the RIGHT lung apex measures 8 mm (series 8, image 30). Upper Abdomen: Limited images of the upper abdomen are unremarkable. Musculoskeletal: Large mass within the LEFT breast, incompletely imaged but measuring at least 10 cm greatest dimension. Additional mass within the lower LEFT axilla measures 2.5 cm. Numerous enlarged lymph nodes within the overlying LEFT axilla. Mixed blastic and lytic changes of the sternum. IMPRESSION: 1. Large irregular mass within the LEFT breast, incompletely imaged but measuring at least 10 cm greatest dimension, almost certainly representing invasive breast cancer. 2. Numerous masses within the LEFT axilla, largest measuring 2.5 cm, almost certainly representing metastatic lymphadenopathy. 3. 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 Superimposed pneumonia not excluded if febrile. 4. At least 2 masses within the pre-vascular mediastinum, largest measuring 2.1 cm, likely additional metastatic lymphadenopathy. One or both could also represent direct extension of the LEFT breast mass given the destructive change/discontinuity of the overlying sternum. 5. Small LEFT pleural effusion. LEFT-sided chest tube in place. 6. Multiple RIGHT-sided pulmonary nodules, suspicious for additional metastatic disease. 7. Blastic and lytic lesions throughout the sternum, compatible with  metastatic disease. 8. 2.1 cm hypodense lesion within the RIGHT thyroid lobe. Recommend thyroid US (ref: J Am Coll Radiol. 2015 Feb;12(2): 143-50). Aortic Atherosclerosis (ICD10-I70.0). Electronically Signed   By: Franki Cabot M.D.   On: 09/03/2021 13:06   DG Chest Port 1 View  Result Date: 09/03/2021 CLINICAL DATA:  Status post chest tube insertion. EXAM: PORTABLE CHEST 1 VIEW COMPARISON:  09/02/2021. FINDINGS: Cardiac enlargement is unchanged. Loop recorder is again noted in the projection of the left side of heart. Interval placement of left lateral basilar chest tube. Significant interval improvement in aeration to the left lung. No pneumothorax identified. Residual hazy opacities throughout the left lung are now visualized. Right lung is clear. IMPRESSION: 1. Significant interval improvement in aeration to the left lung status post left chest tube placement. Residual hazy opacities throughout the left lung. 2. No pneumothorax. Electronically Signed   By: Kerby Moors M.D.   On: 09/03/2021 10:39   ECHOCARDIOGRAM COMPLETE  Result Date: 09/03/2021    ECHOCARDIOGRAM REPORT   Patient Name:   Heather Rogers Date of Exam: 09/03/2021 Medical Rec #:  [id number]          Height:       62.0 in Accession #:    [id number]         Weight:       150.0 lb Date of Birth:  Jun 25, 1946          BSA:          1.692 m Patient Age:    63 years           BP:           104/57 mmHg  Patient Gender: F                  HR:           79 bpm. Exam Location:  Inpatient Procedure: 2D Echo, Cardiac Doppler and Color Doppler Indications:    Pleural effusion on left [[id number]]  History:        Patient has prior history of Echocardiogram examinations, most                 recent 05/15/2018. Signs/Symptoms:Shortness of Breath.  Sonographer:    Merrie Roof RDCS Referring Phys: [facility]  1. Asymmetric septal hypertophy. late peaking mild LVOT obstruction. Anterior leaflet appears prolonged, no sig SAM. Consistent with HOCM.  Recommend cardiac MRI if clinically indicated. Left ventricular ejection fraction, by estimation, is 60 to 65%. The left ventricle has normal function. Left ventricular diastolic parameters are consistent with Grade I diastolic dysfunction (impaired relaxation).  2. Right ventricular systolic function is normal. The right ventricular size is normal. There is normal pulmonary artery systolic pressure.  3. The mitral valve was not well visualized. No evidence of mitral valve regurgitation.  4. The aortic valve is grossly normal. Aortic valve regurgitation is not visualized.  5. The inferior vena cava is normal in size with greater than 50% respiratory variability, suggesting right atrial pressure of 3 mmHg. Comparison(s): No significant change from prior study. FINDINGS  Left Ventricle: Asymmetric septal hypertophy. late peaking mild LVOT obstruction. Anterior leaflet appears prolonged, no sig SAM. Consistent with HOCM. Recommend cardiac MRI if clinically indicated. Left ventricular ejection fraction, by estimation, is 60 to 65%. The left ventricle has normal function. The left ventricular internal cavity size was normal in size. Left ventricular diastolic parameters are consistent with Grade I diastolic dysfunction (impaired relaxation). Right Ventricle: The right ventricular size is normal. No increase in right ventricular wall thickness. Right ventricular systolic function is normal. There is normal pulmonary artery systolic pressure. The tricuspid regurgitant velocity is 2.81 m/s, and  with an assumed right atrial pressure of 3 mmHg, the estimated right ventricular systolic pressure is 69.6 mmHg. Left Atrium: Left atrial size was normal in size. Right Atrium: Right atrial size was normal in size. Pericardium: There is no evidence of pericardial effusion. Mitral Valve: The mitral valve was not well visualized. No evidence of mitral valve regurgitation. Tricuspid Valve: The tricuspid valve is not well visualized.  Tricuspid valve regurgitation is mild. Aortic Valve: The aortic valve is grossly normal. Aortic valve regurgitation is not visualized. Aortic valve mean gradient measures 6.0 mmHg. Aortic valve peak gradient measures 10.9 mmHg. Aortic valve area, by VTI measures 2.10 cm. Pulmonic Valve: The pulmonic valve was not well visualized. Pulmonic valve regurgitation is trivial. Aorta: The aortic root and ascending aorta are structurally normal, with no evidence of dilitation. Venous: The inferior vena cava is normal in size with greater than 50% respiratory variability, suggesting right atrial pressure of 3 mmHg. IAS/Shunts: The interatrial septum was not well visualized.  LEFT VENTRICLE PLAX 2D LVIDd:         3.40 cm   Diastology LV PW:         1.00 cm   LV e' medial:    5.98 cm/s LV IVS:        1.00 cm   LV E/e' medial:  11.2 LVOT diam:     1.90 cm   LV e' lateral:   6.53 cm/s LV SV:  65        LV E/e' lateral: 10.2 LV SV Index:   38 LVOT Area:     2.84 cm                           3D Volume EF:                          3D EF:        60 %                          LV EDV:       72 ml                          LV ESV:       29 ml                          LV SV:        43 ml RIGHT VENTRICLE RV Basal diam:  2.70 cm LEFT ATRIUM             Index        RIGHT ATRIUM           Index LA diam:        2.10 cm 1.24 cm/m   RA Area:     11.40 cm LA Vol (A2C):   62.3 ml 36.83 ml/m  RA Volume:   24.90 ml  14.72 ml/m LA Vol (A4C):   35.3 ml 20.87 ml/m LA Biplane Vol: 46.9 ml 27.72 ml/m  AORTIC VALVE AV Area (Vmax):    2.18 cm AV Area (Vmean):   2.05 cm AV Area (VTI):     2.10 cm AV Vmax:           165.00 cm/s AV Vmean:          110.000 cm/s AV VTI:            0.308 m AV Peak Grad:      10.9 mmHg AV Mean Grad:      6.0 mmHg LVOT Vmax:         127.00 cm/s LVOT Vmean:        79.500 cm/s LVOT VTI:          0.228 m LVOT/AV VTI ratio: 0.74  AORTA Ao Root diam: 3.00 cm Ao Asc diam:  2.50 cm MITRAL VALVE               TRICUSPID  VALVE MV Area (PHT): 3.72 cm    TR Peak grad:   31.6 mmHg MV Decel Time: 204 msec    TR Vmax:        281.00 cm/s MV E velocity: 66.90 cm/s MV A velocity: 85.20 cm/s  SHUNTS MV E/A ratio:  0.79        Systemic VTI:  0.23 m                            Systemic Diam: 1.90 cm Phineas Inches Electronically signed by Phineas Inches Signature Date/Time: 09/03/2021/2:49:14 PM    Final    VAS [place] LOWER EXTREMITY VENOUS (DVT)  Result Date: 09/03/2021  Lower Venous DVT Study Patient Name:  Heather Rogers  Date of Exam:   09/03/2021 Medical  Rec #: [id number]           Accession #:    [id number] Date of Birth: [birth year]           Patient Gender: F Patient Age:   58 years Exam Location:  [facility] Procedure:      VAS [place] LOWER EXTREMITY VENOUS (DVT) Referring Phys: Oren Binet --------------------------------------------------------------------------------  Indications: Swelling, Edema, and elevated ddimer.  Comparison Study: no prior Performing Technologist: Archie Patten RVS  Examination Guidelines: A complete evaluation includes B-mode imaging, spectral Doppler, color Doppler, and power Doppler as needed of all accessible portions of each vessel. Bilateral testing is considered an integral part of a complete examination. Limited examinations for reoccurring indications may be performed as noted. The reflux portion of the exam is performed with the patient in reverse Trendelenburg.  +---------+---------------+---------+-----------+----------+--------------+  RIGHT     Compressibility Phasicity Spontaneity Properties Thrombus Aging  +---------+---------------+---------+-----------+----------+--------------+  CFV       Full            Yes       Yes                                    +---------+---------------+---------+-----------+----------+--------------+  SFJ       Full                                                             +---------+---------------+---------+-----------+----------+--------------+  FV Prox    Full                                                             +---------+---------------+---------+-----------+----------+--------------+  FV Mid    Full                                                             +---------+---------------+---------+-----------+----------+--------------+  FV Distal Full                                                             +---------+---------------+---------+-----------+----------+--------------+  PFV       Full                                                             +---------+---------------+---------+-----------+----------+--------------+  POP       Full            Yes       Yes                                    +---------+---------------+---------+-----------+----------+--------------+  PTV       Full                                                             +---------+---------------+---------+-----------+----------+--------------+  PERO      Full                                                             +---------+---------------+---------+-----------+----------+--------------+   +---------+---------------+---------+-----------+----------+--------------+  LEFT      Compressibility Phasicity Spontaneity Properties Thrombus Aging  +---------+---------------+---------+-----------+----------+--------------+  CFV       Full            Yes       Yes                                    +---------+---------------+---------+-----------+----------+--------------+  SFJ       Full                                                             +---------+---------------+---------+-----------+----------+--------------+  FV Prox   Full                                                             +---------+---------------+---------+-----------+----------+--------------+  FV Mid    Full                                                             +---------+---------------+---------+-----------+----------+--------------+  FV Distal Full                                                              +---------+---------------+---------+-----------+----------+--------------+  PFV       Full                                                             +---------+---------------+---------+-----------+----------+--------------+  POP       Full            Yes       Yes                                    +---------+---------------+---------+-----------+----------+--------------+  PTV       Full                                                             +---------+---------------+---------+-----------+----------+--------------+  PERO      Full                                                             +---------+---------------+---------+-----------+----------+--------------+     Summary: BILATERAL: - No evidence of deep vein thrombosis seen in the lower extremities, bilaterally. -No evidence of popliteal cyst, bilaterally.   *See table(s) above for measurements and observations.    Preliminary      DG Chest 2 View  Result Date: 09/02/2021 CLINICAL DATA:  Shortness of breath EXAM: CHEST - 2 VIEW COMPARISON:  07/25/2020 FINDINGS: Complete whiteout of the left hemithorax with no aeration demonstrated in the left lung. Changes could represent either or combination of pleural effusion, atelectasis, and/or consolidation. The right lung is clear. Heart size is obscured by the left chest process. Degenerative changes in the spine and shoulders. A loop recorder is present. IMPRESSION: Complete whiteout of the left lung suggesting pleural effusion, atelectasis, and/or consolidation. Electronically Signed   By: Lucienne Capers M.D.   On: 09/02/2021 19:26   CT CHEST WO CONTRAST  Result Date: 09/03/2021 CLINICAL DATA:  Pleural effusion, malignancy suspected. EXAM: CT CHEST WITHOUT CONTRAST TECHNIQUE: Multidetector CT imaging of the chest was performed following the standard protocol without IV contrast. RADIATION DOSE REDUCTION: This exam was performed according to the departmental dose-optimization  program which includes automated exposure control, adjustment of the mA and/or kV according to patient size and/or use of iterative reconstruction technique. COMPARISON:  None. FINDINGS: Cardiovascular: No thoracic aortic aneurysm. Aortic atherosclerosis present. Mediastinum/Nodes: Mass within the pre-vascular space measures approximately 2.1 cm (axial series 3, image 21). Additional mass within the pre-vascular space measures 1.2 cm (axial series 3, image 18). 2.1 cm hypodense lesion within the RIGHT thyroid lobe, likely an incidental thyroid cyst. Lungs/Pleura: Mixture of dense consolidations and ground-glass opacities throughout the LEFT lung. Small LEFT pleural effusion. LEFT-sided chest tube in place. 9 mm nodular density within the medial aspect of the RIGHT lower lobe (series 8, image 88). 6 mm pleural based nodular density at the junction of the RIGHT minor and major fissures (series 8, image 75). Additional pleural based nodule within the anterior aspect of the RIGHT upper lobe measures 8 mm (series 8, image 70). Subsolid nodular density within the RIGHT lung apex measures 8 mm (series 8, image 30). Upper Abdomen: Limited images of the upper abdomen are unremarkable. Musculoskeletal: Large mass within the LEFT breast, incompletely imaged but measuring at least 10 cm greatest dimension. Additional mass within the lower LEFT axilla measures 2.5 cm. Numerous enlarged lymph nodes within the overlying LEFT axilla. Mixed blastic and lytic changes of the sternum. IMPRESSION: 1. Large irregular mass within the LEFT breast, incompletely imaged but measuring at least 10 cm greatest dimension, almost certainly representing invasive breast cancer. 2. Numerous masses within the LEFT axilla, largest measuring 2.5 cm, almost certainly representing metastatic lymphadenopathy. 3.  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  Superimposed pneumonia not excluded if febrile. 4. At least 2 masses within the pre-vascular mediastinum, largest measuring 2.1 cm, likely additional metastatic lymphadenopathy. One or both could also represent direct extension of the LEFT breast mass given the destructive change/discontinuity of the overlying sternum. 5. Small LEFT pleural effusion. LEFT-sided chest tube in place. 6. Multiple RIGHT-sided pulmonary nodules, suspicious for additional metastatic disease. 7. Blastic and lytic lesions throughout the sternum, compatible with metastatic disease. 8. 2.1 cm hypodense lesion within the RIGHT thyroid lobe. Recommend thyroid US (ref: J Am Coll Radiol. 2015 Feb;12(2): 143-50). Aortic Atherosclerosis (ICD10-I70.0). Electronically Signed   By: Franki Cabot M.D.   On: 09/03/2021 13:06   DG Chest Port 1 View  Result Date: 09/03/2021 CLINICAL DATA:  Status post chest tube insertion. EXAM: PORTABLE CHEST 1 VIEW COMPARISON:  09/02/2021. FINDINGS: Cardiac enlargement is unchanged. Loop recorder is again noted in the projection of the left side of heart. Interval placement of left lateral basilar chest tube. Significant interval improvement in aeration to the left lung. No pneumothorax identified. Residual hazy opacities throughout the left lung are now visualized. Right lung is clear. IMPRESSION: 1. Significant interval improvement in aeration to the left lung status post left chest tube placement. Residual hazy opacities throughout the left lung. 2. No pneumothorax. Electronically Signed   By: Kerby Moors M.D.   On: 09/03/2021 10:39   ECHOCARDIOGRAM COMPLETE  Result Date: 09/03/2021    ECHOCARDIOGRAM REPORT   Patient Name:   Heather Rogers Date of Exam: 09/03/2021 Medical Rec #:  [id number]          Height:       62.0 in Accession #:    [id number]         Weight:       150.0 lb Date of Birth:  [birth year]          BSA:          1.692 m Patient Age:    43 years           BP:           104/57 mmHg Patient  Gender: F                  HR:           79 bpm. Exam Location:  Inpatient Procedure: 2D Echo, Cardiac Doppler and Color Doppler Indications:    Pleural effusion on left [[id number]]  History:        Patient has prior history of Echocardiogram examinations, most                 recent 05/15/2018. Signs/Symptoms:Shortness of Breath.  Sonographer:    Merrie Roof RDCS Referring Phys: [place]  1. Asymmetric septal hypertophy. late peaking mild LVOT obstruction. Anterior leaflet appears prolonged, no sig SAM. Consistent with HOCM. Recommend cardiac MRI if clinically indicated. Left ventricular ejection fraction, by estimation, is 60 to 65%. The left ventricle has normal function. Left ventricular diastolic parameters are consistent with Grade I diastolic dysfunction (impaired relaxation).  2. Right ventricular systolic function is normal. The right ventricular size is normal. There is normal pulmonary artery systolic pressure.  3. The mitral valve was not well visualized. No evidence of mitral valve regurgitation.  4. The aortic valve is grossly normal. Aortic valve regurgitation is not visualized.  5. The inferior vena cava is normal  in size with greater than 50% respiratory variability, suggesting right atrial pressure of 3 mmHg. Comparison(s): No significant change from prior study. FINDINGS  Left Ventricle: Asymmetric septal hypertophy. late peaking mild LVOT obstruction. Anterior leaflet appears prolonged, no sig SAM. Consistent with HOCM. Recommend cardiac MRI if clinically indicated. Left ventricular ejection fraction, by estimation, is 60 to 65%. The left ventricle has normal function. The left ventricular internal cavity size was normal in size. Left ventricular diastolic parameters are consistent with Grade I diastolic dysfunction (impaired relaxation). Right Ventricle: The right ventricular size is normal. No increase in right ventricular wall thickness. Right ventricular systolic function is  normal. There is normal pulmonary artery systolic pressure. The tricuspid regurgitant velocity is 2.81 m/s, and  with an assumed right atrial pressure of 3 mmHg, the estimated right ventricular systolic pressure is 35.0 mmHg. Left Atrium: Left atrial size was normal in size. Right Atrium: Right atrial size was normal in size. Pericardium: There is no evidence of pericardial effusion. Mitral Valve: The mitral valve was not well visualized. No evidence of mitral valve regurgitation. Tricuspid Valve: The tricuspid valve is not well visualized. Tricuspid valve regurgitation is mild. Aortic Valve: The aortic valve is grossly normal. Aortic valve regurgitation is not visualized. Aortic valve mean gradient measures 6.0 mmHg. Aortic valve peak gradient measures 10.9 mmHg. Aortic valve area, by VTI measures 2.10 cm. Pulmonic Valve: The pulmonic valve was not well visualized. Pulmonic valve regurgitation is trivial. Aorta: The aortic root and ascending aorta are structurally normal, with no evidence of dilitation. Venous: The inferior vena cava is normal in size with greater than 50% respiratory variability, suggesting right atrial pressure of 3 mmHg. IAS/Shunts: The interatrial septum was not well visualized.  LEFT VENTRICLE PLAX 2D LVIDd:         3.40 cm   Diastology LV PW:         1.00 cm   LV e' medial:    5.98 cm/s LV IVS:        1.00 cm   LV E/e' medial:  11.2 LVOT diam:     1.90 cm   LV e' lateral:   6.53 cm/s LV SV:         65        LV E/e' lateral: 10.2 LV SV Index:   38 LVOT Area:     2.84 cm                           3D Volume EF:                          3D EF:        60 %                          LV EDV:       72 ml                          LV ESV:       29 ml                          LV SV:        43 ml RIGHT VENTRICLE RV Basal diam:  2.70 cm LEFT ATRIUM  Index        RIGHT ATRIUM           Index LA diam:        2.10 cm 1.24 cm/m   RA Area:     11.40 cm LA Vol (A2C):   62.3 ml 36.83 ml/m  RA  Volume:   24.90 ml  14.72 ml/m LA Vol (A4C):   35.3 ml 20.87 ml/m LA Biplane Vol: 46.9 ml 27.72 ml/m  AORTIC VALVE AV Area (Vmax):    2.18 cm AV Area (Vmean):   2.05 cm AV Area (VTI):     2.10 cm AV Vmax:           165.00 cm/s AV Vmean:          110.000 cm/s AV VTI:            0.308 m AV Peak Grad:      10.9 mmHg AV Mean Grad:      6.0 mmHg LVOT Vmax:         127.00 cm/s LVOT Vmean:        79.500 cm/s LVOT VTI:          0.228 m LVOT/AV VTI ratio: 0.74  AORTA Ao Root diam: 3.00 cm Ao Asc diam:  2.50 cm MITRAL VALVE               TRICUSPID VALVE MV Area (PHT): 3.72 cm    TR Peak grad:   31.6 mmHg MV Decel Time: 204 msec    TR Vmax:        281.00 cm/s MV E velocity: 66.90 cm/s MV A velocity: 85.20 cm/s  SHUNTS MV E/A ratio:  0.79        Systemic VTI:  0.23 m                            Systemic Diam: 1.90 cm Phineas Inches Electronically signed by Phineas Inches Signature Date/Time: 09/03/2021/2:49:14 PM    Final    VAS [place] LOWER EXTREMITY VENOUS (DVT)  Result Date: 09/03/2021  Lower Venous DVT Study Patient Name:  ZAKAIYA LARES  Date of Exam:   09/03/2021 Medical Rec #: [id number]           Accession #:    [id number] Date of Birth: 10-02-1945           Patient Gender: F Patient Age:   71 years Exam Location:  [facility] Procedure:      VAS [place] LOWER EXTREMITY VENOUS (DVT) Referring Phys: Oren Binet --------------------------------------------------------------------------------  Indications: Swelling, Edema, and elevated ddimer.  Comparison Study: no prior Performing Technologist: Archie Patten RVS  Examination Guidelines: A complete evaluation includes B-mode imaging, spectral Doppler, color Doppler, and power Doppler as needed of all accessible portions of each vessel. Bilateral testing is considered an integral part of a complete examination. Limited examinations for reoccurring indications may be performed as noted. The reflux portion of the exam is performed with the patient in reverse  Trendelenburg.  +---------+---------------+---------+-----------+----------+--------------+  RIGHT     Compressibility Phasicity Spontaneity Properties Thrombus Aging  +---------+---------------+---------+-----------+----------+--------------+  CFV       Full            Yes       Yes                                    +---------+---------------+---------+-----------+----------+--------------+  SFJ       Full                                                             +---------+---------------+---------+-----------+----------+--------------+  FV Prox   Full                                                             +---------+---------------+---------+-----------+----------+--------------+  FV Mid    Full                                                             +---------+---------------+---------+-----------+----------+--------------+  FV Distal Full                                                             +---------+---------------+---------+-----------+----------+--------------+  PFV       Full                                                             +---------+---------------+---------+-----------+----------+--------------+  POP       Full            Yes       Yes                                    +---------+---------------+---------+-----------+----------+--------------+  PTV       Full                                                             +---------+---------------+---------+-----------+----------+--------------+  PERO      Full                                                             +---------+---------------+---------+-----------+----------+--------------+   +---------+---------------+---------+-----------+----------+--------------+  LEFT      Compressibility Phasicity Spontaneity Properties Thrombus Aging  +---------+---------------+---------+-----------+----------+--------------+  CFV       Full            Yes       Yes                                     +---------+---------------+---------+-----------+----------+--------------+  SFJ       Full                                                             +---------+---------------+---------+-----------+----------+--------------+  FV Prox   Full                                                             +---------+---------------+---------+-----------+----------+--------------+  FV Mid    Full                                                             +---------+---------------+---------+-----------+----------+--------------+  FV Distal Full                                                             +---------+---------------+---------+-----------+----------+--------------+  PFV       Full                                                             +---------+---------------+---------+-----------+----------+--------------+  POP       Full            Yes       Yes                                    +---------+---------------+---------+-----------+----------+--------------+  PTV       Full                                                             +---------+---------------+---------+-----------+----------+--------------+  PERO      Full                                                             +---------+---------------+---------+-----------+----------+--------------+     Summary: BILATERAL: - No evidence of deep vein thrombosis seen in the lower extremities, bilaterally. -No evidence of popliteal cyst, bilaterally.   *See table(s) above for measurements and observations.    Preliminary     Assessment and Plan:  1.  Left breast mass with pleural effusion, bone lesions, and pulmonary  lesions 2.  Large left pleural effusion 3.  Microcytic anemia 4.  AKI 5.  Type 2 diabetes mellitus 6.  HOCM 7.  Hypertension 8.  History of CVA  -The patient has had a left breast mass for at least a few years.  No recent mammogram.  We discussed that CT scan findings are concerning for metastatic malignancy.  This is  highly suspicious for metastatic breast cancer.  Cytology from pleural fluid is pending.  ECOG performance status is estimated to be about 1-2. We will discuss treatment options pending cytology results.  If cytology is negative, will need biopsy of breast mass or left axillary lymphadenopathy. -Pulmonology following for left pleural effusion.  Chest tube has been clamped and they will evaluate for accumulation and consider Pleurx catheter. -The patient is noted to have microcytic anemia.  She is not iron deficient.  May be due to underlying malignancy and renal dysfunction.  Monitor for now. -Management of chronic medical conditions per hospitalist.  Thank you for this referral.   Mikey Bussing, DNP, AGPCNP-BC, AOCNP   Attending Note  I personally saw the patient, reviewed the chart and examined the patient. The plan of care was discussed with the patient . I agree with the assessment and plan as documented above. Thank you very much for the consultation.  Clinical suspicion of metastatic breast cancer: Extremely large breast mass.  It is extending to the entirety of the left breast which is very hard.  Multiple cutaneous nodules on the breast were also noted.  No skin breakdown.  Bulky enlarged lymph nodes.  Large pleural effusion, lung nodules, bone metastases.  This is suggestive of metastatic breast cancer. I discussed with the patient that breast prognostic panel will need to be available to discuss treatment options. Prior to this hospitalization patient had extremely good performance status and has been independent for the most part.  Her daughter is a primary support for her.  She lives with her grandson. Cytology is pending and only then we can discuss prognosis and treatment plan. If she is ER/PR positive, then she may potentially have a reasonable life expectancy.  Given the fact that it has been going on for at least 2 years if not longer I suspect this is most likely estrogen receptor  positive metastatic breast cancer. Pleural effusion: Status postthoracentesis and chest tube is in place: Cytology is pending. Her daughter drives a schoolbus and therefore I will call her tomorrow between 10 and 1 PM.

## 2021-09-04 NOTE — Assessment & Plan Note (Addendum)
See CT chest findings-along with pleural effusion-highly suspicious that this is all due to metastatic breast cancer.  Cytology from pleural fluid consistent with metastatic adenocarcinoma as well.  Appreciate oncology input-oncology will arrange for outpatient follow-up.

## 2021-09-04 NOTE — Evaluation (Signed)
Physical Therapy Evaluation Patient Details Name: Heather Rogers MRN: [id number] DOB: [birth year] Today's Date: 09/04/2021  History of Present Illness  76 y.o. female presents to [facility] hospital on 09/02/2021 with SOB, found to have a large left-sided pleural effusion. Pt underwent chest tube placement on 09/03/2021. PMH includes CVA, DMII, HTN.  Clinical Impression  Pt presents to PT with deficits in endurance, gait, balance, strength, power. Pt is limited by reports of SOB, desaturating when mobilizing on 1L [place] at this time. Pt will benefit from frequent mobilization to aide in improving activity tolerance and restoring independence. Acute PT to follow.       Recommendations for follow up therapy are one component of a multi-disciplinary discharge planning process, led by the attending physician.  Recommendations may be updated based on patient status, additional functional criteria and insurance authorization.  Follow Up Recommendations No PT follow up (hopeful for no PT pending progress)    Assistance Recommended at Discharge PRN  Patient can return home with the following  A little help with walking and/or transfers;Help with stairs or ramp for entrance;A little help with bathing/dressing/bathroom    Equipment Recommendations None recommended by PT  Recommendations for Other Services       Functional Status Assessment Patient has had a recent decline in their functional status and demonstrates the ability to make significant improvements in function in a reasonable and predictable amount of time.     Precautions / Restrictions Precautions Precautions: Fall Precaution Comments: monitor SpO2 Restrictions Weight Bearing Restrictions: No      Mobility  Bed Mobility Overal bed mobility: Modified Independent             General bed mobility comments: increased time    Transfers Overall transfer level: Needs assistance Equipment used: None Transfers: Sit to/from Stand Sit  to Stand: Supervision                Ambulation/Gait Ambulation/Gait assistance: Supervision Gait Distance (Feet): 80 Feet Assistive device: None Gait Pattern/deviations: Step-through pattern Gait velocity: reduced Gait velocity interpretation: 1.31 - 2.62 ft/sec, indicative of limited community ambulator   General Gait Details: pt with slowed step-through gait, reduced stride length  Stairs            Wheelchair Mobility    Modified Rankin (Stroke Patients Only)       Balance Overall balance assessment: Needs assistance Sitting-balance support: No upper extremity supported, Feet supported Sitting balance-Leahy Scale: Good     Standing balance support: No upper extremity supported, During functional activity Standing balance-Leahy Scale: Good                               Pertinent Vitals/Pain Pain Assessment Pain Assessment: No/denies pain    Home Living Family/patient expects to be discharged to:: Private residence Living Arrangements: Other relatives (grandson) Available Help at Discharge: Family;Available PRN/intermittently Type of Home: House Home Access: Ramped entrance       Home Layout: One level Home Equipment: BSC/3in1      Prior Function Prior Level of Function : Independent/Modified Independent                     Hand Dominance   Dominant Hand: Right    Extremity/Trunk Assessment   Upper Extremity Assessment Upper Extremity Assessment: Overall WFL for tasks assessed    Lower Extremity Assessment Lower Extremity Assessment: Generalized weakness    Cervical / Trunk Assessment Cervical /  Trunk Assessment: Normal  Communication   Communication: No difficulties  Cognition Arousal/Alertness: Awake/alert Behavior During Therapy: WFL for tasks assessed/performed Overall Cognitive Status: Within Functional Limits for tasks assessed                                          General Comments  General comments (skin integrity, edema, etc.): pt on 1L [place] at rest upon PT arrival, PT weans to room air, pt desats to 83% with ambulation. Pt recovers when resting on 1L [place].    Exercises     Assessment/Plan    PT Assessment Patient needs continued PT services  PT Problem List Decreased strength;Decreased activity tolerance;Decreased balance;Decreased mobility;Cardiopulmonary status limiting activity       PT Treatment Interventions DME instruction;Gait training;Functional mobility training;Therapeutic activities;Therapeutic exercise;Balance training;Patient/family education    PT Goals (Current goals can be found in the Care Plan section)  Acute Rehab PT Goals Patient Stated Goal: to go home and return to prior level of function PT Goal Formulation: With patient Time For Goal Achievement: 09/18/21 Potential to Achieve Goals: Good Additional Goals Additional Goal #1: Pt will report 3/10 DOE or less when ambulating >250' to demonstrate improved activity tolerance    Frequency Min 3X/week     Co-evaluation               AM-PAC PT "6 Clicks" Mobility  Outcome Measure Help needed turning from your back to your side while in a flat bed without using bedrails?: None Help needed moving from lying on your back to sitting on the side of a flat bed without using bedrails?: None Help needed moving to and from a bed to a chair (including a wheelchair)?: A Little Help needed standing up from a chair using your arms (e.g., wheelchair or bedside chair)?: A Little Help needed to walk in hospital room?: A Little Help needed climbing 3-5 steps with a railing? : A Little 6 Click Score: 20    End of Session Equipment Utilized During Treatment: Oxygen Activity Tolerance: Patient limited by fatigue Patient left: in chair;with call bell/phone within reach;with chair alarm set Nurse Communication: Mobility status PT Visit Diagnosis: Other abnormalities of gait and mobility (R26.89)     Time: 2376-2831 PT Time Calculation (min) (ACUTE ONLY): 21 min   Charges:   PT Evaluation $PT Eval Low Complexity: 1 Low          Zenaida Niece, PT, DPT Acute Rehabilitation Pager: [phone] Office [place] 09/04/2021, 10:36 AM

## 2021-09-05 ENCOUNTER — Inpatient Hospital Stay (HOSPITAL_COMMUNITY): Payer: Medicare HMO

## 2021-09-05 DIAGNOSIS — J90 Pleural effusion, not elsewhere classified: Secondary | ICD-10-CM | POA: Diagnosis not present

## 2021-09-05 DIAGNOSIS — N179 Acute kidney failure, unspecified: Secondary | ICD-10-CM | POA: Diagnosis not present

## 2021-09-05 DIAGNOSIS — D649 Anemia, unspecified: Secondary | ICD-10-CM | POA: Diagnosis not present

## 2021-09-05 DIAGNOSIS — E785 Hyperlipidemia, unspecified: Secondary | ICD-10-CM | POA: Diagnosis not present

## 2021-09-05 LAB — BASIC METABOLIC PANEL
Anion gap: 10 (ref 5–15)
BUN: 52 mg/dL — ABNORMAL HIGH (ref 8–23)
CO2: 20 mmol/L — ABNORMAL LOW (ref 22–32)
Calcium: 9 mg/dL (ref 8.9–10.3)
Chloride: 107 mmol/L (ref 98–111)
Creatinine, Ser: 1.39 mg/dL — ABNORMAL HIGH (ref 0.44–1.00)
GFR, Estimated: 40 mL/min — ABNORMAL LOW (ref >60)
Glucose, Bld: 153 mg/dL — ABNORMAL HIGH (ref 70–99)
Potassium: 4 mmol/L (ref 3.5–5.1)
Sodium: 137 mmol/L (ref 135–145)

## 2021-09-05 LAB — GLUCOSE, CAPILLARY
Glucose-Capillary: 135 mg/dL — ABNORMAL HIGH (ref 70–99)
Glucose-Capillary: 167 mg/dL — ABNORMAL HIGH (ref 70–99)
Glucose-Capillary: 309 mg/dL — ABNORMAL HIGH (ref 70–99)
Glucose-Capillary: 81 mg/dL (ref 70–99)

## 2021-09-05 LAB — TRIGLYCERIDES, BODY FLUIDS: Triglycerides, Fluid: 31 mg/dL

## 2021-09-05 NOTE — Progress Notes (Signed)
° °  NAME:  Heather Rogers, MRN:  [id number], DOB:  October 26, 1945, LOS: 2 ADMISSION DATE:  09/02/2021, CONSULTATION DATE:  09/03/21 REFERRING MD:  Sloan Leiter, CHIEF COMPLAINT:  sob   History of Present Illness:  76 year old woman with distant hx of smoking presenting with 1 week of worsening SOB, initially just with exertion now at rest.  Imaging in ER shows complete opacification of L hemithorax.  PCCM consulted for management.  Patient denies fevers, chills, pleurisy, cough, trauma, prior chest surgeries.  Does have 1-2 day hx of nausea and dry heaving.  Pertinent  Medical History  DM2 HTN CVA  Significant Hospital Events: Including procedures, antibiotic start and stop dates in addition to other pertinent events   09/03/21 consulted 2/6 Documented output from CT since insertion is 2,525 2/7 Chest tube clamped yesterday afternoon to determine need for Pleurx cath. Repeat CXR this am with increased effusion, likely that patient will need Pleurx   Interim History / Subjective:  Seen sitting up in bedside recliner with no acute complaints   Objective   Blood pressure 122/68, pulse 82, temperature 98.5 F (36.9 C), temperature source Oral, resp. rate 20, height 5\' 2"  (1.575 m), weight 64.8 kg, SpO2 93 %.        Intake/Output Summary (Last 24 hours) at 09/05/2021 1042 Last data filed at 09/04/2021 2323 Gross per 24 hour  Intake 480 ml  Output 430 ml  Net 50 ml    Filed Weights   09/02/21 1827 09/04/21 0555  Weight: 68 kg 64.8 kg    Examination: General: Acute on chronically ill appearing thin elderly female lying in bed in NAD  HEENT: [place]/AT, MM pink/moist, PERRL,  Neuro: Alert and oriented x3, non-focal  CV: s1s2 regular rate and rhythm, no murmur, rubs, or gallops,  PULM:  Clear to ascultation, no increased work of breathing, diminished left base, chest tube in place  GI: soft, bowel sounds active in all 4 quadrants, non-tender, non-distended, tolerating oral diet Extremities:  warm/dry, no edema  Skin: no rashes or lesions  Resolved Hospital Problem list   N/A  Assessment & Plan:  Large left plural effusion in the setting of likely metastatic breast cancer  - large effusion on [place], no infectious s/s.  Hx of smoking in distant past.  Concerned for malignancy.   P: Keep chest tube clamped, patient will likely need Pleurx cath prior to discharge  Routine chest tube care  Follow cytology, remains pending Mobilize as able  Pulmonary hygiene   Best Practice (right click and "Reselect all SmartList Selections" daily)  Per primary   Critical care: NA  Maris Abascal D. Kenton Kingfisher, NP-C [facility] Personal contact information can be found on Amion  09/05/2021, 10:42 AM

## 2021-09-05 NOTE — Progress Notes (Signed)
Occupational Therapy Treatment Patient Details Name: Heather Rogers MRN: [id number] DOB: 1945-10-10 Today's Date: 09/05/2021   History of present illness 76 y.o. female presents to [facility] hospital on 09/02/2021 with SOB, found to have a large left-sided pleural effusion. Pt underwent chest tube placement on 09/03/2021. PMH includes CVA, DMII, HTN.   OT comments  Pt making steady progress towards OT goals this session. Pt continues to present with decreased activity tolerance, generalized deconditioning and decreased cardiopulmonary endurance. Pt currently requires supervision for UB ADLs and min guard assist for functional ambulation with no AD. Pt on 1L [place] with SpO2 dropping briefly to 84% during ambulation but able to increase to >90% with standing rest break and pursed lip breathing. Education provided on  energy conservation strategies for home with pt verbalizing understanding of education. Pt would continue to benefit from skilled occupational therapy while admitted to address the below listed limitations in order to improve overall functional mobility and facilitate independence with BADL participation. DC plan remains appropriate, will follow acutely per POC.      Recommendations for follow up therapy are one component of a multi-disciplinary discharge planning process, led by the attending physician.  Recommendations may be updated based on patient status, additional functional criteria and insurance authorization.    Follow Up Recommendations  No OT follow up    Assistance Recommended at Discharge Intermittent Supervision/Assistance  Patient can return home with the following  A little help with bathing/dressing/bathroom;A little help with walking and/or transfers;Assistance with cooking/housework   Equipment Recommendations  Tub/shower bench    Recommendations for Other Services      Precautions / Restrictions Precautions Precautions: Fall Precaution Comments: monitor SpO2, chest  tube Restrictions Weight Bearing Restrictions: No       Mobility Bed Mobility Overal bed mobility: Modified Independent                  Transfers Overall transfer level: Needs assistance Equipment used: None   Sit to Stand: Min guard           General transfer comment: min guard to rise from EOB for line mgmt and safety     Balance Overall balance assessment: Needs assistance Sitting-balance support: No upper extremity supported, Feet supported Sitting balance-Leahy Scale: Good     Standing balance support: No upper extremity supported, During functional activity Standing balance-Leahy Scale: Fair                             ADL either performed or assessed with clinical judgement   ADL Overall ADL's : Needs assistance/impaired                 Upper Body Dressing : Supervision/safety;Set up;Sitting Upper Body Dressing Details (indicate cue type and reason): to don back side gown     Toilet Transfer: Min guard;Ambulation Toilet Transfer Details (indicate cue type and reason): simulated via functional mobilty with no AD         Functional mobility during ADLs: Min guard General ADL Comments: pt continues to present with decreased activity tolerance, generalized deconditioning and decreased cardiopulmonary endurance    Extremity/Trunk Assessment Upper Extremity Assessment Upper Extremity Assessment: Generalized weakness   Lower Extremity Assessment Lower Extremity Assessment: Defer to PT evaluation   Cervical / Trunk Assessment Cervical / Trunk Assessment: Normal    Vision Baseline Vision/History: 1 Wears glasses Ability to See in Adequate Light: 0 Adequate Patient Visual Report: No change from  baseline     Perception Perception Perception: Within Functional Limits   Praxis Praxis Praxis: Intact    Cognition Arousal/Alertness: Awake/alert Behavior During Therapy: WFL for tasks assessed/performed Overall Cognitive  Status: Within Functional Limits for tasks assessed                                          Exercises      Shoulder Instructions       General Comments pt on 1L [place] with SpO2 dropping to 84% briefly but able to increase to > 90% with standing rest break and pursed lip breathing, energy conservation handout provided with full education provided on strategies for home    Pertinent Vitals/ Pain       Pain Assessment Pain Assessment: Faces Faces Pain Scale: Hurts a little bit Pain Location: L side at chest tube insertion site with bed mobility Pain Descriptors / Indicators: Sore Pain Intervention(s): Monitored during session, Repositioned  Home Living                                          Prior Functioning/Environment              Frequency  Min 2X/week        Progress Toward Goals  OT Goals(current goals can now be found in the care plan section)  Progress towards OT goals: Progressing toward goals  Acute Rehab OT Goals Patient Stated Goal: to go home OT Goal Formulation: With patient Time For Goal Achievement: 09/18/21 Potential to Achieve Goals: Good  Plan Discharge plan remains appropriate;Frequency remains appropriate    Co-evaluation                 AM-PAC OT "6 Clicks" Daily Activity     Outcome Measure   Help from another person eating meals?: None Help from another person taking care of personal grooming?: A Little Help from another person toileting, which includes using toliet, bedpan, or urinal?: A Little Help from another person bathing (including washing, rinsing, drying)?: A Little Help from another person to put on and taking off regular upper body clothing?: None Help from another person to put on and taking off regular lower body clothing?: A Little 6 Click Score: 20    End of Session    OT Visit Diagnosis: Other abnormalities of gait and mobility (R26.89);Muscle weakness (generalized)  (M62.81)   Activity Tolerance Patient tolerated treatment well   Patient Left in bed;with call bell/phone within reach;with bed alarm set   Nurse Communication Mobility status        Time: 3818-2993 OT Time Calculation (min): 26 min  Charges: OT General Charges $OT Visit: 1 Visit OT Treatments $Therapeutic Activity: 23-37 mins  Harley Alto., COTA/L Acute Rehabilitation Services [phone]   Precious Haws 09/05/2021, 3:20 PM

## 2021-09-05 NOTE — Progress Notes (Signed)
PROGRESS NOTE        PATIENT DETAILS Name: Heather Rogers Age: 76 y.o. Sex: female Date of Birth: 1946/07/22 Admit Date: 09/02/2021 Admitting Physician Evalee Mutton Kristeen Mans, MD UYQ:[id number], Charlane Ferretti, MD  Brief Summary: Patient is a 76 y.o.  female CVA with right hemiparesis, DM-2, HTN-presenting with shortness of breath-found to have a large left-sided pleural effusion.  Further work-up revealed exudative pleural effusion- likely due to metastatic breast cancer.  See below for further details  Significant Hospital events: 2/4>> admit to [facility] for shortness of breath-found to have large left sided pleural effusion.  Significant imaging studies: 2/4>> CXR: Left lung whiteout 2/5>> bilateral lower extremity Doppler: Negative 2/5>> Echo: EF 60-65%, asymmetric septal hypertrophy-consistent with HOCUM 2/5>> CT chest: 10 cm left breast mass, axillary/mediastinal lymphadenopathy, suspicion for lymphangitic spread, blastic/lytic lesions throughout the sternum.  2.1 cm dense right thyroid lobe nodule. 2/7>> CXR: Worsening left pleural effusion  Significant microbiology data: 2/4>> FLU/COVID PCR: Negative 2/5>> pleural fluid cultures: No growth  Procedures: 2/5>> chest tube insertion  Consults:  PCCM, oncology  Subjective: No chest pain or shortness of breath-lying comfortably in bed.  Objective: Vitals: Blood pressure 122/68, pulse 82, temperature 98.5 F (36.9 C), temperature source Oral, resp. rate 20, height 5\' 2"  (1.575 m), weight 64.8 kg, SpO2 93 %.   Exam: Gen Exam:Alert awake-not in any distress HEENT:atraumatic, normocephalic Chest: B/L clear to auscultation anteriorly CVS:S1S2 regular Abdomen:soft non tender, non distended Extremities:no edema Neurology: Non focal Skin: no rash   Pertinent Labs/Radiology: CBC Latest Ref Rng & Units 09/04/2021 09/03/2021 09/02/2021  WBC 4.0 - 10.5 K/uL 6.1 7.7 8.9  Hemoglobin 12.0 - 15.0 g/dL 8.8(L) 9.7(L) 9.6(L)   Hematocrit 36.0 - 46.0 % 28.6(L) 30.6(L) 30.7(L)  Platelets 150 - 400 K/uL 392 345 489(H)    Lab Results  Component Value Date   NA 137 09/05/2021   K 4.0 09/05/2021   CL 107 09/05/2021   CO2 20 (L) 09/05/2021      Assessment/Plan: * Pleural effusion on left- (present on admission) S/p left-sided pigtail chest tube placement by PCCM on 2/5.  Pleural fluid analysis consistent with an exudate-given findings on CT chest-highly suspicion that this is due to stage IV breast cancer.  Awaiting cytology, chest tube clamped yesterday-chest x-ray with worsening left-sided pleural effusion-PCCM planning on Pleurx catheter insertion prior to discharge.    Breast mass, left- (present on admission) See CT chest findings-along with pleural effusion-highly suspicious that this is all due to metastatic breast cancer.  Appreciate oncology input-awaiting cytology from pleural fluid, if negative, may need biopsy from breast/axilla.  AKI (acute kidney injury) ([place]) Likely hemodynamically mediated creatinine slowly downtrending-close to baseline.  Microcytic anemia- (present on admission) Appears to be a chronic issue-iron panel not suggestive of iron deficiency anemia.  Defer further to the outpatient setting.Marland Kitchen  HOCM (hypertrophic obstructive cardiomyopathy) ([place])- (present on admission) New finding on echo-already on beta-blocker.  Avoid excessive reduction of preload.  Will need outpatient cardiology evaluation.  Type 2 diabetes mellitus (A1c 6.4 on 2/5) - (present on admission) CBG stable with SSI.  Resume on discharge.   Recent Labs    09/04/21 1640 09/04/21 2057 09/05/21 0743  GLUCAP 134* 204* 135*    Hypertension associated with diabetes ([place])- (present on admission) BP controlled-continue Coreg-no longer on amlodipine.  Dyslipidemia- (present on admission) Continue statin.  Gout- (  present on admission) Stable/no flare-continue allopurinol.  History of CVA Appears to move all 4  extremities symmetrically-on aspirin/statin.  Cognitive decline- (present on admission) Daughter acknowledges some cognitive dysfunction at baseline-after CVA-needs formal neuropsychological testing in the outpatient setting.  May have some element of dementia/vascular dementia at baseline.  Positive D dimer Not hypoxic-unclear significance-lower extremity Dopplers are negative-doubt further work-up is required at this point.  Coagulopathy ([place])- (present on admission) dtr reports pt was on Eliquis research study protocol but stopped taking any research meds 3 weeks ago. Pt only takes 81 mg ASA at this point.   BMI: Estimated body mass index is 26.13 kg/m as calculated from the following:   Height as of this encounter: 5\' 2"  (1.575 m).   Weight as of this encounter: 64.8 kg.   Code status:   Code Status: Full Code   DVT Prophylaxis: heparin injection 5,000 Units Start: 09/03/21 2200 SCDs Start: 09/02/21 2259   Family Communication: Daughter-[name] [phone] updated over the phone on 2/6   Disposition Plan: Status is: Observation The patient will require care spanning > 2 midnights and should be moved to inpatient because: Chest tube in place-awaiting further work-up-not yet stable for discharge.   Planned Discharge Destination:Home   Diet: Diet Order             Diet heart healthy/carb modified Room service appropriate? Yes; Fluid consistency: Thin  Diet effective now                     Antimicrobial agents: Anti-infectives (From admission, onward)    Start     Dose/Rate Route Frequency Ordered Stop   09/02/21 2030  cefTRIAXone (ROCEPHIN) 1 g in sodium chloride 0.9 % 100 mL IVPB        1 g 200 mL/hr over 30 Minutes Intravenous  Once 09/02/21 2027 09/02/21 2138   09/02/21 2030  azithromycin (ZITHROMAX) 500 mg in sodium chloride 0.9 % 250 mL IVPB        500 mg 250 mL/hr over 60 Minutes Intravenous  Once 09/02/21 2027 09/02/21 2238         MEDICATIONS: Scheduled Meds:  allopurinol  100 mg Oral Daily   aspirin EC  81 mg Oral Daily   atorvastatin  80 mg Oral QHS   carvedilol  25 mg Oral BID WC   gabapentin  100 mg Oral TID   heparin  5,000 Units Subcutaneous Q8H   insulin aspart  0-5 Units Subcutaneous QHS   insulin aspart  0-9 Units Subcutaneous TID WC   Continuous Infusions: PRN Meds:.acetaminophen **OR** acetaminophen, albuterol, melatonin, morphine injection, ondansetron **OR** ondansetron (ZOFRAN) IV   I have personally reviewed following labs and imaging studies  LABORATORY DATA: CBC: Recent Labs  Lab 09/02/21 1840 09/03/21 0037 09/04/21 0350  WBC 8.9 7.7 6.1  NEUTROABS  --  5.8  --   HGB 9.6* 9.7* 8.8*  HCT 30.7* 30.6* 28.6*  MCV 71.9* 70.8* 71.9*  PLT 489* 345 956    Basic Metabolic Panel: Recent Labs  Lab 09/02/21 1840 09/03/21 0037 09/03/21 0759 09/04/21 0350 09/05/21 0127  NA 136  --  139 138 137  K 5.1  --  4.5 4.1 4.0  CL 103  --  109 106 107  CO2 18*  --  20* 21* 20*  GLUCOSE 161*  --  95 116* 153*  BUN 70*  --  63* 60* 52*  CREATININE 1.61* 1.49* 1.54* 1.78* 1.39*  CALCIUM 9.6  --  9.0 8.9 9.0  MG  --  2.6*  --   --   --     GFR: Estimated Creatinine Clearance: 30.9 mL/min (A) (by C-G formula based on SCr of 1.39 mg/dL (H)).  Liver Function Tests: Recent Labs  Lab 09/03/21 0759 09/04/21 0350  AST  --  64*  ALT  --  61*  ALKPHOS  --  50  BILITOT  --  0.2*  PROT 6.5 6.1*  ALBUMIN  --  2.2*   No results for input(s): LIPASE, AMYLASE in the last 168 hours. No results for input(s): AMMONIA in the last 168 hours.  Coagulation Profile: No results for input(s): INR, PROTIME in the last 168 hours.  Cardiac Enzymes: No results for input(s): CKTOTAL, CKMB, CKMBINDEX, TROPONINI in the last 168 hours.  BNP (last 3 results) No results for input(s): PROBNP in the last 8760 hours.  Lipid Profile: No results for input(s): CHOL, HDL, LDLCALC, TRIG, CHOLHDL, LDLDIRECT in  the last 72 hours.  Thyroid Function Tests: Recent Labs    09/02/21 1840  TSH 3.246    Anemia Panel: Recent Labs    09/02/21 1840  FERRITIN 894*  TIBC 223*  IRON 40    Urine analysis:    Component Value Date/Time   COLORURINE YELLOW 07/25/2020 1057   APPEARANCEUR HAZY (A) 07/25/2020 1057   LABSPEC 1.017 07/25/2020 1057   PHURINE 5.0 07/25/2020 1057   GLUCOSEU NEGATIVE 07/25/2020 1057   HGBUR SMALL (A) 07/25/2020 1057   BILIRUBINUR NEGATIVE 07/25/2020 1057   KETONESUR 5 (A) 07/25/2020 1057   PROTEINUR 30 (A) 07/25/2020 1057   UROBILINOGEN 0.2 12/20/2009 1049   NITRITE NEGATIVE 07/25/2020 1057   LEUKOCYTESUR LARGE (A) 07/25/2020 1057    Sepsis Labs: Lactic Acid, Venous No results found for: LATICACIDVEN  MICROBIOLOGY: Recent Results (from the past 240 hour(s))  Resp Panel by RT-PCR (Flu A&B, Covid) Nasopharyngeal Swab     Status: None   Collection Time: 09/02/21  6:40 PM   Specimen: Nasopharyngeal Swab; Nasopharyngeal(NP) swabs in vial transport medium  Result Value Ref Range Status   SARS Coronavirus 2 by RT PCR NEGATIVE NEGATIVE Final    Comment: (NOTE) SARS-CoV-2 target nucleic acids are NOT DETECTED.  The SARS-CoV-2 RNA is generally detectable in upper respiratory specimens during the acute phase of infection. The lowest concentration of SARS-CoV-2 viral copies this assay can detect is 138 copies/mL. A negative result does not preclude SARS-Cov-2 infection and should not be used as the sole basis for treatment or other patient management decisions. A negative result may occur with  improper specimen collection/handling, submission of specimen other than nasopharyngeal swab, presence of viral mutation(s) within the areas targeted by this assay, and inadequate number of viral copies(<138 copies/mL). A negative result must be combined with clinical observations, patient history, and epidemiological information. The expected result is Negative.  Fact Sheet  for Patients:  EntrepreneurPulse.com.au  Fact Sheet for Healthcare Providers:  IncredibleEmployment.be  This test is no t yet approved or cleared by the [place] FDA and  has been authorized for detection and/or diagnosis of SARS-CoV-2 by FDA under an Emergency Use Authorization (EUA). This EUA will remain  in effect (meaning this test can be used) for the duration of the COVID-19 declaration under Section 564(b)(1) of the Act, 21 U.S.C.section 360bbb-3(b)(1), unless the authorization is terminated  or revoked sooner.       Influenza A by PCR NEGATIVE NEGATIVE Final   Influenza B by PCR NEGATIVE NEGATIVE Final  Comment: (NOTE) The Xpert Xpress SARS-CoV-2/FLU/RSV plus assay is intended as an aid in the diagnosis of influenza from Nasopharyngeal swab specimens and should not be used as a sole basis for treatment. Nasal washings and aspirates are unacceptable for Xpert Xpress SARS-CoV-2/FLU/RSV testing.  Fact Sheet for Patients: EntrepreneurPulse.com.au  Fact Sheet for Healthcare Providers: IncredibleEmployment.be  This test is not yet approved or cleared by the [place] FDA and has been authorized for detection and/or diagnosis of SARS-CoV-2 by FDA under an Emergency Use Authorization (EUA). This EUA will remain in effect (meaning this test can be used) for the duration of the COVID-19 declaration under Section 564(b)(1) of the Act, 21 U.S.C. section 360bbb-3(b)(1), unless the authorization is terminated or revoked.  Performed at [facility], [place] [address] [place], [place] [address]   Body fluid culture w Gram Stain     Status: None (Preliminary result)   Collection Time: 09/03/21  8:30 AM   Specimen: Pleural Fluid  Result Value Ref Range Status   Specimen Description PLEURAL FLUID  Final   Special Requests LEFT  Final   Gram Stain   Final    WBC PRESENT,BOTH PMN AND  MONONUCLEAR NO ORGANISMS SEEN CYTOSPIN SMEAR    Culture   Final    NO GROWTH 2 DAYS Performed at [facility], [place] [address], [place], [place] [address]    Report Status PENDING  Incomplete    RADIOLOGY STUDIES/RESULTS: CT CHEST WO CONTRAST  Result Date: 09/03/2021 CLINICAL DATA:  Pleural effusion, malignancy suspected. EXAM: CT CHEST WITHOUT CONTRAST TECHNIQUE: Multidetector CT imaging of the chest was performed following the standard protocol without IV contrast. RADIATION DOSE REDUCTION: This exam was performed according to the departmental dose-optimization program which includes automated exposure control, adjustment of the mA and/or kV according to patient size and/or use of iterative reconstruction technique. COMPARISON:  None. FINDINGS: Cardiovascular: No thoracic aortic aneurysm. Aortic atherosclerosis present. Mediastinum/Nodes: Mass within the pre-vascular space measures approximately 2.1 cm (axial series 3, image 21). Additional mass within the pre-vascular space measures 1.2 cm (axial series 3, image 18). 2.1 cm hypodense lesion within the RIGHT thyroid lobe, likely an incidental thyroid cyst. Lungs/Pleura: Mixture of dense consolidations and ground-glass opacities throughout the LEFT lung. Small LEFT pleural effusion. LEFT-sided chest tube in place. 9 mm nodular density within the medial aspect of the RIGHT lower lobe (series 8, image 88). 6 mm pleural based nodular density at the junction of the RIGHT minor and major fissures (series 8, image 75). Additional pleural based nodule within the anterior aspect of the RIGHT upper lobe measures 8 mm (series 8, image 70). Subsolid nodular density within the RIGHT lung apex measures 8 mm (series 8, image 30). Upper Abdomen: Limited images of the upper abdomen are unremarkable. Musculoskeletal: Large mass within the LEFT breast, incompletely imaged but measuring at least 10 cm greatest dimension. Additional mass within the lower LEFT axilla  measures 2.5 cm. Numerous enlarged lymph nodes within the overlying LEFT axilla. Mixed blastic and lytic changes of the sternum. IMPRESSION: 1. Large irregular mass within the LEFT breast, incompletely imaged but measuring at least 10 cm greatest dimension, almost certainly representing invasive breast cancer. 2. Numerous masses within the LEFT axilla, largest measuring 2.5 cm, almost certainly representing metastatic lymphadenopathy. 3. 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 Superimposed pneumonia not excluded if febrile. 4. At least 2 masses within the pre-vascular mediastinum, largest measuring 2.1 cm,  likely additional metastatic lymphadenopathy. One or both could also represent direct extension of the LEFT breast mass given the destructive change/discontinuity of the overlying sternum. 5. Small LEFT pleural effusion. LEFT-sided chest tube in place. 6. Multiple RIGHT-sided pulmonary nodules, suspicious for additional metastatic disease. 7. Blastic and lytic lesions throughout the sternum, compatible with metastatic disease. 8. 2.1 cm hypodense lesion within the RIGHT thyroid lobe. Recommend thyroid US (ref: J Am Coll Radiol. 2015 Feb;12(2): 143-50). Aortic Atherosclerosis (ICD10-I70.0). Electronically Signed   By: Franki Cabot M.D.   On: 09/03/2021 13:06   DG CHEST PORT 1 VIEW  Result Date: 09/05/2021 CLINICAL DATA:  Chest tube EXAM: PORTABLE CHEST 1 VIEW COMPARISON:  Two days ago FINDINGS: Increased hazy opacification of the left chest. Pleural fluid is seen along the lateral lung edge. Normal heart size and mediastinal contours given rotation. Implantable loop recorder over the left chest. Clear right lung. IMPRESSION: Worsening aeration at the left base at least partially from increasing pleural fluid. No pneumothorax. Electronically Signed   By: Jorje Guild M.D.   On: 09/05/2021 06:23   ECHOCARDIOGRAM  COMPLETE  Result Date: 09/03/2021    ECHOCARDIOGRAM REPORT   Patient Name:   OREOLUWA GILMER Date of Exam: 09/03/2021 Medical Rec #:  [id number]          Height:       62.0 in Accession #:    [id number]         Weight:       150.0 lb Date of Birth:  02/25/1946          BSA:          1.692 m Patient Age:    47 years           BP:           104/57 mmHg Patient Gender: F                  HR:           79 bpm. Exam Location:  Inpatient Procedure: 2D Echo, Cardiac Doppler and Color Doppler Indications:    Pleural effusion on left [[id number]]  History:        Patient has prior history of Echocardiogram examinations, most                 recent 05/15/2018. Signs/Symptoms:Shortness of Breath.  Sonographer:    Merrie Roof RDCS Referring Phys: [place]  1. Asymmetric septal hypertophy. late peaking mild LVOT obstruction. Anterior leaflet appears prolonged, no sig SAM. Consistent with HOCM. Recommend cardiac MRI if clinically indicated. Left ventricular ejection fraction, by estimation, is 60 to 65%. The left ventricle has normal function. Left ventricular diastolic parameters are consistent with Grade I diastolic dysfunction (impaired relaxation).  2. Right ventricular systolic function is normal. The right ventricular size is normal. There is normal pulmonary artery systolic pressure.  3. The mitral valve was not well visualized. No evidence of mitral valve regurgitation.  4. The aortic valve is grossly normal. Aortic valve regurgitation is not visualized.  5. The inferior vena cava is normal in size with greater than 50% respiratory variability, suggesting right atrial pressure of 3 mmHg. Comparison(s): No significant change from prior study. FINDINGS  Left Ventricle: Asymmetric septal hypertophy. late peaking mild LVOT obstruction. Anterior leaflet appears prolonged, no sig SAM. Consistent with HOCM. Recommend cardiac MRI if clinically indicated. Left ventricular ejection fraction, by estimation, is 60 to  65%. The left ventricle has normal  function. The left ventricular internal cavity size was normal in size. Left ventricular diastolic parameters are consistent with Grade I diastolic dysfunction (impaired relaxation). Right Ventricle: The right ventricular size is normal. No increase in right ventricular wall thickness. Right ventricular systolic function is normal. There is normal pulmonary artery systolic pressure. The tricuspid regurgitant velocity is 2.81 m/s, and  with an assumed right atrial pressure of 3 mmHg, the estimated right ventricular systolic pressure is 99.8 mmHg. Left Atrium: Left atrial size was normal in size. Right Atrium: Right atrial size was normal in size. Pericardium: There is no evidence of pericardial effusion. Mitral Valve: The mitral valve was not well visualized. No evidence of mitral valve regurgitation. Tricuspid Valve: The tricuspid valve is not well visualized. Tricuspid valve regurgitation is mild. Aortic Valve: The aortic valve is grossly normal. Aortic valve regurgitation is not visualized. Aortic valve mean gradient measures 6.0 mmHg. Aortic valve peak gradient measures 10.9 mmHg. Aortic valve area, by VTI measures 2.10 cm. Pulmonic Valve: The pulmonic valve was not well visualized. Pulmonic valve regurgitation is trivial. Aorta: The aortic root and ascending aorta are structurally normal, with no evidence of dilitation. Venous: The inferior vena cava is normal in size with greater than 50% respiratory variability, suggesting right atrial pressure of 3 mmHg. IAS/Shunts: The interatrial septum was not well visualized.  LEFT VENTRICLE PLAX 2D LVIDd:         3.40 cm   Diastology LV PW:         1.00 cm   LV e' medial:    5.98 cm/s LV IVS:        1.00 cm   LV E/e' medial:  11.2 LVOT diam:     1.90 cm   LV e' lateral:   6.53 cm/s LV SV:         65        LV E/e' lateral: 10.2 LV SV Index:   38 LVOT Area:     2.84 cm                           3D Volume EF:                          3D  EF:        60 %                          LV EDV:       72 ml                          LV ESV:       29 ml                          LV SV:        43 ml RIGHT VENTRICLE RV Basal diam:  2.70 cm LEFT ATRIUM             Index        RIGHT ATRIUM           Index LA diam:        2.10 cm 1.24 cm/m   RA Area:     11.40 cm LA Vol (A2C):   62.3 ml 36.83 ml/m  RA Volume:   24.90 ml  14.72 ml/m LA Vol (  A4C):   35.3 ml 20.87 ml/m LA Biplane Vol: 46.9 ml 27.72 ml/m  AORTIC VALVE AV Area (Vmax):    2.18 cm AV Area (Vmean):   2.05 cm AV Area (VTI):     2.10 cm AV Vmax:           165.00 cm/s AV Vmean:          110.000 cm/s AV VTI:            0.308 m AV Peak Grad:      10.9 mmHg AV Mean Grad:      6.0 mmHg LVOT Vmax:         127.00 cm/s LVOT Vmean:        79.500 cm/s LVOT VTI:          0.228 m LVOT/AV VTI ratio: 0.74  AORTA Ao Root diam: 3.00 cm Ao Asc diam:  2.50 cm MITRAL VALVE               TRICUSPID VALVE MV Area (PHT): 3.72 cm    TR Peak grad:   31.6 mmHg MV Decel Time: 204 msec    TR Vmax:        281.00 cm/s MV E velocity: 66.90 cm/s MV A velocity: 85.20 cm/s  SHUNTS MV E/A ratio:  0.79        Systemic VTI:  0.23 m                            Systemic Diam: 1.90 cm Phineas Inches Electronically signed by Phineas Inches Signature Date/Time: 09/03/2021/2:49:14 PM    Final      LOS: 2 days   Oren Binet, MD  [facility]    To contact the attending provider between 7A-7P or the covering provider during after hours 7P-7A, please log into the web site [email or web address] and access using universal [place] password for that web site. If you do not have the password, please call the hospital operator.  09/05/2021, 11:52 AM

## 2021-09-06 DIAGNOSIS — N179 Acute kidney failure, unspecified: Secondary | ICD-10-CM | POA: Diagnosis not present

## 2021-09-06 DIAGNOSIS — Z9689 Presence of other specified functional implants: Secondary | ICD-10-CM | POA: Diagnosis not present

## 2021-09-06 DIAGNOSIS — D649 Anemia, unspecified: Secondary | ICD-10-CM | POA: Diagnosis not present

## 2021-09-06 DIAGNOSIS — J90 Pleural effusion, not elsewhere classified: Secondary | ICD-10-CM | POA: Diagnosis not present

## 2021-09-06 LAB — BODY FLUID CULTURE W GRAM STAIN: Culture: NO GROWTH

## 2021-09-06 LAB — GLUCOSE, CAPILLARY
Glucose-Capillary: 157 mg/dL — ABNORMAL HIGH (ref 70–99)
Glucose-Capillary: 156 mg/dL — ABNORMAL HIGH (ref 70–99)
Glucose-Capillary: 257 mg/dL — ABNORMAL HIGH (ref 70–99)
Glucose-Capillary: 189 mg/dL — ABNORMAL HIGH (ref 70–99)

## 2021-09-06 NOTE — Progress Notes (Signed)
° °  NAME:  Heather Rogers, MRN:  [id number], DOB:  05/23/1946, LOS: 3 ADMISSION DATE:  09/02/2021, CONSULTATION DATE:  09/03/21 REFERRING MD:  Sloan Leiter, CHIEF COMPLAINT:  sob   History of Present Illness:  76 year old woman with distant hx of smoking presenting with 1 week of worsening SOB, initially just with exertion now at rest.  Imaging in ER shows complete opacification of L hemithorax.  PCCM consulted for management.  Patient denies fevers, chills, pleurisy, cough, trauma, prior chest surgeries.  Does have 1-2 day hx of nausea and dry heaving.  Pertinent  Medical History  DM2 HTN CVA  Significant Hospital Events: Including procedures, antibiotic start and stop dates in addition to other pertinent events   09/03/21 consulted 2/6 Documented output from CT since insertion is 2,525  Interim History / Subjective:  Renal side of bed brushing teeth Objective   Blood pressure 112/60, pulse 85, temperature 99.9 F (37.7 C), temperature source Oral, resp. rate 15, height 5\' 2"  (1.575 m), weight 67 kg, SpO2 97 %.        Intake/Output Summary (Last 24 hours) at 09/06/2021 1144 Last data filed at 09/05/2021 1300 Gross per 24 hour  Intake --  Output 300 ml  Net -300 ml   Filed Weights   09/02/21 1827 09/04/21 0555 09/06/21 0543  Weight: 68 kg 64.8 kg 67 kg    Examination: General: Obese female sitting on side of bed no acute HEENT: MM pink/moist no JVD or lymphadenopathy is appreciated Neuro: Grossly intact without focal defect CV: Heart sounds are distant PULM: Diminished in the bases left greater than right  GI: soft, bsx4 active  GU: Extremities: warm/dry, 1+ edema  Skin: no rashes or lesions   Resolved Hospital Problem list   N/A  Assessment & Plan:  Abnormal CXR - large effusion on [place], no infectious s/s.  Hx of smoking in distant past.  Concerned for malignancy.   P: Continue to monitor No documented chest tube drainage last 24 to 36 hours Mobilize as  able Pulmonary toilet Follow cytology Questionable timing AS TO chest tube removal   Best Practice (right click and "Reselect all SmartList Selections" daily)  Per primary   Critical care: NA  Rogers Landry Arielys [place] ACNP Acute Care Nurse Practitioner [place] Please consult Dorris 09/06/2021, 11:44 AM

## 2021-09-06 NOTE — Care Management Important Message (Signed)
Important Message  Patient Details  Name: Heather Rogers MRN: [id number] Date of Birth: 1946/02/19   Medicare Important Message Given:  Yes     Orbie Pyo 09/06/2021, 2:10 PM

## 2021-09-06 NOTE — Progress Notes (Signed)
Physical Therapy Treatment Patient Details Name: Heather Rogers MRN: [id number] DOB: May 07, 1946 Today's Date: 09/06/2021   History of Present Illness 76 y.o. female presents to [facility] on 09/02/2021 with SOB, found to have a large left-sided pleural effusion. Pt underwent chest tube placement on 09/03/2021. PMH includes CVA, DMII, HTN.    PT Comments    Patient progressing towards physical therapy goals. Patient ambulated 150' with no AD and supervision for line management. VSS on RA and patient reporting feeling better than day before. Drainage noted to chest tube insertion site and tubing connection, RN notified. No PT follow up recommended at this time.     Recommendations for follow up therapy are one component of a multi-disciplinary discharge planning process, led by the attending physician.  Recommendations may be updated based on patient status, additional functional criteria and insurance authorization.  Follow Up Recommendations  No PT follow up     Assistance Recommended at Discharge PRN  Patient can return home with the following A little help with walking and/or transfers;Help with stairs or ramp for entrance;A little help with bathing/dressing/bathroom   Equipment Recommendations  None recommended by PT    Recommendations for Other Services       Precautions / Restrictions Precautions Precautions: Fall Precaution Comments: monitor SpO2, chest tube Restrictions Weight Bearing Restrictions: No     Mobility  Bed Mobility               General bed mobility comments: up in room on arrival after bathing with NT    Transfers Overall transfer level: Needs assistance Equipment used: None Transfers: Sit to/from Stand Sit to Stand: Supervision           General transfer comment: supervision for safety. In static standing, patient with LOB x 2 posteriorly but able to self correct    Ambulation/Gait Ambulation/Gait assistance: Supervision Gait  Distance (Feet): 150 Feet Assistive device: None Gait Pattern/deviations: Step-through pattern Gait velocity: reduced     General Gait Details: supervision for safety   Stairs             Wheelchair Mobility    Modified Rankin (Stroke Patients Only)       Balance Overall balance assessment: Mild deficits observed, not formally tested                                          Cognition Arousal/Alertness: Awake/alert Behavior During Therapy: WFL for tasks assessed/performed Overall Cognitive Status: Within Functional Limits for tasks assessed                                          Exercises      General Comments General comments (skin integrity, edema, etc.): VSS on RA. Drainage noted from chest tube site and tube connection, RN notified      Pertinent Vitals/Pain Pain Assessment Pain Assessment: Faces Faces Pain Scale: No hurt Pain Intervention(s): Monitored during session    Home Living                          Prior Function            PT Goals (current goals can now be found in the care plan section) Acute Rehab PT Goals Patient  Stated Goal: to go home and return to prior level of function PT Goal Formulation: With patient Time For Goal Achievement: 09/18/21 Potential to Achieve Goals: Good Progress towards PT goals: Progressing toward goals    Frequency    Min 3X/week      PT Plan Current plan remains appropriate    Co-evaluation              AM-PAC PT "6 Clicks" Mobility   Outcome Measure  Help needed turning from your back to your side while in a flat bed without using bedrails?: None Help needed moving from lying on your back to sitting on the side of a flat bed without using bedrails?: None Help needed moving to and from a bed to a chair (including a wheelchair)?: A Little Help needed standing up from a chair using your arms (e.g., wheelchair or bedside chair)?: A Little Help  needed to walk in hospital room?: A Little Help needed climbing 3-5 steps with a railing? : A Little 6 Click Score: 20    End of Session   Activity Tolerance: Patient tolerated treatment well Patient left: in chair;with call bell/phone within reach;with family/visitor present Nurse Communication: Mobility status PT Visit Diagnosis: Other abnormalities of gait and mobility (R26.89)     Time: 8889-1694 PT Time Calculation (min) (ACUTE ONLY): 24 min  Charges:  $Gait Training: 23-37 mins                     [name] A. Gilford Rile PT, DPT Acute Rehabilitation Services Pager [phone] Office [phone]    Linna Hoff 09/06/2021, 12:32 PM

## 2021-09-06 NOTE — Progress Notes (Signed)
PROGRESS NOTE        PATIENT DETAILS Name: Heather Rogers Age: 76 y.o. Sex: female Date of Birth: [birth year] Admit Date: 09/02/2021 Admitting Physician Evalee Mutton Kristeen Mans, MD WER:[id number], Charlane Ferretti, MD  Brief Summary: Patient is a 76 y.o.  female CVA with right hemiparesis, DM-2, HTN-presenting with shortness of breath-found to have a large left-sided pleural effusion.  Further work-up revealed exudative pleural effusion- likely due to metastatic breast cancer.  See below for further details  Significant Hospital events: 2/4>> admit to [facility] for shortness of breath-found to have large left sided pleural effusion.  Significant imaging studies: 2/4>> CXR: Left lung whiteout 2/5>> bilateral lower extremity Doppler: Negative 2/5>> Echo: EF 60-65%, asymmetric septal hypertrophy-consistent with HOCUM 2/5>> CT chest: 10 cm left breast mass, axillary/mediastinal lymphadenopathy, suspicion for lymphangitic spread, blastic/lytic lesions throughout the sternum.  2.1 cm dense right thyroid lobe nodule. 2/7>> CXR: Worsening left pleural effusion  Significant microbiology data: 2/4>> FLU/COVID PCR: Negative 2/5>> pleural fluid cultures: No growth  Cytology/pathology data: 2/5>> pleural fluid cytology: Malignant cells consistent with metastatic adenocarcinoma  Procedures: 2/5>> chest tube insertion  Consults:  PCCM, oncology  Subjective: Lying comfortably in bed-no major issues overnight  Objective: Vitals: Blood pressure (!) 100/57, pulse 85, temperature 98.3 F (36.8 C), temperature source Oral, resp. rate 15, height 5\' 2"  (1.575 m), weight 67 kg, SpO2 97 %.   Exam: Gen Exam:Alert awake-not in any distress HEENT:atraumatic, normocephalic Chest: B/L clear to auscultation anteriorly CVS:S1S2 regular Abdomen:soft non tender, non distended Extremities:no edema Neurology: Non focal Skin: no rash    Pertinent Labs/Radiology: CBC Latest Ref Rng & Units  09/04/2021 09/03/2021 09/02/2021  WBC 4.0 - 10.5 K/uL 6.1 7.7 8.9  Hemoglobin 12.0 - 15.0 g/dL 8.8(L) 9.7(L) 9.6(L)  Hematocrit 36.0 - 46.0 % 28.6(L) 30.6(L) 30.7(L)  Platelets 150 - 400 K/uL 392 345 489(H)    Lab Results  Component Value Date   NA 137 09/05/2021   K 4.0 09/05/2021   CL 107 09/05/2021   CO2 20 (L) 09/05/2021      Assessment/Plan: * Pleural effusion on left- (present on admission) S/p left-sided pigtail chest tube placement by PCCM on 2/5.  Pleural fluid analysis consistent with an exudate-given findings on CT chest-highly suspicion that this is due to stage IV breast cancer.  Cytology has come back positive for metastatic adenocarcinoma.  Since pleural effusion worsened after clamping chest tube-PCCM planning on Pleurx catheter insertion prior to discharge.  Will await further recommendations from PCCM.    Breast mass, left- (present on admission) See CT chest findings-along with pleural effusion-highly suspicious that this is all due to metastatic breast cancer.  Cytology from pleural fluid consistent with metastatic adenocarcinoma as well.  Appreciate oncology input.  AKI (acute kidney injury) ([place]) Likely hemodynamically mediated creatinine slowly downtrending-close to baseline.  Microcytic anemia- (present on admission) Appears to be a chronic issue-iron panel not suggestive of iron deficiency anemia.  Defer further to the outpatient setting.Marland Kitchen  HOCM (hypertrophic obstructive cardiomyopathy) ([place])- (present on admission) New finding on echo-already on beta-blocker.  Avoid excessive reduction of preload.  Will need outpatient cardiology evaluation.  Type 2 diabetes mellitus (A1c 6.4 on 2/5) - (present on admission) CBG stable with SSI.  Resume on discharge.   Recent Labs    09/05/21 2126 09/06/21 0827 09/06/21 1229  GLUCAP 81 157* 156*  Hypertension associated with diabetes ([place])- (present on admission) BP controlled-continue Coreg-no longer on  amlodipine.  Dyslipidemia- (present on admission) Continue statin.  Gout- (present on admission) Stable/no flare-continue allopurinol.  History of CVA Appears to move all 4 extremities symmetrically-on aspirin/statin.  Cognitive decline- (present on admission) Daughter acknowledges some cognitive dysfunction at baseline-after CVA-needs formal neuropsychological testing in the outpatient setting.  May have some element of dementia/vascular dementia at baseline.  Positive D dimer Not hypoxic-unclear significance-lower extremity Dopplers are negative-doubt further work-up is required at this point.  Coagulopathy ([place])- (present on admission) dtr reports pt was on Eliquis research study protocol but stopped taking any research meds 3 weeks ago. Pt only takes 81 mg ASA at this point.   BMI: Estimated body mass index is 27.02 kg/m as calculated from the following:   Height as of this encounter: 5\' 2"  (1.575 m).   Weight as of this encounter: 67 kg.   Code status:   Code Status: Full Code   DVT Prophylaxis: heparin injection 5,000 Units Start: 09/03/21 2200 SCDs Start: 09/02/21 2259   Family Communication: Daughter-[name] Dogget-[phone] left voicemail on 2/8   Disposition Plan: Status is: Observation The patient will require care spanning > 2 midnights and should be moved to inpatient because: Chest tube in place-awaiting further work-up-not yet stable for discharge.   Planned Discharge Destination:Home   Diet: Diet Order             Diet heart healthy/carb modified Room service appropriate? Yes; Fluid consistency: Thin  Diet effective now                     Antimicrobial agents: Anti-infectives (From admission, onward)    Start     Dose/Rate Route Frequency Ordered Stop   09/02/21 2030  cefTRIAXone (ROCEPHIN) 1 g in sodium chloride 0.9 % 100 mL IVPB        1 g 200 mL/hr over 30 Minutes Intravenous  Once 09/02/21 2027 09/02/21 2138   09/02/21 2030   azithromycin (ZITHROMAX) 500 mg in sodium chloride 0.9 % 250 mL IVPB        500 mg 250 mL/hr over 60 Minutes Intravenous  Once 09/02/21 2027 09/02/21 2238        MEDICATIONS: Scheduled Meds:  allopurinol  100 mg Oral Daily   aspirin EC  81 mg Oral Daily   atorvastatin  80 mg Oral QHS   carvedilol  25 mg Oral BID WC   gabapentin  100 mg Oral TID   heparin  5,000 Units Subcutaneous Q8H   insulin aspart  0-5 Units Subcutaneous QHS   insulin aspart  0-9 Units Subcutaneous TID WC   Continuous Infusions: PRN Meds:.acetaminophen **OR** acetaminophen, albuterol, melatonin, morphine injection, ondansetron **OR** ondansetron (ZOFRAN) IV   I have personally reviewed following labs and imaging studies  LABORATORY DATA: CBC: Recent Labs  Lab 09/02/21 1840 09/03/21 0037 09/04/21 0350  WBC 8.9 7.7 6.1  NEUTROABS  --  5.8  --   HGB 9.6* 9.7* 8.8*  HCT 30.7* 30.6* 28.6*  MCV 71.9* 70.8* 71.9*  PLT 489* 345 392     Basic Metabolic Panel: Recent Labs  Lab 09/02/21 1840 09/03/21 0037 09/03/21 0759 09/04/21 0350 09/05/21 0127  NA 136  --  139 138 137  K 5.1  --  4.5 4.1 4.0  CL 103  --  109 106 107  CO2 18*  --  20* 21* 20*  GLUCOSE 161*  --  95 116* 153*  BUN 70*  --  63* 60* 52*  CREATININE 1.61* 1.49* 1.54* 1.78* 1.39*  CALCIUM 9.6  --  9.0 8.9 9.0  MG  --  2.6*  --   --   --      GFR: Estimated Creatinine Clearance: 31.4 mL/min (A) (by C-G formula based on SCr of 1.39 mg/dL (H)).  Liver Function Tests: Recent Labs  Lab 09/03/21 0759 09/04/21 0350  AST  --  64*  ALT  --  61*  ALKPHOS  --  50  BILITOT  --  0.2*  PROT 6.5 6.1*  ALBUMIN  --  2.2*    No results for input(s): LIPASE, AMYLASE in the last 168 hours. No results for input(s): AMMONIA in the last 168 hours.  Coagulation Profile: No results for input(s): INR, PROTIME in the last 168 hours.  Cardiac Enzymes: No results for input(s): CKTOTAL, CKMB, CKMBINDEX, TROPONINI in the last 168  hours.  BNP (last 3 results) No results for input(s): PROBNP in the last 8760 hours.  Lipid Profile: No results for input(s): CHOL, HDL, LDLCALC, TRIG, CHOLHDL, LDLDIRECT in the last 72 hours.  Thyroid Function Tests: No results for input(s): TSH, T4TOTAL, FREET4, T3FREE, THYROIDAB in the last 72 hours.   Anemia Panel: No results for input(s): VITAMINB12, FOLATE, FERRITIN, TIBC, IRON, RETICCTPCT in the last 72 hours.   Urine analysis:    Component Value Date/Time   COLORURINE YELLOW 07/25/2020 1057   APPEARANCEUR HAZY (A) 07/25/2020 1057   LABSPEC 1.017 07/25/2020 1057   PHURINE 5.0 07/25/2020 1057   GLUCOSEU NEGATIVE 07/25/2020 1057   HGBUR SMALL (A) 07/25/2020 1057   BILIRUBINUR NEGATIVE 07/25/2020 1057   KETONESUR 5 (A) 07/25/2020 1057   PROTEINUR 30 (A) 07/25/2020 1057   UROBILINOGEN 0.2 12/20/2009 1049   NITRITE NEGATIVE 07/25/2020 1057   LEUKOCYTESUR LARGE (A) 07/25/2020 1057    Sepsis Labs: Lactic Acid, Venous No results found for: LATICACIDVEN  MICROBIOLOGY: Recent Results (from the past 240 hour(s))  Resp Panel by RT-PCR (Flu A&B, Covid) Nasopharyngeal Swab     Status: None   Collection Time: 09/02/21  6:40 PM   Specimen: Nasopharyngeal Swab; Nasopharyngeal(NP) swabs in vial transport medium  Result Value Ref Range Status   SARS Coronavirus 2 by RT PCR NEGATIVE NEGATIVE Final    Comment: (NOTE) SARS-CoV-2 target nucleic acids are NOT DETECTED.  The SARS-CoV-2 RNA is generally detectable in upper respiratory specimens during the acute phase of infection. The lowest concentration of SARS-CoV-2 viral copies this assay can detect is 138 copies/mL. A negative result does not preclude SARS-Cov-2 infection and should not be used as the sole basis for treatment or other patient management decisions. A negative result may occur with  improper specimen collection/handling, submission of specimen other than nasopharyngeal swab, presence of viral mutation(s) within  the areas targeted by this assay, and inadequate number of viral copies(<138 copies/mL). A negative result must be combined with clinical observations, patient history, and epidemiological information. The expected result is Negative.  Fact Sheet for Patients:  EntrepreneurPulse.com.au  Fact Sheet for Healthcare Providers:  IncredibleEmployment.be  This test is no t yet approved or cleared by the [place] FDA and  has been authorized for detection and/or diagnosis of SARS-CoV-2 by FDA under an Emergency Use Authorization (EUA). This EUA will remain  in effect (meaning this test can be used) for the duration of the COVID-19 declaration under Section 564(b)(1) of the Act, 21 U.S.C.section 360bbb-3(b)(1), unless the authorization is terminated  or revoked  sooner.       Influenza A by PCR NEGATIVE NEGATIVE Final   Influenza B by PCR NEGATIVE NEGATIVE Final    Comment: (NOTE) The Xpert Xpress SARS-CoV-2/FLU/RSV plus assay is intended as an aid in the diagnosis of influenza from Nasopharyngeal swab specimens and should not be used as a sole basis for treatment. Nasal washings and aspirates are unacceptable for Xpert Xpress SARS-CoV-2/FLU/RSV testing.  Fact Sheet for Patients: EntrepreneurPulse.com.au  Fact Sheet for Healthcare Providers: IncredibleEmployment.be  This test is not yet approved or cleared by the [place] FDA and has been authorized for detection and/or diagnosis of SARS-CoV-2 by FDA under an Emergency Use Authorization (EUA). This EUA will remain in effect (meaning this test can be used) for the duration of the COVID-19 declaration under Section 564(b)(1) of the Act, 21 U.S.C. section 360bbb-3(b)(1), unless the authorization is terminated or revoked.  Performed at [facility], [place] [address], [place], [place] [address]   Body fluid culture w Gram Stain     Status: None    Collection Time: 09/03/21  8:30 AM   Specimen: Pleural Fluid  Result Value Ref Range Status   Specimen Description PLEURAL FLUID  Final   Special Requests LEFT  Final   Gram Stain   Final    WBC PRESENT,BOTH PMN AND MONONUCLEAR NO ORGANISMS SEEN CYTOSPIN SMEAR    Culture   Final    NO GROWTH Performed at [facility], [place] [address] [place], [place] [address]    Report Status 09/06/2021 FINAL  Final    RADIOLOGY STUDIES/RESULTS: DG CHEST PORT 1 VIEW  Result Date: 09/05/2021 CLINICAL DATA:  Chest tube EXAM: PORTABLE CHEST 1 VIEW COMPARISON:  Two days ago FINDINGS: Increased hazy opacification of the left chest. Pleural fluid is seen along the lateral lung edge. Normal heart size and mediastinal contours given rotation. Implantable loop recorder over the left chest. Clear right lung. IMPRESSION: Worsening aeration at the left base at least partially from increasing pleural fluid. No pneumothorax. Electronically Signed   By: Jorje Guild M.D.   On: 09/05/2021 06:23     LOS: 3 days   Oren Binet, MD  [facility]    To contact the attending provider between 7A-7P or the covering provider during after hours 7P-7A, please log into the web site [email or web address] and access using universal [place] password for that web site. If you do not have the password, please call the hospital operator.  09/06/2021, 2:21 PM

## 2021-09-07 ENCOUNTER — Encounter (HOSPITAL_COMMUNITY)
Admission: EM | Disposition: A | Discharge status: Home / Self Care | Payer: Self-pay | Source: Home / Self Care | Attending: Internal Medicine

## 2021-09-07 ENCOUNTER — Telehealth: Payer: Self-pay | Admitting: Student

## 2021-09-07 ENCOUNTER — Telehealth: Payer: Self-pay | Admitting: *Deleted

## 2021-09-07 ENCOUNTER — Inpatient Hospital Stay (HOSPITAL_COMMUNITY): Payer: Medicare HMO

## 2021-09-07 DIAGNOSIS — Z9689 Presence of other specified functional implants: Secondary | ICD-10-CM | POA: Diagnosis not present

## 2021-09-07 DIAGNOSIS — J90 Pleural effusion, not elsewhere classified: Secondary | ICD-10-CM | POA: Diagnosis not present

## 2021-09-07 DIAGNOSIS — D649 Anemia, unspecified: Secondary | ICD-10-CM | POA: Diagnosis not present

## 2021-09-07 DIAGNOSIS — N179 Acute kidney failure, unspecified: Secondary | ICD-10-CM | POA: Diagnosis not present

## 2021-09-07 HISTORY — PX: CHEST TUBE INSERTION: SHX231

## 2021-09-07 LAB — BASIC METABOLIC PANEL
Anion gap: 9 (ref 5–15)
BUN: 38 mg/dL — ABNORMAL HIGH (ref 8–23)
CO2: 21 mmol/L — ABNORMAL LOW (ref 22–32)
Calcium: 8.8 mg/dL — ABNORMAL LOW (ref 8.9–10.3)
Chloride: 106 mmol/L (ref 98–111)
Creatinine, Ser: 1.2 mg/dL — ABNORMAL HIGH (ref 0.44–1.00)
GFR, Estimated: 47 mL/min — ABNORMAL LOW (ref >60)
Glucose, Bld: 152 mg/dL — ABNORMAL HIGH (ref 70–99)
Potassium: 4.2 mmol/L (ref 3.5–5.1)
Sodium: 136 mmol/L (ref 135–145)

## 2021-09-07 LAB — GLUCOSE, CAPILLARY
Glucose-Capillary: 171 mg/dL — ABNORMAL HIGH (ref 70–99)
Glucose-Capillary: 197 mg/dL — ABNORMAL HIGH (ref 70–99)
Glucose-Capillary: 197 mg/dL — ABNORMAL HIGH (ref 70–99)
Glucose-Capillary: 233 mg/dL — ABNORMAL HIGH (ref 70–99)
Glucose-Capillary: 240 mg/dL — ABNORMAL HIGH (ref 70–99)

## 2021-09-07 LAB — CBC WITH DIFFERENTIAL/PLATELET
Abs Immature Granulocytes: 0.03 10*3/uL (ref 0.00–0.07)
Basophils Absolute: 0 10*3/uL (ref 0.0–0.1)
Basophils Relative: 1 %
Eosinophils Absolute: 0.1 10*3/uL (ref 0.0–0.5)
Eosinophils Relative: 1 %
HCT: 26.4 % — ABNORMAL LOW (ref 36.0–46.0)
Hemoglobin: 8.5 g/dL — ABNORMAL LOW (ref 12.0–15.0)
Immature Granulocytes: 1 %
Lymphocytes Relative: 21 %
Lymphs Abs: 1.4 10*3/uL (ref 0.7–4.0)
MCH: 22.8 pg — ABNORMAL LOW (ref 26.0–34.0)
MCHC: 32.2 g/dL (ref 30.0–36.0)
MCV: 70.8 fL — ABNORMAL LOW (ref 80.0–100.0)
Monocytes Absolute: 1.1 10*3/uL — ABNORMAL HIGH (ref 0.1–1.0)
Monocytes Relative: 17 %
Neutro Abs: 3.9 10*3/uL (ref 1.7–7.7)
Neutrophils Relative %: 59 %
Platelets: 393 10*3/uL (ref 150–400)
RBC: 3.73 MIL/uL — ABNORMAL LOW (ref 3.87–5.11)
RDW: 14.5 % (ref 11.5–15.5)
WBC: 6.6 10*3/uL (ref 4.0–10.5)
nRBC: 0 % (ref 0.0–0.2)

## 2021-09-07 LAB — PHOSPHORUS: Phosphorus: 2.7 mg/dL (ref 2.5–4.6)

## 2021-09-07 LAB — MAGNESIUM: Magnesium: 2.5 mg/dL — ABNORMAL HIGH (ref 1.7–2.4)

## 2021-09-07 SURGERY — CHEST TUBE INSERTION
Anesthesia: LOCAL | Laterality: Left

## 2021-09-07 MED ORDER — ADULT MULTIVITAMIN W/MINERALS CH
1.0000 | ORAL_TABLET | Freq: Every day | ORAL | Status: DC
  Administered 2021-09-07 – 2021-09-08 (×2): 1 via ORAL
  Filled 2021-09-07 (×2): qty 1

## 2021-09-07 MED ORDER — HEPARIN SODIUM (PORCINE) 5000 UNIT/ML IJ SOLN
5000.0000 [IU] | Freq: Three times a day (TID) | INTRAMUSCULAR | Status: DC
  Administered 2021-09-07 – 2021-09-08 (×2): 5000 [IU] via SUBCUTANEOUS
  Filled 2021-09-07 (×2): qty 1

## 2021-09-07 MED ORDER — LIDOCAINE HCL 2 % IJ SOLN
INTRAMUSCULAR | Status: AC
  Filled 2021-09-07: qty 20

## 2021-09-07 MED ORDER — FENTANYL CITRATE (PF) 100 MCG/2ML IJ SOLN
50.0000 ug | Freq: Once | INTRAMUSCULAR | Status: AC
  Administered 2021-09-07: 50 ug via INTRAVENOUS

## 2021-09-07 MED ORDER — FENTANYL CITRATE (PF) 100 MCG/2ML IJ SOLN
INTRAMUSCULAR | Status: AC
  Filled 2021-09-07: qty 2

## 2021-09-07 MED ORDER — LIDOCAINE HCL (PF) 2 % IJ SOLN
INTRAMUSCULAR | Status: AC
  Filled 2021-09-07: qty 10

## 2021-09-07 MED ORDER — CEFAZOLIN SODIUM-DEXTROSE 2-4 GM/100ML-% IV SOLN
2.0000 g | INTRAVENOUS | Status: DC
  Filled 2021-09-07: qty 100

## 2021-09-07 MED ORDER — ENSURE ENLIVE PO LIQD
237.0000 mL | Freq: Two times a day (BID) | ORAL | Status: DC
  Administered 2021-09-07 – 2021-09-08 (×2): 237 mL via ORAL

## 2021-09-07 NOTE — Discharge Summary (Signed)
PATIENT DETAILS Name: Heather Rogers Age: 76 y.o. Sex: female Date of Birth: [birth year] MRN: [id number]. Admitting Physician: Maretta Bees, MD [id number], Odette Horns, MD  Admit Date: 09/02/2021 Discharge date: 09/08/2021  Recommendations for Outpatient Follow-up:  Follow up with PCP in 1-2 weeks Please obtain CMP/CBC in one week Please ensure follow-up with oncology, pulmonology and cardiology  Admitted From:  Home  Disposition: Home health   Discharge Condition: good  CODE STATUS:   Code Status: Full Code   Diet recommendation:  Diet Order             Diet heart healthy/carb modified Room service appropriate? Yes with Assist; Fluid consistency: Thin  Diet effective now           Diet - low sodium heart healthy           Diet general                    Brief Summary: Patient is a 76 y.o.  female CVA with right hemiparesis, DM-2, HTN-presenting with shortness of breath-found to have a large left-sided pleural effusion.  Further work-up revealed exudative pleural effusion- likely due to metastatic breast cancer.  See below for further details  Significant Hospital events: 2/4>> admit to [facility] for shortness of breath-found to have large left sided pleural effusion.  Significant imaging studies: 2/4>> CXR: Left lung whiteout 2/5>> bilateral lower extremity Doppler: Negative 2/5>> Echo: EF 60-65%, asymmetric septal hypertrophy-consistent with HOCUM 2/5>> CT chest: 10 cm left breast mass, axillary/mediastinal lymphadenopathy, suspicion for lymphangitic spread, blastic/lytic lesions throughout the sternum.  2.1 cm dense right thyroid lobe nodule. 2/7>> CXR: Worsening left pleural effusion  Significant microbiology data: 2/4>> FLU/COVID PCR: Negative 2/5>> pleural fluid cultures: No growth  Cytology/pathology data: 2/5>> pleural fluid cytology: Malignant cells consistent with metastatic adenocarcinoma  Procedures: 2/5>> chest tube insertion 2/9>> Pleurx  catheter insertion   Consults:  PCCM, oncology  Brief Hospital Course: * Pleural effusion on left- (present on admission) S/p left-sided pigtail chest tube placement by PCCM on 2/5.  Pleural fluid analysis consistent with an exudate-given findings on CT chest-highly suspicion that this is due to stage IV breast cancer.  Cytology has come back positive for metastatic adenocarcinoma.  Since pleural effusion worsened after clamping chest tube-PCCM inserted Pleurx catheter on 2/9.  PCCM will arrange for outpatient follow-up.  Breast mass, left- (present on admission) See CT chest findings-along with pleural effusion-highly suspicious that this is all due to metastatic breast cancer.  Cytology from pleural fluid consistent with metastatic adenocarcinoma as well.  Appreciate oncology input-oncology will arrange for outpatient follow-up.  AKI (acute kidney injury) (HCC) Likely hemodynamically mediated creatinine slowly downtrending-close to baseline.  Microcytic anemia- (present on admission) Appears to be a chronic issue-iron panel not suggestive of iron deficiency anemia.  Defer further to the outpatient setting.Marland Kitchen  HOCM (hypertrophic obstructive cardiomyopathy) (HCC)- (present on admission) New finding on echo-already on beta-blocker.  No history of syncope.  Avoid excessive reduction of preload.  Will need outpatient cardiology evaluation (spoke with cardiology coordinator-Trish for outpatient appointment).  Type 2 diabetes mellitus (A1c 6.4 on 2/5) - (present on admission) CBG stable with SSI.  Resume metformin on discharge.   Recent Labs    09/06/21 1655 09/06/21 2047 09/07/21 0742  GLUCAP 257* 189* 171*    Hypertension associated with diabetes (HCC)- (present on admission) BP controlled-continue Coreg-no longer on amlodipine/valsartan and HCTZ.  Avoid diuretics unless she develops volume overload-she has HOCM and will  need to watch for excessive preload reduction.  Dyslipidemia-  (present on admission) Continue statin.  Gout- (present on admission) Stable/no flare-continue allopurinol.  History of CVA Appears to move all 4 extremities symmetrically-on aspirin/statin.  Cognitive decline- (present on admission) Daughter acknowledges some cognitive dysfunction at baseline-after CVA-needs formal neuropsychological testing in the outpatient setting.  May have some element of dementia/vascular dementia at baseline.  Positive D dimer Not hypoxic-unclear significance-lower extremity Dopplers are negative-doubt further work-up is required at this point.  Coagulopathy ([place])- (present on admission) dtr reports pt was on Eliquis research study protocol but stopped taking any research meds 3 weeks ago. Pt only takes 81 mg ASA at this point.   BMI: Estimated body mass index is 25.44 kg/m as calculated from the following:   Height as of this encounter: $RemoveBeforeD'5\' 2"'HILELLvwyNMEwJ$  (1.575 m).   Weight as of this encounter: 63.1 kg.    Discharge Diagnoses:  Principal Problem:   Pleural effusion on left Active Problems:   AKI (acute kidney injury) ([place])   Breast mass, left   Microcytic anemia   Type 2 diabetes mellitus (A1c 6.4 on 2/5)    HOCM (hypertrophic obstructive cardiomyopathy) ([place])   Hypertension associated with diabetes ([place])   Dyslipidemia   Gout   History of CVA   Positive D dimer   Cognitive decline   Coagulopathy (HCC)   Pleural effusion   Discharge Instructions:  Activity:  As tolerated with Full fall precautions use walker/cane & assistance as needed  Discharge Instructions     Ambulatory Pleural Drainage Schedule   Complete by: As directed    would start draining every other day up to 1L maximum or until develops chest tightness (start drainage 09/09/21). If drains >300cc then continue draining every other day. If drains <300cc then drain every 3 days. If drains <100cc then drain every 4 days. If drains <50cc on two occasions then call [facility] at  [phone] or send my chart message so we can evaluate for possible removal or troubleshooting.   Ambulatory referral to Hematology / Oncology   Complete by: As directed    Seen by Dr. Krista Blue in the hospital-needs outpatient follow-up for newly diagnosed breast cancer.   Call MD for:  difficulty breathing, headache or visual disturbances   Complete by: As directed    Diet - low sodium heart healthy   Complete by: As directed    Diet general   Complete by: As directed    Discharge instructions   Complete by: As directed    Follow with Primary MD  Charlott Rakes, MD in 1-2 weeks  Please get a complete blood count and chemistry panel checked by your Primary MD at your next visit, and again as instructed by your Primary MD.  Get Medicines reviewed and adjusted: Please take all your medications with you for your next visit with your Primary MD  Laboratory/radiological data: Please request your Primary MD to go over all hospital tests and procedure/radiological results at the follow up, please ask your Primary MD to get all Hospital records sent to his/her office.  In some cases, they will be blood work, cultures and biopsy results pending at the time of your discharge. Please request that your primary care M.D. follows up on these results.  Also Note the following: If you experience worsening of your admission symptoms, develop shortness of breath, life threatening emergency, suicidal or homicidal thoughts you must seek medical attention immediately by calling 911 or calling your MD immediately  if symptoms less severe.  You must read complete instructions/literature along with all the possible adverse reactions/side effects for all the Medicines you take and that have been prescribed to you. Take any new Medicines after you have completely understood and accpet all the possible adverse reactions/side effects.   Do not drive when taking Pain medications or sleeping medications  (Benzodaizepines)  Do not take more than prescribed Pain, Sleep and Anxiety Medications. It is not advisable to combine anxiety,sleep and pain medications without talking with your primary care practitioner  Special Instructions: If you have smoked or chewed Tobacco  in the last 2 yrs please stop smoking, stop any regular Alcohol  and or any Recreational drug use.  Wear Seat belts while driving.  Please note: You were cared for by a hospitalist during your hospital stay. Once you are discharged, your primary care physician will handle any further medical issues. Please note that NO REFILLS for any discharge medications will be authorized once you are discharged, as it is imperative that you return to your primary care physician (or establish a relationship with a primary care physician if you do not have one) for your post hospital discharge needs so that they can reassess your need for medications and monitor your lab values.   You will get a call from [facility]-if you do not hear from them-please give them a call.   Increase activity slowly   Complete by: As directed    No dressing needed   Complete by: As directed       Allergies as of 09/08/2021       Reactions   Sulfa Antibiotics    Itching        Medication List     STOP taking these medications    amLODipine 10 MG tablet Commonly known as: NORVASC   valsartan-hydrochlorothiazide 320-12.5 MG tablet Commonly known as: DIOVAN-HCT       TAKE these medications    Accu-Chek FastClix Lancets Misc Check blood sugar fasting and before meals and again if pt feels bad (symptoms of hypo).   Accu-Chek Guide test strip Generic drug: glucose blood USE TO CHECK FASTING BLOOD SUGAR AND BEFORE MEALS AND AGAIN IF PATIENT FEELS BAD; SYMPTOMS OF HYPOGLYCEMIA   Accu-Chek Guide w/Device Kit 1 each by Does not apply route as directed.   acetaminophen 325 MG tablet Commonly known as: TYLENOL Take 2 tablets (650 mg  total) by mouth every 6 (six) hours as needed for mild pain (or Fever >/= 101).   allopurinol 100 MG tablet Commonly known as: ZYLOPRIM TAKE ONE TABLET BY MOUTH DAILY   aspirin EC 81 MG tablet Take 81 mg by mouth daily. Swallow whole.   atorvastatin 80 MG tablet Commonly known as: LIPITOR TAKE ONE TABLET BY MOUTH EVERY EVENING TO LOWER CHOLESTEROL What changed:  how much to take how to take this when to take this additional instructions   carvedilol 25 MG tablet Commonly known as: COREG Take 1 tablet (25 mg total) by mouth 2 (two) times daily with a meal.   cholecalciferol 25 MCG (1000 UNIT) tablet Commonly known as: VITAMIN D3 Take 1,000 Units by mouth daily.   feeding supplement Liqd Take 237 mLs by mouth 2 (two) times daily between meals.   ferrous sulfate 325 (65 FE) MG EC tablet Take 1 tablet (325 mg total) by mouth in the morning and at bedtime.   gabapentin 100 MG capsule Commonly known as: NEURONTIN Take 1 capsule (100 mg  total) by mouth 3 (three) times daily.   Metamucil Fiber Chew Chew 1 tablet by mouth daily.   metFORMIN 500 MG tablet Commonly known as: GLUCOPHAGE TAKE ONE TABLET BY MOUTH TWICE A DAY WITH A MEAL What changed:  how much to take how to take this when to take this additional instructions   multivitamin with minerals Tabs tablet Take 1 tablet by mouth daily. Start taking on: September 09, 2021   oxyCODONE 5 MG immediate release tablet Commonly known as: Roxicodone Take 1 tablet (5 mg total) by mouth daily as needed (30 minutes before drainage).               Durable Medical Equipment  (From admission, onward)           Start     Ordered   09/08/21 0834  For home use only DME Chest tube pluerex  Once       Comments: Had small-moderate effusion and developed some chest tightness with drainage of only about 100cc. In light of that would discharge with following drainage plan: -  she can take oxycodone 5 mg as needed 30  minutes before drainage - would start draining every other day up to 1L maximum or until develops chest tightness (start drainage 09/09/21). If drains >300cc then continue draining every other day. If drains <300cc then drain every 3 days. If drains <100cc then drain every 4 days. If drains <50cc on two occasions then call [facility] at [phone] or send my chart message so we can evaluate for possible removal or troubleshooting.   09/08/21 0835              Discharge Care Instructions  (From admission, onward)           Start     Ordered   09/07/21 0000  No dressing needed        09/07/21 1120            Follow-up Information     Charlott Rakes, MD. Schedule an appointment as soon as possible for a visit in 1 week(s).   Specialty: Family Medicine Contact information: [place] [employer] [place] [address] [phone]         Werner Lean, MD Follow up on 10/09/2021.   Specialty: Cardiology Why: appt at 8:20 am Contact information: [place] [address] [phone]         Nicholas Lose, MD Follow up on 09/14/2021.   Specialty: Hematology and Oncology Why: appt at 10:45 am, Hospital follow up Contact information: [place] [id number] [place], [facility] Follow up.   Specialty: Home Health Services Why: Home Health RN Contact information: [place] [address] [phone]         Maryjane Hurter, MD Follow up.   Specialty: Pulmonary Disease Why: Hospital follow up, Office will call with date/time, If you dont hear from them,please give them a call Contact information: [place] [address] [phone]                Allergies  Allergen Reactions   Sulfa Antibiotics     Itching      Other Procedures/Studies: DG Chest 2 View  Result Date: 09/02/2021 CLINICAL DATA:   Shortness of breath EXAM: CHEST - 2 VIEW COMPARISON:  07/25/2020 FINDINGS: Complete whiteout of the left hemithorax with  no aeration demonstrated in the left lung. Changes could represent either or combination of pleural effusion, atelectasis, and/or consolidation. The right lung is clear. Heart size is obscured by the left chest process. Degenerative changes in the spine and shoulders. A loop recorder is present. IMPRESSION: Complete whiteout of the left lung suggesting pleural effusion, atelectasis, and/or consolidation. Electronically Signed   By: Lucienne Capers M.D.   On: 09/02/2021 19:26   CT CHEST WO CONTRAST  Result Date: 09/03/2021 CLINICAL DATA:  Pleural effusion, malignancy suspected. EXAM: CT CHEST WITHOUT CONTRAST TECHNIQUE: Multidetector CT imaging of the chest was performed following the standard protocol without IV contrast. RADIATION DOSE REDUCTION: This exam was performed according to the departmental dose-optimization program which includes automated exposure control, adjustment of the mA and/or kV according to patient size and/or use of iterative reconstruction technique. COMPARISON:  None. FINDINGS: Cardiovascular: No thoracic aortic aneurysm. Aortic atherosclerosis present. Mediastinum/Nodes: Mass within the pre-vascular space measures approximately 2.1 cm (axial series 3, image 21). Additional mass within the pre-vascular space measures 1.2 cm (axial series 3, image 18). 2.1 cm hypodense lesion within the RIGHT thyroid lobe, likely an incidental thyroid cyst. Lungs/Pleura: Mixture of dense consolidations and ground-glass opacities throughout the LEFT lung. Small LEFT pleural effusion. LEFT-sided chest tube in place. 9 mm nodular density within the medial aspect of the RIGHT lower lobe (series 8, image 88). 6 mm pleural based nodular density at the junction of the RIGHT minor and major fissures (series 8, image 75). Additional pleural based nodule within the anterior aspect of the RIGHT  upper lobe measures 8 mm (series 8, image 70). Subsolid nodular density within the RIGHT lung apex measures 8 mm (series 8, image 30). Upper Abdomen: Limited images of the upper abdomen are unremarkable. Musculoskeletal: Large mass within the LEFT breast, incompletely imaged but measuring at least 10 cm greatest dimension. Additional mass within the lower LEFT axilla measures 2.5 cm. Numerous enlarged lymph nodes within the overlying LEFT axilla. Mixed blastic and lytic changes of the sternum. IMPRESSION: 1. Large irregular mass within the LEFT breast, incompletely imaged but measuring at least 10 cm greatest dimension, almost certainly representing invasive breast cancer. 2. Numerous masses within the LEFT axilla, largest measuring 2.5 cm, almost certainly representing metastatic lymphadenopathy. 3. 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 Superimposed pneumonia not excluded if febrile. 4. At least 2 masses within the pre-vascular mediastinum, largest measuring 2.1 cm, likely additional metastatic lymphadenopathy. One or both could also represent direct extension of the LEFT breast mass given the destructive change/discontinuity of the overlying sternum. 5. Small LEFT pleural effusion. LEFT-sided chest tube in place. 6. Multiple RIGHT-sided pulmonary nodules, suspicious for additional metastatic disease. 7. Blastic and lytic lesions throughout the sternum, compatible with metastatic disease. 8. 2.1 cm hypodense lesion within the RIGHT thyroid lobe. Recommend thyroid US (ref: J Am Coll Radiol. 2015 Feb;12(2): 143-50). Aortic Atherosclerosis (ICD10-I70.0). Electronically Signed   By: Franki Cabot M.D.   On: 09/03/2021 13:06   DG CHEST PORT 1 VIEW  Result Date: 09/07/2021 CLINICAL DATA:  Pleural effusion in a 76 year old female. EXAM: PORTABLE CHEST 1 VIEW COMPARISON:  February 9th at 5:01 a.m. FINDINGS: Thoracostomy  tube projects over the LEFT lower chest compatible with PleurX catheter. Is coiled over the LEFT hemidiaphragm entering via LEFT-sided approach. EKG leads project over the chest. Cardiac loop recorder projecting over cardiac silhouette. Cardiomediastinal contours and hilar structures are stable. RIGHT lung is clear. Persistent  consolidative change at the LEFT lung base perhaps with slightly improved aeration compared to the study of September 07, 2021. Suspect persistent small effusion. No visible pneumothorax. On limited assessment there is no acute skeletal process. IMPRESSION: 1. PleurX catheter in place in the LEFT chest without visible pneumothorax. 2. Persistent consolidative change at the LEFT lung base with perhaps slightly improved aeration compared to the study of September 07, 2021. Suspect persistent small effusion. Electronically Signed   By: Zetta Bills M.D.   On: 09/07/2021 17:14   DG Chest Port 1 View  Result Date: 09/07/2021 CLINICAL DATA:  Abnormal respiration EXAM: PORTABLE CHEST 1 VIEW COMPARISON:  09/05/2021 chest radiograph. FINDINGS: Loop recorder overlies the left chest. Interval removal of left chest tube. Stable cardiomediastinal silhouette with mild cardiomegaly. No pneumothorax. No right pleural effusion. Hazy patchy opacity in the mid to lower left lung with blunting of the left costophrenic angle, not appreciably changed. Clear right lung. IMPRESSION: 1. No pneumothorax status post left chest tube removal. 2. Stable hazy patchy opacity in the mid to lower left lung with blunting of the left costophrenic angle, favor pneumonia and/or atelectasis. 3. Stable mild cardiomegaly. Electronically Signed   By: Ilona Sorrel M.D.   On: 09/07/2021 08:05   DG CHEST PORT 1 VIEW  Result Date: 09/05/2021 CLINICAL DATA:  Chest tube EXAM: PORTABLE CHEST 1 VIEW COMPARISON:  Two days ago FINDINGS: Increased hazy opacification of the left chest. Pleural fluid is seen along the lateral lung edge. Normal  heart size and mediastinal contours given rotation. Implantable loop recorder over the left chest. Clear right lung. IMPRESSION: Worsening aeration at the left base at least partially from increasing pleural fluid. No pneumothorax. Electronically Signed   By: Jorje Guild M.D.   On: 09/05/2021 06:23   DG Chest Port 1 View  Result Date: 09/03/2021 CLINICAL DATA:  Status post chest tube insertion. EXAM: PORTABLE CHEST 1 VIEW COMPARISON:  09/02/2021. FINDINGS: Cardiac enlargement is unchanged. Loop recorder is again noted in the projection of the left side of heart. Interval placement of left lateral basilar chest tube. Significant interval improvement in aeration to the left lung. No pneumothorax identified. Residual hazy opacities throughout the left lung are now visualized. Right lung is clear. IMPRESSION: 1. Significant interval improvement in aeration to the left lung status post left chest tube placement. Residual hazy opacities throughout the left lung. 2. No pneumothorax. Electronically Signed   By: Kerby Moors M.D.   On: 09/03/2021 10:39   ECHOCARDIOGRAM COMPLETE  Result Date: 09/03/2021    ECHOCARDIOGRAM REPORT   Patient Name:   KENDALLYN LIPPOLD Date of Exam: 09/03/2021 Medical Rec #:  [id number]          Height:       62.0 in Accession #:    [id number]         Weight:       150.0 lb Date of Birth:  1945-10-19          BSA:          1.692 m Patient Age:    56 years           BP:           104/57 mmHg Patient Gender: F                  HR:           79 bpm. Exam Location:  Inpatient Procedure: 2D Echo, Cardiac Doppler and Color Doppler  Indications:    Pleural effusion on left [[id number]]  History:        Patient has prior history of Echocardiogram examinations, most                 recent 05/15/2018. Signs/Symptoms:Shortness of Breath.  Sonographer:    Merrie Roof RDCS Referring Phys: [place]  1. Asymmetric septal hypertophy. late peaking mild LVOT obstruction. Anterior leaflet  appears prolonged, no sig SAM. Consistent with HOCM. Recommend cardiac MRI if clinically indicated. Left ventricular ejection fraction, by estimation, is 60 to 65%. The left ventricle has normal function. Left ventricular diastolic parameters are consistent with Grade I diastolic dysfunction (impaired relaxation).  2. Right ventricular systolic function is normal. The right ventricular size is normal. There is normal pulmonary artery systolic pressure.  3. The mitral valve was not well visualized. No evidence of mitral valve regurgitation.  4. The aortic valve is grossly normal. Aortic valve regurgitation is not visualized.  5. The inferior vena cava is normal in size with greater than 50% respiratory variability, suggesting right atrial pressure of 3 mmHg. Comparison(s): No significant change from prior study. FINDINGS  Left Ventricle: Asymmetric septal hypertophy. late peaking mild LVOT obstruction. Anterior leaflet appears prolonged, no sig SAM. Consistent with HOCM. Recommend cardiac MRI if clinically indicated. Left ventricular ejection fraction, by estimation, is 60 to 65%. The left ventricle has normal function. The left ventricular internal cavity size was normal in size. Left ventricular diastolic parameters are consistent with Grade I diastolic dysfunction (impaired relaxation). Right Ventricle: The right ventricular size is normal. No increase in right ventricular wall thickness. Right ventricular systolic function is normal. There is normal pulmonary artery systolic pressure. The tricuspid regurgitant velocity is 2.81 m/s, and  with an assumed right atrial pressure of 3 mmHg, the estimated right ventricular systolic pressure is 53.6 mmHg. Left Atrium: Left atrial size was normal in size. Right Atrium: Right atrial size was normal in size. Pericardium: There is no evidence of pericardial effusion. Mitral Valve: The mitral valve was not well visualized. No evidence of mitral valve regurgitation. Tricuspid  Valve: The tricuspid valve is not well visualized. Tricuspid valve regurgitation is mild. Aortic Valve: The aortic valve is grossly normal. Aortic valve regurgitation is not visualized. Aortic valve mean gradient measures 6.0 mmHg. Aortic valve peak gradient measures 10.9 mmHg. Aortic valve area, by VTI measures 2.10 cm. Pulmonic Valve: The pulmonic valve was not well visualized. Pulmonic valve regurgitation is trivial. Aorta: The aortic root and ascending aorta are structurally normal, with no evidence of dilitation. Venous: The inferior vena cava is normal in size with greater than 50% respiratory variability, suggesting right atrial pressure of 3 mmHg. IAS/Shunts: The interatrial septum was not well visualized.  LEFT VENTRICLE PLAX 2D LVIDd:         3.40 cm   Diastology LV PW:         1.00 cm   LV e' medial:    5.98 cm/s LV IVS:        1.00 cm   LV E/e' medial:  11.2 LVOT diam:     1.90 cm   LV e' lateral:   6.53 cm/s LV SV:         65        LV E/e' lateral: 10.2 LV SV Index:   38 LVOT Area:     2.84 cm  3D Volume EF:                          3D EF:        60 %                          LV EDV:       72 ml                          LV ESV:       29 ml                          LV SV:        43 ml RIGHT VENTRICLE RV Basal diam:  2.70 cm LEFT ATRIUM             Index        RIGHT ATRIUM           Index LA diam:        2.10 cm 1.24 cm/m   RA Area:     11.40 cm LA Vol (A2C):   62.3 ml 36.83 ml/m  RA Volume:   24.90 ml  14.72 ml/m LA Vol (A4C):   35.3 ml 20.87 ml/m LA Biplane Vol: 46.9 ml 27.72 ml/m  AORTIC VALVE AV Area (Vmax):    2.18 cm AV Area (Vmean):   2.05 cm AV Area (VTI):     2.10 cm AV Vmax:           165.00 cm/s AV Vmean:          110.000 cm/s AV VTI:            0.308 m AV Peak Grad:      10.9 mmHg AV Mean Grad:      6.0 mmHg LVOT Vmax:         127.00 cm/s LVOT Vmean:        79.500 cm/s LVOT VTI:          0.228 m LVOT/AV VTI ratio: 0.74  AORTA Ao Root diam: 3.00 cm Ao Asc  diam:  2.50 cm MITRAL VALVE               TRICUSPID VALVE MV Area (PHT): 3.72 cm    TR Peak grad:   31.6 mmHg MV Decel Time: 204 msec    TR Vmax:        281.00 cm/s MV E velocity: 66.90 cm/s MV A velocity: 85.20 cm/s  SHUNTS MV E/A ratio:  0.79        Systemic VTI:  0.23 m                            Systemic Diam: 1.90 cm Phineas Inches Electronically signed by Phineas Inches Signature Date/Time: 09/03/2021/2:49:14 PM    Final    VAS [place] LOWER EXTREMITY VENOUS (DVT)  Result Date: 09/04/2021  Lower Venous DVT Study Patient Name:  [name]  Date of Exam:   09/03/2021 Medical Rec #: [id number]           Accession #:    [id number] Date of Birth: August 16, 1945           Patient Gender: F Patient Age:   2 years Exam Location:  [facility] Procedure:  VAS [place] LOWER EXTREMITY VENOUS (DVT) Referring Phys: Oren Binet --------------------------------------------------------------------------------  Indications: Swelling, Edema, and elevated ddimer.  Comparison Study: no prior Performing Technologist: Archie Patten RVS  Examination Guidelines: A complete evaluation includes B-mode imaging, spectral Doppler, color Doppler, and power Doppler as needed of all accessible portions of each vessel. Bilateral testing is considered an integral part of a complete examination. Limited examinations for reoccurring indications may be performed as noted. The reflux portion of the exam is performed with the patient in reverse Trendelenburg.  +---------+---------------+---------+-----------+----------+--------------+  RIGHT     Compressibility Phasicity Spontaneity Properties Thrombus Aging  +---------+---------------+---------+-----------+----------+--------------+  CFV       Full            Yes       Yes                                    +---------+---------------+---------+-----------+----------+--------------+  SFJ       Full                                                              +---------+---------------+---------+-----------+----------+--------------+  FV Prox   Full                                                             +---------+---------------+---------+-----------+----------+--------------+  FV Mid    Full                                                             +---------+---------------+---------+-----------+----------+--------------+  FV Distal Full                                                             +---------+---------------+---------+-----------+----------+--------------+  PFV       Full                                                             +---------+---------------+---------+-----------+----------+--------------+  POP       Full            Yes       Yes                                    +---------+---------------+---------+-----------+----------+--------------+  PTV       Full                                                             +---------+---------------+---------+-----------+----------+--------------+  PERO      Full                                                             +---------+---------------+---------+-----------+----------+--------------+   +---------+---------------+---------+-----------+----------+--------------+  LEFT      Compressibility Phasicity Spontaneity Properties Thrombus Aging  +---------+---------------+---------+-----------+----------+--------------+  CFV       Full            Yes       Yes                                    +---------+---------------+---------+-----------+----------+--------------+  SFJ       Full                                                             +---------+---------------+---------+-----------+----------+--------------+  FV Prox   Full                                                             +---------+---------------+---------+-----------+----------+--------------+  FV Mid    Full                                                              +---------+---------------+---------+-----------+----------+--------------+  FV Distal Full                                                             +---------+---------------+---------+-----------+----------+--------------+  PFV       Full                                                             +---------+---------------+---------+-----------+----------+--------------+  POP       Full            Yes       Yes                                    +---------+---------------+---------+-----------+----------+--------------+  PTV       Full                                                             +---------+---------------+---------+-----------+----------+--------------+  PERO      Full                                                             +---------+---------------+---------+-----------+----------+--------------+     Summary: BILATERAL: - No evidence of deep vein thrombosis seen in the lower extremities, bilaterally. -No evidence of popliteal cyst, bilaterally.   *See table(s) above for measurements and observations. Electronically signed by Orlie Pollen on 09/04/2021 at 7:48:27 PM.    Final      TODAY-DAY OF DISCHARGE:  Subjective:   Heather Rogers today has no headache,no chest abdominal pain,no new weakness tingling or numbness, feels much better wants to go home today.   Objective:   Blood pressure 117/66, pulse 81, temperature 98.9 F (37.2 C), temperature source Oral, resp. rate (!) 22, height $RemoveBe'5\' 2"'aXOXbtaOx$  (1.575 m), weight 63.1 kg, SpO2 95 %. No intake or output data in the 24 hours ending 09/08/21 0942 Filed Weights   09/06/21 0543 09/07/21 0500 09/08/21 0500  Weight: 67 kg 67.1 kg 63.1 kg    Exam: Awake Alert, Oriented *3, No new F.N deficits, Normal affect [place].AT,PERRAL Supple Neck,No JVD, No cervical lymphadenopathy appriciated.  Symmetrical Chest wall movement, Good air movement bilaterally, CTAB RRR,No Gallops,Rubs or new Murmurs, No Parasternal Heave +ve B.Sounds, Abd Soft,  Non tender, No organomegaly appriciated, No rebound -guarding or rigidity. No Cyanosis, Clubbing or edema, No new Rash or bruise   PERTINENT RADIOLOGIC STUDIES: DG CHEST PORT 1 VIEW  Result Date: 09/07/2021 CLINICAL DATA:  Pleural effusion in a 76 year old female. EXAM: PORTABLE CHEST 1 VIEW COMPARISON:  February 9th at 5:01 a.m. FINDINGS: Thoracostomy tube projects over the LEFT lower chest compatible with PleurX catheter. Is coiled over the LEFT hemidiaphragm entering via LEFT-sided approach. EKG leads project over the chest. Cardiac loop recorder projecting over cardiac silhouette. Cardiomediastinal contours and hilar structures are stable. RIGHT lung is clear. Persistent consolidative change at the LEFT lung base perhaps with slightly improved aeration compared to the study of September 07, 2021. Suspect persistent small effusion. No visible pneumothorax. On limited assessment there is no acute skeletal process. IMPRESSION: 1. PleurX catheter in place in the LEFT chest without visible pneumothorax. 2. Persistent consolidative change at the LEFT lung base with perhaps slightly improved aeration compared to the study of September 07, 2021. Suspect persistent small effusion. Electronically Signed   By: Zetta Bills M.D.   On: 09/07/2021 17:14   DG Chest Port 1 View  Result Date: 09/07/2021 CLINICAL DATA:  Abnormal respiration EXAM: PORTABLE CHEST 1 VIEW COMPARISON:  09/05/2021 chest radiograph. FINDINGS: Loop recorder overlies the left chest. Interval removal of left chest tube. Stable cardiomediastinal silhouette with mild cardiomegaly. No pneumothorax. No right pleural effusion. Hazy patchy opacity in the mid to lower left lung with blunting of the left costophrenic angle, not appreciably changed. Clear right lung. IMPRESSION: 1. No pneumothorax status post left chest tube removal. 2. Stable hazy patchy opacity in the mid to lower left lung with blunting of the left costophrenic angle, favor pneumonia  and/or atelectasis. 3. Stable mild cardiomegaly. Electronically Signed   By: Ilona Sorrel M.D.   On: 09/07/2021 08:05     PERTINENT LAB RESULTS: CBC: Recent Labs    09/07/21 0358  WBC 6.6  HGB 8.5*  HCT 26.4*  PLT 393   CMET CMP     Component Value Date/Time   NA 136 09/07/2021 0358   NA 140 05/24/2021 1003   K 4.2 09/07/2021 0358   CL 106 09/07/2021 0358   CO2 21 (L) 09/07/2021 0358   GLUCOSE 152 (H) 09/07/2021 0358   BUN 38 (H) 09/07/2021 0358   BUN 25 05/24/2021 1003   CREATININE 1.20 (H) 09/07/2021 0358   CALCIUM 8.8 (L) 09/07/2021 0358   PROT 6.1 (L) 09/04/2021 0350   PROT 7.2 05/24/2021 1003   ALBUMIN 2.2 (L) 09/04/2021 0350   ALBUMIN 4.3 05/24/2021 1003   AST 64 (H) 09/04/2021 0350   ALT 61 (H) 09/04/2021 0350   ALKPHOS 50 09/04/2021 0350   BILITOT 0.2 (L) 09/04/2021 0350   BILITOT 0.2 05/24/2021 1003   GFRNONAA 47 (L) 09/07/2021 0358   GFRAA 58 (L) 08/17/2020 0948    GFR Estimated Creatinine Clearance: 35.4 mL/min (A) (by C-G formula based on SCr of 1.2 mg/dL (H)). No results for input(s): LIPASE, AMYLASE in the last 72 hours. No results for input(s): CKTOTAL, CKMB, CKMBINDEX, TROPONINI in the last 72 hours. Invalid input(s): POCBNP No results for input(s): DDIMER in the last 72 hours. No results for input(s): HGBA1C in the last 72 hours. No results for input(s): CHOL, HDL, LDLCALC, TRIG, CHOLHDL, LDLDIRECT in the last 72 hours. No results for input(s): TSH, T4TOTAL, T3FREE, THYROIDAB in the last 72 hours.  Invalid input(s): FREET3 No results for input(s): VITAMINB12, FOLATE, FERRITIN, TIBC, IRON, RETICCTPCT in the last 72 hours. Coags: No results for input(s): INR in the last 72 hours.  Invalid input(s): PT Microbiology: Recent Results (from the past 240 hour(s))  Resp Panel by RT-PCR (Flu A&B, Covid) Nasopharyngeal Swab     Status: None   Collection Time: 09/02/21  6:40 PM   Specimen: Nasopharyngeal Swab; Nasopharyngeal(NP) swabs in vial transport  medium  Result Value Ref Range Status   SARS Coronavirus 2 by RT PCR NEGATIVE NEGATIVE Final    Comment: (NOTE) SARS-CoV-2 target nucleic acids are NOT DETECTED.  The SARS-CoV-2 RNA is generally detectable in upper respiratory specimens during the acute phase of infection. The lowest concentration of SARS-CoV-2 viral copies this assay can detect is 138 copies/mL. A negative result does not preclude SARS-Cov-2 infection and should not be used as the sole basis for treatment or other patient management decisions. A negative result may occur with  improper specimen collection/handling, submission of specimen other than nasopharyngeal swab, presence of viral mutation(s) within the areas targeted by this assay, and inadequate number of viral copies(<138 copies/mL). A negative result must be combined with clinical observations, patient history, and epidemiological information. The expected result is Negative.  Fact Sheet for Patients:  EntrepreneurPulse.com.au  Fact Sheet for Healthcare Providers:  IncredibleEmployment.be  This test is no t yet approved or cleared by the [place] FDA and  has been authorized for detection and/or diagnosis of SARS-CoV-2 by FDA under an Emergency Use Authorization (EUA). This EUA will remain  in effect (meaning this test can be used) for the duration of the COVID-19 declaration under Section 564(b)(1) of the Act, 21 U.S.C.section 360bbb-3(b)(1), unless the authorization is terminated  or revoked sooner.       Influenza A by PCR NEGATIVE NEGATIVE Final   Influenza B by PCR NEGATIVE NEGATIVE Final    Comment: (NOTE) The Xpert Xpress SARS-CoV-2/FLU/RSV plus assay is intended as an aid in the diagnosis of influenza from Nasopharyngeal swab specimens and should not be  used as a sole basis for treatment. Nasal washings and aspirates are unacceptable for Xpert Xpress SARS-CoV-2/FLU/RSV testing.  Fact Sheet for  Patients: EntrepreneurPulse.com.au  Fact Sheet for Healthcare Providers: IncredibleEmployment.be  This test is not yet approved or cleared by the [place] FDA and has been authorized for detection and/or diagnosis of SARS-CoV-2 by FDA under an Emergency Use Authorization (EUA). This EUA will remain in effect (meaning this test can be used) for the duration of the COVID-19 declaration under Section 564(b)(1) of the Act, 21 U.S.C. section 360bbb-3(b)(1), unless the authorization is terminated or revoked.  Performed at [facility], [place] [address], [place], [place] [address]   Body fluid culture w Gram Stain     Status: None   Collection Time: 09/03/21  8:30 AM   Specimen: Pleural Fluid  Result Value Ref Range Status   Specimen Description PLEURAL FLUID  Final   Special Requests LEFT  Final   Gram Stain   Final    WBC PRESENT,BOTH PMN AND MONONUCLEAR NO ORGANISMS SEEN CYTOSPIN SMEAR    Culture   Final    NO GROWTH Performed at [facility], [place] [address], [place], [place] [address]    Report Status 09/06/2021 FINAL  Final    FURTHER DISCHARGE INSTRUCTIONS:  Get Medicines reviewed and adjusted: Please take all your medications with you for your next visit with your Primary MD  Laboratory/radiological data: Please request your Primary MD to go over all hospital tests and procedure/radiological results at the follow up, please ask your Primary MD to get all Hospital records sent to his/her office.  In some cases, they will be blood work, cultures and biopsy results pending at the time of your discharge. Please request that your primary care M.D. goes through all the records of your hospital data and follows up on these results.  Also Note the following: If you experience worsening of your admission symptoms, develop shortness of breath, life threatening emergency, suicidal or homicidal thoughts you must seek medical  attention immediately by calling 911 or calling your MD immediately  if symptoms less severe.  You must read complete instructions/literature along with all the possible adverse reactions/side effects for all the Medicines you take and that have been prescribed to you. Take any new Medicines after you have completely understood and accpet all the possible adverse reactions/side effects.   Do not drive when taking Pain medications or sleeping medications (Benzodaizepines)  Do not take more than prescribed Pain, Sleep and Anxiety Medications. It is not advisable to combine anxiety,sleep and pain medications without talking with your primary care practitioner  Special Instructions: If you have smoked or chewed Tobacco  in the last 2 yrs please stop smoking, stop any regular Alcohol  and or any Recreational drug use.  Wear Seat belts while driving.  Please note: You were cared for by a hospitalist during your hospital stay. Once you are discharged, your primary care physician will handle any further medical issues. Please note that NO REFILLS for any discharge medications will be authorized once you are discharged, as it is imperative that you return to your primary care physician (or establish a relationship with a primary care physician if you do not have one) for your post hospital discharge needs so that they can reassess your need for medications and monitor your lab values.  Total Time spent coordinating discharge including counseling, education and face to face time equals greater than 30 minutes.  Signed: Oren Binet  09/08/2021 9:42 AM

## 2021-09-07 NOTE — Discharge Instructions (Addendum)
[place] [facility] Stay Proper nutrition can help your body recover from illness and injury.   Foods and beverages high in protein, vitamins, and minerals help rebuild muscle loss, promote healing, & reduce fall risk.   In addition to eating healthy foods, a nutrition shake is an easy, delicious way to get the nutrition you need during and after your hospital stay  It is recommended that you continue to drink 2 bottles per day of:     Ensure Plus for at least 1 month (30 days) after your hospital stay   Tips for adding a nutrition shake into your routine: As allowed, drink one with vitamins or medications instead of water or juice Enjoy one as a tasty mid-morning or afternoon snack Drink cold or make a milkshake out of it Drink one instead of milk with cereal or snacks Use as a coffee creamer   Available at the following grocery stores and pharmacies:           * [employer] [phone]            For COUPONS visit: www.ensure.com/join or http://dawson-may.com/   Suggested Substitutions Ensure Plus = Boost Plus = Carnation Breakfast Essentials = Boost Compact Ensure Active Clear = Boost Breeze Glucerna Shake = Boost Glucose Control = Carnation Breakfast Essentials SUGAR FREE    Pleurex Drain Instructions Had small-moderate effusion and developed some chest tightness with drainage of only about 100cc. In light of that would discharge with following drainage plan: -  she can take oxycodone 5 mg as needed 30 minutes before drainage - would start draining every other day up to 1L maximum or until develops chest tightness (start drainage 09/09/21). If drains >300cc then continue draining every other day. If drains <300cc then drain every 3 days. If drains <100cc then drain every 4 days. If drains <50cc on two occasions  then call  Pulmonary at [phone] or send my chart message so we can evaluate for possible removal or troubleshooting.

## 2021-09-07 NOTE — Op Note (Signed)
PleurX Insertion Procedure Note  Heather Rogers  [id number]  [birth year]  Date:09/07/21  Time:4:21 PM   Provider Performing:Khambrel Amsden M Verlee Monte  Procedure: PleurX Tunneled Pleural Catheter Placement (62229)  Indication(s) Relief of dyspnea from recurrent effusion  Consent Risks of the procedure as well as the alternatives and risks of each were explained to the patient and/or caregiver.  Consent for the procedure was obtained.   Anesthesia Topical only with 1% lidocaine    Time Out Verified patient identification, verified procedure, site/side was marked, verified correct patient position, special equipment/implants available, medications/allergies/relevant history reviewed, required imaging and test results available.   Sterile Technique Maximal sterile technique including sterile barrier drape, hand hygiene, sterile gown, sterile gloves, mask, hair covering.    Procedure Description Ultrasound used to identify appropriate pleural anatomy for placement and overlying skin marked.  Area of drainage cleaned and draped in sterile fashion.   Lidocaine was used to anesthetize the skin and subcutaneous tissue.   1.5 cm incision made overlying fluid and another about 5 cm anterior to this along chest wall.  PleurX catheter inserted in usual sterile fashion using modified seldinger technique.  Interrupted silk sutures placed at catheter insertion and tunneling points which will be removed at later date.  PleurX catheter then hooked to suction with 100cc withdrawn before onset of chest tightness.  After fluid aspirated, pleurX capped and sterile dressing applied.   Complications/Tolerance None; patient tolerated the procedure well. Chest X-ray is ordered to confirm no post-procedural complication.   EBL Minimal   Specimen(s) none

## 2021-09-07 NOTE — TOC Progression Note (Addendum)
Transition of Care [facility]) - Progression Note    Patient Details  Name: TIFFINY WORTHY MRN: [id number] Date of Birth: Sep 25, 1945  Transition of Care [facility] CM/SW [place], RN Phone Number: 09/07/2021, 12:56 PM  Clinical Narrative:     Called patient regarding home with RN for home health, plurex drain. Education was done with patient and daughter. Patient states she has no HH in mind and would rather her daughter set this up. Called [name], daughter and left confidential voice message to return my call. [phone] Ms Dogett called this RM Cm back. She iwll look on Medicare.gov for [place] and  call back. [phone] Reached out to [employer] for pluerex drain canisters. They are checking.. [place] called for acceptance for RN with Plurex drain. Information on AVS. Instructed RN that the family would need 10 plurex canisters from materials for family to take home. Producer, television/film/video will be faxing paperwork done by Dr Verlee Monte to the company to send more cannister's.    Barriers to Discharge: No Barriers Identified  Expected Discharge Plan and Services                                                 Social Determinants of Health (SDOH) Interventions    Readmission Risk Interventions No flowsheet data found.

## 2021-09-07 NOTE — Progress Notes (Signed)
Occupational Therapy Treatment Patient Details Name: Heather Rogers MRN: [id number] DOB: [birth year] Today's Date: 09/07/2021   History of present illness 76 y.o. female presents to [facility] hospital on 09/02/2021 with SOB, found to have a large left-sided pleural effusion. Pt underwent chest tube placement on 09/03/2021. PMH includes CVA, DMII, HTN.   OT comments  Pt making good progress with functional goals. Pt eager to d/c home with daughter this evening. Pt is at Sup level with mobility, min guard A for LB selfcare sit - stand, Sup with toileting tasks   Recommendations for follow up therapy are one component of a multi-disciplinary discharge planning process, led by the attending physician.  Recommendations may be updated based on patient status, additional functional criteria and insurance authorization.    Follow Up Recommendations  No OT follow up    Assistance Recommended at Discharge Intermittent Supervision/Assistance  Patient can return home with the following  A little help with bathing/dressing/bathroom;A little help with walking and/or transfers;Assistance with cooking/housework   Equipment Recommendations  Tub/shower bench    Recommendations for Other Services      Precautions / Restrictions Precautions Precautions: Fall Precaution Comments: monitor SpO2, chest tube Restrictions Weight Bearing Restrictions: No       Mobility Bed Mobility Overal bed mobility: Modified Independent                  Transfers Overall transfer level: Needs assistance Equipment used: None   Sit to Stand: Supervision           General transfer comment: supervision for safety     Balance                                           ADL either performed or assessed with clinical judgement   ADL Overall ADL's : Needs assistance/impaired Eating/Feeding: Independent   Grooming: Wash/dry hands;Wash/dry face;Min guard;Standing       Lower Body Bathing:  Minimal assistance;Min guard Lower Body Bathing Details (indicate cue type and reason): simulated seated     Lower Body Dressing: Minimal assistance;Min guard   Toilet Transfer: Ambulation;Supervision/safety Armed forces technical officer Details (indicate cue type and reason): simulated via functional mobilty with no AD Toileting- Clothing Manipulation and Hygiene: Sit to/from stand;Supervision/safety       Functional mobility during ADLs: Supervision/safety General ADL Comments: pt continues to present with decreased activity tolerance, generalized deconditioning and decreased cardiopulmonary endurance    Extremity/Trunk Assessment Upper Extremity Assessment Upper Extremity Assessment: Generalized weakness   Lower Extremity Assessment Lower Extremity Assessment: Defer to PT evaluation   Cervical / Trunk Assessment Cervical / Trunk Assessment: Normal    Vision Baseline Vision/History: 1 Wears glasses Ability to See in Adequate Light: 0 Adequate Patient Visual Report: No change from baseline     Perception     Praxis      Cognition Arousal/Alertness: Awake/alert Behavior During Therapy: WFL for tasks assessed/performed Overall Cognitive Status: Within Functional Limits for tasks assessed                                          Exercises      Shoulder Instructions       General Comments      Pertinent Vitals/ Pain       Pain Assessment Pain  Assessment: Faces Faces Pain Scale: Hurts a little bit Pain Location: L side at chest tube insertion site with bed mobility Pain Descriptors / Indicators: Sore Pain Intervention(s): Monitored during session, Repositioned  Home Living                                          Prior Functioning/Environment              Frequency  Min 2X/week        Progress Toward Goals  OT Goals(current goals can now be found in the care plan section)  Progress towards OT goals: Progressing toward  goals     Plan Discharge plan remains appropriate;Frequency remains appropriate    Co-evaluation                 AM-PAC OT "6 Clicks" Daily Activity     Outcome Measure   Help from another person eating meals?: None Help from another person taking care of personal grooming?: A Little Help from another person toileting, which includes using toliet, bedpan, or urinal?: A Little Help from another person bathing (including washing, rinsing, drying)?: A Little Help from another person to put on and taking off regular upper body clothing?: None Help from another person to put on and taking off regular lower body clothing?: A Little 6 Click Score: 20    End of Session    OT Visit Diagnosis: Other abnormalities of gait and mobility (R26.89);Muscle weakness (generalized) (M62.81)   Activity Tolerance Patient tolerated treatment well   Patient Left in bed;with call bell/phone within reach;with bed alarm set   Nurse Communication          Time: 5681-2751 OT Time Calculation (min): 20 min  Charges: OT General Charges $OT Visit: 1 Visit OT Treatments $Self Care/Home Management : 8-22 mins    Britt Bottom 09/07/2021, 3:45 PM

## 2021-09-07 NOTE — Progress Notes (Signed)
PROGRESS NOTE        PATIENT DETAILS Name: Heather Rogers Age: 76 y.o. Sex: female Date of Birth: [birth year] Admit Date: 09/02/2021 Admitting Physician Evalee Mutton Kristeen Mans, MD TMA:[id number], Charlane Ferretti, MD  Brief Summary: Patient is a 76 y.o.  female CVA with right hemiparesis, DM-2, HTN-presenting with shortness of breath-found to have a large left-sided pleural effusion.  Further work-up revealed exudative pleural effusion- likely due to metastatic breast cancer.  See below for further details  Significant Hospital events: 2/4>> admit to [facility] for shortness of breath-found to have large left sided pleural effusion.  Significant imaging studies: 2/4>> CXR: Left lung whiteout 2/5>> bilateral lower extremity Doppler: Negative 2/5>> Echo: EF 60-65%, asymmetric septal hypertrophy-consistent with HOCUM 2/5>> CT chest: 10 cm left breast mass, axillary/mediastinal lymphadenopathy, suspicion for lymphangitic spread, blastic/lytic lesions throughout the sternum.  2.1 cm dense right thyroid lobe nodule. 2/7>> CXR: Worsening left pleural effusion  Significant microbiology data: 2/4>> FLU/COVID PCR: Negative 2/5>> pleural fluid cultures: No growth  Cytology/pathology data: 2/5>> pleural fluid cytology: Malignant cells consistent with metastatic adenocarcinoma  Procedures: 2/5>> chest tube insertion  Consults:  PCCM, oncology  Subjective: Wants to go home after Pleurx catheter insertion.  Objective: Vitals: Blood pressure (!) 103/57, pulse 82, temperature 99.3 F (37.4 C), temperature source Oral, resp. rate 16, height 5\' 2"  (1.575 m), weight 67.1 kg, SpO2 95 %.   Exam: Gen Exam:Alert awake-not in any distress HEENT:atraumatic, normocephalic Chest: B/L clear to auscultation anteriorly CVS:S1S2 regular Abdomen:soft non tender, non distended Extremities:no edema Neurology: Non focal Skin: no rash   Pertinent Labs/Radiology: CBC Latest Ref Rng & Units  09/07/2021 09/04/2021 09/03/2021  WBC 4.0 - 10.5 K/uL 6.6 6.1 7.7  Hemoglobin 12.0 - 15.0 g/dL 8.5(L) 8.8(L) 9.7(L)  Hematocrit 36.0 - 46.0 % 26.4(L) 28.6(L) 30.6(L)  Platelets 150 - 400 K/uL 393 392 345    Lab Results  Component Value Date   NA 136 09/07/2021   K 4.2 09/07/2021   CL 106 09/07/2021   CO2 21 (L) 09/07/2021      Assessment/Plan: * Pleural effusion on left- (present on admission) S/p left-sided pigtail chest tube placement by PCCM on 2/5.  Pleural fluid analysis consistent with an exudate-given findings on CT chest-highly suspicion that this is due to stage IV breast cancer.  Cytology has come back positive for metastatic adenocarcinoma.  Since pleural effusion worsened after clamping chest tube-PCCM planning on Pleurx catheter insertion today.  Will await further recommendations from PCCM.    Breast mass, left- (present on admission) See CT chest findings-along with pleural effusion-highly suspicious that this is all due to metastatic breast cancer.  Cytology from pleural fluid consistent with metastatic adenocarcinoma as well.  Appreciate oncology input-oncology will arrange for outpatient follow-up.  AKI (acute kidney injury) ([place]) Likely hemodynamically mediated creatinine slowly downtrending-close to baseline.  Microcytic anemia- (present on admission) Appears to be a chronic issue-iron panel not suggestive of iron deficiency anemia.  Defer further to the outpatient setting.Marland Kitchen  HOCM (hypertrophic obstructive cardiomyopathy) ([place])- (present on admission) New finding on echo-already on beta-blocker.  No history of syncope.  Avoid excessive reduction of preload.  Will need outpatient cardiology evaluation (spoke with cardiology coordinator-Trish for outpatient appointment).  Type 2 diabetes mellitus (A1c 6.4 on 2/5) - (present on admission) CBG stable with SSI.  Resume metformin on discharge.   Recent Labs  09/06/21 1655 09/06/21 2047 09/07/21 0742  GLUCAP 257* 189*  171*    Hypertension associated with diabetes ([place])- (present on admission) BP controlled-continue Coreg-no longer on amlodipine/valsartan and HCTZ.  Avoid diuretics unless she develops volume overload-she has HOCM and will need to watch for excessive preload reduction.  Dyslipidemia- (present on admission) Continue statin.  Gout- (present on admission) Stable/no flare-continue allopurinol.  History of CVA Appears to move all 4 extremities symmetrically-on aspirin/statin.  Cognitive decline- (present on admission) Daughter acknowledges some cognitive dysfunction at baseline-after CVA-needs formal neuropsychological testing in the outpatient setting.  May have some element of dementia/vascular dementia at baseline.  Positive D dimer Not hypoxic-unclear significance-lower extremity Dopplers are negative-doubt further work-up is required at this point.  Coagulopathy ([place])- (present on admission) dtr reports pt was on Eliquis research study protocol but stopped taking any research meds 3 weeks ago. Pt only takes 81 mg ASA at this point.   BMI: Estimated body mass index is 27.06 kg/m as calculated from the following:   Height as of this encounter: 5\' 2"  (1.575 m).   Weight as of this encounter: 67.1 kg.   Code status:   Code Status: Full Code   DVT Prophylaxis:SCDs Start: 09/02/21 2259    Family Communication: Daughter at bedside   Disposition Plan: Status is: Inpatient Remains inpatient appropriate because: Awaiting Pleurx catheter placement before discharge.   Planned Discharge Destination:Home   Diet: Diet Order             Diet - low sodium heart healthy           Diet general           Diet heart healthy/carb modified Room service appropriate? Yes; Fluid consistency: Thin  Diet effective now                     Antimicrobial agents: Anti-infectives (From admission, onward)    Start     Dose/Rate Route Frequency Ordered Stop   09/02/21 2030   cefTRIAXone (ROCEPHIN) 1 g in sodium chloride 0.9 % 100 mL IVPB        1 g 200 mL/hr over 30 Minutes Intravenous  Once 09/02/21 2027 09/02/21 2138   09/02/21 2030  azithromycin (ZITHROMAX) 500 mg in sodium chloride 0.9 % 250 mL IVPB        500 mg 250 mL/hr over 60 Minutes Intravenous  Once 09/02/21 2027 09/02/21 2238        MEDICATIONS: Scheduled Meds:  allopurinol  100 mg Oral Daily   aspirin EC  81 mg Oral Daily   atorvastatin  80 mg Oral QHS   carvedilol  25 mg Oral BID WC   gabapentin  100 mg Oral TID   insulin aspart  0-5 Units Subcutaneous QHS   insulin aspart  0-9 Units Subcutaneous TID WC   Continuous Infusions: PRN Meds:.acetaminophen **OR** acetaminophen, albuterol, melatonin, morphine injection, ondansetron **OR** ondansetron (ZOFRAN) IV   I have personally reviewed following labs and imaging studies  LABORATORY DATA: CBC: Recent Labs  Lab 09/02/21 1840 09/03/21 0037 09/04/21 0350 09/07/21 0358  WBC 8.9 7.7 6.1 6.6  NEUTROABS  --  5.8  --  3.9  HGB 9.6* 9.7* 8.8* 8.5*  HCT 30.7* 30.6* 28.6* 26.4*  MCV 71.9* 70.8* 71.9* 70.8*  PLT 489* 345 392 338    Basic Metabolic Panel: Recent Labs  Lab 09/02/21 1840 09/03/21 0037 09/03/21 0759 09/04/21 0350 09/05/21 0127 09/07/21 0358  NA 136  --  139  138 137 136  K 5.1  --  4.5 4.1 4.0 4.2  CL 103  --  109 106 107 106  CO2 18*  --  20* 21* 20* 21*  GLUCOSE 161*  --  95 116* 153* 152*  BUN 70*  --  63* 60* 52* 38*  CREATININE 1.61* 1.49* 1.54* 1.78* 1.39* 1.20*  CALCIUM 9.6  --  9.0 8.9 9.0 8.8*  MG  --  2.6*  --   --   --  2.5*  PHOS  --   --   --   --   --  2.7    GFR: Estimated Creatinine Clearance: 36.4 mL/min (A) (by C-G formula based on SCr of 1.2 mg/dL (H)).  Liver Function Tests: Recent Labs  Lab 09/03/21 0759 09/04/21 0350  AST  --  64*  ALT  --  61*  ALKPHOS  --  50  BILITOT  --  0.2*  PROT 6.5 6.1*  ALBUMIN  --  2.2*   No results for input(s): LIPASE, AMYLASE in the last 168  hours. No results for input(s): AMMONIA in the last 168 hours.  Coagulation Profile: No results for input(s): INR, PROTIME in the last 168 hours.  Cardiac Enzymes: No results for input(s): CKTOTAL, CKMB, CKMBINDEX, TROPONINI in the last 168 hours.  BNP (last 3 results) No results for input(s): PROBNP in the last 8760 hours.  Lipid Profile: No results for input(s): CHOL, HDL, LDLCALC, TRIG, CHOLHDL, LDLDIRECT in the last 72 hours.  Thyroid Function Tests: No results for input(s): TSH, T4TOTAL, FREET4, T3FREE, THYROIDAB in the last 72 hours.  Anemia Panel: No results for input(s): VITAMINB12, FOLATE, FERRITIN, TIBC, IRON, RETICCTPCT in the last 72 hours.  Urine analysis:    Component Value Date/Time   COLORURINE YELLOW 07/25/2020 1057   APPEARANCEUR HAZY (A) 07/25/2020 1057   LABSPEC 1.017 07/25/2020 1057   PHURINE 5.0 07/25/2020 1057   GLUCOSEU NEGATIVE 07/25/2020 1057   HGBUR SMALL (A) 07/25/2020 1057   BILIRUBINUR NEGATIVE 07/25/2020 1057   KETONESUR 5 (A) 07/25/2020 1057   PROTEINUR 30 (A) 07/25/2020 1057   UROBILINOGEN 0.2 12/20/2009 1049   NITRITE NEGATIVE 07/25/2020 1057   LEUKOCYTESUR LARGE (A) 07/25/2020 1057    Sepsis Labs: Lactic Acid, Venous No results found for: LATICACIDVEN  MICROBIOLOGY: Recent Results (from the past 240 hour(s))  Resp Panel by RT-PCR (Flu A&B, Covid) Nasopharyngeal Swab     Status: None   Collection Time: 09/02/21  6:40 PM   Specimen: Nasopharyngeal Swab; Nasopharyngeal(NP) swabs in vial transport medium  Result Value Ref Range Status   SARS Coronavirus 2 by RT PCR NEGATIVE NEGATIVE Final    Comment: (NOTE) SARS-CoV-2 target nucleic acids are NOT DETECTED.  The SARS-CoV-2 RNA is generally detectable in upper respiratory specimens during the acute phase of infection. The lowest concentration of SARS-CoV-2 viral copies this assay can detect is 138 copies/mL. A negative result does not preclude SARS-Cov-2 infection and should not be  used as the sole basis for treatment or other patient management decisions. A negative result may occur with  improper specimen collection/handling, submission of specimen other than nasopharyngeal swab, presence of viral mutation(s) within the areas targeted by this assay, and inadequate number of viral copies(<138 copies/mL). A negative result must be combined with clinical observations, patient history, and epidemiological information. The expected result is Negative.  Fact Sheet for Patients:  EntrepreneurPulse.com.au  Fact Sheet for Healthcare Providers:  IncredibleEmployment.be  This test is no t yet approved or  cleared by the [place] and  has been authorized for detection and/or diagnosis of SARS-CoV-2 by FDA under an Emergency Use Authorization (EUA). This EUA will remain  in effect (meaning this test can be used) for the duration of the COVID-19 declaration under Section 564(b)(1) of the Act, 21 U.S.C.section 360bbb-3(b)(1), unless the authorization is terminated  or revoked sooner.       Influenza A by PCR NEGATIVE NEGATIVE Final   Influenza B by PCR NEGATIVE NEGATIVE Final    Comment: (NOTE) The Xpert Xpress SARS-CoV-2/FLU/RSV plus assay is intended as an aid in the diagnosis of influenza from Nasopharyngeal swab specimens and should not be used as a sole basis for treatment. Nasal washings and aspirates are unacceptable for Xpert Xpress SARS-CoV-2/FLU/RSV testing.  Fact Sheet for Patients: EntrepreneurPulse.com.au  Fact Sheet for Healthcare Providers: IncredibleEmployment.be  This test is not yet approved or cleared by the [place] FDA and has been authorized for detection and/or diagnosis of SARS-CoV-2 by FDA under an Emergency Use Authorization (EUA). This EUA will remain in effect (meaning this test can be used) for the duration of the COVID-19 declaration under Section  564(b)(1) of the Act, 21 U.S.C. section 360bbb-3(b)(1), unless the authorization is terminated or revoked.  Performed at [facility], [place] [address] [place], [place] [address]   Body fluid culture w Gram Stain     Status: None   Collection Time: 09/03/21  8:30 AM   Specimen: Pleural Fluid  Result Value Ref Range Status   Specimen Description PLEURAL FLUID  Final   Special Requests LEFT  Final   Gram Stain   Final    WBC PRESENT,BOTH PMN AND MONONUCLEAR NO ORGANISMS SEEN CYTOSPIN SMEAR    Culture   Final    NO GROWTH Performed at [facility], [place] [address], [place], [place] [address]    Report Status 09/06/2021 FINAL  Final    RADIOLOGY STUDIES/RESULTS: DG Chest Port 1 View  Result Date: 09/07/2021 CLINICAL DATA:  Abnormal respiration EXAM: PORTABLE CHEST 1 VIEW COMPARISON:  09/05/2021 chest radiograph. FINDINGS: Loop recorder overlies the left chest. Interval removal of left chest tube. Stable cardiomediastinal silhouette with mild cardiomegaly. No pneumothorax. No right pleural effusion. Hazy patchy opacity in the mid to lower left lung with blunting of the left costophrenic angle, not appreciably changed. Clear right lung. IMPRESSION: 1. No pneumothorax status post left chest tube removal. 2. Stable hazy patchy opacity in the mid to lower left lung with blunting of the left costophrenic angle, favor pneumonia and/or atelectasis. 3. Stable mild cardiomegaly. Electronically Signed   By: Ilona Sorrel M.D.   On: 09/07/2021 08:05     LOS: 4 days   Oren Binet, MD  [facility]    To contact the attending provider between 7A-7P or the covering provider during after hours 7P-7A, please log into the web site [email or web address] and access using universal [place] password for that web site. If you do not have the password, please call the hospital operator.  09/07/2021, 11:26 AM

## 2021-09-07 NOTE — Progress Notes (Signed)
Patient in [phone] at the time of this education. Education on PleurX draining provided to patient and daughter Allayne Stack). I reviewed how to access and drain with the pleurx catheter and how to apply the dressing, Allayne Stack was able to return demonstrate accessing and removing the access tip from the drainage line and removing and replacing the sterile cap. We watched the BD patient education video together. All questions were answered and patient and daughter were comfortable with the education provided.

## 2021-09-07 NOTE — Progress Notes (Signed)
PCCM Brief Progress Note  # Left malignant pleural effusion s/p PleurX: Had small-moderate effusion and developed some chest tightness with drainage of only about 100cc. In light of that would discharge with following drainage plan: -  she can take oxycodone 5 mg as needed 30 minutes before drainage - would start draining every other day up to 1L maximum or until develops chest tightness (start drainage 09/09/21). If drains >300cc then continue draining every other day. If drains <300cc then drain every 3 days. If drains <100cc then drain every 4 days. If drains <50cc on two occasions then call [facility] at [phone] or send my chart message so we can evaluate for possible removal or troubleshooting. - I will send message to set up clinic appointment for suture removal in 1-2 weeks   Will sign off but glad to be reinvolved as condition changes  [place]

## 2021-09-07 NOTE — Telephone Encounter (Signed)
Can we set up clinic appointment with me in 1-2 weeks for suture removal (pleurX). Will need x ray before visit  Thanks!

## 2021-09-07 NOTE — Progress Notes (Signed)
[place] for Resuming Anticoagulation Post-IR Procedure (Heparin SQ) Indication: VTE prophylaxis  Allergies  Allergen Reactions   Sulfa Antibiotics     Itching     Patient Measurements: Height: 5\' 2"  (157.5 cm) Weight: 67.1 kg (147 lb 14.9 oz) IBW/kg (Calculated) : 50.1  Vital Signs: Temp: 100.3 F (37.9 C) (02/09 2100) Temp Source: Oral (02/09 2100) BP: 119/62 (02/09 2100) Pulse Rate: 86 (02/09 2100)  Labs: Recent Labs    09/05/21 0127 09/07/21 0358  HGB  --  8.5*  HCT  --  26.4*  PLT  --  393  CREATININE 1.39* 1.20*    Estimated Creatinine Clearance: 36.4 mL/min (A) (by C-G formula based on SCr of 1.2 mg/dL (H)).   Medical History: Past Medical History:  Diagnosis Date   Diabetes ([place])    Hypertension    Pneumonia due to COVID-19 virus 07/25/2020   Stroke (cerebrum) [facility])    Stroke ([place]) 05/15/2018    Assessment: 76 yr old woman admitted with pleural effusion, L breast mass, AKI, microcytic anemia, hypertrophic obstructive cardiomyopathy, DM2, HTN, HLD, hx CVA; S/P insertion of Pleurx catheter today. Pharmacy is consulted to resume anticoagulants/antiplatelet medications at the appropriate time post procedure (low risk procedure, per info in consult text).   Prior to procedure, pt was receiving SQ heparin (last dose at 0600 today). Per [place] protocol, SQ heparin may be resumed ~4 hrs after low-risk procedure (procedure note logged at ~1630 PM today).  Per RN, no bleeding issues observed post procedure.  Goal of Therapy:  Prevention of VTE Monitor platelets by anticoagulation protocol: Yes   Plan:  Resume heparin 5000 units SQ Q 8 hrs at least 4 hrs after insertion of Pleurx catheter (resume at 2200 tonight) Monitor CBC Monitor for bleeding  Gillermina Hu, PharmD, BCPS, [facility] Clinical Pharmacist 09/07/2021,9:25 PM

## 2021-09-07 NOTE — Progress Notes (Signed)
Initial Nutrition Assessment  DOCUMENTATION CODES:   Not applicable  INTERVENTION:   Chocolate Ensure Enlive po BID, each supplement provides 350 kcal and 20 grams of protein.  MVI with minerals daily.  NUTRITION DIAGNOSIS:   Increased nutrient needs related to acute illness as evidenced by estimated needs.  GOAL:   Patient will meet greater than or equal to 90% of their needs  MONITOR:   PO intake, Supplement acceptance, Labs  REASON FOR ASSESSMENT:   Malnutrition Screening Tool    ASSESSMENT:   76 yo female admitted with SOB r/t large left sided pleural effusion. PMH includes DM-2, hypertrophic obstructive cardiomyopathy, HTN, dyslipidemia, gout, CVA with R hemiparesis.  S/P chest tube insertion on 2/5. Pleural fluid cytology revealed malignant cells consistent with metastatic adenocarcinoma. Suspected stage IV breast cancer. Plans for Pleurx catheter insertion today.   Spoke with patient and her daughter at bedside. Patient has been eating well at home. Since admission, intake has been worse d/t dislike of the food. Changed diet to room service with assistance so patient will have assistance choosing her meals. Daughter has been ordering meals for her, but didn't order meals yesterday, so patient ate nothing for breakfast today. Daughter states patient looks like she has lost a little weight. Her usual weight is 150 lbs currently 148 lbs. Patient has mild depletion of muscle mass in several areas. She agreed to drink Ensure supplements to maximize intake of protein and calories; likes the chocolate flavor.  Labs reviewed. Mag 2.5 CBG: 171-197  Medications reviewed and include Novolog.  Weight history reviewed. Patient has had < 1% weight loss over the past 4 months.  NUTRITION - FOCUSED PHYSICAL EXAM:  Flowsheet Row Most Recent Value  Orbital Region No depletion  Upper Arm Region No depletion  Thoracic and Lumbar Region No depletion  Buccal Region No depletion   Temple Region Mild depletion  Clavicle Bone Region Mild depletion  Clavicle and Acromion Bone Region No depletion  Scapular Bone Region No depletion  Dorsal Hand No depletion  Patellar Region Mild depletion  Anterior Thigh Region Mild depletion  Posterior Calf Region Mild depletion  Edema (RD Assessment) None  Hair Reviewed  Eyes Reviewed  Mouth Reviewed  Skin Reviewed  Nails Reviewed       Diet Order:   Diet Order             Diet heart healthy/carb modified Room service appropriate? Yes with Assist; Fluid consistency: Thin  Diet effective now           Diet - low sodium heart healthy           Diet general                   EDUCATION NEEDS:   No education needs have been identified at this time  Skin:  Skin Assessment: Reviewed RN Assessment  Last BM:  2/3  Height:   Ht Readings from Last 1 Encounters:  09/02/21 5\' 2"  (1.575 m)    Weight:   Wt Readings from Last 1 Encounters:  09/07/21 67.1 kg     BMI:  Body mass index is 27.06 kg/m.  Estimated Nutritional Needs:   Kcal:  1800-2000  Protein:  100-115 gm  Fluid:  >/= 1.8 L    Lucas Mallow RD, LDN, CNSC Please refer to Amion for contact information.

## 2021-09-07 NOTE — Telephone Encounter (Signed)
Requested breast prognostic panel to be done on pleural fluid.

## 2021-09-08 DIAGNOSIS — J90 Pleural effusion, not elsewhere classified: Secondary | ICD-10-CM | POA: Diagnosis not present

## 2021-09-08 DIAGNOSIS — N6320 Unspecified lump in the left breast, unspecified quadrant: Secondary | ICD-10-CM | POA: Diagnosis not present

## 2021-09-08 DIAGNOSIS — N179 Acute kidney failure, unspecified: Secondary | ICD-10-CM | POA: Diagnosis not present

## 2021-09-08 DIAGNOSIS — D649 Anemia, unspecified: Secondary | ICD-10-CM | POA: Diagnosis not present

## 2021-09-08 LAB — GLUCOSE, CAPILLARY: Glucose-Capillary: 170 mg/dL — ABNORMAL HIGH (ref 70–99)

## 2021-09-08 MED ORDER — ENSURE ENLIVE PO LIQD: 237.0000 mL | Freq: Two times a day (BID) | ORAL | 0 refills | Status: AC

## 2021-09-08 MED ORDER — ADULT MULTIVITAMIN W/MINERALS CH: 1.0000 | ORAL_TABLET | Freq: Every day | ORAL | 0 refills | Status: DC

## 2021-09-08 MED ORDER — ACETAMINOPHEN 325 MG PO TABS: 650.0000 mg | ORAL_TABLET | Freq: Four times a day (QID) | ORAL | Status: DC | PRN

## 2021-09-08 MED ORDER — OXYCODONE HCL 5 MG PO TABS
5.0000 mg | ORAL_TABLET | Freq: Every day | ORAL | 0 refills | Status: DC | PRN
Start: 2021-09-08 — End: 2021-09-21

## 2021-09-08 NOTE — Telephone Encounter (Signed)
Spoke with the pt's daughter and scheduled ov with cxr 09/21/21.

## 2021-09-08 NOTE — Progress Notes (Signed)
Occupational Therapy Treatment Patient Details Name: Heather Rogers MRN: [id number] DOB: [birth year] Today's Date: 09/08/2021   History of present illness 76 y.o. female presents to [facility] hospital on 09/02/2021 with SOB, found to have a large left-sided pleural effusion. Further work-up revealed exudative pleural effusion- likely due to metastatic breast cancer. Chest tube 09/03/2021-09/06/21. PleurX catheter placement 2/9. PMH includes CVA, DMII, HTN.   OT comments  Pt making progress with functional goals. Pt eager to d/c home today as d/c from yesterday was delayed. Pt assisted with bathing, dressing, grooming and oral hygiene at sink with Sup, min guard A for LB and min A to don clean socks.    Recommendations for follow up therapy are one component of a multi-disciplinary discharge planning process, led by the attending physician.  Recommendations may be updated based on patient status, additional functional criteria and insurance authorization.    Follow Up Recommendations  No OT follow up    Assistance Recommended at Discharge Intermittent Supervision/Assistance  Patient can return home with the following  A little help with bathing/dressing/bathroom;A little help with walking and/or transfers;Assistance with cooking/housework   Equipment Recommendations  Tub/shower bench    Recommendations for Other Services      Precautions / Restrictions Precautions Precautions: Fall Restrictions Weight Bearing Restrictions: No       Mobility Bed Mobility               General bed mobility comments: pt seated at sink upon arrival    Transfers Overall transfer level: Needs assistance   Transfers: Sit to/from Stand Sit to Stand: Supervision           General transfer comment: supervision for safety, increased time     Balance Overall balance assessment: Mild deficits observed, not formally tested Sitting-balance support: No upper extremity supported, Feet supported Sitting  balance-Leahy Scale: Good     Standing balance support: No upper extremity supported, During functional activity Standing balance-Leahy Scale: Fair                             ADL either performed or assessed with clinical judgement   ADL Overall ADL's : Needs assistance/impaired Eating/Feeding: Independent   Grooming: Wash/dry hands;Wash/dry face;Oral care;Set up;Supervision/safety;Standing   Upper Body Bathing: Set up;Sitting   Lower Body Bathing: Min guard;Supervison/ safety;Sitting/lateral leans;Sit to/from stand           Toilet Transfer: Ambulation;Supervision/safety;Regular Toilet;Grab bars   Toileting- Water quality scientist and Hygiene: Sit to/from stand;Supervision/safety       Functional mobility during ADLs: Supervision/safety      Extremity/Trunk Assessment Upper Extremity Assessment Upper Extremity Assessment: Generalized weakness   Lower Extremity Assessment Lower Extremity Assessment: Defer to PT evaluation   Cervical / Trunk Assessment Cervical / Trunk Assessment: Normal    Vision Baseline Vision/History: 1 Wears glasses Ability to See in Adequate Light: 0 Adequate Patient Visual Report: No change from baseline     Perception     Praxis      Cognition Arousal/Alertness: Awake/alert Behavior During Therapy: WFL for tasks assessed/performed Overall Cognitive Status: Within Functional Limits for tasks assessed                                          Exercises      Shoulder Instructions       General Comments VSS on  RA    Pertinent Vitals/ Pain       Pain Assessment Pain Assessment: Faces Faces Pain Scale: Hurts little more Pain Location: L flank Pain Descriptors / Indicators: Grimacing, Guarding, Sore Pain Intervention(s): Monitored during session, Repositioned  Home Living                                          Prior Functioning/Environment              Frequency  Min  2X/week        Progress Toward Goals  OT Goals(current goals can now be found in the care plan section)  Progress towards OT goals: Progressing toward goals     Plan Discharge plan remains appropriate;Frequency remains appropriate    Co-evaluation                 AM-PAC OT "6 Clicks" Daily Activity     Outcome Measure   Help from another person eating meals?: None Help from another person taking care of personal grooming?: None Help from another person toileting, which includes using toliet, bedpan, or urinal?: A Little Help from another person bathing (including washing, rinsing, drying)?: A Little Help from another person to put on and taking off regular upper body clothing?: None Help from another person to put on and taking off regular lower body clothing?: A Little 6 Click Score: 21    End of Session Equipment Utilized During Treatment: Gait belt  OT Visit Diagnosis: Other abnormalities of gait and mobility (R26.89);Muscle weakness (generalized) (M62.81)   Activity Tolerance Patient tolerated treatment well   Patient Left with call bell/phone within reach;in chair   Nurse Communication          Time: 9449-6759 OT Time Calculation (min): 33 min  Charges: OT General Charges $OT Visit: 1 Visit OT Treatments $Self Care/Home Management : 8-22 mins $Therapeutic Activity: 8-22 mins    Britt Bottom 09/08/2021, 1:21 PM

## 2021-09-08 NOTE — Progress Notes (Signed)
Physical Therapy Treatment Patient Details Name: Heather Rogers MRN: [id number] DOB: 12/15/1945 Today's Date: 09/08/2021   History of Present Illness 76 y.o. female presents to [facility] hospital on 09/02/2021 with SOB, found to have a large left-sided pleural effusion. Further work-up revealed exudative pleural effusion- likely due to metastatic breast cancer. Chest tube 09/03/2021-09/06/21. PleurX catheter placement 2/9. PMH includes CVA, DMII, HTN.    PT Comments    Pt received in recliner. She required supervision transfers and in room ambulation without AD. Declined hallway ambulation due to need for BM in bathroom then wash up in preparation for d/c home.    Recommendations for follow up therapy are one component of a multi-disciplinary discharge planning process, led by the attending physician.  Recommendations may be updated based on patient status, additional functional criteria and insurance authorization.  Follow Up Recommendations  No PT follow up     Assistance Recommended at Discharge PRN  Patient can return home with the following A little help with walking and/or transfers;Help with stairs or ramp for entrance;A little help with bathing/dressing/bathroom   Equipment Recommendations  None recommended by PT    Recommendations for Other Services       Precautions / Restrictions Precautions Precautions: Fall     Mobility  Bed Mobility               General bed mobility comments: Pt up in recliner.    Transfers Overall transfer level: Needs assistance Equipment used: None Transfers: Sit to/from Stand Sit to Stand: Supervision           General transfer comment: supervision for safety, increased time    Ambulation/Gait Ambulation/Gait assistance: Supervision Gait Distance (Feet): 25 Feet Assistive device: None Gait Pattern/deviations: Step-through pattern, Decreased stride length Gait velocity: decreased     General Gait Details: supervision for  safety, ambulation limited to in room due to pt need to use bathroom and get washed up prior to d/c   Stairs             Wheelchair Mobility    Modified Rankin (Stroke Patients Only)       Balance Overall balance assessment: Mild deficits observed, not formally tested                                          Cognition Arousal/Alertness: Awake/alert Behavior During Therapy: WFL for tasks assessed/performed Overall Cognitive Status: Within Functional Limits for tasks assessed                                          Exercises      General Comments General comments (skin integrity, edema, etc.): VSS on RA      Pertinent Vitals/Pain Pain Assessment Pain Assessment: Faces Faces Pain Scale: Hurts little more Pain Location: L flank Pain Descriptors / Indicators: Grimacing, Guarding, Sore Pain Intervention(s): Monitored during session, Repositioned    Home Living                          Prior Function            PT Goals (current goals can now be found in the care plan section) Acute Rehab PT Goals Patient Stated Goal: home Progress towards PT goals: Progressing toward  goals    Frequency    Min 3X/week      PT Plan Current plan remains appropriate    Co-evaluation              AM-PAC PT "6 Clicks" Mobility   Outcome Measure  Help needed turning from your back to your side while in a flat bed without using bedrails?: None Help needed moving from lying on your back to sitting on the side of a flat bed without using bedrails?: None Help needed moving to and from a bed to a chair (including a wheelchair)?: A Little Help needed standing up from a chair using your arms (e.g., wheelchair or bedside chair)?: A Little Help needed to walk in hospital room?: A Little Help needed climbing 3-5 steps with a railing? : A Little 6 Click Score: 20    End of Session   Activity Tolerance: Patient tolerated  treatment well Patient left: in chair;with call bell/phone within reach Nurse Communication: Mobility status PT Visit Diagnosis: Other abnormalities of gait and mobility (R26.89)     Time: 1638-4665 PT Time Calculation (min) (ACUTE ONLY): 16 min  Charges:  $Gait Training: 8-22 mins                     Lorrin Goodell, PT  Office # [phone] Pager [phone]    Lorriane Shire 09/08/2021, 11:48 AM

## 2021-09-08 NOTE — Progress Notes (Signed)
Nsg Discharge Note  Admit Date:  09/02/2021 Discharge date: 09/08/2021   Heather Rogers to be D/C'd Home with [facility] per MD order.  AVS completed. Patient/caregiver able to verbalize understanding.  Discharge Medication: Allergies as of 09/08/2021       Reactions   Sulfa Antibiotics    Itching        Medication List     STOP taking these medications    amLODipine 10 MG tablet Commonly known as: NORVASC   valsartan-hydrochlorothiazide 320-12.5 MG tablet Commonly known as: DIOVAN-HCT       TAKE these medications    Accu-Chek FastClix Lancets Misc Check blood sugar fasting and before meals and again if pt feels bad (symptoms of hypo).   Accu-Chek Guide test strip Generic drug: glucose blood USE TO CHECK FASTING BLOOD SUGAR AND BEFORE MEALS AND AGAIN IF PATIENT FEELS BAD; SYMPTOMS OF HYPOGLYCEMIA   Accu-Chek Guide w/Device Kit 1 each by Does not apply route as directed.   acetaminophen 325 MG tablet Commonly known as: TYLENOL Take 2 tablets (650 mg total) by mouth every 6 (six) hours as needed for mild pain (or Fever >/= 101).   allopurinol 100 MG tablet Commonly known as: ZYLOPRIM TAKE ONE TABLET BY MOUTH DAILY   aspirin EC 81 MG tablet Take 81 mg by mouth daily. Swallow whole.   atorvastatin 80 MG tablet Commonly known as: LIPITOR TAKE ONE TABLET BY MOUTH EVERY EVENING TO LOWER CHOLESTEROL What changed:  how much to take how to take this when to take this additional instructions   carvedilol 25 MG tablet Commonly known as: COREG Take 1 tablet (25 mg total) by mouth 2 (two) times daily with a meal.   cholecalciferol 25 MCG (1000 UNIT) tablet Commonly known as: VITAMIN D3 Take 1,000 Units by mouth daily.   feeding supplement Liqd Take 237 mLs by mouth 2 (two) times daily between meals.   ferrous sulfate 325 (65 FE) MG EC tablet Take 1 tablet (325 mg total) by mouth in the morning and at bedtime.   gabapentin 100 MG capsule Commonly known as:  NEURONTIN Take 1 capsule (100 mg total) by mouth 3 (three) times daily.   Metamucil Fiber Chew Chew 1 tablet by mouth daily.   metFORMIN 500 MG tablet Commonly known as: GLUCOPHAGE TAKE ONE TABLET BY MOUTH TWICE A DAY WITH A MEAL What changed:  how much to take how to take this when to take this additional instructions   multivitamin with minerals Tabs tablet Take 1 tablet by mouth daily. Start taking on: September 09, 2021   oxyCODONE 5 MG immediate release tablet Commonly known as: Roxicodone Take 1 tablet (5 mg total) by mouth daily as needed (30 minutes before drainage).               Durable Medical Equipment  (From admission, onward)           Start     Ordered   09/08/21 0834  For home use only DME Chest tube pluerex  Once       Comments: Had small-moderate effusion and developed some chest tightness with drainage of only about 100cc. In light of that would discharge with following drainage plan: -  she can take oxycodone 5 mg as needed 30 minutes before drainage - would start draining every other day up to 1L maximum or until develops chest tightness (start drainage 09/09/21). If drains >300cc then continue draining every other day. If drains <300cc then drain every  3 days. If drains <100cc then drain every 4 days. If drains <50cc on two occasions then call [facility] at [phone] or send my chart message so we can evaluate for possible removal or troubleshooting.   09/08/21 0835              Discharge Care Instructions  (From admission, onward)           Start     Ordered   09/07/21 0000  No dressing needed        09/07/21 1120            Discharge Assessment: Vitals:   09/08/21 0400 09/08/21 0747  BP: (!) 120/58 117/66  Pulse: 81   Resp: (!) 22   Temp: 98.4 F (36.9 C) 98.9 F (37.2 C)  SpO2: 95%    Skin clean, dry and intact without evidence of skin break down, no evidence of skin tears noted. IV catheter discontinued  intact. Site without signs and symptoms of complications - no redness or edema noted at insertion site, patient denies c/o pain - only slight tenderness at site.  Dressing with slight pressure applied.  D/c Instructions-Education: Discharge instructions given to patient/family with verbalized understanding. D/c education completed with patient/family including follow up instructions, medication list, d/c activities limitations if indicated, with other d/c instructions as indicated by MD - patient able to verbalize understanding, all questions fully answered. Patient instructed to return to ED, call 911, or call MD for any changes in condition.  Patient escorted via [place], and D/C home via private auto.  Atilano Ina, RN 09/08/2021 12:00 PM

## 2021-09-10 ENCOUNTER — Encounter (HOSPITAL_COMMUNITY): Payer: Self-pay | Admitting: Internal Medicine

## 2021-09-11 ENCOUNTER — Telehealth: Payer: Self-pay

## 2021-09-11 ENCOUNTER — Other Ambulatory Visit: Payer: Self-pay

## 2021-09-11 NOTE — Patient Outreach (Signed)
[facility]) Care Management  09/11/2021  Heather Rogers 11/16/1945 [id number]  [employer] member referred for transition of care needs. Request assigned to Enzo Montgomery, RN for follow up.  Ina Homes [facility] Management Assistant [phone]

## 2021-09-11 NOTE — Telephone Encounter (Signed)
Transition Care Management Follow-up Telephone Call  Call completed with patient's daughter, [name] -  DPR on file to speak with her.  Date of discharge and from where: 09/08/2021, [facility] How have you been since you were released from the hospital? [name] said that her mother is feeling fine.  Any questions or concerns? No  Items Reviewed: Did the pt receive and understand the discharge instructions provided? Yes  Medications obtained and verified? Yes  - she has all of her medications and [name] did not have any questions about the med regime. She has purchased the feeding supplements for her mother.  Other? No  Any new allergies since your discharge? No  Dietary orders reviewed? No Do you have support at home? Yes  - her daughter and her grandson.   Home Care and Equipment/Supplies: Were home health services ordered? yes If so, what is the name of the agency? Alvis Lemmings - RN  Has the agency set up a time to come to the patient's home? Yes - this afternoon Were any new equipment or medical supplies ordered?  Yes: canisters to drain pleurex catheter.  What is the name of the medical supply agency? She received them at the hospital Were you able to get the supplies/equipment? yes Do you have any questions related to the use of the equipment or supplies? No  [name] drained the pleurex catheter 09/09/2021: 550 cc and will drain it again today.  She has a glucometer. Blood sugar this morning prior to eating was 208.  Functional Questionnaire: (I = Independent and D = Dependent) ADLs: ambulating without an assistive device. Her daughter provides assistance with ADLs as needed [name] also sets up patient's medications in a pill box.   Follow up appointments reviewed:  PCP Hospital f/u appt confirmed? Yes  Scheduled to see Dr Margarita Rana on 09/27/2021 Specialist Hospital f/u appt confirmed? Yes  Scheduled to see oncology - 09/14/2021, cardiology - 10/09/2021.  Are transportation arrangements  needed? No  If their condition worsens, is the pt aware to call PCP or go to the Emergency Dept.? Yes Was the patient provided with contact information for the PCP's office or ED? Yes Was to pt encouraged to call back with questions or concerns? Yes

## 2021-09-11 NOTE — Patient Outreach (Signed)
[facility]) Care Management  09/11/2021  CALENA SALEM [birth year] [id number]     Transition of Care Referral  Referral Date: 09/11/2021 Referral Source: Discharge Report Date of Discharge: 09/08/2021 Facility: [facility]   Referral received. Transition of care calls being completed via EMMI-automated calls.      Plan: RN CM will close referral.   Enzo Montgomery, RN,BSN,CCM [place] Management Telephonic Care Management Coordinator Direct Phone: [phone] Toll Free: [phone] Fax: [phone]

## 2021-09-11 NOTE — Telephone Encounter (Signed)
From the discharge call:  Call completed with patient's daughter, [name].    She said that her mother is feeling fine. she has all of her medications and [name] did not have any questions about the med regime. She has purchased the feeding supplements.  [name] drained the pleurex catheter 09/09/2021: 550 cc and will drain it again today. The home health nurse is also planned to visit this afternoon.   She has a glucometer. Blood sugar this morning prior to eating was 208. This was the first time her blood sugar was checked since returning home.  Scheduled to see Dr Margarita Rana on 09/27/2021  Scheduled to see oncology - 09/14/2021, cardiology - 10/09/2021.

## 2021-09-13 ENCOUNTER — Other Ambulatory Visit: Payer: Self-pay

## 2021-09-13 DIAGNOSIS — N6320 Unspecified lump in the left breast, unspecified quadrant: Secondary | ICD-10-CM

## 2021-09-13 NOTE — Progress Notes (Signed)
Patient Care Team: Charlott Rakes, MD as PCP - General (Family Medicine)  DIAGNOSIS:    ICD-10-CM   1. Metastatic breast cancer ([place])  C50.919       SUMMARY OF ONCOLOGIC HISTORY: Oncology History  Breast mass, left  09/04/2021 Initial Diagnosis   Breast mass, left   Metastatic breast cancer ([place])  09/14/2021 Initial Diagnosis   Large neglected left breast cancer for several years.  Hospitalization 09/02/2021-09/08/2021: Shortness of breath, large pleural effusion, thoracentesis, pleural fluid cytology: Metastatic adenocarcinoma breast origin ER/PR positive HER2 negative     CHIEF COMPLIANT: Follow-up of left breast mass, bone lesions, pulmonary nodules, pleural effusion  INTERVAL HISTORY: Heather Rogers is a 76 y.o. with above-mentioned history of left breast mass, bone lesions, pulmonary nodules, pleural effusion. She presents to the clinic today for follow-up.  She is brought in today by her daughter.  She tells [place] that the breathing has improved since the thoracentesis.  ALLERGIES:  is allergic to sulfa antibiotics.  MEDICATIONS:  Current Outpatient Medications  Medication Sig Dispense Refill   Accu-Chek FastClix Lancets MISC Check blood sugar fasting and before meals and again if pt feels bad (symptoms of hypo). 100 each 12   acetaminophen (TYLENOL) 325 MG tablet Take 2 tablets (650 mg total) by mouth every 6 (six) hours as needed for mild pain (or Fever >/= 101).     allopurinol (ZYLOPRIM) 100 MG tablet TAKE ONE TABLET BY MOUTH DAILY (Patient taking differently: Take 100 mg by mouth daily.) 90 tablet 1   aspirin EC 81 MG tablet Take 81 mg by mouth daily. Swallow whole.     atorvastatin (LIPITOR) 80 MG tablet TAKE ONE TABLET BY MOUTH EVERY EVENING TO LOWER CHOLESTEROL (Patient taking differently: Take 80 mg by mouth every evening.) 90 tablet 1   Blood Glucose Monitoring Suppl (ACCU-CHEK GUIDE) w/Device KIT 1 each by Does not apply route as directed. 1 kit 0   carvedilol  (COREG) 25 MG tablet Take 1 tablet (25 mg total) by mouth 2 (two) times daily with a meal. 180 tablet 1   cholecalciferol (VITAMIN D3) 25 MCG (1000 UNIT) tablet Take 1,000 Units by mouth daily.     feeding supplement (ENSURE ENLIVE / ENSURE PLUS) LIQD Take 237 mLs by mouth 2 (two) times daily between meals. 14220 mL 0   ferrous sulfate 325 (65 FE) MG EC tablet Take 1 tablet (325 mg total) by mouth in the morning and at bedtime. 60 tablet 1   gabapentin (NEURONTIN) 100 MG capsule Take 1 capsule (100 mg total) by mouth 3 (three) times daily. 270 capsule 1   glucose blood (ACCU-CHEK GUIDE) test strip USE TO CHECK FASTING BLOOD SUGAR AND BEFORE MEALS AND AGAIN IF PATIENT FEELS BAD; SYMPTOMS OF HYPOGLYCEMIA 100 strip 11   Metamucil Fiber CHEW Chew 1 tablet by mouth daily.     metFORMIN (GLUCOPHAGE) 500 MG tablet TAKE ONE TABLET BY MOUTH TWICE A DAY WITH A MEAL (Patient taking differently: Take 500 mg by mouth 2 (two) times daily with a meal.) 180 tablet 1   Multiple Vitamin (MULTIVITAMIN WITH MINERALS) TABS tablet Take 1 tablet by mouth daily. 30 tablet 0   oxyCODONE (ROXICODONE) 5 MG immediate release tablet Take 1 tablet (5 mg total) by mouth daily as needed (30 minutes before drainage). 20 tablet 0   No current facility-administered medications for this visit.    PHYSICAL EXAMINATION: ECOG PERFORMANCE STATUS: 1 - Symptomatic but completely ambulatory  Vitals:   09/14/21 1038  BP: (!) 160/60  Pulse: 85  Resp: 19  Temp: 97.7 F (36.5 C)  SpO2: 99%   Filed Weights   09/14/21 1038  Weight: 139 lb 4.8 oz (63.2 kg)      LABORATORY DATA:  I have reviewed the data as listed CMP Latest Ref Rng & Units 09/14/2021 09/07/2021 09/05/2021  Glucose 70 - 99 mg/dL 127(H) 152(H) 153(H)  BUN 8 - 23 mg/dL 18 38(H) 52(H)  Creatinine 0.44 - 1.00 mg/dL 0.93 1.20(H) 1.39(H)  Sodium 135 - 145 mmol/L 139 136 137  Potassium 3.5 - 5.1 mmol/L 4.7 4.2 4.0  Chloride 98 - 111 mmol/L 107 106 107  CO2 22 - 32 mmol/L  27 21(L) 20(L)  Calcium 8.9 - 10.3 mg/dL 10.2 8.8(L) 9.0  Total Protein 6.5 - 8.1 g/dL 7.2 - -  Total Bilirubin 0.3 - 1.2 mg/dL 0.2(L) - -  Alkaline Phos 38 - 126 U/L 70 - -  AST 15 - 41 U/L 38 - -  ALT 0 - 44 U/L 55(H) - -    Lab Results  Component Value Date   WBC 6.8 09/14/2021   HGB 8.9 (L) 09/14/2021   HCT 28.5 (L) 09/14/2021   MCV 71.1 (L) 09/14/2021   PLT 502 (H) 09/14/2021   NEUTROABS 5.0 09/14/2021    ASSESSMENT & PLAN:  Metastatic breast cancer ([place]) 09/14/2021:Large neglected left breast cancer for several years.  Hospitalization 09/02/2021-09/08/2021: Shortness of breath, large pleural effusion, thoracentesis, pleural fluid cytology: Metastatic adenocarcinoma breast origin ER 40%, PR 30%, Ki-67 5%, HER2 2+ by IHC, FISH negative ratio 1.43   Counseling: I discussed with the patient that the presence of pleural effusion is indicated of metastatic disease.  I would like to get a whole-body PET CT scan for further evaluation of the rest of the body.  Treatment plan: 1.  [name] with letrozole (will start at 50 mg p.o. twice daily and will have John to titrate the dosage upwards. 2. PET CT scan  [name] counseling: Discussed risks and benefits of [name] including the risk of diarrhea and cytopenias, interstitial lung disease as well as severe fatigue and LFT changes. She understands that the goal of treatment is palliation. She agreed to participate in our metastatic breast cancer blood draw study.  Return to clinic in 2 weeks for follow-up with our pharmacy provider Cindra Eves    No orders of the defined types were placed in this encounter.  The patient has a good understanding of the overall plan. she agrees with it. she will call with any problems that may develop before the next visit here.  Total time spent: 45 mins including face to face time and time spent for planning, charting and coordination of care  Rulon Eisenmenger, MD, MPH 09/14/2021  I, Thana Ates, am acting as scribe for Dr. Nicholas Lose.  I have reviewed the above documentation for accuracy and completeness, and I agree with the above.

## 2021-09-14 ENCOUNTER — Encounter: Payer: Self-pay | Admitting: Emergency Medicine

## 2021-09-14 ENCOUNTER — Telehealth: Payer: Self-pay

## 2021-09-14 ENCOUNTER — Other Ambulatory Visit (HOSPITAL_COMMUNITY): Payer: Self-pay

## 2021-09-14 ENCOUNTER — Other Ambulatory Visit: Payer: Self-pay

## 2021-09-14 ENCOUNTER — Inpatient Hospital Stay (HOSPITAL_BASED_OUTPATIENT_CLINIC_OR_DEPARTMENT_OTHER): Payer: Medicare HMO | Admitting: Hematology and Oncology

## 2021-09-14 ENCOUNTER — Inpatient Hospital Stay: Payer: Medicare HMO | Attending: Hematology and Oncology

## 2021-09-14 ENCOUNTER — Inpatient Hospital Stay: Payer: Medicare HMO

## 2021-09-14 DIAGNOSIS — C50919 Malignant neoplasm of unspecified site of unspecified female breast: Secondary | ICD-10-CM | POA: Diagnosis not present

## 2021-09-14 DIAGNOSIS — Z170 Estrogen receptor positive status [ER+]: Secondary | ICD-10-CM | POA: Diagnosis not present

## 2021-09-14 DIAGNOSIS — N6320 Unspecified lump in the left breast, unspecified quadrant: Secondary | ICD-10-CM

## 2021-09-14 DIAGNOSIS — J90 Pleural effusion, not elsewhere classified: Secondary | ICD-10-CM | POA: Diagnosis not present

## 2021-09-14 DIAGNOSIS — C50912 Malignant neoplasm of unspecified site of left female breast: Secondary | ICD-10-CM | POA: Diagnosis not present

## 2021-09-14 HISTORY — DX: Malignant neoplasm of unspecified site of unspecified female breast: C50.919

## 2021-09-14 LAB — CBC WITH DIFFERENTIAL (CANCER CENTER ONLY)
Abs Immature Granulocytes: 0.02 10*3/uL (ref 0.00–0.07)
Basophils Absolute: 0 10*3/uL (ref 0.0–0.1)
Basophils Relative: 0 %
Eosinophils Absolute: 0.1 10*3/uL (ref 0.0–0.5)
Eosinophils Relative: 2 %
HCT: 28.5 % — ABNORMAL LOW (ref 36.0–46.0)
Hemoglobin: 8.9 g/dL — ABNORMAL LOW (ref 12.0–15.0)
Immature Granulocytes: 0 %
Lymphocytes Relative: 16 %
Lymphs Abs: 1.1 10*3/uL (ref 0.7–4.0)
MCH: 22.2 pg — ABNORMAL LOW (ref 26.0–34.0)
MCHC: 31.2 g/dL (ref 30.0–36.0)
MCV: 71.1 fL — ABNORMAL LOW (ref 80.0–100.0)
Monocytes Absolute: 0.5 10*3/uL (ref 0.1–1.0)
Monocytes Relative: 8 %
Neutro Abs: 5 10*3/uL (ref 1.7–7.7)
Neutrophils Relative %: 74 %
Platelet Count: 502 10*3/uL — ABNORMAL HIGH (ref 150–400)
RBC: 4.01 MIL/uL (ref 3.87–5.11)
RDW: 14.8 % (ref 11.5–15.5)
WBC Count: 6.8 10*3/uL (ref 4.0–10.5)
nRBC: 0 % (ref 0.0–0.2)

## 2021-09-14 LAB — CMP (CANCER CENTER ONLY)
ALT: 55 U/L — ABNORMAL HIGH (ref 0–44)
AST: 38 U/L (ref 15–41)
Albumin: 3.2 g/dL — ABNORMAL LOW (ref 3.5–5.0)
Alkaline Phosphatase: 70 U/L (ref 38–126)
Anion gap: 5 (ref 5–15)
BUN: 18 mg/dL (ref 8–23)
CO2: 27 mmol/L (ref 22–32)
Calcium: 10.2 mg/dL (ref 8.9–10.3)
Chloride: 107 mmol/L (ref 98–111)
Creatinine: 0.93 mg/dL (ref 0.44–1.00)
GFR, Estimated: >60 mL/min (ref >60)
Glucose, Bld: 127 mg/dL — ABNORMAL HIGH (ref 70–99)
Potassium: 4.7 mmol/L (ref 3.5–5.1)
Sodium: 139 mmol/L (ref 135–145)
Total Bilirubin: 0.2 mg/dL — ABNORMAL LOW (ref 0.3–1.2)
Total Protein: 7.2 g/dL (ref 6.5–8.1)

## 2021-09-14 LAB — RESEARCH LABS

## 2021-09-14 MED ORDER — ABEMACICLIB 50 MG PO TABS
50.0000 mg | ORAL_TABLET | Freq: Two times a day (BID) | ORAL | 0 refills | Status: DC
  Filled 2021-09-14: qty 70, 35d supply, fill #0

## 2021-09-14 MED ORDER — LETROZOLE 2.5 MG PO TABS: 2.5000 mg | ORAL_TABLET | Freq: Every day | ORAL | 3 refills | Status: DC

## 2021-09-14 MED ORDER — LETROZOLE 2.5 MG PO TABS: 2.5000 mg | ORAL_TABLET | Freq: Every day | ORAL | 0 refills | Status: DC

## 2021-09-14 NOTE — Assessment & Plan Note (Signed)
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  Hospitalization 09/02/2021-09/08/2021: Shortness of breath, large pleural effusion, thoracentesis, pleural fluid cytology: Metastatic adenocarcinoma breast origin ER 40%, PR 30%, Ki-67 5%, HER2 2+ by IHC, FISH negative ratio 1.43   Counseling: I discussed with the patient that the presence of pleural effusion is indicated of metastatic disease.  I would like to get a whole-body PET CT scan for further evaluation of the rest of the body.  Treatment plan: 1.  [name] with letrozole (will start at 50 mg p.o. twice daily and will have John to titrate the dosage upwards. 2. PET CT scan  [place] counseling: Discussed risks and benefits of [name] including the risk of diarrhea and cytopenias, interstitial lung disease as well as severe fatigue and LFT changes.  Return to clinic in 2 weeks for follow-up with our pharmacy provider Jenny Reichmann

## 2021-09-14 NOTE — Research (Signed)
[facility] PO 1206 - Procurement of Human Biospecimens for the Discovery and Validation of Biomarkers for the Prediction, Diagnosis, and Management of Disease  09/14/21  Informed Consent:  Patient Heather Rogers was identified by Dr. Lindi Adie as a potential candidate for the above listed study.  This Clinical Research Coordinator met with Heather Rogers, MRN [id number], on 09/14/21 in a manner and location that ensures patient privacy to discuss participation in the above listed research study.  Patient is Accompanied by her family member .  A copy of the informed consent document and separate HIPAA Authorization was provided to the patient.  Patient reads, speaks, and understands [place].    Patient was provided with the business card of this Coordinator and encouraged to contact the research team with any questions.  Patient was provided the option of taking informed consent documents home to review and was encouraged to review at their convenience with their support network, including other care providers. Patient is comfortable with making a decision regarding study participation today.  As outlined in the informed consent form, this Coordinator and Heather Rogers discussed the purpose of the research study, the investigational nature of the study, study procedures and requirements for study participation, potential risks and benefits of study participation, as well as alternatives to participation. This study is not blinded. The patient understands participation is voluntary and they may withdraw from study participation at any time.  This study does not involve randomization.  This study does not involve an investigational drug or device. This study does not involve a placebo. Patient understands enrollment is pending full eligibility review.   Confidentiality and how the patient's information will be used as part of study participation were discussed.  Patient was informed there is  reimbursement provided for their time and effort spent on trial participation.  The patient is encouraged to discuss research study participation with their insurance provider to determine what costs they may incur as part of study participation, including research related injury.    All questions were answered to patient's satisfaction.  The informed consent and separate HIPAA Authorization was reviewed page by page.  The patient's mental and emotional status is appropriate to provide informed consent, and the patient verbalizes an understanding of study participation.  Patient has agreed to participate in the above listed research study and has voluntarily signed the informed consent version 1.0 revised 05/05/2021, and separate HIPAA Authorization, version 5 revised 07/15/2019  on 09/14/21 at 11:50AM.  The patient was provided with a copy of the signed informed consent form and separate HIPAA Authorization for their reference.  No study specific procedures were obtained prior to the signing of the informed consent document.  Approximately 15 minutes were spent with the patient reviewing the informed consent documents.    Specimen Collection:  The patient was brought to the lab to have her blood specimen collected. Specimen was collected at 12:12pm.   Gift Card:  The patient was given a $50 gift card for her participation in this study.  The patient was thanked for her time and participation, and advised to call with any questions.   Clabe Seal Clinical Research Coordinator I  09/14/21 12:09 PM

## 2021-09-14 NOTE — Telephone Encounter (Signed)
Oral Oncology Patient Advocate Encounter   Received notification from [facility] that prior authorization for [name] is required.   PA submitted on CoverMyMeds Key [id number] Status is pending   Oral Oncology Clinic will continue to follow.  [place] Patient [place] Phone [phone] Fax [phone] 09/14/2021 11:56 AM

## 2021-09-14 NOTE — Telephone Encounter (Addendum)
Oral Oncology Pharmacist Encounter  Received new prescription for abemaciclib ([name]) for the treatment of metastatic breast cancer in conjunction with letrozole, planned duration until disease progression or unacceptable toxicity.  Labs from 09/14/21 (CBC, CMP) assessed, no interventions needed, will need to monitor LFTs.  Current medication list in Epic reviewed, DDIs with [name] identified:  -metformin: verzenio can increase concentration of metformin and will monitor  Evaluated chart and no patient barriers to medication adherence noted.   Patient agreement for treatment documented in MD note on 09/14/2021.  Prescription has been e-scribed to the [facility] for benefits analysis and approval.  Oral Oncology Clinic will continue to follow for insurance authorization, copayment issues, initial counseling and start date.  [name], PharmD Hematology/Oncology Clinical Pharmacist [facility] [phone] 09/14/2021 12:11 PM

## 2021-09-14 NOTE — Telephone Encounter (Signed)
Oral Oncology Patient Advocate Encounter  Prior Authorization for Heather Rogers has been approved.    PA# [id number] Effective dates: 09/14/21 through 03/13/22  Patients co-pay is $3296  Oral Oncology Clinic will continue to follow.   [place] Patient [place] Phone [phone] Fax [phone] 09/14/2021 1:31 PM

## 2021-09-15 ENCOUNTER — Other Ambulatory Visit (HOSPITAL_COMMUNITY): Payer: Self-pay

## 2021-09-19 ENCOUNTER — Telehealth: Payer: Self-pay

## 2021-09-19 LAB — CYTOLOGY - NON PAP

## 2021-09-19 NOTE — Telephone Encounter (Signed)
Oral Oncology Patient Advocate Encounter  Met patient in [place] to complete an application for [employer] Patient Assistance Program in an effort to reduce the patient's out of pocket expense for [name] to $0.    Application completed and faxed to [phone].   [employer] patient assistance phone number for follow up is [phone].   This encounter will be updated until final determination.   [place] Patient [place] Phone [phone] Fax [phone] 09/19/2021 12:47 PM

## 2021-09-20 ENCOUNTER — Other Ambulatory Visit: Payer: Self-pay

## 2021-09-20 ENCOUNTER — Encounter (HOSPITAL_COMMUNITY): Payer: Self-pay | Admitting: *Deleted

## 2021-09-20 DIAGNOSIS — E1141 Type 2 diabetes mellitus with diabetic mononeuropathy: Secondary | ICD-10-CM

## 2021-09-20 MED ORDER — ACCU-CHEK FASTCLIX LANCETS MISC: 12 refills | Status: DC

## 2021-09-20 MED ORDER — ACCU-CHEK GUIDE VI STRP: ORAL_STRIP | 11 refills | Status: DC

## 2021-09-20 NOTE — Progress Notes (Signed)
Synopsis: Referred for recurrent malignant pleural effusion by Charlott Rakes, MD  Subjective:   PATIENT ID: Heather Rogers GENDER: female DOB: [birth year], MRN: [id number]  Chief Complaint  Patient presents with   Follow-up    Here for suture removal. Denies any co's today.    76yF with history of metastatic breast cancer, left malignant pleural effusion s/p PleurX catheter placement 09/07/21.    Draining anywhere from 100-500cc at a time, following drainage instructions, logging with PleurX app. No CP with drainage. No fever, rash.   Otherwise pertinent review of systems is negative.  Past Medical History:  Diagnosis Date   Diabetes ([place])    Hypertension    Pneumonia due to COVID-19 virus 07/25/2020   Stroke (cerebrum) [facility])    Stroke ([place]) 05/15/2018     Family History  Problem Relation Age of Onset   Hypertension Mother    Stroke Mother    Hypertension Father      Past Surgical History:  Procedure Laterality Date   CHEST TUBE INSERTION Left 09/07/2021   Procedure: CHEST TUBE INSERTION;  Surgeon: Candee Furbish, MD;  Location: [facility] ENDOSCOPY;  Service: Pulmonary;  Laterality: Left;   EYE SURGERY Left 04/06/2020   Implant placed   EYE SURGERY Right 03/30/2020   Implant placed    hysterectomy     LOOP RECORDER INSERTION N/A 05/20/2018   Procedure: LOOP RECORDER INSERTION;  Surgeon: Thompson Grayer, MD;  Location: [place] CV LAB;  Service: Cardiovascular;  Laterality: N/A;   TEE WITHOUT CARDIOVERSION N/A 05/20/2018   Procedure: TRANSESOPHAGEAL ECHOCARDIOGRAM (TEE);  Surgeon: Sueanne Margarita, MD;  Location[facility] ENDOSCOPY;  Service: Cardiovascular;  Laterality: N/A;    Social History   Socioeconomic History   Marital status: Divorced    Spouse name: Not on file   Number of children: Not on file   Years of education: Not on file   Highest education level: Not on file  Occupational History   Not on file  Tobacco Use   Smoking status: Former    Packs/day: 1.00     Years: 15.00    Pack years: 15.00    Types: Cigarettes    Quit date: 07/30/1985    Years since quitting: 36.1   Smokeless tobacco: Never  Substance and Sexual Activity   Alcohol use: Never   Drug use: Never   Sexual activity: Not Currently  Other Topics Concern   Not on file  Social History Narrative   Lives in [place].   Social Determinants of Health   Financial Resource Strain: Not on file  Food Insecurity: Not on file  Transportation Needs: Not on file  Physical Activity: Not on file  Stress: Not on file  Social Connections: Not on file  Intimate Partner Violence: Not on file     Allergies  Allergen Reactions   Sulfa Antibiotics     Itching      Outpatient Medications Prior to Visit  Medication Sig Dispense Refill   Accu-Chek FastClix Lancets MISC Check blood sugar fasting and before meals and again if pt feels bad (symptoms of hypo). 100 each 12   allopurinol (ZYLOPRIM) 100 MG tablet TAKE ONE TABLET BY MOUTH DAILY (Patient taking differently: Take 100 mg by mouth daily.) 90 tablet 1   aspirin EC 81 MG tablet Take 81 mg by mouth daily. Swallow whole.     atorvastatin (LIPITOR) 80 MG tablet TAKE ONE TABLET BY MOUTH EVERY EVENING TO LOWER CHOLESTEROL (Patient taking differently: Take 80 mg  by mouth every evening.) 90 tablet 1   Blood Glucose Monitoring Suppl (ACCU-CHEK GUIDE) w/Device KIT 1 each by Does not apply route as directed. 1 kit 0   carvedilol (COREG) 25 MG tablet Take 1 tablet (25 mg total) by mouth 2 (two) times daily with a meal. 180 tablet 1   cholecalciferol (VITAMIN D3) 25 MCG (1000 UNIT) tablet Take 1,000 Units by mouth daily.     feeding supplement (ENSURE ENLIVE / ENSURE PLUS) LIQD Take 237 mLs by mouth 2 (two) times daily between meals. 14220 mL 0   ferrous sulfate 325 (65 FE) MG EC tablet Take 1 tablet (325 mg total) by mouth in the morning and at bedtime. 60 tablet 1   gabapentin (NEURONTIN) 100 MG capsule Take 1 capsule (100 mg total) by mouth 3  (three) times daily. 270 capsule 1   glucose blood (ACCU-CHEK GUIDE) test strip USE TO CHECK FASTING BLOOD SUGAR AND BEFORE MEALS AND AGAIN IF PATIENT FEELS BAD; SYMPTOMS OF HYPOGLYCEMIA 100 strip 11   letrozole (FEMARA) 2.5 MG tablet Take 1 tablet (2.5 mg total) by mouth daily. 90 tablet 3   Metamucil Fiber CHEW Chew 1 tablet by mouth daily.     metFORMIN (GLUCOPHAGE) 500 MG tablet TAKE ONE TABLET BY MOUTH TWICE A DAY WITH A MEAL (Patient taking differently: Take 500 mg by mouth 2 (two) times daily with a meal.) 180 tablet 1   Multiple Vitamin (MULTIVITAMIN WITH MINERALS) TABS tablet Take 1 tablet by mouth daily. 30 tablet 0   abemaciclib ([name]) 50 MG tablet Take 1 tablet (50 mg total) by mouth 2 (two) times daily. Swallow tablets whole. Do not chew, crush, or split tablets before swallowing. Start as soon as you get it (Patient not taking: Reported on 09/21/2021) 60 tablet 0   acetaminophen (TYLENOL) 325 MG tablet Take 2 tablets (650 mg total) by mouth every 6 (six) hours as needed for mild pain (or Fever >/= 101).     letrozole (FEMARA) 2.5 MG tablet Take 1 tablet (2.5 mg total) by mouth daily. 30 tablet 0   oxyCODONE (ROXICODONE) 5 MG immediate release tablet Take 1 tablet (5 mg total) by mouth daily as needed (30 minutes before drainage). 20 tablet 0   No facility-administered medications prior to visit.       Objective:   Physical Exam:  General appearance: 76 y.o., female, NAD, conversant  Eyes: anicteric sclerae; PERRL, tracking appropriately HENT: NCAT; MMM Neck: Trachea midline; no lymphadenopathy, no JVD Lungs: diminished left base, no crackles, no wheeze, with normal respiratory effort. PleurX incisions cdi. No overlying erythema. CV: RRR, no murmur  Abdomen: Soft, non-tender; non-distended, BS present  Extremities: No peripheral edema, warm Skin: Normal turgor and texture; no rash Psych: Appropriate affect Neuro: Alert and oriented to person and place, no focal deficit      Vitals:   09/21/21 1015  BP: 136/64  Pulse: 74  Temp: 97.8 F (36.6 C)  TempSrc: Oral  SpO2: 98%  Weight: 139 lb (63 kg)  Height: _0  (1.575 m)   98% on RA BMI Readings from Last 3 Encounters:  09/21/21 25.42 kg/m  09/14/21 25.48 kg/m  09/08/21 25.44 kg/m   Wt Readings from Last 3 Encounters:  09/21/21 139 lb (63 kg)  09/14/21 139 lb 4.8 oz (63.2 kg)  09/08/21 139 lb 1.8 oz (63.1 kg)     CBC    Component Value Date/Time   WBC 6.8 09/14/2021 1024   WBC 6.6 09/07/2021 0358  RBC 4.01 09/14/2021 1024   HGB 8.9 (L) 09/14/2021 1024   HGB 10.3 (L) 05/24/2021 1003   HCT 28.5 (L) 09/14/2021 1024   HCT 32.4 (L) 05/24/2021 1003   PLT 502 (H) 09/14/2021 1024   PLT 345 05/24/2021 1003   MCV 71.1 (L) 09/14/2021 1024   MCV 72 (L) 05/24/2021 1003   MCH 22.2 (L) 09/14/2021 1024   MCHC 31.2 09/14/2021 1024   RDW 14.8 09/14/2021 1024   RDW 14.4 05/24/2021 1003   LYMPHSABS 1.1 09/14/2021 1024   LYMPHSABS 1.6 05/24/2021 1003   MONOABS 0.5 09/14/2021 1024   EOSABS 0.1 09/14/2021 1024   EOSABS 0.1 05/24/2021 1003   BASOSABS 0.0 09/14/2021 1024   BASOSABS 0.0 05/24/2021 1003     Chest Imaging: None new  Pulmonary Functions Testing Results: No flowsheet data found.     Assessment & Plan:   # Recurrent left malignant pleural effusion s/p pleurX placement  Plan: - sutures removed today, healing well. New dressing placed. - Drain every other day up to 1L maximum or until develops chest tightness (start drainage 09/09/21). If drains >300cc then continue draining every other day. If drains <300cc then drain every 3 days. If drains <100cc then drain every 4 days. If drains <50cc on two occasions then call [facility] at [phone] or send my chart message so we can evaluate for possible removal or troubleshooting. - If you have fever, rash over PleurX, purulent drainage from tunnel site, and/or chest pain then call our clinic or send mychart message - see you  in 6 weeks or sooner if need be!     Maryjane Hurter, MD [facility] 09/21/2021 11:49 AM

## 2021-09-21 ENCOUNTER — Encounter: Payer: Self-pay | Admitting: Student

## 2021-09-21 ENCOUNTER — Ambulatory Visit (INDEPENDENT_AMBULATORY_CARE_PROVIDER_SITE_OTHER): Payer: Medicare HMO | Admitting: Student

## 2021-09-21 ENCOUNTER — Other Ambulatory Visit: Payer: Self-pay

## 2021-09-21 VITALS — BP 136/64 | HR 74 | Temp 97.8°F | Ht 62.0 in | Wt 139.0 lb

## 2021-09-21 DIAGNOSIS — J910 Malignant pleural effusion: Secondary | ICD-10-CM

## 2021-09-21 DIAGNOSIS — J90 Pleural effusion, not elsewhere classified: Secondary | ICD-10-CM | POA: Diagnosis not present

## 2021-09-21 NOTE — Patient Instructions (Signed)
-   Drain every other day up to 1L maximum or until develops chest tightness (start drainage 09/09/21). If drains >300cc then continue draining every other day. If drains <300cc then drain every 3 days. If drains <100cc then drain every 4 days. If drains <50cc on two occasions then call [facility] at [phone] or send my chart message so we can evaluate for possible removal or troubleshooting. - If you have fever, rash over PleurX, purulent drainage from tunnel site, and/or chest pain then call our clinic or send mychart message - see you in 6 weeks or sooner if need be!

## 2021-09-22 ENCOUNTER — Telehealth: Payer: Self-pay | Admitting: Student

## 2021-09-22 NOTE — Telephone Encounter (Signed)
Sent following mychart message:  Would try to drain it again tomorrow and Sunday. It could be that there's not much fluid left to drain but sometimes it can get clogged with fibrous tissue. If you're not able to drain much tomorrow or Sunday then let us know by either mychart message or call and we would plan to get an x-ray and see you in clinic. If she feels significantly short of breath and you're not able to drain any significant amount of fluid, concern would be that it's clogged or malpositioned or that there's something else causing her to be short of breath - if that's the case then would head to [facility] or [facility].

## 2021-09-22 NOTE — Telephone Encounter (Signed)
Called patient's daughter but she did not answer. Left message for her to call back.  

## 2021-09-22 NOTE — Telephone Encounter (Signed)
Called and spoke with patient's daughter [name]. She stated that she attempted to drain her mother's pleurx cath this morning but did not receive any fluid. She did not see any fluid in the line as well. When she last drained it, it was 2 days ago and she was able to pull 39mL.   She is aware of the instructions on the AVS but she wanted to know if it was normal for the fluid to stop so abruptly. She plans on trying to drain the catheter again on Sunday.   Patient is not in any discomfort. The site does not have any signs of infection.   Dr. Verlee Monte, can you please advise? Thanks!

## 2021-09-27 ENCOUNTER — Encounter: Payer: Self-pay | Admitting: Family Medicine

## 2021-09-27 ENCOUNTER — Ambulatory Visit: Payer: Medicare HMO | Attending: Family Medicine | Admitting: Family Medicine

## 2021-09-27 VITALS — BP 187/71 | HR 81 | Ht 62.0 in | Wt 142.2 lb

## 2021-09-27 DIAGNOSIS — Z8673 Personal history of transient ischemic attack (TIA), and cerebral infarction without residual deficits: Secondary | ICD-10-CM

## 2021-09-27 DIAGNOSIS — E1141 Type 2 diabetes mellitus with diabetic mononeuropathy: Secondary | ICD-10-CM

## 2021-09-27 DIAGNOSIS — D649 Anemia, unspecified: Secondary | ICD-10-CM

## 2021-09-27 DIAGNOSIS — E1159 Type 2 diabetes mellitus with other circulatory complications: Secondary | ICD-10-CM | POA: Diagnosis not present

## 2021-09-27 DIAGNOSIS — I152 Hypertension secondary to endocrine disorders: Secondary | ICD-10-CM | POA: Diagnosis not present

## 2021-09-27 DIAGNOSIS — C50919 Malignant neoplasm of unspecified site of unspecified female breast: Secondary | ICD-10-CM

## 2021-09-27 DIAGNOSIS — I672 Cerebral atherosclerosis: Secondary | ICD-10-CM

## 2021-09-27 MED ORDER — CARVEDILOL 25 MG PO TABS: 25.0000 mg | ORAL_TABLET | Freq: Two times a day (BID) | ORAL | 1 refills | Status: DC

## 2021-09-27 MED ORDER — ATORVASTATIN CALCIUM 80 MG PO TABS: ORAL_TABLET | ORAL | 1 refills | Status: DC

## 2021-09-27 MED ORDER — GABAPENTIN 100 MG PO CAPS: 100.0000 mg | ORAL_CAPSULE | Freq: Three times a day (TID) | ORAL | 1 refills | Status: DC

## 2021-09-27 MED ORDER — ALLOPURINOL 100 MG PO TABS: 100.0000 mg | ORAL_TABLET | Freq: Every day | ORAL | 1 refills | Status: DC

## 2021-09-27 MED ORDER — METFORMIN HCL 500 MG PO TABS: ORAL_TABLET | ORAL | 1 refills | Status: DC

## 2021-09-27 NOTE — Progress Notes (Signed)
Subjective:  Patient ID: Heather Rogers, female    DOB: 07/28/1946  Age: 76 y.o. MRN: [id number]  CC: Hospitalization Follow-up   HPI Heather Rogers is a 76 y.o. year old female with a history of type 2 diabetes mellitus (A1c 6.7), hypertension, gout, COVID-19 in 06/2020,, history of CVA, status post loop recorder placement in 2019, newly diagnosed metastatic L breast cancer. Hospitalized last month with shortness of breath and found to have left pleural effusion secondary to metastatic breast cancer.  Pleural fluid cytology was in keeping with metastatic adenocarcinoma of the breast.  She underwent chest tube placement for pleural effusion. CT chest revealed: IMPRESSION: 1. Large irregular mass within the LEFT breast, incompletely imaged but measuring at least 10 cm greatest dimension, almost certainly representing invasive breast cancer. 2. Numerous masses within the LEFT axilla, largest measuring 2.5 cm, almost certainly representing metastatic lymphadenopathy. 3. 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 Superimposed pneumonia not excluded if febrile. 4. At least 2 masses within the pre-vascular mediastinum, largest measuring 2.1 cm, likely additional metastatic lymphadenopathy. One or both could also represent direct extension of the LEFT breast mass given the destructive change/discontinuity of the overlying sternum. 5. Small LEFT pleural effusion. LEFT-sided chest tube in place. 6. Multiple RIGHT-sided pulmonary nodules, suspicious for additional metastatic disease. 7. Blastic and lytic lesions throughout the sternum, compatible with metastatic disease. 8. 2.1 cm hypodense lesion within the RIGHT thyroid lobe. Recommend thyroid US (ref: J Am Coll Radiol. 2015 Feb;12(2): 143-50).      Interval History: She presents today accompanied by her daughter, Heather Rogers.  Patient has her  Heather Rogers residing with her so she is not alone. States she feels good.  Daughter goes over and helps with meals and medications She does have a home health nurse who comes in once a week.  They deny any need for PCS services.  She had a follow-up with pulmonary last week and has dressing changes at the site of the chest tube every other day.  Her appetite is good and she has no fever or myalgias. Patient states she has had a breast mass in her left breast and it looked abnormal but she is a private person and did not want to disclose any concern to anyone including her family or her daughter.  Per daughter patient had gone for mammogram at the breast center during the pandemic but this was not done.  Visit with oncology was 2 weeks ago and notes reviewed.  She has an upcoming appointment for PET/CT.  She was placed on Letrozole and [name] is pending insurance approval. She has been constipated and is doing Miralax which has been helpful. Her blood pressure is significantly elevated but at her pulmonary visit he was controlled.  During her hospitalization amlodipine and valsartan/hctz were discontinued due to blood pressure being controlled. Her A1c is 6.4 and she is doing well on metformin Past Medical History:  Diagnosis Date   Diabetes ([place])    Hypertension    Pneumonia due to COVID-19 virus 07/25/2020   Stroke (cerebrum) [facility])    Stroke ([place]) 05/15/2018    Past Surgical History:  Procedure Laterality Date   CHEST TUBE INSERTION Left 09/07/2021   Procedure: CHEST TUBE INSERTION;  Surgeon: Candee Furbish, MD;  Location: [facility] ENDOSCOPY;  Service: Pulmonary;  Laterality: Left;   EYE SURGERY Left 04/06/2020   Implant placed   EYE SURGERY Right 03/30/2020   Implant placed  hysterectomy     LOOP RECORDER INSERTION N/A 05/20/2018   Procedure: LOOP RECORDER INSERTION;  Surgeon: Thompson Grayer, MD;  Location: [facility] LAB;  Service: Cardiovascular;  Laterality: N/A;   TEE WITHOUT  CARDIOVERSION N/A 05/20/2018   Procedure: TRANSESOPHAGEAL ECHOCARDIOGRAM (TEE);  Surgeon: Sueanne Margarita, MD;  Location[facility] END[facility]COPY;  Service: Cardiovascular;  Laterality: N/A;    Family History  Problem Relation Age of Onset   Hypertension Mother    Stroke Mother    Hypertension Father     Allergies  Allergen Reactions   Sulfa Antibiotics     Itching     Outpatient Medications Prior to Visit  Medication Sig Dispense Refill   Accu-Chek FastClix Lancets MISC Check blood sugar fasting and before meals and again if pt feels bad (symptoms of hypo). 100 each 12   aspirin EC 81 MG tablet Take 81 mg by mouth daily. Swallow whole.     Blood Glucose Monitoring Suppl (ACCU-CHEK GUIDE) w/Device KIT 1 each by Does not apply route as directed. 1 kit 0   cholecalciferol (VITAMIN D3) 25 MCG (1000 UNIT) tablet Take 1,000 Units by mouth daily.     feeding supplement (ENSURE ENLIVE / ENSURE PLUS) LIQD Take 237 mLs by mouth 2 (two) times daily between meals. 14220 mL 0   ferrous sulfate 325 (65 FE) MG EC tablet Take 1 tablet (325 mg total) by mouth in the morning and at bedtime. 60 tablet 1   glucose blood (ACCU-CHEK GUIDE) test strip USE TO CHECK FASTING BLOOD SUGAR AND BEFORE MEALS AND AGAIN IF PATIENT FEELS BAD; SYMPTOMS OF HYPOGLYCEMIA 100 strip 11   letrozole (FEMARA) 2.5 MG tablet Take 1 tablet (2.5 mg total) by mouth daily. 90 tablet 3   Metamucil Fiber CHEW Chew 1 tablet by mouth daily.     Multiple Vitamin (MULTIVITAMIN WITH MINERALS) TABS tablet Take 1 tablet by mouth daily. 30 tablet 0   allopurinol (ZYLOPRIM) 100 MG tablet TAKE ONE TABLET BY MOUTH DAILY (Patient taking differently: Take 100 mg by mouth daily.) 90 tablet 1   atorvastatin (LIPITOR) 80 MG tablet TAKE ONE TABLET BY MOUTH EVERY EVENING TO LOWER CHOLESTEROL (Patient taking differently: Take 80 mg by mouth every evening.) 90 tablet 1   carvedilol (COREG) 25 MG tablet Take 1 tablet (25 mg total) by mouth 2 (two) times daily with  a meal. 180 tablet 1   gabapentin (NEURONTIN) 100 MG capsule Take 1 capsule (100 mg total) by mouth 3 (three) times daily. 270 capsule 1   metFORMIN (GLUCOPHAGE) 500 MG tablet TAKE ONE TABLET BY MOUTH TWICE A DAY WITH A MEAL (Patient taking differently: Take 500 mg by mouth 2 (two) times daily with a meal.) 180 tablet 1   abemaciclib ([name]) 50 MG tablet Take 1 tablet (50 mg total) by mouth 2 (two) times daily. Swallow tablets whole. Do not chew, crush, or split tablets before swallowing. Start as soon as you get it (Patient not taking: Reported on 09/21/2021) 60 tablet 0   No facility-administered medications prior to visit.     ROS Review of Systems  Constitutional:  Negative for activity change, appetite change and fatigue.  HENT:  Negative for congestion, sinus pressure and sore throat.   Eyes:  Negative for visual disturbance.  Respiratory:  Negative for cough, chest tightness, shortness of breath and wheezing.   Cardiovascular:  Negative for chest pain and palpitations.  Gastrointestinal:  Negative for abdominal distention, abdominal pain and constipation.  Endocrine: Negative  for polydipsia.  Genitourinary:  Negative for dysuria and frequency.  Musculoskeletal:  Negative for arthralgias and back pain.  Skin:  Negative for rash.  Neurological:  Negative for tremors, light-headedness and numbness.  Hematological:  Does not bruise/bleed easily.  Psychiatric/Behavioral:  Negative for agitation and behavioral problems.    Objective:  BP (!) 187/71    Pulse 81    Ht _0  (1.575 m)    Wt 142 lb 3.2 oz (64.5 kg)    SpO2 100%    BMI 26.01 kg/m   BP/Weight 09/27/2021 09/21/2021 2/50/0370  Systolic BP 488 891 694  Diastolic BP 71 64 60  Wt. (Lbs) 142.2 139 139.3  BMI 26.01 25.42 25.48      Physical Exam Constitutional:      Appearance: She is well-developed.  Cardiovascular:     Rate and Rhythm: Normal rate.     Heart sounds: Normal heart sounds. No murmur heard. Pulmonary:      Effort: Pulmonary effort is normal.     Breath sounds: No wheezing or rales.     Comments: Normal breath sounds in the right, slightly decreased breath sounds on the left lower lung Chest:     Chest wall: No tenderness.  Breasts:    Right: No mass.     Left: Mass (large mass with lateral circular hyperpigmentation at 2 o'clock) present.  Abdominal:     General: Bowel sounds are normal. There is no distension.     Palpations: Abdomen is soft. There is no mass.     Tenderness: There is no abdominal tenderness.  Musculoskeletal:        General: Normal range of motion.     Right lower leg: No edema.     Left lower leg: No edema.  Neurological:     Mental Status: She is alert and oriented to person, place, and time.  Psychiatric:        Mood and Affect: Mood normal.    CMP Latest Ref Rng & Units 09/14/2021 09/07/2021 09/05/2021  Glucose 70 - 99 mg/dL 127(H) 152(H) 153(H)  BUN 8 - 23 mg/dL 18 38(H) 52(H)  Creatinine 0.44 - 1.00 mg/dL 0.93 1.20(H) 1.39(H)  Sodium 135 - 145 mmol/L 139 136 137  Potassium 3.5 - 5.1 mmol/L 4.7 4.2 4.0  Chloride 98 - 111 mmol/L 107 106 107  CO2 22 - 32 mmol/L 27 21(L) 20(L)  Calcium 8.9 - 10.3 mg/dL 10.2 8.8(L) 9.0  Total Protein 6.5 - 8.1 g/dL 7.2 - -  Total Bilirubin 0.3 - 1.2 mg/dL 0.2(L) - -  Alkaline Phos 38 - 126 U/L 70 - -  AST 15 - 41 U/L 38 - -  ALT 0 - 44 U/L 55(H) - -    Lipid Panel     Component Value Date/Time   CHOL 115 05/24/2021 1003   TRIG 105 05/24/2021 1003   HDL 38 (L) 05/24/2021 1003   CHOLHDL 3.0 05/24/2021 1003   CHOLHDL 4.7 05/16/2018 0420   VLDL 35 05/16/2018 0420   LDLCALC 57 05/24/2021 1003    CBC    Component Value Date/Time   WBC 6.8 09/14/2021 1024   WBC 6.6 09/07/2021 0358   RBC 4.01 09/14/2021 1024   HGB 8.9 (L) 09/14/2021 1024   HGB 10.3 (L) 05/24/2021 1003   HCT 28.5 (L) 09/14/2021 1024   HCT 32.4 (L) 05/24/2021 1003   PLT 502 (H) 09/14/2021 1024   PLT 345 05/24/2021 1003   MCV 71.1 (L) 09/14/2021 1024  MCV 72 (L) 05/24/2021 1003   MCH 22.2 (L) 09/14/2021 1024   MCHC 31.2 09/14/2021 1024   RDW 14.8 09/14/2021 1024   RDW 14.4 05/24/2021 1003   LYMPHSABS 1.1 09/14/2021 1024   LYMPHSABS 1.6 05/24/2021 1003   MONOABS 0.5 09/14/2021 1024   EOSABS 0.1 09/14/2021 1024   EOSABS 0.1 05/24/2021 1003   BASOSABS 0.0 09/14/2021 1024   BASOSABS 0.0 05/24/2021 1003    Lab Results  Component Value Date   HGBA1C 6.4 (H) 09/03/2021    Assessment & Plan:  1. Hypertension associated with diabetes ([place]) Uncontrolled with blood pressure of 187/71 but surprisingly blood pressure was 136/64 at a coronary visit last week I will have her daughter recheck her blood pressure at home tomorrow and send me a MyChart message with these values. Daughter states mom drank coffee this morning If blood pressure still above goal consider restarting amlodipine - carvedilol (COREG) 25 MG tablet; Take 1 tablet (25 mg total) by mouth 2 (two) times daily with a meal.  Dispense: 180 tablet; Refill: 1  2. Status post CVA Stable Status post loop recorder - atorvastatin (LIPITOR) 80 MG tablet; TAKE ONE TABLET BY MOUTH EVERY EVENING TO LOWER CHOLESTEROL  Dispense: 90 tablet; Refill: 1  3. Atherosclerotic cerebrovascular disease Currently on a statin - atorvastatin (LIPITOR) 80 MG tablet; TAKE ONE TABLET BY MOUTH EVERY EVENING TO LOWER CHOLESTEROL  Dispense: 90 tablet; Refill: 1  4. Metastatic breast cancer ([place]) Stage IV L breast cancer with metastasis Currently on Femara, [name] pending insurance company approval PET scan appointment scheduled in 2 days Have provided patient and her daughter with advanced directives and strongly encouraged him to complete this Declined need for PCS services at this time but advised to reach out should they change their mind Follow-up closely with oncology  5. Type 2 diabetes mellitus with diabetic mononeuropathy, without long-term current use of insulin (H[facility]) Controlled with A1c  of 6.4 - gabapentin (NEURONTIN) 100 MG capsule; Take 1 capsule (100 mg total) by mouth 3 (three) times daily.  Dispense: 270 capsule; Refill: 1 - metFORMIN (GLUCOPHAGE) 500 MG tablet; TAKE ONE TABLET BY MOUTH TWICE A DAY WITH A MEAL  Dispense: 180 tablet; Refill: 1 - Microalbumin / creatinine urine ratio  6. Anemia, unspecified type Discharge hemoglobin was 8.9 She is scheduled for repeat labs at the cancer center tomorrow hence I will hold off on repeating them today   Meds ordered this encounter  Medications   allopurinol (ZYLOPRIM) 100 MG tablet    Sig: Take 1 tablet (100 mg total) by mouth daily.    Dispense:  90 tablet    Refill:  1   atorvastatin (LIPITOR) 80 MG tablet    Sig: TAKE ONE TABLET BY MOUTH EVERY EVENING TO LOWER CHOLESTEROL    Dispense:  90 tablet    Refill:  1   carvedilol (COREG) 25 MG tablet    Sig: Take 1 tablet (25 mg total) by mouth 2 (two) times daily with a meal.    Dispense:  180 tablet    Refill:  1   gabapentin (NEURONTIN) 100 MG capsule    Sig: Take 1 capsule (100 mg total) by mouth 3 (three) times daily.    Dispense:  270 capsule    Refill:  1   metFORMIN (GLUCOPHAGE) 500 MG tablet    Sig: TAKE ONE TABLET BY MOUTH TWICE A DAY WITH A MEAL    Dispense:  180 tablet    Refill:  1  Follow-up: Return in about 1 week (around 10/04/2021) for Blood pressure follow-up; 3 months for chronic disease management.    49 minutes of total face to face time spent including median intraservice time reviewing previous notes and test results, counseling patient on diagnosis breast cancer, support systems in place in addition to management of chronic medical conditions.Time also spent ordering medications, investigations and documenting in the chart.  All questions were answered to the patient's satisfaction    Charlott Rakes, MD, FAAFP. [facility] [place], [place]   09/27/2021, 1:17 PM

## 2021-09-28 ENCOUNTER — Encounter: Payer: Self-pay | Admitting: Pharmacist

## 2021-09-28 ENCOUNTER — Other Ambulatory Visit: Payer: Self-pay

## 2021-09-28 ENCOUNTER — Inpatient Hospital Stay: Payer: Medicare HMO | Attending: Hematology and Oncology

## 2021-09-28 ENCOUNTER — Encounter: Payer: Self-pay | Admitting: Family Medicine

## 2021-09-28 ENCOUNTER — Inpatient Hospital Stay: Payer: Medicare HMO | Admitting: Pharmacist

## 2021-09-28 VITALS — BP 164/70 | HR 77 | Temp 98.1°F | Resp 18 | Ht 62.0 in | Wt 141.8 lb

## 2021-09-28 DIAGNOSIS — C50912 Malignant neoplasm of unspecified site of left female breast: Secondary | ICD-10-CM | POA: Diagnosis not present

## 2021-09-28 DIAGNOSIS — C50919 Malignant neoplasm of unspecified site of unspecified female breast: Secondary | ICD-10-CM

## 2021-09-28 DIAGNOSIS — Z170 Estrogen receptor positive status [ER+]: Secondary | ICD-10-CM | POA: Insufficient documentation

## 2021-09-28 LAB — CMP (CANCER CENTER ONLY)
ALT: 12 U/L (ref 0–44)
AST: 17 U/L (ref 15–41)
Albumin: 3.7 g/dL (ref 3.5–5.0)
Alkaline Phosphatase: 87 U/L (ref 38–126)
Anion gap: 5 (ref 5–15)
BUN: 14 mg/dL (ref 8–23)
CO2: 27 mmol/L (ref 22–32)
Calcium: 9.9 mg/dL (ref 8.9–10.3)
Chloride: 108 mmol/L (ref 98–111)
Creatinine: 0.95 mg/dL (ref 0.44–1.00)
GFR, Estimated: >60 mL/min (ref >60)
Glucose, Bld: 113 mg/dL — ABNORMAL HIGH (ref 70–99)
Potassium: 4.5 mmol/L (ref 3.5–5.1)
Sodium: 140 mmol/L (ref 135–145)
Total Bilirubin: 0.2 mg/dL — ABNORMAL LOW (ref 0.3–1.2)
Total Protein: 7.4 g/dL (ref 6.5–8.1)

## 2021-09-28 LAB — CBC WITH DIFFERENTIAL (CANCER CENTER ONLY)
Abs Immature Granulocytes: 0 10*3/uL (ref 0.00–0.07)
Basophils Absolute: 0 10*3/uL (ref 0.0–0.1)
Basophils Relative: 1 %
Eosinophils Absolute: 0.1 10*3/uL (ref 0.0–0.5)
Eosinophils Relative: 2 %
HCT: 32.5 % — ABNORMAL LOW (ref 36.0–46.0)
Hemoglobin: 9.8 g/dL — ABNORMAL LOW (ref 12.0–15.0)
Immature Granulocytes: 0 %
Lymphocytes Relative: 29 %
Lymphs Abs: 1 10*3/uL (ref 0.7–4.0)
MCH: 22.4 pg — ABNORMAL LOW (ref 26.0–34.0)
MCHC: 30.2 g/dL (ref 30.0–36.0)
MCV: 74.2 fL — ABNORMAL LOW (ref 80.0–100.0)
Monocytes Absolute: 0.4 10*3/uL (ref 0.1–1.0)
Monocytes Relative: 11 %
Neutro Abs: 2.1 10*3/uL (ref 1.7–7.7)
Neutrophils Relative %: 57 %
Platelet Count: 374 10*3/uL (ref 150–400)
RBC: 4.38 MIL/uL (ref 3.87–5.11)
RDW: 18.4 % — ABNORMAL HIGH (ref 11.5–15.5)
WBC Count: 3.6 10*3/uL — ABNORMAL LOW (ref 4.0–10.5)
nRBC: 0 % (ref 0.0–0.2)

## 2021-09-28 LAB — MICROALBUMIN / CREATININE URINE RATIO
Creatinine, Urine: 52.3 mg/dL
Microalb/Creat Ratio: 22 mg/g creat (ref 0–29)
Microalbumin, Urine: 11.7 ug/mL

## 2021-09-28 MED ORDER — ONDANSETRON HCL 8 MG PO TABS
8.0000 mg | ORAL_TABLET | Freq: Three times a day (TID) | ORAL | 2 refills | Status: DC | PRN
Start: 2021-09-28 — End: 2022-07-16

## 2021-09-28 MED ORDER — PROCHLORPERAZINE MALEATE 10 MG PO TABS
10.0000 mg | ORAL_TABLET | Freq: Four times a day (QID) | ORAL | 2 refills | Status: DC | PRN
Start: 2021-09-28 — End: 2023-03-11

## 2021-09-28 NOTE — Progress Notes (Signed)
[place]       Telephone: [phone]?Fax: [phone]   Oncology Clinical Pharmacist Practitioner Initial Assessment  Heather Rogers is a 76 y.o. female with a diagnosis of breast cancer. They were contacted today via in-person visit.  Indication/Regimen Abemaciclib ([name]) is being used appropriately for treatment of metastatic breast cancer by Dr. Nicholas Lose.      Wt Readings from Last 1 Encounters:  09/28/21 141 lb 12.8 oz (64.3 kg)    Estimated body surface area is 1.68 meters squared as calculated from the following:   Height as of this encounter: _0  (1.575 m).   Weight as of this encounter: 141 lb 12.8 oz (64.3 kg).  The dosing regimen is 50 mg by mouth every 12 hours on days 1 to 28 of a 28-day cycle. This is being given  in combination with letrozole . It is planned to continue until disease progression or unacceptable toxicity.  Heather Rogers is doing well.  She is accompanied by her daughter today.  She last saw Dr. Lindi Adie on February 16 and he has referred her to clinical pharmacy for management of her abemaciclib.  She has not started this medication yet, as she is waiting for [facility] to set up shipment.  [facility] phone number was given to Heather Rogers today and they will reach out to them to coordinate shipment.  So far, Heather Rogers is tolerating the letrozole well.  Dr. Lindi Adie has ordered a PET scan to review if she has any other metastases in the body.  She does have a PleurX catheter in place for her recurrent pleural effusions.  We went over potential side effects today of abemaciclib which include but are not limited to diarrhea, neutropenia, liver toxicity, pneumonitis, nausea, vomiting, and blood clots.  We reviewed how to monitor for each of these potential side effects and to contact the clinic should the symptoms arise or if there are any questions.  The clinic number was provided [phone].  We have sent prescriptions for  prochlorperazine and ondansetron should she have any nausea.  We went over how to take these medications.  We also instructed Heather Rogers to pick up some over-the-counter loperamide (Imodium) and we have put this in her medication list as a historical med.  Heather Rogers has also started back on her amlodipine 10 mg daily which is managed by her primary care physician Dr. Teressa Senter.  Heather Rogers will be speaking with Dr. Teressa Senter soon and clinical pharmacy will forward our visit note from today to Dr. Teressa Senter and Dr. Lindi Adie.  We instructed Heather Rogers and her daughter to either call the clinic or send a MyChart message once they have received the abemaciclib and started therapy.  We will then order labs in another clinical pharmacy visit 2 weeks after that start date per manufacturing guidelines we discussed with [name] and her daughter that manufacturing guidelines recommend labs every 2 weeks for 2 months, then monthly for 2 months and then as clinically indicated.  We discussed that if the PET scan shows the possibility of bone metastases, that Dr. Lindi Adie may want to start Heather Rogers on a bone strengthening agent such as denosumab 120 mg or zoledronic acid.   Dose Modifications 50 mg every 12 hour to be increased to full dose as tolerated monthly  Access Assessment [place] MOSK[place]ITZ will be receiving abemaciclib through  [employer] Concerns: none Start date if known: not started yet  Allergies Allergies  Allergen Reactions   Sulfa Antibiotics     Itching     Vitals Vitals with BMI 09/28/2021 09/27/2021 09/27/2021  Height _0  - _1   Weight 141 lbs 13 oz - 142 lbs 3 oz  BMI 40.98 - 26  Systolic 119 147 829  Diastolic 70 71 73  Pulse 77 - 81     Laboratory Data CBC EXTENDED Latest Ref Rng & Units 09/28/2021 09/14/2021 09/07/2021  WBC 4.0 - 10.5 K/uL 3.6(L) 6.8 6.6  RBC 3.87 - 5.11 MIL/uL 4.38 4.01 3.73(L)  HGB 12.0 - 15.0 g/dL 9.8(L) 8.9(L) 8.5(L)  HCT 36.0  - 46.0 % 32.5(L) 28.5(L) 26.4(L)  PLT 150 - 400 K/uL 374 502(H) 393  NEUTROABS 1.7 - 7.7 K/uL 2.1 5.0 3.9  LYMPHSABS 0.7 - 4.0 K/uL 1.0 1.1 1.4    CMP Latest Ref Rng & Units 09/28/2021 09/14/2021 09/07/2021  Glucose 70 - 99 mg/dL 113(H) 127(H) 152(H)  BUN 8 - 23 mg/dL 14 18 38(H)  Creatinine 0.44 - 1.00 mg/dL 0.95 0.93 1.20(H)  Sodium 135 - 145 mmol/L 140 139 136  Potassium 3.5 - 5.1 mmol/L 4.5 4.7 4.2  Chloride 98 - 111 mmol/L 108 107 106  CO2 22 - 32 mmol/L 27 27 21(L)  Calcium 8.9 - 10.3 mg/dL 9.9 10.2 8.8(L)  Total Protein 6.5 - 8.1 g/dL 7.4 7.2 -  Total Bilirubin 0.3 - 1.2 mg/dL 0.2(L) 0.2(L) -  Alkaline Phos 38 - 126 U/L 87 70 -  AST 15 - 41 U/L 17 38 -  ALT 0 - 44 U/L 12 55(H) -   Lab Results  Component Value Date   MG 2.5 (H) 09/07/2021   MG 2.6 (H) 09/03/2021   MG 2.5 (H) 07/27/2020     Contraindications Contraindications were reviewed? Yes Contraindications to therapy were identified? No   Safety Precautions The following safety precautions for the use of abemaciclib were reviewed:  Diarrhea: we reviewed that diarrhea is common with abemaciclib and confirmed that she does have loperamide (Imodium) at home.  We reviewed how to take this medication PRN and gave her information on abemaciclib Neutropenia: we discussed the importance of having a thermometer and what the Centers for Disease Control and Prevention (CDC) considers a fever which is 100.65F (38C) or higher.  Gave patient 24/7 triage line to call if any fevers or symptoms ILD/Pneumonitis: we reviewed potential symptoms including cough, shortness, and fatigue. Hepatotoxicity: reviewed to contact clinic for RUQ pain that will not subside, yellowing of eyes/skin Venous thromboembolism (VTE): reviewed signs of deep vein thrombosis (DVT) such as leg swelling, redness, pain, or tenderness and signs of pulmonary embolism (PE) such as shortness of breath, rapid or irregular heartbeat, cough, chest pain, or  lightheadedness Reviewed to take the medication every 12 hours (with food sometimes can be easier on the stomach) and to take it at the same time every day. Discussed proper storage and handling of abemaciclib  Medication Reconciliation Current Outpatient Medications  Medication Sig Dispense Refill   Accu-Chek FastClix Lancets MISC Check blood sugar fasting and before meals and again if pt feels bad (symptoms of hypo). 100 each 12   allopurinol (ZYLOPRIM) 100 MG tablet Take 1 tablet (100 mg total) by mouth daily. 90 tablet 1   amLODipine (NORVASC) 10 MG tablet Take 10 mg by mouth daily.     aspirin EC 81 MG tablet Take 81 mg by mouth daily. Swallow whole.     atorvastatin (LIPITOR) 80 MG tablet TAKE  ONE TABLET BY MOUTH EVERY EVENING TO LOWER CHOLESTEROL 90 tablet 1   Blood Glucose Monitoring Suppl (ACCU-CHEK GUIDE) w/Device KIT 1 each by Does not apply route as directed. 1 kit 0   carvedilol (COREG) 25 MG tablet Take 1 tablet (25 mg total) by mouth 2 (two) times daily with a meal. 180 tablet 1   cholecalciferol (VITAMIN D3) 25 MCG (1000 UNIT) tablet Take 1,000 Units by mouth daily.     feeding supplement (ENSURE ENLIVE / ENSURE PLUS) LIQD Take 237 mLs by mouth 2 (two) times daily between meals. 14220 mL 0   ferrous sulfate 325 (65 FE) MG EC tablet Take 1 tablet (325 mg total) by mouth in the morning and at bedtime. 60 tablet 1   gabapentin (NEURONTIN) 100 MG capsule Take 1 capsule (100 mg total) by mouth 3 (three) times daily. 270 capsule 1   glucose blood (ACCU-CHEK GUIDE) test strip USE TO CHECK FASTING BLOOD SUGAR AND BEFORE MEALS AND AGAIN IF PATIENT FEELS BAD; SYMPTOMS OF HYPOGLYCEMIA 100 strip 11   letrozole (FEMARA) 2.5 MG tablet Take 1 tablet (2.5 mg total) by mouth daily. 90 tablet 3   loperamide (IMODIUM) 2 MG capsule Take 2 mg by mouth as needed for diarrhea or loose stools. Take 2 tabs (4 mg) with first loose stool, then 1 tab (2 mg) for each additional loose stool. Do not take more  than 8 tabs (16 mg) in a 24 hour period.     Metamucil Fiber CHEW Chew 1 tablet by mouth daily.     metFORMIN (GLUCOPHAGE) 500 MG tablet TAKE ONE TABLET BY MOUTH TWICE A DAY WITH A MEAL 180 tablet 1   Multiple Vitamin (MULTIVITAMIN WITH MINERALS) TABS tablet Take 1 tablet by mouth daily. 30 tablet 0   ondansetron (ZOFRAN) 8 MG tablet Take 1 tablet (8 mg total) by mouth every 8 (eight) hours as needed for nausea or vomiting. 30 tablet 2   prochlorperazine (COMPAZINE) 10 MG tablet Take 1 tablet (10 mg total) by mouth every 6 (six) hours as needed for nausea or vomiting. 30 tablet 2   abemaciclib ([name]) 50 MG tablet Take 1 tablet (50 mg total) by mouth 2 (two) times daily. Swallow tablets whole. Do not chew, crush, or split tablets before swallowing. Start as soon as you get it (Patient not taking: Reported on 09/21/2021) 60 tablet 0   No current facility-administered medications for this visit.    Medication reconciliation is based on the patient's most recent medication list in the electronic medical record (EMR) including herbal products and OTC medications.   The patient's medication list was reviewed today with the patient? Yes   Drug-drug interactions (DDIs) DDIs were evaluated? Yes Significant DDIs identified? No   Drug-Food Interactions Drug-food interactions were evaluated? Yes Drug-food interactions identified?  Avoid grapefruit juice  Follow-up Plan  Prescriptions for ondansetron and prochlorperazine sent to her pharmacy of choice Ms. Barile to pick up loperamide over-the-counter to be used for abemaciclib induced diarrhea as needed Ms. Epstein will reach out to her primary care physician, Dr. Sherlean Foot, to discuss potentially starting back on valsartan/hydrochlorothiazide.  She restarted on amlodipine 10 mg daily recently Ms. Schwanz and her daughter will either send a MyChart message or contact the clinic once they have started abemaciclib.  Then labs in future visits will be  scheduled per manufacturing guidelines PET scan ordered by Dr. Lindi Adie.  If Dr. Lindi Adie feels appropriate, she may start on bone strengthening agents at some point in  the future  MAKIYA JEUNE participated in the discussion, expressed understanding, and voiced agreement with the above plan. All questions were answered to her satisfaction. The patient was advised to contact the clinic at [phone] with any questions or concerns prior to her return visit.   I spent 30 minutes assessing the patient.  Raina Mina, RPH-CPP, 09/28/2021 10:19 AM  **Disclaimer: This note was dictated with voice recognition software. Similar sounding words can inadvertently be transcribed and this note may contain transcription errors which may not have been corrected upon publication of note.**

## 2021-09-28 NOTE — Telephone Encounter (Signed)
Patient is approved for [name] at no cost from  [employer] 09/28/21-07/29/22. ? ?[facility] [employer] ? ?Wynn Maudlin CPHT ?Specialty Pharmacy Patient Advocate ?[place] ?Phone [phone] ?Fax [phone] ?09/28/2021 4:33 PM ? ?

## 2021-09-29 ENCOUNTER — Other Ambulatory Visit: Payer: Self-pay

## 2021-09-29 ENCOUNTER — Ambulatory Visit (HOSPITAL_COMMUNITY)
Admission: RE | Admit: 2021-09-29 | Discharge: 2021-09-29 | Disposition: A | Discharge status: Home / Self Care | Payer: Medicare HMO | Source: Ambulatory Visit | Attending: Hematology and Oncology | Admitting: Hematology and Oncology

## 2021-09-29 ENCOUNTER — Telehealth: Payer: Self-pay | Admitting: Pharmacist

## 2021-09-29 DIAGNOSIS — J90 Pleural effusion, not elsewhere classified: Secondary | ICD-10-CM | POA: Insufficient documentation

## 2021-09-29 DIAGNOSIS — E041 Nontoxic single thyroid nodule: Secondary | ICD-10-CM | POA: Insufficient documentation

## 2021-09-29 DIAGNOSIS — C50919 Malignant neoplasm of unspecified site of unspecified female breast: Secondary | ICD-10-CM | POA: Diagnosis not present

## 2021-09-29 DIAGNOSIS — C50912 Malignant neoplasm of unspecified site of left female breast: Secondary | ICD-10-CM | POA: Diagnosis not present

## 2021-09-29 DIAGNOSIS — C773 Secondary and unspecified malignant neoplasm of axilla and upper limb lymph nodes: Secondary | ICD-10-CM | POA: Diagnosis not present

## 2021-09-29 LAB — GLUCOSE, CAPILLARY: Glucose-Capillary: 93 mg/dL (ref 70–99)

## 2021-09-29 MED ORDER — FLUDEOXYGLUCOSE F - 18 (FDG) INJECTION
7.0600 | Freq: Once | INTRAVENOUS | Status: AC
  Administered 2021-09-29: 7.06 via INTRAVENOUS

## 2021-09-29 NOTE — Telephone Encounter (Signed)
Scheduled appointment per 03/02 los. Patient is aware.  ?

## 2021-10-04 ENCOUNTER — Ambulatory Visit: Payer: Medicare HMO | Attending: Family Medicine | Admitting: Family Medicine

## 2021-10-04 ENCOUNTER — Encounter: Payer: Self-pay | Admitting: Family Medicine

## 2021-10-04 ENCOUNTER — Other Ambulatory Visit: Payer: Self-pay

## 2021-10-04 VITALS — BP 154/88 | HR 73 | Ht 62.0 in | Wt 139.2 lb

## 2021-10-04 DIAGNOSIS — I152 Hypertension secondary to endocrine disorders: Secondary | ICD-10-CM | POA: Diagnosis not present

## 2021-10-04 DIAGNOSIS — E1159 Type 2 diabetes mellitus with other circulatory complications: Secondary | ICD-10-CM | POA: Diagnosis not present

## 2021-10-04 DIAGNOSIS — E1141 Type 2 diabetes mellitus with diabetic mononeuropathy: Secondary | ICD-10-CM | POA: Diagnosis not present

## 2021-10-04 LAB — GLUCOSE, POCT (MANUAL RESULT ENTRY): POC Glucose: 113 mg/dl — AB (ref 70–99)

## 2021-10-04 MED ORDER — HYDRALAZINE HCL 25 MG PO TABS: 25.0000 mg | ORAL_TABLET | Freq: Three times a day (TID) | ORAL | 3 refills | Status: DC

## 2021-10-04 NOTE — Patient Instructions (Signed)
DASH Eating Plan °DASH stands for Dietary Approaches to Stop Hypertension. The DASH eating plan is a healthy eating plan that has been shown to: °Reduce high blood pressure (hypertension). °Reduce your risk for type 2 diabetes, heart disease, and stroke. °Help with weight loss. °What are tips for following this plan? °Reading food labels °Check food labels for the amount of salt (sodium) per serving. Choose foods with less than 5 percent of the Daily Value of sodium. Generally, foods with less than 300 milligrams (mg) of sodium per serving fit into this eating plan. °To find whole grains, look for the word "whole" as the first word in the ingredient list. °Shopping °Buy products labeled as "low-sodium" or "no salt added." °Buy fresh foods. Avoid canned foods and pre-made or frozen meals. °Cooking °Avoid adding salt when cooking. Use salt-free seasonings or herbs instead of table salt or sea salt. Check with your health care provider or pharmacist before using salt substitutes. °Do not fry foods. Cook foods using healthy methods such as baking, boiling, grilling, roasting, and broiling instead. °Cook with heart-healthy oils, such as olive, canola, avocado, soybean, or sunflower oil. °Meal planning ° °Eat a balanced diet that includes: °4 or more servings of fruits and 4 or more servings of vegetables each day. Try to fill one-half of your plate with fruits and vegetables. °6-8 servings of whole grains each day. °Less than 6 oz (170 g) of lean meat, poultry, or fish each day. A 3-oz (85-g) serving of meat is about the same size as a deck of cards. One egg equals 1 oz (28 g). °2-3 servings of low-fat dairy each day. One serving is 1 cup (237 mL). °1 serving of nuts, seeds, or beans 5 times each week. °2-3 servings of heart-healthy fats. Healthy fats called omega-3 fatty acids are found in foods such as walnuts, flaxseeds, fortified milks, and eggs. These fats are also found in cold-water fish, such as sardines, salmon,  and mackerel. °Limit how much you eat of: °Canned or prepackaged foods. °Food that is high in trans fat, such as some fried foods. °Food that is high in saturated fat, such as fatty meat. °Desserts and other sweets, sugary drinks, and other foods with added sugar. °Full-fat dairy products. °Do not salt foods before eating. °Do not eat more than 4 egg yolks a week. °Try to eat at least 2 vegetarian meals a week. °Eat more home-cooked food and less restaurant, buffet, and fast food. °Lifestyle °When eating at a restaurant, ask that your food be prepared with less salt or no salt, if possible. °If you drink alcohol: °Limit how much you use to: °0-1 drink a day for women who are not pregnant. °0-2 drinks a day for men. °Be aware of how much alcohol is in your drink. In the [place] one drink equals one 12 oz bottle of beer (355 mL), one 5 oz glass of wine (148 mL), or one 1½ oz glass of hard liquor (44 mL). °General information °Avoid eating more than 2,300 mg of salt a day. If you have hypertension, you may need to reduce your sodium intake to 1,500 mg a day. °Work with your health care provider to maintain a healthy body weight or to lose weight. Ask what an ideal weight is for you. °Get at least 30 minutes of exercise that causes your heart to beat faster (aerobic exercise) most days of the week. Activities may include walking, swimming, or biking. °Work with your health care provider or dietitian to   adjust your eating plan to your individual calorie needs. °What foods should I eat? °Fruits °All fresh, dried, or frozen fruit. Canned fruit in natural juice (without added sugar). °Vegetables °Fresh or frozen vegetables (raw, steamed, roasted, or grilled). Low-sodium or reduced-sodium tomato and vegetable juice. Low-sodium or reduced-sodium tomato sauce and tomato paste. Low-sodium or reduced-sodium canned vegetables. °Grains °Whole-grain or whole-wheat bread. Whole-grain or whole-wheat pasta. Brown rice. Oatmeal. Quinoa.  Bulgur. Whole-grain and low-sodium cereals. Pita bread. Low-fat, low-sodium crackers. Whole-wheat flour tortillas. °Meats and other proteins °Skinless chicken or turkey. Ground chicken or turkey. Pork with fat trimmed off. Fish and seafood. Egg whites. Dried beans, peas, or lentils. Unsalted nuts, nut butters, and seeds. Unsalted canned beans. Lean cuts of beef with fat trimmed off. Low-sodium, lean precooked or cured meat, such as sausages or meat loaves. °Dairy °Low-fat (1%) or fat-free (skim) milk. Reduced-fat, low-fat, or fat-free cheeses. Nonfat, low-sodium ricotta or cottage cheese. Low-fat or nonfat yogurt. Low-fat, low-sodium cheese. °Fats and oils °Soft margarine without trans fats. Vegetable oil. Reduced-fat, low-fat, or light mayonnaise and salad dressings (reduced-sodium). Canola, safflower, olive, avocado, soybean, and sunflower oils. Avocado. °Seasonings and condiments °Herbs. Spices. Seasoning mixes without salt. °Other foods °Unsalted popcorn and pretzels. Fat-free sweets. °The items listed above may not be a complete list of foods and beverages you can eat. Contact a dietitian for more information. °What foods should I avoid? °Fruits °Canned fruit in a light or heavy syrup. Fried fruit. Fruit in cream or butter sauce. °Vegetables °Creamed or fried vegetables. Vegetables in a cheese sauce. Regular canned vegetables (not low-sodium or reduced-sodium). Regular canned tomato sauce and paste (not low-sodium or reduced-sodium). Regular tomato and vegetable juice (not low-sodium or reduced-sodium). Pickles. Olives. °Grains °Baked goods made with fat, such as croissants, muffins, or some breads. Dry pasta or rice meal packs. °Meats and other proteins °Fatty cuts of meat. Ribs. Fried meat. Bacon. Bologna, salami, and other precooked or cured meats, such as sausages or meat loaves. Fat from the back of a pig (fatback). Bratwurst. Salted nuts and seeds. Canned beans with added salt. Canned or smoked fish.  Whole eggs or egg yolks. Chicken or turkey with skin. °Dairy °Whole or 2% milk, cream, and half-and-half. Whole or full-fat cream cheese. Whole-fat or sweetened yogurt. Full-fat cheese. Nondairy creamers. Whipped toppings. Processed cheese and cheese spreads. °Fats and oils °Butter. Stick margarine. Lard. Shortening. Ghee. Bacon fat. Tropical oils, such as coconut, palm kernel, or palm oil. °Seasonings and condiments °Onion salt, garlic salt, seasoned salt, table salt, and sea salt. Worcestershire sauce. Tartar sauce. Barbecue sauce. Teriyaki sauce. Soy sauce, including reduced-sodium. Steak sauce. Canned and packaged gravies. Fish sauce. Oyster sauce. Cocktail sauce. Store-bought horseradish. Ketchup. Mustard. Meat flavorings and tenderizers. Bouillon cubes. Hot sauces. Pre-made or packaged marinades. Pre-made or packaged taco seasonings. Relishes. Regular salad dressings. °Other foods °Salted popcorn and pretzels. °The items listed above may not be a complete list of foods and beverages you should avoid. Contact a dietitian for more information. °Where to find more information °National Heart, Lung, and Blood Institute: www.nhlbi.nih.gov °American Heart Association: www.heart.org °Academy of Nutrition and Dietetics: www.eatright.org °National Kidney Foundation: www.kidney.org °Summary °The DASH eating plan is a healthy eating plan that has been shown to reduce high blood pressure (hypertension). It may also reduce your risk for type 2 diabetes, heart disease, and stroke. °When on the DASH eating plan, aim to eat more fresh fruits and vegetables, whole grains, lean proteins, low-fat dairy, and heart-healthy fats. °With the DASH   eating plan, you should limit salt (sodium) intake to 2,300 mg a day. If you have hypertension, you may need to reduce your sodium intake to 1,500 mg a day. °Work with your health care provider or dietitian to adjust your eating plan to your individual calorie needs. °This information is not  intended to replace advice given to you by your health care provider. Make sure you discuss any questions you have with your health care provider. °Document Revised: 06/19/2019 Document Reviewed: 06/19/2019 °Elsevier Patient Education © 2022 Elsevier Inc. ° °

## 2021-10-04 NOTE — Progress Notes (Signed)
Subjective:  Patient ID: Heather Rogers, female    DOB: 12/13/1945  Age: 76 y.o. MRN: [id number]  CC: Follow-up (Discuss blood pressure  reading ,)   HPI Heather Rogers is a 76 y.o. year old female with a history of type 2 diabetes mellitus (A1c 6.4), hypertension, gout, COVID-19 in 06/2020,, history of CVA, status post loop recorder placement in 2019, newly diagnosed metastatic L breast cancer. She was previously on amlodipine and losartan/HCTZ which was discontinued during her hospitalization and she had been only on carvedilol at her last office visit for hypertension.  Interval History: Daughter has sent me a MyChart message after her last visit stating blood pressures have been elevated and I advised her to start amlodipine. Upon review of her blood pressure log, Home BPs have been 142-152/63-70.  She is currently on amlodipine 10 mg and carvedilol 25 mg twice daily. Daughter informs me that the pharmacist with the oncology clinic had stated that a diuretic would cause her to be dehydrated due to the fact that she will be commencing treatment with [name].  The patient has no additional concerns today and reports doing well. Past Medical History:  Diagnosis Date   Diabetes ([place])    Hypertension    Pneumonia due to COVID-19 virus 07/25/2020   Stroke (cerebrum) [facility])    Stroke ([place]) 05/15/2018    Past Surgical History:  Procedure Laterality Date   CHEST TUBE INSERTION Left 09/07/2021   Procedure: CHEST TUBE INSERTION;  Surgeon: Candee Furbish, MD;  Location: [facility] ENDOSCOPY;  Service: Pulmonary;  Laterality: Left;   EYE SURGERY Left 04/06/2020   Implant placed   EYE SURGERY Right 03/30/2020   Implant placed    hysterectomy     LOOP RECORDER INSERTION N/A 05/20/2018   Procedure: LOOP RECORDER INSERTION;  Surgeon: Thompson Grayer, MD;  Location: [facility];  Service: Cardiovascular;  Laterality: N/A;   TEE WITHOUT CARDIOVERSION N/A 05/20/2018   Procedure:  TRANSESOPHAGEAL ECHOCARDIOGRAM (TEE);  Surgeon: Sueanne Margarita, MD;  Location: [facility] ENDOSCOPY;  Service: Cardiovascular;  Laterality: N/A;    Family History  Problem Relation Age of Onset   Hypertension Mother    Stroke Mother    Hypertension Father     Allergies  Allergen Reactions   Sulfa Antibiotics     Itching     Outpatient Medications Prior to Visit  Medication Sig Dispense Refill   abemaciclib ([name]) 50 MG tablet Take 1 tablet (50 mg total) by mouth 2 (two) times daily. Swallow tablets whole. Do not chew, crush, or split tablets before swallowing. Start as soon as you get it 60 tablet 0   Accu-Chek FastClix Lancets MISC Check blood sugar fasting and before meals and again if pt feels bad (symptoms of hypo). 100 each 12   allopurinol (ZYLOPRIM) 100 MG tablet Take 1 tablet (100 mg total) by mouth daily. 90 tablet 1   amLODipine (NORVASC) 10 MG tablet Take 10 mg by mouth daily.     aspirin EC 81 MG tablet Take 81 mg by mouth daily. Swallow whole.     atorvastatin (LIPITOR) 80 MG tablet TAKE ONE TABLET BY MOUTH EVERY EVENING TO LOWER CHOLESTEROL 90 tablet 1   Blood Glucose Monitoring Suppl (ACCU-CHEK GUIDE) w/Device KIT 1 each by Does not apply route as directed. 1 kit 0   carvedilol (COREG) 25 MG tablet Take 1 tablet (25 mg total) by mouth 2 (two) times daily with a meal. 180 tablet 1   cholecalciferol (  VITAMIN D3) 25 MCG (1000 UNIT) tablet Take 1,000 Units by mouth daily.     feeding supplement (ENSURE ENLIVE / ENSURE PLUS) LIQD Take 237 mLs by mouth 2 (two) times daily between meals. 14220 mL 0   ferrous sulfate 325 (65 FE) MG EC tablet Take 1 tablet (325 mg total) by mouth in the morning and at bedtime. 60 tablet 1   gabapentin (NEURONTIN) 100 MG capsule Take 1 capsule (100 mg total) by mouth 3 (three) times daily. 270 capsule 1   glucose blood (ACCU-CHEK GUIDE) test strip USE TO CHECK FASTING BLOOD SUGAR AND BEFORE MEALS AND AGAIN IF PATIENT FEELS BAD; SYMPTOMS OF  HYPOGLYCEMIA 100 strip 11   letrozole (FEMARA) 2.5 MG tablet Take 1 tablet (2.5 mg total) by mouth daily. 90 tablet 3   loperamide (IMODIUM) 2 MG capsule Take 2 mg by mouth as needed for diarrhea or loose stools. Take 2 tabs (4 mg) with first loose stool, then 1 tab (2 mg) for each additional loose stool. Do not take more than 8 tabs (16 mg) in a 24 hour period.     Metamucil Fiber CHEW Chew 1 tablet by mouth daily.     metFORMIN (GLUCOPHAGE) 500 MG tablet TAKE ONE TABLET BY MOUTH TWICE A DAY WITH A MEAL 180 tablet 1   Multiple Vitamin (MULTIVITAMIN WITH MINERALS) TABS tablet Take 1 tablet by mouth daily. 30 tablet 0   ondansetron (ZOFRAN) 8 MG tablet Take 1 tablet (8 mg total) by mouth every 8 (eight) hours as needed for nausea or vomiting. 30 tablet 2   prochlorperazine (COMPAZINE) 10 MG tablet Take 1 tablet (10 mg total) by mouth every 6 (six) hours as needed for nausea or vomiting. 30 tablet 2   No facility-administered medications prior to visit.     ROS Review of Systems  Constitutional:  Negative for activity change, appetite change and fatigue.  HENT:  Negative for congestion, sinus pressure and sore throat.   Eyes:  Negative for visual disturbance.  Respiratory:  Negative for cough, chest tightness, shortness of breath and wheezing.   Cardiovascular:  Negative for chest pain and palpitations.  Gastrointestinal:  Negative for abdominal distention, abdominal pain and constipation.  Endocrine: Negative for polydipsia.  Genitourinary:  Negative for dysuria and frequency.  Musculoskeletal:  Negative for arthralgias and back pain.  Skin:  Negative for rash.  Neurological:  Negative for tremors, light-headedness and numbness.  Hematological:  Does not bruise/bleed easily.  Psychiatric/Behavioral:  Negative for agitation and behavioral problems.    Objective:  BP (!) 154/88    Pulse 73    Ht 5' 2"  (1.575 m)    Wt 139 lb 3.2 oz (63.1 kg)    SpO2 100%    BMI 25.46 kg/m   BP/Weight  10/04/2021 11/05/1854 09/27/4968  Systolic BP 263 785 885  Diastolic BP 88 70 71  Wt. (Lbs) 139.2 141.8 142.2  BMI 25.46 25.94 26.01      Physical Exam Constitutional:      Appearance: She is well-developed.  Cardiovascular:     Rate and Rhythm: Normal rate.     Heart sounds: Normal heart sounds. No murmur heard. Pulmonary:     Effort: Pulmonary effort is normal.     Breath sounds: Normal breath sounds. No wheezing or rales.  Chest:     Chest wall: No tenderness.  Abdominal:     General: Bowel sounds are normal. There is no distension.     Palpations: Abdomen is soft.  There is no mass.     Tenderness: There is no abdominal tenderness.  Musculoskeletal:        General: Normal range of motion.     Right lower leg: No edema.     Left lower leg: No edema.  Neurological:     Mental Status: She is alert and oriented to person, place, and time.  Psychiatric:        Mood and Affect: Mood normal.    CMP Latest Ref Rng & Units 09/28/2021 09/14/2021 09/07/2021  Glucose 70 - 99 mg/dL 113(H) 127(H) 152(H)  BUN 8 - 23 mg/dL 14 18 38(H)  Creatinine 0.44 - 1.00 mg/dL 0.95 0.93 1.20(H)  Sodium 135 - 145 mmol/L 140 139 136  Potassium 3.5 - 5.1 mmol/L 4.5 4.7 4.2  Chloride 98 - 111 mmol/L 108 107 106  CO2 22 - 32 mmol/L 27 27 21(L)  Calcium 8.9 - 10.3 mg/dL 9.9 10.2 8.8(L)  Total Protein 6.5 - 8.1 g/dL 7.4 7.2 -  Total Bilirubin 0.3 - 1.2 mg/dL 0.2(L) 0.2(L) -  Alkaline Phos 38 - 126 U/L 87 70 -  AST 15 - 41 U/L 17 38 -  ALT 0 - 44 U/L 12 55(H) -    Lipid Panel     Component Value Date/Time   CHOL 115 05/24/2021 1003   TRIG 105 05/24/2021 1003   HDL 38 (L) 05/24/2021 1003   CHOLHDL 3.0 05/24/2021 1003   CHOLHDL 4.7 05/16/2018 0420   VLDL 35 05/16/2018 0420   LDLCALC 57 05/24/2021 1003    CBC    Component Value Date/Time   WBC 3.6 (L) 09/28/2021 0903   WBC 6.6 09/07/2021 0358   RBC 4.38 09/28/2021 0903   HGB 9.8 (L) 09/28/2021 0903   HGB 10.3 (L) 05/24/2021 1003   HCT 32.5 (L)  09/28/2021 0903   HCT 32.4 (L) 05/24/2021 1003   PLT 374 09/28/2021 0903   PLT 345 05/24/2021 1003   MCV 74.2 (L) 09/28/2021 0903   MCV 72 (L) 05/24/2021 1003   MCH 22.4 (L) 09/28/2021 0903   MCHC 30.2 09/28/2021 0903   RDW 18.4 (H) 09/28/2021 0903   RDW 14.4 05/24/2021 1003   LYMPHSABS 1.0 09/28/2021 0903   LYMPHSABS 1.6 05/24/2021 1003   MONOABS 0.4 09/28/2021 0903   EOSABS 0.1 09/28/2021 0903   EOSABS 0.1 05/24/2021 1003   BASOSABS 0.0 09/28/2021 0903   BASOSABS 0.0 05/24/2021 1003    Lab Results  Component Value Date   HGBA1C 6.4 (H) 09/03/2021    Assessment & Plan:  1. Type 2 diabetes mellitus with diabetic mononeuropathy, without long-term current use of insulin (HCC) Controlled with A1c of 6.4 Continue metformin Counseled on Diabetic diet, my plate method, 388 minutes of moderate intensity exercise/week Blood sugar logs with fasting goals of 80-120 mg/dl, random of less than 180 and in the event of sugars less than 60 mg/dl or greater than 400 mg/dl encouraged to notify the clinic. Advised on the need for annual eye exams, annual foot exams, Pneumonia vaccine. - Glucose (CBG)  2. Hypertension associated with diabetes ([place]) Uncontrolled We will add on hydralazine to regimen Continue Coreg and amlodipine Keep blood pressure log at home and advised to send home blood pressure logs to me via [place] on blood pressure goal of less than 130/80, low-sodium, DASH diet, medication compliance, 150 minutes of moderate intensity exercise per week. Discussed medication compliance, adverse effects. - hydrALAZINE (APRESOLINE) 25 MG tablet; Take 1 tablet (25 mg total)  by mouth 3 (three) times daily.  Dispense: 90 tablet; Refill: 3    Meds ordered this encounter  Medications   hydrALAZINE (APRESOLINE) 25 MG tablet    Sig: Take 1 tablet (25 mg total) by mouth 3 (three) times daily.    Dispense:  90 tablet    Refill:  3    Follow-up: Return in about 3 months (around  01/04/2022) for Chronic medical conditions.       Charlott Rakes, MD, FAAFP. [facility] and [place], [place]   10/04/2021, 1:10 PM

## 2021-10-06 NOTE — Telephone Encounter (Signed)
Oral Chemotherapy Pharmacist Encounter ? ?I spoke with patient for overview of: [name] for the treatment of metastatic, hormone-receptor positive breast cancer, in combination with letrozole, planned duration until disease progression or unacceptable toxicity.  ? ?Counseled patient on administration, dosing, side effects, monitoring, drug-food interactions, safe handling, storage, and disposal. ? ?Patient will take [name] '50mg'$  tablets, 1 tablet by mouth twice daily without regard to food. ? ?Patient knows to avoid grapefruit and grapefruit juice. ? ?Patient is taking letrozole once daily. ? ?[name] start date: 10/07/2021 ? ?Adverse effects include but are not limited to: diarrhea, fatigue, nausea, abdominal pain, decreased blood counts, and increased liver function tests, and joint pains. ?Severe, life-threatening, and/or fatal interstitial lung disease (ILD) and/or pneumonitis may occur with CDK 4/6 inhibitors. ? ?Patient has anti-emetic on hand and knows to take it if nausea develops.   ?Patient will obtain anti diarrheal and alert the office of 4 or more loose stools above baseline. ? ?Reviewed with patient importance of keeping a medication schedule and plan for any missed doses. No barriers to medication adherence identified. ? ?Medication reconciliation performed and medication/allergy list updated. ? ?Ship broker for [employer] has been obtained. ?Test claim at the pharmacy revealed copayment $0 for 1st fill of 30. ?Patient receives through [employer], commercial cares assistance program) ? ?Patient informed the pharmacy will reach out 5-7 days prior to needing next fill of [name] to coordinate continued medication acquisition to prevent break in therapy. ? ?All questions answered. ? ?Heather Rogers voiced understanding and appreciation.  ? ?Medication education handout placed in mail for patient. Patient knows to call the office with questions or concerns. Oral Chemotherapy Clinic phone number provided  to patient.  ? ?[name], PharmD ?Hematology/Oncology Clinical Pharmacist ?[facility] ?[phone] ?10/06/2021   10:53 AM ? ?

## 2021-10-09 ENCOUNTER — Ambulatory Visit: Payer: Medicare HMO | Admitting: Internal Medicine

## 2021-10-09 NOTE — Progress Notes (Unsigned)
Cardiology Office Note:    Date:  10/09/2021   ID:  Heather Rogers, DOB 09-27-1945, MRN [id number]  PCP:  Charlott Rakes, MD   [facility] Providers Cardiologist:  None { Click to update primary MD,subspecialty MD or APP then REFRESH:1}    Referring MD: Charlott Rakes, MD   CC: *** Consulted for the evaluation of HOCM at the behest of Charlott Rakes, MD  History of Present Illness:    Heather Rogers is a 76 y.o. female with a hx of HTN, HLD, prior stroke seen in the setting of potential HCM: LVOT gradient of 26 mm HG  with [place].  Patient notes that she is doing ***.   Since day prior/last visit notes *** . There are no*** interval hospital/ED visit.    No chest pain or pressure ***.  No SOB/DOE*** and no PND/Orthopnea***.  No weight gain or leg swelling***.  No palpitations or syncope ***.  Ambulatory blood pressure ***.   Past Medical History:  Diagnosis Date   Diabetes ([place])    Hypertension    Pneumonia due to COVID-19 virus 07/25/2020   Stroke (cerebrum) [facility])    Stroke ([place]) 05/15/2018    Past Surgical History:  Procedure Laterality Date   CHEST TUBE INSERTION Left 09/07/2021   Procedure: CHEST TUBE INSERTION;  Surgeon: Candee Furbish, MD;  Location: [facility] ENDOSCOPY;  Service: Pulmonary;  Laterality: Left;   EYE SURGERY Left 04/06/2020   Implant placed   EYE SURGERY Right 03/30/2020   Implant placed    hysterectomy     LOOP RECORDER INSERTION N/A 05/20/2018   Procedure: LOOP RECORDER INSERTION;  Surgeon: Thompson Grayer, MD;  Location: [facility];  Service: Cardiovascular;  Laterality: N/A;   TEE WITHOUT CARDIOVERSION N/A 05/20/2018   Procedure: TRANSESOPHAGEAL ECHOCARDIOGRAM (TEE);  Surgeon: Sueanne Margarita, MD;  Location: [facility] ENDOSCOPY;  Service: Cardiovascular;  Laterality: N/A;    Current Medications: No outpatient medications have been marked as taking for the 10/10/21 encounter (Appointment) with Werner Lean, MD.      Allergies:   Sulfa antibiotics   Social History   Socioeconomic History   Marital status: Divorced    Spouse name: Not on file   Number of children: Not on file   Years of education: Not on file   Highest education level: Not on file  Occupational History   Not on file  Tobacco Use   Smoking status: Former    Packs/day: 1.00    Years: 15.00    Pack years: 15.00    Types: Cigarettes    Quit date: 07/30/1985    Years since quitting: 36.2   Smokeless tobacco: Never  Substance and Sexual Activity   Alcohol use: Never   Drug use: Never   Sexual activity: Not Currently  Other Topics Concern   Not on file  Social History Narrative   Lives in [place].   Social Determinants of Health   Financial Resource Strain: Not on file  Food Insecurity: Not on file  Transportation Needs: Not on file  Physical Activity: Not on file  Stress: Not on file  Social Connections: Not on file     Family History: The patient's family history includes Hypertension in her father and mother; Stroke in her mother.  ROS:   Please see the history of present illness.     All other systems reviewed and are negative.  EKGs/Labs/Other Studies Reviewed:    The following studies were reviewed today:   EKG:  EKG is *** ordered today.  The ekg ordered today demonstrates *** 10/10/21:   Recent Labs: 09/02/2021: B Natriuretic Peptide 16.8; TSH 3.246 09/07/2021: Magnesium 2.5 09/28/2021: ALT 12; BUN 14; Creatinine 0.95; Hemoglobin 9.8; Platelet Count 374; Potassium 4.5; Sodium 140  Recent Lipid Panel    Component Value Date/Time   CHOL 115 05/24/2021 1003   TRIG 105 05/24/2021 1003   HDL 38 (L) 05/24/2021 1003   CHOLHDL 3.0 05/24/2021 1003   CHOLHDL 4.7 05/16/2018 0420   VLDL 35 05/16/2018 0420   LDLCALC 57 05/24/2021 1003          Physical Exam:    VS:  There were no vitals taken for this visit.    Wt Readings from Last 3 Encounters:  10/04/21 139 lb 3.2 oz (63.1 kg)  09/28/21 141 lb  12.8 oz (64.3 kg)  09/27/21 142 lb 3.2 oz (64.5 kg)     GEN: *** Well nourished, well developed in no acute distress HEENT: Normal NECK: No JVD; No carotid bruits LYMPHATICS: No lymphadenopathy CARDIAC: ***RRR, no murmurs, rubs, gallops RESPIRATORY:  Clear to auscultation without rales, wheezing or rhonchi  ABDOMEN: Soft, non-tender, non-distended MUSCULOSKELETAL:  No edema; No deformity  SKIN: Warm and dry NEUROLOGIC:  Alert and oriented x 3 PSYCHIATRIC:  Normal affect   ASSESSMENT:    No diagnosis found. PLAN:    Hypertrophic Cardiomyopathy - *** Variant; peak gradient *** on *** (< 50 mm Hg) - ***with/without MR, Apical Aneurysm - Family history ***, discussed 1st degree family history screening - NYHA *** - HCM SQ *** - Exercise testing normal for *** rhythm and *** peak gradient - Echo from *** notable for *** - CMR from *** notable for *** - *** presence/absence of atrial fibrillation - *** 2 year assessment for VT on rhythm monitor *** - *** CCB, BB,disopyramide; mavacamten consideration - *** Lasix if necessary - ICD/Discussions:  EF of ***, QRS duration of ***, *** syncope - [facility] *** [email or web address]  Will get CMR before getting aggressive      {Are you ordering a CV Procedure (e.g. stress test, cath, DCCV, TEE, etc)?   Press F2        :[id number]}    Medication Adjustments/Labs and Tests Ordered: Current medicines are reviewed at length with the patient today.  Concerns regarding medicines are outlined above.  No orders of the defined types were placed in this encounter.  No orders of the defined types were placed in this encounter.   There are no Patient Instructions on file for this visit.   Signed, Werner Lean, MD  10/09/2021 9:51 AM    [place]

## 2021-10-10 ENCOUNTER — Ambulatory Visit (INDEPENDENT_AMBULATORY_CARE_PROVIDER_SITE_OTHER): Payer: Medicare HMO | Admitting: Internal Medicine

## 2021-10-10 ENCOUNTER — Encounter: Payer: Self-pay | Admitting: Internal Medicine

## 2021-10-10 ENCOUNTER — Other Ambulatory Visit: Payer: Self-pay

## 2021-10-10 ENCOUNTER — Telehealth: Payer: Self-pay

## 2021-10-10 VITALS — BP 144/60 | HR 77 | Ht 62.0 in | Wt 139.8 lb

## 2021-10-10 DIAGNOSIS — C50919 Malignant neoplasm of unspecified site of unspecified female breast: Secondary | ICD-10-CM | POA: Diagnosis not present

## 2021-10-10 DIAGNOSIS — E1159 Type 2 diabetes mellitus with other circulatory complications: Secondary | ICD-10-CM | POA: Diagnosis not present

## 2021-10-10 DIAGNOSIS — I152 Hypertension secondary to endocrine disorders: Secondary | ICD-10-CM

## 2021-10-10 DIAGNOSIS — I422 Other hypertrophic cardiomyopathy: Secondary | ICD-10-CM

## 2021-10-10 NOTE — Patient Instructions (Signed)
Medication Instructions:  ?Your physician recommends that you continue on your current medications as directed. Please refer to the Current Medication list given to you today. ? ?*If you need a refill on your cardiac medications before your next appointment, please call your pharmacy* ? ? ?Lab Work: ?NONE ?If you have labs (blood work) drawn today and your tests are completely normal, you will receive your results only by: ?MyChart Message (if you have MyChart) OR ?A paper copy in the mail ?If you have any lab test that is abnormal or we need to change your treatment, we will call you to review the results. ? ? ?Testing/Procedures: ?Your physician has requested that you have a Cardiac MRI ? ? ?Follow-Up: ?At [facility], you and your health needs are our priority.  As part of our continuing mission to provide you with exceptional heart care, we have created designated Provider Care Teams.  These Care Teams include your primary Cardiologist (physician) and Advanced Practice Providers (APPs -  Physician Assistants and Nurse Practitioners) who all work together to provide you with the care you need, when you need it. ? ? ?Your next appointment:   ?6 month(s) ? ?The format for your next appointment:   ?In Person ? ?Provider:   ?Rudean Haskell, MD  ? ? ?Other Instructions ? ? ?You are scheduled for Cardiac MRI on ______________. Please arrive at the [facility] main entrance of [place] [facility] at ________________ (30-45 minutes prior to test start time). ? ? ?[facility] ?[address] ?[place], [place] [address] ?[phone] ? ?Please take advantage of the free valet parking available at the MAIN entrance (A entrance). Proceed to the [facility] Radiology Department (First Floor). ?? ?Magnetic resonance imaging (MRI) is a painless test that produces images of the inside of the body without using Xrays. ? ?During an MRI, strong magnets and radio waves work together in a Research officer, political party to form  detailed images.  ? ?MRI images may provide more details about a medical condition than X-rays, CT scans, and ultrasounds can provide. ? ?You may be given earphones to listen for instructions. ? ?You may eat a light breakfast and take medications as ordered with the exception of HCTZ (fluid pill, other). Please avoid stimulants for 12 hr prior to test. (Ie. Caffeine, nicotine, chocolate, or antihistamine medications) ? ?If a contrast material will be used, an IV will be inserted into one of your veins. Contrast material will be injected into your IV. It will leave your body through your urine within a day. You may be told to drink plenty of fluids to help flush the contrast material out of your system. ? ?You will be asked to remove all metal, including: Watch, jewelry, and other metal objects including hearing aids, hair pieces and dentures. Also wearable glucose monitoring systems (ie. Freestyle Libre and Omnipods) (Braces and fillings normally are not a problem.) ? ? ?TEST WILL TAKE APPROXIMATELY 1 HOUR ? ?PLEASE NOTIFY SCHEDULING AT LEAST 24 HOURS IN ADVANCE IF YOU ARE UNABLE TO KEEP YOUR APPOINTMENT. ?[phone] ? ?Please call Marchia Bond, cardiac imaging nurse navigator with any questions/concerns. ?Marchia Bond RN Navigator Cardiac Imaging ?Gordy Clement RN Navigator Cardiac Imaging ?[facility] ?[phone] Office    ?

## 2021-10-10 NOTE — Telephone Encounter (Signed)
Patient being seen in clinic by Dr. Gasper Sells. Daughter and patient has concerns that patient's loop recorder has not been monitored since 12/2020. Daughter states remote transmitter has not been working and was told last year another monitor would be delivered. No documentation of patient or daughter's call to clinic. Device implanted 05/20/2018 for cryptogenic stroke. Advised daughter that battery life for loop recorder is 3 years and device is no longer active for current monitoring. Daughter states patient still has remote appointments. Confirmed in epic. Apologized for miscommunication. All future appointment cancelled, patient marked inactive in paceart and discontinued in carelink. Daughter states medicare statement reflects monthly charge for loop. Encouraged daughter to bring in statement at her convenience for review as no monitoring has occurred since last remote 06/27/21. Of note patient has recently been diagnoses with stage 4 breast cancer in left breast. Advised daughter and patient to discuss removal of link with oncologist. Daughter thankful for information.  ?

## 2021-10-11 ENCOUNTER — Telehealth: Payer: Self-pay

## 2021-10-11 ENCOUNTER — Other Ambulatory Visit: Payer: Self-pay | Admitting: Hematology and Oncology

## 2021-10-11 NOTE — Telephone Encounter (Signed)
Copied from [place] [id number]. Topic: Quick Communication - Home Health Verbal Orders ?>> Sep 11, 2021  1:02 PM Tessa Lerner A wrote: ?Caller/Agency: [facility]  ?Callback Number: [phone] ?Requesting OT/PT/Skilled Nursing/Social Work/Speech Therapy: skilled nursing  ?Frequency: 1w9 ?Requesting OT/PT/Skilled Nursing/Social Work/Speech Therapy: home health aide  ?Frequency: 1w9 ? ? ?Verbal order were left on VM for patient. ?

## 2021-10-13 ENCOUNTER — Encounter: Payer: Self-pay | Admitting: Pharmacist

## 2021-10-16 ENCOUNTER — Telehealth: Payer: Self-pay | Admitting: Pharmacist

## 2021-10-16 NOTE — Telephone Encounter (Signed)
Scheduled appointment per inbasket message. Patient aware.  Patient can only come in after 10 and before 1.  ? ?

## 2021-10-19 ENCOUNTER — Inpatient Hospital Stay: Payer: Medicare HMO

## 2021-10-19 ENCOUNTER — Inpatient Hospital Stay: Payer: Medicare HMO | Admitting: Pharmacist

## 2021-10-31 ENCOUNTER — Inpatient Hospital Stay: Payer: Medicare HMO | Admitting: Pharmacist

## 2021-10-31 ENCOUNTER — Inpatient Hospital Stay: Payer: Medicare HMO | Attending: Hematology and Oncology

## 2021-10-31 ENCOUNTER — Other Ambulatory Visit: Payer: Self-pay

## 2021-10-31 VITALS — BP 161/62 | HR 75 | Temp 97.7°F | Resp 18 | Ht 62.0 in | Wt 139.2 lb

## 2021-10-31 DIAGNOSIS — C50912 Malignant neoplasm of unspecified site of left female breast: Secondary | ICD-10-CM | POA: Insufficient documentation

## 2021-10-31 DIAGNOSIS — C50919 Malignant neoplasm of unspecified site of unspecified female breast: Secondary | ICD-10-CM

## 2021-10-31 DIAGNOSIS — Z5111 Encounter for antineoplastic chemotherapy: Secondary | ICD-10-CM | POA: Insufficient documentation

## 2021-10-31 LAB — CBC WITH DIFFERENTIAL (CANCER CENTER ONLY)
Abs Immature Granulocytes: 0.01 10*3/uL (ref 0.00–0.07)
Basophils Absolute: 0 10*3/uL (ref 0.0–0.1)
Basophils Relative: 1 %
Eosinophils Absolute: 0 10*3/uL (ref 0.0–0.5)
Eosinophils Relative: 1 %
HCT: 30.3 % — ABNORMAL LOW (ref 36.0–46.0)
Hemoglobin: 9.7 g/dL — ABNORMAL LOW (ref 12.0–15.0)
Immature Granulocytes: 0 %
Lymphocytes Relative: 44 %
Lymphs Abs: 1.3 10*3/uL (ref 0.7–4.0)
MCH: 23 pg — ABNORMAL LOW (ref 26.0–34.0)
MCHC: 32 g/dL (ref 30.0–36.0)
MCV: 71.8 fL — ABNORMAL LOW (ref 80.0–100.0)
Monocytes Absolute: 0.3 10*3/uL (ref 0.1–1.0)
Monocytes Relative: 9 %
Neutro Abs: 1.3 10*3/uL — ABNORMAL LOW (ref 1.7–7.7)
Neutrophils Relative %: 45 %
Platelet Count: 166 10*3/uL (ref 150–400)
RBC: 4.22 MIL/uL (ref 3.87–5.11)
RDW: 17.5 % — ABNORMAL HIGH (ref 11.5–15.5)
WBC Count: 2.9 10*3/uL — ABNORMAL LOW (ref 4.0–10.5)
nRBC: 0 % (ref 0.0–0.2)

## 2021-10-31 LAB — CMP (CANCER CENTER ONLY)
ALT: 13 U/L (ref 0–44)
AST: 15 U/L (ref 15–41)
Albumin: 3.8 g/dL (ref 3.5–5.0)
Alkaline Phosphatase: 55 U/L (ref 38–126)
Anion gap: 4 — ABNORMAL LOW (ref 5–15)
BUN: 26 mg/dL — ABNORMAL HIGH (ref 8–23)
CO2: 28 mmol/L (ref 22–32)
Calcium: 10.1 mg/dL (ref 8.9–10.3)
Chloride: 108 mmol/L (ref 98–111)
Creatinine: 1.49 mg/dL — ABNORMAL HIGH (ref 0.44–1.00)
GFR, Estimated: 36 mL/min — ABNORMAL LOW (ref >60)
Glucose, Bld: 98 mg/dL (ref 70–99)
Potassium: 4.6 mmol/L (ref 3.5–5.1)
Sodium: 140 mmol/L (ref 135–145)
Total Bilirubin: 0.2 mg/dL — ABNORMAL LOW (ref 0.3–1.2)
Total Protein: 7 g/dL (ref 6.5–8.1)

## 2021-10-31 MED ORDER — ABEMACICLIB 50 MG PO TABS: 50.0000 mg | ORAL_TABLET | Freq: Two times a day (BID) | ORAL | 0 refills | Status: DC

## 2021-10-31 NOTE — Progress Notes (Signed)
[place]  ?     Telephone: [phone]?Fax: [phone]  ? ?Oncology Clinical Pharmacist Practitioner Progress Note ? ?Heather Rogers was contacted via in-person to discuss her chemotherapy regimen for abemaciclib which they receive under the care of Dr. Nicholas Lose. ? ?Current treatment regimen and start date ?Abemaciclib (10/09/21) ?Letrozole (09/14/21) ? ?Interval History ?She continues on abemaciclib 50 mg by mouth every 12 hours on days 1 to 28 of a 28-day cycle. This is being given  in combination with letrozole . Therapy is planned to continue until disease progression or unacceptable toxicity.  ? ?Response to Therapy ?Heather Rogers was seen today by clinical pharmacy as follow-up to her abemaciclib management.  She is accompanied by her Rogers Heather Rogers.  She reports starting the abemaciclib on March 13 and so far she reports tolerating it very well.  At this time, she is not reporting any side effects that would be attributed to abemaciclib such as nausea, vomiting, diarrhea, or fatigue.  Her absolute neutrophil count today is estimated at 1300 cells/?L.Marland Kitchen  At her last visit on March 2, prior to starting abemaciclib, her [place] was estimated at 2100 cells/?L.  Her serum creatinine has also increased with today's value being 1.49 mg/dL.  Her baseline value was estimated at 0.95 mg/dL.  We discussed potentially going up on the abemaciclib dose today, but due to these 2 lab values, we felt it may be best to do 1 more month of the 50 mg every 12 hour dose to make sure these values do not worsen.  Heather Rogers is in agreement with this plan.  Clinical pharmacy will send a new prescription to [facility] who ships this medication to her.  She believes she has about a week and a half left. ? ?Heather Rogers did have a PET scan that was done recently and they asked today if Dr. Lindi Adie would contact them to review the results.  Clinical pharmacy will forward our note from today's visit to Dr. Lindi Adie so that  he is aware.  We told Heather Rogers and her Rogers that likely this could just be a telephone call which they were agreeable to.  We also discussed that Dr. Lindi Adie may want to start her on a bone strengthener and if that is the case she will need a dental exam clearance prior to starting.  Heather Rogers reports that the Pleurx catheter is draining much less than it was initially which may be an indication of treatment response.  She reports prior to starting abemaciclib, she was draining approximately 425 mL every other day.  This is now down to 150 mL every 3 to 4 days.  Labs, vitals, treatment parameters, and manufacturer guidelines assessing toxicity were reviewed with Heather Rogers today. Based on these values, patient is in agreement to continue abemaciclib therapy at this time. ? ?Allergies ?Allergies  ?Allergen Reactions  ? Sulfa Antibiotics   ?  Itching ?  ? ? ?Vitals ? ?  10/31/2021  ? 11:03 AM 10/10/2021  ? 10:30 AM 10/04/2021  ?  9:43 AM  ?Vitals with BMI  ?Height 5' 2"  5' 2"  5' 2"   ?Weight 139 lbs 3 oz 139 lbs 13 oz 139 lbs 3 oz  ?BMI 25.45 25.56 25.45  ?Systolic 353 299 242  ?Diastolic 62 60 88  ?Pulse 75 77 73  ?  ? ?Laboratory Data ? ?  Latest Ref Rng & Units 10/31/2021  ? 10:22 AM 09/28/2021  ?  9:03  AM 09/14/2021  ? 10:24 AM  ?CBC EXTENDED  ?WBC 4.0 - 10.5 K/uL 2.9   3.6   6.8    ?RBC 3.87 - 5.11 MIL/uL 4.22   4.38   4.01    ?Hemoglobin 12.0 - 15.0 g/dL 9.7   9.8   8.9    ?HCT 36.0 - 46.0 % 30.3   32.5   28.5    ?Platelets 150 - 400 K/uL 166   374   502    ?NEUT# 1.7 - 7.7 K/uL 1.3   2.1   5.0    ?Lymph# 0.7 - 4.0 K/uL 1.3   1.0   1.1    ?  ? ?  Latest Ref Rng & Units 10/31/2021  ? 10:22 AM 09/28/2021  ?  9:03 AM 09/14/2021  ? 10:24 AM  ?CMP  ?Glucose 70 - 99 mg/dL 98   113   127    ?BUN 8 - 23 mg/dL 26   14   18     ?Creatinine 0.44 - 1.00 mg/dL 1.49   0.95   0.93    ?Sodium 135 - 145 mmol/L 140   140   139    ?Potassium 3.5 - 5.1 mmol/L 4.6   4.5   4.7    ?Chloride 98 - 111 mmol/L 108   108   107     ?CO2 22 - 32 mmol/L 28   27   27     ?Calcium 8.9 - 10.3 mg/dL 10.1   9.9   10.2    ?Total Protein 6.5 - 8.1 g/dL 7.0   7.4   7.2    ?Total Bilirubin 0.3 - 1.2 mg/dL 0.2   0.2   0.2    ?Alkaline Phos 38 - 126 U/L 55   87   70    ?AST 15 - 41 U/L 15   17   38    ?ALT 0 - 44 U/L 13   12   55    ?  ?Lab Results  ?Component Value Date  ? MG 2.5 (H) 09/07/2021  ? MG 2.6 (H) 09/03/2021  ? MG 2.5 (H) 07/27/2020  ? ? ?Adverse Effects Assessment ?Serum creatinine increased: today 1.49 mg/dL. ULN is 1 mg/dL. Will continue to monitor and hold per manufacturing guidelines if serum creatinine increases to Grade 3 toxicity or recurrent Grade 2 toxicity. Today Grade 1 ?Neutropenia: ANC estimated at 1300 cells/uL. Will hold for Grade 3 toxicity (< 1000 cells/uL) ? ?Adherence Assessment ?Heather Rogers reports missing 0 doses over the past 2 weeks. Takes abemaciclib at [place] and 7p.   ?Reason for missed dose: n/a ?Patient was re-educated on importance of adherence.  ? ?Access Assessment ?Heather Rogers is currently receiving her abemaciclib through  [facility]   ?Insurance concerns:  none ? ?Medication Reconciliation ?The patient's medication list was reviewed today with the patient? Yes ?New medications or herbal supplements have recently been started? Yes , restarted amlodipine per Dr. Margarita Rana (PCP) ?Any medications have been discontinued? No  ?The medication list was updated and reconciled based on the patient's most recent medication list in the electronic medical record (EMR) including herbal products and OTC medications.  ? ?Medications ?Current Outpatient Medications  ?Medication Sig Dispense Refill  ? abemaciclib ([name]) 50 MG tablet Take 1 tablet (50 mg total) by mouth 2 (two) times daily. Swallow tablets whole. Do not chew, crush, or split tablets before swallowing. Start as soon as you get it 60 tablet 0  ?  Accu-Chek FastClix Lancets MISC Check blood sugar fasting and before meals and again if pt  feels bad (symptoms of hypo). 100 each 12  ? allopurinol (ZYLOPRIM) 100 MG tablet Take 1 tablet (100 mg total) by mouth daily. 90 tablet 1  ? amLODipine (NORVASC) 10 MG tablet Take 10 mg by mouth daily.    ? aspirin EC 81 MG tablet Take 81 mg by mouth daily. Swallow whole.    ? atorvastatin (LIPITOR) 80 MG tablet TAKE ONE TABLET BY MOUTH EVERY EVENING TO LOWER CHOLESTEROL 90 tablet 1  ? Blood Glucose Monitoring Suppl (ACCU-CHEK GUIDE) w/Device KIT 1 each by Does not apply route as directed. 1 kit 0  ? carvedilol (COREG) 25 MG tablet Take 1 tablet (25 mg total) by mouth 2 (two) times daily with a meal. 180 tablet 1  ? cholecalciferol (VITAMIN D3) 25 MCG (1000 UNIT) tablet Take 1,000 Units by mouth daily.    ? ferrous sulfate 325 (65 FE) MG EC tablet Take 1 tablet (325 mg total) by mouth in the morning and at bedtime. 60 tablet 1  ? gabapentin (NEURONTIN) 100 MG capsule Take 1 capsule (100 mg total) by mouth 3 (three) times daily. 270 capsule 1  ? glucose blood (ACCU-CHEK GUIDE) test strip USE TO CHECK FASTING BLOOD SUGAR AND BEFORE MEALS AND AGAIN IF PATIENT FEELS BAD; SYMPTOMS OF HYPOGLYCEMIA 100 strip 11  ? hydrALAZINE (APRESOLINE) 25 MG tablet Take 1 tablet (25 mg total) by mouth 3 (three) times daily. 90 tablet 3  ? letrozole (FEMARA) 2.5 MG tablet TAKE ONE TABLET BY MOUTH DAILY 30 tablet 0  ? loperamide (IMODIUM) 2 MG capsule Take 2 mg by mouth as needed for diarrhea or loose stools. Take 2 tabs (4 mg) with first loose stool, then 1 tab (2 mg) for each additional loose stool. Do not take more than 8 tabs (16 mg) in a 24 hour period.    ? Metamucil Fiber CHEW Chew 1 tablet by mouth daily.    ? metFORMIN (GLUCOPHAGE) 500 MG tablet TAKE ONE TABLET BY MOUTH TWICE A DAY WITH A MEAL 180 tablet 1  ? Multiple Vitamin (MULTIVITAMIN WITH MINERALS) TABS tablet Take 1 tablet by mouth daily. 30 tablet 0  ? ondansetron (ZOFRAN) 8 MG tablet Take 1 tablet (8 mg total) by mouth every 8 (eight) hours as needed for nausea or  vomiting. 30 tablet 2  ? prochlorperazine (COMPAZINE) 10 MG tablet Take 1 tablet (10 mg total) by mouth every 6 (six) hours as needed for nausea or vomiting. 30 tablet 2  ? ?No current facility-administered

## 2021-11-01 ENCOUNTER — Telehealth: Payer: Self-pay | Admitting: Pharmacist

## 2021-11-01 NOTE — Telephone Encounter (Signed)
Scheduled appointment per 04/04 los. Left message.   ?

## 2021-11-02 ENCOUNTER — Telehealth: Payer: Self-pay | Admitting: Pharmacist

## 2021-11-02 ENCOUNTER — Other Ambulatory Visit: Payer: Self-pay | Admitting: Family Medicine

## 2021-11-02 DIAGNOSIS — I152 Hypertension secondary to endocrine disorders: Secondary | ICD-10-CM

## 2021-11-02 MED ORDER — HYDRALAZINE HCL 25 MG PO TABS: 25.0000 mg | ORAL_TABLET | Freq: Three times a day (TID) | ORAL | 3 refills | Status: DC

## 2021-11-02 NOTE — Telephone Encounter (Signed)
Silvia called from [facility] requesting a refill: ? ?Medication Refill - Medication: hydrALAZINE (APRESOLINE) 25 MG tablet  ? ?Has the patient contacted their pharmacy? Yes.   ?(Agent: If no, request that the patient contact the pharmacy for the refill. If patient does not wish to contact the pharmacy document the reason why and proceed with request.) ?(Agent: If yes, when and what did the pharmacy advise?) ? ?Preferred Pharmacy (with phone number or street name):  ?[address], [place]  ?[place] [address]  ?Phone: [phone] Fax: [phone]  ? ?Has the patient been seen for an appointment in the last year OR does the patient have an upcoming appointment? Yes.   ? ?Agent: Please be advised that RX refills may take up to 3 business days. We ask that you follow-up with your pharmacy. ? ?

## 2021-11-02 NOTE — Telephone Encounter (Signed)
Late entry-  ?Scientist, research (life sciences) and spoke with representative. Writer was informed that [facility] did not have refills on file.  ? ?Rx sent to [place].  ?

## 2021-11-05 NOTE — Progress Notes (Signed)
? ?Synopsis: Referred for recurrent malignant pleural effusion by Charlott Rakes, MD ? ?Subjective:  ? ?PATIENT ID: Heather Rogers GENDER: female DOB: 09-02-1945, MRN: [id number] ? ?Chief Complaint  ?Patient presents with  ? Follow-up  ?  Breathing is unchanged. She is draning every 3-4 days. 100 cc were removed the last 2 times. Last time she drained was 11/03/21.   ? ?75yF with history of metastatic breast cancer, left malignant pleural effusion s/p PleurX catheter placement 09/07/21.  ?  ?Draining anywhere from 100-500cc at a time, following drainage instructions, logging with PleurX app. No CP with drainage. No fever, rash.  ? ?Interval HPI: ? ?Last seen by me 09/20/21 at which point removed sutures and reviewed drainage technique.  ? ?Last drained it 4/7. Still draining 100cc. No tunnel site tenderness, redness, purulence. No orthopnea. ? ?Otherwise pertinent review of systems is negative. ? ?Past Medical History:  ?Diagnosis Date  ? Diabetes ([place])   ? Hypertension   ? Pneumonia due to COVID-19 virus 07/25/2020  ? Stroke (cerebrum) ([place])   ? Stroke [facility]) 05/15/2018  ?  ? ?Family History  ?Problem Relation Age of Onset  ? Hypertension Mother   ? Stroke Mother   ? Hypertension Father   ?  ? ?Past Surgical History:  ?Procedure Laterality Date  ? CHEST TUBE INSERTION Left 09/07/2021  ? Procedure: CHEST TUBE INSERTION;  Surgeon: Candee Furbish, MD;  Location: [facility] ENDOSCOPY;  Service: Pulmonary;  Laterality: Left;  ? EYE SURGERY Left 04/06/2020  ? Implant placed  ? EYE SURGERY Right 03/30/2020  ? Implant placed   ? hysterectomy    ? LOOP RECORDER INSERTION N/A 05/20/2018  ? Procedure: LOOP RECORDER INSERTION;  Surgeon: Thompson Grayer, MD;  Location: [place] CV LAB;  Service: Cardiovascular;  Laterality: N/A;  ? TEE WITHOUT CARDIOVERSION N/A 05/20/2018  ? Procedure: TRANSESOPHAGEAL ECHOCARDIOGRAM (TEE);  Surgeon: Sueanne Margarita, MD;  Location: [place];  Service: Cardiovascular;  Laterality: N/A;  ? ? ?Social  History  ? ?Socioeconomic History  ? Marital status: Divorced  ?  Spouse name: Not on file  ? Number of children: Not on file  ? Years of education: Not on file  ? Highest education level: Not on file  ?Occupational History  ? Not on file  ?Tobacco Use  ? Smoking status: Former  ?  Packs/day: 1.00  ?  Years: 15.00  ?  Pack years: 15.00  ?  Types: Cigarettes  ?  Quit date: 07/30/1985  ?  Years since quitting: 36.2  ? Smokeless tobacco: Never  ?Substance and Sexual Activity  ? Alcohol use: Never  ? Drug use: Never  ? Sexual activity: Not Currently  ?Other Topics Concern  ? Not on file  ?Social History Narrative  ? Lives in [place].  ? ?Social Determinants of Health  ? ?Financial Resource Strain: Not on file  ?Food Insecurity: Not on file  ?Transportation Needs: Not on file  ?Physical Activity: Not on file  ?Stress: Not on file  ?Social Connections: Not on file  ?Intimate Partner Violence: Not on file  ?  ? ?Allergies  ?Allergen Reactions  ? Sulfa Antibiotics   ?  Itching ?  ?  ? ?Outpatient Medications Prior to Visit  ?Medication Sig Dispense Refill  ? abemaciclib ([name]) 50 MG tablet Take 1 tablet (50 mg total) by mouth 2 (two) times daily. Swallow tablets whole. Do not chew, crush, or split tablets before swallowing. 56 tablet 0  ? Accu-Chek FastClix Lancets MISC  Check blood sugar fasting and before meals and again if pt feels bad (symptoms of hypo). 100 each 12  ? allopurinol (ZYLOPRIM) 100 MG tablet Take 1 tablet (100 mg total) by mouth daily. 90 tablet 1  ? amLODipine (NORVASC) 10 MG tablet Take 10 mg by mouth daily.    ? aspirin EC 81 MG tablet Take 81 mg by mouth daily. Swallow whole.    ? atorvastatin (LIPITOR) 80 MG tablet TAKE ONE TABLET BY MOUTH EVERY EVENING TO LOWER CHOLESTEROL 90 tablet 1  ? Blood Glucose Monitoring Suppl (ACCU-CHEK GUIDE) w/Device KIT 1 each by Does not apply route as directed. 1 kit 0  ? carvedilol (COREG) 25 MG tablet Take 1 tablet (25 mg total) by mouth 2 (two) times daily with a  meal. 180 tablet 1  ? cholecalciferol (VITAMIN D3) 25 MCG (1000 UNIT) tablet Take 1,000 Units by mouth daily.    ? ferrous sulfate 325 (65 FE) MG EC tablet Take 1 tablet (325 mg total) by mouth in the morning and at bedtime. 60 tablet 1  ? gabapentin (NEURONTIN) 100 MG capsule Take 1 capsule (100 mg total) by mouth 3 (three) times daily. 270 capsule 1  ? glucose blood (ACCU-CHEK GUIDE) test strip USE TO CHECK FASTING BLOOD SUGAR AND BEFORE MEALS AND AGAIN IF PATIENT FEELS BAD; SYMPTOMS OF HYPOGLYCEMIA 100 strip 11  ? hydrALAZINE (APRESOLINE) 25 MG tablet Take 1 tablet (25 mg total) by mouth 3 (three) times daily. 90 tablet 3  ? letrozole (FEMARA) 2.5 MG tablet TAKE ONE TABLET BY MOUTH DAILY 30 tablet 0  ? loperamide (IMODIUM) 2 MG capsule Take 2 mg by mouth as needed for diarrhea or loose stools. Take 2 tabs (4 mg) with first loose stool, then 1 tab (2 mg) for each additional loose stool. Do not take more than 8 tabs (16 mg) in a 24 hour period.    ? Metamucil Fiber CHEW Chew 1 tablet by mouth daily.    ? metFORMIN (GLUCOPHAGE) 500 MG tablet TAKE ONE TABLET BY MOUTH TWICE A DAY WITH A MEAL 180 tablet 1  ? Multiple Vitamin (MULTIVITAMIN WITH MINERALS) TABS tablet Take 1 tablet by mouth daily. 30 tablet 0  ? ondansetron (ZOFRAN) 8 MG tablet Take 1 tablet (8 mg total) by mouth every 8 (eight) hours as needed for nausea or vomiting. 30 tablet 2  ? prochlorperazine (COMPAZINE) 10 MG tablet Take 1 tablet (10 mg total) by mouth every 6 (six) hours as needed for nausea or vomiting. 30 tablet 2  ? ?No facility-administered medications prior to visit.  ? ? ? ? ? ?Objective:  ? ?Physical Exam: ? ?General appearance: 76 y.o., female, NAD, conversant  ?Eyes: anicteric sclerae; PERRL, tracking appropriately ?HENT: NCAT; MMM ?Neck: Trachea midline; no lymphadenopathy, no JVD ?Lungs: ctab, with normal respiratory effort. PleurX dressing cdi. Site nontender, no fluctuance. No surrounding erythema. ?CV: RRR, no murmur  ?Abdomen:  Soft, non-tender; non-distended, BS present  ?Extremities: No peripheral edema, warm ?Skin: Normal turgor and texture; no rash ?Psych: Appropriate affect ?Neuro: Alert and oriented to person and place, no focal deficit  ? ? ? ?Vitals:  ? 11/07/21 1038  ?BP: 128/64  ?Pulse: 74  ?Temp: 97.6 ?F (36.4 ?C)  ?TempSrc: Oral  ?SpO2: 96%  ?Weight: 140 lb (63.5 kg)  ?Height: 5' 2"  (1.575 m)  ? ? ?96% on RA ?BMI Readings from Last 3 Encounters:  ?11/07/21 25.61 kg/m?  ?10/31/21 25.46 kg/m?  ?10/10/21 25.57 kg/m?  ? ?Wt Readings  from Last 3 Encounters:  ?11/07/21 140 lb (63.5 kg)  ?10/31/21 139 lb 3.2 oz (63.1 kg)  ?10/10/21 139 lb 12.8 oz (63.4 kg)  ? ? ? ?CBC ?   ?Component Value Date/Time  ? WBC 2.9 (L) 10/31/2021 1022  ? WBC 6.6 09/07/2021 0358  ? RBC 4.22 10/31/2021 1022  ? HGB 9.7 (L) 10/31/2021 1022  ? HGB 10.3 (L) 05/24/2021 1003  ? HCT 30.3 (L) 10/31/2021 1022  ? HCT 32.4 (L) 05/24/2021 1003  ? PLT 166 10/31/2021 1022  ? PLT 345 05/24/2021 1003  ? MCV 71.8 (L) 10/31/2021 1022  ? MCV 72 (L) 05/24/2021 1003  ? MCH 23.0 (L) 10/31/2021 1022  ? MCHC 32.0 10/31/2021 1022  ? RDW 17.5 (H) 10/31/2021 1022  ? RDW 14.4 05/24/2021 1003  ? LYMPHSABS 1.3 10/31/2021 1022  ? LYMPHSABS 1.6 05/24/2021 1003  ? MONOABS 0.3 10/31/2021 1022  ? EOSABS 0.0 10/31/2021 1022  ? EOSABS 0.1 05/24/2021 1003  ? BASOSABS 0.0 10/31/2021 1022  ? BASOSABS 0.0 05/24/2021 1003  ? ? ? ?Chest Imaging: ?PET CT 10/01/21 reviewed by me remarkable for locally advanced left breast cancer, decreased size of left pleural effusion ? ?Pulmonary Functions Testing Results: ?   ? View : No data to display.  ?  ?  ?  ? ? ?   ?Assessment & Plan:  ? ?# Recurrent left malignant pleural effusion s/p pleurX placement: ?She has achieved pretty good pleural apposition, still draining 100-150cc each time. Optimistic we'll eventually be able to get it out.  ? ?Plan: ?- Continue usual drainage schedule. Drain every other day up to 1L maximum or until develops chest tightness (start  drainage 09/09/21). If drains >300cc then continue draining every other day. If drains <300cc then drain every 3 days. If drains <100cc then drain every 4 days. If drains <50cc on two occasions then call [facility]

## 2021-11-07 ENCOUNTER — Encounter: Payer: Self-pay | Admitting: Student

## 2021-11-07 ENCOUNTER — Ambulatory Visit (INDEPENDENT_AMBULATORY_CARE_PROVIDER_SITE_OTHER): Payer: Medicare HMO | Admitting: Student

## 2021-11-07 ENCOUNTER — Telehealth: Payer: Self-pay | Admitting: Pharmacist

## 2021-11-07 VITALS — BP 128/64 | HR 74 | Temp 97.6°F | Ht 62.0 in | Wt 140.0 lb

## 2021-11-07 DIAGNOSIS — C7981 Secondary malignant neoplasm of breast: Secondary | ICD-10-CM

## 2021-11-07 DIAGNOSIS — J910 Malignant pleural effusion: Secondary | ICD-10-CM | POA: Diagnosis not present

## 2021-11-07 NOTE — Telephone Encounter (Signed)
[place]  ?     Telephone: [phone]?Fax: [phone]  ? ?Oncology Clinical Pharmacist Practitioner Progress Note ? ?Heather Rogers is a 76 y.o. female with a diagnosis of metastatic breast cancer currently on abemaciclib and letrozole under the care of Dr. Nicholas Lose.  ? ?Clinical pharmacy received a message from Dr. Verlee Monte today.  Heather Rogers and her daughter Heather Rogers were inquiring about the abemaciclib prescription that had been sent at her last visit on April 4.  This prescription was sent to [facility] as [name] uses [facility] for her abemaciclib.  We contacted [place] via telephone and confirmed that she makes healthcare decisions for her mom as notated in the EMR. We discussed that we did send the prescription for abemaciclib to [facility] at her last visit April 4 as planned. Receipt of that prescription was received by [facility] at 1159 am on 10/31/21.  We were unaware that [facility] had not reached out to the patient to schedule shipment. Heather Rogers stated that she would contact [facility] and clinical pharmacy gave her the numbers to call.  She also has the numbers for [facility] if needed. We apologized for this delay. ? ?[name] has a follow-up appointment with clinical pharmacy scheduled for next Monday (11/13/21).  [name] and her daughter know to contact Dr. Geralyn Flash clinic should any symptoms, questions, or concerns arise in the interim. ? ?Clinical pharmacy will continue to support [name] and Dr. Nicholas Lose as needed. ? ?Raina Mina, RPH-CPP,  ?11/07/2021  11:33 AM  ? ?**Disclaimer: This note was dictated with voice recognition software. Similar sounding words can inadvertently be transcribed and this note may contain transcription errors which may not have been corrected upon publication of note.** ? ?

## 2021-11-07 NOTE — Patient Instructions (Addendum)
-   Drain every other day up to 1L maximum or until develops chest tightness (start drainage 09/09/21). If drains >300cc then continue draining every other day. If drains <300cc then drain every 3 days. If drains <100cc then drain every 4 days. If drains <50cc on two occasions then call [facility] at [phone] or send my chart message so we can evaluate for possible removal or troubleshooting. ?- If you have fever, rash over PleurX, purulent drainage from tunnel site, and/or chest pain then call our clinic or send mychart message and we'll see you in clinic ? ?

## 2021-11-08 ENCOUNTER — Other Ambulatory Visit: Payer: Self-pay | Admitting: Family Medicine

## 2021-11-13 ENCOUNTER — Inpatient Hospital Stay: Payer: Medicare HMO | Admitting: Pharmacist

## 2021-11-13 ENCOUNTER — Other Ambulatory Visit: Payer: Self-pay

## 2021-11-13 ENCOUNTER — Inpatient Hospital Stay: Payer: Medicare HMO

## 2021-11-13 VITALS — BP 156/64 | HR 72 | Temp 97.7°F | Resp 18 | Ht 62.0 in | Wt 140.8 lb

## 2021-11-13 DIAGNOSIS — C50912 Malignant neoplasm of unspecified site of left female breast: Secondary | ICD-10-CM | POA: Diagnosis not present

## 2021-11-13 DIAGNOSIS — C50919 Malignant neoplasm of unspecified site of unspecified female breast: Secondary | ICD-10-CM

## 2021-11-13 DIAGNOSIS — Z5111 Encounter for antineoplastic chemotherapy: Secondary | ICD-10-CM | POA: Diagnosis not present

## 2021-11-13 LAB — CMP (CANCER CENTER ONLY)
ALT: 17 U/L (ref 0–44)
AST: 19 U/L (ref 15–41)
Albumin: 4 g/dL (ref 3.5–5.0)
Alkaline Phosphatase: 58 U/L (ref 38–126)
Anion gap: 4 — ABNORMAL LOW (ref 5–15)
BUN: 24 mg/dL — ABNORMAL HIGH (ref 8–23)
CO2: 29 mmol/L (ref 22–32)
Calcium: 10.3 mg/dL (ref 8.9–10.3)
Chloride: 107 mmol/L (ref 98–111)
Creatinine: 1.45 mg/dL — ABNORMAL HIGH (ref 0.44–1.00)
GFR, Estimated: 37 mL/min — ABNORMAL LOW (ref >60)
Glucose, Bld: 118 mg/dL — ABNORMAL HIGH (ref 70–99)
Potassium: 4.1 mmol/L (ref 3.5–5.1)
Sodium: 140 mmol/L (ref 135–145)
Total Bilirubin: 0.3 mg/dL (ref 0.3–1.2)
Total Protein: 7.5 g/dL (ref 6.5–8.1)

## 2021-11-13 LAB — CBC WITH DIFFERENTIAL (CANCER CENTER ONLY)
Abs Immature Granulocytes: 0.01 10*3/uL (ref 0.00–0.07)
Basophils Absolute: 0 10*3/uL (ref 0.0–0.1)
Basophils Relative: 1 %
Eosinophils Absolute: 0.1 10*3/uL (ref 0.0–0.5)
Eosinophils Relative: 3 %
HCT: 31.4 % — ABNORMAL LOW (ref 36.0–46.0)
Hemoglobin: 10 g/dL — ABNORMAL LOW (ref 12.0–15.0)
Immature Granulocytes: 0 %
Lymphocytes Relative: 41 %
Lymphs Abs: 1.2 10*3/uL (ref 0.7–4.0)
MCH: 23.1 pg — ABNORMAL LOW (ref 26.0–34.0)
MCHC: 31.8 g/dL (ref 30.0–36.0)
MCV: 72.7 fL — ABNORMAL LOW (ref 80.0–100.0)
Monocytes Absolute: 0.4 10*3/uL (ref 0.1–1.0)
Monocytes Relative: 12 %
Neutro Abs: 1.2 10*3/uL — ABNORMAL LOW (ref 1.7–7.7)
Neutrophils Relative %: 43 %
Platelet Count: 258 10*3/uL (ref 150–400)
RBC: 4.32 MIL/uL (ref 3.87–5.11)
RDW: 18.5 % — ABNORMAL HIGH (ref 11.5–15.5)
WBC Count: 2.9 10*3/uL — ABNORMAL LOW (ref 4.0–10.5)
nRBC: 0 % (ref 0.0–0.2)

## 2021-11-13 NOTE — Progress Notes (Signed)
[place]  ?     Telephone: [phone]?Fax: [phone]  ? ?Oncology Clinical Pharmacist Practitioner Progress Note ? ?[name] was contacted via in-person to discuss her chemotherapy regimen for abemaciclib which they receive under the care of Dr. Nicholas Lose. ? ?Current treatment regimen and start date ?Abemaciclib (10/08/21) ?Letrozole (09/14/21) ? ?Interval History ?She continues on abemaciclib 50 mg by mouth every 12 hours on days 1 to 28 of a 28-day cycle. This is being given  in combination with letrozole . Therapy is planned to continue until disease progression or unacceptable toxicity.  ? ?Response to Therapy ?Ms. Trevathan was seen today by clinical pharmacy for follow-up on her abemaciclib prescription.  She is accompanied by her daughter [name].  Ms. Fiske says that she is doing well and not having any side effects that would be attributed to the abemaciclib.  Unfortunately our last prescription that was sent to [facility] on 10/31/2021 with a start date of 11/06/2021 was not received by Ms. Locust until 11/09/21.  She had taken her last dose on 11/06/21 so she had about 3 days where she did not receive abemaciclib.  [name] and Ms. Giuffre also states that [facility] only sent 3 weeks of medication this time, even though the prescription was for a 28-day supply.  [name] states that she will contact [facility] to investigate the discrepancy. ? ?We discussed potentially going up on the dose of abemaciclib today but after careful consideration [name] and Ms. Nuccio would like to continue on the current dose and reconsider going up on abemaciclib at the next visit in 2 weeks.  We felt this was reasonable considering her serum creatinine was slightly elevated today but stable and her ANC is approximately 1200 cells/?L.  Dr. Lindi Adie was also kind enough to discuss Ms. Beer's last scans today and he does not feel that additional thyroid work-up is needed at this time.  We also discussed  potentially going on a bone strengthener, but due to Ms. Bonaventura's many dental issues, Dr. Lindi Adie feels that this would not be necessary as the bone mets is not located currently on any bone that is weightbearing.  Ms. Heyward and daughter were in agreement with this plan.. Labs, vitals, treatment parameters, and manufacturer guidelines assessing toxicity were reviewed with [name] today. Based on these values, patient is in agreement to continue abemaciclib therapy at this time. ? ?Allergies ?Allergies  ?Allergen Reactions  ? Sulfa Antibiotics   ?  Itching ?  ? ? ?Vitals ? ?  11/13/2021  ? 10:11 AM 11/07/2021  ? 10:38 AM 10/31/2021  ? 11:03 AM  ?Vitals with BMI  ?Height 5' 2"  5' 2"  5' 2"   ?Weight 140 lbs 13 oz 140 lbs 139 lbs 3 oz  ?BMI 25.75 25.6 25.45  ?Systolic 881 103 159  ?Diastolic 64 64 62  ?Pulse 72 74 75  ?  ? ?Laboratory Data ? ?  Latest Ref Rng & Units 11/13/2021  ? 10:03 AM 10/31/2021  ? 10:22 AM 09/28/2021  ?  9:03 AM  ?CBC EXTENDED  ?WBC 4.0 - 10.5 K/uL 2.9   2.9   3.6    ?RBC 3.87 - 5.11 MIL/uL 4.32   4.22   4.38    ?Hemoglobin 12.0 - 15.0 g/dL 10.0   9.7   9.8    ?HCT 36.0 - 46.0 % 31.4   30.3   32.5    ?Platelets 150 - 400 K/uL 258   166  374    ?NEUT# 1.7 - 7.7 K/uL 1.2   1.3   2.1    ?Lymph# 0.7 - 4.0 K/uL 1.2   1.3   1.0    ?  ? ?  Latest Ref Rng & Units 11/13/2021  ? 10:03 AM 10/31/2021  ? 10:22 AM 09/28/2021  ?  9:03 AM  ?CMP  ?Glucose 70 - 99 mg/dL 118   98   113    ?BUN 8 - 23 mg/dL 24   26   14     ?Creatinine 0.44 - 1.00 mg/dL 1.45   1.49   0.95    ?Sodium 135 - 145 mmol/L 140   140   140    ?Potassium 3.5 - 5.1 mmol/L 4.1   4.6   4.5    ?Chloride 98 - 111 mmol/L 107   108   108    ?CO2 22 - 32 mmol/L 29   28   27     ?Calcium 8.9 - 10.3 mg/dL 10.3   10.1   9.9    ?Total Protein 6.5 - 8.1 g/dL 7.5   7.0   7.4    ?Total Bilirubin 0.3 - 1.2 mg/dL 0.3   0.2   0.2    ?Alkaline Phos 38 - 126 U/L 58   55   87    ?AST 15 - 41 U/L 19   15   17     ?ALT 0 - 44 U/L 17   13   12     ?  ?Lab Results   ?Component Value Date  ? MG 2.5 (H) 09/07/2021  ? MG 2.6 (H) 09/03/2021  ? MG 2.5 (H) 07/27/2020  ? ? ?Adverse Effects Assessment ?No adverse effects reported at today's visit ? ?Adherence Assessment ?[name] reports missing 0 doses over the past 2 weeks due to adherence. Did have 3 day lapse due to shipping.   ?Reason for missed dose: shipping issues with [facility] ?Patient was re-educated on importance of adherence.  ? ?Access Assessment ?ANUHEA GASSNER is currently receiving her abemaciclib through  [facility]   ?Insurance concerns:  none ? ?Medication Reconciliation ?The patient's medication list was reviewed today with the patient? Yes ?New medications or herbal supplements have recently been started? No  ?Any medications have been discontinued? No  ?The medication list was updated and reconciled based on the patient's most recent medication list in the electronic medical record (EMR) including herbal products and OTC medications.  ? ?Medications ?Current Outpatient Medications  ?Medication Sig Dispense Refill  ? abemaciclib ([name]) 50 MG tablet Take 1 tablet (50 mg total) by mouth 2 (two) times daily. Swallow tablets whole. Do not chew, crush, or split tablets before swallowing. 56 tablet 0  ? Accu-Chek FastClix Lancets MISC Check blood sugar fasting and before meals and again if pt feels bad (symptoms of hypo). 100 each 12  ? allopurinol (ZYLOPRIM) 100 MG tablet Take 1 tablet (100 mg total) by mouth daily. 90 tablet 1  ? amLODipine (NORVASC) 10 MG tablet Take 10 mg by mouth daily.    ? aspirin EC 81 MG tablet Take 81 mg by mouth daily. Swallow whole.    ? atorvastatin (LIPITOR) 80 MG tablet TAKE ONE TABLET BY MOUTH EVERY EVENING TO LOWER CHOLESTEROL 90 tablet 1  ? Blood Glucose Monitoring Suppl (ACCU-CHEK GUIDE) w/Device KIT 1 each by Does not apply route as directed. 1 kit 0  ? carvedilol (COREG) 25 MG tablet Take 1 tablet (25 mg total)  by mouth 2 (two) times daily with a meal. 180 tablet 1  ?  cholecalciferol (VITAMIN D3) 25 MCG (1000 UNIT) tablet Take 1,000 Units by mouth daily.    ? ferrous sulfate 325 (65 FE) MG EC tablet Take 1 tablet (325 mg total) by mouth in the morning and at bedtime. 60 tablet 1  ? gabapentin (NEURONTIN) 100 MG capsule Take 1 capsule (100 mg total) by mouth 3 (three) times daily. 270 capsule 1  ? glucose blood (ACCU-CHEK GUIDE) test strip USE TO CHECK FASTING BLOOD SUGAR AND BEFORE MEALS AND AGAIN IF PATIENT FEELS BAD; SYMPTOMS OF HYPOGLYCEMIA 100 strip 11  ? hydrALAZINE (APRESOLINE) 25 MG tablet Take 1 tablet (25 mg total) by mouth 3 (three) times daily. 90 tablet 3  ? letrozole (FEMARA) 2.5 MG tablet TAKE ONE TABLET BY MOUTH DAILY 30 tablet 0  ? loperamide (IMODIUM) 2 MG capsule Take 2 mg by mouth as needed for diarrhea or loose stools. Take 2 tabs (4 mg) with first loose stool, then 1 tab (2 mg) for each additional loose stool. Do not take more than 8 tabs (16 mg) in a 24 hour period.    ? Metamucil Fiber CHEW Chew 1 tablet by mouth daily.    ? metFORMIN (GLUCOPHAGE) 500 MG tablet TAKE ONE TABLET BY MOUTH TWICE A DAY WITH A MEAL 180 tablet 1  ? Multiple Vitamin (MULTIVITAMIN WITH MINERALS) TABS tablet Take 1 tablet by mouth daily. 30 tablet 0  ? ondansetron (ZOFRAN) 8 MG tablet Take 1 tablet (8 mg total) by mouth every 8 (eight) hours as needed for nausea or vomiting. 30 tablet 2  ? prochlorperazine (COMPAZINE) 10 MG tablet Take 1 tablet (10 mg total) by mouth every 6 (six) hours as needed for nausea or vomiting. 30 tablet 2  ? ?No current facility-administered medications for this visit.  ? ? ?Drug-Drug Interactions (DDIs) ?DDIs were evaluated? Yes ?Significant DDIs? No  ?The patient was instructed to speak with their health care provider and/or the oral chemotherapy pharmacist before starting any new drug, including prescription or over the counter, natural / herbal products, or vitamins. ? ?Supportive Care ?Diarrhea: we reviewed that diarrhea is common with abemaciclib and  confirmed that she does have loperamide (Imodium) at home.  We reviewed how to take this medication PRN ?Neutropenia: we discussed the importance of having a thermometer and what the Centers for Disease Control and P

## 2021-11-15 ENCOUNTER — Inpatient Hospital Stay: Payer: Medicare HMO | Admitting: Pharmacist

## 2021-11-15 ENCOUNTER — Inpatient Hospital Stay: Payer: Medicare HMO

## 2021-11-28 ENCOUNTER — Inpatient Hospital Stay: Payer: Medicare HMO | Admitting: Pharmacist

## 2021-11-28 ENCOUNTER — Inpatient Hospital Stay: Payer: Medicare HMO

## 2021-11-28 ENCOUNTER — Other Ambulatory Visit: Payer: Self-pay | Admitting: Hematology and Oncology

## 2021-11-28 NOTE — Telephone Encounter (Signed)
Hey Heather Rogers, can you please review and refill if needed. Thanks  

## 2021-11-30 ENCOUNTER — Encounter: Payer: Self-pay | Admitting: Student

## 2021-12-01 NOTE — Telephone Encounter (Signed)
Received the following message from patient:  ? ?"Hi.  Per our conversation at my moms  last appointment ?.I wasn?t able to get any fluid today when I drained her. On April 29th I got 50 ml.   Should we schedule X-rays now or an office visit? ?I can be reached at [phone] or by My Chart.   ?  ?Heather Rogers" ? ?I sent a message to Heather Rogers to see if the patient was having any discomfort from the draining.  ? ?Dr. Verlee Monte, can you please advise? Thanks!  ?

## 2021-12-04 ENCOUNTER — Inpatient Hospital Stay: Payer: Medicare HMO | Admitting: Pharmacist

## 2021-12-04 ENCOUNTER — Telehealth: Payer: Self-pay | Admitting: Student

## 2021-12-04 ENCOUNTER — Inpatient Hospital Stay: Payer: Medicare HMO | Attending: Hematology and Oncology

## 2021-12-04 ENCOUNTER — Other Ambulatory Visit: Payer: Self-pay

## 2021-12-04 VITALS — BP 161/66 | HR 78 | Temp 97.5°F | Resp 16 | Ht 62.0 in | Wt 138.9 lb

## 2021-12-04 DIAGNOSIS — C50919 Malignant neoplasm of unspecified site of unspecified female breast: Secondary | ICD-10-CM

## 2021-12-04 DIAGNOSIS — Z79811 Long term (current) use of aromatase inhibitors: Secondary | ICD-10-CM | POA: Insufficient documentation

## 2021-12-04 DIAGNOSIS — Z79899 Other long term (current) drug therapy: Secondary | ICD-10-CM | POA: Insufficient documentation

## 2021-12-04 DIAGNOSIS — C50912 Malignant neoplasm of unspecified site of left female breast: Secondary | ICD-10-CM | POA: Diagnosis not present

## 2021-12-04 DIAGNOSIS — R110 Nausea: Secondary | ICD-10-CM | POA: Insufficient documentation

## 2021-12-04 DIAGNOSIS — D709 Neutropenia, unspecified: Secondary | ICD-10-CM | POA: Insufficient documentation

## 2021-12-04 DIAGNOSIS — K5900 Constipation, unspecified: Secondary | ICD-10-CM | POA: Diagnosis not present

## 2021-12-04 DIAGNOSIS — J90 Pleural effusion, not elsewhere classified: Secondary | ICD-10-CM

## 2021-12-04 DIAGNOSIS — Z170 Estrogen receptor positive status [ER+]: Secondary | ICD-10-CM | POA: Insufficient documentation

## 2021-12-04 LAB — CBC WITH DIFFERENTIAL (CANCER CENTER ONLY)
Abs Immature Granulocytes: 0.01 10*3/uL (ref 0.00–0.07)
Basophils Absolute: 0 10*3/uL (ref 0.0–0.1)
Basophils Relative: 1 %
Eosinophils Absolute: 0.1 10*3/uL (ref 0.0–0.5)
Eosinophils Relative: 3 %
HCT: 32.2 % — ABNORMAL LOW (ref 36.0–46.0)
Hemoglobin: 10.4 g/dL — ABNORMAL LOW (ref 12.0–15.0)
Immature Granulocytes: 0 %
Lymphocytes Relative: 35 %
Lymphs Abs: 1.2 10*3/uL (ref 0.7–4.0)
MCH: 23.7 pg — ABNORMAL LOW (ref 26.0–34.0)
MCHC: 32.3 g/dL (ref 30.0–36.0)
MCV: 73.3 fL — ABNORMAL LOW (ref 80.0–100.0)
Monocytes Absolute: 0.3 10*3/uL (ref 0.1–1.0)
Monocytes Relative: 9 %
Neutro Abs: 1.7 10*3/uL (ref 1.7–7.7)
Neutrophils Relative %: 52 %
Platelet Count: 247 10*3/uL (ref 150–400)
RBC: 4.39 MIL/uL (ref 3.87–5.11)
RDW: 19.2 % — ABNORMAL HIGH (ref 11.5–15.5)
WBC Count: 3.3 10*3/uL — ABNORMAL LOW (ref 4.0–10.5)
nRBC: 0 % (ref 0.0–0.2)

## 2021-12-04 LAB — CMP (CANCER CENTER ONLY)
ALT: 16 U/L (ref 0–44)
AST: 18 U/L (ref 15–41)
Albumin: 4.1 g/dL (ref 3.5–5.0)
Alkaline Phosphatase: 53 U/L (ref 38–126)
Anion gap: 6 (ref 5–15)
BUN: 24 mg/dL — ABNORMAL HIGH (ref 8–23)
CO2: 24 mmol/L (ref 22–32)
Calcium: 10 mg/dL (ref 8.9–10.3)
Chloride: 106 mmol/L (ref 98–111)
Creatinine: 1.41 mg/dL — ABNORMAL HIGH (ref 0.44–1.00)
GFR, Estimated: 39 mL/min — ABNORMAL LOW (ref >60)
Glucose, Bld: 101 mg/dL — ABNORMAL HIGH (ref 70–99)
Potassium: 4.3 mmol/L (ref 3.5–5.1)
Sodium: 136 mmol/L (ref 135–145)
Total Bilirubin: 0.3 mg/dL (ref 0.3–1.2)
Total Protein: 7.9 g/dL (ref 6.5–8.1)

## 2021-12-04 NOTE — Telephone Encounter (Signed)
Dr. Verlee Monte, please advise.  ?Patient is not having any discomfort from drain.  ?

## 2021-12-04 NOTE — Telephone Encounter (Signed)
Called pt and there was no answer-LMTCB °

## 2021-12-04 NOTE — Progress Notes (Signed)
[place]  ?     Telephone: [phone]?Fax: [phone]  ? ?Oncology Clinical Pharmacist Practitioner Progress Note ? ?Heather Rogers was contacted via in-person to discuss her chemotherapy regimen for abemaciclib which they receive under the care of Dr. Nicholas Lose. ?  ?Current treatment regimen and start date ?Abemaciclib (10/08/21) ?Letrozole (09/14/21) ?  ?Interval History ?She continues on abemaciclib 50 mg by mouth every 12 hours on days 1 to 28 of a 28-day cycle. This is being given  in combination with letrozole . Therapy is planned to continue until disease progression or unacceptable toxicity.  ? ?Response to Therapy ?Heather Rogers was seen today by clinical pharmacy as a follow-up to her abemaciclib management.  She is accompanied today by her daughter Heather Rogers.  Overall Heather Rogers is doing well.  She reports that she had 2 bouts of nausea last week on different days.  This was relieved by 1 dose of ondansetron on both occasions.  Her daughter states that she did get a little bit constipated and they used Dulcolax and MiraLAX with success.  Heather Rogers has also been drinking plenty of fluids which likely helped alleviate the constipation and her serum creatinine levels have come down slightly from our last visit.  Her absolute neutrophil count has gone up to an estimated 1700 cells/?L today.  This is up from 1200 at her last visit.  Her blood pressure continues to be managed by Dr. Margarita Rana.  They have a follow-up appointment with their office on 01/08/22.  Heather Rogers's daughter states today that her blood pressure readings have been in the 242'P systolic and in the 53'I diastolic at home.  She reports that blood pressures tend to run high when in the clinic. ? ?We spoke to Dr. Lindi Adie about restaging scans and we will order a CT of the chest without contrast and a thyroid ultrasound per radiology's recommendations at her last CT scan.  These will be done sometime in mid June and she  will have a follow-up appointment with labs at the end of June with Dr. Lindi Adie.  At that time if she is still tolerating the abemaciclib well, manufacturing guidelines state that labs can be as clinically indicated.  We discussed with Heather Rogers and her daughter that we will likely extend labs to every 3 months at that time.  Heather Rogers and her daughter verbalized understanding of the plan.  We did send a refill request to [facility] on 11/28/21.  [name] daughter states that they have not received that medication yet but will be reaching out to [facility] for status. Labs, vitals, treatment parameters, and manufacturer guidelines assessing toxicity were reviewed with Heather Rogers today. Based on these values, patient is in agreement to continue abemaciclib therapy at this time. ? ?Allergies ?Allergies  ?Allergen Reactions  ? Sulfa Antibiotics   ?  Itching ?  ? ? ?Vitals ? ?  12/04/2021  ? 10:57 AM 11/13/2021  ? 10:11 AM 11/07/2021  ? 10:38 AM  ?Vitals with BMI  ?Height 5' 2"  5' 2"  5' 2"   ?Weight 138 lbs 14 oz 140 lbs 13 oz 140 lbs  ?BMI 25.4 25.75 25.6  ?Systolic 144 315 400  ?Diastolic 66 64 64  ?Pulse 78 72 74  ?  ? ?Laboratory Data ? ?  Latest Ref Rng & Units 12/04/2021  ? 10:34 AM 11/13/2021  ? 10:03 AM 10/31/2021  ? 10:22 AM  ?CBC EXTENDED  ?WBC 4.0 - 10.5 K/uL 3.3  2.9   2.9    ?RBC 3.87 - 5.11 MIL/uL 4.39   4.32   4.22    ?Hemoglobin 12.0 - 15.0 g/dL 10.4   10.0   9.7    ?HCT 36.0 - 46.0 % 32.2   31.4   30.3    ?Platelets 150 - 400 K/uL 247   258   166    ?NEUT# 1.7 - 7.7 K/uL 1.7   1.2   1.3    ?Lymph# 0.7 - 4.0 K/uL 1.2   1.2   1.3    ?  ? ?  Latest Ref Rng & Units 12/04/2021  ? 10:34 AM 11/13/2021  ? 10:03 AM 10/31/2021  ? 10:22 AM  ?CMP  ?Glucose 70 - 99 mg/dL 101   118   98    ?BUN 8 - 23 mg/dL 24   24   26     ?Creatinine 0.44 - 1.00 mg/dL 1.41   1.45   1.49    ?Sodium 135 - 145 mmol/L 136   140   140    ?Potassium 3.5 - 5.1 mmol/L 4.3   4.1   4.6    ?Chloride 98 - 111 mmol/L 106   107   108    ?CO2 22 - 32  mmol/L 24   29   28     ?Calcium 8.9 - 10.3 mg/dL 10.0   10.3   10.1    ?Total Protein 6.5 - 8.1 g/dL 7.9   7.5   7.0    ?Total Bilirubin 0.3 - 1.2 mg/dL 0.3   0.3   0.2    ?Alkaline Phos 38 - 126 U/L 53   58   55    ?AST 15 - 41 U/L 18   19   15     ?ALT 0 - 44 U/L 16   17   13     ?  ?Lab Results  ?Component Value Date  ? MG 2.5 (H) 09/07/2021  ? MG 2.6 (H) 09/03/2021  ? MG 2.5 (H) 07/27/2020  ? ? ?Adverse Effects Assessment ?Nausea: 2 episodes last week on separate days.  No vomiting.  Alleviated by ondansetron x 1 dose on both occasions. ?Constipation: Likely from ondansetron.  Continue to use Dulcolax and MiraLAX as needed which have worked in the past for Heather Rogers. ?Neutropenia: Absolute neutrophil count has increased from 1200 to an estimated at 1700 today.  We will continue to monitor. ?Hemoglobin: Increased to 10.4 g/dL up from 10 g/dL at last visit.  We will continue to monitor. ? ?Adherence Assessment ?Heather Rogers reports missing 0 doses over the past 4 weeks.   ?Reason for missed dose: N/A ?Patient was re-educated on importance of adherence.  ? ?Access Assessment ?Heather Rogers is currently receiving her abemaciclib through [facility] ?Insurance concerns: None ? ?Medication Reconciliation ?The patient's medication list was reviewed today with the patient?  Yes ?New medications or herbal supplements have recently been started?  No ?Any medications have been discontinued?  No ?The medication list was updated and reconciled based on the patient's most recent medication list in the electronic medical record (EMR) including herbal products and OTC medications.  ? ?Medications ?Current Outpatient Medications  ?Medication Sig Dispense Refill  ? abemaciclib ([name]) 50 MG tablet TAKE 1 TABLET BY MOUTH TWICE DAILY. SWALLOW TABLETS WHOLE. DO NOT CRUSH, CHEW OR SPLIT. 56 tablet 3  ? Accu-Chek FastClix Lancets MISC Check blood sugar fasting and before meals and again if pt feels  bad  (symptoms of hypo). 100 each 12  ? allopurinol (ZYLOPRIM) 100 MG tablet Take 1 tablet (100 mg total) by mouth daily. 90 tablet 1  ? amLODipine (NORVASC) 10 MG tablet Take 10 mg by mouth daily.    ? aspirin EC 81 MG tablet Take 81 mg by mouth daily. Swallow whole.    ? atorvastatin (LIPITOR) 80 MG tablet TAKE ONE TABLET BY MOUTH EVERY EVENING TO LOWER CHOLESTEROL 90 tablet 1  ? Blood Glucose Monitoring Suppl (ACCU-CHEK GUIDE) w/Device KIT 1 each by Does not apply route as directed. 1 kit 0  ? carvedilol (COREG) 25 MG tablet Take 1 tablet (25 mg total) by mouth 2 (two) times daily with a meal. 180 tablet 1  ? cholecalciferol (VITAMIN D3) 25 MCG (1000 UNIT) tablet Take 1,000 Units by mouth daily.    ? ferrous sulfate 325 (65 FE) MG EC tablet Take 1 tablet (325 mg total) by mouth in the morning and at bedtime. 60 tablet 1  ? gabapentin (NEURONTIN) 100 MG capsule Take 1 capsule (100 mg total) by mouth 3 (three) times daily. 270 capsule 1  ? glucose blood (ACCU-CHEK GUIDE) test strip USE TO CHECK FASTING BLOOD SUGAR AND BEFORE MEALS AND AGAIN IF PATIENT FEELS BAD; SYMPTOMS OF HYPOGLYCEMIA 100 strip 11  ? hydrALAZINE (APRESOLINE) 25 MG tablet Take 1 tablet (25 mg total) by mouth 3 (three) times daily. 90 tablet 3  ? letrozole (FEMARA) 2.5 MG tablet TAKE ONE TABLET BY MOUTH DAILY 30 tablet 0  ? loperamide (IMODIUM) 2 MG capsule Take 2 mg by mouth as needed for diarrhea or loose stools. Take 2 tabs (4 mg) with first loose stool, then 1 tab (2 mg) for each additional loose stool. Do not take more than 8 tabs (16 mg) in a 24 hour period.    ? Metamucil Fiber CHEW Chew 1 tablet by mouth daily.    ? metFORMIN (GLUCOPHAGE) 500 MG tablet TAKE ONE TABLET BY MOUTH TWICE A DAY WITH A MEAL 180 tablet 1  ? Multiple Vitamin (MULTIVITAMIN WITH MINERALS) TABS tablet Take 1 tablet by mouth daily. 30 tablet 0  ? ondansetron (ZOFRAN) 8 MG tablet Take 1 tablet (8 mg total) by mouth every 8 (eight) hours as needed for nausea or vomiting. 30  tablet 2  ? prochlorperazine (COMPAZINE) 10 MG tablet Take 1 tablet (10 mg total) by mouth every 6 (six) hours as needed for nausea or vomiting. 30 tablet 2  ? ?No current facility-administered medications for

## 2021-12-04 NOTE — Telephone Encounter (Signed)
Can you reach out to Heather Rogers to make sure she's aware to come in for a chest x ray? I'll let her know after x-ray if we need to schedule appointment for pleurX removal. ? ?Thanks! ?

## 2021-12-07 ENCOUNTER — Ambulatory Visit: Payer: Medicare HMO | Admitting: Student

## 2021-12-12 ENCOUNTER — Inpatient Hospital Stay: Payer: Medicare HMO | Admitting: Pharmacist

## 2021-12-12 ENCOUNTER — Inpatient Hospital Stay: Payer: Medicare HMO

## 2021-12-13 ENCOUNTER — Telehealth: Payer: Self-pay | Admitting: Student

## 2021-12-13 DIAGNOSIS — C50919 Malignant neoplasm of unspecified site of unspecified female breast: Secondary | ICD-10-CM | POA: Diagnosis not present

## 2021-12-13 DIAGNOSIS — J910 Malignant pleural effusion: Secondary | ICD-10-CM | POA: Diagnosis not present

## 2021-12-13 NOTE — Telephone Encounter (Signed)
Called [facility] about the phone call we received. Was told by rep that they had everything taken care of for pt and had already received the diagnosis code. Nothing further needed. ?

## 2021-12-14 ENCOUNTER — Other Ambulatory Visit: Payer: Self-pay | Admitting: Hematology and Oncology

## 2021-12-15 MED ORDER — LETROZOLE 2.5 MG PO TABS
2.5000 mg | ORAL_TABLET | Freq: Every day | ORAL | 3 refills | Status: DC
Start: 2021-12-15 — End: 2023-01-11

## 2021-12-21 ENCOUNTER — Inpatient Hospital Stay: Payer: Medicare HMO | Admitting: Pharmacist

## 2021-12-21 ENCOUNTER — Inpatient Hospital Stay: Payer: Medicare HMO

## 2021-12-21 VITALS — BP 170/69 | HR 76 | Temp 97.5°F | Resp 16 | Ht 62.0 in | Wt 140.5 lb

## 2021-12-21 DIAGNOSIS — C50912 Malignant neoplasm of unspecified site of left female breast: Secondary | ICD-10-CM | POA: Diagnosis not present

## 2021-12-21 DIAGNOSIS — Z170 Estrogen receptor positive status [ER+]: Secondary | ICD-10-CM | POA: Diagnosis not present

## 2021-12-21 DIAGNOSIS — Z79811 Long term (current) use of aromatase inhibitors: Secondary | ICD-10-CM | POA: Diagnosis not present

## 2021-12-21 DIAGNOSIS — C50919 Malignant neoplasm of unspecified site of unspecified female breast: Secondary | ICD-10-CM

## 2021-12-21 DIAGNOSIS — R110 Nausea: Secondary | ICD-10-CM | POA: Diagnosis not present

## 2021-12-21 DIAGNOSIS — D709 Neutropenia, unspecified: Secondary | ICD-10-CM | POA: Diagnosis not present

## 2021-12-21 DIAGNOSIS — K5900 Constipation, unspecified: Secondary | ICD-10-CM | POA: Diagnosis not present

## 2021-12-21 DIAGNOSIS — Z79899 Other long term (current) drug therapy: Secondary | ICD-10-CM | POA: Diagnosis not present

## 2021-12-21 LAB — CMP (CANCER CENTER ONLY)
ALT: 15 U/L (ref 0–44)
AST: 19 U/L (ref 15–41)
Albumin: 4.2 g/dL (ref 3.5–5.0)
Alkaline Phosphatase: 60 U/L (ref 38–126)
Anion gap: 6 (ref 5–15)
BUN: 16 mg/dL (ref 8–23)
CO2: 26 mmol/L (ref 22–32)
Calcium: 10.2 mg/dL (ref 8.9–10.3)
Chloride: 105 mmol/L (ref 98–111)
Creatinine: 1.4 mg/dL — ABNORMAL HIGH (ref 0.44–1.00)
GFR, Estimated: 39 mL/min — ABNORMAL LOW (ref >60)
Glucose, Bld: 111 mg/dL — ABNORMAL HIGH (ref 70–99)
Potassium: 4.5 mmol/L (ref 3.5–5.1)
Sodium: 137 mmol/L (ref 135–145)
Total Bilirubin: 0.3 mg/dL (ref 0.3–1.2)
Total Protein: 7.7 g/dL (ref 6.5–8.1)

## 2021-12-21 LAB — CBC WITH DIFFERENTIAL (CANCER CENTER ONLY)
Abs Immature Granulocytes: 0.02 10*3/uL (ref 0.00–0.07)
Basophils Absolute: 0.1 10*3/uL (ref 0.0–0.1)
Basophils Relative: 1 %
Eosinophils Absolute: 0.1 10*3/uL (ref 0.0–0.5)
Eosinophils Relative: 3 %
HCT: 32 % — ABNORMAL LOW (ref 36.0–46.0)
Hemoglobin: 10.3 g/dL — ABNORMAL LOW (ref 12.0–15.0)
Immature Granulocytes: 1 %
Lymphocytes Relative: 32 %
Lymphs Abs: 1.3 10*3/uL (ref 0.7–4.0)
MCH: 23.8 pg — ABNORMAL LOW (ref 26.0–34.0)
MCHC: 32.2 g/dL (ref 30.0–36.0)
MCV: 74.1 fL — ABNORMAL LOW (ref 80.0–100.0)
Monocytes Absolute: 0.4 10*3/uL (ref 0.1–1.0)
Monocytes Relative: 8 %
Neutro Abs: 2.3 10*3/uL (ref 1.7–7.7)
Neutrophils Relative %: 55 %
Platelet Count: 272 10*3/uL (ref 150–400)
RBC: 4.32 MIL/uL (ref 3.87–5.11)
RDW: 19.2 % — ABNORMAL HIGH (ref 11.5–15.5)
WBC Count: 4.2 10*3/uL (ref 4.0–10.5)
nRBC: 0 % (ref 0.0–0.2)

## 2021-12-21 NOTE — Progress Notes (Signed)
[place]       Telephone: [phone]?Fax: [phone]   Oncology Clinical Pharmacist Practitioner Progress Note  Heather Rogers was contacted via in-person to discuss her chemotherapy regimen for abemaciclib which they receive under the care of Dr. Nicholas Lose.   Current treatment regimen and start date Abemaciclib (10/08/21) Letrozole (09/14/21)   Interval History She continues on abemaciclib 50 mg by mouth every 12 hours on days 1 to 28 of a 28-day cycle. This is being given  in combination with letrozole . Therapy is planned to continue until disease progression or unacceptable toxicity.  Response to Therapy Heather Rogers is doing well.  She is here today as a follow-up to her abemaciclib management.  She is accompanied by her daughter Heather Rogers.  Clinical pharmacy last saw her 12/04/21.  She continues to follow with pulmonology for her PleurX management.  Today they describe some dry skin near the PleurX site.  Her daughter states that she has been washing this area with soap and water and letting the area dry.  However, they are not using any sort of moisturizers to the area.  We discussed that using a sensitive skin moisturizer lotion may help.  We discussed to just make sure that they are careful near the incision site.  They do not report any rash or discharge from the injection site.  Heather Rogers continues to have occasional constipation.  They continue to use Miralax and Dulcolax with success.  She also has occasional nausea which is alleviated by ondansetron.  Her blood pressure continues to be managed by Dr. Margarita Rana and she has a follow-up appointment with her on 01/08/22.  Her blood pressure is slightly elevated today but they do check it at home and report normal values at home.  They also state that when they's visit with Dr. Margarita Rana, Heather Rogers needs to sit for a while before her blood pressure will normalize.  Restaging scans have been ordered and will be done  sometime in June.  She will then have a follow-up appointment with labs and see Dr. Lindi Adie on 01/18/22.  If those scans show stable disease or improvement, she will likely continue on abemaciclib and letrozole.  If that is the case, clinical pharmacy will plan on seeing Heather Rogers sometime in mid September. Labs, vitals, treatment parameters, and manufacturer guidelines assessing toxicity were reviewed with Heather Rogers today. Based on these values, patient is in agreement to continue abemaciclib therapy at this time.  Allergies Allergies  Allergen Reactions   Sulfa Antibiotics     Itching     Vitals    12/04/2021   10:57 AM 11/13/2021   10:11 AM 11/07/2021   10:38 AM  Vitals with BMI  Height _0  _1  _2   Weight 138 lbs 14 oz 140 lbs 13 oz 140 lbs  BMI 25.4 57.32 20.2  Systolic 542 706 237  Diastolic 66 64 64  Pulse 78 72 74     Laboratory Data    Latest Ref Rng & Units 12/21/2021   10:32 AM 12/04/2021   10:34 AM 11/13/2021   10:03 AM  CBC EXTENDED  WBC 4.0 - 10.5 K/uL 4.2   3.3   2.9    RBC 3.87 - 5.11 MIL/uL 4.32   4.39   4.32    Hemoglobin 12.0 - 15.0 g/dL 10.3   10.4   10.0    HCT 36.0 - 46.0 % 32.0   32.2   31.4  Platelets 150 - 400 K/uL 272   247   258    NEUT# 1.7 - 7.7 K/uL 2.3   1.7   1.2    Lymph# 0.7 - 4.0 K/uL 1.3   1.2   1.2         Latest Ref Rng & Units 12/04/2021   10:34 AM 11/13/2021   10:03 AM 10/31/2021   10:22 AM  CMP  Glucose 70 - 99 mg/dL 101   118   98    BUN 8 - 23 mg/dL _0 Creatinine 0.44 - 1.00 mg/dL 1.41   1.45   1.49    Sodium 135 - 145 mmol/L 136   140   140    Potassium 3.5 - 5.1 mmol/L 4.3   4.1   4.6    Chloride 98 - 111 mmol/L 106   107   108    CO2 22 - 32 mmol/L _1 Calcium 8.9 - 10.3 mg/dL 10.0   10.3   10.1    Total Protein 6.5 - 8.1 g/dL 7.9   7.5   7.0    Total Bilirubin 0.3 - 1.2 mg/dL 0.3   0.3   0.2    Alkaline Phos 38 - 126 U/L 53   58   55    AST 15 - 41 U/L _2 ALT 0 - 44 U/L  _3 Lab Results  Component Value Date   MG 2.5 (H) 09/07/2021   MG 2.6 (H) 09/03/2021   MG 2.5 (H) 07/27/2020    Adverse Effects Assessment Nausea: Ondansetron as needed working well. Constipation: Using Dulcolax and Miralax as needed with success.   Blood pressure: Managed by Dr. Margarita Rana with  [facility].  Follow-up appointment scheduled for 01/08/22  Adherence Assessment Heather Rogers reports missing 0 doses over the past 4 weeks.   Reason for missed dose: N/A Patient was re-educated on importance of adherence.   Access Assessment Heather Rogers is currently receiving her abemaciclib through [facility].  [name] and her daughter state that the shipping issues they were experiencing with [facility] have resolved Insurance concerns: None  Medication Reconciliation The patient's medication list was reviewed today with the patient?  Yes New medications or herbal supplements have recently been started?  No Any medications have been discontinued?  No The medication list was updated and reconciled based on the patient's most recent medication list in the electronic medical record (EMR) including herbal products and OTC medications.   Medications Current Outpatient Medications  Medication Sig Dispense Refill   abemaciclib ([name]) 50 MG tablet TAKE 1 TABLET BY MOUTH TWICE DAILY. SWALLOW TABLETS WHOLE. DO NOT CRUSH, CHEW OR SPLIT. 56 tablet 3   Accu-Chek FastClix Lancets MISC Check blood sugar fasting and before meals and again if pt feels bad (symptoms of hypo). 100 each 12   allopurinol (ZYLOPRIM) 100 MG tablet Take 1 tablet (100 mg total) by mouth daily. 90 tablet 1   amLODipine (NORVASC) 10 MG tablet Take 10 mg by mouth daily.     aspirin EC 81 MG tablet Take 81 mg by mouth daily. Swallow whole.     atorvastatin (LIPITOR) 80 MG tablet TAKE ONE TABLET BY MOUTH EVERY EVENING TO LOWER CHOLESTEROL 90 tablet 1  Blood  Glucose Monitoring Suppl (ACCU-CHEK GUIDE) w/Device KIT 1 each by Does not apply route as directed. 1 kit 0   carvedilol (COREG) 25 MG tablet Take 1 tablet (25 mg total) by mouth 2 (two) times daily with a meal. 180 tablet 1   cholecalciferol (VITAMIN D3) 25 MCG (1000 UNIT) tablet Take 1,000 Units by mouth daily.     ferrous sulfate 325 (65 FE) MG EC tablet Take 1 tablet (325 mg total) by mouth in the morning and at bedtime. 60 tablet 1   gabapentin (NEURONTIN) 100 MG capsule Take 1 capsule (100 mg total) by mouth 3 (three) times daily. 270 capsule 1   glucose blood (ACCU-CHEK GUIDE) test strip USE TO CHECK FASTING BLOOD SUGAR AND BEFORE MEALS AND AGAIN IF PATIENT FEELS BAD; SYMPTOMS OF HYPOGLYCEMIA 100 strip 11   hydrALAZINE (APRESOLINE) 25 MG tablet Take 1 tablet (25 mg total) by mouth 3 (three) times daily. 90 tablet 3   letrozole (FEMARA) 2.5 MG tablet Take 1 tablet (2.5 mg total) by mouth daily. 90 tablet 3   loperamide (IMODIUM) 2 MG capsule Take 2 mg by mouth as needed for diarrhea or loose stools. Take 2 tabs (4 mg) with first loose stool, then 1 tab (2 mg) for each additional loose stool. Do not take more than 8 tabs (16 mg) in a 24 hour period.     Metamucil Fiber CHEW Chew 1 tablet by mouth daily.     metFORMIN (GLUCOPHAGE) 500 MG tablet TAKE ONE TABLET BY MOUTH TWICE A DAY WITH A MEAL 180 tablet 1   Multiple Vitamin (MULTIVITAMIN WITH MINERALS) TABS tablet Take 1 tablet by mouth daily. 30 tablet 0   ondansetron (ZOFRAN) 8 MG tablet Take 1 tablet (8 mg total) by mouth every 8 (eight) hours as needed for nausea or vomiting. 30 tablet 2   prochlorperazine (COMPAZINE) 10 MG tablet Take 1 tablet (10 mg total) by mouth every 6 (six) hours as needed for nausea or vomiting. 30 tablet 2   No current facility-administered medications for this visit.    Drug-Drug Interactions (DDIs) DDIs were evaluated?  Yes Significant DDIs?  No The patient was instructed to speak with their health care  provider and/or the oral chemotherapy pharmacist before starting any new drug, including prescription or over the counter, natural / herbal products, or vitamins.  Supportive Care Diarrhea: we reviewed that diarrhea is common with abemaciclib and confirmed that she does have loperamide (Imodium) at home.  We reviewed how to take this medication PRN Neutropenia: we discussed the importance of having a thermometer and what the Centers for Disease Control and Prevention (CDC) considers a fever which is 100.34F (38C) or higher.  Gave patient 24/7 triage line to call if any fevers or symptoms ILD/Pneumonitis: we reviewed potential symptoms including cough, shortness, and fatigue.  VTE: reviewed signs of DVT such as leg swelling, redness, pain, or tenderness and signs of PE such as shortness of breath, rapid or irregular heartbeat, cough, chest pain, or lightheadedness Reviewed to take the medication every 12 hours (with food sometimes can be easier on the stomach) and to take it at the same time every day.   Dosing Assessment Hepatic adjustments needed? No  Renal adjustments needed? No  Toxicity adjustments needed? No  The current dosing regimen is appropriate to continue at this time.  Follow-Up Plan Continue abemaciclib 50 mg every 12 hours Consider sensitive skin lotion for dry areas near PleurX site.  Monitor for any signs of  rash more signs of infection. Restaging scans and thyroid ultrasound scheduled for June with follow-up visit and labs with Dr. Lindi Adie on 01/18/22 We will add labs and pharmacy clinic visit for third week of September Continue with Dr. Margarita Rana for blood pressure management  Heather Rogers participated in the discussion, expressed understanding, and voiced agreement with the above plan. All questions were answered to her satisfaction. The patient was advised to contact the clinic at [phone] with any questions or concerns prior to her return visit.   I spent 30  minutes assessing and educating the patient.  Raina Mina, RPH-CPP, 12/21/2021  10:42 AM   **Disclaimer: This note was dictated with voice recognition software. Similar sounding words can inadvertently be transcribed and this note may contain transcription errors which may not have been corrected upon publication of note.**

## 2022-01-04 NOTE — Progress Notes (Signed)
Patient Care Team: Charlott Rakes, MD as PCP - General (Family Medicine) Raina Mina, RPH-CPP as Pharmacist (Hematology and Oncology) Nicholas Lose, MD as Medical Oncologist (Hematology and Oncology)  DIAGNOSIS:  Encounter Diagnoses  Name Primary?   Primary malignant neoplasm of breast with metastasis ([place])    Hypertension associated with diabetes ([place])     SUMMARY OF ONCOLOGIC HISTORY: Oncology History  Breast mass, left  09/04/2021 Initial Diagnosis   Breast mass, left   Primary malignant neoplasm of breast with metastasis ([place])  09/14/2021 Initial Diagnosis   Large neglected left breast cancer for several years.  Hospitalization 09/02/2021-09/08/2021: Shortness of breath, large pleural effusion, thoracentesis, pleural fluid cytology: Metastatic adenocarcinoma breast origin ER/PR positive HER2 negative     CHIEF COMPLIANT: follow up left breast cancer on [name] and Letrozole  INTERVAL HISTORY: Heather Rogers is a 76 y.o. with above-mentioned history of left breast mass, bone lesions, pulmonary nodules, pleural effusion. She presents to the clinic today for follow-up and review scans. States that she has some nausea every other day. States that she takes Zofran. States that she is eating. States that she eat what she likes. States that she eats breakfast and dinner for lunch she takes a supplement. States that she stays constipated. Takes caster oil and imodium for relief. States that she is drinking more.   ALLERGIES:  is allergic to sulfa antibiotics.  MEDICATIONS:  Current Outpatient Medications  Medication Sig Dispense Refill   abemaciclib ([name]) 50 MG tablet TAKE 1 TABLET BY MOUTH TWICE DAILY. SWALLOW TABLETS WHOLE. DO NOT CRUSH, CHEW OR SPLIT. 56 tablet 3   Accu-Chek FastClix Lancets MISC Check blood sugar fasting and before meals and again if pt feels bad (symptoms of hypo). 100 each 12   allopurinol (ZYLOPRIM) 100 MG tablet Take 1 tablet (100 mg total) by  mouth daily. 90 tablet 1   amLODipine (NORVASC) 10 MG tablet Take 10 mg by mouth daily.     aspirin EC 81 MG tablet Take 81 mg by mouth daily. Swallow whole.     atorvastatin (LIPITOR) 80 MG tablet TAKE ONE TABLET BY MOUTH EVERY EVENING TO LOWER CHOLESTEROL 90 tablet 1   Blood Glucose Monitoring Suppl (ACCU-CHEK GUIDE) w/Device KIT 1 each by Does not apply route as directed. 1 kit 0   carvedilol (COREG) 25 MG tablet Take 1 tablet (25 mg total) by mouth 2 (two) times daily with a meal. 180 tablet 1   cholecalciferol (VITAMIN D3) 25 MCG (1000 UNIT) tablet Take 1,000 Units by mouth daily.     ferrous sulfate 325 (65 FE) MG EC tablet Take 1 tablet (325 mg total) by mouth in the morning and at bedtime. 60 tablet 1   gabapentin (NEURONTIN) 100 MG capsule Take 1 capsule (100 mg total) by mouth 3 (three) times daily. 270 capsule 1   glucose blood (ACCU-CHEK GUIDE) test strip USE TO CHECK FASTING BLOOD SUGAR AND BEFORE MEALS AND AGAIN IF PATIENT FEELS BAD; SYMPTOMS OF HYPOGLYCEMIA 100 strip 11   hydrALAZINE (APRESOLINE) 50 MG tablet Take 1 tablet (50 mg total) by mouth 3 (three) times daily. 270 tablet 1   letrozole (FEMARA) 2.5 MG tablet Take 1 tablet (2.5 mg total) by mouth daily. 90 tablet 3   loperamide (IMODIUM) 2 MG capsule Take 2 mg by mouth as needed for diarrhea or loose stools. Take 2 tabs (4 mg) with first loose stool, then 1 tab (2 mg) for each additional loose stool. Do not take more  than 8 tabs (16 mg) in a 24 hour period.     Metamucil Fiber CHEW Chew 1 tablet by mouth daily.     metFORMIN (GLUCOPHAGE) 500 MG tablet Take 1 tablet (500 mg total) by mouth daily with breakfast. TAKE ONE TABLET BY MOUTH TWICE A DAY WITH A MEAL 90 tablet 1   Multiple Vitamin (MULTIVITAMIN WITH MINERALS) TABS tablet Take 1 tablet by mouth daily. 30 tablet 0   ondansetron (ZOFRAN) 8 MG tablet Take 1 tablet (8 mg total) by mouth every 8 (eight) hours as needed for nausea or vomiting. 30 tablet 2   prochlorperazine  (COMPAZINE) 10 MG tablet Take 1 tablet (10 mg total) by mouth every 6 (six) hours as needed for nausea or vomiting. 30 tablet 2   No current facility-administered medications for this visit.    PHYSICAL EXAMINATION: ECOG PERFORMANCE STATUS: 1 - Symptomatic but completely ambulatory  Vitals:   01/18/22 1028  BP: (!) 157/66  Pulse: 77  Resp: 18  Temp: (!) 97.5 F (36.4 C)  SpO2: 100%   Filed Weights   01/18/22 1028  Weight: 137 lb 3.2 oz (62.2 kg)      LABORATORY DATA:  I have reviewed the data as listed    Latest Ref Rng & Units 01/18/2022   10:01 AM 12/21/2021   10:32 AM 12/04/2021   10:34 AM  CMP  Glucose 70 - 99 mg/dL 117  111  101   BUN 8 - 23 mg/dL 18  16  24   Creatinine 0.44 - 1.00 mg/dL 1.22  1.40  1.41   Sodium 135 - 145 mmol/L 139  137  136   Potassium 3.5 - 5.1 mmol/L 4.2  4.5  4.3   Chloride 98 - 111 mmol/L 107  105  106   CO2 22 - 32 mmol/L 26  26  24   Calcium 8.9 - 10.3 mg/dL 10.1  10.2  10.0   Total Protein 6.5 - 8.1 g/dL 7.7  7.7  7.9   Total Bilirubin 0.3 - 1.2 mg/dL 0.3  0.3  0.3   Alkaline Phos 38 - 126 U/L 56  60  53   AST 15 - 41 U/L 18  19  18   ALT 0 - 44 U/L 14  15  16     Lab Results  Component Value Date   WBC 3.4 (L) 01/18/2022   HGB 9.9 (L) 01/18/2022   HCT 31.3 (L) 01/18/2022   MCV 75.8 (L) 01/18/2022   PLT 245 01/18/2022   NEUTROABS 1.9 01/18/2022    ASSESSMENT & PLAN:  Primary malignant neoplasm of breast with metastasis ([facility]) 09/14/2021:Large neglected left breast cancer for several years.  Hospitalization 09/02/2021-09/08/2021: Shortness of breath, large pleural effusion, thoracentesis, pleural fluid cytology: Metastatic adenocarcinoma breast origin ER 40%, PR 30%, Ki-67 5%, HER2 2+ by IHC, FISH negative ratio 1.43   Current treatment: [name] with letrozole   Toxicities: No major side effects to treatment. She gets constipated regularly. 2. Mild nausea for which she uses Zofran 3.  Diminished appetite  01/15/22: Ct Chest:  Large ulcerated breast mass slightly decreased in size. Axill and left IM LN dec in size, Multiple pulmonary and pleural nodules smaller, unchaged sternal met, and left 3 and 4 ribs  Thyroid nodule: We discussed the risks and benefits of obtaining a biopsy.  She is not keen on doing it at this time.  Based on response to treatment, we will continue the same treatment Our plan is   to obtain another CT chest in 3 months.  I will see her after the next CT scan.  In the interim Jenny Reichmann will be following her.   No orders of the defined types were placed in this encounter.  The patient has a good understanding of the overall plan. she agrees with it. she will call with any problems that may develop before the next visit here. Total time spent: 30 mins including face to face time and time spent for planning, charting and co-ordination of care   Harriette Ohara, MD 01/18/22    I Gardiner Coins am scribing for Dr. Lindi Adie  I have reviewed the above documentation for accuracy and completeness, and I agree with the above.

## 2022-01-08 ENCOUNTER — Encounter: Payer: Self-pay | Admitting: Family Medicine

## 2022-01-08 ENCOUNTER — Ambulatory Visit: Payer: Medicare HMO | Attending: Family Medicine | Admitting: Family Medicine

## 2022-01-08 VITALS — BP 159/66 | HR 76 | Temp 98.0°F | Ht 62.0 in | Wt 136.4 lb

## 2022-01-08 DIAGNOSIS — E1159 Type 2 diabetes mellitus with other circulatory complications: Secondary | ICD-10-CM | POA: Diagnosis not present

## 2022-01-08 DIAGNOSIS — I152 Hypertension secondary to endocrine disorders: Secondary | ICD-10-CM

## 2022-01-08 DIAGNOSIS — Z23 Encounter for immunization: Secondary | ICD-10-CM

## 2022-01-08 DIAGNOSIS — C50919 Malignant neoplasm of unspecified site of unspecified female breast: Secondary | ICD-10-CM | POA: Diagnosis not present

## 2022-01-08 DIAGNOSIS — E1141 Type 2 diabetes mellitus with diabetic mononeuropathy: Secondary | ICD-10-CM

## 2022-01-08 DIAGNOSIS — J910 Malignant pleural effusion: Secondary | ICD-10-CM

## 2022-01-08 DIAGNOSIS — K5903 Drug induced constipation: Secondary | ICD-10-CM

## 2022-01-08 LAB — POCT GLYCOSYLATED HEMOGLOBIN (HGB A1C): HbA1c, POC (controlled diabetic range): 5.5 % (ref 0.0–7.0)

## 2022-01-08 LAB — GLUCOSE, POCT (MANUAL RESULT ENTRY): POC Glucose: 81 mg/dl (ref 70–99)

## 2022-01-08 MED ORDER — METFORMIN HCL 500 MG PO TABS: 500.0000 mg | ORAL_TABLET | Freq: Every day | ORAL | 1 refills | Status: DC

## 2022-01-08 MED ORDER — ZOSTER VAC RECOMB ADJUVANTED 50 MCG/0.5ML IM SUSR: 0.5000 mL | Freq: Once | INTRAMUSCULAR | 1 refills | Status: DC

## 2022-01-08 MED ORDER — HYDRALAZINE HCL 50 MG PO TABS: 50.0000 mg | ORAL_TABLET | Freq: Three times a day (TID) | ORAL | 1 refills | Status: DC

## 2022-01-08 MED ORDER — ZOSTER VAC RECOMB ADJUVANTED 50 MCG/0.5ML IM SUSR: 0.5000 mL | Freq: Once | INTRAMUSCULAR | 1 refills | Status: AC

## 2022-01-08 NOTE — Patient Instructions (Signed)
Low-Sodium Eating Plan Sodium, which is an element that makes up salt, helps you maintain a healthy balance of fluids in your body. Too much sodium can increase your blood pressure and cause fluid and waste to be held in your body. Your health care provider or dietitian may recommend following this plan if you have high blood pressure (hypertension), kidney disease, liver disease, or heart failure. Eating less sodium can help lower your blood pressure, reduce swelling, and protect your heart, liver, and kidneys. What are tips for following this plan? Reading food labels The Nutrition Facts label lists the amount of sodium in one serving of the food. If you eat more than one serving, you must multiply the listed amount of sodium by the number of servings. Choose foods with less than 140 mg of sodium per serving. Avoid foods with 300 mg of sodium or more per serving. Shopping  Look for lower-sodium products, often labeled as "low-sodium" or "no salt added." Always check the sodium content, even if foods are labeled as "unsalted" or "no salt added." Buy fresh foods. Avoid canned foods and pre-made or frozen meals. Avoid canned, cured, or processed meats. Buy breads that have less than 80 mg of sodium per slice. Cooking  Eat more home-cooked food and less restaurant, buffet, and fast food. Avoid adding salt when cooking. Use salt-free seasonings or herbs instead of table salt or sea salt. Check with your health care provider or pharmacist before using salt substitutes. Cook with plant-based oils, such as canola, sunflower, or olive oil. Meal planning When eating at a restaurant, ask that your food be prepared with less salt or no salt, if possible. Avoid dishes labeled as brined, pickled, cured, smoked, or made with soy sauce, miso, or teriyaki sauce. Avoid foods that contain MSG (monosodium glutamate). MSG is sometimes added to Chinese food, bouillon, and some canned foods. Make meals that can  be grilled, baked, poached, roasted, or steamed. These are generally made with less sodium. General information Most people on this plan should limit their sodium intake to 1,500-2,000 mg (milligrams) of sodium each day. What foods should I eat? Fruits Fresh, frozen, or canned fruit. Fruit juice. Vegetables Fresh or frozen vegetables. "No salt added" canned vegetables. "No salt added" tomato sauce and paste. Low-sodium or reduced-sodium tomato and vegetable juice. Grains Low-sodium cereals, including oats, puffed wheat and rice, and shredded wheat. Low-sodium crackers. Unsalted rice. Unsalted pasta. Low-sodium bread. Whole-grain breads and whole-grain pasta. Meats and other proteins Fresh or frozen (no salt added) meat, poultry, seafood, and fish. Low-sodium canned tuna and salmon. Unsalted nuts. Dried peas, beans, and lentils without added salt. Unsalted canned beans. Eggs. Unsalted nut butters. Dairy Milk. Soy milk. Cheese that is naturally low in sodium, such as ricotta cheese, fresh mozzarella, or Swiss cheese. Low-sodium or reduced-sodium cheese. Cream cheese. Yogurt. Seasonings and condiments Fresh and dried herbs and spices. Salt-free seasonings. Low-sodium mustard and ketchup. Sodium-free salad dressing. Sodium-free light mayonnaise. Fresh or refrigerated horseradish. Lemon juice. Vinegar. Other foods Homemade, reduced-sodium, or low-sodium soups. Unsalted popcorn and pretzels. Low-salt or salt-free chips. The items listed above may not be a complete list of foods and beverages you can eat. Contact a dietitian for more information. What foods should I avoid? Vegetables Sauerkraut, pickled vegetables, and relishes. Olives. French fries. Onion rings. Regular canned vegetables (not low-sodium or reduced-sodium). Regular canned tomato sauce and paste (not low-sodium or reduced-sodium). Regular tomato and vegetable juice (not low-sodium or reduced-sodium). Frozen vegetables in  sauces. Grains   Instant hot cereals. Bread stuffing, pancake, and biscuit mixes. Croutons. Seasoned rice or pasta mixes. Noodle soup cups. Boxed or frozen macaroni and cheese. Regular salted crackers. Self-rising flour. Meats and other proteins Meat or fish that is salted, canned, smoked, spiced, or pickled. Precooked or cured meat, such as sausages or meat loaves. Bacon. Ham. Pepperoni. Hot dogs. Corned beef. Chipped beef. Salt pork. Jerky. Pickled herring. Anchovies and sardines. Regular canned tuna. Salted nuts. Dairy Processed cheese and cheese spreads. Hard cheeses. Cheese curds. Blue cheese. Feta cheese. String cheese. Regular cottage cheese. Buttermilk. Canned milk. Fats and oils Salted butter. Regular margarine. Ghee. Bacon fat. Seasonings and condiments Onion salt, garlic salt, seasoned salt, table salt, and sea salt. Canned and packaged gravies. Worcestershire sauce. Tartar sauce. Barbecue sauce. Teriyaki sauce. Soy sauce, including reduced-sodium. Steak sauce. Fish sauce. Oyster sauce. Cocktail sauce. Horseradish that you find on the shelf. Regular ketchup and mustard. Meat flavorings and tenderizers. Bouillon cubes. Hot sauce. Pre-made or packaged marinades. Pre-made or packaged taco seasonings. Relishes. Regular salad dressings. Salsa. Other foods Salted popcorn and pretzels. Corn chips and puffs. Potato and tortilla chips. Canned or dried soups. Pizza. Frozen entrees and pot pies. The items listed above may not be a complete list of foods and beverages you should avoid. Contact a dietitian for more information. Summary Eating less sodium can help lower your blood pressure, reduce swelling, and protect your heart, liver, and kidneys. Most people on this plan should limit their sodium intake to 1,500-2,000 mg (milligrams) of sodium each day. Canned, boxed, and frozen foods are high in sodium. Restaurant foods, fast foods, and pizza are also very high in sodium. You also get sodium by  adding salt to food. Try to cook at home, eat more fresh fruits and vegetables, and eat less fast food and canned, processed, or prepared foods. This information is not intended to replace advice given to you by your health care provider. Make sure you discuss any questions you have with your health care provider. Document Revised: 08/21/2019 Document Reviewed: 06/17/2019 [employer] Patient Education  2023 [employer]  

## 2022-01-08 NOTE — Progress Notes (Signed)
Subjective:  Patient ID: Heather Rogers, female    DOB: 1945/10/07  Age: 76 y.o. MRN: [id number]  CC: Diabetes   HPI Heather Rogers is a 76 y.o. year old female with a history of  type 2 diabetes mellitus (A1c 5.5), hypertension, gout, COVID-19 in 06/2020,, history of CVA, status post loop recorder placement in 2019, metastatic L breast cancer. She is accompanied by her daughter to today's visit.  Interval History: For her breast cancer she is currently on [name] and Femara.  Last seen by pulmonary for metastatic left pleural effusion and she currently has a drain in place.  Her daughter helps to change the dressing but she has had some irritation of the skin around the dressing and she itches a lot.  Ambulatory BP range 143/62- 162/64 and she endorses adherence with her antihypertensive. With regards to her diabetes mellitus A1c is 5.5 down from 6.4 previously and she endorses adherence with metformin.  She has no hypoglycemic side effects, no neuropathy but does have visual concerns. Not up to date on eye exams. She does have constipation and of note is on chronic iron tablets. Past Medical History:  Diagnosis Date   Diabetes ([place])    Hypertension    Pneumonia due to COVID-19 virus 07/25/2020   Stroke (cerebrum) [facility])    Stroke ([place]) 05/15/2018    Past Surgical History:  Procedure Laterality Date   CHEST TUBE INSERTION Left 09/07/2021   Procedure: CHEST TUBE INSERTION;  Surgeon: Candee Furbish, MD;  Location: [facility] ENDOSCOPY;  Service: Pulmonary;  Laterality: Left;   EYE SURGERY Left 04/06/2020   Implant placed   EYE SURGERY Right 03/30/2020   Implant placed    hysterectomy     LOOP RECORDER INSERTION N/A 05/20/2018   Procedure: LOOP RECORDER INSERTION;  Surgeon: Thompson Grayer, MD;  Location: [place] CV LAB;  Service: Cardiovascular;  Laterality: N/A;   TEE WITHOUT CARDIOVERSION N/A 05/20/2018   Procedure: TRANSESOPHAGEAL ECHOCARDIOGRAM (TEE);  Surgeon: Sueanne Margarita, MD;  Location: [facility] ENDOSCOPY;  Service: Cardiovascular;  Laterality: N/A;    Family History  Problem Relation Age of Onset   Hypertension Mother    Stroke Mother    Hypertension Father     Social History   Socioeconomic History   Marital status: Divorced    Spouse name: Not on file   Number of children: Not on file   Years of education: Not on file   Highest education level: Not on file  Occupational History   Not on file  Tobacco Use   Smoking status: Former    Packs/day: 1.00    Years: 15.00    Total pack years: 15.00    Types: Cigarettes    Quit date: 07/30/1985    Years since quitting: 36.4   Smokeless tobacco: Never  Substance and Sexual Activity   Alcohol use: Never   Drug use: Never   Sexual activity: Not Currently  Other Topics Concern   Not on file  Social History Narrative   Lives in [place].   Social Determinants of Health   Financial Resource Strain: Not on file  Food Insecurity: Not on file  Transportation Needs: Not on file  Physical Activity: Not on file  Stress: Not on file  Social Connections: Not on file    Allergies  Allergen Reactions   Sulfa Antibiotics     Itching     Outpatient Medications Prior to Visit  Medication Sig Dispense Refill   abemaciclib ([name])  50 MG tablet TAKE 1 TABLET BY MOUTH TWICE DAILY. SWALLOW TABLETS WHOLE. DO NOT CRUSH, CHEW OR SPLIT. 56 tablet 3   Accu-Chek FastClix Lancets MISC Check blood sugar fasting and before meals and again if pt feels bad (symptoms of hypo). 100 each 12   allopurinol (ZYLOPRIM) 100 MG tablet Take 1 tablet (100 mg total) by mouth daily. 90 tablet 1   amLODipine (NORVASC) 10 MG tablet Take 10 mg by mouth daily.     aspirin EC 81 MG tablet Take 81 mg by mouth daily. Swallow whole.     atorvastatin (LIPITOR) 80 MG tablet TAKE ONE TABLET BY MOUTH EVERY EVENING TO LOWER CHOLESTEROL 90 tablet 1   Blood Glucose Monitoring Suppl (ACCU-CHEK GUIDE) w/Device KIT 1 each by Does not  apply route as directed. 1 kit 0   carvedilol (COREG) 25 MG tablet Take 1 tablet (25 mg total) by mouth 2 (two) times daily with a meal. 180 tablet 1   cholecalciferol (VITAMIN D3) 25 MCG (1000 UNIT) tablet Take 1,000 Units by mouth daily.     ferrous sulfate 325 (65 FE) MG EC tablet Take 1 tablet (325 mg total) by mouth in the morning and at bedtime. 60 tablet 1   gabapentin (NEURONTIN) 100 MG capsule Take 1 capsule (100 mg total) by mouth 3 (three) times daily. 270 capsule 1   glucose blood (ACCU-CHEK GUIDE) test strip USE TO CHECK FASTING BLOOD SUGAR AND BEFORE MEALS AND AGAIN IF PATIENT FEELS BAD; SYMPTOMS OF HYPOGLYCEMIA 100 strip 11   letrozole (FEMARA) 2.5 MG tablet Take 1 tablet (2.5 mg total) by mouth daily. 90 tablet 3   loperamide (IMODIUM) 2 MG capsule Take 2 mg by mouth as needed for diarrhea or loose stools. Take 2 tabs (4 mg) with first loose stool, then 1 tab (2 mg) for each additional loose stool. Do not take more than 8 tabs (16 mg) in a 24 hour period.     Metamucil Fiber CHEW Chew 1 tablet by mouth daily.     Multiple Vitamin (MULTIVITAMIN WITH MINERALS) TABS tablet Take 1 tablet by mouth daily. 30 tablet 0   ondansetron (ZOFRAN) 8 MG tablet Take 1 tablet (8 mg total) by mouth every 8 (eight) hours as needed for nausea or vomiting. 30 tablet 2   prochlorperazine (COMPAZINE) 10 MG tablet Take 1 tablet (10 mg total) by mouth every 6 (six) hours as needed for nausea or vomiting. 30 tablet 2   hydrALAZINE (APRESOLINE) 25 MG tablet Take 1 tablet (25 mg total) by mouth 3 (three) times daily. 90 tablet 3   metFORMIN (GLUCOPHAGE) 500 MG tablet TAKE ONE TABLET BY MOUTH TWICE A DAY WITH A MEAL 180 tablet 1   No facility-administered medications prior to visit.     ROS Review of Systems  Constitutional:  Negative for activity change, appetite change and fatigue.  HENT:  Negative for congestion, sinus pressure and sore throat.   Eyes:  Positive for visual disturbance.  Respiratory:   Negative for cough, chest tightness, shortness of breath and wheezing.   Cardiovascular:  Negative for chest pain and palpitations.  Gastrointestinal:  Positive for constipation. Negative for abdominal distention and abdominal pain.  Endocrine: Negative for polydipsia.  Genitourinary:  Negative for dysuria and frequency.  Musculoskeletal:  Negative for arthralgias and back pain.  Skin:  Negative for rash.  Neurological:  Negative for tremors, light-headedness and numbness.  Hematological:  Does not bruise/bleed easily.  Psychiatric/Behavioral:  Negative for agitation and behavioral  problems.     Objective:  BP (!) 159/66   Pulse 76   Temp 98 F (36.7 C) (Oral)   Ht 5' 2"  (1.575 m)   Wt 136 lb 6.4 oz (61.9 kg)   SpO2 100%   BMI 24.95 kg/m      01/08/2022   10:24 AM 12/21/2021   10:50 AM 12/04/2021   10:57 AM  BP/Weight  Systolic BP 203 559 741  Diastolic BP 66 69 66  Wt. (Lbs) 136.4 140.5 138.9  BMI 24.95 kg/m2 25.7 kg/m2 25.41 kg/m2      Physical Exam Constitutional:      Appearance: She is well-developed.  Cardiovascular:     Rate and Rhythm: Normal rate.     Heart sounds: Normal heart sounds. No murmur heard. Pulmonary:     Effort: Pulmonary effort is normal.     Breath sounds: Normal breath sounds. No wheezing or rales.     Comments: Left lower lung base with drain in place and dressing above average.  Surrounding dermatitis Chest:     Chest wall: No tenderness.  Abdominal:     General: Bowel sounds are normal. There is no distension.     Palpations: Abdomen is soft. There is no mass.     Tenderness: There is no abdominal tenderness.  Musculoskeletal:        General: Normal range of motion.     Right lower leg: No edema.     Left lower leg: No edema.  Neurological:     Mental Status: She is alert and oriented to person, place, and time.  Psychiatric:        Mood and Affect: Mood normal.        Latest Ref Rng & Units 12/21/2021   10:32 AM 12/04/2021    10:34 AM 11/13/2021   10:03 AM  CMP  Glucose 70 - 99 mg/dL 111  101  118   BUN 8 - 23 mg/dL 16  24  24    Creatinine 0.44 - 1.00 mg/dL 1.40  1.41  1.45   Sodium 135 - 145 mmol/L 137  136  140   Potassium 3.5 - 5.1 mmol/L 4.5  4.3  4.1   Chloride 98 - 111 mmol/L 105  106  107   CO2 22 - 32 mmol/L 26  24  29    Calcium 8.9 - 10.3 mg/dL 10.2  10.0  10.3   Total Protein 6.5 - 8.1 g/dL 7.7  7.9  7.5   Total Bilirubin 0.3 - 1.2 mg/dL 0.3  0.3  0.3   Alkaline Phos 38 - 126 U/L 60  53  58   AST 15 - 41 U/L 19  18  19    ALT 0 - 44 U/L 15  16  17      Lipid Panel     Component Value Date/Time   CHOL 115 05/24/2021 1003   TRIG 105 05/24/2021 1003   HDL 38 (L) 05/24/2021 1003   CHOLHDL 3.0 05/24/2021 1003   CHOLHDL 4.7 05/16/2018 0420   VLDL 35 05/16/2018 0420   LDLCALC 57 05/24/2021 1003    CBC    Component Value Date/Time   WBC 4.2 12/21/2021 1032   WBC 6.6 09/07/2021 0358   RBC 4.32 12/21/2021 1032   HGB 10.3 (L) 12/21/2021 1032   HGB 10.3 (L) 05/24/2021 1003   HCT 32.0 (L) 12/21/2021 1032   HCT 32.4 (L) 05/24/2021 1003   PLT 272 12/21/2021 1032   PLT 345 05/24/2021 1003  MCV 74.1 (L) 12/21/2021 1032   MCV 72 (L) 05/24/2021 1003   MCH 23.8 (L) 12/21/2021 1032   MCHC 32.2 12/21/2021 1032   RDW 19.2 (H) 12/21/2021 1032   RDW 14.4 05/24/2021 1003   LYMPHSABS 1.3 12/21/2021 1032   LYMPHSABS 1.6 05/24/2021 1003   MONOABS 0.4 12/21/2021 1032   EOSABS 0.1 12/21/2021 1032   EOSABS 0.1 05/24/2021 1003   BASOSABS 0.1 12/21/2021 1032   BASOSABS 0.0 05/24/2021 1003    Lab Results  Component Value Date   HGBA1C 5.5 01/08/2022    Assessment & Plan:  1. Type 2 diabetes mellitus with diabetic mononeuropathy, without long-term current use of insulin (HCC) Controlled with A1c of 5.5 Decrease metformin from twice daily dosing to once daily dosing to prevent hypoglycemia - POCT glucose (manual entry) - POCT glycosylated hemoglobin (Hb A1C) - metFORMIN (GLUCOPHAGE) 500 MG tablet;  Take 1 tablet (500 mg total) by mouth daily with breakfast. TAKE ONE TABLET BY MOUTH TWICE A DAY WITH A MEAL  Dispense: 90 tablet; Refill: 1  2. Primary malignant neoplasm of breast with metastasis ([place]) Currently on [name] and letrozole Followed by oncology  3. Malignant pleural effusion Secondary to breast cancer Drain in place Followed by pulmonary  4. Hypertension associated with diabetes ([place]) Uncontrolled Increase hydralazine dose Counseled on blood pressure goal of less than 130/80, low-sodium, DASH diet, medication compliance, 150 minutes of moderate intensity exercise per week. Discussed medication compliance, adverse effects. - hydrALAZINE (APRESOLINE) 50 MG tablet; Take 1 tablet (50 mg total) by mouth 3 (three) times daily.  Dispense: 270 tablet; Refill: 1  5. Drug-induced constipation Secondary to ferrous sulfate Advised to continue with increased fiber intake, laxatives, water intake  6. Need for shingles vaccine - Zoster Vaccine Adjuvanted [facility]) injection; Inject 0.5 mLs into the muscle once for 1 dose.  Dispense: 0.5 mL; Refill: 1    Meds ordered this encounter  Medications   DISCONTD: Zoster Vaccine Adjuvanted [facility]) injection    Sig: Inject 0.5 mLs into the muscle once for 1 dose.    Dispense:  0.5 mL    Refill:  1   Zoster Vaccine Adjuvanted [facility]) injection    Sig: Inject 0.5 mLs into the muscle once for 1 dose.    Dispense:  0.5 mL    Refill:  1   hydrALAZINE (APRESOLINE) 50 MG tablet    Sig: Take 1 tablet (50 mg total) by mouth 3 (three) times daily.    Dispense:  270 tablet    Refill:  1    Dose increase   metFORMIN (GLUCOPHAGE) 500 MG tablet    Sig: Take 1 tablet (500 mg total) by mouth daily with breakfast. TAKE ONE TABLET BY MOUTH TWICE A DAY WITH A MEAL    Dispense:  90 tablet    Refill:  1    Dose decrease    Follow-up: Return in about 3 months (around 04/10/2022) for Chronic medical conditions.       Charlott Rakes, MD,  FAAFP. [facility] [place], [employer]   01/08/2022, 1:32 PM

## 2022-01-12 DIAGNOSIS — J910 Malignant pleural effusion: Secondary | ICD-10-CM | POA: Diagnosis not present

## 2022-01-12 DIAGNOSIS — C50919 Malignant neoplasm of unspecified site of unspecified female breast: Secondary | ICD-10-CM | POA: Diagnosis not present

## 2022-01-15 ENCOUNTER — Ambulatory Visit (HOSPITAL_COMMUNITY)
Admission: RE | Admit: 2022-01-15 | Discharge: 2022-01-15 | Disposition: A | Discharge status: Home / Self Care | Payer: Medicare HMO | Source: Ambulatory Visit | Attending: Hematology and Oncology | Admitting: Hematology and Oncology

## 2022-01-15 DIAGNOSIS — E041 Nontoxic single thyroid nodule: Secondary | ICD-10-CM | POA: Insufficient documentation

## 2022-01-15 DIAGNOSIS — R590 Localized enlarged lymph nodes: Secondary | ICD-10-CM | POA: Diagnosis not present

## 2022-01-15 DIAGNOSIS — I700 Atherosclerosis of aorta: Secondary | ICD-10-CM | POA: Insufficient documentation

## 2022-01-15 DIAGNOSIS — C50919 Malignant neoplasm of unspecified site of unspecified female breast: Secondary | ICD-10-CM | POA: Insufficient documentation

## 2022-01-15 DIAGNOSIS — C799 Secondary malignant neoplasm of unspecified site: Secondary | ICD-10-CM | POA: Diagnosis not present

## 2022-01-15 DIAGNOSIS — J90 Pleural effusion, not elsewhere classified: Secondary | ICD-10-CM | POA: Diagnosis not present

## 2022-01-15 DIAGNOSIS — R918 Other nonspecific abnormal finding of lung field: Secondary | ICD-10-CM | POA: Diagnosis not present

## 2022-01-18 ENCOUNTER — Inpatient Hospital Stay: Payer: Medicare HMO | Attending: Hematology and Oncology

## 2022-01-18 ENCOUNTER — Other Ambulatory Visit: Payer: Self-pay

## 2022-01-18 ENCOUNTER — Telehealth: Payer: Self-pay | Admitting: Pharmacist

## 2022-01-18 ENCOUNTER — Inpatient Hospital Stay (HOSPITAL_BASED_OUTPATIENT_CLINIC_OR_DEPARTMENT_OTHER): Payer: Medicare HMO | Admitting: Hematology and Oncology

## 2022-01-18 DIAGNOSIS — C50919 Malignant neoplasm of unspecified site of unspecified female breast: Secondary | ICD-10-CM

## 2022-01-18 DIAGNOSIS — Z79811 Long term (current) use of aromatase inhibitors: Secondary | ICD-10-CM | POA: Diagnosis not present

## 2022-01-18 DIAGNOSIS — Z7982 Long term (current) use of aspirin: Secondary | ICD-10-CM | POA: Insufficient documentation

## 2022-01-18 DIAGNOSIS — E1159 Type 2 diabetes mellitus with other circulatory complications: Secondary | ICD-10-CM | POA: Diagnosis not present

## 2022-01-18 DIAGNOSIS — I152 Hypertension secondary to endocrine disorders: Secondary | ICD-10-CM

## 2022-01-18 DIAGNOSIS — Z7984 Long term (current) use of oral hypoglycemic drugs: Secondary | ICD-10-CM | POA: Insufficient documentation

## 2022-01-18 DIAGNOSIS — Z79899 Other long term (current) drug therapy: Secondary | ICD-10-CM | POA: Diagnosis not present

## 2022-01-18 DIAGNOSIS — Z170 Estrogen receptor positive status [ER+]: Secondary | ICD-10-CM | POA: Insufficient documentation

## 2022-01-18 DIAGNOSIS — C50912 Malignant neoplasm of unspecified site of left female breast: Secondary | ICD-10-CM | POA: Insufficient documentation

## 2022-01-18 LAB — CBC WITH DIFFERENTIAL (CANCER CENTER ONLY)
Abs Immature Granulocytes: 0.01 10*3/uL (ref 0.00–0.07)
Basophils Absolute: 0.1 10*3/uL (ref 0.0–0.1)
Basophils Relative: 2 %
Eosinophils Absolute: 0.1 10*3/uL (ref 0.0–0.5)
Eosinophils Relative: 3 %
HCT: 31.3 % — ABNORMAL LOW (ref 36.0–46.0)
Hemoglobin: 9.9 g/dL — ABNORMAL LOW (ref 12.0–15.0)
Immature Granulocytes: 0 %
Lymphocytes Relative: 31 %
Lymphs Abs: 1.1 10*3/uL (ref 0.7–4.0)
MCH: 24 pg — ABNORMAL LOW (ref 26.0–34.0)
MCHC: 31.6 g/dL (ref 30.0–36.0)
MCV: 75.8 fL — ABNORMAL LOW (ref 80.0–100.0)
Monocytes Absolute: 0.3 10*3/uL (ref 0.1–1.0)
Monocytes Relative: 9 %
Neutro Abs: 1.9 10*3/uL (ref 1.7–7.7)
Neutrophils Relative %: 55 %
Platelet Count: 245 10*3/uL (ref 150–400)
RBC: 4.13 MIL/uL (ref 3.87–5.11)
RDW: 19.9 % — ABNORMAL HIGH (ref 11.5–15.5)
WBC Count: 3.4 10*3/uL — ABNORMAL LOW (ref 4.0–10.5)
nRBC: 0 % (ref 0.0–0.2)

## 2022-01-18 LAB — CMP (CANCER CENTER ONLY)
ALT: 14 U/L (ref 0–44)
AST: 18 U/L (ref 15–41)
Albumin: 4.2 g/dL (ref 3.5–5.0)
Alkaline Phosphatase: 56 U/L (ref 38–126)
Anion gap: 6 (ref 5–15)
BUN: 18 mg/dL (ref 8–23)
CO2: 26 mmol/L (ref 22–32)
Calcium: 10.1 mg/dL (ref 8.9–10.3)
Chloride: 107 mmol/L (ref 98–111)
Creatinine: 1.22 mg/dL — ABNORMAL HIGH (ref 0.44–1.00)
GFR, Estimated: 46 mL/min — ABNORMAL LOW (ref >60)
Glucose, Bld: 117 mg/dL — ABNORMAL HIGH (ref 70–99)
Potassium: 4.2 mmol/L (ref 3.5–5.1)
Sodium: 139 mmol/L (ref 135–145)
Total Bilirubin: 0.3 mg/dL (ref 0.3–1.2)
Total Protein: 7.7 g/dL (ref 6.5–8.1)

## 2022-01-18 NOTE — Assessment & Plan Note (Signed)
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  Hospitalization 09/02/2021-09/08/2021: Shortness of breath, large pleural effusion, thoracentesis, pleural fluid cytology: Metastatic adenocarcinoma breast origin ER 40%, PR 30%, Ki-67 5%, HER2 2+ by IHC, FISH negative ratio 1.43   Treatment plan: 1.  [name] with letrozole   01/15/22: Ct Chest: Large ulcerated breast mass slightly decreased in size. Axill and left IM LN dec in size, Multiple pulmonary and pleural nodules smaller, unchaged sternal met, and left 3 and 4 ribs  Based on response to treatment, we will continue the same treatment  

## 2022-02-05 DIAGNOSIS — H524 Presbyopia: Secondary | ICD-10-CM | POA: Diagnosis not present

## 2022-02-05 DIAGNOSIS — Z961 Presence of intraocular lens: Secondary | ICD-10-CM | POA: Diagnosis not present

## 2022-02-05 DIAGNOSIS — Z0100 Encounter for examination of eyes and vision without abnormal findings: Secondary | ICD-10-CM | POA: Diagnosis not present

## 2022-02-05 DIAGNOSIS — H26492 Other secondary cataract, left eye: Secondary | ICD-10-CM | POA: Diagnosis not present

## 2022-02-05 DIAGNOSIS — E119 Type 2 diabetes mellitus without complications: Secondary | ICD-10-CM | POA: Diagnosis not present

## 2022-02-05 DIAGNOSIS — Z7984 Long term (current) use of oral hypoglycemic drugs: Secondary | ICD-10-CM | POA: Diagnosis not present

## 2022-02-15 ENCOUNTER — Other Ambulatory Visit: Payer: Self-pay

## 2022-02-15 ENCOUNTER — Inpatient Hospital Stay: Payer: Medicare HMO | Admitting: Pharmacist

## 2022-02-15 ENCOUNTER — Inpatient Hospital Stay: Payer: Medicare HMO

## 2022-02-15 VITALS — BP 162/62 | HR 75 | Temp 97.5°F | Resp 18 | Ht 62.0 in | Wt 137.9 lb

## 2022-02-15 DIAGNOSIS — J90 Pleural effusion, not elsewhere classified: Secondary | ICD-10-CM | POA: Diagnosis not present

## 2022-02-15 DIAGNOSIS — R590 Localized enlarged lymph nodes: Secondary | ICD-10-CM | POA: Diagnosis present

## 2022-02-15 DIAGNOSIS — E1141 Type 2 diabetes mellitus with diabetic mononeuropathy: Secondary | ICD-10-CM | POA: Diagnosis not present

## 2022-02-15 DIAGNOSIS — C50912 Malignant neoplasm of unspecified site of left female breast: Secondary | ICD-10-CM | POA: Insufficient documentation

## 2022-02-15 DIAGNOSIS — I152 Hypertension secondary to endocrine disorders: Secondary | ICD-10-CM | POA: Diagnosis not present

## 2022-02-15 DIAGNOSIS — Z8701 Personal history of pneumonia (recurrent): Secondary | ICD-10-CM | POA: Diagnosis not present

## 2022-02-15 DIAGNOSIS — D638 Anemia in other chronic diseases classified elsewhere: Secondary | ICD-10-CM | POA: Diagnosis not present

## 2022-02-15 DIAGNOSIS — I10 Essential (primary) hypertension: Secondary | ICD-10-CM | POA: Diagnosis not present

## 2022-02-15 DIAGNOSIS — C771 Secondary and unspecified malignant neoplasm of intrathoracic lymph nodes: Secondary | ICD-10-CM | POA: Diagnosis present

## 2022-02-15 DIAGNOSIS — Z8673 Personal history of transient ischemic attack (TIA), and cerebral infarction without residual deficits: Secondary | ICD-10-CM | POA: Diagnosis not present

## 2022-02-15 DIAGNOSIS — I69351 Hemiplegia and hemiparesis following cerebral infarction affecting right dominant side: Secondary | ICD-10-CM | POA: Diagnosis not present

## 2022-02-15 DIAGNOSIS — J9601 Acute respiratory failure with hypoxia: Secondary | ICD-10-CM | POA: Diagnosis present

## 2022-02-15 DIAGNOSIS — Z978 Presence of other specified devices: Secondary | ICD-10-CM | POA: Diagnosis not present

## 2022-02-15 DIAGNOSIS — Z8249 Family history of ischemic heart disease and other diseases of the circulatory system: Secondary | ICD-10-CM | POA: Diagnosis not present

## 2022-02-15 DIAGNOSIS — R9431 Abnormal electrocardiogram [ECG] [EKG]: Secondary | ICD-10-CM | POA: Diagnosis not present

## 2022-02-15 DIAGNOSIS — I700 Atherosclerosis of aorta: Secondary | ICD-10-CM | POA: Diagnosis not present

## 2022-02-15 DIAGNOSIS — C50919 Malignant neoplasm of unspecified site of unspecified female breast: Secondary | ICD-10-CM | POA: Diagnosis not present

## 2022-02-15 DIAGNOSIS — R1110 Vomiting, unspecified: Secondary | ICD-10-CM | POA: Diagnosis not present

## 2022-02-15 DIAGNOSIS — Z8616 Personal history of COVID-19: Secondary | ICD-10-CM | POA: Diagnosis not present

## 2022-02-15 DIAGNOSIS — R112 Nausea with vomiting, unspecified: Secondary | ICD-10-CM | POA: Diagnosis not present

## 2022-02-15 DIAGNOSIS — E785 Hyperlipidemia, unspecified: Secondary | ICD-10-CM | POA: Diagnosis present

## 2022-02-15 DIAGNOSIS — R0602 Shortness of breath: Secondary | ICD-10-CM | POA: Diagnosis not present

## 2022-02-15 DIAGNOSIS — I3139 Other pericardial effusion (noninflammatory): Secondary | ICD-10-CM | POA: Diagnosis not present

## 2022-02-15 DIAGNOSIS — R55 Syncope and collapse: Secondary | ICD-10-CM | POA: Diagnosis not present

## 2022-02-15 DIAGNOSIS — R918 Other nonspecific abnormal finding of lung field: Secondary | ICD-10-CM | POA: Diagnosis not present

## 2022-02-15 DIAGNOSIS — I63532 Cerebral infarction due to unspecified occlusion or stenosis of left posterior cerebral artery: Secondary | ICD-10-CM | POA: Diagnosis not present

## 2022-02-15 DIAGNOSIS — N179 Acute kidney failure, unspecified: Secondary | ICD-10-CM | POA: Diagnosis present

## 2022-02-15 DIAGNOSIS — C801 Malignant (primary) neoplasm, unspecified: Secondary | ICD-10-CM | POA: Diagnosis not present

## 2022-02-15 DIAGNOSIS — E1122 Type 2 diabetes mellitus with diabetic chronic kidney disease: Secondary | ICD-10-CM | POA: Diagnosis present

## 2022-02-15 DIAGNOSIS — T85698A Other mechanical complication of other specified internal prosthetic devices, implants and grafts, initial encounter: Secondary | ICD-10-CM | POA: Diagnosis not present

## 2022-02-15 DIAGNOSIS — J929 Pleural plaque without asbestos: Secondary | ICD-10-CM | POA: Diagnosis not present

## 2022-02-15 DIAGNOSIS — J9811 Atelectasis: Secondary | ICD-10-CM | POA: Diagnosis present

## 2022-02-15 DIAGNOSIS — J189 Pneumonia, unspecified organism: Secondary | ICD-10-CM | POA: Diagnosis present

## 2022-02-15 DIAGNOSIS — B957 Other staphylococcus as the cause of diseases classified elsewhere: Secondary | ICD-10-CM | POA: Diagnosis present

## 2022-02-15 DIAGNOSIS — I129 Hypertensive chronic kidney disease with stage 1 through stage 4 chronic kidney disease, or unspecified chronic kidney disease: Secondary | ICD-10-CM | POA: Diagnosis present

## 2022-02-15 DIAGNOSIS — R509 Fever, unspecified: Secondary | ICD-10-CM | POA: Diagnosis present

## 2022-02-15 DIAGNOSIS — C7951 Secondary malignant neoplasm of bone: Secondary | ICD-10-CM | POA: Diagnosis present

## 2022-02-15 DIAGNOSIS — C7981 Secondary malignant neoplasm of breast: Secondary | ICD-10-CM | POA: Diagnosis not present

## 2022-02-15 DIAGNOSIS — E1159 Type 2 diabetes mellitus with other circulatory complications: Secondary | ICD-10-CM | POA: Diagnosis not present

## 2022-02-15 DIAGNOSIS — Z79811 Long term (current) use of aromatase inhibitors: Secondary | ICD-10-CM | POA: Diagnosis not present

## 2022-02-15 DIAGNOSIS — J869 Pyothorax without fistula: Secondary | ICD-10-CM | POA: Diagnosis present

## 2022-02-15 DIAGNOSIS — N189 Chronic kidney disease, unspecified: Secondary | ICD-10-CM | POA: Diagnosis not present

## 2022-02-15 DIAGNOSIS — Z853 Personal history of malignant neoplasm of breast: Secondary | ICD-10-CM | POA: Diagnosis not present

## 2022-02-15 DIAGNOSIS — Z20822 Contact with and (suspected) exposure to covid-19: Secondary | ICD-10-CM | POA: Diagnosis present

## 2022-02-15 DIAGNOSIS — D631 Anemia in chronic kidney disease: Secondary | ICD-10-CM | POA: Diagnosis present

## 2022-02-15 DIAGNOSIS — M47812 Spondylosis without myelopathy or radiculopathy, cervical region: Secondary | ICD-10-CM | POA: Diagnosis not present

## 2022-02-15 DIAGNOSIS — J910 Malignant pleural effusion: Secondary | ICD-10-CM | POA: Diagnosis not present

## 2022-02-15 DIAGNOSIS — N281 Cyst of kidney, acquired: Secondary | ICD-10-CM | POA: Diagnosis present

## 2022-02-15 DIAGNOSIS — N1832 Chronic kidney disease, stage 3b: Secondary | ICD-10-CM | POA: Diagnosis present

## 2022-02-15 DIAGNOSIS — E1169 Type 2 diabetes mellitus with other specified complication: Secondary | ICD-10-CM | POA: Diagnosis present

## 2022-02-15 DIAGNOSIS — Z823 Family history of stroke: Secondary | ICD-10-CM | POA: Diagnosis not present

## 2022-02-15 LAB — CBC WITH DIFFERENTIAL (CANCER CENTER ONLY)
Abs Immature Granulocytes: 0.01 10*3/uL (ref 0.00–0.07)
Basophils Absolute: 0.1 10*3/uL (ref 0.0–0.1)
Basophils Relative: 1 %
Eosinophils Absolute: 0.1 10*3/uL (ref 0.0–0.5)
Eosinophils Relative: 2 %
HCT: 28.5 % — ABNORMAL LOW (ref 36.0–46.0)
Hemoglobin: 9.3 g/dL — ABNORMAL LOW (ref 12.0–15.0)
Immature Granulocytes: 0 %
Lymphocytes Relative: 23 %
Lymphs Abs: 1.1 10*3/uL (ref 0.7–4.0)
MCH: 25.9 pg — ABNORMAL LOW (ref 26.0–34.0)
MCHC: 32.6 g/dL (ref 30.0–36.0)
MCV: 79.4 fL — ABNORMAL LOW (ref 80.0–100.0)
Monocytes Absolute: 0.5 10*3/uL (ref 0.1–1.0)
Monocytes Relative: 10 %
Neutro Abs: 2.9 10*3/uL (ref 1.7–7.7)
Neutrophils Relative %: 64 %
Platelet Count: 269 10*3/uL (ref 150–400)
RBC: 3.59 MIL/uL — ABNORMAL LOW (ref 3.87–5.11)
RDW: 16.8 % — ABNORMAL HIGH (ref 11.5–15.5)
WBC Count: 4.6 10*3/uL (ref 4.0–10.5)
nRBC: 0 % (ref 0.0–0.2)

## 2022-02-15 LAB — CMP (CANCER CENTER ONLY)
ALT: 12 U/L (ref 0–44)
AST: 16 U/L (ref 15–41)
Albumin: 4 g/dL (ref 3.5–5.0)
Alkaline Phosphatase: 59 U/L (ref 38–126)
Anion gap: 5 (ref 5–15)
BUN: 20 mg/dL (ref 8–23)
CO2: 28 mmol/L (ref 22–32)
Calcium: 10 mg/dL (ref 8.9–10.3)
Chloride: 106 mmol/L (ref 98–111)
Creatinine: 1.48 mg/dL — ABNORMAL HIGH (ref 0.44–1.00)
GFR, Estimated: 36 mL/min — ABNORMAL LOW (ref >60)
Glucose, Bld: 95 mg/dL (ref 70–99)
Potassium: 4.3 mmol/L (ref 3.5–5.1)
Sodium: 139 mmol/L (ref 135–145)
Total Bilirubin: 0.3 mg/dL (ref 0.3–1.2)
Total Protein: 7.2 g/dL (ref 6.5–8.1)

## 2022-02-15 MED ORDER — ABEMACICLIB 100 MG PO TABS: 100.0000 mg | ORAL_TABLET | Freq: Two times a day (BID) | ORAL | 3 refills | Status: DC

## 2022-02-15 NOTE — Progress Notes (Signed)
[place]       Telephone: [phone]?Fax: [phone]   Oncology Clinical Pharmacist Practitioner Progress Note  Heather Rogers was contacted via in-person to discuss her chemotherapy regimen for abemaciclib which they receive under the care of Dr. Nicholas Lose.   Current treatment regimen and start date Abemaciclib (10/08/21) Letrozole (09/14/21)   Interval History She continues on abemaciclib 50 mg by mouth every 12 hours on days 1 to 28 of a 28-day cycle. This is being given  in combination with letrozole . Therapy is planned to continue until disease progression or unacceptable toxicity.  Response to Therapy Ms. Cinnamon was seen today by clinical pharmacy as a follow-up to her abemaciclib management.  She is here today with her daughter.  Was last seen by clinical pharmacy on 12/21/21 and Dr. Lindi Adie on 01/18/2022.  She had restaging scans on 01/15/22 and Dr. Lindi Adie reviewed those scans at his last visit and we are continuing her current treatment regimen at this time.  Dr. Lindi Adie wanted restaging scans in 3 months and so we have placed those scans today which will be scheduled sometime around 04/17/22 she will see Dr. Lindi Adie with labs a few days after that.  Dr. Lindi Adie stated that they will closely monitor her thyroid mass.  Clinical pharmacy will see her again tentatively on 06/15/22.  She is doing quite well.  She continues to have every other day nausea which she is taking ondansetron as needed with good results.  Her daughter [name] continues to drain her PleurX catheter approximately every 4 days.  She states that she usually drains about 200 mL of fluid but today since it has been a little longer since the last drainage, she approximates 400 mL being drained.  At her last visit with Dr. Verlee Monte from pulmonology, he stated that she could be seen as needed.  He does report today continued constipation which she has been using MiraLAX for.  We did discuss that she could try  senna-S twice daily.  She is reporting no other side effects at this time.  Ms. Lavis is interested in increasing her abemaciclib dose to 100 mg every 12 hours.  A new prescription has been sent to [place] with this dose increase and Ms. Robison knows to continue to take 50 mg every 12 hours until her current prescription is exhausted.  Ms. Serafin did recently see Dr. Margarita Rana, who manages her other comorbidities such as diabetes and hypertension.  Her hydralazine dose was increased at that time on 01/08/22 and her metformin was decreased.  These updates have been made to her active med list.  Her serum creatinine continues to be slightly elevated.  It has increased from her last visit.  We did discuss the importance of drinking plenty of fluids and we will continue to monitor. Labs, vitals, treatment parameters, and manufacturer guidelines assessing toxicity were reviewed with [name] today. Based on these values, patient is in agreement to continue abemaciclib therapy at this time.  Allergies Allergies  Allergen Reactions   Sulfa Antibiotics     Itching     Vitals    01/18/2022   10:28 AM 01/08/2022   10:24 AM 12/21/2021   10:50 AM  Vitals with BMI  Height 5' 2"  5' 2"  5' 2"   Weight 137 lbs 3 oz 136 lbs 6 oz 140 lbs 8 oz  BMI 25.09 50.38 88.28  Systolic 003 491 791  Diastolic 66 66 69  Pulse 77  76 76     Laboratory Data    Latest Ref Rng & Units 02/15/2022   10:37 AM 01/18/2022   10:01 AM 12/21/2021   10:32 AM  CBC EXTENDED  WBC 4.0 - 10.5 K/uL 4.6  3.4  4.2   RBC 3.87 - 5.11 MIL/uL 3.59  4.13  4.32   Hemoglobin 12.0 - 15.0 g/dL 9.3  9.9  10.3   HCT 36.0 - 46.0 % 28.5  31.3  32.0   Platelets 150 - 400 K/uL 269  245  272   NEUT# 1.7 - 7.7 K/uL 2.9  1.9  2.3   Lymph# 0.7 - 4.0 K/uL 1.1  1.1  1.3        Latest Ref Rng & Units 01/18/2022   10:01 AM 12/21/2021   10:32 AM 12/04/2021   10:34 AM  CMP  Glucose 70 - 99 mg/dL 117  111  101   BUN 8 - 23 mg/dL  18  16  24    Creatinine 0.44 - 1.00 mg/dL 1.22  1.40  1.41   Sodium 135 - 145 mmol/L 139  137  136   Potassium 3.5 - 5.1 mmol/L 4.2  4.5  4.3   Chloride 98 - 111 mmol/L 107  105  106   CO2 22 - 32 mmol/L 26  26  24    Calcium 8.9 - 10.3 mg/dL 10.1  10.2  10.0   Total Protein 6.5 - 8.1 g/dL 7.7  7.7  7.9   Total Bilirubin 0.3 - 1.2 mg/dL 0.3  0.3  0.3   Alkaline Phos 38 - 126 U/L 56  60  53   AST 15 - 41 U/L 18  19  18    ALT 0 - 44 U/L 14  15  16      Lab Results  Component Value Date   MG 2.5 (H) 09/07/2021   MG 2.6 (H) 09/03/2021   MG 2.5 (H) 07/27/2020    Adverse Effects Assessment Serum creatinine elevation: Continue to drink plenty of fluids and monitor urine output.  We will continue to monitor.  Nausea: Continue ondansetron as needed Constipation: Continue MiraLAX daily, may try senna-S every 12 hours as needed.  Discussed with Ms. Lanius and her daughter that because we will be increasing the dose of abemaciclib, it will be important to monitor for diarrhea and to stop MiraLAX and senna-S if loose stools were to start.  Adherence Assessment EZRIE BUNYAN reports missing 0 doses over the past 4 weeks.   Reason for missed dose: N/A Patient was re-educated on importance of adherence.   Access Assessment RESHMA HOEY is currently receiving her abemaciclib through [employer] concerns: None  Medication Reconciliation The patient's medication list was reviewed today with the patient?  Yes New medications or herbal supplements have recently been started?  Yes, taking MiraLAX now.  Updated on med list. Any medications have been discontinued?  Yes, no longer taking Metamucil.  Updated on med list. The medication list was updated and reconciled based on the patient's most recent medication list in the electronic medical record (EMR) including herbal products and OTC medications.   Medications Current Outpatient Medications  Medication Sig  Dispense Refill   abemaciclib ([name]) 50 MG tablet TAKE 1 TABLET BY MOUTH TWICE DAILY. SWALLOW TABLETS WHOLE. DO NOT CRUSH, CHEW OR SPLIT. 56 tablet 3   Accu-Chek FastClix Lancets MISC Check blood sugar fasting and before meals and again if pt feels bad (symptoms of hypo). 100  each 12   allopurinol (ZYLOPRIM) 100 MG tablet Take 1 tablet (100 mg total) by mouth daily. 90 tablet 1   amLODipine (NORVASC) 10 MG tablet Take 10 mg by mouth daily.     aspirin EC 81 MG tablet Take 81 mg by mouth daily. Swallow whole.     atorvastatin (LIPITOR) 80 MG tablet TAKE ONE TABLET BY MOUTH EVERY EVENING TO LOWER CHOLESTEROL 90 tablet 1   Blood Glucose Monitoring Suppl (ACCU-CHEK GUIDE) w/Device KIT 1 each by Does not apply route as directed. 1 kit 0   carvedilol (COREG) 25 MG tablet Take 1 tablet (25 mg total) by mouth 2 (two) times daily with a meal. 180 tablet 1   cholecalciferol (VITAMIN D3) 25 MCG (1000 UNIT) tablet Take 1,000 Units by mouth daily.     ferrous sulfate 325 (65 FE) MG EC tablet Take 1 tablet (325 mg total) by mouth in the morning and at bedtime. 60 tablet 1   gabapentin (NEURONTIN) 100 MG capsule Take 1 capsule (100 mg total) by mouth 3 (three) times daily. 270 capsule 1   glucose blood (ACCU-CHEK GUIDE) test strip USE TO CHECK FASTING BLOOD SUGAR AND BEFORE MEALS AND AGAIN IF PATIENT FEELS BAD; SYMPTOMS OF HYPOGLYCEMIA 100 strip 11   hydrALAZINE (APRESOLINE) 50 MG tablet Take 1 tablet (50 mg total) by mouth 3 (three) times daily. 270 tablet 1   letrozole (FEMARA) 2.5 MG tablet Take 1 tablet (2.5 mg total) by mouth daily. 90 tablet 3   loperamide (IMODIUM) 2 MG capsule Take 2 mg by mouth as needed for diarrhea or loose stools. Take 2 tabs (4 mg) with first loose stool, then 1 tab (2 mg) for each additional loose stool. Do not take more than 8 tabs (16 mg) in a 24 hour period.     Metamucil Fiber CHEW Chew 1 tablet by mouth daily.     metFORMIN (GLUCOPHAGE) 500 MG tablet Take 1 tablet (500 mg  total) by mouth daily with breakfast. TAKE ONE TABLET BY MOUTH TWICE A DAY WITH A MEAL 90 tablet 1   Multiple Vitamin (MULTIVITAMIN WITH MINERALS) TABS tablet Take 1 tablet by mouth daily. 30 tablet 0   ondansetron (ZOFRAN) 8 MG tablet Take 1 tablet (8 mg total) by mouth every 8 (eight) hours as needed for nausea or vomiting. 30 tablet 2   prochlorperazine (COMPAZINE) 10 MG tablet Take 1 tablet (10 mg total) by mouth every 6 (six) hours as needed for nausea or vomiting. 30 tablet 2   No current facility-administered medications for this visit.    Drug-Drug Interactions (DDIs) DDIs were evaluated?  Yes Significant DDIs?  No The patient was instructed to speak with their health care provider and/or the oral chemotherapy pharmacist before starting any new drug, including prescription or over the counter, natural / herbal products, or vitamins.  Supportive Care Diarrhea: we reviewed that diarrhea is common with abemaciclib and confirmed that she does have loperamide (Imodium) at home.  We reviewed how to take this medication PRN Neutropenia: we discussed the importance of having a thermometer and what the Centers for Disease Control and Prevention (CDC) considers a fever which is 100.71F (38C) or higher.  Gave patient 24/7 triage line to call if any fevers or symptoms ILD/Pneumonitis: we reviewed potential symptoms including cough, shortness, and fatigue.  VTE: reviewed signs of DVT such as leg swelling, redness, pain, or tenderness and signs of PE such as shortness of breath, rapid or irregular heartbeat, cough, chest pain,  or lightheadedness.  Currently on daily aspirin and has history of CVA. Reviewed to take the medication every 12 hours (with food sometimes can be easier on the stomach) and to take it at the same time every day.   Dosing Assessment Hepatic adjustments needed?  No Renal adjustments needed?  No Toxicity adjustments needed?  No The current dosing regimen is appropriate to  continue at this time.  As above, increasing to abemaciclib 100 mg every 12 hours  Follow-Up Plan Increase abemaciclib to 100 mg by mouth every 12 hours.  New prescription sent and will start this prescription once the 50 mg tablets have been exhausted. Continue letrozole 2.5 mg by mouth daily She will continue to follow with Dr. Smitty Pluck office for her diabetes and blood pressure management.  Next appointment is tentatively 05/03/22 Restaging scans ordered for 04/17/22.  She will have a follow-up appointment with labs with Dr. Lindi Adie on 04/20/22 Labs, pharmacy clinic visit, on 06/15/22 Continue ondansetron for nausea as needed.  Also has prochlorperazine as needed. She will continue MiraLAX daily for constipation and may try senna-S.  Since she is increasing the dose of abemaciclib, she may start having loose stools.  If this should happen she should stop MiraLAX and senna-S and start loperamide as needed.  We reviewed these instructions again today. She will follow-up with Dr. Verlee Monte and pulmonology as needed regarding her Pleurx catheter  [name] participated in the discussion, expressed understanding, and voiced agreement with the above plan. All questions were answered to her satisfaction. The patient was advised to contact the clinic at [phone] with any questions or concerns prior to her return visit.   I spent 30 minutes assessing and educating the patient.  Raina Mina, RPH-CPP, 02/15/2022  10:56 AM   **Disclaimer: This note was dictated with voice recognition software. Similar sounding words can inadvertently be transcribed and this note may contain transcription errors which may not have been corrected upon publication of note.**

## 2022-02-16 ENCOUNTER — Telehealth: Payer: Self-pay | Admitting: Hematology and Oncology

## 2022-02-16 NOTE — Telephone Encounter (Signed)
Scheduled appointment per 7/20 los. Talked with the patients daughter and she is aware of the upcoming appointments.

## 2022-02-18 ENCOUNTER — Encounter: Payer: Self-pay | Admitting: Hematology and Oncology

## 2022-02-18 ENCOUNTER — Emergency Department (HOSPITAL_COMMUNITY): Payer: Medicare HMO

## 2022-02-18 ENCOUNTER — Inpatient Hospital Stay (HOSPITAL_COMMUNITY)
Admission: EM | Admit: 2022-02-18 | Discharge: 2022-03-01 | DRG: 597 | Disposition: A | Discharge status: Home Health | Payer: Medicare HMO | Attending: Family Medicine | Admitting: Family Medicine

## 2022-02-18 ENCOUNTER — Encounter (HOSPITAL_COMMUNITY): Payer: Self-pay

## 2022-02-18 ENCOUNTER — Other Ambulatory Visit: Payer: Self-pay

## 2022-02-18 ENCOUNTER — Encounter: Payer: Self-pay | Admitting: Family Medicine

## 2022-02-18 DIAGNOSIS — N1832 Chronic kidney disease, stage 3b: Secondary | ICD-10-CM | POA: Diagnosis present

## 2022-02-18 DIAGNOSIS — N281 Cyst of kidney, acquired: Secondary | ICD-10-CM | POA: Diagnosis present

## 2022-02-18 DIAGNOSIS — Z823 Family history of stroke: Secondary | ICD-10-CM

## 2022-02-18 DIAGNOSIS — Z20822 Contact with and (suspected) exposure to covid-19: Secondary | ICD-10-CM | POA: Diagnosis present

## 2022-02-18 DIAGNOSIS — Z90710 Acquired absence of both cervix and uterus: Secondary | ICD-10-CM

## 2022-02-18 DIAGNOSIS — C50919 Malignant neoplasm of unspecified site of unspecified female breast: Secondary | ICD-10-CM | POA: Diagnosis present

## 2022-02-18 DIAGNOSIS — R509 Fever, unspecified: Secondary | ICD-10-CM

## 2022-02-18 DIAGNOSIS — J910 Malignant pleural effusion: Secondary | ICD-10-CM | POA: Diagnosis present

## 2022-02-18 DIAGNOSIS — Z853 Personal history of malignant neoplasm of breast: Secondary | ICD-10-CM | POA: Diagnosis not present

## 2022-02-18 DIAGNOSIS — R590 Localized enlarged lymph nodes: Secondary | ICD-10-CM | POA: Diagnosis present

## 2022-02-18 DIAGNOSIS — N179 Acute kidney failure, unspecified: Secondary | ICD-10-CM | POA: Diagnosis present

## 2022-02-18 DIAGNOSIS — J90 Pleural effusion, not elsewhere classified: Principal | ICD-10-CM

## 2022-02-18 DIAGNOSIS — Z79899 Other long term (current) drug therapy: Secondary | ICD-10-CM

## 2022-02-18 DIAGNOSIS — E1141 Type 2 diabetes mellitus with diabetic mononeuropathy: Secondary | ICD-10-CM | POA: Diagnosis not present

## 2022-02-18 DIAGNOSIS — E785 Hyperlipidemia, unspecified: Secondary | ICD-10-CM | POA: Diagnosis present

## 2022-02-18 DIAGNOSIS — E1169 Type 2 diabetes mellitus with other specified complication: Secondary | ICD-10-CM | POA: Diagnosis present

## 2022-02-18 DIAGNOSIS — Z8673 Personal history of transient ischemic attack (TIA), and cerebral infarction without residual deficits: Secondary | ICD-10-CM | POA: Diagnosis not present

## 2022-02-18 DIAGNOSIS — J189 Pneumonia, unspecified organism: Secondary | ICD-10-CM | POA: Diagnosis present

## 2022-02-18 DIAGNOSIS — I3139 Other pericardial effusion (noninflammatory): Secondary | ICD-10-CM | POA: Diagnosis not present

## 2022-02-18 DIAGNOSIS — Z7982 Long term (current) use of aspirin: Secondary | ICD-10-CM

## 2022-02-18 DIAGNOSIS — E1122 Type 2 diabetes mellitus with diabetic chronic kidney disease: Secondary | ICD-10-CM | POA: Diagnosis present

## 2022-02-18 DIAGNOSIS — D638 Anemia in other chronic diseases classified elsewhere: Secondary | ICD-10-CM | POA: Diagnosis present

## 2022-02-18 DIAGNOSIS — J9811 Atelectasis: Secondary | ICD-10-CM | POA: Diagnosis present

## 2022-02-18 DIAGNOSIS — J929 Pleural plaque without asbestos: Secondary | ICD-10-CM | POA: Diagnosis not present

## 2022-02-18 DIAGNOSIS — Z8616 Personal history of COVID-19: Secondary | ICD-10-CM | POA: Diagnosis not present

## 2022-02-18 DIAGNOSIS — Z8701 Personal history of pneumonia (recurrent): Secondary | ICD-10-CM

## 2022-02-18 DIAGNOSIS — J9601 Acute respiratory failure with hypoxia: Secondary | ICD-10-CM | POA: Diagnosis not present

## 2022-02-18 DIAGNOSIS — C7981 Secondary malignant neoplasm of breast: Secondary | ICD-10-CM

## 2022-02-18 DIAGNOSIS — C771 Secondary and unspecified malignant neoplasm of intrathoracic lymph nodes: Secondary | ICD-10-CM | POA: Diagnosis present

## 2022-02-18 DIAGNOSIS — I152 Hypertension secondary to endocrine disorders: Secondary | ICD-10-CM | POA: Diagnosis not present

## 2022-02-18 DIAGNOSIS — R0602 Shortness of breath: Secondary | ICD-10-CM | POA: Diagnosis not present

## 2022-02-18 DIAGNOSIS — J869 Pyothorax without fistula: Secondary | ICD-10-CM | POA: Diagnosis present

## 2022-02-18 DIAGNOSIS — I69351 Hemiplegia and hemiparesis following cerebral infarction affecting right dominant side: Secondary | ICD-10-CM

## 2022-02-18 DIAGNOSIS — B957 Other staphylococcus as the cause of diseases classified elsewhere: Secondary | ICD-10-CM | POA: Diagnosis present

## 2022-02-18 DIAGNOSIS — C50912 Malignant neoplasm of unspecified site of left female breast: Secondary | ICD-10-CM | POA: Diagnosis present

## 2022-02-18 DIAGNOSIS — R55 Syncope and collapse: Secondary | ICD-10-CM | POA: Diagnosis not present

## 2022-02-18 DIAGNOSIS — E1159 Type 2 diabetes mellitus with other circulatory complications: Secondary | ICD-10-CM | POA: Diagnosis not present

## 2022-02-18 DIAGNOSIS — C801 Malignant (primary) neoplasm, unspecified: Secondary | ICD-10-CM | POA: Diagnosis not present

## 2022-02-18 DIAGNOSIS — C7951 Secondary malignant neoplasm of bone: Secondary | ICD-10-CM | POA: Diagnosis present

## 2022-02-18 DIAGNOSIS — Z79811 Long term (current) use of aromatase inhibitors: Secondary | ICD-10-CM | POA: Diagnosis not present

## 2022-02-18 DIAGNOSIS — Z7984 Long term (current) use of oral hypoglycemic drugs: Secondary | ICD-10-CM

## 2022-02-18 DIAGNOSIS — R918 Other nonspecific abnormal finding of lung field: Secondary | ICD-10-CM | POA: Diagnosis not present

## 2022-02-18 DIAGNOSIS — I129 Hypertensive chronic kidney disease with stage 1 through stage 4 chronic kidney disease, or unspecified chronic kidney disease: Secondary | ICD-10-CM | POA: Diagnosis present

## 2022-02-18 DIAGNOSIS — I700 Atherosclerosis of aorta: Secondary | ICD-10-CM | POA: Diagnosis not present

## 2022-02-18 DIAGNOSIS — R9431 Abnormal electrocardiogram [ECG] [EKG]: Secondary | ICD-10-CM | POA: Diagnosis not present

## 2022-02-18 DIAGNOSIS — R1110 Vomiting, unspecified: Secondary | ICD-10-CM | POA: Diagnosis not present

## 2022-02-18 DIAGNOSIS — I63532 Cerebral infarction due to unspecified occlusion or stenosis of left posterior cerebral artery: Secondary | ICD-10-CM | POA: Diagnosis not present

## 2022-02-18 DIAGNOSIS — I10 Essential (primary) hypertension: Secondary | ICD-10-CM | POA: Diagnosis not present

## 2022-02-18 DIAGNOSIS — R112 Nausea with vomiting, unspecified: Secondary | ICD-10-CM | POA: Diagnosis not present

## 2022-02-18 DIAGNOSIS — Z882 Allergy status to sulfonamides status: Secondary | ICD-10-CM

## 2022-02-18 DIAGNOSIS — Z8249 Family history of ischemic heart disease and other diseases of the circulatory system: Secondary | ICD-10-CM | POA: Diagnosis not present

## 2022-02-18 DIAGNOSIS — D631 Anemia in chronic kidney disease: Secondary | ICD-10-CM | POA: Diagnosis present

## 2022-02-18 DIAGNOSIS — N189 Chronic kidney disease, unspecified: Secondary | ICD-10-CM | POA: Diagnosis not present

## 2022-02-18 DIAGNOSIS — M47812 Spondylosis without myelopathy or radiculopathy, cervical region: Secondary | ICD-10-CM | POA: Diagnosis not present

## 2022-02-18 DIAGNOSIS — Z87891 Personal history of nicotine dependence: Secondary | ICD-10-CM

## 2022-02-18 HISTORY — DX: Chronic kidney disease, stage 3b: N18.32

## 2022-02-18 LAB — URINALYSIS, ROUTINE W REFLEX MICROSCOPIC
Bacteria, UA: NONE SEEN
Bilirubin Urine: NEGATIVE
Glucose, UA: NEGATIVE mg/dL
Hgb urine dipstick: NEGATIVE
Ketones, ur: NEGATIVE mg/dL
Nitrite: NEGATIVE
Protein, ur: NEGATIVE mg/dL
Specific Gravity, Urine: 1.006 (ref 1.005–1.030)
pH: 5 (ref 5.0–8.0)

## 2022-02-18 LAB — SARS CORONAVIRUS 2 BY RT PCR: SARS Coronavirus 2 by RT PCR: NEGATIVE

## 2022-02-18 LAB — COMPREHENSIVE METABOLIC PANEL
ALT: 15 U/L (ref 0–44)
AST: 18 U/L (ref 15–41)
Albumin: 3.6 g/dL (ref 3.5–5.0)
Alkaline Phosphatase: 50 U/L (ref 38–126)
Anion gap: 7 (ref 5–15)
BUN: 28 mg/dL — ABNORMAL HIGH (ref 8–23)
CO2: 24 mmol/L (ref 22–32)
Calcium: 9.6 mg/dL (ref 8.9–10.3)
Chloride: 106 mmol/L (ref 98–111)
Creatinine, Ser: 1.49 mg/dL — ABNORMAL HIGH (ref 0.44–1.00)
GFR, Estimated: 36 mL/min — ABNORMAL LOW (ref >60)
Glucose, Bld: 160 mg/dL — ABNORMAL HIGH (ref 70–99)
Potassium: 4 mmol/L (ref 3.5–5.1)
Sodium: 137 mmol/L (ref 135–145)
Total Bilirubin: 0.3 mg/dL (ref 0.3–1.2)
Total Protein: 7.5 g/dL (ref 6.5–8.1)

## 2022-02-18 LAB — CBC WITH DIFFERENTIAL/PLATELET
Abs Immature Granulocytes: 0.02 10*3/uL (ref 0.00–0.07)
Basophils Absolute: 0 10*3/uL (ref 0.0–0.1)
Basophils Relative: 1 %
Eosinophils Absolute: 0 10*3/uL (ref 0.0–0.5)
Eosinophils Relative: 0 %
HCT: 29.3 % — ABNORMAL LOW (ref 36.0–46.0)
Hemoglobin: 9.5 g/dL — ABNORMAL LOW (ref 12.0–15.0)
Immature Granulocytes: 0 %
Lymphocytes Relative: 12 %
Lymphs Abs: 0.9 10*3/uL (ref 0.7–4.0)
MCH: 26 pg (ref 26.0–34.0)
MCHC: 32.4 g/dL (ref 30.0–36.0)
MCV: 80.3 fL (ref 80.0–100.0)
Monocytes Absolute: 0.8 10*3/uL (ref 0.1–1.0)
Monocytes Relative: 10 %
Neutro Abs: 6 10*3/uL (ref 1.7–7.7)
Neutrophils Relative %: 77 %
Platelets: 306 10*3/uL (ref 150–400)
RBC: 3.65 MIL/uL — ABNORMAL LOW (ref 3.87–5.11)
RDW: 16.2 % — ABNORMAL HIGH (ref 11.5–15.5)
WBC: 7.8 10*3/uL (ref 4.0–10.5)
nRBC: 0 % (ref 0.0–0.2)

## 2022-02-18 LAB — CBG MONITORING, ED
Glucose-Capillary: 140 mg/dL — ABNORMAL HIGH (ref 70–99)
Glucose-Capillary: 116 mg/dL — ABNORMAL HIGH (ref 70–99)
Glucose-Capillary: 124 mg/dL — ABNORMAL HIGH (ref 70–99)

## 2022-02-18 LAB — LACTIC ACID, PLASMA
Lactic Acid, Venous: 1.2 mmol/L (ref 0.5–1.9)
Lactic Acid, Venous: 1 mmol/L (ref 0.5–1.9)

## 2022-02-18 LAB — TROPONIN I (HIGH SENSITIVITY)
Troponin I (High Sensitivity): 7 ng/L (ref <18)
Troponin I (High Sensitivity): 7 ng/L (ref <18)

## 2022-02-18 MED ORDER — SODIUM CHLORIDE (PF) 0.9 % IJ SOLN
INTRAMUSCULAR | Status: AC
  Filled 2022-02-18: qty 50

## 2022-02-18 MED ORDER — HYDRALAZINE HCL 50 MG PO TABS
50.0000 mg | ORAL_TABLET | Freq: Three times a day (TID) | ORAL | Status: DC
  Administered 2022-02-19 – 2022-03-01 (×30): 50 mg via ORAL
  Filled 2022-02-18: qty 1
  Filled 2022-02-18: qty 2
  Filled 2022-02-18 (×3): qty 1
  Filled 2022-02-18: qty 2
  Filled 2022-02-18 (×24): qty 1

## 2022-02-18 MED ORDER — SODIUM CHLORIDE 0.9 % IV BOLUS
1000.0000 mL | Freq: Once | INTRAVENOUS | Status: AC
  Administered 2022-02-18: 1000 mL via INTRAVENOUS

## 2022-02-18 MED ORDER — SODIUM CHLORIDE 0.9% FLUSH
3.0000 mL | Freq: Two times a day (BID) | INTRAVENOUS | Status: DC
  Administered 2022-02-19 – 2022-03-01 (×20): 3 mL via INTRAVENOUS

## 2022-02-18 MED ORDER — HEPARIN SODIUM (PORCINE) 5000 UNIT/ML IJ SOLN
5000.0000 [IU] | Freq: Three times a day (TID) | INTRAMUSCULAR | Status: DC
Start: 2022-02-18 — End: 2022-03-01
  Administered 2022-02-18 – 2022-03-01 (×32): 5000 [IU] via SUBCUTANEOUS
  Filled 2022-02-18 (×33): qty 1

## 2022-02-18 MED ORDER — ONDANSETRON HCL 4 MG/2ML IJ SOLN
4.0000 mg | Freq: Four times a day (QID) | INTRAMUSCULAR | Status: DC | PRN
  Administered 2022-02-26: 4 mg via INTRAVENOUS
  Filled 2022-02-18: qty 2

## 2022-02-18 MED ORDER — CARVEDILOL 25 MG PO TABS
25.0000 mg | ORAL_TABLET | Freq: Two times a day (BID) | ORAL | Status: DC
  Administered 2022-02-19 – 2022-03-01 (×21): 25 mg via ORAL
  Filled 2022-02-18: qty 2
  Filled 2022-02-18 (×20): qty 1

## 2022-02-18 MED ORDER — GABAPENTIN 100 MG PO CAPS
100.0000 mg | ORAL_CAPSULE | Freq: Three times a day (TID) | ORAL | Status: DC
  Administered 2022-02-18 – 2022-03-01 (×31): 100 mg via ORAL
  Filled 2022-02-18 (×31): qty 1

## 2022-02-18 MED ORDER — ATORVASTATIN CALCIUM 40 MG PO TABS
80.0000 mg | ORAL_TABLET | Freq: Every evening | ORAL | Status: DC
  Administered 2022-02-18 – 2022-02-28 (×11): 80 mg via ORAL
  Filled 2022-02-18 (×11): qty 2

## 2022-02-18 MED ORDER — ONDANSETRON HCL 4 MG PO TABS: 4.0000 mg | ORAL_TABLET | Freq: Four times a day (QID) | ORAL | Status: DC | PRN

## 2022-02-18 MED ORDER — ASPIRIN 81 MG PO TBEC
81.0000 mg | DELAYED_RELEASE_TABLET | Freq: Every day | ORAL | Status: DC
  Administered 2022-02-19 – 2022-03-01 (×10): 81 mg via ORAL
  Filled 2022-02-18 (×10): qty 1

## 2022-02-18 MED ORDER — CEFEPIME HCL 2 G IV SOLR
2.0000 g | INTRAVENOUS | Status: DC
Start: 2022-02-19 — End: 2022-02-24
  Administered 2022-02-19 – 2022-02-23 (×5): 2 g via INTRAVENOUS
  Filled 2022-02-18 (×5): qty 12.5

## 2022-02-18 MED ORDER — AMLODIPINE BESYLATE 10 MG PO TABS
10.0000 mg | ORAL_TABLET | Freq: Every day | ORAL | Status: DC
  Administered 2022-02-19 – 2022-03-01 (×10): 10 mg via ORAL
  Filled 2022-02-18 (×8): qty 1
  Filled 2022-02-18: qty 2
  Filled 2022-02-18: qty 1

## 2022-02-18 MED ORDER — ACETAMINOPHEN 325 MG PO TABS
650.0000 mg | ORAL_TABLET | Freq: Four times a day (QID) | ORAL | Status: DC | PRN
  Administered 2022-02-19 – 2022-02-26 (×9): 650 mg via ORAL
  Filled 2022-02-18 (×8): qty 2

## 2022-02-18 MED ORDER — SENNOSIDES-DOCUSATE SODIUM 8.6-50 MG PO TABS: 1.0000 | ORAL_TABLET | Freq: Every evening | ORAL | Status: DC | PRN

## 2022-02-18 MED ORDER — VANCOMYCIN HCL IN DEXTROSE 1-5 GM/200ML-% IV SOLN
1000.0000 mg | Freq: Once | INTRAVENOUS | Status: AC
  Administered 2022-02-18: 1000 mg via INTRAVENOUS
  Filled 2022-02-18: qty 200

## 2022-02-18 MED ORDER — VANCOMYCIN HCL IN DEXTROSE 1-5 GM/200ML-% IV SOLN: 1000.0000 mg | INTRAVENOUS | Status: DC

## 2022-02-18 MED ORDER — IOHEXOL 350 MG/ML SOLN
75.0000 mL | Freq: Once | INTRAVENOUS | Status: AC | PRN
  Administered 2022-02-18: 75 mL via INTRAVENOUS

## 2022-02-18 MED ORDER — ACETAMINOPHEN 650 MG RE SUPP: 650.0000 mg | Freq: Four times a day (QID) | RECTAL | Status: DC | PRN

## 2022-02-18 MED ORDER — SODIUM CHLORIDE 0.9 % IV SOLN
2.0000 g | Freq: Once | INTRAVENOUS | Status: AC
  Administered 2022-02-18: 2 g via INTRAVENOUS
  Filled 2022-02-18: qty 12.5

## 2022-02-18 MED ORDER — POLYETHYLENE GLYCOL 3350 17 G PO PACK
17.0000 g | PACK | Freq: Every day | ORAL | Status: DC
  Administered 2022-02-20 – 2022-03-01 (×9): 17 g via ORAL
  Filled 2022-02-18 (×10): qty 1

## 2022-02-18 MED ORDER — IOHEXOL 300 MG/ML  SOLN: 100.0000 mL | Freq: Once | INTRAMUSCULAR | Status: DC | PRN

## 2022-02-18 MED ORDER — INSULIN ASPART 100 UNIT/ML IJ SOLN
0.0000 [IU] | Freq: Three times a day (TID) | INTRAMUSCULAR | Status: DC
  Administered 2022-02-19: 1 [IU] via SUBCUTANEOUS
  Administered 2022-02-19 – 2022-02-20 (×2): 3 [IU] via SUBCUTANEOUS
  Administered 2022-02-20: 2 [IU] via SUBCUTANEOUS
  Administered 2022-02-20: 1 [IU] via SUBCUTANEOUS
  Administered 2022-02-21 (×2): 2 [IU] via SUBCUTANEOUS
  Administered 2022-02-21: 1 [IU] via SUBCUTANEOUS
  Administered 2022-02-22 – 2022-02-23 (×4): 2 [IU] via SUBCUTANEOUS
  Administered 2022-02-23: 3 [IU] via SUBCUTANEOUS
  Administered 2022-02-23: 2 [IU] via SUBCUTANEOUS
  Administered 2022-02-24: 3 [IU] via SUBCUTANEOUS
  Administered 2022-02-24 (×2): 2 [IU] via SUBCUTANEOUS
  Administered 2022-02-25: 1 [IU] via SUBCUTANEOUS
  Administered 2022-02-25 (×2): 3 [IU] via SUBCUTANEOUS
  Administered 2022-02-26: 2 [IU] via SUBCUTANEOUS
  Administered 2022-02-26 – 2022-02-27 (×2): 1 [IU] via SUBCUTANEOUS
  Administered 2022-02-27: 3 [IU] via SUBCUTANEOUS
  Administered 2022-02-27 – 2022-02-28 (×2): 2 [IU] via SUBCUTANEOUS
  Administered 2022-02-28 – 2022-03-01 (×3): 1 [IU] via SUBCUTANEOUS
  Filled 2022-02-18: qty 0.09

## 2022-02-18 NOTE — Progress Notes (Signed)
A consult was received from an ED physician for vanc per pharmacy dosing.  The patient's profile has been reviewed for ht/wt/allergies/indication/available labs.   A one time order has been placed for vanc 1g.  Further antibiotics/pharmacy consults should be ordered by admitting physician if indicated.                       Thank you, Kara Mead 02/18/2022  3:26 PM

## 2022-02-18 NOTE — Assessment & Plan Note (Signed)
Continue atorvastatin

## 2022-02-18 NOTE — ED Notes (Signed)
During ortho VS, pt did well laying down. When ed staff went to sit pt up initially, pt stated that they felt pain. When initially standing and remained standing pt was SOB as well.

## 2022-02-18 NOTE — Assessment & Plan Note (Signed)
Metastatic left breast cancer with malignant left pleural effusion and osseous involvement.  Following with oncology, Dr. Lindi Adie, on active treatment with letrozole and [name]. -Hold [name] and letrozole for now with suspicion for active infection

## 2022-02-18 NOTE — Hospital Course (Signed)
Heather Rogers is a 76 y.o. female with medical history significant for metastatic left breast cancer (on active treatment with [name] and letrozole) with malignant left pleural effusion s/p Pleurx catheter placement 09/07/2021, history of CVA, CKD stage IIIb, anemia of chronic disease, T2DM, HTN, HLD who is admitted with enlarging left malignant pleural effusion with concern for superimposed pneumonia.

## 2022-02-18 NOTE — Assessment & Plan Note (Signed)
Patient reported syncopal event at home prior to ED arrival.  Suspect vasovagal syncope.  Orthostatic vitals were negative. -Monitor on telemetry -PT/OT eval

## 2022-02-18 NOTE — Assessment & Plan Note (Signed)
Per ED staff, patient hypoxic with SPO2 88-90% while at rest and down to 86% with ambulation.  Currently stable on 2 L O2 via [place].  Hypoxia secondary to enlarging pleural effusion. -Continue supplemental oxygen as needed as well as management as above

## 2022-02-18 NOTE — ED Triage Notes (Signed)
Patient states that she vomited x 1 this AM and then she "passed out." Patient was found lying on the floor. Patient also had a diarrheal stool x 1 this AM. Patient 's daughter reports that the patient had a fever of 101.0 this AM.  Patient has a left pleural drainage tube.

## 2022-02-18 NOTE — Assessment & Plan Note (Signed)
Resume home amlodipine, Coreg, hydralazine.

## 2022-02-18 NOTE — Assessment & Plan Note (Signed)
Patient with increased volume of known left malignant pleural effusion.  Has Pleurx catheter in place with increased output.  With new fevers and immunocompromise status there is concern for superimposed infection. -Continue empiric IV vancomycin and cefepime -Follow blood cultures, send pleural fluid culture

## 2022-02-18 NOTE — Assessment & Plan Note (Signed)
Hemoglobin stable at 9.5.

## 2022-02-18 NOTE — ED Provider Notes (Signed)
[facility] DEPT Provider Note   CSN: [id number] Arrival date & time: 02/18/22  1413     History  Chief Complaint  Patient presents with   Emesis   Fever   Loss of Consciousness    Heather Rogers is a 76 y.o. female.  Patient with a history of hypertension, diabetes, metastatic breast cancer on p.o. chemotherapy here with not feeling well since last night.  Daughter reports generalized aches and shortness of breath since last night.  Today patient had 1 episode of vomiting and found to have a fever of 101.  She also passed out this morning after taking her medications.  Does not think she hit her head she remembers feeling dizzy and lightheaded and She knows she was on the ground.  Denies any head, neck, back or chest pain.  Denies any abdominal pain.  She did have 1 episode of diarrhea this morning.  1 episode of vomiting.  Fever to 101 at home.  No travel or sick contacts. Daughter reports increased drainage from her left pleural catheter over the past couple days.  Normally drains 200 every 4 to 5 days but has been draining 400 mL in the past couple days  The history is provided by the patient and a relative.  Emesis Associated symptoms: cough, diarrhea and fever   Associated symptoms: no abdominal pain, no arthralgias, no headaches and no myalgias   Fever Associated symptoms: cough, diarrhea, nausea and vomiting   Associated symptoms: no chest pain, no dysuria, no headaches and no myalgias   Loss of Consciousness Associated symptoms: fever, nausea, vomiting and weakness   Associated symptoms: no chest pain and no headaches        Home Medications Prior to Admission medications   Medication Sig Start Date End Date Taking? Authorizing Provider  abemaciclib ([name]) 100 MG tablet Take 1 tablet (100 mg total) by mouth 2 (two) times daily. Swallow tablets whole. Do not chew, crush, or split tablets before swallowing. 02/15/22   Nicholas Lose,  MD  Accu-Chek FastClix Lancets MISC Check blood sugar fasting and before meals and again if pt feels bad (symptoms of hypo). 09/20/21   Charlott Rakes, MD  allopurinol (ZYLOPRIM) 100 MG tablet Take 1 tablet (100 mg total) by mouth daily. 09/27/21   Charlott Rakes, MD  amLODipine (NORVASC) 10 MG tablet Take 10 mg by mouth daily.    [provider]  aspirin EC 81 MG tablet Take 81 mg by mouth daily. Swallow whole.    [provider]  atorvastatin (LIPITOR) 80 MG tablet TAKE ONE TABLET BY MOUTH EVERY EVENING TO LOWER CHOLESTEROL 09/27/21   Charlott Rakes, MD  Blood Glucose Monitoring Suppl (ACCU-CHEK GUIDE) w/Device KIT 1 each by Does not apply route as directed. 08/25/18   Fulp, Cammie, MD  carvedilol (COREG) 25 MG tablet Take 1 tablet (25 mg total) by mouth 2 (two) times daily with a meal. 09/27/21   Charlott Rakes, MD  cholecalciferol (VITAMIN D3) 25 MCG (1000 UNIT) tablet Take 1,000 Units by mouth daily.    [provider]  ferrous sulfate 325 (65 FE) MG EC tablet Take 1 tablet (325 mg total) by mouth in the morning and at bedtime. 05/22/21   Mayers, Cari S, PA-C  gabapentin (NEURONTIN) 100 MG capsule Take 1 capsule (100 mg total) by mouth 3 (three) times daily. 09/27/21   Charlott Rakes, MD  glucose blood (ACCU-CHEK GUIDE) test strip USE TO CHECK FASTING BLOOD SUGAR AND BEFORE MEALS  AND AGAIN IF PATIENT FEELS BAD; SYMPTOMS OF HYPOGLYCEMIA 09/20/21   Charlott Rakes, MD  hydrALAZINE (APRESOLINE) 50 MG tablet Take 1 tablet (50 mg total) by mouth 3 (three) times daily. 01/08/22   Charlott Rakes, MD  letrozole (FEMARA) 2.5 MG tablet Take 1 tablet (2.5 mg total) by mouth daily. 12/15/21   Nicholas Lose, MD  loperamide (IMODIUM) 2 MG capsule Take 2 mg by mouth as needed for diarrhea or loose stools. Take 2 tabs (4 mg) with first loose stool, then 1 tab (2 mg) for each additional loose stool. Do not take more than 8 tabs (16 mg) in a 24 hour period. Patient not taking: Reported on  02/15/2022    Nicholas Lose, MD  metFORMIN (GLUCOPHAGE) 500 MG tablet Take 1 tablet (500 mg total) by mouth daily with breakfast. TAKE ONE TABLET BY MOUTH TWICE A DAY WITH A MEAL Patient taking differently: Take 500 mg by mouth daily with breakfast. 01/08/22   Charlott Rakes, MD  Multiple Vitamin (MULTIVITAMIN WITH MINERALS) TABS tablet Take 1 tablet by mouth daily. 09/09/21   Ghimire, Henreitta Leber, MD  ondansetron (ZOFRAN) 8 MG tablet Take 1 tablet (8 mg total) by mouth every 8 (eight) hours as needed for nausea or vomiting. 09/28/21   Nicholas Lose, MD  polyethylene glycol (MIRALAX / GLYCOLAX) 17 g packet Take 17 g by mouth daily.    Nicholas Lose, MD  prochlorperazine (COMPAZINE) 10 MG tablet Take 1 tablet (10 mg total) by mouth every 6 (six) hours as needed for nausea or vomiting. Patient not taking: Reported on 02/15/2022 09/28/21   Nicholas Lose, MD      Allergies    Sulfa antibiotics    Review of Systems   Review of Systems  Constitutional:  Positive for activity change, appetite change, fatigue and fever.  Respiratory:  Positive for cough.   Cardiovascular:  Positive for syncope. Negative for chest pain.  Gastrointestinal:  Positive for diarrhea, nausea and vomiting. Negative for abdominal pain.  Genitourinary:  Negative for dysuria, hematuria and urgency.  Musculoskeletal:  Negative for arthralgias, back pain and myalgias.  Neurological:  Positive for weakness. Negative for headaches.   all other systems are negative except as noted in the HPI and PMH.    Physical Exam Updated Vital Signs BP (!) 167/58 (BP Location: Right Arm)   Pulse 84   Temp 99.8 F (37.7 C) (Oral)   Resp 18   Ht 5' 2"  (1.575 m)   Wt 62.1 kg   SpO2 94%   BMI 25.06 kg/m  Physical Exam Vitals and nursing note reviewed.  Constitutional:      General: She is not in acute distress.    Appearance: She is well-developed. She is ill-appearing.  HENT:     Head: Normocephalic and atraumatic.     Mouth/Throat:      Pharynx: No oropharyngeal exudate.  Eyes:     Conjunctiva/sclera: Conjunctivae normal.     Pupils: Pupils are equal, round, and reactive to light.  Neck:     Comments: No meningismus. Cardiovascular:     Rate and Rhythm: Normal rate and regular rhythm.     Heart sounds: Normal heart sounds. No murmur heard. Pulmonary:     Effort: Pulmonary effort is normal. No respiratory distress.     Comments: Left pleural catheter in place appears clean Diminished breath sounds on the left Abdominal:     Palpations: Abdomen is soft.     Tenderness: There is no abdominal tenderness. There is  no guarding or rebound.  Musculoskeletal:        General: No tenderness. Normal range of motion.     Cervical back: Normal range of motion and neck supple.  Skin:    General: Skin is warm.  Neurological:     Mental Status: She is alert and oriented to person, place, and time.     Cranial Nerves: No cranial nerve deficit.     Motor: No abnormal muscle tone.     Coordination: Coordination normal.     Comments:  5/5 strength throughout. CN 2-12 intact.Equal grip strength.   Psychiatric:        Behavior: Behavior normal.     ED Results / Procedures / Treatments   Labs (all labs ordered are listed, but only abnormal results are displayed) Labs Reviewed  COMPREHENSIVE METABOLIC PANEL - Abnormal; Notable for the following components:      Result Value   Glucose, Bld 160 (*)    BUN 28 (*)    Creatinine, Ser 1.49 (*)    GFR, Estimated 36 (*)    All other components within normal limits  CBC WITH DIFFERENTIAL/PLATELET - Abnormal; Notable for the following components:   RBC 3.65 (*)    Hemoglobin 9.5 (*)    HCT 29.3 (*)    RDW 16.2 (*)    All other components within normal limits  URINALYSIS, ROUTINE W REFLEX MICROSCOPIC - Abnormal; Notable for the following components:   Leukocytes,Ua SMALL (*)    All other components within normal limits  CBG MONITORING, ED - Abnormal; Notable for the following  components:   Glucose-Capillary 140 (*)    All other components within normal limits  SARS CORONAVIRUS 2 BY RT PCR  CULTURE, BLOOD (ROUTINE X 2)  CULTURE, BLOOD (ROUTINE X 2)  URINE CULTURE  LACTIC ACID, PLASMA  LACTIC ACID, PLASMA  PROTIME-INR  TROPONIN I (HIGH SENSITIVITY)  TROPONIN I (HIGH SENSITIVITY)    EKG EKG Interpretation  Date/Time:  Sunday February 18 2022 14:26:58 EDT Ventricular Rate:  85 PR Interval:  176 QRS Duration: 93 QT Interval:  353 QTC Calculation: 420 R Axis:   18 Text Interpretation: Sinus rhythm Low voltage, precordial leads RSR' in V1 or V2, right VCD or RVH Consider anterior infarct Interpretation limited secondary to artifact Confirmed by Ezequiel Essex [phone]) on 02/18/2022 2:58:55 PM  Radiology CT Head Wo Contrast  Result Date: 02/18/2022 CLINICAL DATA:  History of metastatic breast cancer presents with vomiting and fever. Currently on oral chemotherapy. One episode emesis. EXAM: CT HEAD WITHOUT CONTRAST CT CERVICAL SPINE WITHOUT CONTRAST TECHNIQUE: Multidetector CT imaging of the head and cervical spine was performed following the standard protocol without intravenous contrast. Multiplanar CT image reconstructions of the cervical spine were also generated. RADIATION DOSE REDUCTION: This exam was performed according to the departmental dose-optimization program which includes automated exposure control, adjustment of the mA and/or kV according to patient size and/or use of iterative reconstruction technique. COMPARISON:  Head CT 05/15/2018 FINDINGS: CT HEAD FINDINGS Brain: Ventricles, cisterns and other CSF spaces are within normal. Mild chronic ischemic microvascular disease. Old left occipital infarct. No mass, mass effect or shift of midline structures. No acute hemorrhage. No definite acute infarction. Vascular: No hyperdense vessel or unexpected calcification. Skull: Normal. Negative for fracture or focal lesion. Sinuses/Orbits: No acute finding. Other:  None. CT CERVICAL SPINE FINDINGS Alignment: Normal. Skull base and vertebrae: Mild to moderate spondylosis throughout the cervical spine to include facet arthropathy and uncovertebral joint spurring. Atlantoaxial articulation  is unremarkable. Minimal bilateral neural foraminal narrowing at the C5-6 level and mild left-sided neural foraminal narrowing at the C6-7 level. No acute fracture. Soft tissues and spinal canal: Prevertebral soft tissues are normal. No significant canal stenosis. Disc levels: Disc space narrowing at the C5-6 and C6-7 levels and to lesser extent at the C4-5 and C7-T1 levels. Upper chest: No acute findings. Other: None. IMPRESSION: 1. No acute brain injury. 2. 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 3. No acute cervical spine injury. 4. Mild to moderate spondylosis throughout the cervical spine with multilevel disc disease and neural foraminal narrowing as described. Electronically Signed   By: Marin Olp M.D.   On: 02/18/2022 15:12   CT Cervical Spine Wo Contrast  Result Date: 02/18/2022 CLINICAL DATA:  History of metastatic breast cancer presents with vomiting and fever. Currently on oral chemotherapy. One episode emesis. EXAM: CT HEAD WITHOUT CONTRAST CT CERVICAL SPINE WITHOUT CONTRAST TECHNIQUE: Multidetector CT imaging of the head and cervical spine was performed following the standard protocol without intravenous contrast. Multiplanar CT image reconstructions of the cervical spine were also generated. RADIATION DOSE REDUCTION: This exam was performed according to the departmental dose-optimization program which includes automated exposure control, adjustment of the mA and/or kV according to patient size and/or use of iterative reconstruction technique. COMPARISON:  Head CT 05/15/2018 FINDINGS: CT HEAD FINDINGS Brain: Ventricles, cisterns and other CSF spaces are within normal. Mild chronic ischemic microvascular disease. Old left occipital  infarct. No mass, mass effect or shift of midline structures. No acute hemorrhage. No definite acute infarction. Vascular: No hyperdense vessel or unexpected calcification. Skull: Normal. Negative for fracture or focal lesion. Sinuses/Orbits: No acute finding. Other: None. CT CERVICAL SPINE FINDINGS Alignment: Normal. Skull base and vertebrae: Mild to moderate spondylosis throughout the cervical spine to include facet arthropathy and uncovertebral joint spurring. Atlantoaxial articulation is unremarkable. Minimal bilateral neural foraminal narrowing at the C5-6 level and mild left-sided neural foraminal narrowing at the C6-7 level. No acute fracture. Soft tissues and spinal canal: Prevertebral soft tissues are normal. No significant canal stenosis. Disc levels: Disc space narrowing at the C5-6 and C6-7 levels and to lesser extent at the C4-5 and C7-T1 levels. Upper chest: No acute findings. Other: None. IMPRESSION: 1. No acute brain injury. 2. 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 3. No acute cervical spine injury. 4. Mild to moderate spondylosis throughout the cervical spine with multilevel disc disease and neural foraminal narrowing as described. Electronically Signed   By: Marin Olp M.D.   On: 02/18/2022 15:12   DG Chest 2 View  Result Date: 02/18/2022 CLINICAL DATA:  fx/sob EXAM: CHEST - 2 VIEW COMPARISON:  Chest x-ray 09/07/2021, CT chest 01/15/2022 FINDINGS: The heart and mediastinal contours are unchanged. Aortic calcification. Left upper lobe hazy airspace opacity. No pulmonary edema. Interval increase in size of a possibly loculated, moderate volume left pleural effusion. No right pleural effusion. No pneumothorax. No acute osseous abnormality. IMPRESSION: 1. Interval increase in size of a possibly loculated, moderate volume left pleural effusion. 2. Left upper lobe hazy airspace opacity may represent combination of atelectasis versus infection/inflammation. 3.   Aortic Atherosclerosis (ICD10-I70.0). Electronically Signed   By: Iven Finn M.D.   On: 02/18/2022 15:07    Procedures Procedures    Medications Ordered in ED Medications  ceFEPIme (MAXIPIME) 1 g in sodium chloride 0.9 % 100 mL IVPB (has no administration in time range)    ED Course/ Medical Decision  Making/ A&P                           Medical Decision Making Amount and/or Complexity of Data Reviewed Labs: ordered. Decision-making details documented in ED Course. Radiology: ordered and independent interpretation performed. Decision-making details documented in ED Course. ECG/medicine tests: ordered and independent interpretation performed. Decision-making details documented in ED Course.  Risk Prescription drug management. Decision regarding hospitalization.   Patient with metastatic breast cancer here with nausea, vomiting, fever, syncope and increased drainage from her pleural catheter.  She has no hypoxia or increased work of breathing.  X-ray shows worsening left-sided pleural effusion.  Results reviewed and interpreted by me.  Given fever, septic work-up was pursued, antibiotics started after cultures obtained.  Concern for possible pneumonia.  EKG without acute ischemia, no Brugada, no prolonged QT Suspect likely vasovagal syncope.   CT head and C-spine are negative for traumatic injury.  Results reviewed and interpreted by me.  Patient with somewhat increased difficulty breathing and tachypnea.  O2 saturation to the high 80s and patient placed on 2 L nasal cannula.  Pleurx drainage catheter kit not available and the daughter went home to get this to drain some more fluid  CT scan is poor quality to rule out pulmonary embolism no obvious clot seen.  Does have enlarged left pleural effusion likely source of her shortness of breath and tachypnea.  Daughter is going to get drainage catheter kit.  Given patient's fever, tachypnea, worsening pleural effusion with nausea  vomiting and syncope we will plan admission. D/w Dr. Posey Pronto.        Final Clinical Impression(s) / ED Diagnoses Final diagnoses:  Pleural effusion  Nausea and vomiting, unspecified vomiting type  Fever, unspecified fever cause    Rx / DC Orders ED Discharge Orders     None         Page Lancon, Annie Main, MD 02/18/22 2319

## 2022-02-18 NOTE — ED Provider Triage Note (Signed)
Emergency Medicine Provider Triage Evaluation Note  Heather Rogers , a 76 y.o. female  was evaluated in triage.  Patient with a current history of metastatic breast cancer presenting today with vomiting and fever.  She is currently on oral chemotherapy.  She had 1 episode of emesis and reportedly lost consciousness.  Not visualized by daughter but patient woke up on the floor.  Family member reports fever around 101 at home.  She has been indwelling Foley and daughter reports no concerns when she drains it.  Review of Systems  Positive: Nausea, emesis, syncope?,  Fever Negative:   Physical Exam  There were no vitals taken for this visit. Gen:   Awake, no distress   Resp:  Normal effort  MSK:   Moves extremities without difficulty  Other:  Well-appearing, not tachycardic.  Borderline fever, will need rectal temperature.  Medical Decision Making  Medically screening exam initiated at 2:26 PM.  Appropriate orders placed.  Heather Rogers was informed that the remainder of the evaluation will be completed by another provider, this initial triage assessment does not replace that evaluation, and the importance of remaining in the ED until their evaluation is complete.     Rhae Hammock, PA-C 02/18/22 1428

## 2022-02-18 NOTE — Assessment & Plan Note (Signed)
Renal function at baseline.  Continue to monitor. ?

## 2022-02-18 NOTE — H&P (Signed)
History and Physical    Heather Rogers EXH:[id number] DOB: 02/23/1946 DOA: 02/18/2022  PCP: Charlott Rakes, MD  Patient coming from: Home  I have personally briefly reviewed patient's old medical records in [place]  Chief Complaint: Fever, dyspnea  HPI: Heather Rogers is a 76 y.o. female with medical history significant for metastatic left breast cancer (on active treatment with [name] and letrozole) with malignant left pleural effusion s/p Pleurx catheter placement 09/07/2021, history of CVA, CKD stage IIIb, anemia of chronic disease, T2DM, HTN, HLD who presented to the ED for evaluation of fever and dyspnea.  Patient states earlier today she was walking to the kitchen to take her morning medications when she suddenly felt lightheaded/dizzy, nauseous, then threw up.  She then passed out and remembers waking up on the floor for short time later.  She did not hurt herself.  She denied any associated chest pain or diaphoresis.  She has had increased shortness of breath recently.  Her daughter notes that she has had increased output from her Pleurx recently.  Normally drains about 250 cc every 4 days.  On 7/20 she had 400 cc out and today 7/23 she had 350 cc out.  Pleural fluid has remained yellow appearing.  Her daughter notes that patient had a fever of 101.8 F today.  Patient denies any cough, dysuria, diarrhea.  Per daughter, patient is eating and drinking well as daughter provides her meals and gives her nutritional supplements/shakes.  ED Course  Labs/Imaging on admission: I have personally reviewed following labs and imaging studies.  Initial vitals showed BP 167/58, pulse 85, RR 18, temp 99.8 F, SPO2 94% on room air.  Labs show WBC 7.8, hemoglobin 9.5, platelets 306,000, sodium 137, potassium 4.0, bicarb 24, BUN 28, creatinine 1.49, serum glucose 160, LFTs within normal limits, troponin 7x2, lactic acid 1.2 > 1.0.  SARS-CoV-2 PCR negative.  Urinalysis negative for  UTI.  Blood cultures in process.  CT head without contrast negative for acute brain injury.  Mild chronic ischemic microvascular disease in the left occipital infarct noted.  CT cervical spine without contrast negative for acute C-spine injury.  Mild to moderate spondylosis throughout noted.  2 view chest x-ray shows interval increase in possibly loculated moderate volume left pleural effusion.  Left upper lobe hazy airspace opacity noted.  CTA chest shows large left pleural effusion with left lung atelectasis, significant progression since prior study.  Left chest tube seen in place.  Exam of pulmonary arteries limited but no significant central PE identified.  Metastatic lymphadenopathy in the mediastinum and hilar regions as well as pulm metastases are unchanged.  Left breast mass and left axillary lymphadenopathy again seen.  CT abdomen/pelvis with contrast negative for acute process.  Patient was given 1 L normal saline, IV vancomycin and cefepime.  The hospitalist service was consulted to admit for further evaluation and management.  Review of Systems: All systems reviewed and are negative except as documented in history of present illness above.   Past Medical History:  Diagnosis Date   Chronic kidney disease, stage 3b ([place])    Diabetes ([place])    Hypertension    Pneumonia due to COVID-19 virus 07/25/2020   Primary malignant neoplasm of breast with metastasis ([place]) 09/14/2021   Stroke (cerebrum) ([place])    Stroke ([place]) 05/15/2018    Past Surgical History:  Procedure Laterality Date   CHEST TUBE INSERTION Left 09/07/2021   Procedure: CHEST TUBE INSERTION;  Surgeon: Candee Furbish, MD;  Location: [facility] ENDOSCOPY;  Service: Pulmonary;  Laterality: Left;   EYE SURGERY Left 04/06/2020   Implant placed   EYE SURGERY Right 03/30/2020   Implant placed    hysterectomy     LOOP RECORDER INSERTION N/A 05/20/2018   Procedure: LOOP RECORDER INSERTION;  Surgeon: Thompson Grayer, MD;  Location:  [facility];  Service: Cardiovascular;  Laterality: N/A;   TEE WITHOUT CARDIOVERSION N/A 05/20/2018   Procedure: TRANSESOPHAGEAL ECHOCARDIOGRAM (TEE);  Surgeon: Sueanne Margarita, MD;  Location: [facility] ENDOSCOPY;  Service: Cardiovascular;  Laterality: N/A;    Social History:  reports that she quit smoking about 36 years ago. Her smoking use included cigarettes. She has a 15.00 pack-year smoking history. She has never used smokeless tobacco. She reports that she does not drink alcohol and does not use drugs.  Allergies  Allergen Reactions   Sulfa Antibiotics     Itching     Family History  Problem Relation Age of Onset   Hypertension Mother    Stroke Mother    Hypertension Father      Prior to Admission medications   Medication Sig Start Date End Date Taking? Authorizing Provider  abemaciclib ([name]) 100 MG tablet Take 1 tablet (100 mg total) by mouth 2 (two) times daily. Swallow tablets whole. Do not chew, crush, or split tablets before swallowing. 02/15/22   Nicholas Lose, MD  Accu-Chek FastClix Lancets MISC Check blood sugar fasting and before meals and again if pt feels bad (symptoms of hypo). 09/20/21   Charlott Rakes, MD  allopurinol (ZYLOPRIM) 100 MG tablet Take 1 tablet (100 mg total) by mouth daily. 09/27/21   Charlott Rakes, MD  amLODipine (NORVASC) 10 MG tablet Take 10 mg by mouth daily.    [provider]  aspirin EC 81 MG tablet Take 81 mg by mouth daily. Swallow whole.    [provider]  atorvastatin (LIPITOR) 80 MG tablet TAKE ONE TABLET BY MOUTH EVERY EVENING TO LOWER CHOLESTEROL 09/27/21   Charlott Rakes, MD  Blood Glucose Monitoring Suppl (ACCU-CHEK GUIDE) w/Device KIT 1 each by Does not apply route as directed. 08/25/18   Fulp, Cammie, MD  carvedilol (COREG) 25 MG tablet Take 1 tablet (25 mg total) by mouth 2 (two) times daily with a meal. 09/27/21   Charlott Rakes, MD  cholecalciferol (VITAMIN D3) 25 MCG (1000 UNIT) tablet Take 1,000 Units by  mouth daily.    [provider]  ferrous sulfate 325 (65 FE) MG EC tablet Take 1 tablet (325 mg total) by mouth in the morning and at bedtime. 05/22/21   Mayers, Cari S, PA-C  gabapentin (NEURONTIN) 100 MG capsule Take 1 capsule (100 mg total) by mouth 3 (three) times daily. 09/27/21   Charlott Rakes, MD  glucose blood (ACCU-CHEK GUIDE) test strip USE TO CHECK FASTING BLOOD SUGAR AND BEFORE MEALS AND AGAIN IF PATIENT FEELS BAD; SYMPTOMS OF HYPOGLYCEMIA 09/20/21   Charlott Rakes, MD  hydrALAZINE (APRESOLINE) 50 MG tablet Take 1 tablet (50 mg total) by mouth 3 (three) times daily. 01/08/22   Charlott Rakes, MD  letrozole (FEMARA) 2.5 MG tablet Take 1 tablet (2.5 mg total) by mouth daily. 12/15/21   Nicholas Lose, MD  loperamide (IMODIUM) 2 MG capsule Take 2 mg by mouth as needed for diarrhea or loose stools. Take 2 tabs (4 mg) with first loose stool, then 1 tab (2 mg) for each additional loose stool. Do not take more than 8 tabs (16 mg) in a 24 hour period. Patient  not taking: Reported on 02/15/2022    Nicholas Lose, MD  metFORMIN (GLUCOPHAGE) 500 MG tablet Take 1 tablet (500 mg total) by mouth daily with breakfast. TAKE ONE TABLET BY MOUTH TWICE A DAY WITH A MEAL Patient taking differently: Take 500 mg by mouth daily with breakfast. 01/08/22   Charlott Rakes, MD  Multiple Vitamin (MULTIVITAMIN WITH MINERALS) TABS tablet Take 1 tablet by mouth daily. 09/09/21   Ghimire, Henreitta Leber, MD  ondansetron (ZOFRAN) 8 MG tablet Take 1 tablet (8 mg total) by mouth every 8 (eight) hours as needed for nausea or vomiting. 09/28/21   Nicholas Lose, MD  polyethylene glycol (MIRALAX / GLYCOLAX) 17 g packet Take 17 g by mouth daily.    Nicholas Lose, MD  prochlorperazine (COMPAZINE) 10 MG tablet Take 1 tablet (10 mg total) by mouth every 6 (six) hours as needed for nausea or vomiting. Patient not taking: Reported on 02/15/2022 09/28/21   Nicholas Lose, MD    Physical Exam: Vitals:   02/18/22 1800 02/18/22 1815  02/18/22 1830 02/18/22 2017  BP: (!) 142/56 126/70 (!) 143/67   Pulse: 95 96 95   Resp: (!) 26 (!) 26 (!) 27   Temp:    (!) 100.7 F (38.2 C)  TempSrc:    Oral  SpO2: 99% 98% 98%   Weight:      Height:       Constitutional: Resting supine in bed, NAD, calm, comfortable Eyes: EOMI, lids and conjunctivae normal ENMT: Mucous membranes are moist. Posterior pharynx clear of any exudate or lesions.Normal dentition.  Neck: normal, supple, no masses. Respiratory: Diminished breath sounds left lung field otherwise clear to auscultation. Normal respiratory effort while on 2 L O2 via [place]. No accessory muscle use.  PleurX catheter in place draining yellow fluid Cardiovascular: Regular rate and rhythm, no murmurs / rubs / gallops. No extremity edema. 2+ pedal pulses. Abdomen: no tenderness, no masses palpated.  Musculoskeletal: no clubbing / cyanosis. No joint deformity upper and lower extremities. Good ROM, no contractures. Normal muscle tone.  Skin: no rashes, lesions, ulcers. No induration Neurologic: Sensation intact. Strength 5/5 in all 4.  Psychiatric: Alert and oriented x 3. Normal mood.   EKG: Personally reviewed. Normal sinus rhythm without acute ischemic changes.  Low voltage.  Similar to prior.  Assessment/Plan Principal Problem:   Malignant left pleural effusion Active Problems:   Syncope   Acute respiratory failure with hypoxia (HCC)   Primary malignant neoplasm of breast with metastasis (HCC)   Chronic kidney disease, stage 3b (HCC)   Type 2 diabetes mellitus (A1c 6.4 on 2/5)    Hypertension associated with diabetes ([place])   History of CVA   Anemia of chronic disease   Hyperlipidemia associated with type 2 diabetes mellitus ([place])   Heather Rogers is a 76 y.o. female with medical history significant for metastatic left breast cancer (on active treatment with [name] and letrozole) with malignant left pleural effusion s/p Pleurx catheter placement 09/07/2021, history of CVA,  CKD stage IIIb, anemia of chronic disease, T2DM, HTN, HLD who is admitted with enlarging left malignant pleural effusion with concern for superimposed pneumonia.  Assessment and Plan: * Malignant left pleural effusion Patient with increased volume of known left malignant pleural effusion.  Has Pleurx catheter in place with increased output.  With new fevers and immunocompromise status there is concern for superimposed infection. -Continue empiric IV vancomycin and cefepime -Follow blood cultures, send pleural fluid culture  Acute respiratory failure with hypoxia ([place]) Per  ED staff, patient hypoxic with SPO2 88-90% while at rest and down to 86% with ambulation.  Currently stable on 2 L O2 via [place].  Hypoxia secondary to enlarging pleural effusion. -Continue supplemental oxygen as needed as well as management as above  Syncope Patient reported syncopal event at home prior to ED arrival.  Suspect vasovagal syncope.  Orthostatic vitals were negative. -Monitor on telemetry -PT/OT eval  Chronic kidney disease, stage 3b ([place]) Renal function at baseline.  Continue to monitor.  Primary malignant neoplasm of breast with metastasis ([place]) Metastatic left breast cancer with malignant left pleural effusion and osseous involvement.  Following with oncology, Dr. Lindi Adie, on active treatment with letrozole and [name]. -Hold [name] and letrozole for now with suspicion for active infection  Type 2 diabetes mellitus (A1c 6.4 on 2/5)  Hold home metformin and place on SSI.  Hypertension associated with diabetes ([place]) Resume home amlodipine, Coreg, hydralazine.  Hyperlipidemia associated with type 2 diabetes mellitus (HCC) Continue atorvastatin.  Anemia of chronic disease Hemoglobin stable at 9.5.  History of CVA Continue home aspirin and statin.  DVT prophylaxis: heparin injection 5,000 Units Start: 02/18/22 2200 Code Status: Full code, confirmed with patient on admission Family Communication:  Discussed with patient's daughter at bedside Disposition Plan: From home and likely discharge to home pending clinical progress Consults called: None Severity of Illness: The appropriate patient status for this patient is INPATIENT. Inpatient status is judged to be reasonable and necessary in order to provide the required intensity of service to ensure the patient's safety. The patient's presenting symptoms, physical exam findings, and initial radiographic and laboratory data in the context of their chronic comorbidities is felt to place them at high risk for further clinical deterioration. Furthermore, it is not anticipated that the patient will be medically stable for discharge from the hospital within 2 midnights of admission.   * I certify that at the point of admission it is my clinical judgment that the patient will require inpatient hospital care spanning beyond 2 midnights from the point of admission due to high intensity of service, high risk for further deterioration and high frequency of surveillance required.Zada Finders MD [facility]  If 7PM-7AM, please contact night-coverage [email or web address]  02/18/2022, 9:06 PM

## 2022-02-18 NOTE — Assessment & Plan Note (Signed)
- 

## 2022-02-18 NOTE — Assessment & Plan Note (Signed)
Hold home metformin and place on SSI.

## 2022-02-18 NOTE — Progress Notes (Signed)
Pharmacy Antibiotic Note  Heather Rogers is a 76 y.o. female admitted on 02/18/2022 with pneumonia.  Pharmacy has been consulted for vanc/cefepime dosing.  Plan: Vanc 1g IV q48 - goal AUC 400-550 Cefepime 2g IV q24 per current renal function  Height: '5\' 2"'$  (157.5 cm) Weight: 62.1 kg (137 lb) IBW/kg (Calculated) : 50.1  Temp (24hrs), Avg:99.8 F (37.7 C), Min:99.8 F (37.7 C), Max:99.8 F (37.7 C)  Recent Labs  Lab 02/15/22 1037 02/18/22 1515 02/18/22 1538 02/18/22 1625  WBC 4.6 7.8  --   --   CREATININE 1.48* 1.49*  --   --   LATICACIDVEN  --   --  1.2 1.0    Estimated Creatinine Clearance: 27.8 mL/min (A) (by C-G formula based on SCr of 1.49 mg/dL (H)).    Allergies  Allergen Reactions   Sulfa Antibiotics     Itching      Thank you for allowing pharmacy to be a part of this patient's care.  Kara Mead 02/18/2022 8:07 PM

## 2022-02-19 ENCOUNTER — Encounter: Payer: Self-pay | Admitting: Student

## 2022-02-19 ENCOUNTER — Telehealth: Payer: Self-pay | Admitting: Student

## 2022-02-19 DIAGNOSIS — J910 Malignant pleural effusion: Secondary | ICD-10-CM | POA: Diagnosis not present

## 2022-02-19 DIAGNOSIS — J90 Pleural effusion, not elsewhere classified: Secondary | ICD-10-CM | POA: Diagnosis not present

## 2022-02-19 DIAGNOSIS — C7981 Secondary malignant neoplasm of breast: Secondary | ICD-10-CM | POA: Diagnosis not present

## 2022-02-19 LAB — BASIC METABOLIC PANEL
Anion gap: 8 (ref 5–15)
BUN: 21 mg/dL (ref 8–23)
CO2: 20 mmol/L — ABNORMAL LOW (ref 22–32)
Calcium: 8.8 mg/dL — ABNORMAL LOW (ref 8.9–10.3)
Chloride: 109 mmol/L (ref 98–111)
Creatinine, Ser: 1.41 mg/dL — ABNORMAL HIGH (ref 0.44–1.00)
GFR, Estimated: 39 mL/min — ABNORMAL LOW (ref >60)
Glucose, Bld: 105 mg/dL — ABNORMAL HIGH (ref 70–99)
Potassium: 4 mmol/L (ref 3.5–5.1)
Sodium: 137 mmol/L (ref 135–145)

## 2022-02-19 LAB — CBC
HCT: 25.5 % — ABNORMAL LOW (ref 36.0–46.0)
Hemoglobin: 8.1 g/dL — ABNORMAL LOW (ref 12.0–15.0)
MCH: 25.7 pg — ABNORMAL LOW (ref 26.0–34.0)
MCHC: 31.8 g/dL (ref 30.0–36.0)
MCV: 81 fL (ref 80.0–100.0)
Platelets: 246 10*3/uL (ref 150–400)
RBC: 3.15 MIL/uL — ABNORMAL LOW (ref 3.87–5.11)
RDW: 16 % — ABNORMAL HIGH (ref 11.5–15.5)
WBC: 6.4 10*3/uL (ref 4.0–10.5)
nRBC: 0 % (ref 0.0–0.2)

## 2022-02-19 LAB — URINE CULTURE: Culture: NO GROWTH

## 2022-02-19 LAB — GRAM STAIN

## 2022-02-19 LAB — CBG MONITORING, ED
Glucose-Capillary: 100 mg/dL — ABNORMAL HIGH (ref 70–99)
Glucose-Capillary: 105 mg/dL — ABNORMAL HIGH (ref 70–99)
Glucose-Capillary: 216 mg/dL — ABNORMAL HIGH (ref 70–99)

## 2022-02-19 LAB — PROCALCITONIN: Procalcitonin: 0.23 ng/mL

## 2022-02-19 LAB — GLUCOSE, CAPILLARY
Glucose-Capillary: 136 mg/dL — ABNORMAL HIGH (ref 70–99)
Glucose-Capillary: 159 mg/dL — ABNORMAL HIGH (ref 70–99)

## 2022-02-19 LAB — MRSA NEXT GEN BY PCR, NASAL: MRSA by PCR Next Gen: NOT DETECTED

## 2022-02-19 MED ORDER — SODIUM CHLORIDE 0.9% FLUSH
10.0000 mL | Freq: Three times a day (TID) | INTRAVENOUS | Status: DC
  Administered 2022-02-19 – 2022-02-27 (×24): 10 mL via INTRAPLEURAL

## 2022-02-19 MED ORDER — DORNASE ALFA 2.5 MG/2.5ML IN SOLN
5.0000 mg | Freq: Once | RESPIRATORY_TRACT | Status: AC
  Administered 2022-02-19: 5 mg via INTRAPLEURAL
  Filled 2022-02-19: qty 5

## 2022-02-19 MED ORDER — SODIUM CHLORIDE (PF) 0.9 % IJ SOLN
10.0000 mg | Freq: Once | INTRAMUSCULAR | Status: AC
Start: 2022-02-19 — End: 2022-02-19
  Administered 2022-02-19: 10 mg via INTRAPLEURAL
  Filled 2022-02-19: qty 10

## 2022-02-19 MED ORDER — OXYCODONE HCL 5 MG PO TABS
5.0000 mg | ORAL_TABLET | Freq: Four times a day (QID) | ORAL | Status: AC | PRN
  Administered 2022-02-19 – 2022-02-20 (×2): 5 mg via ORAL
  Filled 2022-02-19: qty 1

## 2022-02-19 MED ORDER — OXYCODONE HCL 5 MG PO TABS
ORAL_TABLET | ORAL | Status: AC
  Filled 2022-02-19: qty 1

## 2022-02-19 NOTE — Progress Notes (Signed)
PROGRESS NOTE    Heather Rogers  GNF:[id number] DOB: 1946-01-21 DOA: 02/18/2022 PCP: Charlott Rakes, MD   Brief Narrative:  Heather Rogers is a 76 y.o. female with medical history significant for metastatic left breast cancer (on active treatment with [name] and letrozole) with malignant left pleural effusion s/p Pleurx catheter placement 09/07/2021, history of CVA, CKD stage IIIb, anemia of chronic disease, T2DM, HTN, HLD who is admitted with enlarging left malignant pleural effusion with concern for superimposed pneumonia.    Assessment & Plan:   Principal Problem:   Malignant left pleural effusion Active Problems:   Syncope   Acute respiratory failure with hypoxia (HCC)   Primary malignant neoplasm of breast with metastasis (HCC)   Chronic kidney disease, stage 3b (HCC)   Type 2 diabetes mellitus (A1c 6.4 on 2/5)    Hypertension associated with diabetes ([place])   History of CVA   Anemia of chronic disease   Hyperlipidemia associated with type 2 diabetes mellitus (HCC)  Assessment and Plan:   Malignant left pleural effusion Patient with increased volume of known left malignant pleural effusion.  Has Pleurx catheter in place with increased output.  With new fevers and immunocompromise status there is concern for superimposed infection. -Continue empiric IV vancomycin and cefepime -Follow blood cultures, send pleural fluid culture, pending   Acute respiratory failure with hypoxia ([place]) Per ED staff, patient hypoxic with SPO2 88-90% while at rest and down to 86% with ambulation.  Currently stable on 2 L O2 via [place].  Hypoxia secondary to enlarging pleural effusion. -Continue supplemental oxygen as needed as well as management as above   Syncope Patient reported syncopal event at home prior to ED arrival.  Suspect vasovagal syncope.  Orthostatic vitals were negative. -Monitor on telemetry -PT/OT eval   Chronic kidney disease, stage 3b ([place]) Renal function at baseline.   Continue to monitor.   Primary malignant neoplasm of breast with metastasis ([place]) Metastatic left breast cancer with malignant left pleural effusion and osseous involvement.  Following with oncology, Dr. Lindi Adie, on active treatment with letrozole and [name]. -Hold [name] and letrozole for now with suspicion for active infection   Type 2 diabetes mellitus (A1c 6.4 on 2/5)  Hold home metformin and place on SSI.   Hypertension associated with diabetes ([place]) Resume home amlodipine, Coreg, hydralazine.   Hyperlipidemia associated with type 2 diabetes mellitus (HCC) Continue atorvastatin.   Anemia of chronic disease Hemoglobin downtrending, but no overt bleeding noted Recheck CBC in a.m.   History of CVA Continue home aspirin and statin.    DVT prophylaxis: Heparin Code Status: Full Family Communication: Discussed with daughter on phone 7/24 Disposition Plan:  Status is: Inpatient Remains inpatient appropriate because: Need for IV antibiotics.   Consultants:  None  Procedures:  None  Antimicrobials:  Anti-infectives (From admission, onward)    Start     Dose/Rate Route Frequency Ordered Stop   02/20/22 1600  vancomycin (VANCOCIN) IVPB 1000 mg/200 mL premix        1,000 mg 200 mL/hr over 60 Minutes Intravenous Every 48 hours 02/18/22 2008     02/19/22 1600  ceFEPIme (MAXIPIME) 2 g in sodium chloride 0.9 % 100 mL IVPB        2 g 200 mL/hr over 30 Minutes Intravenous Every 24 hours 02/18/22 2004     02/18/22 1530  ceFEPIme (MAXIPIME) 2 g in sodium chloride 0.9 % 100 mL IVPB        2 g 200 mL/hr over 30  Minutes Intravenous  Once 02/18/22 1517 02/18/22 1608   02/18/22 1530  vancomycin (VANCOCIN) IVPB 1000 mg/200 mL premix        1,000 mg 200 mL/hr over 60 Minutes Intravenous  Once 02/18/22 1526 02/18/22 1715       Subjective: Patient seen and evaluated today with no new acute complaints or concerns. No acute concerns or events noted overnight.  Objective: Vitals:    02/19/22 0900 02/19/22 0930 02/19/22 0945 02/19/22 1000  BP: (!) 144/85 (!) 115/57 (!) 127/55 (!) 122/58  Pulse: 83 82 82 84  Resp: (!) 22 (!) '24 20 17  '$ Temp:      TempSrc:      SpO2: 98% 97% 97% 96%  Weight:      Height:       No intake or output data in the 24 hours ending 02/19/22 1018 Filed Weights   02/18/22 1428  Weight: 62.1 kg    Examination:  General exam: Appears calm and comfortable  Respiratory system: Clear to auscultation. Respiratory effort normal.  2 L nasal cannula, serosanguineous fluid drainage from left chest Cardiovascular system: S1 & S2 heard, RRR.  Gastrointestinal system: Abdomen is soft Central nervous system: Alert and awake Extremities: No edema Skin: No significant lesions noted Psychiatry: Flat affect.    Data Reviewed: I have personally reviewed following labs and imaging studies  CBC: Recent Labs  Lab 02/15/22 1037 02/18/22 1515 02/19/22 0500  WBC 4.6 7.8 6.4  NEUTROABS 2.9 6.0  --   HGB 9.3* 9.5* 8.1*  HCT 28.5* 29.3* 25.5*  MCV 79.4* 80.3 81.0  PLT 269 306 572   Basic Metabolic Panel: Recent Labs  Lab 02/15/22 1037 02/18/22 1515 02/19/22 0500  NA 139 137 137  K 4.3 4.0 4.0  CL 106 106 109  CO2 28 24 20*  GLUCOSE 95 160* 105*  BUN 20 28* 21  CREATININE 1.48* 1.49* 1.41*  CALCIUM 10.0 9.6 8.8*   GFR: Estimated Creatinine Clearance: 29.4 mL/min (A) (by C-G formula based on SCr of 1.41 mg/dL (H)). Liver Function Tests: Recent Labs  Lab 02/15/22 1037 02/18/22 1515  AST 16 18  ALT 12 15  ALKPHOS 59 50  BILITOT 0.3 0.3  PROT 7.2 7.5  ALBUMIN 4.0 3.6   No results for input(s): "LIPASE", "AMYLASE" in the last 168 hours. No results for input(s): "AMMONIA" in the last 168 hours. Coagulation Profile: No results for input(s): "INR", "PROTIME" in the last 168 hours. Cardiac Enzymes: No results for input(s): "CKTOTAL", "CKMB", "CKMBINDEX", "TROPONINI" in the last 168 hours. BNP (last 3 results) No results for  input(s): "PROBNP" in the last 8760 hours. HbA1C: No results for input(s): "HGBA1C" in the last 72 hours. CBG: Recent Labs  Lab 02/18/22 1536 02/18/22 2258 02/18/22 2311 02/19/22 0721 02/19/22 0742  GLUCAP 140* 124* 116* 100* 105*   Lipid Profile: No results for input(s): "CHOL", "HDL", "LDLCALC", "TRIG", "CHOLHDL", "LDLDIRECT" in the last 72 hours. Thyroid Function Tests: No results for input(s): "TSH", "T4TOTAL", "FREET4", "T3FREE", "THYROIDAB" in the last 72 hours. Anemia Panel: No results for input(s): "VITAMINB12", "FOLATE", "FERRITIN", "TIBC", "IRON", "RETICCTPCT" in the last 72 hours. Sepsis Labs: Recent Labs  Lab 02/18/22 1538 02/18/22 1625  LATICACIDVEN 1.2 1.0    Recent Results (from the past 240 hour(s))  SARS Coronavirus 2 by RT PCR (hospital order, performed in [facility] hospital lab) *cepheid single result test*     Status: None   Collection Time: 02/18/22  3:30 PM   Specimen: Nasal  Swab  Result Value Ref Range Status   SARS Coronavirus 2 by RT PCR NEGATIVE NEGATIVE Final    Comment: (NOTE) SARS-CoV-2 target nucleic acids are NOT DETECTED.  The SARS-CoV-2 RNA is generally detectable in upper and lower respiratory specimens during the acute phase of infection. The lowest concentration of SARS-CoV-2 viral copies this assay can detect is 250 copies / mL. A negative result does not preclude SARS-CoV-2 infection and should not be used as the sole basis for treatment or other patient management decisions.  A negative result may occur with improper specimen collection / handling, submission of specimen other than nasopharyngeal swab, presence of viral mutation(s) within the areas targeted by this assay, and inadequate number of viral copies (<250 copies / mL). A negative result must be combined with clinical observations, patient history, and epidemiological information.  Fact Sheet for Patients:   https://www.patel.info/  Fact Sheet for  Healthcare Providers: https://hall.com/  This test is not yet approved or  cleared by the [place] FDA and has been authorized for detection and/or diagnosis of SARS-CoV-2 by FDA under an Emergency Use Authorization (EUA).  This EUA will remain in effect (meaning this test can be used) for the duration of the COVID-19 declaration under Section 564(b)(1) of the Act, 21 U.S.C. section 360bbb-3(b)(1), unless the authorization is terminated or revoked sooner.  Performed at [facility], [place] [address] [place], [place] [address]   Culture, body fluid w Gram Stain-bottle     Status: None (Preliminary result)   Collection Time: 02/18/22  7:04 PM   Specimen: Fluid  Result Value Ref Range Status   Specimen Description FLUID PLEURAL  Final   Special Requests   Final    BOTTLES DRAWN AEROBIC AND ANAEROBIC Blood Culture adequate volume Performed at [facility], [place] [address] [place], [place] [address]    Culture PENDING  Incomplete   Report Status PENDING  Incomplete  Gram stain     Status: None   Collection Time: 02/18/22  7:04 PM   Specimen: Fluid  Result Value Ref Range Status   Specimen Description FLUID PLEURAL  Final   Special Requests NONE  Final   Gram Stain   Final    FEW WBC PRESENT,BOTH PMN AND MONONUCLEAR NO ORGANISMS SEEN Performed at [facility], [address], [place], [place] [address]    Report Status 02/19/2022 FINAL  Final         Radiology Studies: CT Angio Chest PE W and/or Wo Contrast  Result Date: 02/18/2022 CLINICAL DATA:  Pulmonary embolus suspected with high probability. History of metastatic breast cancer. Today vomiting and fever. Currently on oral chemotherapy. EXAM: CT ANGIOGRAPHY CHEST CT ABDOMEN AND PELVIS WITH CONTRAST TECHNIQUE: Multidetector CT imaging of the chest was performed using the standard protocol during bolus administration of intravenous contrast. Multiplanar CT image  reconstructions and MIPs were obtained to evaluate the vascular anatomy. Multidetector CT imaging of the abdomen and pelvis was performed using the standard protocol during bolus administration of intravenous contrast. RADIATION DOSE REDUCTION: This exam was performed according to the departmental dose-optimization program which includes automated exposure control, adjustment of the mA and/or kV according to patient size and/or use of iterative reconstruction technique. CONTRAST:  40m OMNIPAQUE IOHEXOL 350 MG/ML SOLN COMPARISON:  PET-CT 09/29/2021.  CT chest 01/15/2022 FINDINGS: CTA CHEST FINDINGS Cardiovascular: Evaluation of the pulmonary arteries is limited by suboptimal contrast bolus and motion artifact. As visualized, there are no significant filling defects in  the central pulmonary arteries suggesting no large central pulmonary embolus. Heart size is normal. No pericardial effusion. Normal caliber thoracic aorta. No aortic dissection. Calcification of the aorta and coronary arteries. Mediastinum/Nodes: The esophagus is decompressed. Subcentimeter right thyroid gland nodule is unchanged since prior studies. Hilar, subcarinal, and left aortopulmonic window lymph nodes are similar to prior study with left AP window nodes measuring 1.6 cm in short axis dimension. This corresponds to known metastatic disease. Lungs/Pleura: There is a large left pleural effusion with somewhat nodular appearance likely representing a malignant effusion. This is significantly increased in size since the prior study despite presence of a chest drain. Atelectasis in the left lung. Right lung is clear. Airways are patent. Musculoskeletal: Degenerative changes in the spine. Heterogeneous areas of bone sclerosis and destruction in the sternum and in several midthoracic vertebra, likely representing bone metastasis. This corresponds to changes on prior PET-CT and CT. Large soft tissue mass in the left breast corresponding to known  neoplasm. Skin thickening and infiltration could be neoplastic or radiation related. Lymphadenopathy in the left axilla consistent with metastatic disease. Review of the MIP images confirms the above findings. CT ABDOMEN and PELVIS FINDINGS Hepatobiliary: No focal liver abnormality is seen. No gallstones, gallbladder wall thickening, or biliary dilatation. Pancreas: Unremarkable. No pancreatic ductal dilatation or surrounding inflammatory changes. Spleen: Normal in size without focal abnormality. Adrenals/Urinary Tract: No adrenal gland nodules. Subcentimeter simple appearing cysts in both kidneys consistent with benign cyst. No imaging follow-up is indicated for these typically benign lesions. Renal nephrograms are symmetrical. No hydronephrosis or hydroureter. Bladder is normal. Stomach/Bowel: Stomach is within normal limits. Appendix appears normal. No evidence of bowel wall thickening, distention, or inflammatory changes. Vascular/Lymphatic: Scattered lymph nodes in the celiac axis and retroperitoneum without pathologic enlargement. Diffuse calcification of the aorta. No aneurysm. Reproductive: No abnormal pelvic masses. Other: No free air or free fluid in the abdomen. Abdominal wall musculature appears intact. Musculoskeletal: No destructive bone lesions. Review of the MIP images confirms the above findings. IMPRESSION: 1. Examination of the pulmonary arteries is technically limited but no significant central pulmonary embolus is identified. 2. Large left pleural effusion with left lung atelectasis demonstrates significant progression since previous study. A left chest tube is in place. 3. Metastatic lymphadenopathy in the mediastinum and hilar regions as well as bone metastases are unchanged. Left breast mass and left axillary lymphadenopathy again demonstrated. 4. No acute process demonstrated in the abdomen or pelvis. Electronically Signed   By: Lucienne Capers M.D.   On: 02/18/2022 17:58   CT ABDOMEN  PELVIS W CONTRAST  Result Date: 02/18/2022 CLINICAL DATA:  Pulmonary embolus suspected with high probability. History of metastatic breast cancer. Today vomiting and fever. Currently on oral chemotherapy. EXAM: CT ANGIOGRAPHY CHEST CT ABDOMEN AND PELVIS WITH CONTRAST TECHNIQUE: Multidetector CT imaging of the chest was performed using the standard protocol during bolus administration of intravenous contrast. Multiplanar CT image reconstructions and MIPs were obtained to evaluate the vascular anatomy. Multidetector CT imaging of the abdomen and pelvis was performed using the standard protocol during bolus administration of intravenous contrast. RADIATION DOSE REDUCTION: This exam was performed according to the departmental dose-optimization program which includes automated exposure control, adjustment of the mA and/or kV according to patient size and/or use of iterative reconstruction technique. CONTRAST:  18m OMNIPAQUE IOHEXOL 350 MG/ML SOLN COMPARISON:  PET-CT 09/29/2021.  CT chest 01/15/2022 FINDINGS: CTA CHEST FINDINGS Cardiovascular: Evaluation of the pulmonary arteries is limited by suboptimal contrast bolus and motion artifact. As  visualized, there are no significant filling defects in the central pulmonary arteries suggesting no large central pulmonary embolus. Heart size is normal. No pericardial effusion. Normal caliber thoracic aorta. No aortic dissection. Calcification of the aorta and coronary arteries. Mediastinum/Nodes: The esophagus is decompressed. Subcentimeter right thyroid gland nodule is unchanged since prior studies. Hilar, subcarinal, and left aortopulmonic window lymph nodes are similar to prior study with left AP window nodes measuring 1.6 cm in short axis dimension. This corresponds to known metastatic disease. Lungs/Pleura: There is a large left pleural effusion with somewhat nodular appearance likely representing a malignant effusion. This is significantly increased in size since the  prior study despite presence of a chest drain. Atelectasis in the left lung. Right lung is clear. Airways are patent. Musculoskeletal: Degenerative changes in the spine. Heterogeneous areas of bone sclerosis and destruction in the sternum and in several midthoracic vertebra, likely representing bone metastasis. This corresponds to changes on prior PET-CT and CT. Large soft tissue mass in the left breast corresponding to known neoplasm. Skin thickening and infiltration could be neoplastic or radiation related. Lymphadenopathy in the left axilla consistent with metastatic disease. Review of the MIP images confirms the above findings. CT ABDOMEN and PELVIS FINDINGS Hepatobiliary: No focal liver abnormality is seen. No gallstones, gallbladder wall thickening, or biliary dilatation. Pancreas: Unremarkable. No pancreatic ductal dilatation or surrounding inflammatory changes. Spleen: Normal in size without focal abnormality. Adrenals/Urinary Tract: No adrenal gland nodules. Subcentimeter simple appearing cysts in both kidneys consistent with benign cyst. No imaging follow-up is indicated for these typically benign lesions. Renal nephrograms are symmetrical. No hydronephrosis or hydroureter. Bladder is normal. Stomach/Bowel: Stomach is within normal limits. Appendix appears normal. No evidence of bowel wall thickening, distention, or inflammatory changes. Vascular/Lymphatic: Scattered lymph nodes in the celiac axis and retroperitoneum without pathologic enlargement. Diffuse calcification of the aorta. No aneurysm. Reproductive: No abnormal pelvic masses. Other: No free air or free fluid in the abdomen. Abdominal wall musculature appears intact. Musculoskeletal: No destructive bone lesions. Review of the MIP images confirms the above findings. IMPRESSION: 1. Examination of the pulmonary arteries is technically limited but no significant central pulmonary embolus is identified. 2. Large left pleural effusion with left lung  atelectasis demonstrates significant progression since previous study. A left chest tube is in place. 3. Metastatic lymphadenopathy in the mediastinum and hilar regions as well as bone metastases are unchanged. Left breast mass and left axillary lymphadenopathy again demonstrated. 4. No acute process demonstrated in the abdomen or pelvis. Electronically Signed   By: Lucienne Capers M.D.   On: 02/18/2022 17:58   CT Head Wo Contrast  Result Date: 02/18/2022 CLINICAL DATA:  History of metastatic breast cancer presents with vomiting and fever. Currently on oral chemotherapy. One episode emesis. EXAM: CT HEAD WITHOUT CONTRAST CT CERVICAL SPINE WITHOUT CONTRAST TECHNIQUE: Multidetector CT imaging of the head and cervical spine was performed following the standard protocol without intravenous contrast. Multiplanar CT image reconstructions of the cervical spine were also generated. RADIATION DOSE REDUCTION: This exam was performed according to the departmental dose-optimization program which includes automated exposure control, adjustment of the mA and/or kV according to patient size and/or use of iterative reconstruction technique. COMPARISON:  Head CT 05/15/2018 FINDINGS: CT HEAD FINDINGS Brain: Ventricles, cisterns and other CSF spaces are within normal. Mild chronic ischemic microvascular disease. Old left occipital infarct. No mass, mass effect or shift of midline structures. No acute hemorrhage. No definite acute infarction. Vascular: No hyperdense vessel or unexpected calcification. Skull: Normal.  Negative for fracture or focal lesion. Sinuses/Orbits: No acute finding. Other: None. CT CERVICAL SPINE FINDINGS Alignment: Normal. Skull base and vertebrae: Mild to moderate spondylosis throughout the cervical spine to include facet arthropathy and uncovertebral joint spurring. Atlantoaxial articulation is unremarkable. Minimal bilateral neural foraminal narrowing at the C5-6 level and mild left-sided neural foraminal  narrowing at the C6-7 level. No acute fracture. Soft tissues and spinal canal: Prevertebral soft tissues are normal. No significant canal stenosis. Disc levels: Disc space narrowing at the C5-6 and C6-7 levels and to lesser extent at the C4-5 and C7-T1 levels. Upper chest: No acute findings. Other: None. IMPRESSION: 1. No acute brain injury. 2. 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 3. No acute cervical spine injury. 4. Mild to moderate spondylosis throughout the cervical spine with multilevel disc disease and neural foraminal narrowing as described. Electronically Signed   By: Marin Olp M.D.   On: 02/18/2022 15:12   CT Cervical Spine Wo Contrast  Result Date: 02/18/2022 CLINICAL DATA:  History of metastatic breast cancer presents with vomiting and fever. Currently on oral chemotherapy. One episode emesis. EXAM: CT HEAD WITHOUT CONTRAST CT CERVICAL SPINE WITHOUT CONTRAST TECHNIQUE: Multidetector CT imaging of the head and cervical spine was performed following the standard protocol without intravenous contrast. Multiplanar CT image reconstructions of the cervical spine were also generated. RADIATION DOSE REDUCTION: This exam was performed according to the departmental dose-optimization program which includes automated exposure control, adjustment of the mA and/or kV according to patient size and/or use of iterative reconstruction technique. COMPARISON:  Head CT 05/15/2018 FINDINGS: CT HEAD FINDINGS Brain: Ventricles, cisterns and other CSF spaces are within normal. Mild chronic ischemic microvascular disease. Old left occipital infarct. No mass, mass effect or shift of midline structures. No acute hemorrhage. No definite acute infarction. Vascular: No hyperdense vessel or unexpected calcification. Skull: Normal. Negative for fracture or focal lesion. Sinuses/Orbits: No acute finding. Other: None. CT CERVICAL SPINE FINDINGS Alignment: Normal. Skull base and vertebrae: Mild  to moderate spondylosis throughout the cervical spine to include facet arthropathy and uncovertebral joint spurring. Atlantoaxial articulation is unremarkable. Minimal bilateral neural foraminal narrowing at the C5-6 level and mild left-sided neural foraminal narrowing at the C6-7 level. No acute fracture. Soft tissues and spinal canal: Prevertebral soft tissues are normal. No significant canal stenosis. Disc levels: Disc space narrowing at the C5-6 and C6-7 levels and to lesser extent at the C4-5 and C7-T1 levels. Upper chest: No acute findings. Other: None. IMPRESSION: 1. No acute brain injury. 2. 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 3. No acute cervical spine injury. 4. Mild to moderate spondylosis throughout the cervical spine with multilevel disc disease and neural foraminal narrowing as described. Electronically Signed   By: Marin Olp M.D.   On: 02/18/2022 15:12   DG Chest 2 View  Result Date: 02/18/2022 CLINICAL DATA:  fx/sob EXAM: CHEST - 2 VIEW COMPARISON:  Chest x-ray 09/07/2021, CT chest 01/15/2022 FINDINGS: The heart and mediastinal contours are unchanged. Aortic calcification. Left upper lobe hazy airspace opacity. No pulmonary edema. Interval increase in size of a possibly loculated, moderate volume left pleural effusion. No right pleural effusion. No pneumothorax. No acute osseous abnormality. IMPRESSION: 1. Interval increase in size of a possibly loculated, moderate volume left pleural effusion. 2. Left upper lobe hazy airspace opacity may represent combination of atelectasis versus infection/inflammation. 3.  Aortic Atherosclerosis (ICD10-I70.0). Electronically Signed   By: Iven Finn M.D.   On: 02/18/2022  15:07        Scheduled Meds:  amLODipine  10 mg Oral Daily   aspirin EC  81 mg Oral Daily   atorvastatin  80 mg Oral QPM   carvedilol  25 mg Oral BID WC   gabapentin  100 mg Oral TID   heparin  5,000 Units Subcutaneous Q8H    hydrALAZINE  50 mg Oral TID   insulin aspart  0-9 Units Subcutaneous TID WC   polyethylene glycol  17 g Oral Daily   sodium chloride flush  3 mL Intravenous Q12H   Continuous Infusions:  ceFEPime (MAXIPIME) IV     [START ON 02/20/2022] vancomycin       LOS: 1 day    Time spent: 35 minutes    Mikaelyn Arthurs Darleen Crocker, DO [facility]  If 7PM-7AM, please contact night-coverage [email or web address] 02/19/2022, 10:18 AM

## 2022-02-19 NOTE — Telephone Encounter (Signed)
Called patient back and she states that she has been in the ED since yesterday. I advised patient that when the nurse/Dr come in the room to have the call the on call doctor that is associated with [facility]. Nothing further needed

## 2022-02-19 NOTE — Procedures (Signed)
Pleural Fibrinolytic Administration Procedure Note  Heather Rogers  [id number]  [birth year]  Date:02/19/22  Time:6:29 PM   Provider Performing:Vella Colquitt W [place]   Procedure: Pleural Fibrinolysis Initial day [phone])  Indication(s) Fibrinolysis of complicated pleural effusion  Consent Risks of the procedure as well as the alternatives and risks of each were explained to the patient and/or caregiver.  Consent for the procedure was obtained.   Anesthesia None   Time Out Verified patient identification, verified procedure, site/side was marked, verified correct patient position, special equipment/implants available, medications/allergies/relevant history reviewed, required imaging and test results available.   Sterile Technique Hand hygiene, gloves   Procedure Description Existing pleural catheter was cleaned and accessed in sterile manner.  '10mg'$  of tPA in 30cc of saline and '5mg'$  of dornase in 30cc of sterile water were injected into pleural space using existing pleural catheter.  Catheter will be clamped for 1 hour and then placed back to suction. Instruction given to bedside RN.    Complications/Tolerance None; patient tolerated the procedure well.  EBL None   Specimen(s) None   Georgann Housekeeper, AGACNP-BC [facility]  See Amion for personal pager PCCM on call pager [phone] until 7pm. Please call Elink 7p-7a. [phone]  02/19/2022 6:30 PM

## 2022-02-19 NOTE — Progress Notes (Signed)
PT Cancellation Note  Patient Details Name: Heather Rogers MRN: [id number] DOB: 08/01/1945   Cancelled Treatment:    Reason Eval/Treat Not Completed: Patient not medically ready, patient for admission, will check back tomorrow. [place] Office [phone] Weekend pager-[phone]    Claretha Cooper 02/19/2022, 2:43 PM

## 2022-02-19 NOTE — Consult Note (Addendum)
NAME:  Heather Rogers, MRN:  [id number], DOB:  [birth year], LOS: 1 ADMISSION DATE:  02/18/2022, CONSULTATION DATE:  7/24 REFERRING MD:  Dr. Manuella Ghazi, CHIEF COMPLAINT:  SOB   History of Present Illness:  76 year old female with past medical history as below, which is significant for CKD 3, diabetes, hypertension, metastatic breast cancer for which she is currently undergoing treatment with [name] and letrozole.  She has Pleurx catheter on the left which is followed by Dr. Verlee Monte in the pulmonary clinic.  She was last seen in April and at that time was draining the Pleurx every other day.  Output decreased and she was able to drain about every 4 days.  Over the course of a couple of weeks her output has significantly increased to the point where her daughter was draining every 2 to 3 days with outputs around 2-300.  On 7/23 the patient developed fever and suffered a syncopal episode for which she presented to [facility] emergency department.  Upon arrival to the emergency department she was hypoxic requiring 2 L of supplemental oxygen and was febrile to 101F.  Work-up in the emergency department included CT angiogram of the chest demonstrating large pleural effusion on the left with areas concerning for loculation. Pleurx vacuum bottle attached in ED and only drained a small amount of fluid. PCCM consulted for Pleurx and effusion management.    Pertinent  Medical History   has a past medical history of Chronic kidney disease, stage 3b ([place]), Diabetes ([place]), Hypertension, Pneumonia due to COVID-19 virus (07/25/2020), Primary malignant neoplasm of breast with metastasis ([place]) (09/14/2021), Stroke (cerebrum) ([place]), and Stroke ([place]) (05/15/2018).   Significant Hospital Events: Including procedures, antibiotic start and stop dates in addition to other pertinent events     Interim History / Subjective:    Objective   Blood pressure (!) 142/58, pulse 91, temperature (!) 101.2 F (38.4 C), temperature  source Oral, resp. rate (!) 32, height 5' 2"  (1.575 m), weight 62.1 kg, SpO2 95 %.       No intake or output data in the 24 hours ending 02/19/22 1552 Filed Weights   02/18/22 1428  Weight: 62.1 kg    Examination: General: chronically ill appearing elderly female in NAD HENT: [place]/AT, PERRL, no JVD Lungs: Markedly diminished on the L. PleurX catheter on the left with fibrinous drainage in the tube.  Cardiovascular: RRR, no MRG Abdomen: Soft, non-tender, non-distended Extremities: No acute deformity. No edema.  Neuro: Alert, oriented, non-focal  CTA chest: no clear evidence of PE, although imaging for this not great. Large left pleural effusion, which appears to be loculated. Mediastinal LAN and bone mets unchanged.   Resolved Hospital Problem list     Assessment & Plan:   Pleural effusion on the left: Initially a malignant effusion secondary to breast Ca. Pleurx placed in February. Now with increasing effusion. Not draining via pleurx. Now with large L effusion with loculations. Parapneumonic effusion vs empyema. I have placed the Pleurx to wall suction via waterseal device.  - No additional drainage with wall suction 20cm H20. Will attempt to flush pleurx and instil alteplase and dornase into left pleural space.  - Small lumen of pleurx connection may make drainage of fibrinous material difficult. It is possible she will need additional tube placed. Discussed with family. Will reevaluate tomorrow.  - Pleural fluid culture pending.   Acute hypoxemic respiratory failure secondary to effusion and likely pneumonia. - Continue empiric antibiotics - Supplemental oxygen to keep O2 sats >  92% - Blood culture pending  Metastatic breast Ca - Holding oral chemotherapy  Remainder per primary team  Best Practice (right click and "Reselect all SmartList Selections" daily)   Per primary  Labs   CBC: Recent Labs  Lab 02/15/22 1037 02/18/22 1515 02/19/22 0500  WBC 4.6 7.8 6.4   NEUTROABS 2.9 6.0  --   HGB 9.3* 9.5* 8.1*  HCT 28.5* 29.3* 25.5*  MCV 79.4* 80.3 81.0  PLT 269 306 132    Basic Metabolic Panel: Recent Labs  Lab 02/15/22 1037 02/18/22 1515 02/19/22 0500  NA 139 137 137  K 4.3 4.0 4.0  CL 106 106 109  CO2 28 24 20*  GLUCOSE 95 160* 105*  BUN 20 28* 21  CREATININE 1.48* 1.49* 1.41*  CALCIUM 10.0 9.6 8.8*   GFR: Estimated Creatinine Clearance: 29.4 mL/min (A) (by C-G formula based on SCr of 1.41 mg/dL (H)). Recent Labs  Lab 02/15/22 1037 02/18/22 1515 02/18/22 1538 02/18/22 1625 02/19/22 0500 02/19/22 1059  PROCALCITON  --   --   --   --   --  0.23  WBC 4.6 7.8  --   --  6.4  --   LATICACIDVEN  --   --  1.2 1.0  --   --     Liver Function Tests: Recent Labs  Lab 02/15/22 1037 02/18/22 1515  AST 16 18  ALT 12 15  ALKPHOS 59 50  BILITOT 0.3 0.3  PROT 7.2 7.5  ALBUMIN 4.0 3.6   No results for input(s): "LIPASE", "AMYLASE" in the last 168 hours. No results for input(s): "AMMONIA" in the last 168 hours.  ABG    Component Value Date/Time   TCO2 20 (L) 05/15/2018 0856     Coagulation Profile: No results for input(s): "INR", "PROTIME" in the last 168 hours.  Cardiac Enzymes: No results for input(s): "CKTOTAL", "CKMB", "CKMBINDEX", "TROPONINI" in the last 168 hours.  HbA1C: Hemoglobin A1C  Date/Time Value Ref Range Status  05/22/2021 11:54 AM 6.2 (A) 4.0 - 5.6 % Final   HbA1c, POC (controlled diabetic range)  Date/Time Value Ref Range Status  01/08/2022 10:41 AM 5.5 0.0 - 7.0 % Final  11/17/2020 11:18 AM 6.7 0.0 - 7.0 % Final   Hgb A1c MFr Bld  Date/Time Value Ref Range Status  09/03/2021 12:37 AM 6.4 (H) 4.8 - 5.6 % Final    Comment:    (NOTE) Pre diabetes:          5.7%-6.4%  Diabetes:              >6.4%  Glycemic control for   <7.0% adults with diabetes     CBG: Recent Labs  Lab 02/18/22 2258 02/18/22 2311 02/19/22 0721 02/19/22 0742 02/19/22 1147  GLUCAP 124* 116* 100* 105* 216*    Review  of Systems:   Bolds are positive  Constitutional: weight loss, gain, night sweats, Fevers, chills, fatigue .  HEENT: headaches, Sore throat, sneezing, nasal congestion, post nasal drip, Difficulty swallowing, Tooth/dental problems, visual complaints visual changes, ear ache CV:  chest pain, radiates:,Orthopnea, PND, swelling in lower extremities, dizziness, palpitations, syncope.  GI  heartburn, indigestion, abdominal pain, nausea, vomiting, diarrhea, change in bowel habits, loss of appetite, bloody stools.  Resp: cough, productive: , hemoptysis, dyspnea, chest pain, pleuritic.  Skin: rash or itching or icterus GU: dysuria, change in color of urine, urgency or frequency. flank pain, hematuria  MS: joint pain or swelling. decreased range of motion  Psych: change in mood or  affect. depression or anxiety.  Neuro: difficulty with speech, weakness, numbness, ataxia    Past Medical History:  She,  has a past medical history of Chronic kidney disease, stage 3b ([place]), Diabetes ([place]), Hypertension, Pneumonia due to COVID-19 virus (07/25/2020), Primary malignant neoplasm of breast with metastasis ([place]) (09/14/2021), Stroke (cerebrum) ([place]), and Stroke ([place]) (05/15/2018).   Surgical History:   Past Surgical History:  Procedure Laterality Date   CHEST TUBE INSERTION Left 09/07/2021   Procedure: CHEST TUBE INSERTION;  Surgeon: Candee Furbish, MD;  Location: [facility] ENDOSCOPY;  Service: Pulmonary;  Laterality: Left;   EYE SURGERY Left 04/06/2020   Implant placed   EYE SURGERY Right 03/30/2020   Implant placed    hysterectomy     LOOP RECORDER INSERTION N/A 05/20/2018   Procedure: LOOP RECORDER INSERTION;  Surgeon: Thompson Grayer, MD;  Location: [facility] CV LAB;  Service: Cardiovascular;  Laterality: N/A;   TEE WITHOUT CARDIOVERSION N/A 05/20/2018   Procedure: TRANSESOPHAGEAL ECHOCARDIOGRAM (TEE);  Surgeon: Sueanne Margarita, MD;  Location: [facility] ENDOSCOPY;  Service: Cardiovascular;  Laterality: N/A;      Social History:   reports that she quit smoking about 36 years ago. Her smoking use included cigarettes. She has a 15.00 pack-year smoking history. She has never used smokeless tobacco. She reports that she does not drink alcohol and does not use drugs.   Family History:  Her family history includes Hypertension in her father and mother; Stroke in her mother.   Allergies Allergies  Allergen Reactions   Sulfa Antibiotics     Itching      Home Medications  Prior to Admission medications   Medication Sig Start Date End Date Taking? Authorizing Provider  abemaciclib ([name]) 100 MG tablet Take 1 tablet (100 mg total) by mouth 2 (two) times daily. Swallow tablets whole. Do not chew, crush, or split tablets before swallowing. 02/15/22  Yes Nicholas Lose, MD  allopurinol (ZYLOPRIM) 100 MG tablet Take 1 tablet (100 mg total) by mouth daily. 09/27/21  Yes Charlott Rakes, MD  amLODipine (NORVASC) 10 MG tablet Take 10 mg by mouth daily.   Yes [provider]  aspirin EC 81 MG tablet Take 81 mg by mouth daily. Swallow whole.   Yes [provider]  atorvastatin (LIPITOR) 80 MG tablet TAKE ONE TABLET BY MOUTH EVERY EVENING TO LOWER CHOLESTEROL Patient taking differently: Take 80 mg by mouth every evening. 09/27/21  Yes Charlott Rakes, MD  carvedilol (COREG) 25 MG tablet Take 1 tablet (25 mg total) by mouth 2 (two) times daily with a meal. 09/27/21  Yes Newlin, Enobong, MD  cholecalciferol (VITAMIN D3) 25 MCG (1000 UNIT) tablet Take 1,000 Units by mouth daily.   Yes [provider]  ferrous sulfate 325 (65 FE) MG EC tablet Take 1 tablet (325 mg total) by mouth in the morning and at bedtime. 05/22/21  Yes Mayers, Cari S, PA-C  gabapentin (NEURONTIN) 100 MG capsule Take 1 capsule (100 mg total) by mouth 3 (three) times daily. 09/27/21  Yes Charlott Rakes, MD  hydrALAZINE (APRESOLINE) 50 MG tablet Take 1 tablet (50 mg total) by mouth 3 (three) times daily. 01/08/22  Yes Charlott Rakes, MD  letrozole (FEMARA) 2.5 MG tablet Take 1 tablet (2.5 mg total) by mouth daily. 12/15/21  Yes Nicholas Lose, MD  metFORMIN (GLUCOPHAGE) 500 MG tablet Take 1 tablet (500 mg total) by mouth daily with breakfast. TAKE ONE TABLET BY MOUTH TWICE A DAY WITH A MEAL Patient taking differently: Take 500  mg by mouth daily with breakfast. 01/08/22  Yes Charlott Rakes, MD  Multiple Vitamin (MULTIVITAMIN WITH MINERALS) TABS tablet Take 1 tablet by mouth daily. 09/09/21  Yes Ghimire, Henreitta Leber, MD  ondansetron (ZOFRAN) 8 MG tablet Take 1 tablet (8 mg total) by mouth every 8 (eight) hours as needed for nausea or vomiting. 09/28/21  Yes Nicholas Lose, MD  polyethylene glycol (MIRALAX / GLYCOLAX) 17 g packet Take 17 g by mouth daily.   Yes Nicholas Lose, MD  Accu-Chek FastClix Lancets MISC Check blood sugar fasting and before meals and again if pt feels bad (symptoms of hypo). 09/20/21   Charlott Rakes, MD  Blood Glucose Monitoring Suppl (ACCU-CHEK GUIDE) w/Device KIT 1 each by Does not apply route as directed. 08/25/18   Fulp, Cammie, MD  glucose blood (ACCU-CHEK GUIDE) test strip USE TO CHECK FASTING BLOOD SUGAR AND BEFORE MEALS AND AGAIN IF PATIENT FEELS BAD; SYMPTOMS OF HYPOGLYCEMIA 09/20/21   Charlott Rakes, MD  prochlorperazine (COMPAZINE) 10 MG tablet Take 1 tablet (10 mg total) by mouth every 6 (six) hours as needed for nausea or vomiting. Patient not taking: Reported on 02/15/2022 09/28/21   Nicholas Lose, MD      Georgann Housekeeper, AGACNP-BC [place] for personal pager PCCM on call pager [phone] until 7pm. Please call Elink 7p-7a. [phone]  02/19/2022 5:14 PM

## 2022-02-19 NOTE — ED Notes (Signed)
Pt received lunch tray 

## 2022-02-19 NOTE — ED Notes (Signed)
Pt temp 101.2 degrees F orally. RN is aware.

## 2022-02-20 ENCOUNTER — Inpatient Hospital Stay (HOSPITAL_COMMUNITY): Payer: Medicare HMO

## 2022-02-20 DIAGNOSIS — E785 Hyperlipidemia, unspecified: Secondary | ICD-10-CM

## 2022-02-20 DIAGNOSIS — J9601 Acute respiratory failure with hypoxia: Secondary | ICD-10-CM | POA: Diagnosis not present

## 2022-02-20 DIAGNOSIS — E1141 Type 2 diabetes mellitus with diabetic mononeuropathy: Secondary | ICD-10-CM

## 2022-02-20 DIAGNOSIS — I69351 Hemiplegia and hemiparesis following cerebral infarction affecting right dominant side: Secondary | ICD-10-CM

## 2022-02-20 DIAGNOSIS — Z79811 Long term (current) use of aromatase inhibitors: Secondary | ICD-10-CM

## 2022-02-20 DIAGNOSIS — C50919 Malignant neoplasm of unspecified site of unspecified female breast: Secondary | ICD-10-CM | POA: Diagnosis not present

## 2022-02-20 DIAGNOSIS — I152 Hypertension secondary to endocrine disorders: Secondary | ICD-10-CM

## 2022-02-20 DIAGNOSIS — C801 Malignant (primary) neoplasm, unspecified: Secondary | ICD-10-CM

## 2022-02-20 DIAGNOSIS — J869 Pyothorax without fistula: Secondary | ICD-10-CM

## 2022-02-20 DIAGNOSIS — R55 Syncope and collapse: Secondary | ICD-10-CM

## 2022-02-20 DIAGNOSIS — J910 Malignant pleural effusion: Secondary | ICD-10-CM | POA: Diagnosis not present

## 2022-02-20 DIAGNOSIS — N1832 Chronic kidney disease, stage 3b: Secondary | ICD-10-CM

## 2022-02-20 DIAGNOSIS — D638 Anemia in other chronic diseases classified elsewhere: Secondary | ICD-10-CM

## 2022-02-20 DIAGNOSIS — E1159 Type 2 diabetes mellitus with other circulatory complications: Secondary | ICD-10-CM

## 2022-02-20 DIAGNOSIS — E1169 Type 2 diabetes mellitus with other specified complication: Secondary | ICD-10-CM

## 2022-02-20 LAB — CBC
HCT: 25.8 % — ABNORMAL LOW (ref 36.0–46.0)
Hemoglobin: 8.1 g/dL — ABNORMAL LOW (ref 12.0–15.0)
MCH: 25.9 pg — ABNORMAL LOW (ref 26.0–34.0)
MCHC: 31.4 g/dL (ref 30.0–36.0)
MCV: 82.4 fL (ref 80.0–100.0)
Platelets: 245 10*3/uL (ref 150–400)
RBC: 3.13 MIL/uL — ABNORMAL LOW (ref 3.87–5.11)
RDW: 15.7 % — ABNORMAL HIGH (ref 11.5–15.5)
WBC: 6.6 10*3/uL (ref 4.0–10.5)
nRBC: 0 % (ref 0.0–0.2)

## 2022-02-20 LAB — GLUCOSE, CAPILLARY
Glucose-Capillary: 179 mg/dL — ABNORMAL HIGH (ref 70–99)
Glucose-Capillary: 128 mg/dL — ABNORMAL HIGH (ref 70–99)
Glucose-Capillary: 210 mg/dL — ABNORMAL HIGH (ref 70–99)
Glucose-Capillary: 145 mg/dL — ABNORMAL HIGH (ref 70–99)

## 2022-02-20 LAB — BLOOD GAS, ARTERIAL
Acid-base deficit: 1.7 mmol/L (ref 0.0–2.0)
Bicarbonate: 23 mmol/L (ref 20.0–28.0)
FIO2: 32 %
O2 Saturation: 95.7 %
Patient temperature: 36.9
pCO2 arterial: 38 mmHg (ref 32–48)
pH, Arterial: 7.39 (ref 7.35–7.45)
pO2, Arterial: 74 mmHg — ABNORMAL LOW (ref 83–108)

## 2022-02-20 LAB — BASIC METABOLIC PANEL
Anion gap: 6 (ref 5–15)
BUN: 28 mg/dL — ABNORMAL HIGH (ref 8–23)
CO2: 22 mmol/L (ref 22–32)
Calcium: 9.1 mg/dL (ref 8.9–10.3)
Chloride: 110 mmol/L (ref 98–111)
Creatinine, Ser: 1.61 mg/dL — ABNORMAL HIGH (ref 0.44–1.00)
GFR, Estimated: 33 mL/min — ABNORMAL LOW (ref >60)
Glucose, Bld: 153 mg/dL — ABNORMAL HIGH (ref 70–99)
Potassium: 4.4 mmol/L (ref 3.5–5.1)
Sodium: 138 mmol/L (ref 135–145)

## 2022-02-20 LAB — LACTATE DEHYDROGENASE, PLEURAL OR PERITONEAL FLUID: LD, Fluid: 186 U/L — ABNORMAL HIGH (ref 3–23)

## 2022-02-20 LAB — GLUCOSE, PLEURAL OR PERITONEAL FLUID: Glucose, Fluid: 137 mg/dL

## 2022-02-20 LAB — PROCALCITONIN: Procalcitonin: 0.26 ng/mL

## 2022-02-20 LAB — MAGNESIUM: Magnesium: 2.4 mg/dL (ref 1.7–2.4)

## 2022-02-20 MED ORDER — ENSURE ENLIVE PO LIQD
237.0000 mL | Freq: Two times a day (BID) | ORAL | Status: DC
  Administered 2022-02-22 – 2022-02-27 (×8): 237 mL via ORAL

## 2022-02-20 MED ORDER — DORNASE ALFA 2.5 MG/2.5ML IN SOLN
5.0000 mg | Freq: Once | RESPIRATORY_TRACT | Status: AC
  Administered 2022-02-20: 5 mg via INTRAPLEURAL
  Filled 2022-02-20: qty 5

## 2022-02-20 MED ORDER — ADULT MULTIVITAMIN W/MINERALS CH
1.0000 | ORAL_TABLET | Freq: Every day | ORAL | Status: DC
  Administered 2022-02-20 – 2022-03-01 (×9): 1 via ORAL
  Filled 2022-02-20 (×9): qty 1

## 2022-02-20 MED ORDER — ALTEPLASE 2 MG IJ SOLR
10.0000 mg | Freq: Once | INTRAMUSCULAR | Status: AC
  Administered 2022-02-20: 10 mg via INTRAPLEURAL
  Filled 2022-02-20 (×2): qty 10

## 2022-02-20 NOTE — Progress Notes (Signed)
OT Cancellation Note  Patient Details Name: Heather Rogers MRN: [id number] DOB: 02/12/1946   Cancelled Treatment:    Reason Eval/Treat Not Completed: Patient at procedure or test/ unavailable Nurse asking to hold off with chest tube being addressed at this time. OT to continue to follow and check back as schedule will allow.  Jackelyn Poling OTR/L, [place] Acute [facility] Department Office# [phone] Pager# [phone]  02/20/2022, 3:23 PM

## 2022-02-20 NOTE — Evaluation (Signed)
Physical Therapy Evaluation Patient Details Name: Heather Rogers MRN: [id number] DOB: 04-01-1946 Today's Date: 02/20/2022  History of Present Illness  76 yo female admitted with malignant pleural effusion s/p chest tube and pleural fibrinolysis 7/24. Hx of pleurx cath placement 09/07/21, CVA, DM, met breast cancer, CKD  Clinical Impression  On eval, pt required Min A for mobility. She walked ~15 feet x 2 with a RW. O2 89% on RA at rest in bed so reapplied 2l [place] O2 for activity. Pt presents with general weakness, decreased activity tolerance, and impaired gait and balance. Family was present during session. D/C plan is for pt to return home where she lives with her grandson who works. Will recommend HHPT f/u and RW for ambulation safety.        Recommendations for follow up therapy are one component of a multi-disciplinary discharge planning process, led by the attending physician.  Recommendations may be updated based on patient status, additional functional criteria and insurance authorization.  Follow Up Recommendations Home health PT      Assistance Recommended at Discharge Frequent or constant Supervision/Assistance  Patient can return home with the following  A little help with walking and/or transfers;A little help with bathing/dressing/bathroom;Assist for transportation;Assistance with cooking/housework;Help with stairs or ramp for entrance    Equipment Recommendations Rolling walker (2 wheels)  Recommendations for Other Services  OT consult    Functional Status Assessment Patient has had a recent decline in their functional status and demonstrates the ability to make significant improvements in function in a reasonable and predictable amount of time.     Precautions / Restrictions Precautions Precautions: Fall Precaution Comments: (L) chest tube Restrictions Weight Bearing Restrictions: No      Mobility  Bed Mobility Overal bed mobility: Needs Assistance Bed Mobility:  Supine to Sit     Supine to sit: Min guard, HOB elevated     General bed mobility comments: Moderate reliance on bedrail. Increased time and effort.    Transfers Overall transfer level: Needs assistance Equipment used: Rolling walker (2 wheels) Transfers: Sit to/from Stand Sit to Stand: Min assist           General transfer comment: x 2-once from bed, once from toilet. Assist to power up, stabilize, control descent. Cues for safety, technique, hand placement.    Ambulation/Gait Ambulation/Gait assistance: Min assist Gait Distance (Feet): 15 Feet (x2) Assistive device: Rolling walker (2 wheels) Gait Pattern/deviations: Step-through pattern, Decreased stride length       General Gait Details: Unsteady and fatigues fairly easily. Dyspnea 2/4 with some audible wheezing. O2 95% on 2L [place] O2.  Stairs            Wheelchair Mobility    Modified Rankin (Stroke Patients Only)       Balance Overall balance assessment: Needs assistance         Standing balance support: Bilateral upper extremity supported, During functional activity, Reliant on assistive device for balance Standing balance-Leahy Scale: Poor                               Pertinent Vitals/Pain Pain Assessment Pain Assessment: No/denies pain    Home Living Family/patient expects to be discharged to:: Private residence Living Arrangements: Other relatives Available Help at Discharge: Family;Available PRN/intermittently Type of Home: House Home Access: Ramped entrance       Home Layout: One level Home Equipment: BSC/3in1      Prior Function Prior  Level of Function : Independent/Modified Independent                     Hand Dominance        Extremity/Trunk Assessment   Upper Extremity Assessment Upper Extremity Assessment: Defer to OT evaluation    Lower Extremity Assessment Lower Extremity Assessment: Generalized weakness    Cervical / Trunk  Assessment Cervical / Trunk Assessment: Normal  Communication   Communication: No difficulties  Cognition Arousal/Alertness: Awake/alert Behavior During Therapy: WFL for tasks assessed/performed Overall Cognitive Status: Within Functional Limits for tasks assessed                                          General Comments      Exercises     Assessment/Plan    PT Assessment Patient needs continued PT services  PT Problem List Decreased strength;Decreased balance;Decreased activity tolerance;Decreased mobility;Decreased knowledge of use of DME       PT Treatment Interventions DME instruction;Gait training;Functional mobility training;Therapeutic activities;Balance training;Patient/family education;Therapeutic exercise    PT Goals (Current goals can be found in the Care Plan section)  Acute Rehab PT Goals Patient Stated Goal: to return home; to regain PLOF/independence PT Goal Formulation: With patient/family Time For Goal Achievement: 03/06/22 Potential to Achieve Goals: Good    Frequency Min 3X/week     Co-evaluation               AM-PAC PT "6 Clicks" Mobility  Outcome Measure Help needed turning from your back to your side while in a flat bed without using bedrails?: A Little Help needed moving from lying on your back to sitting on the side of a flat bed without using bedrails?: A Little Help needed moving to and from a bed to a chair (including a wheelchair)?: A Little Help needed standing up from a chair using your arms (e.g., wheelchair or bedside chair)?: A Little Help needed to walk in hospital room?: A Little Help needed climbing 3-5 steps with a railing? : A Lot 6 Click Score: 17    End of Session Equipment Utilized During Treatment: Oxygen Activity Tolerance: Patient limited by fatigue Patient left: in chair;with call bell/phone within reach;with family/visitor present   PT Visit Diagnosis: Unsteadiness on feet (R26.81);Muscle  weakness (generalized) (M62.81);Difficulty in walking, not elsewhere classified (R26.2)    Time: 1005-1030 PT Time Calculation (min) (ACUTE ONLY): 25 min   Charges:   PT Evaluation $PT Eval Moderate Complexity: 1 Mod PT Treatments $Gait Training: 8-22 mins           Doreatha Massed, PT Acute Rehabilitation  Office: [phone] Pager: [phone]

## 2022-02-20 NOTE — Progress Notes (Signed)
   02/20/22 0118  Assess: MEWS Score  Temp (!) 101.8 F (38.8 C)  BP 138/65  MAP (mmHg) 86  Pulse Rate 93  Resp 18  SpO2 93 %  O2 Device Room Air  Assess: MEWS Score  MEWS Temp 2  MEWS Systolic 0  MEWS Pulse 0  MEWS RR 0  MEWS LOC 0  MEWS Score 2  MEWS Score Color Yellow  Assess: if the MEWS score is Yellow or Red  Were vital signs taken at a resting state? Yes  Focused Assessment No change from prior assessment  Does the patient meet 2 or more of the SIRS criteria? No  MEWS guidelines implemented *See Row Information* Yes  Notify: Charge Nurse/RN  Name of Charge Nurse/RN Notified Vera, RN  Date Charge Nurse/RN Notified 02/20/22  Time Charge Nurse/RN Notified 0120  Assess: SIRS CRITERIA  SIRS Temperature  1  SIRS Pulse 1  SIRS Respirations  0  SIRS WBC 1  SIRS Score Sum  3

## 2022-02-20 NOTE — Progress Notes (Signed)
PROGRESS NOTE    Heather Rogers  GDJ:[id number] DOB: 1946-05-04 DOA: 02/18/2022 PCP: Charlott Rakes, MD   Brief Narrative:  Heather Rogers is a 76 y.o. female with past medical history significant for metastatic left breast cancer (on active treatment with [name] and letrozole) with malignant left pleural effusion post Pleurx catheter placement on 09/07/2021, history of CVA, CKD stage IIIb, anemia of chronic disease, diabetes mellitus type 2, essential hypertension, hyperlipidemia with fever and dyspnea.  She also passed out and had increased output from her Pleurx catheter.  Patient was noted to have fever of 101.8 F.  In the ED, vitals were stable.  WBC was 7.8.  COVID was negative.  CT head was negative, CTA chest showed large left pleural effusion with atelectasis and left breast mass with lymphadenopathy.  CT C-spine, abdomen pelvis was negative.  Patient was given IV vancomycin and cefepime and was admitted to the hospital for further evaluation and treatment.    Assessment & Plan:   Principal Problem:   Malignant left pleural effusion Active Problems:   Syncope   Acute respiratory failure with hypoxia (HCC)   Primary malignant neoplasm of breast with metastasis (HCC)   Chronic kidney disease, stage 3b (HCC)   Type 2 diabetes mellitus (A1c 6.4 on 2/5)    Hypertension associated with diabetes ([place])   History of CVA   Anemia of chronic disease   Hyperlipidemia associated with type 2 diabetes mellitus (HCC)  Assessment and Plan:  Malignant left pleural effusion Secondary to left breast cancer.  Patient had Pleurx catheter as outpatient since February 2023 and now with decreased output.  Has had fevers so was started on IV vancomycin and cefepime.  PCCM was consulted and patient underwent instillation of fibrinolytics on 02/19/2022.  Gram stain with no organisms.  Pleural fluid culture negative in 2 days.  Patient is still on 2 to 3 L of nasal cannula  Complicated pleural  effusion.  Possible superimposed pneumonia.  Temperature max of 101.8 F.  Blood cultures negative in 2 days.  Procalcitonin 0.2.  Continue vancomycin and cefepime.  Status post fibrinolytics x2   Acute respiratory failure with hypoxia  Patient was hypoxic with SPO2 88-90% while at rest and down to 86% with ambulation.  On nasal cannula oxygen   Syncope Likely vasovagal syncope.  Orthostatic vitals were negative.  Telemetry monitoring without significant arrhythmia.  Physical therapy has recommended.  Chronic kidney disease, stage 3b Continue to monitor BMP.   Primary malignant neoplasm of breast with metastasis Metastatic left breast cancer with malignant left pleural effusion and osseous involvement.  Follows up with oncology, Dr. Lindi Adie, on active treatment with letrozole and [name].  Continue to hold [name] and letrozole for now with suspicion for active infection   Type 2 diabetes mellitus (A1c 6.4 on 2/5)  Latest hemoglobin A1c of 6.4 on 09/03/2021.  Continue to hold metformin.  Continue sliding scale insulin   Hypertension associated with diabetes Continue amlodipine, Coreg, hydralazine.   Hyperlipidemia associated with type 2 diabetes mellitus  Continue atorvastatin.   Anemia of chronic disease Hemoglobin of 8.1.   History of CVA Continue home aspirin and statin.   DVT prophylaxis: Heparin subcu  Code Status: Full code  Family Communication:  No one at bedside  Disposition Plan: Home  Status is: Inpatient  Remains inpatient appropriate because: Need for IV antibiotics, Pleurx catheter status post fibrinolytics.   Consultants:  PCCM  Procedures:  Fibrinolytic treatment x1  Antimicrobials:  Anti-infectives (From admission,  onward)    Start     Dose/Rate Route Frequency Ordered Stop   02/20/22 1600  vancomycin (VANCOCIN) IVPB 1000 mg/200 mL premix  Status:  Discontinued        1,000 mg 200 mL/hr over 60 Minutes Intravenous Every 48 hours 02/18/22 2008  02/20/22 1153   02/19/22 1600  ceFEPIme (MAXIPIME) 2 g in sodium chloride 0.9 % 100 mL IVPB        2 g 200 mL/hr over 30 Minutes Intravenous Every 24 hours 02/18/22 2004     02/18/22 1530  ceFEPIme (MAXIPIME) 2 g in sodium chloride 0.9 % 100 mL IVPB        2 g 200 mL/hr over 30 Minutes Intravenous  Once 02/18/22 1517 02/18/22 1608   02/18/22 1530  vancomycin (VANCOCIN) IVPB 1000 mg/200 mL premix        1,000 mg 200 mL/hr over 60 Minutes Intravenous  Once 02/18/22 1526 02/18/22 1715       Subjective: Today, patient was seen and examined at bedside.  Patient feels a little better with breathing.  No dyspnea chest pain  Objective: Vitals:   02/20/22 0440 02/20/22 0500 02/20/22 0815 02/20/22 0819  BP: (!) 152/67  (!) 145/69 (!) 145/69  Pulse: 83   89  Resp: 18   18  Temp: 98.9 F (37.2 C)   98.5 F (36.9 C)  TempSrc: Oral   Oral  SpO2: 94%   97%  Weight:  66.5 kg    Height:        Intake/Output Summary (Last 24 hours) at 02/20/2022 1326 Last data filed at 02/20/2022 0800 Gross per 24 hour  Intake 580 ml  Output 1850 ml  Net -1270 ml   Filed Weights   02/18/22 1428 02/20/22 0500  Weight: 62.1 kg 66.5 kg    Physical examination: Body mass index is 26.8 kg/m.   General:  Average built, not in obvious distress, chronically ill, elderly female HENT:   No scleral pallor or icterus noted. Oral mucosa is moist.  On nasal cannula oxygen Chest: Decreased breath sounds on the left..  Left chest tube with under waterseal drainage. CVS: S1 &S2 heard. No murmur.  Regular rate and rhythm. Abdomen: Soft, nontender, nondistended.  Bowel sounds are heard.   Extremities: No cyanosis, clubbing or edema.  Peripheral pulses are palpable. Psych: Alert, awake and oriented, normal mood CNS:  No cranial nerve deficits.  Power equal in all extremities.   Skin: Warm and dry.  No rashes noted.   Data Reviewed: I have personally reviewed following labs and imaging studies  CBC: Recent Labs   Lab 02/15/22 1037 02/18/22 1515 02/19/22 0500 02/20/22 0524  WBC 4.6 7.8 6.4 6.6  NEUTROABS 2.9 6.0  --   --   HGB 9.3* 9.5* 8.1* 8.1*  HCT 28.5* 29.3* 25.5* 25.8*  MCV 79.4* 80.3 81.0 82.4  PLT 269 306 246 161    Basic Metabolic Panel: Recent Labs  Lab 02/15/22 1037 02/18/22 1515 02/19/22 0500 02/20/22 0524  NA 139 137 137 138  K 4.3 4.0 4.0 4.4  CL 106 106 109 110  CO2 28 24 20* 22  GLUCOSE 95 160* 105* 153*  BUN 20 28* 21 28*  CREATININE 1.48* 1.49* 1.41* 1.61*  CALCIUM 10.0 9.6 8.8* 9.1  MG  --   --   --  2.4    GFR: Estimated Creatinine Clearance: 26.6 mL/min (A) (by C-G formula based on SCr of 1.61 mg/dL (H)). Liver Function Tests:  Recent Labs  Lab 02/15/22 1037 02/18/22 1515  AST 16 18  ALT 12 15  ALKPHOS 59 50  BILITOT 0.3 0.3  PROT 7.2 7.5  ALBUMIN 4.0 3.6    No results for input(s): "LIPASE", "AMYLASE" in the last 168 hours. No results for input(s): "AMMONIA" in the last 168 hours. Coagulation Profile: No results for input(s): "INR", "PROTIME" in the last 168 hours. Cardiac Enzymes: No results for input(s): "CKTOTAL", "CKMB", "CKMBINDEX", "TROPONINI" in the last 168 hours. BNP (last 3 results) No results for input(s): "PROBNP" in the last 8760 hours. HbA1C: No results for input(s): "HGBA1C" in the last 72 hours. CBG: Recent Labs  Lab 02/19/22 1147 02/19/22 1829 02/19/22 2327 02/20/22 0816 02/20/22 1141  GLUCAP 216* 136* 159* 179* 128*    Lipid Profile: No results for input(s): "CHOL", "HDL", "LDLCALC", "TRIG", "CHOLHDL", "LDLDIRECT" in the last 72 hours. Thyroid Function Tests: No results for input(s): "TSH", "T4TOTAL", "FREET4", "T3FREE", "THYROIDAB" in the last 72 hours. Anemia Panel: No results for input(s): "VITAMINB12", "FOLATE", "FERRITIN", "TIBC", "IRON", "RETICCTPCT" in the last 72 hours. Sepsis Labs: Recent Labs  Lab 02/18/22 1538 02/18/22 1625 02/19/22 1059 02/20/22 0524  PROCALCITON  --   --  0.23 0.26   LATICACIDVEN 1.2 1.0  --   --      Recent Results (from the past 240 hour(s))  Blood culture (routine x 2)     Status: None (Preliminary result)   Collection Time: 02/18/22  3:14 PM   Specimen: BLOOD  Result Value Ref Range Status   Specimen Description   Final    BLOOD LEFT ANTECUBITAL Performed at [facility], [place] [address] [place], [place] [address]    Special Requests   Final    BOTTLES DRAWN AEROBIC AND ANAEROBIC Blood Culture results may not be optimal due to an inadequate volume of blood received in culture bottles Performed at [facility] [address] [place], [place] [address]    Culture   Final    NO GROWTH 2 DAYS Performed at [facility] [address], [place], [place] [address]    Report Status PENDING  Incomplete  Blood culture (routine x 2)     Status: None (Preliminary result)   Collection Time: 02/18/22  3:30 PM   Specimen: BLOOD  Result Value Ref Range Status   Specimen Description   Final    BLOOD LEFT ANTECUBITAL Performed at [facility] [address], [place], [place] [address]    Special Requests   Final    BLOOD Blood Culture adequate volume Performed at [facility] [address], [place], [place] [address]    Culture   Final    NO GROWTH 2 DAYS Performed at [facility] [address] [place], [place] [address]    Report Status PENDING  Incomplete  SARS Coronavirus 2 by RT PCR (hospital order, performed in [facility] hospital lab) *cepheid single result test*     Status: None   Collection Time: 02/18/22  3:30 PM   Specimen: Nasal Swab  Result Value Ref Range Status   SARS Coronavirus 2 by RT PCR NEGATIVE NEGATIVE Final    Comment: (NOTE) SARS-CoV-2 target nucleic acids are NOT DETECTED.  The SARS-CoV-2 RNA is generally detectable in upper and lower respiratory specimens during the acute phase of infection. The lowest concentration of  SARS-CoV-2 viral copies this assay can detect is 250 copies / mL. A negative result does  not preclude SARS-CoV-2 infection and should not be used as the sole basis for treatment or other patient management decisions.  A negative result may occur with improper specimen collection / handling, submission of specimen other than nasopharyngeal swab, presence of viral mutation(s) within the areas targeted by this assay, and inadequate number of viral copies (<250 copies / mL). A negative result must be combined with clinical observations, patient history, and epidemiological information.  Fact Sheet for Patients:   https://www.patel.info/  Fact Sheet for Healthcare Providers: https://hall.com/  This test is not yet approved or  cleared by the [place] FDA and has been authorized for detection and/or diagnosis of SARS-CoV-2 by FDA under an Emergency Use Authorization (EUA).  This EUA will remain in effect (meaning this test can be used) for the duration of the COVID-19 declaration under Section 564(b)(1) of the Act, 21 U.S.C. section 360bbb-3(b)(1), unless the authorization is terminated or revoked sooner.  Performed at [facility], [place] [address], [place], [place] [address]   Urine Culture     Status: None   Collection Time: 02/18/22  4:17 PM   Specimen: Urine, Clean Catch  Result Value Ref Range Status   Specimen Description   Final    URINE, CLEAN CATCH Performed at [facility], [place] [address] [place], [place] [address]    Special Requests   Final    NONE Performed at [facility], [place] [address] [place], [place] [address]    Culture   Final    NO GROWTH Performed at [facility], [place] [address], [place], [place] [address]    Report Status 02/19/2022 FINAL  Final  Culture, body fluid w Gram Stain-bottle     Status: None (Preliminary result)   Collection Time:  02/18/22  7:04 PM   Specimen: Fluid  Result Value Ref Range Status   Specimen Description FLUID PLEURAL  Final   Special Requests   Final    BOTTLES DRAWN AEROBIC AND ANAEROBIC Blood Culture adequate volume   Culture   Final    NO GROWTH 2 DAYS Performed at [facility], [place] [address] [place], [place] [address]    Report Status PENDING  Incomplete  Gram stain     Status: None   Collection Time: 02/18/22  7:04 PM   Specimen: Fluid  Result Value Ref Range Status   Specimen Description FLUID PLEURAL  Final   Special Requests NONE  Final   Gram Stain   Final    FEW WBC PRESENT,BOTH PMN AND MONONUCLEAR NO ORGANISMS SEEN Performed at [facility], [address] [place], [place] [address]    Report Status 02/19/2022 FINAL  Final  MRSA Next Gen by PCR, Nasal     Status: None   Collection Time: 02/19/22 12:32 PM   Specimen: Nasal Mucosa; Nasal Swab  Result Value Ref Range Status   MRSA by PCR Next Gen NOT DETECTED NOT DETECTED Final    Comment: (NOTE) The GeneXpert MRSA Assay (FDA approved for NASAL specimens only), is one component of a comprehensive MRSA colonization surveillance program. It is not intended to diagnose MRSA infection nor to guide or monitor treatment for MRSA infections. Test performance is not FDA approved in patients less than 13 years old. Performed at [facility], [place] [address], [place], [place] [address]      Radiology Studies: [facility] Chest Port 1 View  Result Date: 02/20/2022 CLINICAL DATA:  Malignant  left pleural effusion. EXAM: PORTABLE CHEST 1 VIEW COMPARISON:  Chest two views 02/18/2022, CT chest 02/18/2022 FINDINGS: Lung volumes are mildly decreased compared to prior. There is again homogeneous opacification of the majority of the inferior 2/3 of the left hemithorax corresponding to the lobulated and likely loculated moderate to large left pleural effusion better seen on prior CT. The right lung appears clear. No  pneumothorax is seen. The visualized portions of the cardiac silhouette and mediastinal contours are unchanged. Left basilar drainage catheter appears unchanged. IMPRESSION: Mildly decreased lung volumes compared to prior. Within this limitation, there is no significant change to possibly mild interval increase in the size of moderate-to-large loculated left pleural effusion better seen on prior CT. Electronically Signed   By: Yvonne Kendall M.D.   On: 02/20/2022 09:09   CT Angio Chest PE W and/or Wo Contrast  Result Date: 02/18/2022 CLINICAL DATA:  Pulmonary embolus suspected with high probability. History of metastatic breast cancer. Today vomiting and fever. Currently on oral chemotherapy. EXAM: CT ANGIOGRAPHY CHEST CT ABDOMEN AND PELVIS WITH CONTRAST TECHNIQUE: Multidetector CT imaging of the chest was performed using the standard protocol during bolus administration of intravenous contrast. Multiplanar CT image reconstructions and MIPs were obtained to evaluate the vascular anatomy. Multidetector CT imaging of the abdomen and pelvis was performed using the standard protocol during bolus administration of intravenous contrast. RADIATION DOSE REDUCTION: This exam was performed according to the departmental dose-optimization program which includes automated exposure control, adjustment of the mA and/or kV according to patient size and/or use of iterative reconstruction technique. CONTRAST:  39m OMNIPAQUE IOHEXOL 350 MG/ML SOLN COMPARISON:  PET-CT 09/29/2021.  CT chest 01/15/2022 FINDINGS: CTA CHEST FINDINGS Cardiovascular: Evaluation of the pulmonary arteries is limited by suboptimal contrast bolus and motion artifact. As visualized, there are no significant filling defects in the central pulmonary arteries suggesting no large central pulmonary embolus. Heart size is normal. No pericardial effusion. Normal caliber thoracic aorta. No aortic dissection. Calcification of the aorta and coronary arteries.  Mediastinum/Nodes: The esophagus is decompressed. Subcentimeter right thyroid gland nodule is unchanged since prior studies. Hilar, subcarinal, and left aortopulmonic window lymph nodes are similar to prior study with left AP window nodes measuring 1.6 cm in short axis dimension. This corresponds to known metastatic disease. Lungs/Pleura: There is a large left pleural effusion with somewhat nodular appearance likely representing a malignant effusion. This is significantly increased in size since the prior study despite presence of a chest drain. Atelectasis in the left lung. Right lung is clear. Airways are patent. Musculoskeletal: Degenerative changes in the spine. Heterogeneous areas of bone sclerosis and destruction in the sternum and in several midthoracic vertebra, likely representing bone metastasis. This corresponds to changes on prior PET-CT and CT. Large soft tissue mass in the left breast corresponding to known neoplasm. Skin thickening and infiltration could be neoplastic or radiation related. Lymphadenopathy in the left axilla consistent with metastatic disease. Review of the MIP images confirms the above findings. CT ABDOMEN and PELVIS FINDINGS Hepatobiliary: No focal liver abnormality is seen. No gallstones, gallbladder wall thickening, or biliary dilatation. Pancreas: Unremarkable. No pancreatic ductal dilatation or surrounding inflammatory changes. Spleen: Normal in size without focal abnormality. Adrenals/Urinary Tract: No adrenal gland nodules. Subcentimeter simple appearing cysts in both kidneys consistent with benign cyst. No imaging follow-up is indicated for these typically benign lesions. Renal nephrograms are symmetrical. No hydronephrosis or hydroureter. Bladder is normal. Stomach/Bowel: Stomach is within normal limits. Appendix appears normal. No evidence of bowel wall thickening,  distention, or inflammatory changes. Vascular/Lymphatic: Scattered lymph nodes in the celiac axis and  retroperitoneum without pathologic enlargement. Diffuse calcification of the aorta. No aneurysm. Reproductive: No abnormal pelvic masses. Other: No free air or free fluid in the abdomen. Abdominal wall musculature appears intact. Musculoskeletal: No destructive bone lesions. Review of the MIP images confirms the above findings. IMPRESSION: 1. Examination of the pulmonary arteries is technically limited but no significant central pulmonary embolus is identified. 2. Large left pleural effusion with left lung atelectasis demonstrates significant progression since previous study. A left chest tube is in place. 3. Metastatic lymphadenopathy in the mediastinum and hilar regions as well as bone metastases are unchanged. Left breast mass and left axillary lymphadenopathy again demonstrated. 4. No acute process demonstrated in the abdomen or pelvis. Electronically Signed   By: Lucienne Capers M.D.   On: 02/18/2022 17:58   CT ABDOMEN PELVIS W CONTRAST  Result Date: 02/18/2022 CLINICAL DATA:  Pulmonary embolus suspected with high probability. History of metastatic breast cancer. Today vomiting and fever. Currently on oral chemotherapy. EXAM: CT ANGIOGRAPHY CHEST CT ABDOMEN AND PELVIS WITH CONTRAST TECHNIQUE: Multidetector CT imaging of the chest was performed using the standard protocol during bolus administration of intravenous contrast. Multiplanar CT image reconstructions and MIPs were obtained to evaluate the vascular anatomy. Multidetector CT imaging of the abdomen and pelvis was performed using the standard protocol during bolus administration of intravenous contrast. RADIATION DOSE REDUCTION: This exam was performed according to the departmental dose-optimization program which includes automated exposure control, adjustment of the mA and/or kV according to patient size and/or use of iterative reconstruction technique. CONTRAST:  22m OMNIPAQUE IOHEXOL 350 MG/ML SOLN COMPARISON:  PET-CT 09/29/2021.  CT chest  01/15/2022 FINDINGS: CTA CHEST FINDINGS Cardiovascular: Evaluation of the pulmonary arteries is limited by suboptimal contrast bolus and motion artifact. As visualized, there are no significant filling defects in the central pulmonary arteries suggesting no large central pulmonary embolus. Heart size is normal. No pericardial effusion. Normal caliber thoracic aorta. No aortic dissection. Calcification of the aorta and coronary arteries. Mediastinum/Nodes: The esophagus is decompressed. Subcentimeter right thyroid gland nodule is unchanged since prior studies. Hilar, subcarinal, and left aortopulmonic window lymph nodes are similar to prior study with left AP window nodes measuring 1.6 cm in short axis dimension. This corresponds to known metastatic disease. Lungs/Pleura: There is a large left pleural effusion with somewhat nodular appearance likely representing a malignant effusion. This is significantly increased in size since the prior study despite presence of a chest drain. Atelectasis in the left lung. Right lung is clear. Airways are patent. Musculoskeletal: Degenerative changes in the spine. Heterogeneous areas of bone sclerosis and destruction in the sternum and in several midthoracic vertebra, likely representing bone metastasis. This corresponds to changes on prior PET-CT and CT. Large soft tissue mass in the left breast corresponding to known neoplasm. Skin thickening and infiltration could be neoplastic or radiation related. Lymphadenopathy in the left axilla consistent with metastatic disease. Review of the MIP images confirms the above findings. CT ABDOMEN and PELVIS FINDINGS Hepatobiliary: No focal liver abnormality is seen. No gallstones, gallbladder wall thickening, or biliary dilatation. Pancreas: Unremarkable. No pancreatic ductal dilatation or surrounding inflammatory changes. Spleen: Normal in size without focal abnormality. Adrenals/Urinary Tract: No adrenal gland nodules. Subcentimeter simple  appearing cysts in both kidneys consistent with benign cyst. No imaging follow-up is indicated for these typically benign lesions. Renal nephrograms are symmetrical. No hydronephrosis or hydroureter. Bladder is normal. Stomach/Bowel: Stomach is within normal limits.  Appendix appears normal. No evidence of bowel wall thickening, distention, or inflammatory changes. Vascular/Lymphatic: Scattered lymph nodes in the celiac axis and retroperitoneum without pathologic enlargement. Diffuse calcification of the aorta. No aneurysm. Reproductive: No abnormal pelvic masses. Other: No free air or free fluid in the abdomen. Abdominal wall musculature appears intact. Musculoskeletal: No destructive bone lesions. Review of the MIP images confirms the above findings. IMPRESSION: 1. Examination of the pulmonary arteries is technically limited but no significant central pulmonary embolus is identified. 2. Large left pleural effusion with left lung atelectasis demonstrates significant progression since previous study. A left chest tube is in place. 3. Metastatic lymphadenopathy in the mediastinum and hilar regions as well as bone metastases are unchanged. Left breast mass and left axillary lymphadenopathy again demonstrated. 4. No acute process demonstrated in the abdomen or pelvis. Electronically Signed   By: Lucienne Capers M.D.   On: 02/18/2022 17:58   CT Head Wo Contrast  Result Date: 02/18/2022 CLINICAL DATA:  History of metastatic breast cancer presents with vomiting and fever. Currently on oral chemotherapy. One episode emesis. EXAM: CT HEAD WITHOUT CONTRAST CT CERVICAL SPINE WITHOUT CONTRAST TECHNIQUE: Multidetector CT imaging of the head and cervical spine was performed following the standard protocol without intravenous contrast. Multiplanar CT image reconstructions of the cervical spine were also generated. RADIATION DOSE REDUCTION: This exam was performed according to the departmental dose-optimization program which  includes automated exposure control, adjustment of the mA and/or kV according to patient size and/or use of iterative reconstruction technique. COMPARISON:  Head CT 05/15/2018 FINDINGS: CT HEAD FINDINGS Brain: Ventricles, cisterns and other CSF spaces are within normal. Mild chronic ischemic microvascular disease. Old left occipital infarct. No mass, mass effect or shift of midline structures. No acute hemorrhage. No definite acute infarction. Vascular: No hyperdense vessel or unexpected calcification. Skull: Normal. Negative for fracture or focal lesion. Sinuses/Orbits: No acute finding. Other: None. CT CERVICAL SPINE FINDINGS Alignment: Normal. Skull base and vertebrae: Mild to moderate spondylosis throughout the cervical spine to include facet arthropathy and uncovertebral joint spurring. Atlantoaxial articulation is unremarkable. Minimal bilateral neural foraminal narrowing at the C5-6 level and mild left-sided neural foraminal narrowing at the C6-7 level. No acute fracture. Soft tissues and spinal canal: Prevertebral soft tissues are normal. No significant canal stenosis. Disc levels: Disc space narrowing at the C5-6 and C6-7 levels and to lesser extent at the C4-5 and C7-T1 levels. Upper chest: No acute findings. Other: None. IMPRESSION: 1. No acute brain injury. 2. 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 3. No acute cervical spine injury. 4. Mild to moderate spondylosis throughout the cervical spine with multilevel disc disease and neural foraminal narrowing as described. Electronically Signed   By: Marin Olp M.D.   On: 02/18/2022 15:12   CT Cervical Spine Wo Contrast  Result Date: 02/18/2022 CLINICAL DATA:  History of metastatic breast cancer presents with vomiting and fever. Currently on oral chemotherapy. One episode emesis. EXAM: CT HEAD WITHOUT CONTRAST CT CERVICAL SPINE WITHOUT CONTRAST TECHNIQUE: Multidetector CT imaging of the head and cervical spine was  performed following the standard protocol without intravenous contrast. Multiplanar CT image reconstructions of the cervical spine were also generated. RADIATION DOSE REDUCTION: This exam was performed according to the departmental dose-optimization program which includes automated exposure control, adjustment of the mA and/or kV according to patient size and/or use of iterative reconstruction technique. COMPARISON:  Head CT 05/15/2018 FINDINGS: CT HEAD FINDINGS Brain: Ventricles, cisterns and other CSF spaces are within  normal. Mild chronic ischemic microvascular disease. Old left occipital infarct. No mass, mass effect or shift of midline structures. No acute hemorrhage. No definite acute infarction. Vascular: No hyperdense vessel or unexpected calcification. Skull: Normal. Negative for fracture or focal lesion. Sinuses/Orbits: No acute finding. Other: None. CT CERVICAL SPINE FINDINGS Alignment: Normal. Skull base and vertebrae: Mild to moderate spondylosis throughout the cervical spine to include facet arthropathy and uncovertebral joint spurring. Atlantoaxial articulation is unremarkable. Minimal bilateral neural foraminal narrowing at the C5-6 level and mild left-sided neural foraminal narrowing at the C6-7 level. No acute fracture. Soft tissues and spinal canal: Prevertebral soft tissues are normal. No significant canal stenosis. Disc levels: Disc space narrowing at the C5-6 and C6-7 levels and to lesser extent at the C4-5 and C7-T1 levels. Upper chest: No acute findings. Other: None. IMPRESSION: 1. No acute brain injury. 2. 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 3. No acute cervical spine injury. 4. Mild to moderate spondylosis throughout the cervical spine with multilevel disc disease and neural foraminal narrowing as described. Electronically Signed   By: Marin Olp M.D.   On: 02/18/2022 15:12   DG Chest 2 View  Result Date: 02/18/2022 CLINICAL DATA:  fx/sob EXAM:  CHEST - 2 VIEW COMPARISON:  Chest x-ray 09/07/2021, CT chest 01/15/2022 FINDINGS: The heart and mediastinal contours are unchanged. Aortic calcification. Left upper lobe hazy airspace opacity. No pulmonary edema. Interval increase in size of a possibly loculated, moderate volume left pleural effusion. No right pleural effusion. No pneumothorax. No acute osseous abnormality. IMPRESSION: 1. Interval increase in size of a possibly loculated, moderate volume left pleural effusion. 2. Left upper lobe hazy airspace opacity may represent combination of atelectasis versus infection/inflammation. 3.  Aortic Atherosclerosis (ICD10-I70.0). Electronically Signed   By: Iven Finn M.D.   On: 02/18/2022 15:07    Scheduled Meds:  alteplase (CATHFLO ACTIVASE) 10 mg in sodium chloride (PF) 0.9 % 30 mL  10 mg Intrapleural Once   And   dornase alfa (PULMOZYME) 5 mg in sterile water (preservative free) 30 mL  5 mg Intrapleural Once   amLODipine  10 mg Oral Daily   aspirin EC  81 mg Oral Daily   atorvastatin  80 mg Oral QPM   carvedilol  25 mg Oral BID WC   feeding supplement  237 mL Oral BID BM   gabapentin  100 mg Oral TID   heparin  5,000 Units Subcutaneous Q8H   hydrALAZINE  50 mg Oral TID   insulin aspart  0-9 Units Subcutaneous TID WC   multivitamin with minerals  1 tablet Oral Daily   polyethylene glycol  17 g Oral Daily   sodium chloride flush  10 mL Intrapleural Q8H   sodium chloride flush  3 mL Intravenous Q12H   Continuous Infusions:  ceFEPime (MAXIPIME) IV 200 mL/hr at 02/20/22 0007     LOS: 2 days    Flora Lipps, MD [facility] If 7PM-7AM, please contact night-coverage [email or web address] 02/20/2022, 1:26 PM

## 2022-02-20 NOTE — Progress Notes (Signed)
Initial Nutrition Assessment  INTERVENTION:   -Ensure Plus High Protein po BID, each supplement provides 350 kcal and 20 grams of protein.   -Multivitamin with minerals daily  -Liberalize diet to regular to maximize menu options   NUTRITION DIAGNOSIS:   Increased nutrient needs related to chronic illness (malignant pleural effusion) as evidenced by estimated needs.  GOAL:   Patient will meet greater than or equal to 90% of their needs  MONITOR:   PO intake, Supplement acceptance, Labs, Weight trends, I & O's  REASON FOR ASSESSMENT:   Malnutrition Screening Tool    ASSESSMENT:   76 y.o. female with medical history significant for metastatic left breast cancer (on active treatment with [name] and letrozole) with malignant left pleural effusion s/p Pleurx catheter placement 09/07/2021, history of CVA, CKD stage IIIb, anemia of chronic disease, T2DM, HTN, HLD who is admitted with enlarging left malignant pleural effusion with concern for superimposed pneumonia.  Patient in room, eating lunch in chair. Pt's daughter and other family at bedside. Per daughter, pt was just given hydrocodone and pt is eating very slowly as a result. Typically has a good appetite and eats well. They do not limit pt's diet. Pt usually fixes her own breakfast and pt's daughter provides lunch and dinner. Pt drinks 2 Ensures daily as well.  Pt's daughter states she was told pt could not have certain foods over the phone when ordering meals, will liberalize diet to help maximize choices and pt's intakes.  Pt also takes a daily MVI, Vitamin D and hair/skin/nails supplement.  Per pt's daughter, pt has lost 50 lbs since 2019. Per weight records, weight has been stable recently.   Medications: Miralax  Labs reviewed: CBGs: 128-179   NUTRITION - FOCUSED PHYSICAL EXAM:  Pt trying to eat lunch before falling asleep, was just given hydrocodone.  Diet Order:   Diet Order             Diet Heart Room service  appropriate? Yes; Fluid consistency: Thin  Diet effective now                   EDUCATION NEEDS:   No education needs have been identified at this time  Skin:  Skin Assessment: Reviewed RN Assessment  Last BM:  7/23  Height:   Ht Readings from Last 1 Encounters:  02/18/22 '5\' 2"'$  (1.575 m)    Weight:   Wt Readings from Last 1 Encounters:  02/20/22 66.5 kg    BMI:  Body mass index is 26.8 kg/m.  Estimated Nutritional Needs:   Kcal:  1900-2100  Protein:  95-110g  Fluid:  2L/day  Clayton Bibles, MS, RD, LDN Inpatient Clinical Dietitian Contact information available via Amion

## 2022-02-20 NOTE — Consult Note (Signed)
NAME:  Heather Rogers, MRN:  [id number], DOB:  03/31/1946, LOS: 2 ADMISSION DATE:  02/18/2022, CONSULTATION DATE:  7/24 REFERRING MD:  Dr. Manuella Ghazi, CHIEF COMPLAINT:  SOB   BRIEF  76 year old female with past medical history as below, which is significant for CKD 3, diabetes, hypertension, metastatic breast cancer for which she is currently undergoing treatment with [name] and letrozole.  She has Pleurx catheter on the left which is followed by Dr. Verlee Monte in the pulmonary clinic.  She was last seen in April and at that time was draining the Pleurx every other day.  Output decreased and she was able to drain about every 4 days.  Over the course of a couple of weeks her output has significantly increased to the point where her daughter was draining every 2 to 3 days with outputs around 2-300.  On 7/23 the patient developed fever and suffered a syncopal episode for which she presented to [facility] emergency department.  Upon arrival to the emergency department she was hypoxic requiring 2 L of supplemental oxygen and was febrile to 101F.  Work-up in the emergency department included CT angiogram of the chest demonstrating large pleural effusion on the left with areas concerning for loculation. Pleurx vacuum bottle attached in ED and only drained a small amount of fluid. PCCM consulted for Pleurx and effusion management.    Pertinent  Medical History   has a past medical history of Chronic kidney disease, stage 3b ([place]), Diabetes ([place]), Hypertension, Pneumonia due to COVID-19 virus (07/25/2020), Primary malignant neoplasm of breast with metastasis ([place]) (09/14/2021), Stroke (cerebrum) ([place]), and Stroke ([place]) (05/15/2018).   Significant Hospital Events: Including procedures, antibiotic start and stop dates in addition to other pertinent events   02/18/2022  - wrosening left effusionloculation v , concern for obstruction of baseline left pleurx -> s/p ip fibrinolytucs 7/25 fluid culture  Interim History  / Subjective:   7/24 - 3L -  97%. New o2 need.  Per daughter - till Sunday 02/18/22 pleurx was draining well. Same day fever too. Then "passed out" came to ER -> found to have worsening effusion and loculated. Now has reponded to first dose intrapleural fibrinolytic but CXR looks unchnaged. Daughter feels she is a bit more tired today. FEbrile 101/.34F yesteday  Objective   Blood pressure (!) 145/69, pulse 89, temperature 98.5 F (36.9 C), temperature source Oral, resp. rate 18, height '5\' 2"'$  (1.575 m), weight 66.5 kg, SpO2 97 %.        Intake/Output Summary (Last 24 hours) at 02/20/2022 1056 Last data filed at 02/20/2022 0800 Gross per 24 hour  Intake 580 ml  Output 1850 ml  Net -1270 ml   Filed Weights   02/18/22 1428 02/20/22 0500  Weight: 62.1 kg 66.5 kg    Examination: General Appearance:  Looks stable. Sitting in chair. REsting Head:  Normocephalic, without obvious abnormality, atraumatic Eyes:  PERRL - yes, conjunctiva/corneas - muddy     Ears:  Normal external ear canals, both ears Nose:  G tube - no bu thas [place] Throat:  ETT TUBE - no , OG tube - no Neck:  Supple,  No enlargement/tenderness/nodules Lungs: Clear to auscultation bilaterally, Heart:  S1 and S2 normal, no murmur, CVP - no.  Pressors - no Abdomen:  Soft, no masses, no organomegaly Genitalia / Rectal:  Not done Extremities:  Extremities- intact Skin:  ntact in exposed areas . Neurologic:  Sedation - none -> RASS - 0 . Moves all 4s - yes.  CAM-ICU - neg . Orientation - x3+     Resolved Hospital Problem list     Assessment & Plan:   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 Pleurx placed in February 2023 Left empyema of above - Present on Admit (worsening loculation + fever) with loss of drainage - s/p first dose IP lytuc  7/25  - febrile as of yesterday. Responding to IP lytic x dose 1   Plan  - dose # 2 of IP lytic today 02/20/22 - abx as  below      Acute hypoxemic respiratory failure secondary to effusion and likely pneumonia.   7/25- still needing 2-3L [place]  Plan - check ABG - Continue empiric antibiotics - Supplemental oxygen to keep O2 sats > 92% - Blood culture pending  Metastatic breast Ca - Holding oral chemotherapy  Remainder per primary team  Best Practice (right click and "Reselect all SmartList Selections" daily)   Daughter update at bedside  Anti-infectives (From admission, onward)    Start     Dose/Rate Route Frequency Ordered Stop   02/20/22 1600  vancomycin (VANCOCIN) IVPB 1000 mg/200 mL premix        1,000 mg 200 mL/hr over 60 Minutes Intravenous Every 48 hours 02/18/22 2008     02/19/22 1600  ceFEPIme (MAXIPIME) 2 g in sodium chloride 0.9 % 100 mL IVPB        2 g 200 mL/hr over 30 Minutes Intravenous Every 24 hours 02/18/22 2004     02/18/22 1530  ceFEPIme (MAXIPIME) 2 g in sodium chloride 0.9 % 100 mL IVPB        2 g 200 mL/hr over 30 Minutes Intravenous  Once 02/18/22 1517 02/18/22 1608   02/18/22 1530  vancomycin (VANCOCIN) IVPB 1000 mg/200 mL premix        1,000 mg 200 mL/hr over 60 Minutes Intravenous  Once 02/18/22 1526 02/18/22 1715        SIGNATURE    Dr. Brand Males, M.D., F.C.C.P,  Pulmonary and Critical Care Medicine Staff Physician, [place] Director - Interstitial Lung Disease  Program  Medical Director - [facility] [place] at [place], [place], [address]  NPI Number:  NPI #[id number] DEA Number: [id number]  Pager: [phone], If no answer  -> Check AMION or Try [phone] Telephone (clinical office): [phone] Telephone (research): [phone]  11:36 AM 02/20/2022    Labs   CBC: Recent Labs  Lab 02/15/22 1037 02/18/22 1515 02/19/22 0500 02/20/22 0524  WBC 4.6 7.8 6.4 6.6  NEUTROABS 2.9 6.0  --   --   HGB 9.3* 9.5* 8.1* 8.1*  HCT 28.5* 29.3* 25.5* 25.8*   MCV 79.4* 80.3 81.0 82.4  PLT 269 306 246 497    Basic Metabolic Panel: Recent Labs  Lab 02/15/22 1037 02/18/22 1515 02/19/22 0500 02/20/22 0524  NA 139 137 137 138  K 4.3 4.0 4.0 4.4  CL 106 106 109 110  CO2 28 24 20* 22  GLUCOSE 95 160* 105* 153*  BUN 20 28* 21 28*  CREATININE 1.48* 1.49* 1.41* 1.61*  CALCIUM 10.0 9.6 8.8* 9.1  MG  --   --   --  2.4   GFR: Estimated Creatinine Clearance: 26.6 mL/min (A) (by C-G formula based on SCr of 1.61 mg/dL (H)). Recent Labs  Lab 02/15/22 1037 02/18/22 1515 02/18/22 1538 02/18/22 1625 02/19/22  0500 02/19/22 1059 02/20/22 0524  PROCALCITON  --   --   --   --   --  0.23 0.26  WBC 4.6 7.8  --   --  6.4  --  6.6  LATICACIDVEN  --   --  1.2 1.0  --   --   --     Liver Function Tests: Recent Labs  Lab 02/15/22 1037 02/18/22 1515  AST 16 18  ALT 12 15  ALKPHOS 59 50  BILITOT 0.3 0.3  PROT 7.2 7.5  ALBUMIN 4.0 3.6   No results for input(s): "LIPASE", "AMYLASE" in the last 168 hours. No results for input(s): "AMMONIA" in the last 168 hours.  ABG    Component Value Date/Time   TCO2 20 (L) 05/15/2018 0856     Coagulation Profile: No results for input(s): "INR", "PROTIME" in the last 168 hours.  Cardiac Enzymes: No results for input(s): "CKTOTAL", "CKMB", "CKMBINDEX", "TROPONINI" in the last 168 hours.  HbA1C: Hemoglobin A1C  Date/Time Value Ref Range Status  05/22/2021 11:54 AM 6.2 (A) 4.0 - 5.6 % Final   HbA1c, POC (controlled diabetic range)  Date/Time Value Ref Range Status  01/08/2022 10:41 AM 5.5 0.0 - 7.0 % Final  11/17/2020 11:18 AM 6.7 0.0 - 7.0 % Final   Hgb A1c MFr Bld  Date/Time Value Ref Range Status  09/03/2021 12:37 AM 6.4 (H) 4.8 - 5.6 % Final    Comment:    (NOTE) Pre diabetes:          5.7%-6.4%  Diabetes:              >6.4%  Glycemic control for   <7.0% adults with diabetes     CBG: Recent Labs  Lab 02/19/22 0742 02/19/22 1147 02/19/22 1829 02/19/22 2327 02/20/22 0816   GLUCAP 105* 216* 136* 159* 179*

## 2022-02-20 NOTE — Procedures (Signed)
Pleural Fibrinolytic Administration Procedure Note  Heather Rogers  [id number]  Apr 24, 1946  Date:02/20/22  Time:4:07 PM   Provider Performing:Zyniah Ferraiolo W Heber Bernie   Procedure: Pleural Fibrinolysis Subsequent day [phone])  Indication(s) Fibrinolysis of complicated pleural effusion  Consent Risks of the procedure as well as the alternatives and risks of each were explained to the patient and/or caregiver.  Consent for the procedure was obtained.   Anesthesia None   Time Out Verified patient identification, verified procedure, site/side was marked, verified correct patient position, special equipment/implants available, medications/allergies/relevant history reviewed, required imaging and test results available.   Sterile Technique Hand hygiene, gloves   Procedure Description Existing pleural catheter was cleaned and accessed in sterile manner.  '10mg'$  of tPA in 30cc of saline and '5mg'$  of dornase in 30cc of sterile water were injected into pleural space using existing pleural catheter.  Catheter will be clamped for 1 hour and then placed back to suction.   Complications/Tolerance None; patient tolerated the procedure well.  EBL None   Specimen(s) None   Georgann Housekeeper, AGACNP-BC [facility]  See Amion for personal pager PCCM on call pager [phone] until 7pm. Please call Elink 7p-7a. [phone]  02/20/2022 4:07 PM

## 2022-02-20 NOTE — Plan of Care (Signed)
  Problem: Education: Goal: Ability to describe self-care measures that may prevent or decrease complications (Diabetes Survival Skills Education) will improve Outcome: Progressing Goal: Individualized Educational Video(s) Outcome: Progressing   Problem: Coping: Goal: Ability to adjust to condition or change in health will improve Outcome: Progressing   Problem: Fluid Volume: Goal: Ability to maintain a balanced intake and output will improve Outcome: Progressing   Problem: Health Behavior/Discharge Planning: Goal: Ability to identify and utilize available resources and services will improve Outcome: Progressing Goal: Ability to manage health-related needs will improve Outcome: Progressing   Problem: Metabolic: Goal: Ability to maintain appropriate glucose levels will improve Outcome: Progressing   Problem: Nutritional: Goal: Maintenance of adequate nutrition will improve Outcome: Progressing Goal: Progress toward achieving an optimal weight will improve Outcome: Progressing   Problem: Skin Integrity: Goal: Risk for impaired skin integrity will decrease Outcome: Progressing   Problem: Tissue Perfusion: Goal: Adequacy of tissue perfusion will improve Outcome: Progressing   Problem: Education: Goal: Knowledge of General Education information will improve Description: Including pain rating scale, medication(s)/side effects and non-pharmacologic comfort measures Outcome: Progressing   Problem: Health Behavior/Discharge Planning: Goal: Ability to manage health-related needs will improve Outcome: Progressing   Problem: Clinical Measurements: Goal: Ability to maintain clinical measurements within normal limits will improve Outcome: Progressing Goal: Will remain free from infection Outcome: Progressing Goal: Diagnostic test results will improve Outcome: Progressing Goal: Respiratory complications will improve Outcome: Progressing Goal: Cardiovascular complication will  be avoided Outcome: Progressing   Problem: Activity: Goal: Risk for activity intolerance will decrease Outcome: Progressing   Problem: Nutrition: Goal: Adequate nutrition will be maintained Outcome: Progressing   Problem: Coping: Goal: Level of anxiety will decrease Outcome: Progressing   Problem: Pain Managment: Goal: General experience of comfort will improve Outcome: Progressing   Problem: Safety: Goal: Ability to remain free from injury will improve Outcome: Progressing   Problem: Skin Integrity: Goal: Risk for impaired skin integrity will decrease Outcome: Progressing   

## 2022-02-21 ENCOUNTER — Inpatient Hospital Stay (HOSPITAL_COMMUNITY): Payer: Medicare HMO

## 2022-02-21 ENCOUNTER — Other Ambulatory Visit: Payer: Self-pay | Admitting: Family Medicine

## 2022-02-21 ENCOUNTER — Encounter (HOSPITAL_COMMUNITY): Payer: Self-pay | Admitting: Internal Medicine

## 2022-02-21 DIAGNOSIS — I672 Cerebral atherosclerosis: Secondary | ICD-10-CM

## 2022-02-21 DIAGNOSIS — Z8673 Personal history of transient ischemic attack (TIA), and cerebral infarction without residual deficits: Secondary | ICD-10-CM

## 2022-02-21 DIAGNOSIS — J910 Malignant pleural effusion: Secondary | ICD-10-CM | POA: Diagnosis not present

## 2022-02-21 LAB — BASIC METABOLIC PANEL
Anion gap: 7 (ref 5–15)
BUN: 38 mg/dL — ABNORMAL HIGH (ref 8–23)
CO2: 21 mmol/L — ABNORMAL LOW (ref 22–32)
Calcium: 9.1 mg/dL (ref 8.9–10.3)
Chloride: 109 mmol/L (ref 98–111)
Creatinine, Ser: 1.95 mg/dL — ABNORMAL HIGH (ref 0.44–1.00)
GFR, Estimated: 26 mL/min — ABNORMAL LOW (ref >60)
Glucose, Bld: 169 mg/dL — ABNORMAL HIGH (ref 70–99)
Potassium: 4.8 mmol/L (ref 3.5–5.1)
Sodium: 137 mmol/L (ref 135–145)

## 2022-02-21 LAB — CBC
HCT: 25.1 % — ABNORMAL LOW (ref 36.0–46.0)
Hemoglobin: 8.1 g/dL — ABNORMAL LOW (ref 12.0–15.0)
MCH: 26 pg (ref 26.0–34.0)
MCHC: 32.3 g/dL (ref 30.0–36.0)
MCV: 80.7 fL (ref 80.0–100.0)
Platelets: 249 10*3/uL (ref 150–400)
RBC: 3.11 MIL/uL — ABNORMAL LOW (ref 3.87–5.11)
RDW: 15 % (ref 11.5–15.5)
WBC: 6.8 10*3/uL (ref 4.0–10.5)
nRBC: 0 % (ref 0.0–0.2)

## 2022-02-21 LAB — GLUCOSE, CAPILLARY
Glucose-Capillary: 156 mg/dL — ABNORMAL HIGH (ref 70–99)
Glucose-Capillary: 167 mg/dL — ABNORMAL HIGH (ref 70–99)
Glucose-Capillary: 138 mg/dL — ABNORMAL HIGH (ref 70–99)
Glucose-Capillary: 233 mg/dL — ABNORMAL HIGH (ref 70–99)

## 2022-02-21 LAB — GLUCOSE, RANDOM: Glucose, Bld: 255 mg/dL — ABNORMAL HIGH (ref 70–99)

## 2022-02-21 LAB — MAGNESIUM: Magnesium: 2.7 mg/dL — ABNORMAL HIGH (ref 1.7–2.4)

## 2022-02-21 LAB — PROCALCITONIN: Procalcitonin: 0.51 ng/mL

## 2022-02-21 MED ORDER — FENTANYL CITRATE (PF) 100 MCG/2ML IJ SOLN
INTRAMUSCULAR | Status: AC
  Filled 2022-02-21: qty 2

## 2022-02-21 MED ORDER — FENTANYL CITRATE (PF) 100 MCG/2ML IJ SOLN
INTRAMUSCULAR | Status: AC | PRN
  Administered 2022-02-21: 25 ug via INTRAVENOUS

## 2022-02-21 MED ORDER — SODIUM CHLORIDE 0.9 % IV SOLN: INTRAVENOUS | Status: DC

## 2022-02-21 MED ORDER — SODIUM CHLORIDE (PF) 0.9 % IJ SOLN: 10.0000 mg | Freq: Once | INTRAMUSCULAR | Status: DC

## 2022-02-21 MED ORDER — LIDOCAINE HCL 1 % IJ SOLN
INTRAMUSCULAR | Status: AC | PRN
  Administered 2022-02-21: 10 mL via INTRADERMAL

## 2022-02-21 MED ORDER — NALOXONE HCL 0.4 MG/ML IJ SOLN
INTRAMUSCULAR | Status: AC
  Filled 2022-02-21: qty 1

## 2022-02-21 MED ORDER — STERILE WATER FOR INJECTION IJ SOLN: 5.0000 mg | Freq: Once | RESPIRATORY_TRACT | Status: DC

## 2022-02-21 NOTE — Progress Notes (Signed)
Ne chest tube has drained 1.2L of hemorrhagic fluids Per Daugher dyspnea better Old pleurx still not draining  Plan  - get cell count, chemistry and culture and cytology from new chest tube -> decide on further IP lytics  - get cxr 02/21/2022    SIGNATURE    Dr. Brand Males, M.D., F.C.C.P,  Pulmonary and Critical Care Medicine Staff Physician, [place] Director - Interstitial Lung Disease  Program  Medical Director - [facility] [place] at [place], [place], [address]  NPI Number:  NPI #[id number] DEA Number: [id number]  Pager: [phone], If no answer  -[place] or Try [phone] Telephone (clinical office): [phone] Telephone (research): [phone]  3:54 PM 02/21/2022

## 2022-02-21 NOTE — Procedures (Signed)
Interventional Radiology Procedure Note  Procedure: 10.2 fr left chest tube placed with CT guidance  Indication: Loculated left pleural effusion  Findings: Please refer to procedural dictation for full description.  Complications: None  EBL: < 10 mL  Miachel Roux, MD [phone]

## 2022-02-21 NOTE — Progress Notes (Signed)
PROGRESS NOTE    Heather Rogers  IHK:[id number] DOB: [birth year] DOA: 02/18/2022 PCP: Charlott Rakes, MD   Brief Narrative: 76 year old with past medical history significant for metastatic left breast cancer (on active treatment with [name] and letrozole), with malignant left pleural effusion post Pleurx catheter placement on 07/07/2022, history of CVA, CKD stage IIIb, anemia of chronic disease, diabetes type 2, hypertension, hyperlipidemia presented with fevers and dyspnea.  She also had a syncope episode and reported increased output from her Pleurx catheter.  Patient was noted to have fever of 101.  CT head was negative.  CTA chest showed a large left pleural effusion with atelectasis and new left breast mass with lymphadenopathy.  CT spine, CT abdomen and pelvis was negative.  Patient was a started on IV vancomycin and cefepime.  Subsequently vancomycin was discontinued.  CCM was consulted and patient underwent instillation of fibrinolytics on 7/24.  Chest x-ray repeated 7/26 showed complete opacification of the left lung.  IR consulted for left side chest tube placement.    Assessment & Plan:   Principal Problem:   Malignant left pleural effusion Active Problems:   Syncope   Acute respiratory failure with hypoxia (HCC)   Primary malignant neoplasm of breast with metastasis (HCC)   Chronic kidney disease, stage 3b (HCC)   Type 2 diabetes mellitus (A1c 6.4 on 2/5)    Hypertension associated with diabetes ([place])   History of CVA   Anemia of chronic disease   Hyperlipidemia associated with type 2 diabetes mellitus (HCC)   1-Malignant left pleural effusion Left side Complicated, loculated pleural effusion.  Acute hypoxic respiratory failure with hypoxia secondary to pleural effusion. #2 left breast cancer.  Patient had a Pleurx catheter as an outpatient since February 2023, started to have decreased output. -Presented with fevers.  CCM consulted and underwent instillation of  fibrinolytics on 7/24. -Continue with IV cefepime -Pleural fluid cultures: Negative -Continue with oxygen supplementation 3 L -Chest x-ray; 7/26 complete opacification of the left side -IR has been consulted for chest tube placement  2-AKI on CKD 3 b:  -Plan creatinine 1.2-1.49 -Worsening renal function today, creatinine peaked to 1.9 -Plan to check UA -Renal ultrasound -Start IV fluid -Strict I and o.   3-Primary malignant neoplasm of breast with metastasis Metastatic left breast cancer with malignant left pleural effusion and osseous involvement. Follow-up with Dr. Payton Mccallum In active treatment with letrozole and Versenio.  Holding due to active infection.   4-Diabetes type 2: Hemoglobin A1c 6.4 Continue to hold metformin while inpatient. Sliding scale insulin  Hypertension associated with diabetes: Continue with amlodipine Coreg and hydralazine  Hyperlipidemia associated with type 2 diabetes: Continue with atorvastatin  Anemia of chronic disease: Monitor hemoglobin History of CVA: Continue with aspirin and statin     syncope: Likely vasovagal.  Orthostatic negative.  No significant arrhythmia on telemetry     Nutrition Problem: Increased nutrient needs Etiology: chronic illness (malignant pleural effusion)    Signs/Symptoms: estimated needs    Interventions: Ensure Enlive (each supplement provides 350kcal and 20 grams of protein), MVI, Liberalize Diet  Estimated body mass index is 19.19 kg/m as calculated from the following:   Height as of this encounter: '5\' 2"'$  (1.575 m).   Weight as of this encounter: 47.6 kg.   DVT prophylaxis: Heparin  Code Status: Full code Family Communication: care discussed with patient Disposition Plan:  Status is: Inpatient Remains inpatient appropriate because: management of loculated pleural effusion.     Consultants:  CCM  Procedures:  Chest tube placement 7/26  Antimicrobials:  Cefepime  Subjective: She denies  worsening dyspnea. She had BM. She reporter nausea.   Objective: Vitals:   02/20/22 2316 02/21/22 0547 02/21/22 0601 02/21/22 0746  BP: 138/67 (!) 148/72  117/60  Pulse: 94 94  96  Resp: 18 16  (!) 24  Temp: (!) 100.7 F (38.2 C) (!) 100.4 F (38 C)  (!) 100.9 F (38.3 C)  TempSrc: Oral Oral  Oral  SpO2: 92% 94%  96%  Weight:   47.6 kg   Height:        Intake/Output Summary (Last 24 hours) at 02/21/2022 0757 Last data filed at 02/20/2022 2327 Gross per 24 hour  Intake 493 ml  Output --  Net 493 ml   Filed Weights   02/18/22 1428 02/20/22 0500 02/21/22 0601  Weight: 62.1 kg 66.5 kg 47.6 kg    Examination:  General exam: Appears calm and comfortable  Respiratory system: decreased breath sound left side Cardiovascular system: S1 & S2 heard, RRR.  Gastrointestinal system: Abdomen is nondistended, soft and nontender. No organomegaly or masses felt. Normal bowel sounds heard. Central nervous system: Alert and oriented.  Extremities: Symmetric 5 x 5 power.   Data Reviewed: I have personally reviewed following labs and imaging studies  CBC: Recent Labs  Lab 02/15/22 1037 02/18/22 1515 02/19/22 0500 02/20/22 0524 02/21/22 0538  WBC 4.6 7.8 6.4 6.6 6.8  NEUTROABS 2.9 6.0  --   --   --   HGB 9.3* 9.5* 8.1* 8.1* 8.1*  HCT 28.5* 29.3* 25.5* 25.8* 25.1*  MCV 79.4* 80.3 81.0 82.4 80.7  PLT 269 306 246 245 628   Basic Metabolic Panel: Recent Labs  Lab 02/15/22 1037 02/18/22 1515 02/19/22 0500 02/20/22 0524 02/21/22 0538  NA 139 137 137 138 137  K 4.3 4.0 4.0 4.4 4.8  CL 106 106 109 110 109  CO2 28 24 20* 22 21*  GLUCOSE 95 160* 105* 153* 169*  BUN 20 28* 21 28* 38*  CREATININE 1.48* 1.49* 1.41* 1.61* 1.95*  CALCIUM 10.0 9.6 8.8* 9.1 9.1  MG  --   --   --  2.4 2.7*   GFR: Estimated Creatinine Clearance: 18.4 mL/min (A) (by C-G formula based on SCr of 1.95 mg/dL (H)). Liver Function Tests: Recent Labs  Lab 02/15/22 1037 02/18/22 1515  AST 16 18  ALT 12  15  ALKPHOS 59 50  BILITOT 0.3 0.3  PROT 7.2 7.5  ALBUMIN 4.0 3.6   No results for input(s): "LIPASE", "AMYLASE" in the last 168 hours. No results for input(s): "AMMONIA" in the last 168 hours. Coagulation Profile: No results for input(s): "INR", "PROTIME" in the last 168 hours. Cardiac Enzymes: No results for input(s): "CKTOTAL", "CKMB", "CKMBINDEX", "TROPONINI" in the last 168 hours. BNP (last 3 results) No results for input(s): "PROBNP" in the last 8760 hours. HbA1C: No results for input(s): "HGBA1C" in the last 72 hours. CBG: Recent Labs  Lab 02/20/22 0816 02/20/22 1141 02/20/22 1652 02/20/22 2155 02/21/22 0735  GLUCAP 179* 128* 210* 145* 156*   Lipid Profile: No results for input(s): "CHOL", "HDL", "LDLCALC", "TRIG", "CHOLHDL", "LDLDIRECT" in the last 72 hours. Thyroid Function Tests: No results for input(s): "TSH", "T4TOTAL", "FREET4", "T3FREE", "THYROIDAB" in the last 72 hours. Anemia Panel: No results for input(s): "VITAMINB12", "FOLATE", "FERRITIN", "TIBC", "IRON", "RETICCTPCT" in the last 72 hours. Sepsis Labs: Recent Labs  Lab 02/18/22 1538 02/18/22 1625 02/19/22 1059 02/20/22 0524 02/21/22 0538  PROCALCITON  --   --  0.23 0.26 0.51  LATICACIDVEN 1.2 1.0  --   --   --     Recent Results (from the past 240 hour(s))  Blood culture (routine x 2)     Status: None (Preliminary result)   Collection Time: 02/18/22  3:14 PM   Specimen: BLOOD  Result Value Ref Range Status   Specimen Description   Final    BLOOD LEFT ANTECUBITAL Performed at [facility] [address], [place], [place] [address]    Special Requests   Final    BOTTLES DRAWN AEROBIC AND ANAEROBIC Blood Culture results may not be optimal due to an inadequate volume of blood received in culture bottles Performed at [facility] [address] [place], [place] [address]    Culture   Final    NO GROWTH 2 DAYS Performed at [facility]  [address], [place], [place] [address]    Report Status PENDING  Incomplete  Blood culture (routine x 2)     Status: None (Preliminary result)   Collection Time: 02/18/22  3:30 PM   Specimen: BLOOD  Result Value Ref Range Status   Specimen Description   Final    BLOOD LEFT ANTECUBITAL Performed at [facility] [address], [place], [place] [address]    Special Requests   Final    BLOOD Blood Culture adequate volume Performed at [facility] [address], [place], [place] [address]    Culture   Final    NO GROWTH 2 DAYS Performed at [facility] [address], [place], [place] [address]    Report Status PENDING  Incomplete  SARS Coronavirus 2 by RT PCR (hospital order, performed in [facility] hospital lab) *cepheid single result test*     Status: None   Collection Time: 02/18/22  3:30 PM   Specimen: Nasal Swab  Result Value Ref Range Status   SARS Coronavirus 2 by RT PCR NEGATIVE NEGATIVE Final    Comment: (NOTE) SARS-CoV-2 target nucleic acids are NOT DETECTED.  The SARS-CoV-2 RNA is generally detectable in upper and lower respiratory specimens during the acute phase of infection. The lowest concentration of SARS-CoV-2 viral copies this assay can detect is 250 copies / mL. A negative result does not preclude SARS-CoV-2 infection and should not be used as the sole basis for treatment or other patient management decisions.  A negative result may occur with improper specimen collection / handling, submission of specimen other than nasopharyngeal swab, presence of viral mutation(s) within the areas targeted by this assay, and inadequate number of viral copies (<250 copies / mL). A negative result must be combined with clinical observations, patient history, and epidemiological information.  Fact Sheet for Patients:   https://www.patel.info/  Fact Sheet for Healthcare  Providers: https://hall.com/  This test is not yet approved or  cleared by the [place] FDA and has been authorized for detection and/or diagnosis of SARS-CoV-2 by FDA under an Emergency Use Authorization (EUA).  This EUA will remain in effect (meaning this test can be used) for the duration of the COVID-19 declaration under Section 564(b)(1) of the Act, 21 U.S.C. section 360bbb-3(b)(1), unless the authorization is terminated or revoked sooner.  Performed at [facility], [place] [address], [place],  [address]   Urine Culture     Status: None   Collection Time: 02/18/22  4:17 PM   Specimen: Urine, Clean Catch  Result Value Ref Range Status  Specimen Description   Final    URINE, CLEAN CATCH Performed at [facility], [place] [address], [place], [place] [address]    Special Requests   Final    NONE Performed at [facility], [place] [address] [place], [place] [address]    Culture   Final    NO GROWTH Performed at [facility], [place] [address] [place], [place] [address]    Report Status 02/19/2022 FINAL  Final  Culture, body fluid w Gram Stain-bottle     Status: None (Preliminary result)   Collection Time: 02/18/22  7:04 PM   Specimen: Fluid  Result Value Ref Range Status   Specimen Description FLUID PLEURAL  Final   Special Requests   Final    BOTTLES DRAWN AEROBIC AND ANAEROBIC Blood Culture adequate volume   Culture   Final    NO GROWTH 2 DAYS Performed at [facility], [place] [address] [place], [place] [address]    Report Status PENDING  Incomplete  Gram stain     Status: None   Collection Time: 02/18/22  7:04 PM   Specimen: Fluid  Result Value Ref Range Status   Specimen Description FLUID PLEURAL  Final   Special Requests NONE  Final   Gram Stain   Final    FEW WBC PRESENT,BOTH PMN AND MONONUCLEAR NO ORGANISMS SEEN Performed at [facility], [address] [place], [place] [address]    Report Status 02/19/2022 FINAL  Final  MRSA Next Gen by PCR, Nasal     Status: None   Collection Time: 02/19/22 12:32 PM   Specimen: Nasal Mucosa; Nasal Swab  Result Value Ref Range Status   MRSA by PCR Next Gen NOT DETECTED NOT DETECTED Final    Comment: (NOTE) The GeneXpert MRSA Assay (FDA approved for NASAL specimens only), is one component of a comprehensive MRSA colonization surveillance program. It is not intended to diagnose MRSA infection nor to guide or monitor treatment for MRSA infections. Test performance is not FDA approved in patients less than 41 years old. Performed at [facility], [place] [address], [place], [place] [address]          Radiology Studies: [facility] Chest Port 1 View  Result Date: 02/20/2022 CLINICAL DATA:  Malignant left pleural effusion. EXAM: PORTABLE CHEST 1 VIEW COMPARISON:  Chest two views 02/18/2022, CT chest 02/18/2022 FINDINGS: Lung volumes are mildly decreased compared to prior. There is again homogeneous opacification of the majority of the inferior 2/3 of the left hemithorax corresponding to the lobulated and likely loculated moderate to large left pleural effusion better seen on prior CT. The right lung appears clear. No pneumothorax is seen. The visualized portions of the cardiac silhouette and mediastinal contours are unchanged. Left basilar drainage catheter appears unchanged. IMPRESSION: Mildly decreased lung volumes compared to prior. Within this limitation, there is no significant change to possibly mild interval increase in the size of moderate-to-large loculated left pleural effusion better seen on prior CT. Electronically Signed   By: Yvonne Kendall M.D.   On: 02/20/2022 09:09        Scheduled Meds:  amLODipine  10 mg Oral Daily   aspirin EC  81 mg Oral Daily   atorvastatin  80 mg Oral QPM   carvedilol  25 mg Oral BID WC   feeding supplement  237 mL Oral BID BM   gabapentin  100 mg Oral  TID   heparin  5,000  Units Subcutaneous Q8H   hydrALAZINE  50 mg Oral TID   insulin aspart  0-9 Units Subcutaneous TID WC   multivitamin with minerals  1 tablet Oral Daily   polyethylene glycol  17 g Oral Daily   sodium chloride flush  10 mL Intrapleural Q8H   sodium chloride flush  3 mL Intravenous Q12H   Continuous Infusions:  ceFEPime (MAXIPIME) IV 2 g (02/20/22 1713)     LOS: 3 days    Time spent: 35 minutes    Genette Huertas A Gaylynn Seiple, MD [facility]   If 7PM-7AM, please contact night-coverage [email or web address]  02/21/2022, 7:57 AM

## 2022-02-21 NOTE — Progress Notes (Signed)
Pharmacy Antibiotic Note  Heather Rogers is a 76 y.o. female with metastatic left breast cancer (on active treatment with [name] and letrozole) with malignant left pleural effusion post Pleurx catheter placement on 09/07/2021 admitted on 02/18/2022 with malignant L pleural effusion, possible pneumonia.  She underwent instillation of fibrinolytics on 7/24, 7/25. Pharmacy has been consulted for cefepime dosing. Vancomycin dc'd 7/25.  Today, 02/21/2022 Day #4 Cefepime CKDIII, SCr rising above baseline, CrCl ~18 ml/min WBC wnl No culture growth to date  Plan: Continue Cefepime 2g IV q24 per current renal function Follow up renal function & cultures  Height: '5\' 2"'$  (157.5 cm) Weight: 47.6 kg (104 lb 14.4 oz) IBW/kg (Calculated) : 50.1  Temp (24hrs), Avg:99.8 F (37.7 C), Min:98.1 F (36.7 C), Max:101.3 F (38.5 C)  Recent Labs  Lab 02/15/22 1037 02/18/22 1515 02/18/22 1538 02/18/22 1625 02/19/22 0500 02/20/22 0524 02/21/22 0538  WBC 4.6 7.8  --   --  6.4 6.6 6.8  CREATININE 1.48* 1.49*  --   --  1.41* 1.61* 1.95*  LATICACIDVEN  --   --  1.2 1.0  --   --   --      Estimated Creatinine Clearance: 18.4 mL/min (A) (by C-G formula based on SCr of 1.95 mg/dL (H)).    Allergies  Allergen Reactions   Sulfa Antibiotics     Itching    Antimicrobials this admission: 7/23 cefepime >>  7/23 vanc >> 7/25  Dose adjustments this admission:  Microbiology results: 7/23 BCx: ngtd 7/23 UCx: NGF 7/23 Pleural fluid: IP, Gm stain negative  7/24 MRSA PCR: neg  Thank you for allowing pharmacy to be a part of this patient's care.  Peggyann Juba, PharmD, BCPS Pharmacy: [phone] 02/21/2022 7:37 AM

## 2022-02-21 NOTE — Consult Note (Signed)
Chief Complaint: Patient was seen in consultation today for left chest tube placement  Chief Complaint  Patient presents with   Emesis   Fever   Loss of Consciousness   at the request of Brand Males   Referring Physician(s): Brand Males   Supervising Physician: Arne Cleveland  Patient Status: [facility] - In-pt  History of Present Illness: Heather Rogers is a 76 y.o. female with PMHs of HTN, CVA in 2019, DM, CKD stage 3, metastatic breast CA currently receiving chemo, recurrent left pleural effusion s/p left PleurX placement by pulmonary in February 2023 who presented to [facility] ED on 7/23 due to significant decrease in drainage from the PleurX, development of fever, and syncopal episode .   In ED, she was fund to be hypoxic requiring 2 L of supplemental oxygen and was febrile to 101F.  Work-up in the emergency department included CT angiogram of the chest demonstrating large pleural effusion on the left with areas concerning for loculation. CT head negative for acute brain injury, CT AP W with showed no acute process. WBCc normal x 6 days but pt been febrile intermittently, UA, COVID, MRSA negative, blood cx and left pleural effusion cx obtained on 7/23, all showed no growth for 3 days.   PCCM was consulted for Pleurx and effusion management and patient underwent lytic dwell twice with no drainage from the PleurX. A chest tube into superior aspect of the existing PleurX to manage loculated pleural effusion was recommended to the patient and her family members, after thorough discussion and shared decision making, patient and family members agreed to proceed.   IR was requested for image guided left chest tube placement.   Patient laying in bed, not in acute distress.  States that her breathing is not any worse than her baseline today.  Denise headache, fever, chills, shortness of breath, cough, chest pain, abdominal pain, nausea ,vomiting, and bleeding.   Past Medical  History:  Diagnosis Date   Chronic kidney disease, stage 3b ([place])    Diabetes ([place])    Hypertension    Pneumonia due to COVID-19 virus 07/25/2020   Primary malignant neoplasm of breast with metastasis ([place]) 09/14/2021   Stroke (cerebrum) ([place])    Stroke ([place]) 05/15/2018    Past Surgical History:  Procedure Laterality Date   CHEST TUBE INSERTION Left 09/07/2021   Procedure: CHEST TUBE INSERTION;  Surgeon: Candee Furbish, MD;  Location: [facility] ENDOSCOPY;  Service: Pulmonary;  Laterality: Left;   EYE SURGERY Left 04/06/2020   Implant placed   EYE SURGERY Right 03/30/2020   Implant placed    hysterectomy     LOOP RECORDER INSERTION N/A 05/20/2018   Procedure: LOOP RECORDER INSERTION;  Surgeon: Thompson Grayer, MD;  Location: [facility] LAB;  Service: Cardiovascular;  Laterality: N/A;   TEE WITHOUT CARDIOVERSION N/A 05/20/2018   Procedure: TRANSESOPHAGEAL ECHOCARDIOGRAM (TEE);  Surgeon: Sueanne Margarita, MD;  Location: [facility] ENDOSCOPY;  Service: Cardiovascular;  Laterality: N/A;    Allergies: Sulfa antibiotics  Medications: Prior to Admission medications   Medication Sig Start Date End Date Taking? Authorizing Provider  abemaciclib ([name]) 100 MG tablet Take 1 tablet (100 mg total) by mouth 2 (two) times daily. Swallow tablets whole. Do not chew, crush, or split tablets before swallowing. 02/15/22  Yes Nicholas Lose, MD  allopurinol (ZYLOPRIM) 100 MG tablet Take 1 tablet (100 mg total) by mouth daily. 09/27/21  Yes Charlott Rakes, MD  amLODipine (NORVASC) 10 MG tablet Take 10 mg by mouth daily.  Yes [provider]  aspirin EC 81 MG tablet Take 81 mg by mouth daily. Swallow whole.   Yes [provider]  atorvastatin (LIPITOR) 80 MG tablet TAKE ONE TABLET BY MOUTH EVERY EVENING TO LOWER CHOLESTEROL Patient taking differently: Take 80 mg by mouth every evening. 09/27/21  Yes Charlott Rakes, MD  carvedilol (COREG) 25 MG tablet Take 1 tablet (25 mg total) by mouth 2 (two)  times daily with a meal. 09/27/21  Yes Newlin, Enobong, MD  cholecalciferol (VITAMIN D3) 25 MCG (1000 UNIT) tablet Take 1,000 Units by mouth daily.   Yes [provider]  ferrous sulfate 325 (65 FE) MG EC tablet Take 1 tablet (325 mg total) by mouth in the morning and at bedtime. 05/22/21  Yes Mayers, Cari S, PA-C  gabapentin (NEURONTIN) 100 MG capsule Take 1 capsule (100 mg total) by mouth 3 (three) times daily. 09/27/21  Yes Charlott Rakes, MD  hydrALAZINE (APRESOLINE) 50 MG tablet Take 1 tablet (50 mg total) by mouth 3 (three) times daily. 01/08/22  Yes Charlott Rakes, MD  letrozole (FEMARA) 2.5 MG tablet Take 1 tablet (2.5 mg total) by mouth daily. 12/15/21  Yes Nicholas Lose, MD  metFORMIN (GLUCOPHAGE) 500 MG tablet Take 1 tablet (500 mg total) by mouth daily with breakfast. TAKE ONE TABLET BY MOUTH TWICE A DAY WITH A MEAL Patient taking differently: Take 500 mg by mouth daily with breakfast. 01/08/22  Yes Charlott Rakes, MD  Multiple Vitamin (MULTIVITAMIN WITH MINERALS) TABS tablet Take 1 tablet by mouth daily. 09/09/21  Yes Ghimire, Henreitta Leber, MD  ondansetron (ZOFRAN) 8 MG tablet Take 1 tablet (8 mg total) by mouth every 8 (eight) hours as needed for nausea or vomiting. 09/28/21  Yes Nicholas Lose, MD  polyethylene glycol (MIRALAX / GLYCOLAX) 17 g packet Take 17 g by mouth daily.   Yes Nicholas Lose, MD  Accu-Chek FastClix Lancets MISC Check blood sugar fasting and before meals and again if pt feels bad (symptoms of hypo). 09/20/21   Charlott Rakes, MD  Blood Glucose Monitoring Suppl (ACCU-CHEK GUIDE) w/Device KIT 1 each by Does not apply route as directed. 08/25/18   Fulp, Cammie, MD  glucose blood (ACCU-CHEK GUIDE) test strip USE TO CHECK FASTING BLOOD SUGAR AND BEFORE MEALS AND AGAIN IF PATIENT FEELS BAD; SYMPTOMS OF HYPOGLYCEMIA 09/20/21   Charlott Rakes, MD  prochlorperazine (COMPAZINE) 10 MG tablet Take 1 tablet (10 mg total) by mouth every 6 (six) hours as needed for nausea or  vomiting. Patient not taking: Reported on 02/15/2022 09/28/21   Nicholas Lose, MD     Family History  Problem Relation Age of Onset   Hypertension Mother    Stroke Mother    Hypertension Father     Social History   Socioeconomic History   Marital status: Divorced    Spouse name: Not on file   Number of children: Not on file   Years of education: Not on file   Highest education level: Not on file  Occupational History   Not on file  Tobacco Use   Smoking status: Former    Packs/day: 1.00    Years: 15.00    Total pack years: 15.00    Types: Cigarettes    Quit date: 07/30/1985    Years since quitting: 36.5   Smokeless tobacco: Never  Vaping Use   Vaping Use: Never used  Substance and Sexual Activity   Alcohol use: Never   Drug use: Never   Sexual activity: Not Currently  Other Topics Concern   Not on file  Social History Narrative   Lives in [place].   Social Determinants of Health   Financial Resource Strain: Not on file  Food Insecurity: Not on file  Transportation Needs: Not on file  Physical Activity: Not on file  Stress: Not on file  Social Connections: Not on file     Review of Systems: A 12 point ROS discussed and pertinent positives are indicated in the HPI above.  All other systems are negative.  Vital Signs: BP 118/61 (BP Location: Right Arm)   Pulse 84   Temp 99.8 F (37.7 C) (Oral)   Resp (!) 22   Ht 5' 2"  (1.575 m)   Wt 104 lb 14.4 oz (47.6 kg)   SpO2 96%   BMI 19.19 kg/m    Physical Exam Vitals reviewed.  Constitutional:      General: She is not in acute distress. HENT:     Head: Normocephalic.     Mouth/Throat:     Mouth: Mucous membranes are moist.     Pharynx: Oropharynx is clear.  Cardiovascular:     Rate and Rhythm: Normal rate and regular rhythm.     Heart sounds: Normal heart sounds.  Pulmonary:     Effort: Pulmonary effort is normal.     Breath sounds: No stridor. No wheezing.     Comments: On [place]  Decreased BS on left   Abdominal:     General: Abdomen is flat. Bowel sounds are normal.     Palpations: Abdomen is soft.  Musculoskeletal:     Cervical back: Neck supple.  Skin:    General: Skin is warm and dry.     Coloration: Skin is not jaundiced or pale.  Neurological:     Mental Status: She is alert. Mental status is at baseline.  Psychiatric:        Mood and Affect: Mood normal.        Behavior: Behavior normal.     MD Evaluation Airway: WNL Heart: WNL Abdomen: WNL Chest/ Lungs: WNL ASA  Classification: 3 Mallampati/Airway Score: Two  Imaging: DG CHEST PORT 1 VIEW  Result Date: 02/21/2022 CLINICAL DATA:  [id number] patient with chronic kidney disease 3, diabetes, hypertension, metastatic breast cancer on treatment, left pleural effusion and status post PleurX catheter insertion EXAM: PORTABLE CHEST 1 VIEW COMPARISON:  February 20, 2022 FINDINGS: Again seen is the PleurX catheter at the left lung base. There has been complete opacification of the left thorax in comparison to lower 3/4 in the previous study of pleural effusion. Leftward deviation of the mediastinum. Right lung remains clear. The visualized skeletal structures are unremarkable. IMPRESSION: There has been complete opacification of the left thorax of pleural effusion and has worsened in the interim. Stable left basal PleurX catheter. Right lung remains clear. Electronically Signed   By: Frazier Richards M.D.   On: 02/21/2022 09:37   DG Chest Port 1 View  Result Date: 02/20/2022 CLINICAL DATA:  Malignant left pleural effusion. EXAM: PORTABLE CHEST 1 VIEW COMPARISON:  Chest two views 02/18/2022, CT chest 02/18/2022 FINDINGS: Lung volumes are mildly decreased compared to prior. There is again homogeneous opacification of the majority of the inferior 2/3 of the left hemithorax corresponding to the lobulated and likely loculated moderate to large left pleural effusion better seen on prior CT. The right lung appears clear. No pneumothorax is seen. The  visualized portions of the cardiac silhouette and mediastinal contours are unchanged. Left basilar drainage catheter appears  unchanged. IMPRESSION: Mildly decreased lung volumes compared to prior. Within this limitation, there is no significant change to possibly mild interval increase in the size of moderate-to-large loculated left pleural effusion better seen on prior CT. Electronically Signed   By: Yvonne Kendall M.D.   On: 02/20/2022 09:09   CT Angio Chest PE W and/or Wo Contrast  Result Date: 02/18/2022 CLINICAL DATA:  Pulmonary embolus suspected with high probability. History of metastatic breast cancer. Today vomiting and fever. Currently on oral chemotherapy. EXAM: CT ANGIOGRAPHY CHEST CT ABDOMEN AND PELVIS WITH CONTRAST TECHNIQUE: Multidetector CT imaging of the chest was performed using the standard protocol during bolus administration of intravenous contrast. Multiplanar CT image reconstructions and MIPs were obtained to evaluate the vascular anatomy. Multidetector CT imaging of the abdomen and pelvis was performed using the standard protocol during bolus administration of intravenous contrast. RADIATION DOSE REDUCTION: This exam was performed according to the departmental dose-optimization program which includes automated exposure control, adjustment of the mA and/or kV according to patient size and/or use of iterative reconstruction technique. CONTRAST:  50m OMNIPAQUE IOHEXOL 350 MG/ML SOLN COMPARISON:  PET-CT 09/29/2021.  CT chest 01/15/2022 FINDINGS: CTA CHEST FINDINGS Cardiovascular: Evaluation of the pulmonary arteries is limited by suboptimal contrast bolus and motion artifact. As visualized, there are no significant filling defects in the central pulmonary arteries suggesting no large central pulmonary embolus. Heart size is normal. No pericardial effusion. Normal caliber thoracic aorta. No aortic dissection. Calcification of the aorta and coronary arteries. Mediastinum/Nodes: The esophagus is  decompressed. Subcentimeter right thyroid gland nodule is unchanged since prior studies. Hilar, subcarinal, and left aortopulmonic window lymph nodes are similar to prior study with left AP window nodes measuring 1.6 cm in short axis dimension. This corresponds to known metastatic disease. Lungs/Pleura: There is a large left pleural effusion with somewhat nodular appearance likely representing a malignant effusion. This is significantly increased in size since the prior study despite presence of a chest drain. Atelectasis in the left lung. Right lung is clear. Airways are patent. Musculoskeletal: Degenerative changes in the spine. Heterogeneous areas of bone sclerosis and destruction in the sternum and in several midthoracic vertebra, likely representing bone metastasis. This corresponds to changes on prior PET-CT and CT. Large soft tissue mass in the left breast corresponding to known neoplasm. Skin thickening and infiltration could be neoplastic or radiation related. Lymphadenopathy in the left axilla consistent with metastatic disease. Review of the MIP images confirms the above findings. CT ABDOMEN and PELVIS FINDINGS Hepatobiliary: No focal liver abnormality is seen. No gallstones, gallbladder wall thickening, or biliary dilatation. Pancreas: Unremarkable. No pancreatic ductal dilatation or surrounding inflammatory changes. Spleen: Normal in size without focal abnormality. Adrenals/Urinary Tract: No adrenal gland nodules. Subcentimeter simple appearing cysts in both kidneys consistent with benign cyst. No imaging follow-up is indicated for these typically benign lesions. Renal nephrograms are symmetrical. No hydronephrosis or hydroureter. Bladder is normal. Stomach/Bowel: Stomach is within normal limits. Appendix appears normal. No evidence of bowel wall thickening, distention, or inflammatory changes. Vascular/Lymphatic: Scattered lymph nodes in the celiac axis and retroperitoneum without pathologic  enlargement. Diffuse calcification of the aorta. No aneurysm. Reproductive: No abnormal pelvic masses. Other: No free air or free fluid in the abdomen. Abdominal wall musculature appears intact. Musculoskeletal: No destructive bone lesions. Review of the MIP images confirms the above findings. IMPRESSION: 1. Examination of the pulmonary arteries is technically limited but no significant central pulmonary embolus is identified. 2. Large left pleural effusion with left lung atelectasis demonstrates  significant progression since previous study. A left chest tube is in place. 3. Metastatic lymphadenopathy in the mediastinum and hilar regions as well as bone metastases are unchanged. Left breast mass and left axillary lymphadenopathy again demonstrated. 4. No acute process demonstrated in the abdomen or pelvis. Electronically Signed   By: Lucienne Capers M.D.   On: 02/18/2022 17:58   CT ABDOMEN PELVIS W CONTRAST  Result Date: 02/18/2022 CLINICAL DATA:  Pulmonary embolus suspected with high probability. History of metastatic breast cancer. Today vomiting and fever. Currently on oral chemotherapy. EXAM: CT ANGIOGRAPHY CHEST CT ABDOMEN AND PELVIS WITH CONTRAST TECHNIQUE: Multidetector CT imaging of the chest was performed using the standard protocol during bolus administration of intravenous contrast. Multiplanar CT image reconstructions and MIPs were obtained to evaluate the vascular anatomy. Multidetector CT imaging of the abdomen and pelvis was performed using the standard protocol during bolus administration of intravenous contrast. RADIATION DOSE REDUCTION: This exam was performed according to the departmental dose-optimization program which includes automated exposure control, adjustment of the mA and/or kV according to patient size and/or use of iterative reconstruction technique. CONTRAST:  86m OMNIPAQUE IOHEXOL 350 MG/ML SOLN COMPARISON:  PET-CT 09/29/2021.  CT chest 01/15/2022 FINDINGS: CTA CHEST FINDINGS  Cardiovascular: Evaluation of the pulmonary arteries is limited by suboptimal contrast bolus and motion artifact. As visualized, there are no significant filling defects in the central pulmonary arteries suggesting no large central pulmonary embolus. Heart size is normal. No pericardial effusion. Normal caliber thoracic aorta. No aortic dissection. Calcification of the aorta and coronary arteries. Mediastinum/Nodes: The esophagus is decompressed. Subcentimeter right thyroid gland nodule is unchanged since prior studies. Hilar, subcarinal, and left aortopulmonic window lymph nodes are similar to prior study with left AP window nodes measuring 1.6 cm in short axis dimension. This corresponds to known metastatic disease. Lungs/Pleura: There is a large left pleural effusion with somewhat nodular appearance likely representing a malignant effusion. This is significantly increased in size since the prior study despite presence of a chest drain. Atelectasis in the left lung. Right lung is clear. Airways are patent. Musculoskeletal: Degenerative changes in the spine. Heterogeneous areas of bone sclerosis and destruction in the sternum and in several midthoracic vertebra, likely representing bone metastasis. This corresponds to changes on prior PET-CT and CT. Large soft tissue mass in the left breast corresponding to known neoplasm. Skin thickening and infiltration could be neoplastic or radiation related. Lymphadenopathy in the left axilla consistent with metastatic disease. Review of the MIP images confirms the above findings. CT ABDOMEN and PELVIS FINDINGS Hepatobiliary: No focal liver abnormality is seen. No gallstones, gallbladder wall thickening, or biliary dilatation. Pancreas: Unremarkable. No pancreatic ductal dilatation or surrounding inflammatory changes. Spleen: Normal in size without focal abnormality. Adrenals/Urinary Tract: No adrenal gland nodules. Subcentimeter simple appearing cysts in both kidneys  consistent with benign cyst. No imaging follow-up is indicated for these typically benign lesions. Renal nephrograms are symmetrical. No hydronephrosis or hydroureter. Bladder is normal. Stomach/Bowel: Stomach is within normal limits. Appendix appears normal. No evidence of bowel wall thickening, distention, or inflammatory changes. Vascular/Lymphatic: Scattered lymph nodes in the celiac axis and retroperitoneum without pathologic enlargement. Diffuse calcification of the aorta. No aneurysm. Reproductive: No abnormal pelvic masses. Other: No free air or free fluid in the abdomen. Abdominal wall musculature appears intact. Musculoskeletal: No destructive bone lesions. Review of the MIP images confirms the above findings. IMPRESSION: 1. Examination of the pulmonary arteries is technically limited but no significant central pulmonary embolus is identified. 2. Large  left pleural effusion with left lung atelectasis demonstrates significant progression since previous study. A left chest tube is in place. 3. Metastatic lymphadenopathy in the mediastinum and hilar regions as well as bone metastases are unchanged. Left breast mass and left axillary lymphadenopathy again demonstrated. 4. No acute process demonstrated in the abdomen or pelvis. Electronically Signed   By: Lucienne Capers M.D.   On: 02/18/2022 17:58   CT Head Wo Contrast  Result Date: 02/18/2022 CLINICAL DATA:  History of metastatic breast cancer presents with vomiting and fever. Currently on oral chemotherapy. One episode emesis. EXAM: CT HEAD WITHOUT CONTRAST CT CERVICAL SPINE WITHOUT CONTRAST TECHNIQUE: Multidetector CT imaging of the head and cervical spine was performed following the standard protocol without intravenous contrast. Multiplanar CT image reconstructions of the cervical spine were also generated. RADIATION DOSE REDUCTION: This exam was performed according to the departmental dose-optimization program which includes automated exposure  control, adjustment of the mA and/or kV according to patient size and/or use of iterative reconstruction technique. COMPARISON:  Head CT 05/15/2018 FINDINGS: CT HEAD FINDINGS Brain: Ventricles, cisterns and other CSF spaces are within normal. Mild chronic ischemic microvascular disease. Old left occipital infarct. No mass, mass effect or shift of midline structures. No acute hemorrhage. No definite acute infarction. Vascular: No hyperdense vessel or unexpected calcification. Skull: Normal. Negative for fracture or focal lesion. Sinuses/Orbits: No acute finding. Other: None. CT CERVICAL SPINE FINDINGS Alignment: Normal. Skull base and vertebrae: Mild to moderate spondylosis throughout the cervical spine to include facet arthropathy and uncovertebral joint spurring. Atlantoaxial articulation is unremarkable. Minimal bilateral neural foraminal narrowing at the C5-6 level and mild left-sided neural foraminal narrowing at the C6-7 level. No acute fracture. Soft tissues and spinal canal: Prevertebral soft tissues are normal. No significant canal stenosis. Disc levels: Disc space narrowing at the C5-6 and C6-7 levels and to lesser extent at the C4-5 and C7-T1 levels. Upper chest: No acute findings. Other: None. IMPRESSION: 1. No acute brain injury. 2. 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 3. No acute cervical spine injury. 4. Mild to moderate spondylosis throughout the cervical spine with multilevel disc disease and neural foraminal narrowing as described. Electronically Signed   By: Marin Olp M.D.   On: 02/18/2022 15:12   CT Cervical Spine Wo Contrast  Result Date: 02/18/2022 CLINICAL DATA:  History of metastatic breast cancer presents with vomiting and fever. Currently on oral chemotherapy. One episode emesis. EXAM: CT HEAD WITHOUT CONTRAST CT CERVICAL SPINE WITHOUT CONTRAST TECHNIQUE: Multidetector CT imaging of the head and cervical spine was performed following the  standard protocol without intravenous contrast. Multiplanar CT image reconstructions of the cervical spine were also generated. RADIATION DOSE REDUCTION: This exam was performed according to the departmental dose-optimization program which includes automated exposure control, adjustment of the mA and/or kV according to patient size and/or use of iterative reconstruction technique. COMPARISON:  Head CT 05/15/2018 FINDINGS: CT HEAD FINDINGS Brain: Ventricles, cisterns and other CSF spaces are within normal. Mild chronic ischemic microvascular disease. Old left occipital infarct. No mass, mass effect or shift of midline structures. No acute hemorrhage. No definite acute infarction. Vascular: No hyperdense vessel or unexpected calcification. Skull: Normal. Negative for fracture or focal lesion. Sinuses/Orbits: No acute finding. Other: None. CT CERVICAL SPINE FINDINGS Alignment: Normal. Skull base and vertebrae: Mild to moderate spondylosis throughout the cervical spine to include facet arthropathy and uncovertebral joint spurring. Atlantoaxial articulation is unremarkable. Minimal bilateral neural foraminal narrowing at the C5-6 level and  mild left-sided neural foraminal narrowing at the C6-7 level. No acute fracture. Soft tissues and spinal canal: Prevertebral soft tissues are normal. No significant canal stenosis. Disc levels: Disc space narrowing at the C5-6 and C6-7 levels and to lesser extent at the C4-5 and C7-T1 levels. Upper chest: No acute findings. Other: None. IMPRESSION: 1. No acute brain injury. 2. 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 3. No acute cervical spine injury. 4. Mild to moderate spondylosis throughout the cervical spine with multilevel disc disease and neural foraminal narrowing as described. Electronically Signed   By: Marin Olp M.D.   On: 02/18/2022 15:12   DG Chest 2 View  Result Date: 02/18/2022 CLINICAL DATA:  fx/sob EXAM: CHEST - 2 VIEW  COMPARISON:  Chest x-ray 09/07/2021, CT chest 01/15/2022 FINDINGS: The heart and mediastinal contours are unchanged. Aortic calcification. Left upper lobe hazy airspace opacity. No pulmonary edema. Interval increase in size of a possibly loculated, moderate volume left pleural effusion. No right pleural effusion. No pneumothorax. No acute osseous abnormality. IMPRESSION: 1. Interval increase in size of a possibly loculated, moderate volume left pleural effusion. 2. Left upper lobe hazy airspace opacity may represent combination of atelectasis versus infection/inflammation. 3.  Aortic Atherosclerosis (ICD10-I70.0). Electronically Signed   By: Iven Finn M.D.   On: 02/18/2022 15:07    Labs:  CBC: Recent Labs    02/18/22 1515 02/19/22 0500 02/20/22 0524 02/21/22 0538  WBC 7.8 6.4 6.6 6.8  HGB 9.5* 8.1* 8.1* 8.1*  HCT 29.3* 25.5* 25.8* 25.1*  PLT 306 246 245 249    COAGS: No results for input(s): "INR", "APTT" in the last 8760 hours.  BMP: Recent Labs    02/18/22 1515 02/19/22 0500 02/20/22 0524 02/21/22 0538  NA 137 137 138 137  K 4.0 4.0 4.4 4.8  CL 106 109 110 109  CO2 24 20* 22 21*  GLUCOSE 160* 105* 153* 169*  BUN 28* 21 28* 38*  CALCIUM 9.6 8.8* 9.1 9.1  CREATININE 1.49* 1.41* 1.61* 1.95*  GFRNONAA 36* 39* 33* 26*    LIVER FUNCTION TESTS: Recent Labs    12/21/21 1032 01/18/22 1001 02/15/22 1037 02/18/22 1515  BILITOT 0.3 0.3 0.3 0.3  AST 19 18 16 18   ALT 15 14 12 15   ALKPHOS 60 56 59 50  PROT 7.7 7.7 7.2 7.5  ALBUMIN 4.2 4.2 4.0 3.6    TUMOR MARKERS: No results for input(s): "AFPTM", "CEA", "CA199", "CHROMGRNA" in the last 8760 hours.  Assessment and Plan: 76 y.o. female with CKD stage 3 and metastatic breast CA currently on chemo with recurrent left pleural effusion s/p left PleurX placement 09/07/21 by pulmonary disease who presents with malfunctioning left chest PleurX, loculated left pleural effusion and fever. Patient underwent tPA dwell x 2, still  no drainage from the PleurX.   IR was requested for image guided left chest tube placement. Case was reviewed and approved by Dr. Vernard Gambles.   The procedure is tentatively scheduled for today pending IR schedule.  NPO since 9 am  Currently afebrile but Tmax 100.9 overnight. 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  Procedure discussed with daughter Miss [name] via telephone   Risks and benefits of chest tube placement were discussed with the daughter including bleeding, infection, damage to adjacent structures, malfunction of the tube requiring additional procedures and sepsis.  All of the daughter 's questions  were answered, patient is agreeable to proceed. Consent signed and in chart.     Thank you for this interesting consult.  I greatly enjoyed meeting HAZLEY DEZEEUW and look forward to participating in their care.  A copy of this report was sent to the requesting provider on this date.  Electronically Signed: Tera Mater, PA-C 02/21/2022, 11:32 AM   I spent a total of 20 Minutes    in face to face in clinical consultation, greater than 50% of which was counseling/coordinating care for left loculated pleural effusion.   This chart was dictated using voice recognition software.  Despite best efforts to proofread,  errors can occur which can change the documentation meaning.

## 2022-02-21 NOTE — Progress Notes (Signed)
Patient is having SOB and elevated temp. Physician in to see patient. Patient has no drainage from pleurex. Physician ordered a second pleurex to drain. Patient is bed, resting. Call light within reach, bed in lowest position.

## 2022-02-21 NOTE — Progress Notes (Signed)
PT Cancellation Note  Patient Details Name: IRMGARD RAMPERSAUD MRN: [id number] DOB: August 21, 1945   Cancelled Treatment:    Reason Eval/Treat Not Completed: Patient at procedure or test/unavailable chest tube   Myrtis Hopping [place] 02/21/2022, 2:29 PM Jannette Spanner PT, DPT Physical Therapist Acute Rehabilitation Services Preferred contact method: [place] Weekend Pager Only: [phone] Office: [phone]

## 2022-02-21 NOTE — Plan of Care (Signed)
  Problem: Education: Goal: Knowledge of General Education information will improve Description Including pain rating scale, medication(s)/side effects and non-pharmacologic comfort measures Outcome: Progressing   Problem: Health Behavior/Discharge Planning: Goal: Ability to manage health-related needs will improve Outcome: Progressing   

## 2022-02-21 NOTE — Progress Notes (Signed)
   02/21/22 2159  Assess: MEWS Score  Temp (!) 102.7 F (39.3 C)  BP (!) 133/56  MAP (mmHg) 79  Pulse Rate 97  ECG Heart Rate 98  Resp 20  SpO2 96 %  O2 Device Nasal Cannula  O2 Flow Rate (L/min) 2 L/min  Assess: MEWS Score  MEWS Temp 2  MEWS Systolic 0  MEWS Pulse 0  MEWS RR 0  MEWS LOC 0  MEWS Score 2  MEWS Score Color Yellow  Assess: if the MEWS score is Yellow or Red  Were vital signs taken at a resting state? Yes  Focused Assessment No change from prior assessment  Does the patient meet 2 or more of the SIRS criteria? No  MEWS guidelines implemented *See Row Information* Yes  Treat  MEWS Interventions Administered prn meds/treatments  Pain Scale 0-10  Pain Score 0  Take Vital Signs  Increase Vital Sign Frequency  Yellow: Q 2hr X 2 then Q 4hr X 2, if remains yellow, continue Q 4hrs  Escalate  MEWS: Escalate Yellow: discuss with charge nurse/RN and consider discussing with provider and RRT  Notify: Provider  Provider Name/Title Gershon Cull, NP  Date Provider Notified 02/21/22  Time Provider Notified 2222  Method of Notification Face-to-face  Notification Reason Other (Comment) (Yellow MEWs)  Provider response No new orders  Date of Provider Response 02/21/22  Time of Provider Response 2222  Document  Patient Outcome Stabilized after interventions  Progress note created (see row info) Yes  Assess: SIRS CRITERIA  SIRS Temperature  1  SIRS Pulse 1  SIRS Respirations  0  SIRS WBC 1  SIRS Score Sum  3

## 2022-02-21 NOTE — Progress Notes (Signed)
OT Cancellation Note  Patient Details Name: Heather Rogers MRN: [id number] DOB: Dec 21, 1945   Cancelled Treatment:    Reason Eval/Treat Not Completed: Medical issues which prohibited therapy Patient with increased temp with nursing asking for therapy to hold off at this time. OT to continue to follow and check back as schedule will allow Jackelyn Poling OTR/L, [facility] Department Office# [phone] Pager# [phone]  02/21/2022, 11:21 AM

## 2022-02-21 NOTE — Consult Note (Addendum)
NAME:  Heather Rogers, MRN:  [id number], DOB:  [birth year], LOS: 3 ADMISSION DATE:  02/18/2022, CONSULTATION DATE:  7/24 REFERRING MD:  Dr. Manuella Ghazi, CHIEF COMPLAINT:  SOB   BRIEF  76 year old female with past medical history as below, which is significant for CKD 3, diabetes, hypertension, metastatic breast cancer for which she is currently undergoing treatment with [name] and letrozole.  She has Pleurx catheter on the left which is followed by Dr. Verlee Monte in the pulmonary clinic.  She was last seen in April and at that time was draining the Pleurx every other day.  Output decreased and she was able to drain about every 4 days.  Over the course of a couple of weeks her output has significantly increased to the point where her daughter was draining every 2 to 3 days with outputs around 2-300.  On 7/23 the patient developed fever and suffered a syncopal episode for which she presented to [facility] emergency department.  Upon arrival to the emergency department she was hypoxic requiring 2 L of supplemental oxygen and was febrile to 101F.  Work-up in the emergency department included CT angiogram of the chest demonstrating large pleural effusion on the left with areas concerning for loculation. Pleurx vacuum bottle attached in ED and only drained a small amount of fluid. PCCM consulted for Pleurx and effusion management.    Pertinent  Medical History   has a past medical history of Chronic kidney disease, stage 3b ([place]), Diabetes ([place]), Hypertension, Pneumonia due to COVID-19 virus (07/25/2020), Primary malignant neoplasm of breast with metastasis ([place]) (09/14/2021), Stroke (cerebrum) ([place]), and Stroke ([place]) (05/15/2018).   Significant Hospital Events: Including procedures, antibiotic start and stop dates in addition to other pertinent events   02/18/2022  - wrosening left effusionloculation v , concern for obstruction of baseline left pleurx -> s/p ip fibrinolytucs 7/23 fluid culture 7/23 - covid  neg 7/23 MRSA neg 7/23 Blood c/s 7/25 - 3L -  97%. New o2 need.  Per daughter - till Sunday 02/18/22 pleurx was draining well. Same day fever too. Then "passed out" came to ER -> found to have worsening effusion and loculated. Now has reponded to first dose intrapleural fibrinolytic but CXR looks unchnaged. Daughter feels she is a bit more tired today. FEbrile 101/.50F yesteday  2nd DOSE IP LYITC ABG without hypercapnia - patient was sleepy  Interim History / Subjective:    7/26- still on 3L [place]. No drainage after 2nd dose IP Lytic per RN - only 25cc ovrnight.  Pr daughter Heather Rogers - > patient did not turn per protocol and then went to sleep. But daugther also feels -> patient did drain well after 2nd dose. However,  Pleurx -> at bottom.  Still febrile. Eating BF. Oriented and stable per RN. WBC normal   Objective   Blood pressure 117/60, pulse 96, temperature (!) 100.9 F (38.3 C), temperature source Oral, resp. rate (!) 24, height '5\' 2"'$  (1.575 m), weight 47.6 kg, SpO2 96 %.        Intake/Output Summary (Last 24 hours) at 02/21/2022 0855 Last data filed at 02/20/2022 2327 Gross per 24 hour  Intake 13 ml  Output --  Net 13 ml   Filed Weights   02/18/22 1428 02/20/22 0500 02/21/22 0601  Weight: 62.1 kg 66.5 kg 47.6 kg    Examination: General Appearance:  Looks cstable and mildly conditioned. Sitting in bed. Trying to have BF Head:  Normocephalic, without obvious abnormality, atraumatic Eyes:  PERRL - yes, conjunctiva/corneas -  mudd     Ears:  Normal external ear canals, both ears Nose:  G tube - no but has [place] Throat:  ETT TUBE - no , OG tube - no Neck:  Supple,  No enlargement/tenderness/nodules Lungs: Clear to auscultation bilaterally, Diminished left sidde Heart:  S1 and S2 normal, no murmur, CVP - no.  Pressors - no Abdomen:  Soft, no masses, no organomegaly Genitalia / Rectal:  Not done Extremities:  Extremities- intact Skin:  ntact in exposed areas . Sacral area - x Neurologic:   Sedation - none -> RASS - +1 . Moves all 4s - yes. CAM-ICU - neg . 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 Pleurx placed in February 2023 Left empyema of above - Present on Admit (worsening loculation + fever) with loss of drainage - s/p first dose IP lytuc  7/26  - still febrile. Offf vacnc. Due to MRSA PCR neg and culture  negative so far  Responded  with drainag after first dose  IP lytic but not with 2nd dose -> probably because of bottom position of the pleurx VERSUS NOT ROTATING   Plan - cxr portable -> depending on result will decide on another chest tube v 3rd dose IP  - Addendum 10:26 AM -> CXR way worse with completee opracification of left side -> will ask IR for higher side left Pig tail -> then attempt lytics through this (the pleurlex is way in the bottom)   - - abx as below - cefepime     Acute hypoxemic respiratory failure secondary to effusion and likely pneumonia.   7/25- still needing 3L [place]  Plan -  Continue empiric antibiotics - Supplemental oxygen to keep O2 sats > 92% (racial pulse ox gap) - Blood culture pending  Metastatic breast Ca - Holding oral chemotherapy  Remainder per primary team  Best Practice (right click and "Reselect all SmartList Selections" daily)   Daughter update at bedside 7/24 and 02/20/22 and on phone 02/21/22  Anti-infectives (From admission, onward)    Start     Dose/Rate Route Frequency Ordered Stop   02/20/22 1600  vancomycin (VANCOCIN) IVPB 1000 mg/200 mL premix  Status:  Discontinued        1,000 mg 200 mL/hr over 60 Minutes Intravenous Every 48 hours 02/18/22 2008 02/20/22 1153   02/19/22 1600  ceFEPIme (MAXIPIME) 2 g in sodium chloride 0.9 % 100 mL IVPB        2 g 200 mL/hr over 30 Minutes Intravenous Every 24 hours 02/18/22 2004     02/18/22 1530  ceFEPIme (MAXIPIME) 2 g in sodium  chloride 0.9 % 100 mL IVPB        2 g 200 mL/hr over 30 Minutes Intravenous  Once 02/18/22 1517 02/18/22 1608   02/18/22 1530  vancomycin (VANCOCIN) IVPB 1000 mg/200 mL premix        1,000 mg 200 mL/hr over 60 Minutes Intravenous  Once 02/18/22 1526 02/18/22 1715        SIGNATURE    Dr. Brand Males, M.D., F.C.C.P,  Pulmonary and Critical Care Medicine Staff Physician, [place] Director - Interstitial Lung Disease  Program  Medical Director - [facility] at [place], [place], [address]  NPI Number:  NPI #[id number] DEA Number: [id number]  Pager: [phone], If no answer  -> Check AMION or Try [phone] Telephone (clinical office): [phone] Telephone (research): [phone]  8:55 AM 02/21/2022     LABS    PULMONARY Recent Labs  Lab 02/20/22 1155  PHART 7.39  PCO2ART 38  PO2ART 74*  HCO3 23.0  O2SAT 95.7    CBC Recent Labs  Lab 02/19/22 0500 02/20/22 0524 02/21/22 0538  HGB 8.1* 8.1* 8.1*  HCT 25.5* 25.8* 25.1*  WBC 6.4 6.6 6.8  PLT 246 245 249    COAGULATION No results for input(s): "INR" in the last 168 hours.  CARDIAC  No results for input(s): "TROPONINI" in the last 168 hours. No results for input(s): "PROBNP" in the last 168 hours.   CHEMISTRY Recent Labs  Lab 02/15/22 1037 02/18/22 1515 02/19/22 0500 02/20/22 0524 02/21/22 0538  NA 139 137 137 138 137  K 4.3 4.0 4.0 4.4 4.8  CL 106 106 109 110 109  CO2 28 24 20* 22 21*  GLUCOSE 95 160* 105* 153* 169*  BUN 20 28* 21 28* 38*  CREATININE 1.48* 1.49* 1.41* 1.61* 1.95*  CALCIUM 10.0 9.6 8.8* 9.1 9.1  MG  --   --   --  2.4 2.7*   Estimated Creatinine Clearance: 18.4 mL/min (A) (by C-G formula based on SCr of 1.95 mg/dL (H)).   LIVER Recent Labs  Lab 02/15/22 1037 02/18/22 1515  AST 16 18  ALT 12 15  ALKPHOS 59 50  BILITOT 0.3 0.3  PROT 7.2 7.5  ALBUMIN 4.0 3.6      INFECTIOUS Recent Labs  Lab 02/18/22 1538 02/18/22 1625 02/19/22 1059 02/20/22 0524 02/21/22 0538  LATICACIDVEN 1.2 1.0  --   --   --   PROCALCITON  --   --  0.23 0.26 0.51     ENDOCRINE CBG (last 3)  Recent Labs    02/20/22 1652 02/20/22 2155 02/21/22 0735  GLUCAP 210* 145* 156*         IMAGING x48h  - image(s) personally visualized  -   highlighted in bold DG Chest Port 1 View  Result Date: 02/20/2022 CLINICAL DATA:  Malignant left pleural effusion. EXAM: PORTABLE CHEST 1 VIEW COMPARISON:  Chest two views 02/18/2022, CT chest 02/18/2022 FINDINGS: Lung volumes are mildly decreased compared to prior. There is again homogeneous opacification of the majority of the inferior 2/3 of the left hemithorax corresponding to the lobulated and likely loculated moderate to large left pleural effusion better seen on prior CT. The right lung appears clear. No pneumothorax is seen. The visualized portions of the cardiac silhouette and mediastinal contours are unchanged. Left basilar drainage catheter appears unchanged. IMPRESSION: Mildly decreased lung volumes compared to prior. Within this limitation, there is no significant change to possibly mild interval increase in the size of moderate-to-large loculated left pleural effusion better seen on prior CT. Electronically Signed   By: Yvonne Kendall M.D.   On: 02/20/2022 09:09

## 2022-02-21 NOTE — Care Management Important Message (Signed)
Important Message  Patient Details IM Letter given to the Patient. Name: Heather Rogers MRN: [id number] Date of Birth: [birth year]   Medicare Important Message Given:  Yes     Kerin Salen 02/21/2022, 9:57 AM

## 2022-02-22 ENCOUNTER — Inpatient Hospital Stay (HOSPITAL_COMMUNITY): Payer: Medicare HMO

## 2022-02-22 LAB — BODY FLUID CELL COUNT WITH DIFFERENTIAL
Eos, Fluid: 0 %
Lymphs, Fluid: 5 %
Monocyte-Macrophage-Serous Fluid: 1 % — ABNORMAL LOW (ref 50–90)
Neutrophil Count, Fluid: 94 % — ABNORMAL HIGH (ref 0–25)
Total Nucleated Cell Count, Fluid: 75 cu mm (ref 0–1000)

## 2022-02-22 LAB — BASIC METABOLIC PANEL
Anion gap: 7 (ref 5–15)
BUN: 32 mg/dL — ABNORMAL HIGH (ref 8–23)
CO2: 22 mmol/L (ref 22–32)
Calcium: 8.5 mg/dL — ABNORMAL LOW (ref 8.9–10.3)
Chloride: 107 mmol/L (ref 98–111)
Creatinine, Ser: 1.36 mg/dL — ABNORMAL HIGH (ref 0.44–1.00)
GFR, Estimated: 40 mL/min — ABNORMAL LOW (ref >60)
Glucose, Bld: 173 mg/dL — ABNORMAL HIGH (ref 70–99)
Potassium: 4.5 mmol/L (ref 3.5–5.1)
Sodium: 136 mmol/L (ref 135–145)

## 2022-02-22 LAB — GLUCOSE, CAPILLARY
Glucose-Capillary: 167 mg/dL — ABNORMAL HIGH (ref 70–99)
Glucose-Capillary: 168 mg/dL — ABNORMAL HIGH (ref 70–99)
Glucose-Capillary: 182 mg/dL — ABNORMAL HIGH (ref 70–99)
Glucose-Capillary: 174 mg/dL — ABNORMAL HIGH (ref 70–99)

## 2022-02-22 LAB — LACTATE DEHYDROGENASE, PLEURAL OR PERITONEAL FLUID: LD, Fluid: 237 U/L — ABNORMAL HIGH (ref 3–23)

## 2022-02-22 LAB — CBC
HCT: 23.9 % — ABNORMAL LOW (ref 36.0–46.0)
Hemoglobin: 7.9 g/dL — ABNORMAL LOW (ref 12.0–15.0)
MCH: 26.2 pg (ref 26.0–34.0)
MCHC: 33.1 g/dL (ref 30.0–36.0)
MCV: 79.4 fL — ABNORMAL LOW (ref 80.0–100.0)
Platelets: 271 10*3/uL (ref 150–400)
RBC: 3.01 MIL/uL — ABNORMAL LOW (ref 3.87–5.11)
RDW: 14.6 % (ref 11.5–15.5)
WBC: 5.6 10*3/uL (ref 4.0–10.5)
nRBC: 0 % (ref 0.0–0.2)

## 2022-02-22 NOTE — TOC Progression Note (Signed)
Transition of Care [facility]) - Progression Note    Patient Details  Name: Heather Rogers MRN: [id number] Date of Birth: October 19, 1945  [facility] CM/SW Contact  Leeroy Cha, RN Phone Number: 02/22/2022, 9:20 AM  Clinical Narrative:    [id number] chest tube pluerex placed on [id number]. Will follow for toc management and possible home with plurex cath.        Expected Discharge Plan and Services                                                 Social Determinants of Health (SDOH) Interventions    Readmission Risk Interventions     No data to display

## 2022-02-22 NOTE — Progress Notes (Signed)
Referring Physician(s):  Ramaswamy,M  Supervising Physician: Markus Daft  Patient Status:  [facility] - In-pt  Chief Complaint: Loculated malignant left pleural effusion, metastatic breast cancer, dyspnea   Subjective: Pt states that she is not as dyspneic as previously noted since additional chest drain placed yesterday; denies cough, has some soreness at drain sites   Allergies: Sulfa antibiotics  Medications: Prior to Admission medications   Medication Sig Start Date End Date Taking? Authorizing Provider  abemaciclib ([name]) 100 MG tablet Take 1 tablet (100 mg total) by mouth 2 (two) times daily. Swallow tablets whole. Do not chew, crush, or split tablets before swallowing. 02/15/22  Yes Nicholas Lose, MD  allopurinol (ZYLOPRIM) 100 MG tablet Take 1 tablet (100 mg total) by mouth daily. 09/27/21  Yes Charlott Rakes, MD  amLODipine (NORVASC) 10 MG tablet Take 10 mg by mouth daily.   Yes [provider]  aspirin EC 81 MG tablet Take 81 mg by mouth daily. Swallow whole.   Yes [provider]  atorvastatin (LIPITOR) 80 MG tablet TAKE ONE TABLET BY MOUTH EVERY EVENING TO LOWER CHOLESTEROL Patient taking differently: Take 80 mg by mouth every evening. 09/27/21  Yes Charlott Rakes, MD  carvedilol (COREG) 25 MG tablet Take 1 tablet (25 mg total) by mouth 2 (two) times daily with a meal. 09/27/21  Yes Newlin, Enobong, MD  cholecalciferol (VITAMIN D3) 25 MCG (1000 UNIT) tablet Take 1,000 Units by mouth daily.   Yes [provider]  ferrous sulfate 325 (65 FE) MG EC tablet Take 1 tablet (325 mg total) by mouth in the morning and at bedtime. 05/22/21  Yes Mayers, Cari S, PA-C  gabapentin (NEURONTIN) 100 MG capsule Take 1 capsule (100 mg total) by mouth 3 (three) times daily. 09/27/21  Yes Charlott Rakes, MD  hydrALAZINE (APRESOLINE) 50 MG tablet Take 1 tablet (50 mg total) by mouth 3 (three) times daily. 01/08/22  Yes Charlott Rakes, MD  letrozole (FEMARA) 2.5 MG tablet  Take 1 tablet (2.5 mg total) by mouth daily. 12/15/21  Yes Nicholas Lose, MD  metFORMIN (GLUCOPHAGE) 500 MG tablet Take 1 tablet (500 mg total) by mouth daily with breakfast. TAKE ONE TABLET BY MOUTH TWICE A DAY WITH A MEAL Patient taking differently: Take 500 mg by mouth daily with breakfast. 01/08/22  Yes Charlott Rakes, MD  Multiple Vitamin (MULTIVITAMIN WITH MINERALS) TABS tablet Take 1 tablet by mouth daily. 09/09/21  Yes Ghimire, Henreitta Leber, MD  ondansetron (ZOFRAN) 8 MG tablet Take 1 tablet (8 mg total) by mouth every 8 (eight) hours as needed for nausea or vomiting. 09/28/21  Yes Nicholas Lose, MD  polyethylene glycol (MIRALAX / GLYCOLAX) 17 g packet Take 17 g by mouth daily.   Yes Nicholas Lose, MD  Accu-Chek FastClix Lancets MISC Check blood sugar fasting and before meals and again if pt feels bad (symptoms of hypo). 09/20/21   Charlott Rakes, MD  Blood Glucose Monitoring Suppl (ACCU-CHEK GUIDE) w/Device KIT 1 each by Does not apply route as directed. 08/25/18   Fulp, Cammie, MD  glucose blood (ACCU-CHEK GUIDE) test strip USE TO CHECK FASTING BLOOD SUGAR AND BEFORE MEALS AND AGAIN IF PATIENT FEELS BAD; SYMPTOMS OF HYPOGLYCEMIA 09/20/21   Charlott Rakes, MD  prochlorperazine (COMPAZINE) 10 MG tablet Take 1 tablet (10 mg total) by mouth every 6 (six) hours as needed for nausea or vomiting. Patient not taking: Reported on 02/15/2022 09/28/21   Nicholas Lose, MD     Vital Signs: BP 138/68  Pulse 93   Temp 98.5 F (36.9 C) (Oral)   Resp (!) 21   Ht 5' 2"  (1.575 m)   Wt 138 lb 6.4 oz (62.8 kg)   SpO2 96%   BMI 25.31 kg/m   Physical Exam awake/alert; left chest drains intact, mildly tender, both to wall suction, no obvious air leaks; OP from IR drain 1.3 liters yesterday, minimal today serosang fluid  Imaging: DG CHEST PORT 1 VIEW  Result Date: 02/22/2022 CLINICAL DATA:  Left pleural effusion EXAM: PORTABLE CHEST 1 VIEW COMPARISON:  Previous studies including the examination of 02/21/2022  FINDINGS: Transverse diameter heart is increased. There is moderate left pleural effusion. Two left chest tubes are noted. Evaluation of left mid and left lower lung fields for infiltrates is limited by the effusion. There is no pneumothorax. Right lung is clear. IMPRESSION: No significant changes are noted in moderate left pleural effusion. Possible heterotopic phases/pneumonia in left mid and left lower lung fields. Right lung is clear. Electronically Signed   By: Elmer Picker M.D.   On: 02/22/2022 08:38   US RENAL  Result Date: 02/21/2022 CLINICAL DATA:  Renal dysfunction EXAM: RENAL / URINARY TRACT ULTRASOUND COMPLETE COMPARISON:  CT done on 02/18/2022 FINDINGS: Right Kidney: Renal measurements: 9.2 x 4.7 x 4.6 cm = volume: 102.7 mL. There is no hydronephrosis. There is increased echogenicity. There are few cysts in the left kidney in the upper pole and midportion each measuring less than 7 mm. Left Kidney: Renal measurements: 9.8 x 4.7 x 4.6 cm = volume: 109.7 mL. There is no hydronephrosis. There is increased echogenicity. There is 11 mm smoothly marginated hypoechoic lesion in the mid to lower portion of left kidney, possibly a cyst. Bladder: Appears normal for degree of bladder distention. Other: Technologist observed small left pleural effusion. IMPRESSION: There is no hydronephrosis. Increased cortical echogenicity suggests medical renal disease. There are few bilateral renal cysts. Electronically Signed   By: Elmer Picker M.D.   On: 02/21/2022 20:18   DG CHEST PORT 1 VIEW  Result Date: 02/21/2022 CLINICAL DATA:  Pleural effusion EXAM: PORTABLE CHEST 1 VIEW COMPARISON:  02/21/2022, 02/20/2022 FINDINGS: PleurX catheter at left base. Interim placement of pigtail drainage catheter over the left mid chest. Decreased left pleural effusion with small moderate residual. Airspace disease at left base. Right lung grossly clear. Borderline cardiomegaly with aortic atherosclerosis. Possible  small amount of air within the left lower lateral pleural space. Aortic atherosclerosis. Electronic device over the left chest. IMPRESSION: Interim placement second drainage catheter on the left with decreased left pleural effusion. Small moderate residual left pleural effusion and airspace disease at the left base. Electronically Signed   By: Donavan Foil M.D.   On: 02/21/2022 17:59   CT IMAGE GUIDED DRAINAGE BY PERCUTANEOUS CATHETER  Result Date: 02/21/2022 INDICATION: 76 year old woman with history of metastatic breast cancer and loculated left pleural effusion presents to IR for chest tube placement. Patient has a left PleurX catheter, however drainage has been minimal. EXAM: CT-guided left chest tube placement MEDICATIONS: Fentanyl IV 50 mcg ANESTHESIA/SEDATION: None COMPLICATIONS: None immediate. TECHNIQUE: Informed written consent was obtained from the patient after a thorough discussion of the procedural risks, benefits and alternatives. All questions were addressed. Maximal Sterile Barrier Technique was utilized including caps, mask, sterile gowns, sterile gloves, sterile drape, hand hygiene and skin antiseptic. A timeout was performed prior to the initiation of the procedure. PROCEDURE: The left posterior chest wall skin was prepped with chlorhexidine in a sterile fashion, and  a sterile drape was applied covering the operative field. A sterile gown and sterile gloves were used for the procedure. Local anesthesia was provided with 1% Lidocaine. Following local lidocaine administration, the left pleural fluid was accessed with a 19 gauge Yueh needle utilizing CT guidance. The catheter was removed over 0.035 inch guidewire. Tract dilation was performed with 10 French dilator. 12.0 [place] dilator could not be advanced over the guidewire, likely due to narrow inter costal spaces. 12.0 [place] multipurpose pigtail drain would not track either. 10.2 [place] multipurpose pigtail drain was successfully inserted  into the left pleural space. Final position of the pigtail was confirmed with CT. Drain attached to Pleur-Evac set at -20 cm H2O. Drain secured to skin with suture. FINDINGS: Large left pleural effusion IMPRESSION: 10.2 French multipurpose pigtail drain inserted into left pleural effusion. Unable to track larger drain into left pleural space, likely due to narrow intercostal spaces. Electronically Signed   By: Miachel Roux M.D.   On: 02/21/2022 15:37   DG CHEST PORT 1 VIEW  Result Date: 02/21/2022 CLINICAL DATA:  [id number] patient with chronic kidney disease 3, diabetes, hypertension, metastatic breast cancer on treatment, left pleural effusion and status post PleurX catheter insertion EXAM: PORTABLE CHEST 1 VIEW COMPARISON:  February 20, 2022 FINDINGS: Again seen is the PleurX catheter at the left lung base. There has been complete opacification of the left thorax in comparison to lower 3/4 in the previous study of pleural effusion. Leftward deviation of the mediastinum. Right lung remains clear. The visualized skeletal structures are unremarkable. IMPRESSION: There has been complete opacification of the left thorax of pleural effusion and has worsened in the interim. Stable left basal PleurX catheter. Right lung remains clear. Electronically Signed   By: Frazier Richards M.D.   On: 02/21/2022 09:37   DG Chest Port 1 View  Result Date: 02/20/2022 CLINICAL DATA:  Malignant left pleural effusion. EXAM: PORTABLE CHEST 1 VIEW COMPARISON:  Chest two views 02/18/2022, CT chest 02/18/2022 FINDINGS: Lung volumes are mildly decreased compared to prior. There is again homogeneous opacification of the majority of the inferior 2/3 of the left hemithorax corresponding to the lobulated and likely loculated moderate to large left pleural effusion better seen on prior CT. The right lung appears clear. No pneumothorax is seen. The visualized portions of the cardiac silhouette and mediastinal contours are unchanged. Left basilar  drainage catheter appears unchanged. IMPRESSION: Mildly decreased lung volumes compared to prior. Within this limitation, there is no significant change to possibly mild interval increase in the size of moderate-to-large loculated left pleural effusion better seen on prior CT. Electronically Signed   By: Yvonne Kendall M.D.   On: 02/20/2022 09:09   CT Angio Chest PE W and/or Wo Contrast  Result Date: 02/18/2022 CLINICAL DATA:  Pulmonary embolus suspected with high probability. History of metastatic breast cancer. Today vomiting and fever. Currently on oral chemotherapy. EXAM: CT ANGIOGRAPHY CHEST CT ABDOMEN AND PELVIS WITH CONTRAST TECHNIQUE: Multidetector CT imaging of the chest was performed using the standard protocol during bolus administration of intravenous contrast. Multiplanar CT image reconstructions and MIPs were obtained to evaluate the vascular anatomy. Multidetector CT imaging of the abdomen and pelvis was performed using the standard protocol during bolus administration of intravenous contrast. RADIATION DOSE REDUCTION: This exam was performed according to the departmental dose-optimization program which includes automated exposure control, adjustment of the mA and/or kV according to patient size and/or use of iterative reconstruction technique. CONTRAST:  75m OMNIPAQUE IOHEXOL 350 MG/ML SOLN  COMPARISON:  PET-CT 09/29/2021.  CT chest 01/15/2022 FINDINGS: CTA CHEST FINDINGS Cardiovascular: Evaluation of the pulmonary arteries is limited by suboptimal contrast bolus and motion artifact. As visualized, there are no significant filling defects in the central pulmonary arteries suggesting no large central pulmonary embolus. Heart size is normal. No pericardial effusion. Normal caliber thoracic aorta. No aortic dissection. Calcification of the aorta and coronary arteries. Mediastinum/Nodes: The esophagus is decompressed. Subcentimeter right thyroid gland nodule is unchanged since prior studies. Hilar,  subcarinal, and left aortopulmonic window lymph nodes are similar to prior study with left AP window nodes measuring 1.6 cm in short axis dimension. This corresponds to known metastatic disease. Lungs/Pleura: There is a large left pleural effusion with somewhat nodular appearance likely representing a malignant effusion. This is significantly increased in size since the prior study despite presence of a chest drain. Atelectasis in the left lung. Right lung is clear. Airways are patent. Musculoskeletal: Degenerative changes in the spine. Heterogeneous areas of bone sclerosis and destruction in the sternum and in several midthoracic vertebra, likely representing bone metastasis. This corresponds to changes on prior PET-CT and CT. Large soft tissue mass in the left breast corresponding to known neoplasm. Skin thickening and infiltration could be neoplastic or radiation related. Lymphadenopathy in the left axilla consistent with metastatic disease. Review of the MIP images confirms the above findings. CT ABDOMEN and PELVIS FINDINGS Hepatobiliary: No focal liver abnormality is seen. No gallstones, gallbladder wall thickening, or biliary dilatation. Pancreas: Unremarkable. No pancreatic ductal dilatation or surrounding inflammatory changes. Spleen: Normal in size without focal abnormality. Adrenals/Urinary Tract: No adrenal gland nodules. Subcentimeter simple appearing cysts in both kidneys consistent with benign cyst. No imaging follow-up is indicated for these typically benign lesions. Renal nephrograms are symmetrical. No hydronephrosis or hydroureter. Bladder is normal. Stomach/Bowel: Stomach is within normal limits. Appendix appears normal. No evidence of bowel wall thickening, distention, or inflammatory changes. Vascular/Lymphatic: Scattered lymph nodes in the celiac axis and retroperitoneum without pathologic enlargement. Diffuse calcification of the aorta. No aneurysm. Reproductive: No abnormal pelvic masses.  Other: No free air or free fluid in the abdomen. Abdominal wall musculature appears intact. Musculoskeletal: No destructive bone lesions. Review of the MIP images confirms the above findings. IMPRESSION: 1. Examination of the pulmonary arteries is technically limited but no significant central pulmonary embolus is identified. 2. Large left pleural effusion with left lung atelectasis demonstrates significant progression since previous study. A left chest tube is in place. 3. Metastatic lymphadenopathy in the mediastinum and hilar regions as well as bone metastases are unchanged. Left breast mass and left axillary lymphadenopathy again demonstrated. 4. No acute process demonstrated in the abdomen or pelvis. Electronically Signed   By: Lucienne Capers M.D.   On: 02/18/2022 17:58   CT ABDOMEN PELVIS W CONTRAST  Result Date: 02/18/2022 CLINICAL DATA:  Pulmonary embolus suspected with high probability. History of metastatic breast cancer. Today vomiting and fever. Currently on oral chemotherapy. EXAM: CT ANGIOGRAPHY CHEST CT ABDOMEN AND PELVIS WITH CONTRAST TECHNIQUE: Multidetector CT imaging of the chest was performed using the standard protocol during bolus administration of intravenous contrast. Multiplanar CT image reconstructions and MIPs were obtained to evaluate the vascular anatomy. Multidetector CT imaging of the abdomen and pelvis was performed using the standard protocol during bolus administration of intravenous contrast. RADIATION DOSE REDUCTION: This exam was performed according to the departmental dose-optimization program which includes automated exposure control, adjustment of the mA and/or kV according to patient size and/or use of iterative reconstruction technique.  CONTRAST:  27m OMNIPAQUE IOHEXOL 350 MG/ML SOLN COMPARISON:  PET-CT 09/29/2021.  CT chest 01/15/2022 FINDINGS: CTA CHEST FINDINGS Cardiovascular: Evaluation of the pulmonary arteries is limited by suboptimal contrast bolus and motion  artifact. As visualized, there are no significant filling defects in the central pulmonary arteries suggesting no large central pulmonary embolus. Heart size is normal. No pericardial effusion. Normal caliber thoracic aorta. No aortic dissection. Calcification of the aorta and coronary arteries. Mediastinum/Nodes: The esophagus is decompressed. Subcentimeter right thyroid gland nodule is unchanged since prior studies. Hilar, subcarinal, and left aortopulmonic window lymph nodes are similar to prior study with left AP window nodes measuring 1.6 cm in short axis dimension. This corresponds to known metastatic disease. Lungs/Pleura: There is a large left pleural effusion with somewhat nodular appearance likely representing a malignant effusion. This is significantly increased in size since the prior study despite presence of a chest drain. Atelectasis in the left lung. Right lung is clear. Airways are patent. Musculoskeletal: Degenerative changes in the spine. Heterogeneous areas of bone sclerosis and destruction in the sternum and in several midthoracic vertebra, likely representing bone metastasis. This corresponds to changes on prior PET-CT and CT. Large soft tissue mass in the left breast corresponding to known neoplasm. Skin thickening and infiltration could be neoplastic or radiation related. Lymphadenopathy in the left axilla consistent with metastatic disease. Review of the MIP images confirms the above findings. CT ABDOMEN and PELVIS FINDINGS Hepatobiliary: No focal liver abnormality is seen. No gallstones, gallbladder wall thickening, or biliary dilatation. Pancreas: Unremarkable. No pancreatic ductal dilatation or surrounding inflammatory changes. Spleen: Normal in size without focal abnormality. Adrenals/Urinary Tract: No adrenal gland nodules. Subcentimeter simple appearing cysts in both kidneys consistent with benign cyst. No imaging follow-up is indicated for these typically benign lesions. Renal  nephrograms are symmetrical. No hydronephrosis or hydroureter. Bladder is normal. Stomach/Bowel: Stomach is within normal limits. Appendix appears normal. No evidence of bowel wall thickening, distention, or inflammatory changes. Vascular/Lymphatic: Scattered lymph nodes in the celiac axis and retroperitoneum without pathologic enlargement. Diffuse calcification of the aorta. No aneurysm. Reproductive: No abnormal pelvic masses. Other: No free air or free fluid in the abdomen. Abdominal wall musculature appears intact. Musculoskeletal: No destructive bone lesions. Review of the MIP images confirms the above findings. IMPRESSION: 1. Examination of the pulmonary arteries is technically limited but no significant central pulmonary embolus is identified. 2. Large left pleural effusion with left lung atelectasis demonstrates significant progression since previous study. A left chest tube is in place. 3. Metastatic lymphadenopathy in the mediastinum and hilar regions as well as bone metastases are unchanged. Left breast mass and left axillary lymphadenopathy again demonstrated. 4. No acute process demonstrated in the abdomen or pelvis. Electronically Signed   By: WLucienne CapersM.D.   On: 02/18/2022 17:58   CT Head Wo Contrast  Result Date: 02/18/2022 CLINICAL DATA:  History of metastatic breast cancer presents with vomiting and fever. Currently on oral chemotherapy. One episode emesis. EXAM: CT HEAD WITHOUT CONTRAST CT CERVICAL SPINE WITHOUT CONTRAST TECHNIQUE: Multidetector CT imaging of the head and cervical spine was performed following the standard protocol without intravenous contrast. Multiplanar CT image reconstructions of the cervical spine were also generated. RADIATION DOSE REDUCTION: This exam was performed according to the departmental dose-optimization program which includes automated exposure control, adjustment of the mA and/or kV according to patient size and/or use of iterative reconstruction  technique. COMPARISON:  Head CT 05/15/2018 FINDINGS: CT HEAD FINDINGS Brain: Ventricles, cisterns and other CSF spaces  are within normal. Mild chronic ischemic microvascular disease. Old left occipital infarct. No mass, mass effect or shift of midline structures. No acute hemorrhage. No definite acute infarction. Vascular: No hyperdense vessel or unexpected calcification. Skull: Normal. Negative for fracture or focal lesion. Sinuses/Orbits: No acute finding. Other: None. CT CERVICAL SPINE FINDINGS Alignment: Normal. Skull base and vertebrae: Mild to moderate spondylosis throughout the cervical spine to include facet arthropathy and uncovertebral joint spurring. Atlantoaxial articulation is unremarkable. Minimal bilateral neural foraminal narrowing at the C5-6 level and mild left-sided neural foraminal narrowing at the C6-7 level. No acute fracture. Soft tissues and spinal canal: Prevertebral soft tissues are normal. No significant canal stenosis. Disc levels: Disc space narrowing at the C5-6 and C6-7 levels and to lesser extent at the C4-5 and C7-T1 levels. Upper chest: No acute findings. Other: None. IMPRESSION: 1. No acute brain injury. 2. 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 3. No acute cervical spine injury. 4. Mild to moderate spondylosis throughout the cervical spine with multilevel disc disease and neural foraminal narrowing as described. Electronically Signed   By: Marin Olp M.D.   On: 02/18/2022 15:12   CT Cervical Spine Wo Contrast  Result Date: 02/18/2022 CLINICAL DATA:  History of metastatic breast cancer presents with vomiting and fever. Currently on oral chemotherapy. One episode emesis. EXAM: CT HEAD WITHOUT CONTRAST CT CERVICAL SPINE WITHOUT CONTRAST TECHNIQUE: Multidetector CT imaging of the head and cervical spine was performed following the standard protocol without intravenous contrast. Multiplanar CT image reconstructions of the cervical spine were  also generated. RADIATION DOSE REDUCTION: This exam was performed according to the departmental dose-optimization program which includes automated exposure control, adjustment of the mA and/or kV according to patient size and/or use of iterative reconstruction technique. COMPARISON:  Head CT 05/15/2018 FINDINGS: CT HEAD FINDINGS Brain: Ventricles, cisterns and other CSF spaces are within normal. Mild chronic ischemic microvascular disease. Old left occipital infarct. No mass, mass effect or shift of midline structures. No acute hemorrhage. No definite acute infarction. Vascular: No hyperdense vessel or unexpected calcification. Skull: Normal. Negative for fracture or focal lesion. Sinuses/Orbits: No acute finding. Other: None. CT CERVICAL SPINE FINDINGS Alignment: Normal. Skull base and vertebrae: Mild to moderate spondylosis throughout the cervical spine to include facet arthropathy and uncovertebral joint spurring. Atlantoaxial articulation is unremarkable. Minimal bilateral neural foraminal narrowing at the C5-6 level and mild left-sided neural foraminal narrowing at the C6-7 level. No acute fracture. Soft tissues and spinal canal: Prevertebral soft tissues are normal. No significant canal stenosis. Disc levels: Disc space narrowing at the C5-6 and C6-7 levels and to lesser extent at the C4-5 and C7-T1 levels. Upper chest: No acute findings. Other: None. IMPRESSION: 1. No acute brain injury. 2. 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 3. No acute cervical spine injury. 4. Mild to moderate spondylosis throughout the cervical spine with multilevel disc disease and neural foraminal narrowing as described. Electronically Signed   By: Marin Olp M.D.   On: 02/18/2022 15:12   DG Chest 2 View  Result Date: 02/18/2022 CLINICAL DATA:  fx/sob EXAM: CHEST - 2 VIEW COMPARISON:  Chest x-ray 09/07/2021, CT chest 01/15/2022 FINDINGS: The heart and mediastinal contours are unchanged.  Aortic calcification. Left upper lobe hazy airspace opacity. No pulmonary edema. Interval increase in size of a possibly loculated, moderate volume left pleural effusion. No right pleural effusion. No pneumothorax. No acute osseous abnormality. IMPRESSION: 1. Interval increase in size of a possibly loculated,  moderate volume left pleural effusion. 2. Left upper lobe hazy airspace opacity may represent combination of atelectasis versus infection/inflammation. 3.  Aortic Atherosclerosis (ICD10-I70.0). Electronically Signed   By: Iven Finn M.D.   On: 02/18/2022 15:07    Labs:  CBC: Recent Labs    02/19/22 0500 02/20/22 0524 02/21/22 0538 02/22/22 0434  WBC 6.4 6.6 6.8 5.6  HGB 8.1* 8.1* 8.1* 7.9*  HCT 25.5* 25.8* 25.1* 23.9*  PLT 246 245 249 271    COAGS: No results for input(s): "INR", "APTT" in the last 8760 hours.  BMP: Recent Labs    02/19/22 0500 02/20/22 0524 02/21/22 0538 02/21/22 2001 02/22/22 0434  NA 137 138 137  --  136  K 4.0 4.4 4.8  --  4.5  CL 109 110 109  --  107  CO2 20* 22 21*  --  22  GLUCOSE 105* 153* 169* 255* 173*  BUN 21 28* 38*  --  32*  CALCIUM 8.8* 9.1 9.1  --  8.5*  CREATININE 1.41* 1.61* 1.95*  --  1.36*  GFRNONAA 39* 33* 26*  --  40*    LIVER FUNCTION TESTS: Recent Labs    12/21/21 1032 01/18/22 1001 02/15/22 1037 02/18/22 1515  BILITOT 0.3 0.3 0.3 0.3  AST 19 18 16 18   ALT 15 14 12 15   ALKPHOS 60 56 59 50  PROT 7.7 7.7 7.2 7.5  ALBUMIN 4.2 4.2 4.0 3.6    Assessment and Plan: 76 y.o. female with CKD stage 3 and metastatic breast CA currently on chemo with recurrent left pleural effusion s/p left PleurX placement 09/07/21 by pulmonary disease who presents with malfunctioning left chest PleurX, loculated left pleural effusion and fever. Patient underwent tPA dwell x 2, still no drainage from the PleurX; s/p left 10 fr chest drain 7/26; afebrile now but T max 101 yesterday evening, WBC nl, hgb 7.9(8.1), creat 1.36(1.95), 1.3 liter OP  yesterday after placement; CXR today:  No significant changes are noted in moderate left pleural effusion. Possible heterotopic phases/pneumonia in left mid and left lower lung fields. Right lung is clear  Further plans as per CCM   Electronically Signed: D. Rowe Robert, PA-C 02/22/2022, 10:05 AM   I spent a total of 15 Minutes at the the patient's bedside AND on the patient's hospital floor or unit, greater than 50% of which was counseling/coordinating care for left chest drain    Patient ID: Heather Rogers, female   DOB: Jul 26, 1946, 76 y.o.   MRN: [id number]

## 2022-02-22 NOTE — Progress Notes (Signed)
PROGRESS NOTE    Heather Rogers  JXB:[id number] DOB: 1945-10-24 DOA: 02/18/2022 PCP: Charlott Rakes, MD   Brief Narrative: 76 year old with past medical history significant for metastatic left breast cancer (on active treatment with [name] and letrozole), with malignant left pleural effusion post Pleurx catheter placement on 07/07/2022, history of CVA, CKD stage IIIb, anemia of chronic disease, diabetes type 2, hypertension, hyperlipidemia presented with fevers and dyspnea.  She also had a syncope episode and reported increased output from her Pleurx catheter.  Patient was noted to have fever of 101.  CT head was negative.  CTA chest showed a large left pleural effusion with atelectasis and new left breast mass with lymphadenopathy.  CT spine, CT abdomen and pelvis was negative.  Patient was a started on IV vancomycin and cefepime.  Subsequently vancomycin was discontinued.  CCM was consulted and patient underwent instillation of fibrinolytics on 7/24.  Chest x-ray repeated 7/26 showed complete opacification of the left lung.  IR consulted for second  left side chest tube placement, at higher position, placed on 7/26.     Assessment & Plan:   Principal Problem:   Malignant left pleural effusion Active Problems:   Syncope   Acute respiratory failure with hypoxia (HCC)   Primary malignant neoplasm of breast with metastasis (HCC)   Chronic kidney disease, stage 3b (HCC)   Type 2 diabetes mellitus (A1c 6.4 on 2/5)    Hypertension associated with diabetes ([place])   History of CVA   Anemia of chronic disease   Hyperlipidemia associated with type 2 diabetes mellitus (HCC)   1-Malignant left pleural effusion Left side Complicated, loculated pleural effusion.  Acute hypoxic respiratory failure with hypoxia secondary to pleural effusion. #2 left breast cancer.  Patient had a Pleurx catheter as an outpatient since February 2023, started to have decreased output. -Presented with fevers.  CCM  consulted and underwent instillation of fibrinolytics on 7/24. -Continue with IV cefepime -Pleural fluid cultures: Negative -Continue with oxygen supplementation 3 L -Chest x-ray; 7/26 complete opacification of the left side -underwent second chest tube placement at higher position yielding 1.2 L hemorrhagic fluids on 7/26. Chest x ray improved post second chest tube placement. Plan to proceed with CT chest to further evaluate.  Appreciate CCM management.  Cell count from second chest tube pending.   2-AKI on CKD 3 b:  -Baseline  creatinine 1.2-1.49 -Worsening renal function 7/26, creatinine peaked to 1.9 --Renal ultrasound: Negative for hydronephrosis.  Increased cortical echogenicity suggests medical renal disease.  There are a few bilateral renal cysts. -Renal function improved today creatinine down to 1.3 -Improved with IV fluids.    3-Primary malignant neoplasm of breast with metastasis Metastatic left breast cancer with malignant left pleural effusion and osseous involvement. Follow-up with Dr. Payton Mccallum In active treatment with letrozole and Versenio.  Holding due to active infection.   4-Diabetes type 2: Hemoglobin A1c 6.4 Continue to hold metformin while inpatient. Sliding scale insulin  Hypertension associated with diabetes: Continue with amlodipine, Coreg and hydralazine  Hyperlipidemia associated with type 2 diabetes: Continue with atorvastatin  Anemia of chronic disease: Monitor hemoglobin History of CVA: Continue with aspirin and statin  syncope: Likely vasovagal.  Orthostatic negative.  No significant arrhythmia on telemetry     Nutrition Problem: Increased nutrient needs Etiology: chronic illness (malignant pleural effusion)    Signs/Symptoms: estimated needs    Interventions: Ensure Enlive (each supplement provides 350kcal and 20 grams of protein), MVI, Liberalize Diet  Estimated body mass index is 25.[place]  kg/m as calculated from the following:    Height as of this encounter: '5\' 2"'$  (1.575 m).   Weight as of this encounter: 62.8 kg.   DVT prophylaxis: Heparin  Code Status: Full code Family Communication: care discussed with patient Disposition Plan:  Status is: Inpatient Remains inpatient appropriate because: management of loculated pleural effusion.     Consultants:  CCM  Procedures:  Chest tube placement 7/26  Antimicrobials:  Cefepime  Subjective: She is breathing better today   Objective: Vitals:   02/22/22 0142 02/22/22 0500 02/22/22 0522 02/22/22 0911  BP: 127/71  (!) 144/71 138/68  Pulse: 88  93   Resp: (!) 21  (!) 21   Temp: 99 F (37.2 C)  98.5 F (36.9 C)   TempSrc:   Oral   SpO2: 96%  96%   Weight:  62.8 kg    Height:        Intake/Output Summary (Last 24 hours) at 02/22/2022 1338 Last data filed at 02/22/2022 0912 Gross per 24 hour  Intake 595.87 ml  Output 1690 ml  Net -1094.13 ml    Filed Weights   02/20/22 0500 02/21/22 0601 02/22/22 0500  Weight: 66.5 kg 47.6 kg 62.8 kg    Examination:  General exam: NAD Respiratory system: BL air movement Cardiovascular system: S 1,. S 2 RRR  Gastrointestinal system: BS present, soft, nt Central nervous system: Alert and oriented Extremities: Symmetric 5 x 5 power.   Data Reviewed: I have personally reviewed following labs and imaging studies  CBC: Recent Labs  Lab 02/18/22 1515 02/19/22 0500 02/20/22 0524 02/21/22 0538 02/22/22 0434  WBC 7.8 6.4 6.6 6.8 5.6  NEUTROABS 6.0  --   --   --   --   HGB 9.5* 8.1* 8.1* 8.1* 7.9*  HCT 29.3* 25.5* 25.8* 25.1* 23.9*  MCV 80.3 81.0 82.4 80.7 79.4*  PLT 306 246 245 249 563    Basic Metabolic Panel: Recent Labs  Lab 02/18/22 1515 02/19/22 0500 02/20/22 0524 02/21/22 0538 02/21/22 2001 02/22/22 0434  NA 137 137 138 137  --  136  K 4.0 4.0 4.4 4.8  --  4.5  CL 106 109 110 109  --  107  CO2 24 20* 22 21*  --  22  GLUCOSE 160* 105* 153* 169* 255* 173*  BUN 28* 21 28* 38*  --  32*   CREATININE 1.49* 1.41* 1.61* 1.95*  --  1.36*  CALCIUM 9.6 8.8* 9.1 9.1  --  8.5*  MG  --   --  2.4 2.7*  --   --     GFR: Estimated Creatinine Clearance: 30.7 mL/min (A) (by C-G formula based on SCr of 1.36 mg/dL (H)). Liver Function Tests: Recent Labs  Lab 02/18/22 1515  AST 18  ALT 15  ALKPHOS 50  BILITOT 0.3  PROT 7.5  ALBUMIN 3.6    No results for input(s): "LIPASE", "AMYLASE" in the last 168 hours. No results for input(s): "AMMONIA" in the last 168 hours. Coagulation Profile: No results for input(s): "INR", "PROTIME" in the last 168 hours. Cardiac Enzymes: No results for input(s): "CKTOTAL", "CKMB", "CKMBINDEX", "TROPONINI" in the last 168 hours. BNP (last 3 results) No results for input(s): "PROBNP" in the last 8760 hours. HbA1C: No results for input(s): "HGBA1C" in the last 72 hours. CBG: Recent Labs  Lab 02/21/22 1102 02/21/22 1609 02/21/22 2200 02/22/22 0809 02/22/22 1144  GLUCAP 167* 138* 233* 167* 168*    Lipid Profile: No results for input(s): "CHOL", "  HDL", "LDLCALC", "TRIG", "CHOLHDL", "LDLDIRECT" in the last 72 hours. Thyroid Function Tests: No results for input(s): "TSH", "T4TOTAL", "FREET4", "T3FREE", "THYROIDAB" in the last 72 hours. Anemia Panel: No results for input(s): "VITAMINB12", "FOLATE", "FERRITIN", "TIBC", "IRON", "RETICCTPCT" in the last 72 hours. Sepsis Labs: Recent Labs  Lab 02/18/22 1538 02/18/22 1625 02/19/22 1059 02/20/22 0524 02/21/22 0538  PROCALCITON  --   --  0.23 0.26 0.51  LATICACIDVEN 1.2 1.0  --   --   --      Recent Results (from the past 240 hour(s))  Blood culture (routine x 2)     Status: None (Preliminary result)   Collection Time: 02/18/22  3:14 PM   Specimen: BLOOD  Result Value Ref Range Status   Specimen Description   Final    BLOOD LEFT ANTECUBITAL Performed at [facility], [place] [address] [place], [place] [address]    Special Requests   Final    BOTTLES DRAWN AEROBIC AND  ANAEROBIC Blood Culture results may not be optimal due to an inadequate volume of blood received in culture bottles Performed at [place] [address], [place], [place] [address]    Culture   Final    NO GROWTH 4 DAYS Performed at [facility], [place] [address] [place], [place] [address]    Report Status PENDING  Incomplete  Blood culture (routine x 2)     Status: None (Preliminary result)   Collection Time: 02/18/22  3:30 PM   Specimen: BLOOD  Result Value Ref Range Status   Specimen Description   Final    BLOOD LEFT ANTECUBITAL Performed at [facility] [address] [place], [place] [address]    Special Requests   Final    BLOOD Blood Culture adequate volume Performed at [facility] [address], [place], [place] [address]    Culture   Final    NO GROWTH 4 DAYS Performed at [facility], [place] [address] [place], [place] [address]    Report Status PENDING  Incomplete  SARS Coronavirus 2 by RT PCR (hospital order, performed in [facility] hospital lab) *cepheid single result test*     Status: None   Collection Time: 02/18/22  3:30 PM   Specimen: Nasal Swab  Result Value Ref Range Status   SARS Coronavirus 2 by RT PCR NEGATIVE NEGATIVE Final    Comment: (NOTE) SARS-CoV-2 target nucleic acids are NOT DETECTED.  The SARS-CoV-2 RNA is generally detectable in upper and lower respiratory specimens during the acute phase of infection. The lowest concentration of SARS-CoV-2 viral copies this assay can detect is 250 copies / mL. A negative result does not preclude SARS-CoV-2 infection and should not be used as the sole basis for treatment or other patient management decisions.  A negative result may occur with improper specimen collection / handling, submission of specimen other than nasopharyngeal swab, presence of viral mutation(s) within the areas targeted by this assay, and inadequate number of  viral copies (<250 copies / mL). A negative result must be combined with clinical observations, patient history, and epidemiological information.  Fact Sheet for Patients:   https://www.patel.info/  Fact Sheet for Healthcare Providers: https://hall.com/  This test is not yet approved or  cleared by the [place] FDA and has been authorized for detection and/or diagnosis of SARS-CoV-2 by FDA under an Emergency Use Authorization (EUA).  This EUA will remain in effect (meaning this test can be used)  for the duration of the COVID-19 declaration under Section 564(b)(1) of the Act, 21 U.S.C. section 360bbb-3(b)(1), unless the authorization is terminated or revoked sooner.  Performed at [facility], [place] [address] [place], [place] [address]   Urine Culture     Status: None   Collection Time: 02/18/22  4:17 PM   Specimen: Urine, Clean Catch  Result Value Ref Range Status   Specimen Description   Final    URINE, CLEAN CATCH Performed at [facility], [place] [address], [place], [place] [address]    Special Requests   Final    NONE Performed at [facility], [place] [address] [place], [place] [address]    Culture   Final    NO GROWTH Performed at [facility], [place] [address] [place], [place] [address]    Report Status 02/19/2022 FINAL  Final  Culture, body fluid w Gram Stain-bottle     Status: None (Preliminary result)   Collection Time: 02/18/22  7:04 PM   Specimen: Fluid  Result Value Ref Range Status   Specimen Description FLUID PLEURAL  Final   Special Requests   Final    BOTTLES DRAWN AEROBIC AND ANAEROBIC Blood Culture adequate volume   Culture   Final    NO GROWTH 4 DAYS Performed at [facility], [place] [address], [place], [place] [address]    Report Status PENDING  Incomplete  Gram stain     Status: None   Collection Time: 02/18/22  7:04 PM   Specimen:  Fluid  Result Value Ref Range Status   Specimen Description FLUID PLEURAL  Final   Special Requests NONE  Final   Gram Stain   Final    FEW WBC PRESENT,BOTH PMN AND MONONUCLEAR NO ORGANISMS SEEN Performed at [facility], [address] [place], [place] [address]    Report Status 02/19/2022 FINAL  Final  MRSA Next Gen by PCR, Nasal     Status: None   Collection Time: 02/19/22 12:32 PM   Specimen: Nasal Mucosa; Nasal Swab  Result Value Ref Range Status   MRSA by PCR Next Gen NOT DETECTED NOT DETECTED Final    Comment: (NOTE) The GeneXpert MRSA Assay (FDA approved for NASAL specimens only), is one component of a comprehensive MRSA colonization surveillance program. It is not intended to diagnose MRSA infection nor to guide or monitor treatment for MRSA infections. Test performance is not FDA approved in patients less than 10 years old. Performed at [facility], [place] [address] [place], [place] [address]          Radiology Studies: DG CHEST PORT 1 VIEW  Result Date: 02/22/2022 CLINICAL DATA:  Left pleural effusion EXAM: PORTABLE CHEST 1 VIEW COMPARISON:  Previous studies including the examination of 02/21/2022 FINDINGS: Transverse diameter heart is increased. There is moderate left pleural effusion. Two left chest tubes are noted. Evaluation of left mid and left lower lung fields for infiltrates is limited by the effusion. There is no pneumothorax. Right lung is clear. IMPRESSION: No significant changes are noted in moderate left pleural effusion. Possible heterotopic phases/pneumonia in left mid and left lower lung fields. Right lung is clear. Electronically Signed   By: Elmer Picker M.D.   On: 02/22/2022 08:38   US RENAL  Result Date: 02/21/2022 CLINICAL DATA:  Renal dysfunction EXAM: RENAL / URINARY TRACT ULTRASOUND COMPLETE COMPARISON:  CT done on 02/18/2022 FINDINGS: Right Kidney: Renal measurements: 9.2 x  4.7 x 4.6 cm = volume: 102.7 mL. There  is no hydronephrosis. There is increased echogenicity. There are few cysts in the left kidney in the upper pole and midportion each measuring less than 7 mm. Left Kidney: Renal measurements: 9.8 x 4.7 x 4.6 cm = volume: 109.7 mL. There is no hydronephrosis. There is increased echogenicity. There is 11 mm smoothly marginated hypoechoic lesion in the mid to lower portion of left kidney, possibly a cyst. Bladder: Appears normal for degree of bladder distention. Other: Technologist observed small left pleural effusion. IMPRESSION: There is no hydronephrosis. Increased cortical echogenicity suggests medical renal disease. There are few bilateral renal cysts. Electronically Signed   By: Elmer Picker M.D.   On: 02/21/2022 20:18   DG CHEST PORT 1 VIEW  Result Date: 02/21/2022 CLINICAL DATA:  Pleural effusion EXAM: PORTABLE CHEST 1 VIEW COMPARISON:  02/21/2022, 02/20/2022 FINDINGS: PleurX catheter at left base. Interim placement of pigtail drainage catheter over the left mid chest. Decreased left pleural effusion with small moderate residual. Airspace disease at left base. Right lung grossly clear. Borderline cardiomegaly with aortic atherosclerosis. Possible small amount of air within the left lower lateral pleural space. Aortic atherosclerosis. Electronic device over the left chest. IMPRESSION: Interim placement second drainage catheter on the left with decreased left pleural effusion. Small moderate residual left pleural effusion and airspace disease at the left base. Electronically Signed   By: Donavan Foil M.D.   On: 02/21/2022 17:59   CT IMAGE GUIDED DRAINAGE BY PERCUTANEOUS CATHETER  Result Date: 02/21/2022 INDICATION: 76 year old woman with history of metastatic breast cancer and loculated left pleural effusion presents to IR for chest tube placement. Patient has a left PleurX catheter, however drainage has been minimal. EXAM: CT-guided left chest tube placement MEDICATIONS: Fentanyl IV 50 mcg  ANESTHESIA/SEDATION: None COMPLICATIONS: None immediate. TECHNIQUE: Informed written consent was obtained from the patient after a thorough discussion of the procedural risks, benefits and alternatives. All questions were addressed. Maximal Sterile Barrier Technique was utilized including caps, mask, sterile gowns, sterile gloves, sterile drape, hand hygiene and skin antiseptic. A timeout was performed prior to the initiation of the procedure. PROCEDURE: The left posterior chest wall skin was prepped with chlorhexidine in a sterile fashion, and a sterile drape was applied covering the operative field. A sterile gown and sterile gloves were used for the procedure. Local anesthesia was provided with 1% Lidocaine. Following local lidocaine administration, the left pleural fluid was accessed with a 19 gauge Yueh needle utilizing CT guidance. The catheter was removed over 0.035 inch guidewire. Tract dilation was performed with 10 French dilator. 12.0 [place] dilator could not be advanced over the guidewire, likely due to narrow inter costal spaces. 12.0 [place] multipurpose pigtail drain would not track either. 10.2 [place] multipurpose pigtail drain was successfully inserted into the left pleural space. Final position of the pigtail was confirmed with CT. Drain attached to Pleur-Evac set at -20 cm H2O. Drain secured to skin with suture. FINDINGS: Large left pleural effusion IMPRESSION: 10.2 French multipurpose pigtail drain inserted into left pleural effusion. Unable to track larger drain into left pleural space, likely due to narrow intercostal spaces. Electronically Signed   By: Miachel Roux M.D.   On: 02/21/2022 15:37   DG CHEST PORT 1 VIEW  Result Date: 02/21/2022 CLINICAL DATA:  [id number] patient with chronic kidney disease 3, diabetes, hypertension, metastatic breast cancer on treatment, left pleural effusion and status post PleurX catheter insertion EXAM: PORTABLE CHEST 1 VIEW COMPARISON:  February 20, 2022 FINDINGS:  Again seen is the PleurX catheter at the left lung base. There has been complete opacification of the left thorax in comparison to lower 3/4 in the previous study of pleural effusion. Leftward deviation of the mediastinum. Right lung remains clear. The visualized skeletal structures are unremarkable. IMPRESSION: There has been complete opacification of the left thorax of pleural effusion and has worsened in the interim. Stable left basal PleurX catheter. Right lung remains clear. Electronically Signed   By: Frazier Richards M.D.   On: 02/21/2022 09:37        Scheduled Meds:  amLODipine  10 mg Oral Daily   aspirin EC  81 mg Oral Daily   atorvastatin  80 mg Oral QPM   carvedilol  25 mg Oral BID WC   feeding supplement  237 mL Oral BID BM   gabapentin  100 mg Oral TID   heparin  5,000 Units Subcutaneous Q8H   hydrALAZINE  50 mg Oral TID   insulin aspart  0-9 Units Subcutaneous TID WC   multivitamin with minerals  1 tablet Oral Daily   polyethylene glycol  17 g Oral Daily   sodium chloride flush  10 mL Intrapleural Q8H   sodium chloride flush  3 mL Intravenous Q12H   Continuous Infusions:  sodium chloride 50 mL/hr at 02/22/22 1327   ceFEPime (MAXIPIME) IV Stopped (02/21/22 1622)     LOS: 4 days    Time spent: 35 minutes    Azel Gumina A Traycen Goyer, MD [facility]   If 7PM-7AM, please contact night-coverage [email or web address]  02/22/2022, 1:38 PM

## 2022-02-22 NOTE — Evaluation (Signed)
Occupational Therapy Evaluation Patient Details Name: Heather Rogers MRN: [id number] DOB: 10/01/1945 Today's Date: 02/22/2022   History of Present Illness 76 yo female admitted with malignant pleural effusion s/p chest tube and pleural fibrinolysis 7/24. Hx of pleurx cath placement 09/07/21, CVA, DM, met breast cancer, CKD   Clinical Impression   Heather Rogers is a 76 year old woman who presents now with 2 left sided chest tubes, pain, decreased activity tolerance and impaired balance resulting in a sudden decline in functional abilities. Patient currently limited to [facility] transfer and toileting task on evaluation and needing increased assistance for ADLs. Patient will benefit from skilled OT services while in hospital to improve deficits and learn compensatory strategies as needed in order to return to PLOF.  Family still wanting to take patient home. Daughter will be present daily to every other day to assist and grandson present when not working.      Recommendations for follow up therapy are one component of a multi-disciplinary discharge planning process, led by the attending physician.  Recommendations may be updated based on patient status, additional functional criteria and insurance authorization.   Follow Up Recommendations  Home health OT    Assistance Recommended at Discharge Frequent or constant Supervision/Assistance  Patient can return home with the following A little help with walking and/or transfers;A little help with bathing/dressing/bathroom;Assistance with cooking/housework    Functional Status Assessment  Patient has had a recent decline in their functional status and demonstrates the ability to make significant improvements in function in a reasonable and predictable amount of time.  Equipment Recommendations  BSC/3in1    Recommendations for Other Services       Precautions / Restrictions Precautions Precautions: Fall Precaution Comments: 2 (L) chest  tubes, keep sats > 92% Restrictions Weight Bearing Restrictions: No      Mobility Bed Mobility Overal bed mobility: Needs Assistance Bed Mobility: Supine to Sit, Sit to Supine     Supine to sit: Min guard, HOB elevated Sit to supine: Min assist        Transfers Overall transfer level: Needs assistance Equipment used: None Transfers: Sit to/from Stand, Bed to chair/wheelchair/BSC Sit to Stand: Min assist Stand pivot transfers: Min assist         General transfer comment: Min assist to pivot to and from [facility].      Balance Overall balance assessment: Mild deficits observed, not formally tested                                         ADL either performed or assessed with clinical judgement   ADL Overall ADL's : Needs assistance/impaired Eating/Feeding: Independent   Grooming: Set up;Sitting   Upper Body Bathing: Set up;Sitting   Lower Body Bathing: Moderate assistance;Sit to/from stand   Upper Body Dressing : Minimal assistance;Sitting   Lower Body Dressing: Sit to/from stand;Maximal assistance   Toilet Transfer: Minimal assistance;BSC/3in1   Toileting- Clothing Manipulation and Hygiene: Moderate assistance;Sit to/from stand Toileting - Clothing Manipulation Details (indicate cue type and reason): assistance for clothing management     Functional mobility during ADLs: Minimal assistance General ADL Comments: Min assist to steady and transfer to [facility] as well as manage lines/tubes     Vision Patient Visual Report: No change from baseline       Perception     Praxis      Pertinent Vitals/Pain Pain Assessment  Pain Assessment: Faces Faces Pain Scale: Hurts even more Pain Location: Chest tube sites Pain Descriptors / Indicators: Grimacing, Guarding Pain Intervention(s): Monitored during session, Limited activity within patient's tolerance     Hand Dominance Right   Extremity/Trunk Assessment Upper Extremity Assessment Upper  Extremity Assessment: Overall WFL for tasks assessed   Lower Extremity Assessment Lower Extremity Assessment: Defer to PT evaluation   Cervical / Trunk Assessment Cervical / Trunk Assessment: Normal   Communication Communication Communication: No difficulties   Cognition Arousal/Alertness: Awake/alert Behavior During Therapy: WFL for tasks assessed/performed Overall Cognitive Status: Within Functional Limits for tasks assessed                                       General Comments       Exercises     Shoulder Instructions      Home Living Family/patient expects to be discharged to:: Private residence Living Arrangements: Other relatives Available Help at Discharge: Family;Available PRN/intermittently Type of Home: House Home Access: Ramped entrance     Home Layout: One level     Bathroom Shower/Tub: Teacher, early years/pre: Standard Bathroom Accessibility: Yes   Home Equipment: BSC/3in1          Prior Functioning/Environment Prior Level of Function : Independent/Modified Independent                        OT Problem List: Decreased activity tolerance;Cardiopulmonary status limiting activity;Pain;Impaired balance (sitting and/or standing);Decreased knowledge of use of DME or AE      OT Treatment/Interventions: Self-care/ADL training;DME and/or AE instruction;Therapeutic activities;Balance training;Patient/family education    OT Goals(Current goals can be found in the care plan section) Acute Rehab OT Goals Patient Stated Goal: walk to bathroom OT Goal Formulation: With family Time For Goal Achievement: 03/08/22 Potential to Achieve Goals: Fair  OT Frequency: Min 2X/week    Co-evaluation              AM-PAC OT "6 Clicks" Daily Activity     Outcome Measure Help from another person eating meals?: None Help from another person taking care of personal grooming?: A Little Help from another person toileting, which  includes using toliet, bedpan, or urinal?: A Lot Help from another person bathing (including washing, rinsing, drying)?: A Lot Help from another person to put on and taking off regular upper body clothing?: A Little Help from another person to put on and taking off regular lower body clothing?: A Lot 6 Click Score: 16   End of Session Equipment Utilized During Treatment: Oxygen Nurse Communication: Mobility status  Activity Tolerance: Patient limited by pain Patient left: in bed;with call bell/phone within reach;with bed alarm set;with family/visitor present  OT Visit Diagnosis: Pain                Time: 1287-8676 OT Time Calculation (min): 16 min Charges:  OT General Charges $OT Visit: 1 Visit OT Evaluation $OT Eval Low Complexity: 1 Low  Shaniah Baltes, OTR/L [place]  Office [phone] Pager: [place] 02/22/2022, 3:06 PM

## 2022-02-22 NOTE — Plan of Care (Signed)
  Problem: Coping: Goal: Ability to adjust to condition or change in health will improve Outcome: Progressing   Problem: Fluid Volume: Goal: Ability to maintain a balanced intake and output will improve Outcome: Progressing   Problem: Nutritional: Goal: Maintenance of adequate nutrition will improve Outcome: Progressing   Problem: Skin Integrity: Goal: Risk for impaired skin integrity will decrease Outcome: Progressing   Problem: Tissue Perfusion: Goal: Adequacy of tissue perfusion will improve Outcome: Progressing   Problem: Education: Goal: Knowledge of General Education information will improve Description: Including pain rating scale, medication(s)/side effects and non-pharmacologic comfort measures Outcome: Progressing   Problem: Health Behavior/Discharge Planning: Goal: Ability to manage health-related needs will improve Outcome: Progressing   Problem: Clinical Measurements: Goal: Ability to maintain clinical measurements within normal limits will improve Outcome: Progressing Goal: Will remain free from infection Outcome: Progressing Goal: Diagnostic test results will improve Outcome: Progressing Goal: Respiratory complications will improve Outcome: Progressing Goal: Cardiovascular complication will be avoided Outcome: Progressing   Problem: Activity: Goal: Risk for activity intolerance will decrease Outcome: Progressing   Problem: Nutrition: Goal: Adequate nutrition will be maintained Outcome: Progressing

## 2022-02-22 NOTE — Consult Note (Signed)
NAME:  Heather Rogers, MRN:  [id number], DOB:  11-06-1945, LOS: 4 ADMISSION DATE:  02/18/2022, CONSULTATION DATE:  7/24 REFERRING MD:  Dr. Manuella Ghazi, CHIEF COMPLAINT:  SOB   BRIEF  76 year old female with past medical history as below, which is significant for CKD 3, diabetes, hypertension, metastatic breast cancer for which she is currently undergoing treatment with [name] and letrozole.  She has Pleurx catheter on the left which is followed by Dr. Verlee Monte in the pulmonary clinic.  She was last seen in April and at that time was draining the Pleurx every other day.  Output decreased and she was able to drain about every 4 days.  Over the course of a couple of weeks her output has significantly increased to the point where her daughter was draining every 2 to 3 days with outputs around 2-300.  On 7/23 the patient developed fever and suffered a syncopal episode for which she presented to [facility] emergency department.  Upon arrival to the emergency department she was hypoxic requiring 2 L of supplemental oxygen and was febrile to 101F.  Work-up in the emergency department included CT angiogram of the chest demonstrating large pleural effusion on the left with areas concerning for loculation. Pleurx vacuum bottle attached in ED and only drained a small amount of fluid. PCCM consulted for Pleurx and effusion management.    Pertinent  Medical History   has a past medical history of Chronic kidney disease, stage 3b ([place]), Diabetes ([place]), Hypertension, Pneumonia due to COVID-19 virus (07/25/2020), Primary malignant neoplasm of breast with metastasis ([place]) (09/14/2021), Stroke (cerebrum) ([place]), and Stroke ([place]) (05/15/2018).   Significant Hospital Events: Including procedures, antibiotic start and stop dates in addition to other pertinent events   02/18/2022  - wrosening left effusionloculation v , concern for obstruction of baseline left pleurx -> s/p ip fibrinolytucs -f rist dose 7/23 fluid culture 7/23  - covid neg 7/23 MRSA neg 7/23 Blood c/s 7/25 - 3L -  97%. New o2 need.  Per daughter - till Sunday 02/18/22 pleurx was draining well. Same day fever too. Then "passed out" came to ER -> found to have worsening effusion and loculated. Now has reponded to first dose intrapleural fibrinolytic but CXR looks unchnaged. Daughter feels she is a bit more tired today. FEbrile 101/.5F yesteday  2nd DOSE IP LYITC ABG without hypercapnia - patient was sleepy 7/26- still on 3L [place]. No drainage after 2nd dose IP Lytic per RN - only 25cc ovrnight.  Pr daughter [name] - > patient did not turn per protocol and then went to sleep. But daugther also feels -> patient did drain well after 2nd dose. However,  Pleurx -> at bottom.  Still febrile. Eating BF. Oriented and stable per RN. WBC normal 2nd 10 F ches ttube at higher levvel by IR - > drained 1.2L hemorrhagic fluid straight away LHD 186, gluc 137.  Cutlure/grm stain Cytology -   Interim History / Subjective:    7/27 - 96% on room air. No further drainage from 2nd chest tube either. CXR improved and looks like feb 2023 and better than white out and admit cxr. Feels less dyspneic per daughter   Objective   Blood pressure 138/68, pulse 93, temperature 98.5 F (36.9 C), temperature source Oral, resp. rate (!) 21, height '5\' 2"'$  (1.575 m), weight 62.8 kg, SpO2 96 %.        Intake/Output Summary (Last 24 hours) at 02/22/2022 1023 Last data filed at 02/22/2022 0912 Gross per 24  hour  Intake 595.87 ml  Output 1690 ml  Net -1094.13 ml   Filed Weights   02/20/22 0500 02/21/22 0601 02/22/22 0500  Weight: 66.5 kg 47.6 kg 62.8 kg    Examination: General Appearance:  Looks stable. Obese Head:  Normocephalic, without obvious abnormality, atraumatic Eyes:  PERRL - yes, conjunctiva/corneas - muddy     Ears:  Normal external ear canals, both ears Nose:  G tube - no Throat:  ETT TUBE - no , OG tube - no Neck:  Supple,  No enlargement/tenderness/nodules Lungs: Clear  to auscultation bilaterally,  LEFT 83F chest tube and Left Pleurx Heart:  S1 and S2 normal, no murmur, CVP - no.  Pressors - no Abdomen:  Soft, no masses, no organomegaly Genitalia / Rectal:  Not done Extremities:  Extremities- intact Skin:  ntact in exposed areas . Sacral area - not examined Neurologic:  Sedation - none -> RASS - +! Marland Kitchen Moves all 4s - yes. CAM-ICU - neg . Orientation - x3+   AM-ICU - neg . 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 Pleurx placed in February 2023 - Lefft loculated effusion  of above - Present on Admit (worsening loculation + fever) with loss of drainage - s/p f 2 dose of lutic -> 2nd chest tube at higher level 02/21/22 with drainage of hemorhagic exudate LDH 170s/normal glucose   7/27 - improved but either has residual effusion or mass. Left Pleurx (old) not working   Plan Get CT chest without contrast -> based on findings decide on next pleural interventin -> lysis 3rd dose throught 2nd chest tube 83F OR monitor or place 2nd pleurx    Acute hypoxemic respiratory failure secondary to effusion and likely pneumonia.   7/27 - resolved  Plan - can start titrtnia abx to off per triad - Supplemental oxygen to keep O2 sats > 92% (racial pulse ox gap) - Blood culture pending  Metastatic breast Ca - Holding oral chemotherapy  Remainder per primary team  Best Practice (right click and "Reselect all SmartList Selections" daily)   Daughter update at bedside 7/24 and 02/20/22 and on phone 02/21/22  Anti-infectives (From admission, onward)    Start     Dose/Rate Route Frequency Ordered Stop   02/20/22 1600  vancomycin (VANCOCIN) IVPB 1000 mg/200 mL premix  Status:  Discontinued        1,000 mg 200 mL/hr over 60 Minutes Intravenous Every 48 hours 02/18/22 2008 02/20/22 1153   02/19/22 1600  ceFEPIme (MAXIPIME) 2 g in sodium  chloride 0.9 % 100 mL IVPB        2 g 200 mL/hr over 30 Minutes Intravenous Every 24 hours 02/18/22 2004     02/18/22 1530  ceFEPIme (MAXIPIME) 2 g in sodium chloride 0.9 % 100 mL IVPB        2 g 200 mL/hr over 30 Minutes Intravenous  Once 02/18/22 1517 02/18/22 1608   02/18/22 1530  vancomycin (VANCOCIN) IVPB 1000 mg/200 mL premix        1,000 mg 200 mL/hr over 60 Minutes Intravenous  Once 02/18/22 1526 02/18/22 1715        SIGNATURE    Dr. Brand Males, M.D., F.C.C.P,  Pulmonary and Critical Care Medicine Staff Physician, [place] Director - Interstitial Lung Disease  Program  Medical Director - [facility]  [place] at [place], [place], [address]  NPI Number:  NPI #[id number] [place] Number: [id number]  Pager: [phone], If no answer  -> Check AMION or Try [phone] Telephone (clinical office): [phone] Telephone (research): [phone]  10:23 AM 02/22/2022     LABS    PULMONARY Recent Labs  Lab 02/20/22 1155  PHART 7.39  PCO2ART 38  PO2ART 74*  HCO3 23.0  O2SAT 95.7    CBC Recent Labs  Lab 02/20/22 0524 02/21/22 0538 02/22/22 0434  HGB 8.1* 8.1* 7.9*  HCT 25.8* 25.1* 23.9*  WBC 6.6 6.8 5.6  PLT 245 249 271    COAGULATION No results for input(s): "INR" in the last 168 hours.  CARDIAC  No results for input(s): "TROPONINI" in the last 168 hours. No results for input(s): "PROBNP" in the last 168 hours.   CHEMISTRY Recent Labs  Lab 02/18/22 1515 02/19/22 0500 02/20/22 0524 02/21/22 0538 02/21/22 2001 02/22/22 0434  NA 137 137 138 137  --  136  K 4.0 4.0 4.4 4.8  --  4.5  CL 106 109 110 109  --  107  CO2 24 20* 22 21*  --  22  GLUCOSE 160* 105* 153* 169* 255* 173*  BUN 28* 21 28* 38*  --  32*  CREATININE 1.49* 1.41* 1.61* 1.95*  --  1.36*  CALCIUM 9.6 8.8* 9.1 9.1  --  8.5*  MG  --   --  2.4 2.7*  --   --    Estimated Creatinine Clearance: 30.7  mL/min (A) (by C-G formula based on SCr of 1.36 mg/dL (H)).   LIVER Recent Labs  Lab 02/15/22 1037 02/18/22 1515  AST 16 18  ALT 12 15  ALKPHOS 59 50  BILITOT 0.3 0.3  PROT 7.2 7.5  ALBUMIN 4.0 3.6     INFECTIOUS Recent Labs  Lab 02/18/22 1538 02/18/22 1625 02/19/22 1059 02/20/22 0524 02/21/22 0538  LATICACIDVEN 1.2 1.0  --   --   --   PROCALCITON  --   --  0.23 0.26 0.51     ENDOCRINE CBG (last 3)  Recent Labs    02/21/22 1609 02/21/22 2200 02/22/22 0809  GLUCAP 138* 233* 167*         IMAGING x48h  - image(s) personally visualized  -   highlighted in bold DG CHEST PORT 1 VIEW  Result Date: 02/22/2022 CLINICAL DATA:  Left pleural effusion EXAM: PORTABLE CHEST 1 VIEW COMPARISON:  Previous studies including the examination of 02/21/2022 FINDINGS: Transverse diameter heart is increased. There is moderate left pleural effusion. Two left chest tubes are noted. Evaluation of left mid and left lower lung fields for infiltrates is limited by the effusion. There is no pneumothorax. Right lung is clear. IMPRESSION: No significant changes are noted in moderate left pleural effusion. Possible heterotopic phases/pneumonia in left mid and left lower lung fields. Right lung is clear. Electronically Signed   By: Elmer Picker M.D.   On: 02/22/2022 08:38   US RENAL  Result Date: 02/21/2022 CLINICAL DATA:  Renal dysfunction EXAM: RENAL / URINARY TRACT ULTRASOUND COMPLETE COMPARISON:  CT done on 02/18/2022 FINDINGS: Right Kidney: Renal measurements: 9.2 x 4.7 x 4.6 cm = volume: 102.7 mL. There is no hydronephrosis. There is increased echogenicity. There are few cysts in the left kidney in the upper pole and midportion each measuring less than 7 mm. Left Kidney: Renal measurements: 9.8 x 4.7 x 4.6  cm = volume: 109.7 mL. There is no hydronephrosis. There is increased echogenicity. There is 11 mm smoothly marginated hypoechoic lesion in the mid to lower portion of left kidney,  possibly a cyst. Bladder: Appears normal for degree of bladder distention. Other: Technologist observed small left pleural effusion. IMPRESSION: There is no hydronephrosis. Increased cortical echogenicity suggests medical renal disease. There are few bilateral renal cysts. Electronically Signed   By: Elmer Picker M.D.   On: 02/21/2022 20:18   DG CHEST PORT 1 VIEW  Result Date: 02/21/2022 CLINICAL DATA:  Pleural effusion EXAM: PORTABLE CHEST 1 VIEW COMPARISON:  02/21/2022, 02/20/2022 FINDINGS: PleurX catheter at left base. Interim placement of pigtail drainage catheter over the left mid chest. Decreased left pleural effusion with small moderate residual. Airspace disease at left base. Right lung grossly clear. Borderline cardiomegaly with aortic atherosclerosis. Possible small amount of air within the left lower lateral pleural space. Aortic atherosclerosis. Electronic device over the left chest. IMPRESSION: Interim placement second drainage catheter on the left with decreased left pleural effusion. Small moderate residual left pleural effusion and airspace disease at the left base. Electronically Signed   By: Donavan Foil M.D.   On: 02/21/2022 17:59   CT IMAGE GUIDED DRAINAGE BY PERCUTANEOUS CATHETER  Result Date: 02/21/2022 INDICATION: 76 year old woman with history of metastatic breast cancer and loculated left pleural effusion presents to IR for chest tube placement. Patient has a left PleurX catheter, however drainage has been minimal. EXAM: CT-guided left chest tube placement MEDICATIONS: Fentanyl IV 50 mcg ANESTHESIA/SEDATION: None COMPLICATIONS: None immediate. TECHNIQUE: Informed written consent was obtained from the patient after a thorough discussion of the procedural risks, benefits and alternatives. All questions were addressed. Maximal Sterile Barrier Technique was utilized including caps, mask, sterile gowns, sterile gloves, sterile drape, hand hygiene and skin antiseptic. A timeout was  performed prior to the initiation of the procedure. PROCEDURE: The left posterior chest wall skin was prepped with chlorhexidine in a sterile fashion, and a sterile drape was applied covering the operative field. A sterile gown and sterile gloves were used for the procedure. Local anesthesia was provided with 1% Lidocaine. Following local lidocaine administration, the left pleural fluid was accessed with a 19 gauge Yueh needle utilizing CT guidance. The catheter was removed over 0.035 inch guidewire. Tract dilation was performed with 10 French dilator. 12.0 [place] dilator could not be advanced over the guidewire, likely due to narrow inter costal spaces. 12.0 [place] multipurpose pigtail drain would not track either. 10.2 [place] multipurpose pigtail drain was successfully inserted into the left pleural space. Final position of the pigtail was confirmed with CT. Drain attached to Pleur-Evac set at -20 cm H2O. Drain secured to skin with suture. FINDINGS: Large left pleural effusion IMPRESSION: 10.2 French multipurpose pigtail drain inserted into left pleural effusion. Unable to track larger drain into left pleural space, likely due to narrow intercostal spaces. Electronically Signed   By: Miachel Roux M.D.   On: 02/21/2022 15:37   DG CHEST PORT 1 VIEW  Result Date: 02/21/2022 CLINICAL DATA:  [id number] patient with chronic kidney disease 3, diabetes, hypertension, metastatic breast cancer on treatment, left pleural effusion and status post PleurX catheter insertion EXAM: PORTABLE CHEST 1 VIEW COMPARISON:  February 20, 2022 FINDINGS: Again seen is the PleurX catheter at the left lung base. There has been complete opacification of the left thorax in comparison to lower 3/4 in the previous study of pleural effusion. Leftward deviation of the mediastinum. Right lung remains clear. The visualized  skeletal structures are unremarkable. IMPRESSION: There has been complete opacification of the left thorax of pleural effusion and  has worsened in the interim. Stable left basal PleurX catheter. Right lung remains clear. Electronically Signed   By: Frazier Richards M.D.   On: 02/21/2022 09:37

## 2022-02-22 NOTE — Plan of Care (Signed)
  Problem: Education: Goal: Knowledge of General Education information will improve Description Including pain rating scale, medication(s)/side effects and non-pharmacologic comfort measures Outcome: Progressing   Problem: Health Behavior/Discharge Planning: Goal: Ability to manage health-related needs will improve Outcome: Progressing   

## 2022-02-23 LAB — CBC
HCT: 24.1 % — ABNORMAL LOW (ref 36.0–46.0)
Hemoglobin: 7.9 g/dL — ABNORMAL LOW (ref 12.0–15.0)
MCH: 25.9 pg — ABNORMAL LOW (ref 26.0–34.0)
MCHC: 32.8 g/dL (ref 30.0–36.0)
MCV: 79 fL — ABNORMAL LOW (ref 80.0–100.0)
Platelets: 296 10*3/uL (ref 150–400)
RBC: 3.05 MIL/uL — ABNORMAL LOW (ref 3.87–5.11)
RDW: 14.5 % (ref 11.5–15.5)
WBC: 4.7 10*3/uL (ref 4.0–10.5)
nRBC: 0 % (ref 0.0–0.2)

## 2022-02-23 LAB — CULTURE, BLOOD (ROUTINE X 2)
Culture: NO GROWTH
Culture: NO GROWTH
Special Requests: ADEQUATE

## 2022-02-23 LAB — GLUCOSE, CAPILLARY
Glucose-Capillary: 166 mg/dL — ABNORMAL HIGH (ref 70–99)
Glucose-Capillary: 194 mg/dL — ABNORMAL HIGH (ref 70–99)
Glucose-Capillary: 203 mg/dL — ABNORMAL HIGH (ref 70–99)
Glucose-Capillary: 265 mg/dL — ABNORMAL HIGH (ref 70–99)

## 2022-02-23 LAB — BASIC METABOLIC PANEL
Anion gap: 6 (ref 5–15)
BUN: 24 mg/dL — ABNORMAL HIGH (ref 8–23)
CO2: 19 mmol/L — ABNORMAL LOW (ref 22–32)
Calcium: 8.7 mg/dL — ABNORMAL LOW (ref 8.9–10.3)
Chloride: 113 mmol/L — ABNORMAL HIGH (ref 98–111)
Creatinine, Ser: 1.11 mg/dL — ABNORMAL HIGH (ref 0.44–1.00)
GFR, Estimated: 52 mL/min — ABNORMAL LOW (ref >60)
Glucose, Bld: 216 mg/dL — ABNORMAL HIGH (ref 70–99)
Potassium: 4.4 mmol/L (ref 3.5–5.1)
Sodium: 138 mmol/L (ref 135–145)

## 2022-02-23 MED ORDER — VANCOMYCIN HCL 1250 MG/250ML IV SOLN
1250.0000 mg | INTRAVENOUS | Status: DC
  Administered 2022-02-23: 1250 mg via INTRAVENOUS
  Filled 2022-02-23 (×2): qty 250

## 2022-02-23 MED ORDER — BISACODYL 5 MG PO TBEC
5.0000 mg | DELAYED_RELEASE_TABLET | Freq: Once | ORAL | Status: AC
  Administered 2022-02-23: 5 mg via ORAL
  Filled 2022-02-23: qty 1

## 2022-02-23 NOTE — Progress Notes (Addendum)
PROGRESS NOTE    Heather Rogers  BXI:[id number] DOB: 07-19-1946 DOA: 02/18/2022 PCP: Charlott Rakes, MD   Brief Narrative: 76 year old with past medical history significant for metastatic left breast cancer (on active treatment with [name] and letrozole), with malignant left pleural effusion post Pleurx catheter placement on 07/07/2022, history of CVA, CKD stage IIIb, anemia of chronic disease, diabetes type 2, hypertension, hyperlipidemia presented with fevers and dyspnea.  She also had a syncope episode and reported increased output from her Pleurx catheter.  Patient was noted to have fever of 101.  CT head was negative.  CTA chest showed a large left pleural effusion with atelectasis and new left breast mass with lymphadenopathy.  CT spine, CT abdomen and pelvis was negative.  Patient was a started on IV vancomycin and cefepime.  Subsequently vancomycin was discontinued.  CCM was consulted and patient underwent instillation of fibrinolytics on 7/24.  Chest x-ray repeated 7/26 showed complete opacification of the left lung.  IR consulted for second  left side chest tube placement, at higher position, placed on 7/26.     Assessment & Plan:   Principal Problem:   Malignant left pleural effusion Active Problems:   Syncope   Acute respiratory failure with hypoxia (HCC)   Primary malignant neoplasm of breast with metastasis (HCC)   Chronic kidney disease, stage 3b (HCC)   Type 2 diabetes mellitus (A1c 6.4 on 2/5)    Hypertension associated with diabetes ([place])   History of CVA   Anemia of chronic disease   Hyperlipidemia associated with type 2 diabetes mellitus (HCC)   1-Malignant left pleural effusion Left side Complicated, loculated pleural effusion.  Acute hypoxic respiratory failure with hypoxia secondary to pleural effusion. #2 left breast cancer.  Patient had a Pleurx catheter as an outpatient since February 2023, started to have decreased output. -Presented with fevers.  CCM  consulted and underwent instillation of fibrinolytics on 7/24. -Continue with IV cefepime. -Pleural fluid cultures: Negative -Continue with oxygen supplementation 3 L -Chest x-ray; 7/26 complete opacification of the left side -Underwent second chest tube placement at higher position yielding 1.2 L hemorrhagic fluids on 7/26. -Chest x ray improved post second chest tube placement. Plan to proceed with CT chest to further evaluate.  Appreciate CCM management.  CT chest: Diminished volume of pleural fluid and on the peripheral and upper chest following placement of pigtail drainage catheter, a small locules of gas within the diminished left-sided pleural effusion, loculated fluid on the left heart border and volume loss in the left lower chest is similar.  Perhaps slightly improved aeration  in the upper chest. Follow CCM recommendations.  Culture from 7/23 pleural fluid; now growing Gram positive cocci in tetra. Will start Vancomycin.   2-AKI on CKD 3 b:  -Baseline  creatinine 1.2-1.49 -Worsening renal function 7/26, creatinine peaked to 1.9 --Renal ultrasound: Negative for hydronephrosis.  Increased cortical echogenicity suggests medical renal disease.  There are a few bilateral renal cysts. -Renal function improved today creatinine down to 1.3 -Improved with IV fluids. NSL fluids today    3-Primary malignant neoplasm of breast with metastasis Metastatic left breast cancer with malignant left pleural effusion and osseous involvement. Follow-up with Dr. Payton Mccallum On Letrozole and Versenio.  Holding due to active infection.   4-Diabetes type 2: Hemoglobin A1c 6.4 Continue to hold metformin while inpatient. Sliding scale insulin  Hypertension associated with diabetes: Continue with amlodipine, Coreg and hydralazine  Hyperlipidemia associated with type 2 diabetes: Continue with atorvastatin  Anemia of chronic  disease: Monitor hemoglobin History of CVA: Continue with aspirin and  statin  syncope: Likely vasovagal.  Orthostatic negative.  No significant arrhythmia on telemetry     Nutrition Problem: Increased nutrient needs Etiology: chronic illness (malignant pleural effusion)    Signs/Symptoms: estimated needs    Interventions: Ensure Enlive (each supplement provides 350kcal and 20 grams of protein), MVI, Liberalize Diet  Estimated body mass index is 20.56 kg/m as calculated from the following:   Height as of this encounter: '5\' 2"'$  (1.575 m).   Weight as of this encounter: 51 kg.   DVT prophylaxis: Heparin  Code Status: Full code Family Communication: care discussed with patient Disposition Plan:  Status is: Inpatient Remains inpatient appropriate because: management of loculated pleural effusion.     Consultants:  CCM  Procedures:  Chest tube placement 7/26  Antimicrobials:  Cefepime  Subjective: Overall she is breathing better. No BM in few days.    Objective: Vitals:   02/22/22 1712 02/22/22 1946 02/23/22 0500 02/23/22 0523  BP: (!) 149/63 134/63  (!) 149/63  Pulse:  84  90  Resp:  20  (!) 22  Temp:  99.6 F (37.6 C)  98.8 F (37.1 C)  TempSrc:  Oral  Oral  SpO2:  99%  100%  Weight:   51 kg   Height:        Intake/Output Summary (Last 24 hours) at 02/23/2022 0934 Last data filed at 02/23/2022 0841 Gross per 24 hour  Intake 2026.88 ml  Output 845 ml  Net 1181.88 ml    Filed Weights   02/21/22 0601 02/22/22 0500 02/23/22 0500  Weight: 47.6 kg 62.8 kg 51 kg    Examination:  General exam: NAD Respiratory system: BL air movement Cardiovascular system: S,1 S 2 RRR Gastrointestinal system: BS present, soft, nt Central nervous system: Alert Extremities: no edema   Data Reviewed: I have personally reviewed following labs and imaging studies  CBC: Recent Labs  Lab 02/18/22 1515 02/19/22 0500 02/20/22 0524 02/21/22 0538 02/22/22 0434 02/23/22 0835  WBC 7.8 6.4 6.6 6.8 5.6 4.7  NEUTROABS 6.0  --   --   --    --   --   HGB 9.5* 8.1* 8.1* 8.1* 7.9* 7.9*  HCT 29.3* 25.5* 25.8* 25.1* 23.9* 24.1*  MCV 80.3 81.0 82.4 80.7 79.4* 79.0*  PLT 306 246 245 249 271 161    Basic Metabolic Panel: Recent Labs  Lab 02/18/22 1515 02/19/22 0500 02/20/22 0524 02/21/22 0538 02/21/22 2001 02/22/22 0434  NA 137 137 138 137  --  136  K 4.0 4.0 4.4 4.8  --  4.5  CL 106 109 110 109  --  107  CO2 24 20* 22 21*  --  22  GLUCOSE 160* 105* 153* 169* 255* 173*  BUN 28* 21 28* 38*  --  32*  CREATININE 1.49* 1.41* 1.61* 1.95*  --  1.36*  CALCIUM 9.6 8.8* 9.1 9.1  --  8.5*  MG  --   --  2.4 2.7*  --   --     GFR: Estimated Creatinine Clearance: 27.8 mL/min (A) (by C-G formula based on SCr of 1.36 mg/dL (H)). Liver Function Tests: Recent Labs  Lab 02/18/22 1515  AST 18  ALT 15  ALKPHOS 50  BILITOT 0.3  PROT 7.5  ALBUMIN 3.6    No results for input(s): "LIPASE", "AMYLASE" in the last 168 hours. No results for input(s): "AMMONIA" in the last 168 hours. Coagulation Profile: No results for input(s): "  INR", "PROTIME" in the last 168 hours. Cardiac Enzymes: No results for input(s): "CKTOTAL", "CKMB", "CKMBINDEX", "TROPONINI" in the last 168 hours. BNP (last 3 results) No results for input(s): "PROBNP" in the last 8760 hours. HbA1C: No results for input(s): "HGBA1C" in the last 72 hours. CBG: Recent Labs  Lab 02/22/22 0809 02/22/22 1144 02/22/22 1641 02/22/22 2123 02/23/22 0735  GLUCAP 167* 168* 182* 174* 166*    Lipid Profile: No results for input(s): "CHOL", "HDL", "LDLCALC", "TRIG", "CHOLHDL", "LDLDIRECT" in the last 72 hours. Thyroid Function Tests: No results for input(s): "TSH", "T4TOTAL", "FREET4", "T3FREE", "THYROIDAB" in the last 72 hours. Anemia Panel: No results for input(s): "VITAMINB12", "FOLATE", "FERRITIN", "TIBC", "IRON", "RETICCTPCT" in the last 72 hours. Sepsis Labs: Recent Labs  Lab 02/18/22 1538 02/18/22 1625 02/19/22 1059 02/20/22 0524 02/21/22 0538  PROCALCITON  --    --  0.23 0.26 0.51  LATICACIDVEN 1.2 1.0  --   --   --      Recent Results (from the past 240 hour(s))  Blood culture (routine x 2)     Status: None   Collection Time: 02/18/22  3:14 PM   Specimen: BLOOD  Result Value Ref Range Status   Specimen Description   Final    BLOOD LEFT ANTECUBITAL Performed at [facility], [place] [address], [place], [place] [address]    Special Requests   Final    BOTTLES DRAWN AEROBIC AND ANAEROBIC Blood Culture results may not be optimal due to an inadequate volume of blood received in culture bottles Performed at [place] [address], [place], [place] [address]    Culture   Final    NO GROWTH 5 DAYS Performed at [facility], [place] [address], [place], [place] [address]    Report Status 02/23/2022 FINAL  Final  Blood culture (routine x 2)     Status: None   Collection Time: 02/18/22  3:30 PM   Specimen: BLOOD  Result Value Ref Range Status   Specimen Description   Final    BLOOD LEFT ANTECUBITAL Performed at [place] [address] [place], [place] [address]    Special Requests   Final    BLOOD Blood Culture adequate volume Performed at [place] [address] [place], [place] [address]    Culture   Final    NO GROWTH 5 DAYS Performed at [facility], [place] [address] [place], [place] [address]    Report Status 02/23/2022 FINAL  Final  SARS Coronavirus 2 by RT PCR (hospital order, performed in [facility] hospital lab) *cepheid single result test*     Status: None   Collection Time: 02/18/22  3:30 PM   Specimen: Nasal Swab  Result Value Ref Range Status   SARS Coronavirus 2 by RT PCR NEGATIVE NEGATIVE Final    Comment: (NOTE) SARS-CoV-2 target nucleic acids are NOT DETECTED.  The SARS-CoV-2 RNA is generally detectable in upper and lower respiratory specimens during the acute phase of infection. The lowest concentration of SARS-CoV-2  viral copies this assay can detect is 250 copies / mL. A negative result does not preclude SARS-CoV-2 infection and should not be used as the sole basis for treatment or other patient management decisions.  A negative result may occur with improper specimen collection / handling, submission of specimen other than nasopharyngeal swab, presence of viral mutation(s) within the areas targeted by this assay, and inadequate number of viral copies (<250 copies /  mL). A negative result must be combined with clinical observations, patient history, and epidemiological information.  Fact Sheet for Patients:   https://www.patel.info/  Fact Sheet for Healthcare Providers: https://hall.com/  This test is not yet approved or  cleared by the [place] FDA and has been authorized for detection and/or diagnosis of SARS-CoV-2 by FDA under an Emergency Use Authorization (EUA).  This EUA will remain in effect (meaning this test can be used) for the duration of the COVID-19 declaration under Section 564(b)(1) of the Act, 21 U.S.C. section 360bbb-3(b)(1), unless the authorization is terminated or revoked sooner.  Performed at [facility], [place] [address] [place], [place] [address]   Urine Culture     Status: None   Collection Time: 02/18/22  4:17 PM   Specimen: Urine, Clean Catch  Result Value Ref Range Status   Specimen Description   Final    URINE, CLEAN CATCH Performed at [facility], [place] [address], [place], [place] [address]    Special Requests   Final    NONE Performed at [facility], [place] [address], [place], [place] [address]    Culture   Final    NO GROWTH Performed at [facility], [place] [address] [place], [place] [address]    Report Status 02/19/2022 FINAL  Final  Culture, body fluid w Gram Stain-bottle     Status: None   Collection Time: 02/18/22  7:04 PM   Specimen: Fluid   Result Value Ref Range Status   Specimen Description FLUID PLEURAL  Final   Special Requests   Final    BOTTLES DRAWN AEROBIC AND ANAEROBIC Blood Culture adequate volume   Culture   Final    NO GROWTH 5 DAYS Performed at [facility], [place] [address] [place], [place] [address]    Report Status 02/23/2022 FINAL  Final  Gram stain     Status: None   Collection Time: 02/18/22  7:04 PM   Specimen: Fluid  Result Value Ref Range Status   Specimen Description FLUID PLEURAL  Final   Special Requests NONE  Final   Gram Stain   Final    FEW WBC PRESENT,BOTH PMN AND MONONUCLEAR NO ORGANISMS SEEN Performed at [facility], [address], [place], [place] [address]    Report Status 02/19/2022 FINAL  Final  MRSA Next Gen by PCR, Nasal     Status: None   Collection Time: 02/19/22 12:32 PM   Specimen: Nasal Mucosa; Nasal Swab  Result Value Ref Range Status   MRSA by PCR Next Gen NOT DETECTED NOT DETECTED Final    Comment: (NOTE) The GeneXpert MRSA Assay (FDA approved for NASAL specimens only), is one component of a comprehensive MRSA colonization surveillance program. It is not intended to diagnose MRSA infection nor to guide or monitor treatment for MRSA infections. Test performance is not FDA approved in patients less than 43 years old. Performed at [facility], [place] [address], [place], [place] [address]          Radiology Studies: CT CHEST WO CONTRAST  Result Date: 02/22/2022 CLINICAL DATA:  History of LEFT breast cancer with malignant pleural effusion. Also with diminished PleurX catheter output and loculated pleural fluid in the LEFT chest. * Tracking Code: BO * EXAM: CT CHEST WITHOUT CONTRAST TECHNIQUE: Multidetector CT imaging of the chest was performed following the standard protocol without IV contrast. RADIATION DOSE REDUCTION: This exam was performed according to  the departmental dose-optimization program which includes automated exposure  control, adjustment of the mA and/or kV according to patient size and/or use of iterative reconstruction technique. COMPARISON:  Prior study from February 18, 2022 FINDINGS: Cardiovascular: Calcified aortic atherosclerosis and signs of coronary artery disease. Heart size stable. Presumed malignant pleural fluid abuts the LEFT heart border with diminished volume since previous imaging in a generalized fashion throughout the chest. Suspect nodularity along the LEFT mediastinal border in an area that shows diminished pleural fluid since the recent comparison image (image 37/2) pleural thickening up to 7 mm. Central pulmonary vasculature is normal caliber. Limited assessment of cardiovascular structures given lack of intravenous contrast. Mediastinum/Nodes: Small lymph nodes throughout the mediastinum are similar to the very recent comparison imaging study. No thoracic inlet adenopathy. Irregular LEFT axillary lymph nodes largest 12 mm (image 51/2) these track into retropectoral nodal stations with irregular borders though with small size, a small lymph node along the margin of the LEFT pectoralis musculature measuring 7 mm (image 23/2 this serves as an example. Lungs/Pleura: Diminished pleural fluid in the LEFT chest still with medial loculation along the LEFT heart border measuring approximately 4.5 cm greatest thickness which is unchanged compared to the previous exam. The peripheral and superior aspects of loculated pleural fluid have decreased. There are small amounts of gas within the pleural space. The pigtail catheter which has been inserted into the LEFT pleural space in the interval resides in the posterior LEFT chest and shows marker for sideholes at the edge of the pleural space (image 54/2) no signs of consolidation in the RIGHT lung. Airways to the RIGHT chest are patent. There is airspace disease in the LEFT chest much of which is due to collapse with only slightly improved aeration since previous imaging  along the peripheral LEFT chest, still with some volume loss along the LEFT heart border. Upper Abdomen: Incidental imaging of upper abdominal contents without acute process. Musculoskeletal: Bony destruction of the sternum with mixed lytic and sclerotic features is similar to prior imaging. No acute bone process. LEFT breast mass unchanged in the short interval compatible with known breast cancer. Spinal degenerative changes. IMPRESSION: 1. Diminished volume of pleural fluid along the peripheral and upper chest following placement of a pigtail drainage catheter into a malignant effusion. Marker for sideholes at the edge of the pleural space, close attention on follow-up is suggested. 2. Small locules of gas within the diminished LEFT-sided pleural effusion, not unexpected following manipulation of PleurX catheter and placement of a LEFT pleural drain. 3. Loculated fluid along the LEFT heart border and volume loss in the LEFT lower chest is similar, perhaps slightly improved aeration in the upper chest compared to previous imaging. 4. Otherwise stable appearance of oncologic findings in the chest in the short interval. Aortic Atherosclerosis (ICD10-I70.0). These results will be called to the ordering clinician or representative by the Radiologist Assistant, and communication documented in the PACS or [employer]. Electronically Signed   By: Zetta Bills M.D.   On: 02/22/2022 14:10   DG CHEST PORT 1 VIEW  Result Date: 02/22/2022 CLINICAL DATA:  Left pleural effusion EXAM: PORTABLE CHEST 1 VIEW COMPARISON:  Previous studies including the examination of 02/21/2022 FINDINGS: Transverse diameter heart is increased. There is moderate left pleural effusion. Two left chest tubes are noted. Evaluation of left mid and left lower lung fields for infiltrates is limited by the effusion. There is no pneumothorax. Right lung is clear. IMPRESSION: No significant changes are noted in moderate left  pleural effusion.  Possible heterotopic phases/pneumonia in left mid and left lower lung fields. Right lung is clear. Electronically Signed   By: Elmer Picker M.D.   On: 02/22/2022 08:38   US RENAL  Result Date: 02/21/2022 CLINICAL DATA:  Renal dysfunction EXAM: RENAL / URINARY TRACT ULTRASOUND COMPLETE COMPARISON:  CT done on 02/18/2022 FINDINGS: Right Kidney: Renal measurements: 9.2 x 4.7 x 4.6 cm = volume: 102.7 mL. There is no hydronephrosis. There is increased echogenicity. There are few cysts in the left kidney in the upper pole and midportion each measuring less than 7 mm. Left Kidney: Renal measurements: 9.8 x 4.7 x 4.6 cm = volume: 109.7 mL. There is no hydronephrosis. There is increased echogenicity. There is 11 mm smoothly marginated hypoechoic lesion in the mid to lower portion of left kidney, possibly a cyst. Bladder: Appears normal for degree of bladder distention. Other: Technologist observed small left pleural effusion. IMPRESSION: There is no hydronephrosis. Increased cortical echogenicity suggests medical renal disease. There are few bilateral renal cysts. Electronically Signed   By: Elmer Picker M.D.   On: 02/21/2022 20:18   DG CHEST PORT 1 VIEW  Result Date: 02/21/2022 CLINICAL DATA:  Pleural effusion EXAM: PORTABLE CHEST 1 VIEW COMPARISON:  02/21/2022, 02/20/2022 FINDINGS: PleurX catheter at left base. Interim placement of pigtail drainage catheter over the left mid chest. Decreased left pleural effusion with small moderate residual. Airspace disease at left base. Right lung grossly clear. Borderline cardiomegaly with aortic atherosclerosis. Possible small amount of air within the left lower lateral pleural space. Aortic atherosclerosis. Electronic device over the left chest. IMPRESSION: Interim placement second drainage catheter on the left with decreased left pleural effusion. Small moderate residual left pleural effusion and airspace disease at the left base. Electronically Signed   By:  Donavan Foil M.D.   On: 02/21/2022 17:59   CT IMAGE GUIDED DRAINAGE BY PERCUTANEOUS CATHETER  Result Date: 02/21/2022 INDICATION: 76 year old woman with history of metastatic breast cancer and loculated left pleural effusion presents to IR for chest tube placement. Patient has a left PleurX catheter, however drainage has been minimal. EXAM: CT-guided left chest tube placement MEDICATIONS: Fentanyl IV 50 mcg ANESTHESIA/SEDATION: None COMPLICATIONS: None immediate. TECHNIQUE: Informed written consent was obtained from the patient after a thorough discussion of the procedural risks, benefits and alternatives. All questions were addressed. Maximal Sterile Barrier Technique was utilized including caps, mask, sterile gowns, sterile gloves, sterile drape, hand hygiene and skin antiseptic. A timeout was performed prior to the initiation of the procedure. PROCEDURE: The left posterior chest wall skin was prepped with chlorhexidine in a sterile fashion, and a sterile drape was applied covering the operative field. A sterile gown and sterile gloves were used for the procedure. Local anesthesia was provided with 1% Lidocaine. Following local lidocaine administration, the left pleural fluid was accessed with a 19 gauge Yueh needle utilizing CT guidance. The catheter was removed over 0.035 inch guidewire. Tract dilation was performed with 10 [place] dilator. 12.0 [place] dilator could not be advanced over the guidewire, likely due to narrow inter costal spaces. 12.0 [place] multipurpose pigtail drain would not track either. 10.2 [place] multipurpose pigtail drain was successfully inserted into the left pleural space. Final position of the pigtail was confirmed with CT. Drain attached to Pleur-Evac set at -20 cm H2O. Drain secured to skin with suture. FINDINGS: Large left pleural effusion IMPRESSION: 10.2 [place] multipurpose pigtail drain inserted into left pleural effusion. Unable to track larger drain into left pleural space,  likely  due to narrow intercostal spaces. Electronically Signed   By: Miachel Roux M.D.   On: 02/21/2022 15:37        Scheduled Meds:  amLODipine  10 mg Oral Daily   aspirin EC  81 mg Oral Daily   atorvastatin  80 mg Oral QPM   bisacodyl  5 mg Oral Once   carvedilol  25 mg Oral BID WC   feeding supplement  237 mL Oral BID BM   gabapentin  100 mg Oral TID   heparin  5,000 Units Subcutaneous Q8H   hydrALAZINE  50 mg Oral TID   insulin aspart  0-9 Units Subcutaneous TID WC   multivitamin with minerals  1 tablet Oral Daily   polyethylene glycol  17 g Oral Daily   sodium chloride flush  10 mL Intrapleural Q8H   sodium chloride flush  3 mL Intravenous Q12H   Continuous Infusions:  sodium chloride 50 mL/hr at 02/23/22 0440   ceFEPime (MAXIPIME) IV Stopped (02/22/22 1712)     LOS: 5 days    Time spent: 35 minutes    Mikle Sternberg A Cortland Crehan, MD [facility]   If 7PM-7AM, please contact night-coverage [email or web address]  02/23/2022, 9:34 AM

## 2022-02-23 NOTE — Progress Notes (Signed)
Date and time results received: 02/23/22 1400 (use smartphrase ".now" to insert current time)  Test: aerobic pleural fluid culture Critical Value: positive for gram positive cocci and tetrads.  Name of Provider Notified: Tyrell Antonio, MD  Orders Received? Or Actions Taken?:   Awaiting new orders.

## 2022-02-23 NOTE — Progress Notes (Signed)
NAME:  Heather Rogers, MRN:  [id number], DOB:  September 09, 1945, LOS: 5 ADMISSION DATE:  02/18/2022, CONSULTATION DATE:  7/24 REFERRING MD:  Dr. Manuella Ghazi, CHIEF COMPLAINT:  SOB   BRIEF  76 year old female with past medical history as below, which is significant for CKD 3, diabetes, hypertension, metastatic breast cancer for which she is currently undergoing treatment with [name] and letrozole.  She has Pleurx catheter on the left which is followed by Dr. Verlee Monte in the pulmonary clinic.  She was last seen in April and at that time was draining the Pleurx every other day.  Output decreased and she was able to drain about every 4 days.  Over the course of a couple of weeks her output has significantly increased to the point where her daughter was draining every 2 to 3 days with outputs around 2-300.  On 7/23 the patient developed fever and suffered a syncopal episode for which she presented to [facility] emergency department.  Upon arrival to the emergency department she was hypoxic requiring 2 L of supplemental oxygen and was febrile to 101F.  Work-up in the emergency department included CT angiogram of the chest demonstrating large pleural effusion on the left with areas concerning for loculation. Pleurx vacuum bottle attached in ED and only drained a small amount of fluid. PCCM consulted for Pleurx and effusion management.    Pertinent  Medical History   has a past medical history of Chronic kidney disease, stage 3b ([place]), Diabetes ([place]), Hypertension, Pneumonia due to COVID-19 virus (07/25/2020), Primary malignant neoplasm of breast with metastasis ([place]) (09/14/2021), Stroke (cerebrum) ([place]), and Stroke ([place]) (05/15/2018).   Significant Hospital Events: Including procedures, antibiotic start and stop dates in addition to other pertinent events   02/18/2022  - wrosening left effusionloculation v , concern for obstruction of baseline left pleurx -> s/p ip fibrinolytucs -f rist dose 7/23 fluid culture 7/23  - covid neg 7/23 MRSA neg 7/23 Blood c/s 7/25 - 3L -  97%. New o2 need.  Per daughter - till Sunday 02/18/22 pleurx was draining well. Same day fever too. Then "passed out" came to ER -> found to have worsening effusion and loculated. Now has reponded to first dose intrapleural fibrinolytic but CXR looks unchnaged. Daughter feels she is a bit more tired today. FEbrile 101/.97F yesteday  2nd DOSE IP LYITC ABG without hypercapnia - patient was sleepy 7/26- still on 3L [place]. No drainage after 2nd dose IP Lytic per RN - only 25cc ovrnight.  Pr daughter [name] - > patient did not turn per protocol and then went to sleep. But daugther also feels -> patient did drain well after 2nd dose. However,  Pleurx -> at bottom.  Still febrile. Eating BF. Oriented and stable per RN. WBC normal 2nd 10 F ches ttube at higher levvel by IR - > drained 1.2L hemorrhagic fluid straight away LHD 186, gluc 137.  Cutlure/grm stain Cytology -   Interim History / Subjective:   Chest tube output is come down from the new pigtail catheter.  No output from the Pleurx  Objective   Blood pressure 130/71, pulse 86, temperature 99.4 F (37.4 C), temperature source Oral, resp. rate 16, height '5\' 2"'$  (1.575 m), weight 51 kg, SpO2 96 %.        Intake/Output Summary (Last 24 hours) at 02/23/2022 1349 Last data filed at 02/23/2022 0841 Gross per 24 hour  Intake 2026.88 ml  Output 845 ml  Net 1181.88 ml   Filed Weights   02/21/22  0601 02/22/22 0500 02/23/22 0500  Weight: 47.6 kg 62.8 kg 51 kg    Examination: Gen: Pleasant, up to chair, well-nourished, in no distress,  normal affect  ENT: No lesions,  mouth clear,  oropharynx clear, no postnasal drip  Neck: No JVD, no stridor  Lungs: Coarse bilaterally.  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  Pleurx placed in February 2023 - Left loculated effusion  of above - Present on Admit.  Still no significant output after tPA dwell in the Pleurx.  At this point I believe the new pigtail has served its purpose, no longer draining and the associated pocket appears to be improved.  The Pleurx has never started to redrain.  I think would be reasonable to consider pulling both tubes.  If she needs another tube placed then would consider either a pigtail or possibly a new Pleurx into the remaining inferior basilar pocket of fluid.    Best Practice (right click and "Reselect all SmartList Selections" daily)   Daughter update at bedside on 7/28  Anti-infectives (From admission, onward)    Start     Dose/Rate Route Frequency Ordered Stop   02/20/22 1600  vancomycin (VANCOCIN) IVPB 1000 mg/200 mL premix  Status:  Discontinued        1,000 mg 200 mL/hr over 60 Minutes Intravenous Every 48 hours 02/18/22 2008 02/20/22 1153   02/19/22 1600  ceFEPIme (MAXIPIME) 2 g in sodium chloride 0.9 % 100 mL IVPB        2 g 200 mL/hr over 30 Minutes Intravenous Every 24 hours 02/18/22 2004     02/18/22 1530  ceFEPIme (MAXIPIME) 2 g in sodium chloride 0.9 % 100 mL IVPB        2 g 200 mL/hr over 30 Minutes Intravenous  Once 02/18/22 1517 02/18/22 1608   02/18/22 1530  vancomycin (VANCOCIN) IVPB 1000 mg/200 mL premix        1,000 mg 200 mL/hr over 60 Minutes Intravenous  Once 02/18/22 1526 02/18/22 1715        SIGNATURE   Baltazar Apo, MD, PhD 02/23/2022, 1:49 PM [facility] [phone] or if no answer before 7:00PM call [phone] For any issues after 7:00PM please call eLink [phone]    LABS    PULMONARY Recent Labs  Lab 02/20/22 1155  PHART 7.39  PCO2ART 38   PO2ART 74*  HCO3 23.0  O2SAT 95.7    CBC Recent Labs  Lab 02/21/22 0538 02/22/22 0434 02/23/22 0835  HGB 8.1* 7.9* 7.9*  HCT 25.1* 23.9* 24.1*  WBC 6.8 5.6 4.7  PLT 249 271 296    COAGULATION No results for input(s): "INR" in the last 168 hours.  CARDIAC  No results for input(s): "TROPONINI" in the last 168 hours. No results for input(s): "PROBNP" in the last 168 hours.   CHEMISTRY Recent Labs  Lab 02/19/22 0500 02/20/22 0524 02/21/22 0538 02/21/22 2001 02/22/22 0434 02/23/22 0835  NA 137 138 137  --  136 138  K 4.0 4.4 4.8  --  4.5 4.4  CL 109 110 109  --  107 113*  CO2 20* 22 21*  --  22 19*  GLUCOSE 105* 153* 169* 255* 173* 216*  BUN 21 28* 38*  --  32* 24*  CREATININE 1.41* 1.61* 1.95*  --  1.36* 1.11*  CALCIUM 8.8* 9.1 9.1  --  8.5* 8.7*  MG  --  2.4 2.7*  --   --   --    Estimated Creatinine Clearance: 34.1 mL/min (A) (by C-G formula based on SCr of 1.11 mg/dL (H)).   LIVER Recent Labs  Lab 02/18/22 1515  AST 18  ALT 15  ALKPHOS 50  BILITOT 0.3  PROT 7.5  ALBUMIN 3.6     INFECTIOUS Recent Labs  Lab 02/18/22 1538 02/18/22 1625 02/19/22 1059 02/20/22 0524 02/21/22 0538  LATICACIDVEN 1.2 1.0  --   --   --   PROCALCITON  --   --  0.23 0.26 0.51     ENDOCRINE CBG (last 3)  Recent Labs    02/22/22 2123 02/23/22 0735 02/23/22 1117  GLUCAP 174* 166* 194*         IMAGING x48h  - image(s) personally visualized  -   highlighted in bold CT CHEST WO CONTRAST  Result Date: 02/22/2022 CLINICAL DATA:  History of LEFT breast cancer with malignant pleural effusion. Also with diminished PleurX catheter output and loculated pleural fluid in the LEFT chest. * Tracking Code: BO * EXAM: CT CHEST WITHOUT CONTRAST TECHNIQUE: Multidetector CT imaging of the chest was performed following the standard protocol without IV contrast. RADIATION DOSE REDUCTION: This exam was performed according to the departmental dose-optimization program which  includes automated exposure control, adjustment of the mA and/or kV according to patient size and/or use of iterative reconstruction technique. COMPARISON:  Prior study from February 18, 2022 FINDINGS: Cardiovascular: Calcified aortic atherosclerosis and signs of coronary artery disease. Heart size stable. Presumed malignant pleural fluid abuts the LEFT heart border with diminished volume since previous imaging in a generalized fashion throughout the chest. Suspect nodularity along the LEFT mediastinal border in an area that shows diminished pleural fluid since the recent comparison image (image 37/2) pleural thickening up to 7 mm. Central pulmonary vasculature is normal caliber. Limited assessment of cardiovascular structures given lack of intravenous contrast. Mediastinum/Nodes: Small lymph nodes throughout the mediastinum are similar to the very recent comparison imaging study. No thoracic inlet adenopathy. Irregular LEFT axillary lymph nodes largest 12 mm (image 51/2) these track into retropectoral nodal stations with irregular borders though with small size, a small lymph node along the margin of the LEFT pectoralis musculature measuring 7 mm (image 23/2 this serves as an example. Lungs/Pleura: Diminished pleural fluid in the LEFT chest still with medial loculation along the LEFT heart border measuring approximately 4.5 cm greatest thickness which is unchanged compared to the previous exam. The peripheral and superior aspects of loculated pleural fluid have decreased. There are small amounts of gas within the pleural space. The pigtail catheter which has been inserted into the LEFT pleural space in the interval resides in the posterior LEFT chest and shows marker for sideholes at the edge of the pleural space (image 54/2) no signs of consolidation in the RIGHT lung. Airways to the RIGHT chest are patent. There is airspace disease in the LEFT chest much of which is due to collapse with only slightly improved  aeration since previous imaging along the peripheral LEFT chest, still with some volume loss along the LEFT heart border. Upper Abdomen: Incidental imaging of upper abdominal contents without acute process. Musculoskeletal: Bony  destruction of the sternum with mixed lytic and sclerotic features is similar to prior imaging. No acute bone process. LEFT breast mass unchanged in the short interval compatible with known breast cancer. Spinal degenerative changes. IMPRESSION: 1. Diminished volume of pleural fluid along the peripheral and upper chest following placement of a pigtail drainage catheter into a malignant effusion. Marker for sideholes at the edge of the pleural space, close attention on follow-up is suggested. 2. Small locules of gas within the diminished LEFT-sided pleural effusion, not unexpected following manipulation of PleurX catheter and placement of a LEFT pleural drain. 3. Loculated fluid along the LEFT heart border and volume loss in the LEFT lower chest is similar, perhaps slightly improved aeration in the upper chest compared to previous imaging. 4. Otherwise stable appearance of oncologic findings in the chest in the short interval. Aortic Atherosclerosis (ICD10-I70.0). These results will be called to the ordering clinician or representative by the Radiologist Assistant, and communication documented in the PACS or [employer]. Electronically Signed   By: Zetta Bills M.D.   On: 02/22/2022 14:10   DG CHEST PORT 1 VIEW  Result Date: 02/22/2022 CLINICAL DATA:  Left pleural effusion EXAM: PORTABLE CHEST 1 VIEW COMPARISON:  Previous studies including the examination of 02/21/2022 FINDINGS: Transverse diameter heart is increased. There is moderate left pleural effusion. Two left chest tubes are noted. Evaluation of left mid and left lower lung fields for infiltrates is limited by the effusion. There is no pneumothorax. Right lung is clear. IMPRESSION: No significant changes are noted in  moderate left pleural effusion. Possible heterotopic phases/pneumonia in left mid and left lower lung fields. Right lung is clear. Electronically Signed   By: Elmer Picker M.D.   On: 02/22/2022 08:38   US RENAL  Result Date: 02/21/2022 CLINICAL DATA:  Renal dysfunction EXAM: RENAL / URINARY TRACT ULTRASOUND COMPLETE COMPARISON:  CT done on 02/18/2022 FINDINGS: Right Kidney: Renal measurements: 9.2 x 4.7 x 4.6 cm = volume: 102.7 mL. There is no hydronephrosis. There is increased echogenicity. There are few cysts in the left kidney in the upper pole and midportion each measuring less than 7 mm. Left Kidney: Renal measurements: 9.8 x 4.7 x 4.6 cm = volume: 109.7 mL. There is no hydronephrosis. There is increased echogenicity. There is 11 mm smoothly marginated hypoechoic lesion in the mid to lower portion of left kidney, possibly a cyst. Bladder: Appears normal for degree of bladder distention. Other: Technologist observed small left pleural effusion. IMPRESSION: There is no hydronephrosis. Increased cortical echogenicity suggests medical renal disease. There are few bilateral renal cysts. Electronically Signed   By: Elmer Picker M.D.   On: 02/21/2022 20:18   DG CHEST PORT 1 VIEW  Result Date: 02/21/2022 CLINICAL DATA:  Pleural effusion EXAM: PORTABLE CHEST 1 VIEW COMPARISON:  02/21/2022, 02/20/2022 FINDINGS: PleurX catheter at left base. Interim placement of pigtail drainage catheter over the left mid chest. Decreased left pleural effusion with small moderate residual. Airspace disease at left base. Right lung grossly clear. Borderline cardiomegaly with aortic atherosclerosis. Possible small amount of air within the left lower lateral pleural space. Aortic atherosclerosis. Electronic device over the left chest. IMPRESSION: Interim placement second drainage catheter on the left with decreased left pleural effusion. Small moderate residual left pleural effusion and airspace disease at the left  base. Electronically Signed   By: Donavan Foil M.D.   On: 02/21/2022 17:59   CT IMAGE GUIDED DRAINAGE BY PERCUTANEOUS CATHETER  Result Date: 02/21/2022 INDICATION: 76 year old woman with  history of metastatic breast cancer and loculated left pleural effusion presents to IR for chest tube placement. Patient has a left PleurX catheter, however drainage has been minimal. EXAM: CT-guided left chest tube placement MEDICATIONS: Fentanyl IV 50 mcg ANESTHESIA/SEDATION: None COMPLICATIONS: None immediate. TECHNIQUE: Informed written consent was obtained from the patient after a thorough discussion of the procedural risks, benefits and alternatives. All questions were addressed. Maximal Sterile Barrier Technique was utilized including caps, mask, sterile gowns, sterile gloves, sterile drape, hand hygiene and skin antiseptic. A timeout was performed prior to the initiation of the procedure. PROCEDURE: The left posterior chest wall skin was prepped with chlorhexidine in a sterile fashion, and a sterile drape was applied covering the operative field. A sterile gown and sterile gloves were used for the procedure. Local anesthesia was provided with 1% Lidocaine. Following local lidocaine administration, the left pleural fluid was accessed with a 19 gauge Yueh needle utilizing CT guidance. The catheter was removed over 0.035 inch guidewire. Tract dilation was performed with 10 French dilator. 12.0 [place] dilator could not be advanced over the guidewire, likely due to narrow inter costal spaces. 12.0 [place] multipurpose pigtail drain would not track either. 10.2 [place] multipurpose pigtail drain was successfully inserted into the left pleural space. Final position of the pigtail was confirmed with CT. Drain attached to Pleur-Evac set at -20 cm H2O. Drain secured to skin with suture. FINDINGS: Large left pleural effusion IMPRESSION: 10.2 French multipurpose pigtail drain inserted into left pleural effusion. Unable to track  larger drain into left pleural space, likely due to narrow intercostal spaces. Electronically Signed   By: Miachel Roux M.D.   On: 02/21/2022 15:37

## 2022-02-23 NOTE — Plan of Care (Signed)
  Problem: Coping: Goal: Ability to adjust to condition or change in health will improve Outcome: Progressing   Problem: Fluid Volume: Goal: Ability to maintain a balanced intake and output will improve Outcome: Progressing   Problem: Health Behavior/Discharge Planning: Goal: Ability to identify and utilize available resources and services will improve Outcome: Progressing Goal: Ability to manage health-related needs will improve Outcome: Progressing   Problem: Skin Integrity: Goal: Risk for impaired skin integrity will decrease Outcome: Progressing   Problem: Tissue Perfusion: Goal: Adequacy of tissue perfusion will improve Outcome: Progressing

## 2022-02-23 NOTE — Progress Notes (Signed)
Pharmacy Antibiotic Note  Heather Rogers is a 76 y.o. female admitted on 02/18/2022 with pneumonia.  Pharmacy has been consulted for Vanco dosing.  ID: worsening malignant L pleural effusion +/- PNA - Pleurx placed 09/07/21, Tpa/dornase 7/24, 7/25  - Tmax 99.6, WBC 4.7, Scr 1.1  Antimicrobials this admission: 7/23 cefepime >>  7/23 vanc >> 7/25, 7/28>>  Microbiology results: 7/23 BCx: ngtd 7/23 UCx: NGF 7/23 Pleural fluid: GPC in tetrads 7/24 MRSA PCR: neg  Plan: Resume Vancomycin 1250 mg IV Q 48 hrs.  Goal AUC 400-550.  Expected AUC: 520, SCr used: 1.11     Height: '5\' 2"'$  (157.5 cm) Weight: 51 kg (112 lb 6.4 oz) IBW/kg (Calculated) : 50.1  Temp (24hrs), Avg:99.3 F (37.4 C), Min:98.8 F (37.1 C), Max:99.6 F (37.6 C)  Recent Labs  Lab 02/18/22 1538 02/18/22 1625 02/19/22 0500 02/20/22 0524 02/21/22 0538 02/22/22 0434 02/23/22 0835  WBC  --   --  6.4 6.6 6.8 5.6 4.7  CREATININE  --   --  1.41* 1.61* 1.95* 1.36* 1.11*  LATICACIDVEN 1.2 1.0  --   --   --   --   --     Estimated Creatinine Clearance: 34.1 mL/min (A) (by C-G formula based on SCr of 1.11 mg/dL (H)).    Allergies  Allergen Reactions   Sulfa Antibiotics     Itching        Waldemar Siegel S. Alford Highland, PharmD, BCPS Clinical Staff Pharmacist [email or web address]  Wayland Salinas 02/23/2022 3:12 PM

## 2022-02-23 NOTE — Progress Notes (Addendum)
Referring Physician(s): Baltazar Apo   Supervising Physician: Aletta Edouard  Patient Status:  [facility] - In-pt  Chief Complaint:  Malfunctioning left PleurX S/p left chest tube placement with Dr. Dwaine Gale on 7/26   Subjective:  Patient laying in bed, NAD.  Denies any chest pain or shortness of breath  Allergies: Sulfa antibiotics  Medications: Prior to Admission medications   Medication Sig Start Date End Date Taking? Authorizing Provider  abemaciclib ([name]) 100 MG tablet Take 1 tablet (100 mg total) by mouth 2 (two) times daily. Swallow tablets whole. Do not chew, crush, or split tablets before swallowing. 02/15/22  Yes Nicholas Lose, MD  allopurinol (ZYLOPRIM) 100 MG tablet Take 1 tablet (100 mg total) by mouth daily. 09/27/21  Yes Charlott Rakes, MD  amLODipine (NORVASC) 10 MG tablet Take 10 mg by mouth daily.   Yes [provider]  aspirin EC 81 MG tablet Take 81 mg by mouth daily. Swallow whole.   Yes [provider]  atorvastatin (LIPITOR) 80 MG tablet TAKE ONE TABLET BY MOUTH EVERY EVENING TO LOWER CHOLESTEROL Patient taking differently: Take 80 mg by mouth every evening. 09/27/21  Yes Charlott Rakes, MD  carvedilol (COREG) 25 MG tablet Take 1 tablet (25 mg total) by mouth 2 (two) times daily with a meal. 09/27/21  Yes Newlin, Enobong, MD  cholecalciferol (VITAMIN D3) 25 MCG (1000 UNIT) tablet Take 1,000 Units by mouth daily.   Yes [provider]  ferrous sulfate 325 (65 FE) MG EC tablet Take 1 tablet (325 mg total) by mouth in the morning and at bedtime. 05/22/21  Yes Mayers, Cari S, PA-C  gabapentin (NEURONTIN) 100 MG capsule Take 1 capsule (100 mg total) by mouth 3 (three) times daily. 09/27/21  Yes Charlott Rakes, MD  hydrALAZINE (APRESOLINE) 50 MG tablet Take 1 tablet (50 mg total) by mouth 3 (three) times daily. 01/08/22  Yes Charlott Rakes, MD  letrozole (FEMARA) 2.5 MG tablet Take 1 tablet (2.5 mg total) by mouth daily. 12/15/21  Yes Nicholas Lose, MD  metFORMIN (GLUCOPHAGE) 500 MG tablet Take 1 tablet (500 mg total) by mouth daily with breakfast. TAKE ONE TABLET BY MOUTH TWICE A DAY WITH A MEAL Patient taking differently: Take 500 mg by mouth daily with breakfast. 01/08/22  Yes Charlott Rakes, MD  Multiple Vitamin (MULTIVITAMIN WITH MINERALS) TABS tablet Take 1 tablet by mouth daily. 09/09/21  Yes Ghimire, Henreitta Leber, MD  ondansetron (ZOFRAN) 8 MG tablet Take 1 tablet (8 mg total) by mouth every 8 (eight) hours as needed for nausea or vomiting. 09/28/21  Yes Nicholas Lose, MD  polyethylene glycol (MIRALAX / GLYCOLAX) 17 g packet Take 17 g by mouth daily.   Yes Nicholas Lose, MD  Accu-Chek FastClix Lancets MISC Check blood sugar fasting and before meals and again if pt feels bad (symptoms of hypo). 09/20/21   Charlott Rakes, MD  Blood Glucose Monitoring Suppl (ACCU-CHEK GUIDE) w/Device KIT 1 each by Does not apply route as directed. 08/25/18   Fulp, Cammie, MD  glucose blood (ACCU-CHEK GUIDE) test strip USE TO CHECK FASTING BLOOD SUGAR AND BEFORE MEALS AND AGAIN IF PATIENT FEELS BAD; SYMPTOMS OF HYPOGLYCEMIA 09/20/21   Charlott Rakes, MD  prochlorperazine (COMPAZINE) 10 MG tablet Take 1 tablet (10 mg total) by mouth every 6 (six) hours as needed for nausea or vomiting. Patient not taking: Reported on 02/15/2022 09/28/21   Nicholas Lose, MD     Vital Signs: BP 130/71 (BP Location: Left Arm)   Pulse  86   Temp 99.4 F (37.4 C) (Oral)   Resp 16   Ht 5' 2" (1.575 m)   Wt 112 lb 6.4 oz (51 kg)   SpO2 96%   BMI 20.56 kg/m   Physical Exam Vitals reviewed.  Constitutional:      General: She is not in acute distress.    Appearance: Normal appearance. She is not ill-appearing.  HENT:     Head: Normocephalic.  Pulmonary:     Effort: Pulmonary effort is normal.  Skin:    General: Skin is warm and dry.     Comments: Positive left chest tube to suction. Site is unremarkable with no erythema, edema, tenderness, bleeding or drainage. Suture  and stat lock in place. Dressing is clean, dry, and intact. Hazy yellow fluid noted in the Pleru Evac.   Positive left PleurX to suction. Red colored fluid in the Pleru Evac.   Neurological:     Mental Status: She is alert. Mental status is at baseline.  Psychiatric:        Mood and Affect: Mood normal.        Behavior: Behavior normal.     Imaging: CT CHEST WO CONTRAST  Result Date: 02/22/2022 CLINICAL DATA:  History of LEFT breast cancer with malignant pleural effusion. Also with diminished PleurX catheter output and loculated pleural fluid in the LEFT chest. * Tracking Code: BO * EXAM: CT CHEST WITHOUT CONTRAST TECHNIQUE: Multidetector CT imaging of the chest was performed following the standard protocol without IV contrast. RADIATION DOSE REDUCTION: This exam was performed according to the departmental dose-optimization program which includes automated exposure control, adjustment of the mA and/or kV according to patient size and/or use of iterative reconstruction technique. COMPARISON:  Prior study from February 18, 2022 FINDINGS: Cardiovascular: Calcified aortic atherosclerosis and signs of coronary artery disease. Heart size stable. Presumed malignant pleural fluid abuts the LEFT heart border with diminished volume since previous imaging in a generalized fashion throughout the chest. Suspect nodularity along the LEFT mediastinal border in an area that shows diminished pleural fluid since the recent comparison image (image 37/2) pleural thickening up to 7 mm. Central pulmonary vasculature is normal caliber. Limited assessment of cardiovascular structures given lack of intravenous contrast. Mediastinum/Nodes: Small lymph nodes throughout the mediastinum are similar to the very recent comparison imaging study. No thoracic inlet adenopathy. Irregular LEFT axillary lymph nodes largest 12 mm (image 51/2) these track into retropectoral nodal stations with irregular borders though with small size, a small  lymph node along the margin of the LEFT pectoralis musculature measuring 7 mm (image 23/2 this serves as an example. Lungs/Pleura: Diminished pleural fluid in the LEFT chest still with medial loculation along the LEFT heart border measuring approximately 4.5 cm greatest thickness which is unchanged compared to the previous exam. The peripheral and superior aspects of loculated pleural fluid have decreased. There are small amounts of gas within the pleural space. The pigtail catheter which has been inserted into the LEFT pleural space in the interval resides in the posterior LEFT chest and shows marker for sideholes at the edge of the pleural space (image 54/2) no signs of consolidation in the RIGHT lung. Airways to the RIGHT chest are patent. There is airspace disease in the LEFT chest much of which is due to collapse with only slightly improved aeration since previous imaging along the peripheral LEFT chest, still with some volume loss along the LEFT heart border. Upper Abdomen: Incidental imaging of upper abdominal contents without acute process.  Musculoskeletal: Bony destruction of the sternum with mixed lytic and sclerotic features is similar to prior imaging. No acute bone process. LEFT breast mass unchanged in the short interval compatible with known breast cancer. Spinal degenerative changes. IMPRESSION: 1. Diminished volume of pleural fluid along the peripheral and upper chest following placement of a pigtail drainage catheter into a malignant effusion. Marker for sideholes at the edge of the pleural space, close attention on follow-up is suggested. 2. Small locules of gas within the diminished LEFT-sided pleural effusion, not unexpected following manipulation of PleurX catheter and placement of a LEFT pleural drain. 3. Loculated fluid along the LEFT heart border and volume loss in the LEFT lower chest is similar, perhaps slightly improved aeration in the upper chest compared to previous imaging. 4.  Otherwise stable appearance of oncologic findings in the chest in the short interval. Aortic Atherosclerosis (ICD10-I70.0). These results will be called to the ordering clinician or representative by the Radiologist Assistant, and communication documented in the PACS or [employer]. Electronically Signed   By: Zetta Bills M.D.   On: 02/22/2022 14:10   DG CHEST PORT 1 VIEW  Result Date: 02/22/2022 CLINICAL DATA:  Left pleural effusion EXAM: PORTABLE CHEST 1 VIEW COMPARISON:  Previous studies including the examination of 02/21/2022 FINDINGS: Transverse diameter heart is increased. There is moderate left pleural effusion. Two left chest tubes are noted. Evaluation of left mid and left lower lung fields for infiltrates is limited by the effusion. There is no pneumothorax. Right lung is clear. IMPRESSION: No significant changes are noted in moderate left pleural effusion. Possible heterotopic phases/pneumonia in left mid and left lower lung fields. Right lung is clear. Electronically Signed   By: Elmer Picker M.D.   On: 02/22/2022 08:38   US RENAL  Result Date: 02/21/2022 CLINICAL DATA:  Renal dysfunction EXAM: RENAL / URINARY TRACT ULTRASOUND COMPLETE COMPARISON:  CT done on 02/18/2022 FINDINGS: Right Kidney: Renal measurements: 9.2 x 4.7 x 4.6 cm = volume: 102.7 mL. There is no hydronephrosis. There is increased echogenicity. There are few cysts in the left kidney in the upper pole and midportion each measuring less than 7 mm. Left Kidney: Renal measurements: 9.8 x 4.7 x 4.6 cm = volume: 109.7 mL. There is no hydronephrosis. There is increased echogenicity. There is 11 mm smoothly marginated hypoechoic lesion in the mid to lower portion of left kidney, possibly a cyst. Bladder: Appears normal for degree of bladder distention. Other: Technologist observed small left pleural effusion. IMPRESSION: There is no hydronephrosis. Increased cortical echogenicity suggests medical renal disease. There are  few bilateral renal cysts. Electronically Signed   By: Elmer Picker M.D.   On: 02/21/2022 20:18   DG CHEST PORT 1 VIEW  Result Date: 02/21/2022 CLINICAL DATA:  Pleural effusion EXAM: PORTABLE CHEST 1 VIEW COMPARISON:  02/21/2022, 02/20/2022 FINDINGS: PleurX catheter at left base. Interim placement of pigtail drainage catheter over the left mid chest. Decreased left pleural effusion with small moderate residual. Airspace disease at left base. Right lung grossly clear. Borderline cardiomegaly with aortic atherosclerosis. Possible small amount of air within the left lower lateral pleural space. Aortic atherosclerosis. Electronic device over the left chest. IMPRESSION: Interim placement second drainage catheter on the left with decreased left pleural effusion. Small moderate residual left pleural effusion and airspace disease at the left base. Electronically Signed   By: Donavan Foil M.D.   On: 02/21/2022 17:59   CT IMAGE GUIDED DRAINAGE BY PERCUTANEOUS CATHETER  Result Date: 02/21/2022 INDICATION: 76 year old  woman with history of metastatic breast cancer and loculated left pleural effusion presents to IR for chest tube placement. Patient has a left PleurX catheter, however drainage has been minimal. EXAM: CT-guided left chest tube placement MEDICATIONS: Fentanyl IV 50 mcg ANESTHESIA/SEDATION: None COMPLICATIONS: None immediate. TECHNIQUE: Informed written consent was obtained from the patient after a thorough discussion of the procedural risks, benefits and alternatives. All questions were addressed. Maximal Sterile Barrier Technique was utilized including caps, mask, sterile gowns, sterile gloves, sterile drape, hand hygiene and skin antiseptic. A timeout was performed prior to the initiation of the procedure. PROCEDURE: The left posterior chest wall skin was prepped with chlorhexidine in a sterile fashion, and a sterile drape was applied covering the operative field. A sterile gown and sterile gloves  were used for the procedure. Local anesthesia was provided with 1% Lidocaine. Following local lidocaine administration, the left pleural fluid was accessed with a 19 gauge Yueh needle utilizing CT guidance. The catheter was removed over 0.035 inch guidewire. Tract dilation was performed with 10 French dilator. 12.0 [place] dilator could not be advanced over the guidewire, likely due to narrow inter costal spaces. 12.0 [place] multipurpose pigtail drain would not track either. 10.2 [place] multipurpose pigtail drain was successfully inserted into the left pleural space. Final position of the pigtail was confirmed with CT. Drain attached to Pleur-Evac set at -20 cm H2O. Drain secured to skin with suture. FINDINGS: Large left pleural effusion IMPRESSION: 10.2 French multipurpose pigtail drain inserted into left pleural effusion. Unable to track larger drain into left pleural space, likely due to narrow intercostal spaces. Electronically Signed   By: Miachel Roux M.D.   On: 02/21/2022 15:37   DG CHEST PORT 1 VIEW  Result Date: 02/21/2022 CLINICAL DATA:  [id number] patient with chronic kidney disease 3, diabetes, hypertension, metastatic breast cancer on treatment, left pleural effusion and status post PleurX catheter insertion EXAM: PORTABLE CHEST 1 VIEW COMPARISON:  February 20, 2022 FINDINGS: Again seen is the PleurX catheter at the left lung base. There has been complete opacification of the left thorax in comparison to lower 3/4 in the previous study of pleural effusion. Leftward deviation of the mediastinum. Right lung remains clear. The visualized skeletal structures are unremarkable. IMPRESSION: There has been complete opacification of the left thorax of pleural effusion and has worsened in the interim. Stable left basal PleurX catheter. Right lung remains clear. Electronically Signed   By: Frazier Richards M.D.   On: 02/21/2022 09:37   DG Chest Port 1 View  Result Date: 02/20/2022 CLINICAL DATA:  Malignant left  pleural effusion. EXAM: PORTABLE CHEST 1 VIEW COMPARISON:  Chest two views 02/18/2022, CT chest 02/18/2022 FINDINGS: Lung volumes are mildly decreased compared to prior. There is again homogeneous opacification of the majority of the inferior 2/3 of the left hemithorax corresponding to the lobulated and likely loculated moderate to large left pleural effusion better seen on prior CT. The right lung appears clear. No pneumothorax is seen. The visualized portions of the cardiac silhouette and mediastinal contours are unchanged. Left basilar drainage catheter appears unchanged. IMPRESSION: Mildly decreased lung volumes compared to prior. Within this limitation, there is no significant change to possibly mild interval increase in the size of moderate-to-large loculated left pleural effusion better seen on prior CT. Electronically Signed   By: Yvonne Kendall M.D.   On: 02/20/2022 09:09    Labs:  CBC: Recent Labs    02/20/22 0524 02/21/22 0538 02/22/22 0434 02/23/22 0835  WBC 6.6 6.8  5.6 4.7  HGB 8.1* 8.1* 7.9* 7.9*  HCT 25.8* 25.1* 23.9* 24.1*  PLT 245 249 271 296    COAGS: No results for input(s): "INR", "APTT" in the last 8760 hours.  BMP: Recent Labs    02/20/22 0524 02/21/22 0538 02/21/22 2001 02/22/22 0434 02/23/22 0835  NA 138 137  --  136 138  K 4.4 4.8  --  4.5 4.4  CL 110 109  --  107 113*  CO2 22 21*  --  22 19*  GLUCOSE 153* 169* 255* 173* 216*  BUN 28* 38*  --  32* 24*  CALCIUM 9.1 9.1  --  8.5* 8.7*  CREATININE 1.61* 1.95*  --  1.36* 1.11*  GFRNONAA 33* 26*  --  40* 52*    LIVER FUNCTION TESTS: Recent Labs    12/21/21 1032 01/18/22 1001 02/15/22 1037 02/18/22 1515  BILITOT 0.3 0.3 0.3 0.3  AST _0 ALT _1 ALKPHOS 60 56 59 50  PROT 7.7 7.7 7.2 7.5  ALBUMIN 4.2 4.2 4.0 3.6    Assessment and Plan:  76 y.o. female with CKD stage 3 and metastatic breast CA currently on chemo with recurrent left pleural effusion s/p left PleurX placement  09/07/21 by pulmonary, presented with malfunctioning PleurX and enlarging pleural effusion, s/p left chest tube placement with Dr. Dwaine Gale on 7/26.     The chest tube yielded 1.8 L from 7/26-7/27, yielded 45 mL from 7/27-28 CT chest yesterday showed: Diminished volume of pleural fluid along the peripheral and upper chest following placement of a pigtail drainage catheter into a malignant effusion.   CX from left pleural effusion showed no growth for 5 days at 1412 hrs  today,  the result changed to Gram positive cocci in tetrads at 1502 hours.   Request received for chest tube removal and new PleurX placement from Dr. Lamonte Sakai today.  Case discussed with Dr. Kathlene Cote, [place] to remove the chest tube as output is minimal and pleural effusion showed no growth x 5 days.   Chest tube removed at bedside.  Was unable to remove retention suture, the suture was cut at the skin level. Vaseline gauze and Tegaderm placed, keep Vaseline gauze for at least 24 hours.   After removal of the chest tube, this PA was reached out by Dr. Tyrell Antonio.  Dr. Tyrell Antonio states that she discussed the patient's case  with Dr. Lamonte Sakai, she was asked to inform this PA NOT to remove the chest tube.  Informed Dr. Frederic Jericho and Dr. Lamonte Sakai the chest tube has been removed.   Asked Dr. Lamonte Sakai to place the new PleurX placement order.   IR will re-evaluate the patient next week for new PleurX placement VS chest tube placement, once new PleurX placement order is placed.  IR will not be able to initiate evaluation for PleurX without an order.   Please call IR for questions and concerns.    Electronically Signed: Tera Mater, PA-C 02/23/2022, 3:34 PM   I spent a total of 15 Minutes at the the patient's bedside AND on the patient's hospital floor or unit, greater than 50% of which was counseling/coordinating care for left chest tube follow up.   This chart was dictated using voice recognition software.  Despite best efforts to proofread,  errors  can occur which can change the documentation meaning.

## 2022-02-23 NOTE — Progress Notes (Signed)
Physical Therapy Treatment Patient Details Name: Heather Rogers MRN: [id number] DOB: 06-11-1946 Today's Date: 02/23/2022   History of Present Illness Pt 76 yo female admitted with malignant pleural effusion s/p 2 chest tubes and pleural fibrinolysis 7/24. Pt with one chest tube removed 7/28. Hx of pleurx cath placement 09/07/21, CVA, DM, met breast cancer, CKD    PT Comments    Pt making gradual progress.  She had recently had 1 chest tube removed but other still in place.  She was on RA with sats stable.  Ambulated multiple short distances with seated rest breaks.  Continue to advance as able.     Recommendations for follow up therapy are one component of a multi-disciplinary discharge planning process, led by the attending physician.  Recommendations may be updated based on patient status, additional functional criteria and insurance authorization.  Follow Up Recommendations  Home health PT     Assistance Recommended at Discharge Frequent or constant Supervision/Assistance  Patient can return home with the following A little help with walking and/or transfers;A little help with bathing/dressing/bathroom;Assist for transportation;Assistance with cooking/housework;Help with stairs or ramp for entrance   Equipment Recommendations  Rolling walker (2 wheels)    Recommendations for Other Services       Precautions / Restrictions Precautions Precautions: Fall Precaution Comments: Chest tube; keep sats >92%     Mobility  Bed Mobility Overal bed mobility: Needs Assistance Bed Mobility: Supine to Sit, Sit to Supine     Supine to sit: Min assist Sit to supine: Min guard   General bed mobility comments: Moderate reliance on bedrail. Increased time and effort.    Transfers Overall transfer level: Needs assistance Equipment used: Rolling walker (2 wheels) Transfers: Sit to/from Stand Sit to Stand: Min assist, Min guard           General transfer comment: Sit to stand x 2  during transfers with cues for hand placement (had 1 on RW and 1 on bed) and min A to rise.  Then performed 5 x for exercise and both hands to push and min guard    Ambulation/Gait Ambulation/Gait assistance: Min guard Gait Distance (Feet): 34 Feet (22' then 63') Assistive device: Rolling walker (2 wheels), 1 person hand held assist Gait Pattern/deviations: Step-to pattern, Decreased stride length, Narrow base of support Gait velocity: decreased     General Gait Details: LImited to small bouts in room due to chest tube to suction.  Ambulated 22' then 53' with seated rest break.  Did 64' then 39' with RW -pt moving slowly and cautiously, difficutly turning walker due to tight spaces and lines.  Attempted last 34' without RW but pt with narrow BOS and occasional scissoring.   Stairs             Wheelchair Mobility    Modified Rankin (Stroke Patients Only)       Balance Overall balance assessment: Needs assistance Sitting-balance support: No upper extremity supported Sitting balance-Leahy Scale: Good     Standing balance support: Bilateral upper extremity supported, No upper extremity supported, Single extremity supported Standing balance-Leahy Scale: Fair Standing balance comment: Could static stand but not accept any challenges without at least single UE support; steady with RW                            Cognition Arousal/Alertness: Awake/alert Behavior During Therapy: WFL for tasks assessed/performed Overall Cognitive Status: Within Functional Limits for tasks assessed  Exercises      General Comments General comments (skin integrity, edema, etc.): Pt on RA with sats 95% or >      Pertinent Vitals/Pain Pain Assessment Pain Assessment: No/denies pain    Home Living                          Prior Function            PT Goals (current goals can now be found in the care plan  section) Progress towards PT goals: Progressing toward goals    Frequency    Min 3X/week      PT Plan Current plan remains appropriate    Co-evaluation              AM-PAC PT "6 Clicks" Mobility   Outcome Measure  Help needed turning from your back to your side while in a flat bed without using bedrails?: A Little Help needed moving from lying on your back to sitting on the side of a flat bed without using bedrails?: A Little Help needed moving to and from a bed to a chair (including a wheelchair)?: A Little Help needed standing up from a chair using your arms (e.g., wheelchair or bedside chair)?: A Little Help needed to walk in hospital room?: A Little Help needed climbing 3-5 steps with a railing? : A Little 6 Click Score: 18    End of Session   Activity Tolerance: Patient tolerated treatment well Patient left: with call bell/phone within reach;with family/visitor present;in bed;with bed alarm set Nurse Communication: Mobility status PT Visit Diagnosis: Unsteadiness on feet (R26.81);Muscle weakness (generalized) (M62.81);Difficulty in walking, not elsewhere classified (R26.2)     Time: 5681-2751 PT Time Calculation (min) (ACUTE ONLY): 25 min  Charges:  $Gait Training: 8-22 mins $Therapeutic Activity: 8-22 mins                     Abran Richard, PT Acute Rehab [facility] [place] 02/23/2022, 5:32 PM

## 2022-02-24 LAB — CBC
HCT: 21.5 % — ABNORMAL LOW (ref 36.0–46.0)
Hemoglobin: 7.2 g/dL — ABNORMAL LOW (ref 12.0–15.0)
MCH: 26.2 pg (ref 26.0–34.0)
MCHC: 33.5 g/dL (ref 30.0–36.0)
MCV: 78.2 fL — ABNORMAL LOW (ref 80.0–100.0)
Platelets: 287 10*3/uL (ref 150–400)
RBC: 2.75 MIL/uL — ABNORMAL LOW (ref 3.87–5.11)
RDW: 14.5 % (ref 11.5–15.5)
WBC: 3.8 10*3/uL — ABNORMAL LOW (ref 4.0–10.5)
nRBC: 0 % (ref 0.0–0.2)

## 2022-02-24 LAB — BASIC METABOLIC PANEL
Anion gap: 8 (ref 5–15)
BUN: 20 mg/dL (ref 8–23)
CO2: 19 mmol/L — ABNORMAL LOW (ref 22–32)
Calcium: 8.4 mg/dL — ABNORMAL LOW (ref 8.9–10.3)
Chloride: 112 mmol/L — ABNORMAL HIGH (ref 98–111)
Creatinine, Ser: 0.96 mg/dL (ref 0.44–1.00)
GFR, Estimated: >60 mL/min (ref >60)
Glucose, Bld: 184 mg/dL — ABNORMAL HIGH (ref 70–99)
Potassium: 4 mmol/L (ref 3.5–5.1)
Sodium: 139 mmol/L (ref 135–145)

## 2022-02-24 LAB — GLUCOSE, CAPILLARY
Glucose-Capillary: 192 mg/dL — ABNORMAL HIGH (ref 70–99)
Glucose-Capillary: 223 mg/dL — ABNORMAL HIGH (ref 70–99)
Glucose-Capillary: 195 mg/dL — ABNORMAL HIGH (ref 70–99)
Glucose-Capillary: 229 mg/dL — ABNORMAL HIGH (ref 70–99)

## 2022-02-24 MED ORDER — VANCOMYCIN HCL IN DEXTROSE 1-5 GM/200ML-% IV SOLN
1000.0000 mg | INTRAVENOUS | Status: DC
  Administered 2022-02-25: 1000 mg via INTRAVENOUS
  Filled 2022-02-24: qty 200

## 2022-02-24 MED ORDER — SODIUM CHLORIDE 0.9 % IV SOLN
2.0000 g | Freq: Two times a day (BID) | INTRAVENOUS | Status: DC
  Administered 2022-02-24 – 2022-02-26 (×5): 2 g via INTRAVENOUS
  Filled 2022-02-24 (×5): qty 12.5

## 2022-02-24 NOTE — Progress Notes (Signed)
PROGRESS NOTE    Heather Rogers  XNA:[id number] DOB: 17-Jul-1946 DOA: 02/18/2022 PCP: Charlott Rakes, MD   Brief Narrative: 76 year old with past medical history significant for metastatic left breast cancer (on active treatment with [name] and letrozole), with malignant left pleural effusion post Pleurx catheter placement on 07/07/2022, history of CVA, CKD stage IIIb, anemia of chronic disease, diabetes type 2, hypertension, hyperlipidemia presented with fevers and dyspnea.  She also had a syncope episode and reported increased output from her Pleurx catheter.  Patient was noted to have fever of 101.  CT head was negative.  CTA chest showed a large left pleural effusion with atelectasis and new left breast mass with lymphadenopathy.  CT spine, CT abdomen and pelvis was negative.  Patient was a started on IV vancomycin and cefepime.  Subsequently vancomycin was discontinued.  CCM was consulted and patient underwent instillation of fibrinolytics on 7/24.  Chest x-ray repeated 7/26 showed complete opacification of the left lung.  IR consulted for second  left side chest tube placement, at higher position, placed on 7/26.  Hospital course prolong because patient has required thrombolytics instillation through Pleurx catheter to try to help with left side  loculated pleural effusion. She subsequently require second chest tube placed at a higher position on the left side.      Assessment & Plan:   Principal Problem:   Malignant left pleural effusion Active Problems:   Syncope   Acute respiratory failure with hypoxia (HCC)   Primary malignant neoplasm of breast with metastasis (HCC)   Chronic kidney disease, stage 3b (HCC)   Type 2 diabetes mellitus (A1c 6.4 on 2/5)    Hypertension associated with diabetes ([place])   History of CVA   Anemia of chronic disease   Hyperlipidemia associated with type 2 diabetes mellitus (HCC)   1-Malignant left pleural effusion Left side Complicated, loculated  pleural effusion.  Acute hypoxic respiratory failure with hypoxia secondary to pleural effusion. #2 left breast cancer.  Patient had a Pleurx catheter as an outpatient since February 2023, started to have decreased output. -Presented with fevers.  CCM consulted and underwent instillation of fibrinolytics on 7/24. -Continue with IV cefepime. -Pleural fluid cultures: Negative -Continue with oxygen supplementation 3 L -Chest x-ray; 7/26 complete opacification of the left side -Underwent second chest tube placement at higher position yielding 1.2 L hemorrhagic fluids on 7/26. -Chest x ray improved post second chest tube placement. Plan to proceed with CT chest to further evaluate.  Appreciate CCM management.  CT chest: Diminished volume of pleural fluid and on the peripheral and upper chest following placement of pigtail drainage catheter, a small locules of gas within the diminished left-sided pleural effusion, loculated fluid on the left heart border and volume loss in the left lower chest is similar.  Perhaps slightly improved aeration  in the upper chest. Follow CCM recommendations.  Culture from 7/23 pleural fluid; now growing Gram positive cocci in tetra. Started Vancomycin.  Follow culture.  Chest tube removed 7/28.  Plan for new pleurX placement by IR>   2-AKI on CKD 3 b:  -Baseline  creatinine 1.2-1.49 -Worsening renal function 7/26, creatinine peaked to 1.9 --Renal ultrasound: Negative for hydronephrosis.  Increased cortical echogenicity suggests medical renal disease.  There are a few bilateral renal cysts. -Renal function improved today creatinine down to 1.3 -Improved with IV fluids. NSL fluids.   3-Primary malignant neoplasm of breast with metastasis Metastatic left breast cancer with malignant left pleural effusion and osseous involvement. Follow-up with Dr.  Godina On Letrozole and Versenio.  Holding due to active infection.   4-Diabetes type 2: Hemoglobin A1c 6.4 Continue  to hold metformin while inpatient. Sliding scale insulin  Hypertension associated with diabetes: Continue with amlodipine, Coreg and hydralazine  Hyperlipidemia associated with type 2 diabetes: Continue with atorvastatin  Anemia of chronic disease: hb trending down. Check anemia panel in am. If hb decreases further might need blood transfusion.   History of CVA: Continue with aspirin and statin  syncope: Likely vasovagal.  Orthostatic negative.  No significant arrhythmia on telemetry     Nutrition Problem: Increased nutrient needs Etiology: chronic illness (malignant pleural effusion)    Signs/Symptoms: estimated needs    Interventions: Ensure Enlive (each supplement provides 350kcal and 20 grams of protein), MVI, Liberalize Diet  Estimated body mass index is 20.56 kg/m as calculated from the following:   Height as of this encounter: '5\' 2"'$  (1.575 m).   Weight as of this encounter: 51 kg.   DVT prophylaxis: Heparin  Code Status: Full code Family Communication: Care discussed with daughter at bedside.  Disposition Plan:  Status is: Inpatient Remains inpatient appropriate because: management of loculated pleural effusion.     Consultants:  CCM  Procedures:  Chest tube placement 7/26  Antimicrobials:  Cefepime  Subjective: She is feeling better. She seem more active per daughter.    Objective: Vitals:   02/23/22 0935 02/23/22 1202 02/23/22 2015 02/24/22 0444  BP:  130/71 (!) 111/50 (!) 143/62  Pulse:  86 81 81  Resp:  '16 17 18  '$ Temp:  99.4 F (37.4 C) 98.7 F (37.1 C) 99 F (37.2 C)  TempSrc:  Oral Oral Oral  SpO2: 93% 96% 95% 94%  Weight:      Height:        Intake/Output Summary (Last 24 hours) at 02/24/2022 1457 Last data filed at 02/24/2022 1410 Gross per 24 hour  Intake 732.94 ml  Output 1140 ml  Net -407.06 ml    Filed Weights   02/21/22 0601 02/22/22 0500 02/23/22 0500  Weight: 47.6 kg 62.8 kg 51 kg    Examination:  General exam:  NAD Respiratory system: BL air movement.  Cardiovascular system: S 1, S 2 RRR Gastrointestinal system: BS present, soft, nt Central nervous system: Alert, follows command Extremities: No edema.    Data Reviewed: I have personally reviewed following labs and imaging studies  CBC: Recent Labs  Lab 02/18/22 1515 02/19/22 0500 02/20/22 0524 02/21/22 0538 02/22/22 0434 02/23/22 0835 02/24/22 0723  WBC 7.8   < > 6.6 6.8 5.6 4.7 3.8*  NEUTROABS 6.0  --   --   --   --   --   --   HGB 9.5*   < > 8.1* 8.1* 7.9* 7.9* 7.2*  HCT 29.3*   < > 25.8* 25.1* 23.9* 24.1* 21.5*  MCV 80.3   < > 82.4 80.7 79.4* 79.0* 78.2*  PLT 306   < > 245 249 271 296 287   < > = values in this interval not displayed.    Basic Metabolic Panel: Recent Labs  Lab 02/20/22 0524 02/21/22 0538 02/21/22 2001 02/22/22 0434 02/23/22 0835 02/24/22 0723  NA 138 137  --  136 138 139  K 4.4 4.8  --  4.5 4.4 4.0  CL 110 109  --  107 113* 112*  CO2 22 21*  --  22 19* 19*  GLUCOSE 153* 169* 255* 173* 216* 184*  BUN 28* 38*  --  32* 24*  20  CREATININE 1.61* 1.95*  --  1.36* 1.11* 0.96  CALCIUM 9.1 9.1  --  8.5* 8.7* 8.4*  MG 2.4 2.7*  --   --   --   --     GFR: Estimated Creatinine Clearance: 39.4 mL/min (by C-G formula based on SCr of 0.96 mg/dL). Liver Function Tests: Recent Labs  Lab 02/18/22 1515  AST 18  ALT 15  ALKPHOS 50  BILITOT 0.3  PROT 7.5  ALBUMIN 3.6    No results for input(s): "LIPASE", "AMYLASE" in the last 168 hours. No results for input(s): "AMMONIA" in the last 168 hours. Coagulation Profile: No results for input(s): "INR", "PROTIME" in the last 168 hours. Cardiac Enzymes: No results for input(s): "CKTOTAL", "CKMB", "CKMBINDEX", "TROPONINI" in the last 168 hours. BNP (last 3 results) No results for input(s): "PROBNP" in the last 8760 hours. HbA1C: No results for input(s): "HGBA1C" in the last 72 hours. CBG: Recent Labs  Lab 02/23/22 1117 02/23/22 1646 02/23/22 2012  02/24/22 0733 02/24/22 1133  GLUCAP 194* 203* 265* 192* 223*    Lipid Profile: No results for input(s): "CHOL", "HDL", "LDLCALC", "TRIG", "CHOLHDL", "LDLDIRECT" in the last 72 hours. Thyroid Function Tests: No results for input(s): "TSH", "T4TOTAL", "FREET4", "T3FREE", "THYROIDAB" in the last 72 hours. Anemia Panel: No results for input(s): "VITAMINB12", "FOLATE", "FERRITIN", "TIBC", "IRON", "RETICCTPCT" in the last 72 hours. Sepsis Labs: Recent Labs  Lab 02/18/22 1538 02/18/22 1625 02/19/22 1059 02/20/22 0524 02/21/22 0538  PROCALCITON  --   --  0.23 0.26 0.51  LATICACIDVEN 1.2 1.0  --   --   --      Recent Results (from the past 240 hour(s))  Blood culture (routine x 2)     Status: None   Collection Time: 02/18/22  3:14 PM   Specimen: BLOOD  Result Value Ref Range Status   Specimen Description   Final    BLOOD LEFT ANTECUBITAL Performed at [facility], [place] [address], [place], [place] [address]    Special Requests   Final    BOTTLES DRAWN AEROBIC AND ANAEROBIC Blood Culture results may not be optimal due to an inadequate volume of blood received in culture bottles Performed at [place] [address], [place], [place] [address]    Culture   Final    NO GROWTH 5 DAYS Performed at [facility], [place] [address], [place], [place] [address]    Report Status 02/23/2022 FINAL  Final  Blood culture (routine x 2)     Status: None   Collection Time: 02/18/22  3:30 PM   Specimen: BLOOD  Result Value Ref Range Status   Specimen Description   Final    BLOOD LEFT ANTECUBITAL Performed at [facility] [address] [place], [place] [address]    Special Requests   Final    BLOOD Blood Culture adequate volume Performed at [facility] [place] [address] [place], [place] [address]    Culture   Final    NO GROWTH 5 DAYS Performed at [facility], [place] [address] [place], [place]  [address]    Report Status 02/23/2022 FINAL  Final  SARS Coronavirus 2 by RT PCR (hospital order, performed in [facility] hospital lab) *cepheid single result test*     Status: None   Collection Time: 02/18/22  3:30 PM   Specimen: Nasal Swab  Result Value Ref Range Status   SARS Coronavirus 2 by RT PCR  NEGATIVE NEGATIVE Final    Comment: (NOTE) SARS-CoV-2 target nucleic acids are NOT DETECTED.  The SARS-CoV-2 RNA is generally detectable in upper and lower respiratory specimens during the acute phase of infection. The lowest concentration of SARS-CoV-2 viral copies this assay can detect is 250 copies / mL. A negative result does not preclude SARS-CoV-2 infection and should not be used as the sole basis for treatment or other patient management decisions.  A negative result may occur with improper specimen collection / handling, submission of specimen other than nasopharyngeal swab, presence of viral mutation(s) within the areas targeted by this assay, and inadequate number of viral copies (<250 copies / mL). A negative result must be combined with clinical observations, patient history, and epidemiological information.  Fact Sheet for Patients:   https://www.patel.info/  Fact Sheet for Healthcare Providers: https://hall.com/  This test is not yet approved or  cleared by the [place] FDA and has been authorized for detection and/or diagnosis of SARS-CoV-2 by FDA under an Emergency Use Authorization (EUA).  This EUA will remain in effect (meaning this test can be used) for the duration of the COVID-19 declaration under Section 564(b)(1) of the Act, 21 U.S.C. section 360bbb-3(b)(1), unless the authorization is terminated or revoked sooner.  Performed at [facility], [place] [address] [place], [place] [address]   Urine Culture     Status: None   Collection Time: 02/18/22  4:17 PM   Specimen: Urine, Clean Catch  Result  Value Ref Range Status   Specimen Description   Final    URINE, CLEAN CATCH Performed at [facility], [place] [address] [place], [place] [address]    Special Requests   Final    NONE Performed at [facility], [place] [address] [place], [place] [address]    Culture   Final    NO GROWTH Performed at [facility], [place] [address] [place], [place] [address]    Report Status 02/19/2022 FINAL  Final  Culture, body fluid w Gram Stain-bottle     Status: None (Preliminary result)   Collection Time: 02/18/22  7:04 PM   Specimen: Fluid  Result Value Ref Range Status   Specimen Description FLUID PLEURAL  Final   Special Requests   Final    BOTTLES DRAWN AEROBIC AND ANAEROBIC Blood Culture adequate volume   Gram Stain   Final    GRAM POSITIVE COCCI IN TETRADS BOTTLES DRAWN AEROBIC ONLY CRITICAL RESULT CALLED TO, READ BACK BY AND VERIFIED WITH: RN Francie Massing [phone] '@1350'$  FH    Culture   Final    MODERATE STAPHYLOCOCCUS EPIDERMIDIS SUSCEPTIBILITIES TO FOLLOW Performed at [facility], [place] [address] [place], [place] [address]    Report Status PENDING  Incomplete  Gram stain     Status: None   Collection Time: 02/18/22  7:04 PM   Specimen: Fluid  Result Value Ref Range Status   Specimen Description FLUID PLEURAL  Final   Special Requests NONE  Final   Gram Stain   Final    FEW WBC PRESENT,BOTH PMN AND MONONUCLEAR NO ORGANISMS SEEN Performed at [facility], [address], [place], [place] [address]    Report Status 02/19/2022 FINAL  Final  MRSA Next Gen by PCR, Nasal     Status: None   Collection Time: 02/19/22 12:32 PM   Specimen: Nasal Mucosa; Nasal Swab  Result Value Ref Range Status   MRSA by PCR Next Gen NOT  DETECTED NOT DETECTED Final    Comment: (NOTE) The GeneXpert MRSA Assay (FDA approved for NASAL specimens only), is one component of a comprehensive MRSA colonization surveillance program. It is not intended to  diagnose MRSA infection nor to guide or monitor treatment for MRSA infections. Test performance is not FDA approved in patients less than 29 years old. Performed at [facility], [place] [address], [place], [place] [address]          Radiology Studies: No results found.      Scheduled Meds:  amLODipine  10 mg Oral Daily   aspirin EC  81 mg Oral Daily   atorvastatin  80 mg Oral QPM   carvedilol  25 mg Oral BID WC   feeding supplement  237 mL Oral BID BM   gabapentin  100 mg Oral TID   heparin  5,000 Units Subcutaneous Q8H   hydrALAZINE  50 mg Oral TID   insulin aspart  0-9 Units Subcutaneous TID WC   multivitamin with minerals  1 tablet Oral Daily   polyethylene glycol  17 g Oral Daily   sodium chloride flush  10 mL Intrapleural Q8H   sodium chloride flush  3 mL Intravenous Q12H   Continuous Infusions:  ceFEPime (MAXIPIME) IV 2 g (02/24/22 1453)   [START ON 02/25/2022] vancomycin       LOS: 6 days    Time spent: 35 minutes    Inice Sanluis A Finnlee Silvernail, MD [facility]   If 7PM-7AM, please contact night-coverage [email or web address]  02/24/2022, 2:57 PM

## 2022-02-24 NOTE — Progress Notes (Signed)
Pharmacy Antibiotic Note  Heather Rogers is a 75 y.o. female admitted on 02/18/2022 with pneumonia.  Pharmacy has been consulted for Vanco dosing.  ID: worsening malignant L pleural effusion +/- PNA - Pleurx in place, Tpa/dornase 7/24, 7/25  - Scr 1.11 > 0.96  Antimicrobials this admission: 7/23 cefepime >>  7/23 vanc >> 7/25, 7/28 >>  Microbiology results: 7/23 BCx: ngtd 7/23 UCx: NGF 7/23 Pleural fluid: staph epi, pending susceptibilities 7/24 MRSA PCR: neg  Plan: -Change cefepime to 2 g IV q12h -Change vancomycin to 1 g IV q36h -Continue to follow renal function, cultures and clinical progress for dose adjustments and de-escalation as indicated     Height: '5\' 2"'$  (157.5 cm) Weight: 51 kg (112 lb 6.4 oz) IBW/kg (Calculated) : 50.1  Temp (24hrs), Avg:98.9 F (37.2 C), Min:98.7 F (37.1 C), Max:99 F (37.2 C)  Recent Labs  Lab 02/18/22 1538 02/18/22 1625 02/19/22 0500 02/20/22 0524 02/21/22 0538 02/22/22 0434 02/23/22 0835 02/24/22 0723  WBC  --   --    < > 6.6 6.8 5.6 4.7 3.8*  CREATININE  --   --    < > 1.61* 1.95* 1.36* 1.11* 0.96  LATICACIDVEN 1.2 1.0  --   --   --   --   --   --    < > = values in this interval not displayed.     Estimated Creatinine Clearance: 39.4 mL/min (by C-G formula based on SCr of 0.96 mg/dL).    Allergies  Allergen Reactions   Sulfa Antibiotics     Itching       Tawnya Crook, PharmD, BCPS Clinical Pharmacist 02/24/2022 12:47 PM

## 2022-02-24 NOTE — Progress Notes (Signed)
NAME:  ALASKA FLETT, MRN:  [id number], DOB:  05-20-1946, LOS: 6 ADMISSION DATE:  02/18/2022, CONSULTATION DATE:  7/24 REFERRING MD:  Dr. Manuella Ghazi, CHIEF COMPLAINT:  SOB   BRIEF  76 year old female with past medical history as below, which is significant for CKD 3, diabetes, hypertension, metastatic breast cancer for which she is currently undergoing treatment with [name] and letrozole.  She has Pleurx catheter on the left which is followed by Dr. Verlee Monte in the pulmonary clinic.  She was last seen in April and at that time was draining the Pleurx every other day.  Output decreased and she was able to drain about every 4 days.  Over the course of a couple of weeks her output has significantly increased to the point where her daughter was draining every 2 to 3 days with outputs around 2-300.  On 7/23 the patient developed fever and suffered a syncopal episode for which she presented to [facility] emergency department.  Upon arrival to the emergency department she was hypoxic requiring 2 L of supplemental oxygen and was febrile to 101F.  Work-up in the emergency department included CT angiogram of the chest demonstrating large pleural effusion on the left with areas concerning for loculation. Pleurx vacuum bottle attached in ED and only drained a small amount of fluid. PCCM consulted for Pleurx and effusion management.    Pertinent  Medical History   has a past medical history of Chronic kidney disease, stage 3b ([place]), Diabetes ([place]), Hypertension, Pneumonia due to COVID-19 virus (07/25/2020), Primary malignant neoplasm of breast with metastasis ([place]) (09/14/2021), Stroke (cerebrum) ([place]), and Stroke ([place]) (05/15/2018).   Significant Hospital Events: Including procedures, antibiotic start and stop dates in addition to other pertinent events   02/18/2022  - wrosening left effusionloculation v , concern for obstruction of baseline left pleurx -> s/p ip fibrinolytucs -f rist dose 7/23 fluid culture 7/23  - covid neg 7/23 MRSA neg 7/23 Blood c/s 7/25 - 3L -  97%. New o2 need.  Per daughter - till Sunday 02/18/22 pleurx was draining well. Same day fever too. Then "passed out" came to ER -> found to have worsening effusion and loculated. Now has reponded to first dose intrapleural fibrinolytic but CXR looks unchnaged. Daughter feels she is a bit more tired today. FEbrile 101/.74F yesteday  2nd DOSE IP LYITC ABG without hypercapnia - patient was sleepy 7/26- still on 3L . No drainage after 2nd dose IP Lytic per RN - only 25cc ovrnight.  Pr daughter [name] - > patient did not turn per protocol and then went to sleep. But daugther also feels -> patient did drain well after 2nd dose. However,  Pleurx -> at bottom.  Still febrile. Eating BF. Oriented and stable per RN. WBC normal 2nd 10 F ches ttube at higher levvel by IR - > drained 1.2L hemorrhagic fluid straight away   7/28 IR chest tube removed   Interim History / Subjective:   IR placed chest tube removed yesterday.  The Pleurx catheter has started output today with 340 cc output. Respiratory status is stable with no complaints  Objective   Blood pressure (!) 143/62, pulse 81, temperature 99 F (37.2 C), temperature source Oral, resp. rate 18, height '5\' 2"'$  (1.575 m), weight 51 kg, SpO2 94 %.        Intake/Output Summary (Last 24 hours) at 02/24/2022 0915 Last data filed at 02/24/2022 0630 Gross per 24 hour  Intake 733.33 ml  Output 1140 ml  Net -406.67 ml  Filed Weights   02/21/22 0601 02/22/22 0500 02/23/22 0500  Weight: 47.6 kg 62.8 kg 51 kg    Examination: Blood pressure (!) 143/62, pulse 81, temperature 99 F (37.2 C), temperature source Oral, resp. rate 18, height '5\' 2"'$  (1.575 m), weight 51 kg, SpO2 94 %. Gen:      No acute distress HEENT:  EOMI, sclera anicteric Neck:     No masses; no thyromegaly Lungs:    Clear to auscultation bilaterally; normal respiratory effort CV:         Regular rate and rhythm; no murmurs Abd:       + bowel sounds; soft, non-tender; no palpable masses, no distension Ext:    No edema; adequate peripheral perfusion Skin:      Warm and dry; no rash Neuro: alert and oriented x 3 Psych: normal mood and affect     Resolved Hospital Problem list     Assessment & Plan:   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  Pleurx placed in February 2023 - Left loculated effusion  of above - Present on Admit.  Still no significant output after tPA dwell in the Pleurx.  Pigtail removed yesterday after drainage dropped off Pleurx now draining adequately and we will keep in place and monitor Repeat chest x-ray on Monday and determine if pleurex needs to be replaced  We will follow back on Monday 31st.  Please call with any questions over the weekend   Best Practice (right click and "Reselect all SmartList Selections" daily)   Daughter update at bedside on 7/29  Kerman Passey MD [facility] See Amion for pager  If no response to pager , please call [phone] until 7pm After 7:00 pm call Elink  [phone] 02/24/2022, 9:16 AM

## 2022-02-25 LAB — IRON AND TIBC
Iron: 32 ug/dL (ref 28–170)
Saturation Ratios: 22 % (ref 10.4–31.8)
TIBC: 144 ug/dL — ABNORMAL LOW (ref 250–450)
UIBC: 112 ug/dL

## 2022-02-25 LAB — RETICULOCYTES
Immature Retic Fract: 12.4 % (ref 2.3–15.9)
RBC.: 2.7 MIL/uL — ABNORMAL LOW (ref 3.87–5.11)
Retic Count, Absolute: 32.4 10*3/uL (ref 19.0–186.0)
Retic Ct Pct: 1.2 % (ref 0.4–3.1)

## 2022-02-25 LAB — CULTURE, BODY FLUID W GRAM STAIN -BOTTLE: Special Requests: ADEQUATE

## 2022-02-25 LAB — GLUCOSE, CAPILLARY
Glucose-Capillary: 201 mg/dL — ABNORMAL HIGH (ref 70–99)
Glucose-Capillary: 217 mg/dL — ABNORMAL HIGH (ref 70–99)
Glucose-Capillary: 150 mg/dL — ABNORMAL HIGH (ref 70–99)
Glucose-Capillary: 166 mg/dL — ABNORMAL HIGH (ref 70–99)

## 2022-02-25 LAB — BASIC METABOLIC PANEL
Anion gap: 5 (ref 5–15)
BUN: 17 mg/dL (ref 8–23)
CO2: 20 mmol/L — ABNORMAL LOW (ref 22–32)
Calcium: 8.7 mg/dL — ABNORMAL LOW (ref 8.9–10.3)
Chloride: 114 mmol/L — ABNORMAL HIGH (ref 98–111)
Creatinine, Ser: 0.93 mg/dL (ref 0.44–1.00)
GFR, Estimated: >60 mL/min (ref >60)
Glucose, Bld: 183 mg/dL — ABNORMAL HIGH (ref 70–99)
Potassium: 4.6 mmol/L (ref 3.5–5.1)
Sodium: 139 mmol/L (ref 135–145)

## 2022-02-25 LAB — CBC
HCT: 21.1 % — ABNORMAL LOW (ref 36.0–46.0)
Hemoglobin: 7 g/dL — ABNORMAL LOW (ref 12.0–15.0)
MCH: 25.5 pg — ABNORMAL LOW (ref 26.0–34.0)
MCHC: 33.2 g/dL (ref 30.0–36.0)
MCV: 77 fL — ABNORMAL LOW (ref 80.0–100.0)
Platelets: 307 10*3/uL (ref 150–400)
RBC: 2.74 MIL/uL — ABNORMAL LOW (ref 3.87–5.11)
RDW: 14.1 % (ref 11.5–15.5)
WBC: 4.4 10*3/uL (ref 4.0–10.5)
nRBC: 0.5 % — ABNORMAL HIGH (ref 0.0–0.2)

## 2022-02-25 LAB — FERRITIN: Ferritin: 1931 ng/mL — ABNORMAL HIGH (ref 11–307)

## 2022-02-25 LAB — VITAMIN B12: Vitamin B-12: 1237 pg/mL — ABNORMAL HIGH (ref 180–914)

## 2022-02-25 LAB — FOLATE: Folate: 27.8 ng/mL (ref >5.9)

## 2022-02-25 NOTE — TOC Progression Note (Signed)
Transition of Care [facility]) - Progression Note    Patient Details  Name: Heather Rogers MRN: [id number] Date of Birth: 11-03-1945  Transition of Care [facility]) CM/SW Contact  Maliah Pyles, [place], [place] Phone Number: 02/25/2022, 5:49 PM  Clinical Narrative:     CSW spoke to [place] at [facility], he thinks patient is currently open to them for [facility] PT and OT.  Weekday TOC worker to follow up with [facility] to confirm patient is open to them.  TOC to continue to follow patient's progress throughout discharge planning.       Expected Discharge Plan and Services                                                 Social Determinants of Health (SDOH) Interventions    Readmission Risk Interventions     No data to display

## 2022-02-25 NOTE — Progress Notes (Signed)
PROGRESS NOTE    Heather Rogers  HGD:[id number] DOB: May 21, 1946 DOA: 02/18/2022 PCP: Charlott Rakes, MD   Brief Narrative: 76 year old with past medical history significant for metastatic left breast cancer (on active treatment with [name] and letrozole), with malignant left pleural effusion post Pleurx catheter placement on 07/07/2022, history of CVA, CKD stage IIIb, anemia of chronic disease, diabetes type 2, hypertension, hyperlipidemia presented with fevers and dyspnea.  She also had a syncope episode and reported increased output from her Pleurx catheter.  Patient was noted to have fever of 101.  CT head was negative.  CTA chest showed a large left pleural effusion with atelectasis and new left breast mass with lymphadenopathy.  CT spine, CT abdomen and pelvis was negative.  Patient was a started on IV vancomycin and cefepime.  Subsequently vancomycin was discontinued.  CCM was consulted and patient underwent instillation of fibrinolytics on 7/24.  Chest x-ray repeated 7/26 showed complete opacification of the left lung.  IR consulted for second  left side chest tube placement, at higher position, placed on 7/26.  Hospital course prolong because patient has required thrombolytics instillation through Pleurx catheter to try to help with left side  loculated pleural effusion. She subsequently require second chest tube placed at a higher position on the left side.      Assessment & Plan:   Principal Problem:   Malignant left pleural effusion Active Problems:   Syncope   Acute respiratory failure with hypoxia (HCC)   Primary malignant neoplasm of breast with metastasis (HCC)   Chronic kidney disease, stage 3b (HCC)   Type 2 diabetes mellitus (A1c 6.4 on 2/5)    Hypertension associated with diabetes ([place])   History of CVA   Anemia of chronic disease   Hyperlipidemia associated with type 2 diabetes mellitus (HCC)   1-Malignant left pleural effusion Left side Complicated, loculated  pleural effusion.  Acute hypoxic respiratory failure with hypoxia secondary to pleural effusion. #2 left breast cancer.  Patient had a Pleurx catheter as an outpatient since February 2023, started to have decreased output. -Presented with fevers.  CCM consulted and underwent instillation of fibrinolytics on 7/24. -Continue with IV cefepime. -Chest x-ray; 7/26 complete opacification of the left side -Underwent second chest tube placement at higher position yielding 1.2 L hemorrhagic fluids on 7/26. -Chest x ray improved post second chest tube placement. Plan to proceed with CT chest to further evaluate.  Appreciate CCM management.  CT chest: Diminished volume of pleural fluid and on the peripheral and upper chest following placement of pigtail drainage catheter, a small locules of gas within the diminished left-sided pleural effusion, loculated fluid on the left heart border and volume loss in the left lower chest is similar.  Perhaps slightly improved aeration  in the upper chest. Culture from 7/23 pleural fluid; now growing Staph epidermidis. Started Vancomycin.  Will consult ID  Chest tube removed 7/28.  Plan for new pleurX placement by IR> repeat chest x ray tomorrow.   2-AKI on CKD 3 b:  -Baseline  creatinine 1.2-1.49 -Worsening renal function 7/26, creatinine peaked to 1.9 --Renal ultrasound: Negative for hydronephrosis.  Increased cortical echogenicity suggests medical renal disease.  There are a few bilateral renal cysts. -Renal function improved today creatinine down to 1.3 -Improved with IV fluids. NSL fluids.   3-Primary malignant neoplasm of breast with metastasis Metastatic left breast cancer with malignant left pleural effusion and osseous involvement. Follow-up with Dr. Payton Mccallum On Letrozole and Versenio.  Holding due to active infection.  4-Diabetes type 2: Hemoglobin A1c 6.4 Continue to hold metformin while inpatient. Sliding scale insulin  Hypertension associated with  diabetes: Continue with amlodipine, Coreg and hydralazine  Hyperlipidemia associated with type 2 diabetes: Continue with atorvastatin  Anemia of chronic disease:  -Hb trending down. anemia panel anemia of chronic diseases.  - If hb decreases further might need blood transfusion.   History of CVA: Continue with aspirin and statin  syncope: Likely vasovagal.  Orthostatic negative.  No significant arrhythmia on telemetry     Nutrition Problem: Increased nutrient needs Etiology: chronic illness (malignant pleural effusion)    Signs/Symptoms: estimated needs    Interventions: Ensure Enlive (each supplement provides 350kcal and 20 grams of protein), MVI, Liberalize Diet  Estimated body mass index is 25.44 kg/m as calculated from the following:   Height as of this encounter: '5\' 2"'$  (1.575 m).   Weight as of this encounter: 63.1 kg.   DVT prophylaxis: Heparin  Code Status: Full code Family Communication: Care discussed with daughter at bedside. 7/29 Disposition Plan:  Status is: Inpatient Remains inpatient appropriate because: management of loculated pleural effusion.     Consultants:  CCM  Procedures:  Chest tube placement 7/26  Antimicrobials:  Cefepime  Subjective: Dyspnea stable. Denies worsening dyspnea.   Objective: Vitals:   02/24/22 2113 02/25/22 0610 02/25/22 1225 02/25/22 1327  BP: (!) 140/55 (!) 150/56  131/74  Pulse: 86 86  78  Resp:    18  Temp: 99.7 F (37.6 C) 98.7 F (37.1 C)  98.6 F (37 C)  TempSrc: Oral Oral  Oral  SpO2: 97% 95% 97% 99%  Weight:      Height:        Intake/Output Summary (Last 24 hours) at 02/25/2022 1630 Last data filed at 02/25/2022 1100 Gross per 24 hour  Intake --  Output 1040 ml  Net -1040 ml    Filed Weights   02/22/22 0500 02/23/22 0500 02/24/22 1653  Weight: 62.8 kg 51 kg 63.1 kg    Examination:  General exam: NAD Respiratory system: BL air movement.  Cardiovascular system: S 1, S 2  RRR Gastrointestinal system: BS present, soft,. nt Central nervous system: Alert, follows command Extremities: No edema  Data Reviewed: I have personally reviewed following labs and imaging studies  CBC: Recent Labs  Lab 02/21/22 0538 02/22/22 0434 02/23/22 0835 02/24/22 0723 02/25/22 0348  WBC 6.8 5.6 4.7 3.8* 4.4  HGB 8.1* 7.9* 7.9* 7.2* 7.0*  HCT 25.1* 23.9* 24.1* 21.5* 21.1*  MCV 80.7 79.4* 79.0* 78.2* 77.0*  PLT 249 271 296 287 814    Basic Metabolic Panel: Recent Labs  Lab 02/20/22 0524 02/21/22 0538 02/21/22 2001 02/22/22 0434 02/23/22 0835 02/24/22 0723 02/25/22 0348  NA 138 137  --  136 138 139 139  K 4.4 4.8  --  4.5 4.4 4.0 4.6  CL 110 109  --  107 113* 112* 114*  CO2 22 21*  --  22 19* 19* 20*  GLUCOSE 153* 169* 255* 173* 216* 184* 183*  BUN 28* 38*  --  32* 24* 20 17  CREATININE 1.61* 1.95*  --  1.36* 1.11* 0.96 0.93  CALCIUM 9.1 9.1  --  8.5* 8.7* 8.4* 8.7*  MG 2.4 2.7*  --   --   --   --   --     GFR: Estimated Creatinine Clearance: 44.9 mL/min (by C-G formula based on SCr of 0.93 mg/dL). Liver Function Tests: No results for input(s): "AST", "ALT", "ALKPHOS", "BILITOT", "  PROT", "ALBUMIN" in the last 168 hours.  No results for input(s): "LIPASE", "AMYLASE" in the last 168 hours. No results for input(s): "AMMONIA" in the last 168 hours. Coagulation Profile: No results for input(s): "INR", "PROTIME" in the last 168 hours. Cardiac Enzymes: No results for input(s): "CKTOTAL", "CKMB", "CKMBINDEX", "TROPONINI" in the last 168 hours. BNP (last 3 results) No results for input(s): "PROBNP" in the last 8760 hours. HbA1C: No results for input(s): "HGBA1C" in the last 72 hours. CBG: Recent Labs  Lab 02/24/22 1638 02/24/22 2111 02/25/22 0723 02/25/22 1135 02/25/22 1628  GLUCAP 195* 229* 201* 217* 150*    Lipid Profile: No results for input(s): "CHOL", "HDL", "LDLCALC", "TRIG", "CHOLHDL", "LDLDIRECT" in the last 72 hours. Thyroid Function  Tests: No results for input(s): "TSH", "T4TOTAL", "FREET4", "T3FREE", "THYROIDAB" in the last 72 hours. Anemia Panel: Recent Labs    02/25/22 0348  VITAMINB12 1,237*  FOLATE 27.8  FERRITIN 1,931*  TIBC 144*  IRON 32  RETICCTPCT 1.2   Sepsis Labs: Recent Labs  Lab 02/19/22 1059 02/20/22 0524 02/21/22 0538  PROCALCITON 0.23 0.26 0.51     Recent Results (from the past 240 hour(s))  Blood culture (routine x 2)     Status: None   Collection Time: 02/18/22  3:14 PM   Specimen: BLOOD  Result Value Ref Range Status   Specimen Description   Final    BLOOD LEFT ANTECUBITAL Performed at [facility], [place] [address] [place], [place] [address]    Special Requests   Final    BOTTLES DRAWN AEROBIC AND ANAEROBIC Blood Culture results may not be optimal due to an inadequate volume of blood received in culture bottles Performed at [place] [address], [place], [place] [address]    Culture   Final    NO GROWTH 5 DAYS Performed at [facility], [place] [address], [place], [place] [address]    Report Status 02/23/2022 FINAL  Final  Blood culture (routine x 2)     Status: None   Collection Time: 02/18/22  3:30 PM   Specimen: BLOOD  Result Value Ref Range Status   Specimen Description   Final    BLOOD LEFT ANTECUBITAL Performed at [place] [address], [place], [place] [address]    Special Requests   Final    BLOOD Blood Culture adequate volume Performed at [place] [address], [place],  [address]    Culture   Final    NO GROWTH 5 DAYS Performed at [facility], [place] [address] [place],  [address]    Report Status 02/23/2022 FINAL  Final  SARS Coronavirus 2 by RT PCR (hospital order, performed in [facility] hospital lab) *cepheid single result test*     Status: None   Collection Time: 02/18/22  3:30 PM   Specimen: Nasal Swab  Result Value Ref Range  Status   SARS Coronavirus 2 by RT PCR NEGATIVE NEGATIVE Final    Comment: (NOTE) SARS-CoV-2 target nucleic acids are NOT DETECTED.  The SARS-CoV-2 RNA is generally detectable in upper and lower respiratory specimens during the acute phase of infection. The lowest concentration of SARS-CoV-2 viral copies this assay can detect is 250 copies / mL. A negative result does not preclude SARS-CoV-2 infection and should not be used as the sole basis for treatment or other patient management decisions.  A negative result may occur with improper specimen collection / handling, submission of  specimen other than nasopharyngeal swab, presence of viral mutation(s) within the areas targeted by this assay, and inadequate number of viral copies (<250 copies / mL). A negative result must be combined with clinical observations, patient history, and epidemiological information.  Fact Sheet for Patients:   https://www.patel.info/  Fact Sheet for Healthcare Providers: https://hall.com/  This test is not yet approved or  cleared by the [place] FDA and has been authorized for detection and/or diagnosis of SARS-CoV-2 by FDA under an Emergency Use Authorization (EUA).  This EUA will remain in effect (meaning this test can be used) for the duration of the COVID-19 declaration under Section 564(b)(1) of the Act, 21 U.S.C. section 360bbb-3(b)(1), unless the authorization is terminated or revoked sooner.  Performed at [facility], [place] [address], [place], [place] [address]   Urine Culture     Status: None   Collection Time: 02/18/22  4:17 PM   Specimen: Urine, Clean Catch  Result Value Ref Range Status   Specimen Description   Final    URINE, CLEAN CATCH Performed at [facility], [place] [address] [place], [place] [address]    Special Requests   Final    NONE Performed at [facility], [place]  [address] [place], [place] [address]    Culture   Final    NO GROWTH Performed at [facility], [place] [address], [place], [place] [address]    Report Status 02/19/2022 FINAL  Final  Culture, body fluid w Gram Stain-bottle     Status: None   Collection Time: 02/18/22  7:04 PM   Specimen: Fluid  Result Value Ref Range Status   Specimen Description FLUID PLEURAL  Final   Special Requests   Final    BOTTLES DRAWN AEROBIC AND ANAEROBIC Blood Culture adequate volume   Gram Stain   Final    GRAM POSITIVE COCCI IN TETRADS BOTTLES DRAWN AEROBIC ONLY CRITICAL RESULT CALLED TO, READ BACK BY AND VERIFIED WITH: RN Francie Massing [phone] '@[id number]'$  FH Performed at [facility], [place] [address] [place], [place] [address]    Culture MODERATE STAPHYLOCOCCUS EPIDERMIDIS  Final   Report Status 02/25/2022 FINAL  Final   Organism ID, Bacteria STAPHYLOCOCCUS EPIDERMIDIS  Final      Susceptibility   Staphylococcus epidermidis - MIC*    CIPROFLOXACIN >=8 RESISTANT Resistant     ERYTHROMYCIN >=8 RESISTANT Resistant     GENTAMICIN <=0.5 SENSITIVE Sensitive     OXACILLIN >=4 RESISTANT Resistant     TETRACYCLINE 2 SENSITIVE Sensitive     VANCOMYCIN 1 SENSITIVE Sensitive     TRIMETH/SULFA <=10 SENSITIVE Sensitive     CLINDAMYCIN >=8 RESISTANT Resistant     RIFAMPIN <=0.5 SENSITIVE Sensitive     Inducible Clindamycin NEGATIVE Sensitive     * MODERATE STAPHYLOCOCCUS EPIDERMIDIS  Gram stain     Status: None   Collection Time: 02/18/22  7:04 PM   Specimen: Fluid  Result Value Ref Range Status   Specimen Description FLUID PLEURAL  Final   Special Requests NONE  Final   Gram Stain   Final    FEW WBC PRESENT,BOTH PMN AND MONONUCLEAR NO ORGANISMS SEEN Performed at [facility], [place] [address] [place], [place] [address]    Report Status 02/19/2022 FINAL  Final  MRSA Next Gen by PCR, Nasal     Status: None   Collection Time: 02/19/22 12:32 PM   Specimen: Nasal Mucosa; Nasal Swab  Result Value  Ref  Range Status   MRSA by PCR Next Gen NOT DETECTED NOT DETECTED Final    Comment: (NOTE) The GeneXpert MRSA Assay (FDA approved for NASAL specimens only), is one component of a comprehensive MRSA colonization surveillance program. It is not intended to diagnose MRSA infection nor to guide or monitor treatment for MRSA infections. Test performance is not FDA approved in patients less than 11 years old. Performed at [facility], [place] [address], [place], [place] [address]          Radiology Studies: No results found.      Scheduled Meds:  amLODipine  10 mg Oral Daily   aspirin EC  81 mg Oral Daily   atorvastatin  80 mg Oral QPM   carvedilol  25 mg Oral BID WC   feeding supplement  237 mL Oral BID BM   gabapentin  100 mg Oral TID   heparin  5,000 Units Subcutaneous Q8H   hydrALAZINE  50 mg Oral TID   insulin aspart  0-9 Units Subcutaneous TID WC   multivitamin with minerals  1 tablet Oral Daily   polyethylene glycol  17 g Oral Daily   sodium chloride flush  10 mL Intrapleural Q8H   sodium chloride flush  3 mL Intravenous Q12H   Continuous Infusions:  ceFEPime (MAXIPIME) IV 2 g (02/25/22 0809)   vancomycin 1,000 mg (02/25/22 0629)     LOS: 7 days    Time spent: 35 minutes    Brileigh Sevcik A Avrianna Smart, MD [facility]   If 7PM-7AM, please contact night-coverage [email or web address]  02/25/2022, 4:30 PM

## 2022-02-26 ENCOUNTER — Inpatient Hospital Stay (HOSPITAL_COMMUNITY): Payer: Medicare HMO

## 2022-02-26 DIAGNOSIS — C7981 Secondary malignant neoplasm of breast: Secondary | ICD-10-CM | POA: Diagnosis not present

## 2022-02-26 DIAGNOSIS — N1832 Chronic kidney disease, stage 3b: Secondary | ICD-10-CM | POA: Diagnosis not present

## 2022-02-26 DIAGNOSIS — J910 Malignant pleural effusion: Secondary | ICD-10-CM | POA: Diagnosis not present

## 2022-02-26 DIAGNOSIS — R509 Fever, unspecified: Secondary | ICD-10-CM

## 2022-02-26 DIAGNOSIS — J9601 Acute respiratory failure with hypoxia: Secondary | ICD-10-CM | POA: Diagnosis not present

## 2022-02-26 DIAGNOSIS — I3139 Other pericardial effusion (noninflammatory): Secondary | ICD-10-CM | POA: Diagnosis not present

## 2022-02-26 LAB — ECHOCARDIOGRAM LIMITED
Height: 62 in
S' Lateral: 1.2 cm
Weight: 2303.37 oz

## 2022-02-26 LAB — BASIC METABOLIC PANEL
Anion gap: 6 (ref 5–15)
BUN: 12 mg/dL (ref 8–23)
CO2: 20 mmol/L — ABNORMAL LOW (ref 22–32)
Calcium: 8.8 mg/dL — ABNORMAL LOW (ref 8.9–10.3)
Chloride: 113 mmol/L — ABNORMAL HIGH (ref 98–111)
Creatinine, Ser: 0.78 mg/dL (ref 0.44–1.00)
GFR, Estimated: >60 mL/min (ref >60)
Glucose, Bld: 150 mg/dL — ABNORMAL HIGH (ref 70–99)
Potassium: 4.1 mmol/L (ref 3.5–5.1)
Sodium: 139 mmol/L (ref 135–145)

## 2022-02-26 LAB — CBC
HCT: 21.4 % — ABNORMAL LOW (ref 36.0–46.0)
Hemoglobin: 7 g/dL — ABNORMAL LOW (ref 12.0–15.0)
MCH: 25.8 pg — ABNORMAL LOW (ref 26.0–34.0)
MCHC: 32.7 g/dL (ref 30.0–36.0)
MCV: 79 fL — ABNORMAL LOW (ref 80.0–100.0)
Platelets: 325 10*3/uL (ref 150–400)
RBC: 2.71 MIL/uL — ABNORMAL LOW (ref 3.87–5.11)
RDW: 14.2 % (ref 11.5–15.5)
WBC: 4.8 10*3/uL (ref 4.0–10.5)
nRBC: 0 % (ref 0.0–0.2)

## 2022-02-26 LAB — GLUCOSE, CAPILLARY
Glucose-Capillary: 156 mg/dL — ABNORMAL HIGH (ref 70–99)
Glucose-Capillary: 120 mg/dL — ABNORMAL HIGH (ref 70–99)
Glucose-Capillary: 146 mg/dL — ABNORMAL HIGH (ref 70–99)

## 2022-02-26 MED ORDER — SODIUM CHLORIDE (PF) 0.9 % IJ SOLN
10.0000 mg | Freq: Once | INTRAMUSCULAR | Status: DC
  Filled 2022-02-26: qty 10

## 2022-02-26 MED ORDER — AMOXICILLIN-POT CLAVULANATE 875-125 MG PO TABS
1.0000 | ORAL_TABLET | Freq: Two times a day (BID) | ORAL | Status: DC
  Administered 2022-02-26 – 2022-03-01 (×6): 1 via ORAL
  Filled 2022-02-26 (×6): qty 1

## 2022-02-26 MED ORDER — SODIUM CHLORIDE 0.9% FLUSH
10.0000 mL | Freq: Three times a day (TID) | INTRAVENOUS | Status: DC
  Administered 2022-02-26 – 2022-02-27 (×2): 10 mL via INTRAPLEURAL

## 2022-02-26 NOTE — Progress Notes (Signed)
PROGRESS NOTE    Heather Rogers  ZOX:[id number] DOB: [birth year] DOA: 02/18/2022 PCP: Charlott Rakes, MD   Brief Narrative: 76 year old with past medical history significant for metastatic left breast cancer (on active treatment with [name] and letrozole), with malignant left pleural effusion post Pleurx catheter placement on 07/07/2022, history of CVA, CKD stage IIIb, anemia of chronic disease, diabetes type 2, hypertension, hyperlipidemia presented with fevers and dyspnea.  She also had a syncope episode and reported increased output from her Pleurx catheter.  Patient was noted to have fever of 101.  CT head was negative.  CTA chest showed a large left pleural effusion with atelectasis and new left breast mass with lymphadenopathy.  CT spine, CT abdomen and pelvis was negative.  Patient was a started on IV vancomycin and cefepime.  Subsequently vancomycin was discontinued.  CCM was consulted and patient underwent instillation of fibrinolytics on 7/24.  Chest x-ray repeated 7/26 showed complete opacification of the left lung.  IR consulted for second  left side chest tube placement, at higher position, placed on 7/26.  Hospital course prolong because patient has required thrombolytics instillation through Pleurx catheter to try to help with left side  loculated pleural effusion. She subsequently require second chest tube placed at a higher position on the left side.      Assessment & Plan:   Principal Problem:   Malignant left pleural effusion Active Problems:   Syncope   Acute respiratory failure with hypoxia (HCC)   Primary malignant neoplasm of breast with metastasis (HCC)   Chronic kidney disease, stage 3b (HCC)   Type 2 diabetes mellitus (A1c 6.4 on 2/5)    Hypertension associated with diabetes ([place])   History of CVA   Anemia of chronic disease   Hyperlipidemia associated with type 2 diabetes mellitus (HCC)   1-Malignant left pleural effusion Left side Complicated, loculated  pleural effusion.  Acute hypoxic respiratory failure with hypoxia secondary to pleural effusion. #2 left breast cancer.  Patient had a Pleurx catheter as an outpatient since February 2023, started to have decreased output. -Presented with fevers.  CCM consulted and underwent instillation of fibrinolytics on 7/24. -Chest x-ray; 7/26 complete opacification of the left side -Underwent second chest tube placement at higher position yielding 1.2 L hemorrhagic fluids on 7/26. -Chest x ray improved post second chest tube placement. Plan to proceed with CT chest to further evaluate.  Appreciate CCM management.  CT chest: Diminished volume of pleural fluid and on the peripheral and upper chest following placement of pigtail drainage catheter, a small locules of gas within the diminished left-sided pleural effusion, loculated fluid on the left heart border and volume loss in the left lower chest is similar.  Perhaps slightly improved aeration  in the upper chest. Culture from 7/23 pleural fluid; now growing Staph epidermidis. ID think this is contaminant.  ID consulted. Recommend 2 weeks treatment of antibiotics total. Last day of antibiotics 8/06. Antibiotics change to Augmentin. Vancomycin and cefepime discontinue.  Chest tube removed 7/28.  CCM will try another instillation of fibrinolytic therapy to pleurex catheter.  CCM discussed with IR residual fluid burden likely too small for removal and replacement of Pleurx catheter.  Third dose of tPA today, keep Pleurx to suction, repeat chest x-ray tomorrow.  CCM will consider talc pleurodesis and Pleurx removal prior to discharge versus ongoing intermittent drainage with Pleurx Chest x ray stable.   2-AKI on CKD 3 b:  -Baseline  creatinine 1.2-1.49 -Worsening renal function 7/26, creatinine peaked to  1.9 --Renal ultrasound: Negative for hydronephrosis.  Increased cortical echogenicity suggests medical renal disease.  There are a few bilateral renal  cysts. -Renal function improved today creatinine down to 1.3 -Improved with IV fluids. NSL fluids.   3-Primary malignant neoplasm of breast with metastasis Metastatic left breast cancer with malignant left pleural effusion and osseous involvement. Follow-up with Dr. Payton Mccallum On Letrozole and Versenio.  Holding due to active infection.   4-Diabetes type 2: Hemoglobin A1c 6.4 Continue to hold metformin while inpatient. Sliding scale insulin  Hypertension associated with diabetes: Continue with amlodipine, Coreg and hydralazine  Hyperlipidemia associated with type 2 diabetes: Continue with atorvastatin  Anemia of chronic disease:  -Hb trending down. anemia panel anemia of chronic diseases.  - If hb decreases further might need blood transfusion.   History of CVA: Continue with aspirin and statin  syncope: Likely vasovagal.  Orthostatic negative.  No significant arrhythmia on telemetry     Nutrition Problem: Increased nutrient needs Etiology: chronic illness (malignant pleural effusion)    Signs/Symptoms: estimated needs    Interventions: Ensure Enlive (each supplement provides 350kcal and 20 grams of protein), MVI, Liberalize Diet  Estimated body mass index is 26.33 kg/m as calculated from the following:   Height as of this encounter: '5\' 2"'$  (1.575 m).   Weight as of this encounter: 65.3 kg.   DVT prophylaxis: Heparin  Code Status: Full code Family Communication: Care discussed with daughter at bedside. 7/29 Disposition Plan:  Status is: Inpatient Remains inpatient appropriate because: management of loculated pleural effusion.     Consultants:  CCM  Procedures:  Chest tube placement 7/26  Antimicrobials:  Cefepime  Subjective: No new complaints. She is feeling better. Dyspnea improved.  Objective: Vitals:   02/25/22 2045 02/26/22 0450 02/26/22 0452 02/26/22 1408  BP: 133/66 (!) 149/58  (!) 138/54  Pulse: 79 79  80  Resp: '17 17  20  '$ Temp: 99.7 F  (37.6 C) 99.3 F (37.4 C)  97.8 F (36.6 C)  TempSrc: Oral Oral    SpO2: 98% 97%  99%  Weight:   65.3 kg   Height:        Intake/Output Summary (Last 24 hours) at 02/26/2022 1801 Last data filed at 02/26/2022 1254 Gross per 24 hour  Intake 610 ml  Output 420 ml  Net 190 ml    Filed Weights   02/23/22 0500 02/24/22 1653 02/26/22 0452  Weight: 51 kg 63.1 kg 65.3 kg    Examination:  General exam: NAD Respiratory system: BL air movement.  Cardiovascular system: S 1, S 2 RRR Gastrointestinal system: BS present, soft,. nt Central nervous system: Alert, follows command Extremities: No edema  Data Reviewed: I have personally reviewed following labs and imaging studies  CBC: Recent Labs  Lab 02/22/22 0434 02/23/22 0835 02/24/22 0723 02/25/22 0348 02/26/22 0400  WBC 5.6 4.7 3.8* 4.4 4.8  HGB 7.9* 7.9* 7.2* 7.0* 7.0*  HCT 23.9* 24.1* 21.5* 21.1* 21.4*  MCV 79.4* 79.0* 78.2* 77.0* 79.0*  PLT 271 296 287 307 876    Basic Metabolic Panel: Recent Labs  Lab 02/20/22 0524 02/21/22 0538 02/21/22 2001 02/22/22 0434 02/23/22 0835 02/24/22 0723 02/25/22 0348 02/26/22 0400  NA 138 137  --  136 138 139 139 139  K 4.4 4.8  --  4.5 4.4 4.0 4.6 4.1  CL 110 109  --  107 113* 112* 114* 113*  CO2 22 21*  --  22 19* 19* 20* 20*  GLUCOSE 153* 169*   < >  173* 216* 184* 183* 150*  BUN 28* 38*  --  32* 24* '20 17 12  '$ CREATININE 1.61* 1.95*  --  1.36* 1.11* 0.96 0.93 0.78  CALCIUM 9.1 9.1  --  8.5* 8.7* 8.4* 8.7* 8.8*  MG 2.4 2.7*  --   --   --   --   --   --    < > = values in this interval not displayed.    GFR: Estimated Creatinine Clearance: 53.1 mL/min (by C-G formula based on SCr of 0.78 mg/dL). Liver Function Tests: No results for input(s): "AST", "ALT", "ALKPHOS", "BILITOT", "PROT", "ALBUMIN" in the last 168 hours.  No results for input(s): "LIPASE", "AMYLASE" in the last 168 hours. No results for input(s): "AMMONIA" in the last 168 hours. Coagulation Profile: No  results for input(s): "INR", "PROTIME" in the last 168 hours. Cardiac Enzymes: No results for input(s): "CKTOTAL", "CKMB", "CKMBINDEX", "TROPONINI" in the last 168 hours. BNP (last 3 results) No results for input(s): "PROBNP" in the last 8760 hours. HbA1C: No results for input(s): "HGBA1C" in the last 72 hours. CBG: Recent Labs  Lab 02/25/22 1628 02/25/22 2042 02/26/22 0751 02/26/22 1106 02/26/22 1601  GLUCAP 150* 166* 156* 120* 146*    Lipid Profile: No results for input(s): "CHOL", "HDL", "LDLCALC", "TRIG", "CHOLHDL", "LDLDIRECT" in the last 72 hours. Thyroid Function Tests: No results for input(s): "TSH", "T4TOTAL", "FREET4", "T3FREE", "THYROIDAB" in the last 72 hours. Anemia Panel: Recent Labs    02/25/22 0348  VITAMINB12 1,237*  FOLATE 27.8  FERRITIN 1,931*  TIBC 144*  IRON 32  RETICCTPCT 1.2    Sepsis Labs: Recent Labs  Lab 02/20/22 0524 02/21/22 0538  PROCALCITON 0.26 0.51     Recent Results (from the past 240 hour(s))  Blood culture (routine x 2)     Status: None   Collection Time: 02/18/22  3:14 PM   Specimen: BLOOD  Result Value Ref Range Status   Specimen Description   Final    BLOOD LEFT ANTECUBITAL Performed at [facility], [place] [address] [place], [place] [address]    Special Requests   Final    BOTTLES DRAWN AEROBIC AND ANAEROBIC Blood Culture results may not be optimal due to an inadequate volume of blood received in culture bottles Performed at [place] [address], [place], [place] [address]    Culture   Final    NO GROWTH 5 DAYS Performed at [facility], [place] [address] [place], [place] [address]    Report Status 02/23/2022 FINAL  Final  Blood culture (routine x 2)     Status: None   Collection Time: 02/18/22  3:30 PM   Specimen: BLOOD  Result Value Ref Range Status   Specimen Description   Final    BLOOD LEFT ANTECUBITAL Performed at [place]  [address] [place], [place] [address]    Special Requests   Final    BLOOD Blood Culture adequate volume Performed at [place] [address] [place], [place] [address]    Culture   Final    NO GROWTH 5 DAYS Performed at [facility], [place] [address] [place], [place] [address]    Report Status 02/23/2022 FINAL  Final  SARS Coronavirus 2 by RT PCR (hospital order, performed in [facility] hospital lab) *cepheid single result test*     Status: None   Collection Time: 02/18/22  3:30 PM   Specimen: Nasal Swab  Result Value Ref Range Status   SARS Coronavirus 2 by RT PCR NEGATIVE NEGATIVE Final    Comment: (NOTE) SARS-CoV-2 target nucleic acids are NOT DETECTED.  The SARS-CoV-2 RNA is generally detectable in upper and lower respiratory specimens during the acute phase of infection. The lowest concentration of SARS-CoV-2 viral copies this assay can detect is 250 copies / mL. A negative result does not preclude SARS-CoV-2 infection and should not be used as the sole basis for treatment or other patient management decisions.  A negative result may occur with improper specimen collection / handling, submission of specimen other than nasopharyngeal swab, presence of viral mutation(s) within the areas targeted by this assay, and inadequate number of viral copies (<250 copies / mL). A negative result must be combined with clinical observations, patient history, and epidemiological information.  Fact Sheet for Patients:   https://www.patel.info/  Fact Sheet for Healthcare Providers: https://hall.com/  This test is not yet approved or  cleared by the [place] FDA and has been authorized for detection and/or diagnosis of SARS-CoV-2 by FDA under an Emergency Use Authorization (EUA).  This EUA will remain in effect (meaning this test can be used) for the duration of the COVID-19 declaration under Section 564(b)(1) of  the Act, 21 U.S.C. section 360bbb-3(b)(1), unless the authorization is terminated or revoked sooner.  Performed at [facility], [place] [address] [place], [place] [address]   Urine Culture     Status: None   Collection Time: 02/18/22  4:17 PM   Specimen: Urine, Clean Catch  Result Value Ref Range Status   Specimen Description   Final    URINE, CLEAN CATCH Performed at [facility], [place] [address], [place], [place] [address]    Special Requests   Final    NONE Performed at [facility], [place] [address], [place], [place] [address]    Culture   Final    NO GROWTH Performed at [facility], [place] [address] [place], [place] [address]    Report Status 02/19/2022 FINAL  Final  Culture, body fluid w Gram Stain-bottle     Status: None   Collection Time: 02/18/22  7:04 PM   Specimen: Fluid  Result Value Ref Range Status   Specimen Description FLUID PLEURAL  Final   Special Requests   Final    BOTTLES DRAWN AEROBIC AND ANAEROBIC Blood Culture adequate volume   Gram Stain   Final    GRAM POSITIVE COCCI IN TETRADS BOTTLES DRAWN AEROBIC ONLY CRITICAL RESULT CALLED TO, READ BACK BY AND VERIFIED WITH: RN Francie Massing [phone] '@1350'$  FH Performed at [facility], [place] [address] [place], [place] [address]    Culture MODERATE STAPHYLOCOCCUS EPIDERMIDIS  Final   Report Status 02/25/2022 FINAL  Final   Organism ID, Bacteria STAPHYLOCOCCUS EPIDERMIDIS  Final      Susceptibility   Staphylococcus epidermidis - MIC*    CIPROFLOXACIN >=8 RESISTANT Resistant     ERYTHROMYCIN >=8 RESISTANT Resistant     GENTAMICIN <=0.5 SENSITIVE Sensitive     OXACILLIN >=4 RESISTANT Resistant     TETRACYCLINE 2 SENSITIVE Sensitive     VANCOMYCIN 1 SENSITIVE Sensitive     TRIMETH/SULFA <=10 SENSITIVE Sensitive     CLINDAMYCIN >=8 RESISTANT Resistant     RIFAMPIN <=0.5 SENSITIVE Sensitive     Inducible Clindamycin NEGATIVE Sensitive     * MODERATE  STAPHYLOCOCCUS EPIDERMIDIS  Gram stain     Status: None  Collection Time: 02/18/22  7:04 PM   Specimen: Fluid  Result Value Ref Range Status   Specimen Description FLUID PLEURAL  Final   Special Requests NONE  Final   Gram Stain   Final    FEW WBC PRESENT,BOTH PMN AND MONONUCLEAR NO ORGANISMS SEEN Performed at [facility], [address], [place], [place] [address]    Report Status 02/19/2022 FINAL  Final  MRSA Next Gen by PCR, Nasal     Status: None   Collection Time: 02/19/22 12:32 PM   Specimen: Nasal Mucosa; Nasal Swab  Result Value Ref Range Status   MRSA by PCR Next Gen NOT DETECTED NOT DETECTED Final    Comment: (NOTE) The GeneXpert MRSA Assay (FDA approved for NASAL specimens only), is one component of a comprehensive MRSA colonization surveillance program. It is not intended to diagnose MRSA infection nor to guide or monitor treatment for MRSA infections. Test performance is not FDA approved in patients less than 105 years old. Performed at [facility], [place] [address] [place], [place] [address]   Body fluid culture w Gram Stain     Status: None (Preliminary result)   Collection Time: 02/22/22 11:38 AM   Specimen: Pleura; Body Fluid  Result Value Ref Range Status   Specimen Description PLEURAL  Final   Special Requests   Final    LEFT Performed at [facility], [address], [place], [place] [address]    Gram Stain PENDING  Incomplete   Culture PENDING  Incomplete   Report Status PENDING  Incomplete         Radiology Studies: ECHOCARDIOGRAM LIMITED  Result Date: 02/26/2022    ECHOCARDIOGRAM LIMITED REPORT   Patient Name:   DALIANA LEVERETT Date of Exam: 02/26/2022 Medical Rec #:  [id number]          Height:       62.0 in Accession #:    [id number]         Weight:       144.0 lb Date of Birth:  [birth year]          BSA:          1.662 m Patient Age:    70 years           BP:           138/54 mmHg Patient Gender: F                   HR:           77 bpm. Exam Location:  Inpatient Procedure: Limited Echo Indications:    Pericardial effusion  History:        Patient has prior history of Echocardiogram examinations, most                 recent 09/03/2021. Risk Factors:Diabetes and Hypertension.  Sonographer:    Jefferey Pica Referring Phys: [id number] Hortencia [place]i MEIER IMPRESSIONS  1. Trivial pericardial effusion. Moderate pleural effusion.  2. Left ventricular ejection fraction, by estimation, is 70 to 75%. The left ventricle has hyperdynamic function. There is moderate left ventricular hypertrophy.  3. Left atrial size was mildly dilated.  4. Moderate pleural effusion in the left lateral region.  5. The inferior vena cava is normal in size with greater than 50% respiratory variability, suggesting right atrial pressure of 3 mmHg. FINDINGS  Left Ventricle: Left ventricular ejection fraction, by estimation, is 70 to 75%. The left ventricle has  hyperdynamic function. There is moderate left ventricular hypertrophy. Left Atrium: Left atrial size was mildly dilated. Right Atrium: Right atrial size was normal in size. Pericardium: Trivial pericardial effusion is present. Aorta: The aortic root is normal in size and structure. Venous: The inferior vena cava is normal in size with greater than 50% respiratory variability, suggesting right atrial pressure of 3 mmHg. Additional Comments: Trivial pericardial effusion. Moderate pleural effusion. There is a moderate pleural effusion in the left lateral region. LEFT VENTRICLE PLAX 2D LVIDd:         3.60 cm LVIDs:         1.20 cm LV PW:         1.50 cm LV IVS:        1.60 cm  IVC IVC diam: 2.00 cm LEFT ATRIUM             Index        RIGHT ATRIUM           Index LA diam:        2.00 cm 1.20 cm/m   RA Area:     14.90 cm LA Vol (A2C):   54.8 ml 32.96 ml/m  RA Volume:   38.20 ml  22.98 ml/m LA Vol (A4C):   65.4 ml 39.34 ml/m LA Biplane Vol: 63.5 ml 38.20 ml/m   AORTA Ao Root diam: 3.20 cm Ao Asc diam:  3.00  cm Candee Furbish MD Electronically signed by Candee Furbish MD Signature Date/Time: 02/26/2022/5:08:13 PM    Final    DG CHEST PORT 1 VIEW  Result Date: 02/26/2022 CLINICAL DATA:  Pleural effusion. EXAM: PORTABLE CHEST 1 VIEW COMPARISON:  Chest radiograph and CT 02/22/2022 FINDINGS: The cardiac silhouette remains enlarged. Aortic atherosclerosis and a loop recorder remain in place. The left-sided pigtail pleural drain has been removed. A tunneled pleural catheter remains in place in the left lung base. There is an unchanged small left pleural effusion streaky left mid lung and patchy left basilar airspace opacities are unchanged. The right lung remains clear. No pneumothorax is identified. IMPRESSION: 1. Interval removal of left pigtail pleural drain. PleurX catheter remains in place with unchanged small left pleural effusion. No pneumothorax. 2. Unchanged left mid and lower lung opacities which may reflect atelectasis. Electronically Signed   By: Logan Bores M.D.   On: 02/26/2022 08:23        Scheduled Meds:  alteplase (CATHFLO ACTIVASE) 10 mg in sodium chloride (PF) 0.9 % 30 mL  10 mg Intrapleural Once   amLODipine  10 mg Oral Daily   amoxicillin-clavulanate  1 tablet Oral Q12H   aspirin EC  81 mg Oral Daily   atorvastatin  80 mg Oral QPM   carvedilol  25 mg Oral BID WC   feeding supplement  237 mL Oral BID BM   gabapentin  100 mg Oral TID   heparin  5,000 Units Subcutaneous Q8H   hydrALAZINE  50 mg Oral TID   insulin aspart  0-9 Units Subcutaneous TID WC   multivitamin with minerals  1 tablet Oral Daily   polyethylene glycol  17 g Oral Daily   sodium chloride flush  10 mL Intrapleural Q8H   sodium chloride flush  10 mL Intrapleural Q8H   sodium chloride flush  3 mL Intravenous Q12H   Continuous Infusions:     LOS: 8 days    Time spent: 35 minutes    Nayara Taplin A Chandon Lazcano, MD [facility]   If 7PM-7AM, please contact night-coverage  [email or web address]  02/26/2022, 6:01 PM

## 2022-02-26 NOTE — Progress Notes (Signed)
Physical Therapy Treatment Patient Details Name: Heather Rogers MRN: [id number] DOB: [birth year] Today's Date: 02/26/2022   History of Present Illness Pt 76 yo female admitted with malignant pleural effusion s/p 2 chest tubes and pleural fibrinolysis 7/24. Pt with one chest tube removed 7/28. Hx of pleurx cath placement 09/07/21, CVA, DM, met breast cancer, CKD    PT Comments    Pt is progressing well this date. Amb hallway distance, much improved activity tolerance. SpO2=99% on RA with activity.    Recommendations for follow up therapy are one component of a multi-disciplinary discharge planning process, led by the attending physician.  Recommendations may be updated based on patient status, additional functional criteria and insurance authorization.  Follow Up Recommendations  Home health PT     Assistance Recommended at Discharge Frequent or constant Supervision/Assistance  Patient can return home with the following A little help with bathing/dressing/bathroom;Assistance with cooking/housework;Assist for transportation;Help with stairs or ramp for entrance   Equipment Recommendations  Rolling walker (2 wheels)    Recommendations for Other Services       Precautions / Restrictions Precautions Precautions: Fall Precaution Comments: Chest tube; keep sats >92% Restrictions Weight Bearing Restrictions: No     Mobility  Bed Mobility Overal bed mobility: Modified Independent             General bed mobility comments: bed flat, no useof rails. incr time    Transfers Overall transfer level: Needs assistance Equipment used: Rolling walker (2 wheels) Transfers: Sit to/from Stand Sit to Stand: Supervision, Min guard           General transfer comment: cues for hand placement, to control descent; min/guard for safety    Ambulation/Gait Ambulation/Gait assistance: Supervision Gait Distance (Feet): 150 Feet Assistive device: Rolling walker (2 wheels) Gait  Pattern/deviations: Step-to pattern, Step-through pattern, Narrow base of support       General Gait Details: initial verbal cues for trunk extension and RW position. one brief standing rest mid distance with SpO2 = 99% on RA, 0/4 DOE; HR 85   Stairs             Wheelchair Mobility    Modified Rankin (Stroke Patients Only)       Balance     Sitting balance-Leahy Scale: Good     Standing balance support: No upper extremity supported, During functional activity, Bilateral upper extremity supported, Reliant on assistive device for balance Standing balance-Leahy Scale: Fair Standing balance comment: Could static stand but not accept any challenges without at least single UE support; steady with RW                            Cognition Arousal/Alertness: Awake/alert Behavior During Therapy: WFL for tasks assessed/performed Overall Cognitive Status: Within Functional Limits for tasks assessed                                          Exercises      General Comments        Pertinent Vitals/Pain Pain Assessment Pain Assessment: No/denies pain    Home Living                          Prior Function            PT Goals (current goals can now be found  in the care plan section) Acute Rehab PT Goals Patient Stated Goal: to return home; to regain PLOF/independence PT Goal Formulation: With patient/family Time For Goal Achievement: 03/06/22 Potential to Achieve Goals: Good Progress towards PT goals: Progressing toward goals    Frequency    Min 3X/week      PT Plan Current plan remains appropriate    Co-evaluation              AM-PAC PT "6 Clicks" Mobility   Outcome Measure  Help needed turning from your back to your side while in a flat bed without using bedrails?: None Help needed moving from lying on your back to sitting on the side of a flat bed without using bedrails?: None Help needed moving to and from  a bed to a chair (including a wheelchair)?: A Little Help needed standing up from a chair using your arms (e.g., wheelchair or bedside chair)?: A Little Help needed to walk in hospital room?: A Little Help needed climbing 3-5 steps with a railing? : A Little 6 Click Score: 20    End of Session Equipment Utilized During Treatment: Gait belt Activity Tolerance: Patient tolerated treatment well Patient left: in chair;with call bell/phone within reach;with chair alarm set;with family/visitor present Nurse Communication: Mobility status PT Visit Diagnosis: Unsteadiness on feet (R26.81);Muscle weakness (generalized) (M62.81);Difficulty in walking, not elsewhere classified (R26.2)     Time: 1010-1040 PT Time Calculation (min) (ACUTE ONLY): 30 min  Charges:  $Gait Training: 23-37 mins                     Heather Rogers, PT  Acute Rehab Dept [facility]) [phone]  [facility] Weekend Pager [facility] only)  [phone]  02/26/2022    [facility] 02/26/2022, 10:46 AM

## 2022-02-26 NOTE — Procedures (Signed)
Pleural Fibrinolytic Administration Procedure Note  Heather Rogers  [id number]  1945/10/25  Date:02/26/22  Time:2:26 PM   Provider Performing:Edd Reppert Jerilynn Mages Verlee Monte   Procedure: Pleural Fibrinolysis Subsequent day [phone])  Indication(s) Fibrinolysis of complicated pleural effusion  Consent Risks of the procedure as well as the alternatives and risks of each were explained to the patient and/or caregiver.  Consent for the procedure was obtained.   Anesthesia None   Time Out Verified patient identification, verified procedure, site/side was marked, verified correct patient position, special equipment/implants available, medications/allergies/relevant history reviewed, required imaging and test results available.   Sterile Technique Hand hygiene, gloves   Procedure Description Existing pleural catheter was cleaned and accessed in sterile manner.  '10mg'$  of tPA in 30cc of saline was injected into pleural space using existing pleural catheter.  Catheter will be clamped for 1 hour and then placed back to suction.   Complications/Tolerance None; patient tolerated the procedure well.  EBL None   Specimen(s) None

## 2022-02-26 NOTE — Progress Notes (Signed)
NAME:  Heather Rogers, MRN:  [id number], DOB:  1945/11/15, LOS: 8 ADMISSION DATE:  02/18/2022, CONSULTATION DATE:  7/24 REFERRING MD:  Dr. Manuella Ghazi, CHIEF COMPLAINT:  SOB   BRIEF  76 year old female with past medical history as below, which is significant for CKD 3, diabetes, hypertension, metastatic breast cancer for which she is currently undergoing treatment with [name] and letrozole.  She has Pleurx catheter on the left which is followed by Dr. Verlee Monte in the pulmonary clinic.  She was last seen in April and at that time was draining the Pleurx every other day.  Output decreased and she was able to drain about every 4 days.  Over the course of a couple of weeks her output has significantly increased to the point where her daughter was draining every 2 to 3 days with outputs around 2-300.  On 7/23 the patient developed fever and suffered a syncopal episode for which she presented to [facility] emergency department.  Upon arrival to the emergency department she was hypoxic requiring 2 L of supplemental oxygen and was febrile to 101F.  Work-up in the emergency department included CT angiogram of the chest demonstrating large pleural effusion on the left with areas concerning for loculation. Pleurx vacuum bottle attached in ED and only drained a small amount of fluid. PCCM consulted for Pleurx and effusion management.    Pertinent  Medical History   has a past medical history of Chronic kidney disease, stage 3b ([place]), Diabetes ([place]), Hypertension, Pneumonia due to COVID-19 virus (07/25/2020), Primary malignant neoplasm of breast with metastasis ([place]) (09/14/2021), Stroke (cerebrum) ([place]), and Stroke ([place]) (05/15/2018).   Significant Hospital Events: Including procedures, antibiotic start and stop dates in addition to other pertinent events   02/18/2022  - wrosening left effusionloculation v , concern for obstruction of baseline left pleurx -> s/p ip fibrinolytucs -f rist dose 7/23 fluid culture 7/23  - covid neg 7/23 MRSA neg 7/23 Blood c/s 7/25 - 3L -  97%. New o2 need.  Per daughter - till Sunday 02/18/22 pleurx was draining well. Same day fever too. Then "passed out" came to ER -> found to have worsening effusion and loculated. Now has reponded to first dose intrapleural fibrinolytic but CXR looks unchnaged. Daughter feels she is a bit more tired today. FEbrile 101/.70F yesteday  2nd DOSE IP LYITC ABG without hypercapnia - patient was sleepy 7/26- still on 3L [place]. No drainage after 2nd dose IP Lytic per RN - only 25cc ovrnight.  Pr daughter [name] - > patient did not turn per protocol and then went to sleep. But daugther also feels -> patient did drain well after 2nd dose. However,  Pleurx -> at bottom.  Still febrile. Eating BF. Oriented and stable per RN. WBC normal 2nd 10 F ches ttube at higher levvel by IR - > drained 1.2L hemorrhagic fluid straight away   7/28 IR chest tube removed   Interim History / Subjective:   Still felt dyspneic yesterday getting up and walking around but a bit better today. 60cc serous drainage from PleurX overnight. CXR essentially stable to smaller left effusion.  Objective   Blood pressure (!) 149/58, pulse 79, temperature 99.3 F (37.4 C), temperature source Oral, resp. rate 17, height '5\' 2"'$  (1.575 m), weight 65.3 kg, SpO2 97 %.        Intake/Output Summary (Last 24 hours) at 02/26/2022 1401 Last data filed at 02/26/2022 1254 Gross per 24 hour  Intake 1100 ml  Output 460 ml  Net  640 ml   Filed Weights   02/23/22 0500 02/24/22 1653 02/26/22 0452  Weight: 51 kg 63.1 kg 65.3 kg    Examination: Blood pressure (!) 143/62, pulse 81, temperature 99 F (37.2 C), temperature source Oral, resp. rate 18, height '5\' 2"'$  (1.575 m), weight 51 kg, SpO2 94 %. Gen:      No acute distress Neck:     No masses; no thyromegaly Lungs:    Clear to auscultation bilaterally; normal respiratory effort CV:         Regular rate and rhythm; no murmurs Abd:      + bowel  sounds; soft, non-tender; no palpable masses, no distension Ext:    No edema; adequate peripheral perfusion Skin:      Warm and dry; no rash Neuro: alert and oriented x 3   Culture 7/23 from PleurX with MRSE  CXR reviewed by me today with smaller left pleural effusion, PleurX still in place    Resolved Hospital Problem list     Assessment & Plan:   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  Pleurx placed in February 2023 Left loculated effusion of above - Present on Admit.  Still no significant output after tPA dwell in the Pleurx. Possible parapneumonic component vs empyema. Original culture with MRSE from PleurX and culture from chest tube is pending. - discussed with IR - residual fluid burden likely too small for removal and replacement, will give 3rd dose tpa today, unclamp this afternoon - keep PleurX to suction, repeat CXR tomorrow morning - will consider talc pleurodesis and PleurX removal prior to discharge vs ongoing intermittent drainage with PleurX - if leaves with PleurX will potentially need updated prescription for daily drainage for more bottles   Possible pericardial effusion - limited tte ordered   Best Practice (right click and "Reselect all SmartList Selections" daily)   Daughter update at bedside on 7/31  [place]   If no response to pager , please call [phone] until 7pm After 7:00 pm call Elink  [phone] 02/26/2022, 2:01 PM

## 2022-02-26 NOTE — Progress Notes (Signed)
Occupational Therapy Treatment Patient Details Name: Heather Rogers MRN: [id number] DOB: 1946-05-08 Today's Date: 02/26/2022   History of present illness Pt 76 yo female admitted with malignant pleural effusion s/p 2 chest tubes and pleural fibrinolysis 7/24. Pt with one chest tube removed 7/28. Hx of pleurx cath placement 09/07/21, CVA, DM, met breast cancer, CKD   OT comments  Pt supervision level for simulated selfcare tasks and transfers with use of the RW.  Feel she could be modified independent without chest drain and with use of the RW and appropriate DME over the toilet and in the shower.  Recommend HHOT at discharge for evaluation and ongoing treatment in familiar environment.  No further acute OT needs at this time.     Recommendations for follow up therapy are one component of a multi-disciplinary discharge planning process, led by the attending physician.  Recommendations may be updated based on patient status, additional functional criteria and insurance authorization.    Follow Up Recommendations  Home health OT    Assistance Recommended at Discharge PRN  Patient can return home with the following  Assistance with cooking/housework;Assist for transportation;Help with stairs or ramp for entrance   Equipment Recommendations  None recommended by OT       Precautions / Restrictions Precautions Precautions: Fall Precaution Comments: Chest tube; keep sats >92% Restrictions Weight Bearing Restrictions: No       Mobility Bed Mobility                    Transfers Overall transfer level: Needs assistance Equipment used: Rolling walker (2 wheels) Transfers: Sit to/from Stand, Bed to chair/wheelchair/BSC Sit to Stand: Supervision Stand pivot transfers: Supervision         General transfer comment: Min instructional cueing for hand placment with sit to stand.     Balance Overall balance assessment: Needs assistance         Standing balance support:  During functional activity, Reliant on assistive device for balance Standing balance-Leahy Scale: Fair Standing balance comment: Needs use of the RW for support with mobility                           ADL either performed or assessed with clinical judgement   ADL       Grooming: Supervision/safety;Standing                   Toilet Transfer: Sales executive;Ambulation;Rolling walker (2 wheels)       Tub/ Shower Transfer: Tub transfer;Supervision/safety;Shower seat;Ambulation Tub/Shower Transfer Details (indicate cue type and reason): simulated stepping over the edge of the tub Functional mobility during ADLs: Supervision/safety;Rollator (4 wheels) General ADL Comments: Discussed need for a shower seat at home, and family plans to purchase from [employer].  They report already having a 3:1 however, pt feels she can complete sit to stand from the toilet without much difficulty.               Cognition Arousal/Alertness: Awake/alert Behavior During Therapy: WFL for tasks assessed/performed Overall Cognitive Status: Within Functional Limits for tasks assessed                                                     Pertinent Vitals/ Pain       Pain Assessment Pain Assessment:  Faces Faces Pain Scale: No hurt            Progress Toward Goals  OT Goals(current goals can now be found in the care plan section)  Progress towards OT goals: Goals met and updated - see care plan     Plan All goals met and education completed, patient discharged from OT services;Discharge plan needs to be updated       AM-PAC OT "6 Clicks" Daily Activity     Outcome Measure   Help from another person eating meals?: None Help from another person taking care of personal grooming?: None Help from another person toileting, which includes using toliet, bedpan, or urinal?: None Help from another person bathing (including washing, rinsing, drying)?:  A Little Help from another person to put on and taking off regular upper body clothing?: None Help from another person to put on and taking off regular lower body clothing?: None 6 Click Score: 23    End of Session Equipment Utilized During Treatment: Rolling walker (2 wheels)  OT Visit Diagnosis: Unsteadiness on feet (R26.81);Muscle weakness (generalized) (M62.81)   Activity Tolerance Patient tolerated treatment well   Patient Left in chair;with call bell/phone within reach;with chair alarm set   Nurse Communication Mobility status        Time: 1950-9326 OT Time Calculation (min): 37 min  Charges: OT General Charges $OT Visit: 1 Visit OT Treatments $Self Care/Home Management : 23-37 mins  Renan Danese OTR/L 02/26/2022, 4:21 PM

## 2022-02-26 NOTE — Consult Note (Signed)
[facility]    Date of Admission:  02/18/2022     Reason for Consult: Pleural effusion     Referring Physician: Dr Tyrell Antonio  Current antibiotics: Cefepime Vancomycin  ASSESSMENT:    76 y.o. female admitted with:  Malignant left pleural effusion: s/p Pleurx catheter placement in Feb 2023 Loculated effusion of #1: present on admission and improved after 2nd chest tube placement by IR (now removed) with resultant adequate drainage of her prior Pleurx catheter.  Repeat CXR today with small effusion. Fevers and hypoxic respiratory failure: resolved and likely secondary to obstructed Pleurx Staph epi from pleural fluid: I think this is probably a contaminant. Acute kidney injury on CKD3: this has improved Metastatic breast cancer  RECOMMENDATIONS:    Will narrow antibiotics and plan to treat for 2 weeks total  End date of antibiotics = 03/04/22 Will stop vancomycin and cefepime Start Augmentin to finish antibiotics as noted above Pleurx management per CCM Will sign off, please call as needed   Principal Problem:   Malignant left pleural effusion Active Problems:   History of CVA   Syncope   Type 2 diabetes mellitus (A1c 6.4 on 2/5)    Hypertension associated with diabetes ([place])   Anemia of chronic disease   Primary malignant neoplasm of breast with metastasis (HCC)   Chronic kidney disease, stage 3b (HCC)   Hyperlipidemia associated with type 2 diabetes mellitus (HCC)   Acute respiratory failure with hypoxia (HCC)   MEDICATIONS:    Scheduled Meds:  amLODipine  10 mg Oral Daily   amoxicillin-clavulanate  1 tablet Oral Q12H   aspirin EC  81 mg Oral Daily   atorvastatin  80 mg Oral QPM   carvedilol  25 mg Oral BID WC   feeding supplement  237 mL Oral BID BM   gabapentin  100 mg Oral TID   heparin  5,000 Units Subcutaneous Q8H   hydrALAZINE  50 mg Oral TID   insulin aspart  0-9 Units Subcutaneous TID WC   multivitamin with minerals  1  tablet Oral Daily   polyethylene glycol  17 g Oral Daily   sodium chloride flush  10 mL Intrapleural Q8H   sodium chloride flush  3 mL Intravenous Q12H   Continuous Infusions:   PRN Meds:.acetaminophen **OR** acetaminophen, iohexol, ondansetron **OR** ondansetron (ZOFRAN) IV, senna-docusate  HPI:    Heather Rogers is a 76 y.o. female with PMHx significant for CKD3, DM, HTN, metastatic breast cancer currently on treatment with [name] and Letrozole, known malignant pleural effusion with Pleurx followed by pulmonary who presented 7 days ago after developing fever and a syncopal episode.  Upon arrival to [facility] ED she was febrile and hypoxic requiring 2 liters of oxygen.  CTA chest showed a large pleural effusion on the left with areas of loculation.  Her Pleurx vacuum bottle attached in the ED and only drained a small amount of fluid.  Per CCM notes, patients Pleurx output had decreased and she was able to drain about every 4 days but over the couple weeks prior to admission her output had increased to every 2 to 3 days then subsequently decreased.  Given lack of drainage on admission it was felt this could be a mechanical blockage vs loculation of pleural effusion.  She was given lytics as well as antibiotics given her fevers and suspected pneumonia. Her pleural fluid was sent for cultures that grew Staph epi. Procalcitonin was only 0.23.  She responded with drainage  after her 1st dose of lytics but did not have any drainage really after 2nd IP lytics and continued to have fevers.  A repeat CXR on 7/26 showed complete opacification of the left side.  IR was asked to place a 2nd pigtail catheter. This was done on 02/21/22 by IR and drained 1.2 liters of exudative hemorrhagic fluid.  Cell count showed 75 WBC, 94% PMN.  Cultures pending.  Cytology pending.  The following day she was 96% on room air and CXR improved.   CT chest 7/27 showed:  IMPRESSION: 1. Diminished volume of pleural fluid along the  peripheral and upper chest following placement of a pigtail drainage catheter into a malignant effusion. Marker for sideholes at the edge of the pleural space, close attention on follow-up is suggested. 2. Small locules of gas within the diminished LEFT-sided pleural effusion, not unexpected following manipulation of PleurX catheter and placement of a LEFT pleural drain. 3. Loculated fluid along the LEFT heart border and volume loss in the LEFT lower chest is similar, perhaps slightly improved aeration in the upper chest compared to previous imaging. 4. Otherwise stable appearance of oncologic findings in the chest in the short interval  Her Chest tube was then removed on 7/28 by IR.  Subsequently started to have drainage from her Pleurx cahteter with 340cc output on 7/29.  Repeat CXR today:   IMPRESSION: 1. Interval removal of left pigtail pleural drain. PleurX catheter remains in place with unchanged small left pleural effusion. No pneumothorax. 2. Unchanged left mid and lower lung opacities which may reflect atelectasis.  She has now been afebrile since 02/21/22 with no evidence of leukocytosis and is stable on room air.  Her Pleurx drainage is recorded as 60cc over the past 24 hrs.    Past Medical History:  Diagnosis Date   Chronic kidney disease, stage 3b ([place])    Diabetes ([place])    Hypertension    Pneumonia due to COVID-19 virus 07/25/2020   Primary malignant neoplasm of breast with metastasis ([place]) 09/14/2021   Stroke (cerebrum) ([place])    Stroke ([place]) 05/15/2018    Social History   Tobacco Use   Smoking status: Former    Packs/day: 1.00    Years: 15.00    Total pack years: 15.00    Types: Cigarettes    Quit date: 07/30/1985    Years since quitting: 36.6   Smokeless tobacco: Never  Vaping Use   Vaping Use: Never used  Substance Use Topics   Alcohol use: Never   Drug use: Never    Family History  Problem Relation Age of Onset   Hypertension Mother    Stroke  Mother    Hypertension Father     Allergies  Allergen Reactions   Sulfa Antibiotics     Itching     Review of Systems  All other systems reviewed and are negative. Except as noted above.   OBJECTIVE:   Blood pressure (!) 149/58, pulse 79, temperature 99.3 F (37.4 C), temperature source Oral, resp. rate 17, height '5\' 2"'$  (1.575 m), weight 65.3 kg, SpO2 97 %. Body mass index is 26.33 kg/m.  Physical Exam Constitutional:      General: She is not in acute distress.    Comments: She is seen ambulating the hall way with therapy.    HENT:     Head: Normocephalic and atraumatic.  Eyes:     Extraocular Movements: Extraocular movements intact.     Conjunctiva/sclera: Conjunctivae normal.  Pulmonary:  Effort: Pulmonary effort is normal. No respiratory distress.     Comments: Pleurx catheter in place.  Musculoskeletal:        General: Normal range of motion.     Cervical back: Normal range of motion and neck supple.     Right lower leg: No edema.     Left lower leg: No edema.  Skin:    General: Skin is warm and dry.     Findings: No rash.  Neurological:     General: No focal deficit present.  Psychiatric:        Mood and Affect: Mood normal.        Behavior: Behavior normal.      Lab Results: Lab Results  Component Value Date   WBC 4.8 02/26/2022   HGB 7.0 (L) 02/26/2022   HCT 21.4 (L) 02/26/2022   MCV 79.0 (L) 02/26/2022   PLT 325 02/26/2022    Lab Results  Component Value Date   NA 139 02/26/2022   K 4.1 02/26/2022   CO2 20 (L) 02/26/2022   GLUCOSE 150 (H) 02/26/2022   BUN 12 02/26/2022   CREATININE 0.78 02/26/2022   CALCIUM 8.8 (L) 02/26/2022   GFRNONAA >60 02/26/2022   GFRAA 58 (L) 08/17/2020    Lab Results  Component Value Date   ALT 15 02/18/2022   AST 18 02/18/2022   ALKPHOS 50 02/18/2022   BILITOT 0.3 02/18/2022       Component Value Date/Time   CRP 1.5 (H) 07/27/2020 0151    No results found for: "ESRSEDRATE"  I have reviewed the  micro and lab results in Epic.  Imaging: DG CHEST PORT 1 VIEW  Result Date: 02/26/2022 CLINICAL DATA:  Pleural effusion. EXAM: PORTABLE CHEST 1 VIEW COMPARISON:  Chest radiograph and CT 02/22/2022 FINDINGS: The cardiac silhouette remains enlarged. Aortic atherosclerosis and a loop recorder remain in place. The left-sided pigtail pleural drain has been removed. A tunneled pleural catheter remains in place in the left lung base. There is an unchanged small left pleural effusion streaky left mid lung and patchy left basilar airspace opacities are unchanged. The right lung remains clear. No pneumothorax is identified. IMPRESSION: 1. Interval removal of left pigtail pleural drain. PleurX catheter remains in place with unchanged small left pleural effusion. No pneumothorax. 2. Unchanged left mid and lower lung opacities which may reflect atelectasis. Electronically Signed   By: Logan Bores M.D.   On: 02/26/2022 08:23     Imaging independently reviewed in Epic.  [facility] for Infectious [facility] [phone] pager 02/26/2022, 11:28 AM

## 2022-02-27 ENCOUNTER — Inpatient Hospital Stay (HOSPITAL_COMMUNITY): Payer: Medicare HMO

## 2022-02-27 DIAGNOSIS — J910 Malignant pleural effusion: Secondary | ICD-10-CM | POA: Diagnosis not present

## 2022-02-27 DIAGNOSIS — C7981 Secondary malignant neoplasm of breast: Secondary | ICD-10-CM | POA: Diagnosis not present

## 2022-02-27 LAB — BASIC METABOLIC PANEL
Anion gap: 6 (ref 5–15)
BUN: 17 mg/dL (ref 8–23)
CO2: 21 mmol/L — ABNORMAL LOW (ref 22–32)
Calcium: 8.5 mg/dL — ABNORMAL LOW (ref 8.9–10.3)
Chloride: 110 mmol/L (ref 98–111)
Creatinine, Ser: 1.03 mg/dL — ABNORMAL HIGH (ref 0.44–1.00)
GFR, Estimated: 56 mL/min — ABNORMAL LOW (ref >60)
Glucose, Bld: 156 mg/dL — ABNORMAL HIGH (ref 70–99)
Potassium: 4.3 mmol/L (ref 3.5–5.1)
Sodium: 137 mmol/L (ref 135–145)

## 2022-02-27 LAB — CBC
HCT: 18.9 % — ABNORMAL LOW (ref 36.0–46.0)
Hemoglobin: 6.1 g/dL — CL (ref 12.0–15.0)
MCH: 25.6 pg — ABNORMAL LOW (ref 26.0–34.0)
MCHC: 32.3 g/dL (ref 30.0–36.0)
MCV: 79.4 fL — ABNORMAL LOW (ref 80.0–100.0)
Platelets: 353 10*3/uL (ref 150–400)
RBC: 2.38 MIL/uL — ABNORMAL LOW (ref 3.87–5.11)
RDW: 14.3 % (ref 11.5–15.5)
WBC: 6.1 10*3/uL (ref 4.0–10.5)
nRBC: 0.5 % — ABNORMAL HIGH (ref 0.0–0.2)

## 2022-02-27 LAB — HEMOGLOBIN AND HEMATOCRIT, BLOOD
HCT: 32.9 % — ABNORMAL LOW (ref 36.0–46.0)
Hemoglobin: 11.1 g/dL — ABNORMAL LOW (ref 12.0–15.0)

## 2022-02-27 LAB — GLUCOSE, CAPILLARY
Glucose-Capillary: 141 mg/dL — ABNORMAL HIGH (ref 70–99)
Glucose-Capillary: 202 mg/dL — ABNORMAL HIGH (ref 70–99)
Glucose-Capillary: 172 mg/dL — ABNORMAL HIGH (ref 70–99)
Glucose-Capillary: 168 mg/dL — ABNORMAL HIGH (ref 70–99)

## 2022-02-27 LAB — PREPARE RBC (CROSSMATCH)

## 2022-02-27 MED ORDER — SODIUM CHLORIDE 0.9% IV SOLUTION: Freq: Once | INTRAVENOUS | Status: AC

## 2022-02-27 MED ORDER — FUROSEMIDE 10 MG/ML IJ SOLN
20.0000 mg | Freq: Once | INTRAMUSCULAR | Status: AC
Start: 2022-02-27 — End: 2022-02-27
  Administered 2022-02-27: 20 mg via INTRAVENOUS
  Filled 2022-02-27: qty 2

## 2022-02-27 NOTE — Progress Notes (Signed)
Nutrition Follow-up  DOCUMENTATION CODES:   Not applicable  INTERVENTION:  - continue Ensure BID.   NUTRITION DIAGNOSIS:   Increased nutrient needs related to chronic illness (malignant pleural effusion) as evidenced by estimated needs. -ongoing  GOAL:   Patient will meet greater than or equal to 90% of their needs -met on average  MONITOR:   PO intake, Supplement acceptance, Labs, Weight trends, I & O's  ASSESSMENT:   76 y.o. female with medical history significant for metastatic left breast cancer (on active treatment with [name] and letrozole) with malignant left pleural effusion s/p Pleurx catheter placement 09/07/2021, history of CVA, CKD stage IIIb, anemia of chronic disease, T2DM, HTN, HLD who is admitted with enlarging left malignant pleural effusion with concern for superimposed pneumonia.  She has mainly been eating 50-10% at meals since lunch on 7/27 to include 100% of breakfast today and 50% of lunch today.   She has been accepting Ensure nearly 100% of the time offered though there was a comment from yesterday morning that several bottles of the supplement were in her room.   Weight has been fluctuating nearly daily since admission. No information documented in the edema section of flow sheet.    Labs reviewed; CBGs: 141 and 202 mg/dl, creatining: 1.03 mg/dl, Ca: 8.5 mg/dl, GFR: 56 ml/min.  Medications reviewed; sliding scale novolog, 1 tablet multivitamin with minerals/day, 17 g miralax/day.   Diet Order:   Diet Order             Diet Carb Modified Fluid consistency: Thin; Room service appropriate? Yes  Diet effective now                   EDUCATION NEEDS:   No education needs have been identified at this time  Skin:  Skin Assessment: Reviewed RN Assessment  Last BM:  7/30 (type 4 x1, large amount)  Height:   Ht Readings from Last 1 Encounters:  02/18/22 _0  (1.575 m)    Weight:   Wt Readings from Last 1 Encounters:  02/27/22 60.6 kg      BMI:  Body mass index is 24.45 kg/m.  Estimated Nutritional Needs:  Kcal:  1900-2100 Protein:  95-110g Fluid:  2L/day     Jarome Matin, MS, RD, LDN, CNSC Registered Dietitian II Inpatient Clinical Nutrition RD pager # and on-call/weekend pager # available in [place]

## 2022-02-27 NOTE — Progress Notes (Signed)
PT Cancellation Note  Patient Details Name: Heather Rogers MRN: [id number] DOB: [birth year]   Cancelled Treatment:     HgB 6.1    pt scheduled to receive blood.  Pt has been evaluated with rec for HH PT.  Pt did amb 150 yesterday. Will continue to follow and see another day as schedule permits.   Rica Koyanagi  PTA Acute  Rehabilitation Services Office M-F          [phone] Weekend pager [phone]

## 2022-02-27 NOTE — Progress Notes (Signed)
PleurX catheter disconnected from pleur-evac as ordered. Catheter capped and dressing applied.

## 2022-02-27 NOTE — Care Management Important Message (Signed)
Important Message  Patient Details IM Letter given to the Patient. Name: Heather Rogers MRN: [id number] Date of Birth: Jun 28, 1946   Medicare Important Message Given:  Yes     Kerin Salen 02/27/2022, 10:37 AM

## 2022-02-27 NOTE — Progress Notes (Signed)
NAME:  Heather Rogers, MRN:  [id number], DOB:  Apr 30, 1946, LOS: 9 ADMISSION DATE:  02/18/2022, CONSULTATION DATE:  7/24 REFERRING MD:  Dr. Manuella Ghazi, CHIEF COMPLAINT:  SOB   BRIEF  76 year old female with past medical history as below, which is significant for CKD 3, diabetes, hypertension, metastatic breast cancer for which she is currently undergoing treatment with [name] and letrozole.  She has Pleurx catheter on the left which is followed by Dr. Verlee Monte in the pulmonary clinic.  She was last seen in April and at that time was draining the Pleurx every other day.  Output decreased and she was able to drain about every 4 days.  Over the course of a couple of weeks her output has significantly increased to the point where her daughter was draining every 2 to 3 days with outputs around 2-300.  On 7/23 the patient developed fever and suffered a syncopal episode for which she presented to [facility] emergency department.  Upon arrival to the emergency department she was hypoxic requiring 2 L of supplemental oxygen and was febrile to 101F.  Work-up in the emergency department included CT angiogram of the chest demonstrating large pleural effusion on the left with areas concerning for loculation. Pleurx vacuum bottle attached in ED and only drained a small amount of fluid. PCCM consulted for Pleurx and effusion management.    Pertinent  Medical History   has a past medical history of Chronic kidney disease, stage 3b ([place]), Diabetes ([place]), Hypertension, Pneumonia due to COVID-19 virus (07/25/2020), Primary malignant neoplasm of breast with metastasis ([place]) (09/14/2021), Stroke (cerebrum) ([place]), and Stroke ([place]) (05/15/2018).   Significant Hospital Events: Including procedures, antibiotic start and stop dates in addition to other pertinent events   02/18/2022  - wrosening left effusionloculation v , concern for obstruction of baseline left pleurx -> s/p ip fibrinolytucs -f rist dose 7/23 fluid culture 7/23  - covid neg 7/23 MRSA neg 7/23 Blood c/s 7/25 - 3L -  97%. New o2 need.  Per daughter - till Sunday 02/18/22 pleurx was draining well. Same day fever too. Then "passed out" came to ER -> found to have worsening effusion and loculated. Now has reponded to first dose intrapleural fibrinolytic but CXR looks unchnaged. Daughter feels she is a bit more tired today. FEbrile 101/.70F yesteday  2nd DOSE IP LYITC ABG without hypercapnia - patient was sleepy 7/26- still on 3L [place]. No drainage after 2nd dose IP Lytic per RN - only 25cc ovrnight.  Pr daughter [name] - > patient did not turn per protocol and then went to sleep. But daugther also feels -> patient did drain well after 2nd dose. However,  Pleurx -> at bottom.  Still febrile. Eating BF. Oriented and stable per RN. WBC normal 2nd 10 F ches ttube at higher levvel by IR - > drained 1.2L hemorrhagic fluid straight away   7/28 IR chest tube removed   Interim History / Subjective:   300cc drainage overnight. CXR stable.  Objective   Blood pressure (!) 109/50, pulse 74, temperature 99.2 F (37.3 C), temperature source Oral, resp. rate 18, height '5\' 2"'$  (1.575 m), weight 60.6 kg, SpO2 95 %.        Intake/Output Summary (Last 24 hours) at 02/27/2022 1236 Last data filed at 02/27/2022 1112 Gross per 24 hour  Intake 857.5 ml  Output 335 ml  Net 522.5 ml   Filed Weights   02/24/22 1653 02/26/22 0452 02/27/22 0636  Weight: 63.1 kg 65.3 kg 60.6 kg  Examination: Blood pressure (!) 143/62, pulse 81, temperature 99 F (37.2 C), temperature source Oral, resp. rate 18, height '5\' 2"'$  (1.575 m), weight 51 kg, SpO2 94 %. Gen:      No acute distress Neck:     No masses; no thyromegaly Lungs:    Clear to auscultation bilaterally; normal respiratory effort CV:         Regular rate and rhythm; no murmurs Abd:      + bowel sounds; soft, non-tender; no palpable masses, no distension Ext:    No edema; adequate peripheral perfusion Skin:      Warm and dry; no  rash Neuro: alert and oriented x 3  Bedside US with moderate anterior predominant pleural effusion  Culture 7/23 from PleurX with MRSE  CXR reviewed by me today with stable left pleural effusion, PleurX still in place    Resolved Hospital Problem list     Assessment & Plan:   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  Pleurx placed in February 2023 Left loculated effusion of above - Present on Admit.  Possible parapneumonic component vs empyema. Original culture with MRSE from PleurX and culture from chest tube is pending. Unfortunately on bedside US there's still too much residual fluid after lytics for talc pleurodesis to be effective and I'm not convinced current PleurX will have much utility going forward - disconnect PleurX from suction, cap and dress PleurX - trial bottle drainage tomorrow, if fails to drain much then consideration of removal and replacement later when there's sufficient reaccumulation for safe pocket - continue augmentin per ID, end date 8/6   Best Practice (right click and "Reselect all SmartList Selections" daily)   Daughter update at bedside on 8/1  [place]   If no response to pager , please call [phone] until 7pm After 7:00 pm call Elink  [phone] 02/27/2022, 12:36 PM

## 2022-02-27 NOTE — H&P (View-Only) (Signed)
NAME:  Heather Rogers, MRN:  [id number], DOB:  [birth year], LOS: 9 ADMISSION DATE:  02/18/2022, CONSULTATION DATE:  7/24 REFERRING MD:  Dr. Manuella Ghazi, CHIEF COMPLAINT:  SOB   BRIEF  76 year old female with past medical history as below, which is significant for CKD 3, diabetes, hypertension, metastatic breast cancer for which she is currently undergoing treatment with [name] and letrozole.  She has Pleurx catheter on the left which is followed by Dr. Verlee Monte in the pulmonary clinic.  She was last seen in April and at that time was draining the Pleurx every other day.  Output decreased and she was able to drain about every 4 days.  Over the course of a couple of weeks her output has significantly increased to the point where her daughter was draining every 2 to 3 days with outputs around 2-300.  On 7/23 the patient developed fever and suffered a syncopal episode for which she presented to [facility] emergency department.  Upon arrival to the emergency department she was hypoxic requiring 2 L of supplemental oxygen and was febrile to 101F.  Work-up in the emergency department included CT angiogram of the chest demonstrating large pleural effusion on the left with areas concerning for loculation. Pleurx vacuum bottle attached in ED and only drained a small amount of fluid. PCCM consulted for Pleurx and effusion management.    Pertinent  Medical History   has a past medical history of Chronic kidney disease, stage 3b ([place]), Diabetes ([place]), Hypertension, Pneumonia due to COVID-19 virus (07/25/2020), Primary malignant neoplasm of breast with metastasis ([place]) (09/14/2021), Stroke (cerebrum) ([place]), and Stroke ([place]) (05/15/2018).   Significant Hospital Events: Including procedures, antibiotic start and stop dates in addition to other pertinent events   02/18/2022  - wrosening left effusionloculation v , concern for obstruction of baseline left pleurx -> s/p ip fibrinolytucs -f rist dose 7/23 fluid culture 7/23  - covid neg 7/23 MRSA neg 7/23 Blood c/s 7/25 - 3L -  97%. New o2 need.  Per daughter - till Sunday 02/18/22 pleurx was draining well. Same day fever too. Then "passed out" came to ER -> found to have worsening effusion and loculated. Now has reponded to first dose intrapleural fibrinolytic but CXR looks unchnaged. Daughter feels she is a bit more tired today. FEbrile 101/.7F yesteday  2nd DOSE IP LYITC ABG without hypercapnia - patient was sleepy 7/26- still on 3L [place]. No drainage after 2nd dose IP Lytic per RN - only 25cc ovrnight.  Pr daughter [name] - > patient did not turn per protocol and then went to sleep. But daugther also feels -> patient did drain well after 2nd dose. However,  Pleurx -> at bottom.  Still febrile. Eating BF. Oriented and stable per RN. WBC normal 2nd 10 F ches ttube at higher levvel by IR - > drained 1.2L hemorrhagic fluid straight away   7/28 IR chest tube removed   Interim History / Subjective:   300cc drainage overnight. CXR stable.  Objective   Blood pressure (!) 109/50, pulse 74, temperature 99.2 F (37.3 C), temperature source Oral, resp. rate 18, height '5\' 2"'$  (1.575 m), weight 60.6 kg, SpO2 95 %.        Intake/Output Summary (Last 24 hours) at 02/27/2022 1236 Last data filed at 02/27/2022 1112 Gross per 24 hour  Intake 857.5 ml  Output 335 ml  Net 522.5 ml   Filed Weights   02/24/22 1653 02/26/22 0452 02/27/22 0636  Weight: 63.1 kg 65.3 kg 60.6 kg  Examination: Blood pressure (!) 143/62, pulse 81, temperature 99 F (37.2 C), temperature source Oral, resp. rate 18, height '5\' 2"'$  (1.575 m), weight 51 kg, SpO2 94 %. Gen:      No acute distress Neck:     No masses; no thyromegaly Lungs:    Clear to auscultation bilaterally; normal respiratory effort CV:         Regular rate and rhythm; no murmurs Abd:      + bowel sounds; soft, non-tender; no palpable masses, no distension Ext:    No edema; adequate peripheral perfusion Skin:      Warm and dry; no  rash Neuro: alert and oriented x 3  Bedside US with moderate anterior predominant pleural effusion  Culture 7/23 from PleurX with MRSE  CXR reviewed by me today with stable left pleural effusion, PleurX still in place    Resolved Hospital Problem list     Assessment & Plan:   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  Pleurx placed in February 2023 Left loculated effusion of above - Present on Admit.  Possible parapneumonic component vs empyema. Original culture with MRSE from PleurX and culture from chest tube is pending. Unfortunately on bedside US there's still too much residual fluid after lytics for talc pleurodesis to be effective and I'm not convinced current PleurX will have much utility going forward - disconnect PleurX from suction, cap and dress PleurX - trial bottle drainage tomorrow, if fails to drain much then consideration of removal and replacement later when there's sufficient reaccumulation for safe pocket - continue augmentin per ID, end date 8/6   Best Practice (right click and "Reselect all SmartList Selections" daily)   Daughter update at bedside on 8/1  [place]   If no response to pager , please call [phone] until 7pm After 7:00 pm call Elink  [phone] 02/27/2022, 12:36 PM

## 2022-02-27 NOTE — Progress Notes (Signed)
PROGRESS NOTE    Heather Rogers  NOM:[id number] DOB: [birth year] DOA: 02/18/2022 PCP: Heather Rakes, MD   Brief Narrative: 76 year old with past medical history significant for metastatic left breast cancer (on active treatment with [name] and letrozole), with malignant left pleural effusion post Pleurx catheter placement on 07/07/2022, history of CVA, CKD stage IIIb, anemia of chronic disease, diabetes type 2, hypertension, hyperlipidemia presented with fevers and dyspnea.  She also had a syncope episode and reported increased output from her Pleurx catheter.  Patient was noted to have fever of 101.  CT head was negative.  CTA chest showed a large left pleural effusion with atelectasis and new left breast mass with lymphadenopathy.  CT spine, CT abdomen and pelvis was negative.  Patient was a started on IV vancomycin and cefepime.  Subsequently vancomycin was discontinued.  CCM was consulted and patient underwent instillation of fibrinolytics on 7/24.  Chest x-ray repeated 7/26 showed complete opacification of the left lung.  IR consulted for second  left side chest tube placement, at higher position, placed on 7/26.  Hospital course prolong because patient has required thrombolytics instillation through Pleurx catheter to try to help with left side  loculated pleural effusion. She subsequently require second chest tube placed at a higher position on the left side.   Anemia, of malignancy. No evidence of active bleeding. She will received 2 units PRBC /8/01.     Assessment & Plan:   Principal Problem:   Malignant left pleural effusion Active Problems:   Syncope   Acute respiratory failure with hypoxia (HCC)   Primary malignant neoplasm of breast with metastasis (HCC)   Chronic kidney disease, stage 3b (HCC)   Type 2 diabetes mellitus (A1c 6.4 on 2/5)    Hypertension associated with diabetes ([place])   History of CVA   Anemia of chronic disease   Hyperlipidemia associated with type 2  diabetes mellitus (HCC)   1-Malignant Left Pleural Effusion:  Left side Complicated, loculated pleural effusion.  Acute hypoxic respiratory failure with hypoxia secondary to pleural effusion. #2 left breast cancer.  Patient had a Pleurx catheter as an outpatient since February 2023, started to have decreased output. -Presented with fevers.  CCM consulted and underwent instillation of fibrinolytics on 7/24. -Chest x-ray; 7/26 complete opacification of the left side -Underwent second chest tube placement at higher position yielding 1.2 L hemorrhagic fluids on 7/26. -Chest x ray improved post second chest tube placement. Plan to proceed with CT chest to further evaluate.  -Appreciate CCM management.  -CT chest: Diminished volume of pleural fluid and on the peripheral and upper chest following placement of pigtail drainage catheter, a small locules of gas within the diminished left-sided pleural effusion, loculated fluid on the left heart border and volume loss in the left lower chest is similar.  Perhaps slightly improved aeration  in the upper chest. -Culture from 7/23 pleural fluid; grew  Staph epidermidis. ID think this is contaminant.  -ID consulted. Recommend 2 weeks treatment of antibiotics total. Last day of antibiotics 8/06. -Antibiotics change to Augmentin. Vancomycin and cefepime discontinue.  -One Chest tube removed 7/28.  -CCM - try -3rd instillation of fibrinolytic therapy to pleurex catheter.  CCM discussed with IR residual fluid burden likely too small for removal and replacement of Pleurx catheter. Repeated  chest x-ray today is stable. -Plan to disconnect Pleurx from suction, cap, dress Pleurx.  Trial of pleural drainage tomorrow, if fails to drain then consideration of removal and replacement later when there is  sufficient re accumulation for safe pocket.    2-AKI on CKD 3 b:  -Baseline  creatinine 1.2-1.49 - 7/26, creatinine peaked to 1.9 --Renal ultrasound: Negative for  hydronephrosis.  Increased cortical echogenicity suggests medical renal disease.  There are a few bilateral renal cysts. -Renal function improved today creatinine down to 1.3 -Improved with IV fluids. NSL fluids.   3-Primary malignant neoplasm of breast with metastasis Metastatic left breast cancer with malignant left pleural effusion and osseous involvement. Follow-up with Heather Rogers On Letrozole and Versenio.  Holding due to active infection.   4-Diabetes type 2: Hemoglobin A1c 6.4 Continue to hold metformin while inpatient. Sliding scale insulin  Hypertension associated with diabetes: Continue with amlodipine, Coreg and hydralazine  Hyperlipidemia associated with type 2 diabetes: Continue with atorvastatin  Anemia of chronic disease:  -Hb trending down. anemia panel anemia of chronic diseases.  - Hb down to 6. She is receiving  2 units PRBC./8/01  History of CVA: Continue with aspirin and statin  Syncope: Likely vasovagal.  Orthostatic negative.  No significant arrhythmia on telemetry     Nutrition Problem: Increased nutrient needs Etiology: chronic illness (malignant pleural effusion)    Signs/Symptoms: estimated needs    Interventions: Ensure Enlive (each supplement provides 350kcal and 20 grams of protein), MVI, Liberalize Diet  Estimated body mass index is 24.45 kg/m as calculated from the following:   Height as of this encounter: '5\' 2"'$  (1.575 m).   Weight as of this encounter: 60.6 kg.   DVT prophylaxis: Heparin  Code Status: Full code Family Communication: Care discussed with daughter at bedside. 8/01 Disposition Plan:  Status is: Inpatient Remains inpatient appropriate because: management of loculated pleural effusion.     Consultants:  CCM  Procedures:  Chest tube placement 7/26  Antimicrobials:  Cefepime  Subjective: She was not feeling well last night, think because hb was low.  She is feeling better after getting first unit of blood.   Breathing ok.     Objective: Vitals:   02/27/22 0800 02/27/22 0825 02/27/22 1112 02/27/22 1155  BP: (!) 126/51 135/66 (!) 106/49 (!) 109/50  Pulse: 81 82 76 74  Resp: '18 18 18 18  '$ Temp: 99.4 F (37.4 C) 99 F (37.2 C) 99 F (37.2 C) 99.2 F (37.3 C)  TempSrc: Oral Oral Oral Oral  SpO2: 97% 98% 95% 95%  Weight:      Height:        Intake/Output Summary (Last 24 hours) at 02/27/2022 1308 Last data filed at 02/27/2022 1112 Gross per 24 hour  Intake 617.5 ml  Output 335 ml  Net 282.5 ml    Filed Weights   02/24/22 1653 02/26/22 0452 02/27/22 0636  Weight: 63.1 kg 65.3 kg 60.6 kg    Examination:  General exam: NAD Respiratory system: BL air movement. Pleurex in place.  Cardiovascular system: S 1, S 2 RRR Gastrointestinal system: BS present, soft, nt Central nervous system: Alert, follows command.  Extremities: No edema  Data Reviewed: I have personally reviewed following labs and imaging studies  CBC: Recent Labs  Lab 02/23/22 0835 02/24/22 0723 02/25/22 0348 02/26/22 0400 02/27/22 0327  WBC 4.7 3.8* 4.4 4.8 6.1  HGB 7.9* 7.2* 7.0* 7.0* 6.1*  HCT 24.1* 21.5* 21.1* 21.4* 18.9*  MCV 79.0* 78.2* 77.0* 79.0* 79.4*  PLT 296 287 307 325 382    Basic Metabolic Panel: Recent Labs  Lab 02/21/22 0538 02/21/22 2001 02/23/22 0835 02/24/22 0723 02/25/22 0348 02/26/22 0400 02/27/22 0327  NA 137   < >  138 139 139 139 137  K 4.8   < > 4.4 4.0 4.6 4.1 4.3  CL 109   < > 113* 112* 114* 113* 110  CO2 21*   < > 19* 19* 20* 20* 21*  GLUCOSE 169*   < > 216* 184* 183* 150* 156*  BUN 38*   < > 24* '20 17 12 17  '$ CREATININE 1.95*   < > 1.11* 0.96 0.93 0.78 1.03*  CALCIUM 9.1   < > 8.7* 8.4* 8.7* 8.8* 8.5*  MG 2.7*  --   --   --   --   --   --    < > = values in this interval not displayed.    GFR: Estimated Creatinine Clearance: 39.8 mL/min (A) (by C-G formula based on SCr of 1.03 mg/dL (H)). Liver Function Tests: No results for input(s): "AST", "ALT", "ALKPHOS",  "BILITOT", "PROT", "ALBUMIN" in the last 168 hours.  No results for input(s): "LIPASE", "AMYLASE" in the last 168 hours. No results for input(s): "AMMONIA" in the last 168 hours. Coagulation Profile: No results for input(s): "INR", "PROTIME" in the last 168 hours. Cardiac Enzymes: No results for input(s): "CKTOTAL", "CKMB", "CKMBINDEX", "TROPONINI" in the last 168 hours. BNP (last 3 results) No results for input(s): "PROBNP" in the last 8760 hours. HbA1C: No results for input(s): "HGBA1C" in the last 72 hours. CBG: Recent Labs  Lab 02/26/22 0751 02/26/22 1106 02/26/22 1601 02/27/22 0739 02/27/22 1101  GLUCAP 156* 120* 146* 141* 202*    Lipid Profile: No results for input(s): "CHOL", "HDL", "LDLCALC", "TRIG", "CHOLHDL", "LDLDIRECT" in the last 72 hours. Thyroid Function Tests: No results for input(s): "TSH", "T4TOTAL", "FREET4", "T3FREE", "THYROIDAB" in the last 72 hours. Anemia Panel: Recent Labs    02/25/22 0348  VITAMINB12 1,237*  FOLATE 27.8  FERRITIN 1,931*  TIBC 144*  IRON 32  RETICCTPCT 1.2    Sepsis Labs: Recent Labs  Lab 02/21/22 0538  PROCALCITON 0.51     Recent Results (from the past 240 hour(s))  Blood culture (routine x 2)     Status: None   Collection Time: 02/18/22  3:14 PM   Specimen: BLOOD  Result Value Ref Range Status   Specimen Description   Final    BLOOD LEFT ANTECUBITAL Performed at [facility] [address], [place], [place] [address]    Special Requests   Final    BOTTLES DRAWN AEROBIC AND ANAEROBIC Blood Culture results may not be optimal due to an inadequate volume of blood received in culture bottles Performed at [facility] [address] [place], [place] [address]    Culture   Final    NO GROWTH 5 DAYS Performed at [facility], [place] [address] [place], [place] [address]    Report Status 02/23/2022 FINAL  Final  Blood culture (routine x 2)     Status: None   Collection  Time: 02/18/22  3:30 PM   Specimen: BLOOD  Result Value Ref Range Status   Specimen Description   Final    BLOOD LEFT ANTECUBITAL Performed at [facility] [address] [place], [place] [address]    Special Requests   Final    BLOOD Blood Culture adequate volume Performed at [facility] [address], [place], [place] [address]    Culture   Final    NO GROWTH 5 DAYS Performed at [facility], [place] [address] [place],  [address]  Report Status 02/23/2022 FINAL  Final  SARS Coronavirus 2 by RT PCR (hospital order, performed in [facility] hospital lab) *cepheid single result test*     Status: None   Collection Time: 02/18/22  3:30 PM   Specimen: Nasal Swab  Result Value Ref Range Status   SARS Coronavirus 2 by RT PCR NEGATIVE NEGATIVE Final    Comment: (NOTE) SARS-CoV-2 target nucleic acids are NOT DETECTED.  The SARS-CoV-2 RNA is generally detectable in upper and lower respiratory specimens during the acute phase of infection. The lowest concentration of SARS-CoV-2 viral copies this assay can detect is 250 copies / mL. A negative result does not preclude SARS-CoV-2 infection and should not be used as the sole basis for treatment or other patient management decisions.  A negative result may occur with improper specimen collection / handling, submission of specimen other than nasopharyngeal swab, presence of viral mutation(s) within the areas targeted by this assay, and inadequate number of viral copies (<250 copies / mL). A negative result must be combined with clinical observations, patient history, and epidemiological information.  Fact Sheet for Patients:   https://www.patel.info/  Fact Sheet for Healthcare Providers: https://hall.com/  This test is not yet approved or  cleared by the [place] FDA and has been authorized for detection and/or diagnosis of SARS-CoV-2  by FDA under an Emergency Use Authorization (EUA).  This EUA will remain in effect (meaning this test can be used) for the duration of the COVID-19 declaration under Section 564(b)(1) of the Act, 21 U.S.C. section 360bbb-3(b)(1), unless the authorization is terminated or revoked sooner.  Performed at [facility], [place] [address], [place], [place] [address]   Urine Culture     Status: None   Collection Time: 02/18/22  4:17 PM   Specimen: Urine, Clean Catch  Result Value Ref Range Status   Specimen Description   Final    URINE, CLEAN CATCH Performed at [facility], [place] [address], [place], [place] [address]    Special Requests   Final    NONE Performed at [facility], [place] [address] [place], [place] [address]    Culture   Final    NO GROWTH Performed at [facility], [place] [address] [place], [place] [address]    Report Status 02/19/2022 FINAL  Final  Culture, body fluid w Gram Stain-bottle     Status: None   Collection Time: 02/18/22  7:04 PM   Specimen: Fluid  Result Value Ref Range Status   Specimen Description FLUID PLEURAL  Final   Special Requests   Final    BOTTLES DRAWN AEROBIC AND ANAEROBIC Blood Culture adequate volume   Gram Stain   Final    GRAM POSITIVE COCCI IN TETRADS BOTTLES DRAWN AEROBIC ONLY CRITICAL RESULT CALLED TO, READ BACK BY AND VERIFIED WITH: RN Francie Massing [phone] '@1350'$  FH Performed at [facility], [place] [address] [place],  [address]    Culture MODERATE STAPHYLOCOCCUS EPIDERMIDIS  Final   Report Status 02/25/2022 FINAL  Final   Organism ID, Bacteria STAPHYLOCOCCUS EPIDERMIDIS  Final      Susceptibility   Staphylococcus epidermidis - MIC*    CIPROFLOXACIN >=8 RESISTANT Resistant     ERYTHROMYCIN >=8 RESISTANT Resistant     GENTAMICIN <=0.5 SENSITIVE Sensitive     OXACILLIN >=4 RESISTANT Resistant     TETRACYCLINE 2 SENSITIVE Sensitive     VANCOMYCIN 1 SENSITIVE  Sensitive     TRIMETH/SULFA <=10  SENSITIVE Sensitive     CLINDAMYCIN >=8 RESISTANT Resistant     RIFAMPIN <=0.5 SENSITIVE Sensitive     Inducible Clindamycin NEGATIVE Sensitive     * MODERATE STAPHYLOCOCCUS EPIDERMIDIS  Gram stain     Status: None   Collection Time: 02/18/22  7:04 PM   Specimen: Fluid  Result Value Ref Range Status   Specimen Description FLUID PLEURAL  Final   Special Requests NONE  Final   Gram Stain   Final    FEW WBC PRESENT,BOTH PMN AND MONONUCLEAR NO ORGANISMS SEEN Performed at [facility], [address], [place], [place] [address]    Report Status 02/19/2022 FINAL  Final  MRSA Next Gen by PCR, Nasal     Status: None   Collection Time: 02/19/22 12:32 PM   Specimen: Nasal Mucosa; Nasal Swab  Result Value Ref Range Status   MRSA by PCR Next Gen NOT DETECTED NOT DETECTED Final    Comment: (NOTE) The GeneXpert MRSA Assay (FDA approved for NASAL specimens only), is one component of a comprehensive MRSA colonization surveillance program. It is not intended to diagnose MRSA infection nor to guide or monitor treatment for MRSA infections. Test performance is not FDA approved in patients less than 36 years old. Performed at [facility], [place] [address] [place], [place] [address]   Body fluid culture w Gram Stain     Status: None (Preliminary result)   Collection Time: 02/22/22 11:38 AM   Specimen: Pleura; Body Fluid  Result Value Ref Range Status   Specimen Description PLEURAL  Final   Special Requests LEFT  Final   Gram Stain NO WBC SEEN NO ORGANISMS SEEN   Final   Culture   Final    NO GROWTH < 24 HOURS Performed at [facility], [address] [place], [place] [address]    Report Status PENDING  Incomplete         Radiology Studies: DG CHEST PORT 1 VIEW  Result Date: 02/27/2022 CLINICAL DATA:  Pleural effusion EXAM: PORTABLE CHEST 1 VIEW COMPARISON:  Chest radiograph 1 day prior FINDINGS: The left basilar chest  tube is stable in position. The cardiomediastinal silhouette is stable. The left pleural effusion and left basilar opacities are unchanged. Linear opacities in the left mid lung are similar to the prior study and may reflect atelectasis or scar. The left upper lobe remains well-aerated. The right lung is clear. There is no significant right effusion. There is no pneumothorax There is no acute osseous abnormality. IMPRESSION: Stable left basilar chest tube with unchanged left pleural effusion and left basilar opacities. No new or worsening focal airspace disease. Electronically Signed   By: Valetta Mole M.D.   On: 02/27/2022 08:11   ECHOCARDIOGRAM LIMITED  Result Date: 02/26/2022    ECHOCARDIOGRAM LIMITED REPORT   Patient Name:   Heather Rogers Date of Exam: 02/26/2022 Medical Rec #:  [id number]          Height:       62.0 in Accession #:    [id number]         Weight:       144.0 lb Date of Birth:  1945/11/01          BSA:          1.662 m Patient Age:    89 years           BP:           138/54 mmHg  Patient Gender: F                  HR:           77 bpm. Exam Location:  Inpatient Procedure: Limited Echo Indications:    Pericardial effusion  History:        Patient has prior history of Echocardiogram examinations, most                 recent 09/03/2021. Risk Factors:Diabetes and Hypertension.  Sonographer:    Jefferey Pica Referring Phys: [id number] Hortencia Conradi MEIER IMPRESSIONS  1. Trivial pericardial effusion. Moderate pleural effusion.  2. Left ventricular ejection fraction, by estimation, is 70 to 75%. The left ventricle has hyperdynamic function. There is moderate left ventricular hypertrophy.  3. Left atrial size was mildly dilated.  4. Moderate pleural effusion in the left lateral region.  5. The inferior vena cava is normal in size with greater than 50% respiratory variability, suggesting right atrial pressure of 3 mmHg. FINDINGS  Left Ventricle: Left ventricular ejection fraction, by estimation, is 70 to  75%. The left ventricle has hyperdynamic function. There is moderate left ventricular hypertrophy. Left Atrium: Left atrial size was mildly dilated. Right Atrium: Right atrial size was normal in size. Pericardium: Trivial pericardial effusion is present. Aorta: The aortic root is normal in size and structure. Venous: The inferior vena cava is normal in size with greater than 50% respiratory variability, suggesting right atrial pressure of 3 mmHg. Additional Comments: Trivial pericardial effusion. Moderate pleural effusion. There is a moderate pleural effusion in the left lateral region. LEFT VENTRICLE PLAX 2D LVIDd:         3.60 cm LVIDs:         1.20 cm LV PW:         1.50 cm LV IVS:        1.60 cm  IVC IVC diam: 2.00 cm LEFT ATRIUM             Index        RIGHT ATRIUM           Index LA diam:        2.00 cm 1.20 cm/m   RA Area:     14.90 cm LA Vol (A2C):   54.8 ml 32.96 ml/m  RA Volume:   38.20 ml  22.98 ml/m LA Vol (A4C):   65.4 ml 39.34 ml/m LA Biplane Vol: 63.5 ml 38.20 ml/m   AORTA Ao Root diam: 3.20 cm Ao Asc diam:  3.00 cm Candee Furbish MD Electronically signed by Candee Furbish MD Signature Date/Time: 02/26/2022/5:08:13 PM    Final    DG CHEST PORT 1 VIEW  Result Date: 02/26/2022 CLINICAL DATA:  Pleural effusion. EXAM: PORTABLE CHEST 1 VIEW COMPARISON:  Chest radiograph and CT 02/22/2022 FINDINGS: The cardiac silhouette remains enlarged. Aortic atherosclerosis and a loop recorder remain in place. The left-sided pigtail pleural drain has been removed. A tunneled pleural catheter remains in place in the left lung base. There is an unchanged small left pleural effusion streaky left mid lung and patchy left basilar airspace opacities are unchanged. The right lung remains clear. No pneumothorax is identified. IMPRESSION: 1. Interval removal of left pigtail pleural drain. PleurX catheter remains in place with unchanged small left pleural effusion. No pneumothorax. 2. Unchanged left mid and lower lung  opacities which may reflect atelectasis. Electronically Signed   By: Logan Bores M.D.   On: 02/26/2022 08:23  Scheduled Meds:  alteplase (CATHFLO ACTIVASE) 10 mg in sodium chloride (PF) 0.9 % 30 mL  10 mg Intrapleural Once   amLODipine  10 mg Oral Daily   amoxicillin-clavulanate  1 tablet Oral Q12H   aspirin EC  81 mg Oral Daily   atorvastatin  80 mg Oral QPM   carvedilol  25 mg Oral BID WC   feeding supplement  237 mL Oral BID BM   furosemide  20 mg Intravenous Once   gabapentin  100 mg Oral TID   heparin  5,000 Units Subcutaneous Q8H   hydrALAZINE  50 mg Oral TID   insulin aspart  0-9 Units Subcutaneous TID WC   multivitamin with minerals  1 tablet Oral Daily   polyethylene glycol  17 g Oral Daily   sodium chloride flush  10 mL Intrapleural Q8H   sodium chloride flush  3 mL Intravenous Q12H   Continuous Infusions:     LOS: 9 days    Time spent: 35 minutes    Arneta Mahmood A Norah Devin, MD [facility]   If 7PM-7AM, please contact night-coverage [email or web address]  02/27/2022, 1:08 PM

## 2022-02-28 DIAGNOSIS — R112 Nausea with vomiting, unspecified: Secondary | ICD-10-CM

## 2022-02-28 LAB — BASIC METABOLIC PANEL
Anion gap: 5 (ref 5–15)
BUN: 15 mg/dL (ref 8–23)
CO2: 22 mmol/L (ref 22–32)
Calcium: 8.7 mg/dL — ABNORMAL LOW (ref 8.9–10.3)
Chloride: 112 mmol/L — ABNORMAL HIGH (ref 98–111)
Creatinine, Ser: 0.95 mg/dL (ref 0.44–1.00)
GFR, Estimated: >60 mL/min (ref >60)
Glucose, Bld: 143 mg/dL — ABNORMAL HIGH (ref 70–99)
Potassium: 4.2 mmol/L (ref 3.5–5.1)
Sodium: 139 mmol/L (ref 135–145)

## 2022-02-28 LAB — CBC
HCT: 29.7 % — ABNORMAL LOW (ref 36.0–46.0)
Hemoglobin: 10 g/dL — ABNORMAL LOW (ref 12.0–15.0)
MCH: 27.3 pg (ref 26.0–34.0)
MCHC: 33.7 g/dL (ref 30.0–36.0)
MCV: 81.1 fL (ref 80.0–100.0)
Platelets: 364 10*3/uL (ref 150–400)
RBC: 3.66 MIL/uL — ABNORMAL LOW (ref 3.87–5.11)
RDW: 14.6 % (ref 11.5–15.5)
WBC: 6.8 10*3/uL (ref 4.0–10.5)
nRBC: 0 % (ref 0.0–0.2)

## 2022-02-28 LAB — TYPE AND SCREEN
ABO/RH(D): O POS
Antibody Screen: NEGATIVE
Unit division: 0
Unit division: 0

## 2022-02-28 LAB — GLUCOSE, CAPILLARY
Glucose-Capillary: 140 mg/dL — ABNORMAL HIGH (ref 70–99)
Glucose-Capillary: 155 mg/dL — ABNORMAL HIGH (ref 70–99)
Glucose-Capillary: 145 mg/dL — ABNORMAL HIGH (ref 70–99)
Glucose-Capillary: 157 mg/dL — ABNORMAL HIGH (ref 70–99)

## 2022-02-28 LAB — BPAM RBC
Blood Product Expiration Date: 202308272359
Blood Product Expiration Date: 202309042359
ISSUE DATE / TIME: 202308010805
ISSUE DATE / TIME: 202308011132
Unit Type and Rh: 5100
Unit Type and Rh: 5100

## 2022-02-28 NOTE — Progress Notes (Signed)
Physical Therapy Treatment Patient Details Name: Heather Rogers MRN: [id number] DOB: 04-14-1946 Today's Date: 02/28/2022   History of Present Illness Pt 76 yo female admitted with malignant pleural effusion s/p 2 chest tubes and pleural fibrinolysis 7/24. Pt with one chest tube removed 7/28. Hx of pleurx cath placement 09/07/21, CVA, DM, met breast cancer, CKD    PT Comments    AxO x 3 very pleasant Lady, feeling "better".  Assisted OOB to amb to bathroom.  Slight unsteady without walker.  General transfer comment: self able from bed and toilet with good use of hands to steady self as well as perform self peri care. General Gait Details: tolerated amb full length unit with walker >350 feet.  Avg HR 83.  RA 97%.  No dyspnea.  Pt plans to return home with family support.  Communicated to TOC need for a youth RW.   Recommendations for follow up therapy are one component of a multi-disciplinary discharge planning process, led by the attending physician.  Recommendations may be updated based on patient status, additional functional criteria and insurance authorization.  Follow Up Recommendations  Home health PT     Assistance Recommended at Discharge Frequent or constant Supervision/Assistance  Patient can return home with the following A little help with bathing/dressing/bathroom;Assistance with cooking/housework;Assist for transportation;Help with stairs or ramp for entrance   Equipment Recommendations  Rolling walker (2 wheels) (youth)    Recommendations for Other Services       Precautions / Restrictions Precautions Precautions: Fall Precaution Comments: monitor vitals Restrictions Weight Bearing Restrictions: No     Mobility  Bed Mobility Overal bed mobility: Modified Independent             General bed mobility comments: self able    Transfers Overall transfer level: Needs assistance Equipment used: None Transfers: Sit to/from Stand Sit to Stand: Supervision            General transfer comment: self able from bed and toilet with good use of hands to steady self as well as perform self peri care.    Ambulation/Gait Ambulation/Gait assistance: Supervision Gait Distance (Feet): 350 Feet Assistive device: Rolling walker (2 wheels) Gait Pattern/deviations: Step-to pattern, Step-through pattern, Narrow base of support       General Gait Details: tolerated amb full length unit with walker >350 feet.  Avg HR 83.  RA 97%.  No dyspnea.  Feels "better".   Stairs             Wheelchair Mobility    Modified Rankin (Stroke Patients Only)       Balance                                            Cognition Arousal/Alertness: Awake/alert Behavior During Therapy: WFL for tasks assessed/performed Overall Cognitive Status: Within Functional Limits for tasks assessed                                 General Comments: AxO x 3 very pleasant        Exercises      General Comments        Pertinent Vitals/Pain Pain Assessment Pain Assessment: No/denies pain    Home Living  Prior Function            PT Goals (current goals can now be found in the care plan section) Progress towards PT goals: Progressing toward goals    Frequency    Min 3X/week      PT Plan Current plan remains appropriate    Co-evaluation              AM-PAC PT "6 Clicks" Mobility   Outcome Measure  Help needed turning from your back to your side while in a flat bed without using bedrails?: None Help needed moving from lying on your back to sitting on the side of a flat bed without using bedrails?: None Help needed moving to and from a bed to a chair (including a wheelchair)?: None Help needed standing up from a chair using your arms (e.g., wheelchair or bedside chair)?: A Little Help needed to walk in hospital room?: A Little Help needed climbing 3-5 steps with a railing? : A  Little 6 Click Score: 21    End of Session Equipment Utilized During Treatment: Gait belt Activity Tolerance: Patient tolerated treatment well Patient left: with call bell/phone within reach;with family/visitor present;in bed;with bed alarm set Nurse Communication: Mobility status PT Visit Diagnosis: Unsteadiness on feet (R26.81);Muscle weakness (generalized) (M62.81);Difficulty in walking, not elsewhere classified (R26.2)     Time: 6979-4801 PT Time Calculation (min) (ACUTE ONLY): 23 min  Charges:  $Gait Training: 8-22 mins $Therapeutic Activity: 8-22 mins                     Rica Koyanagi  PTA Acute  Rehabilitation Services Office M-F          [phone] Weekend pager [phone]

## 2022-02-28 NOTE — Progress Notes (Signed)
PCCM Brief Progress Note  On bedside US yesterday it looked like there was still some residual effusion. Attempted to drain with bottle today but only got a few drops of pleural fluid. Disconnected bottle and applied dressing to PleurX site. I said I'd come back by this afternoon to meet with Heather Rogers and her daughter to discuss whether we simply remove the PleurX today or tomorrow or whether she wishes to go home with it, attempting to drain it over the next week or so on the off chance it will drain with a greater fluid burden.   [place]

## 2022-02-28 NOTE — Consult Note (Signed)
   [facility] Inpatient Consult   02/28/2022  Heather Rogers 04-Dec-1945 [id number]  [facility] [ACO] Patient: [name]   *Remote review coverage for [facility].  Primary Care Provider:  Charlott Rakes, MD, [facility]  Addendum:  03/01/22 11:19 am Reviewed MD progress notes today for ongoing post hospital follow up  Call attempts to the room was unsuccessful. Spoke with [name] via phone number [phone], patient's HIPAA verified and patient in the background. Explained [facility] Care Coordination for support and follow up and potential benefits with [facility]. They're in agreement for [place] follow up. Will assign closer to hospital transition to home.   Patient screened for LLOS and high risk readmission risk hospitalization.  Review of patient's medical record reveals patient is being recommended [facility] per PT/OT.  Plan:  Continue to follow progress  for readmission prevention needs and disposition for post hospital care management needs.    For questions contact:   Natividad Brood, RN BSN [facility] Liaison  [phone] business mobile phone Toll free office [phone]  Fax number: [phone] [email or web address]     .

## 2022-02-28 NOTE — Progress Notes (Addendum)
[facility]  PROGRESS NOTE  Heather Rogers ENI:[id number] DOB: Sep 03, 1945 DOA: 02/18/2022 PCP: Charlott Rakes, MD   Brief HPI:   76 year old female with past medical history of metastatic left breast cancer, on active treatment with [name] and letrozole, malignant left pleural effusion s/p 2's catheter placement on 09/07/2021, history of CVA, CKD stage IIIb, anemia of chronic disease, diabetes mellitus type 2, hypertension, hyperlipidemia who came to ED for evaluation of fever and dyspnea.  Patient's daughter noted that she had increased output from Pleurx recently.  Normally drains about 4 to 50 cc every 4 days on 7/20 she had 400 cc out and on 7/23 she had 350 cc out. Patient also developed fever with temperature 101.8.  CT chest showed large pleural effusion with left lung atelectasis.  PE was negative.  Patient was started on IV vancomycin and cefepime.  CCM was consulted, patient underwent instillation of fibrinolytics into the Pleurx catheter on 7/24.  Chest x-ray showed complete opacification of left lung.  IR was consulted and patient underwent left-sided chest tube placement. Patient subsequently required second chest tube placed to high position on left side.     Subjective   Patient seen and examined, denies shortness of breath.   Assessment/Plan:    Malignant left pleural effusion -Left-sided complicated loculated pleural effusion -Acute hypoxemic respiratory failure with hypoxemia due to pleural effusion -Patient has Pleurx catheter since February 2023 -Noted to have decreased output; presented with fever -CCM was consulted, underwent instillation of fibrinolytics, tPA into the Pleurx catheter on 7/24 -Chest x-ray showed complete opacification of left side -Underwent chest tube placement per IR -Culture from pleural fluid grew Staph epidermidis, likely contaminant as per ID -ID was consulted recommended 2 weeks of antibiotics total, last day of antibiotics is  8/6 -Antibiotics changed to Augmentin, vancomycin and cefepime have been discontinued -Chest tube was removed on 7/28 -CCM tried third installation fibrinolytics to Pleurx catheter -CCM considering either to remove the Pleurx catheter or discharge home with Pleurx and try to see if the drainage will improve over next few days.  Acute kidney injury on CKD stage IIIb -Baseline creatinine 1.2-1.49 -On 7/26, creatinine peaked to 1.9 -Renal ultrasound was negative for hydronephrosis; increased cortical echogenicity suggest medical renal disease.  There are a few bilateral renal cysts. -Creatinine improved to 0.95  Primary malignant neoplasm of breast with metastasis -Metastatic left breast cancer with malignant left pleural effusion and osseous involvement. -Follow-up with Dr. Payton Mccallum -On Letrozole and Versenio.  Holding due to active infection.    Diabetes type 2: -Hemoglobin A1c 6.4 -Continue to hold metformin while inpatient. -Sliding scale insulin with NovoLog -CBG well controlled   Hypertension associated with diabetes - Continue with amlodipine, Coreg and hydralazine -Blood pressure is stable   Hyperlipidemia  -Continue with atorvastatin   Anemia of chronic disease -Hb trending down. anemia panel anemia of chronic diseases.  -Hemoglobin was down to 6.1 -S/p units 2 units PRBC -Hemoglobin improved to 10.0 -Follow CBC in a.m.   History of CVA -Continue with aspirin and statin   Syncope -Likely vasovagal.  Orthostatics negative.   -No significant arrhythmia on telemetry      Medications     alteplase (CATHFLO ACTIVASE) 10 mg in sodium chloride (PF) 0.9 % 30 mL  10 mg Intrapleural Once   amLODipine  10 mg Oral Daily   amoxicillin-clavulanate  1 tablet Oral Q12H   aspirin EC  81 mg Oral Daily   atorvastatin  80 mg Oral QPM  carvedilol  25 mg Oral BID WC   feeding supplement  237 mL Oral BID BM   gabapentin  100 mg Oral TID   heparin  5,000 Units Subcutaneous Q8H    hydrALAZINE  50 mg Oral TID   insulin aspart  0-9 Units Subcutaneous TID WC   multivitamin with minerals  1 tablet Oral Daily   polyethylene glycol  17 g Oral Daily   sodium chloride flush  10 mL Intrapleural Q8H   sodium chloride flush  3 mL Intravenous Q12H     Data Reviewed:   CBG:  Recent Labs  Lab 02/27/22 1101 02/27/22 1634 02/27/22 2108 02/28/22 0745 02/28/22 1136  GLUCAP 202* 172* 168* 140* 155*    SpO2: 97 % O2 Flow Rate (L/min): 2 L/min    Vitals:   02/27/22 2111 02/28/22 0500 02/28/22 0555 02/28/22 1140  BP: (!) 133/51  (!) 153/59 (!) 142/69  Pulse: 83  80 76  Resp: '16  17 18  '$ Temp: 99 F (37.2 C)  98.5 F (36.9 C) 97.9 F (36.6 C)  TempSrc: Oral  Oral   SpO2: 95%  97% 97%  Weight:  61.5 kg    Height:          Data Reviewed:  Basic Metabolic Panel: Recent Labs  Lab 02/24/22 0723 02/25/22 0348 02/26/22 0400 02/27/22 0327 02/28/22 0532  NA 139 139 139 137 139  K 4.0 4.6 4.1 4.3 4.2  CL 112* 114* 113* 110 112*  CO2 19* 20* 20* 21* 22  GLUCOSE 184* 183* 150* 156* 143*  BUN '20 17 12 17 15  '$ CREATININE 0.96 0.93 0.78 1.03* 0.95  CALCIUM 8.4* 8.7* 8.8* 8.5* 8.7*    CBC: Recent Labs  Lab 02/24/22 0723 02/25/22 0348 02/26/22 0400 02/27/22 0327 02/27/22 1707 02/28/22 0532  WBC 3.8* 4.4 4.8 6.1  --  6.8  HGB 7.2* 7.0* 7.0* 6.1* 11.1* 10.0*  HCT 21.5* 21.1* 21.4* 18.9* 32.9* 29.7*  MCV 78.2* 77.0* 79.0* 79.4*  --  81.1  PLT 287 307 325 353  --  364    LFT No results for input(s): "AST", "ALT", "ALKPHOS", "BILITOT", "PROT", "ALBUMIN" in the last 168 hours.   Antibiotics: Anti-infectives (From admission, onward)    Start     Dose/Rate Route Frequency Ordered Stop   02/26/22 2200  amoxicillin-clavulanate (AUGMENTIN) 875-125 MG per tablet 1 tablet        1 tablet Oral Every 12 hours 02/26/22 1106 03/04/22 0959   02/25/22 0600  vancomycin (VANCOCIN) IVPB 1000 mg/200 mL premix  Status:  Discontinued        1,000 mg 200 mL/hr over 60  Minutes Intravenous Every 36 hours 02/24/22 1244 02/26/22 1106   02/24/22 1330  ceFEPIme (MAXIPIME) 2 g in sodium chloride 0.9 % 100 mL IVPB  Status:  Discontinued        2 g 200 mL/hr over 30 Minutes Intravenous Every 12 hours 02/24/22 1244 02/26/22 1106   02/23/22 1615  vancomycin (VANCOREADY) IVPB 1250 mg/250 mL  Status:  Discontinued        1,250 mg 166.7 mL/hr over 90 Minutes Intravenous Every 24 hours 02/23/22 1516 02/24/22 1244   02/20/22 1600  vancomycin (VANCOCIN) IVPB 1000 mg/200 mL premix  Status:  Discontinued        1,000 mg 200 mL/hr over 60 Minutes Intravenous Every 48 hours 02/18/22 2008 02/20/22 1153   02/19/22 1600  ceFEPIme (MAXIPIME) 2 g in sodium chloride 0.9 % 100 mL IVPB  Status:  Discontinued        2 g 200 mL/hr over 30 Minutes Intravenous Every 24 hours 02/18/22 2004 02/24/22 1244   02/18/22 1530  ceFEPIme (MAXIPIME) 2 g in sodium chloride 0.9 % 100 mL IVPB        2 g 200 mL/hr over 30 Minutes Intravenous  Once 02/18/22 1517 02/18/22 1608   02/18/22 1530  vancomycin (VANCOCIN) IVPB 1000 mg/200 mL premix        1,000 mg 200 mL/hr over 60 Minutes Intravenous  Once 02/18/22 1526 02/18/22 1715        DVT prophylaxis: Heparin  Code Status: Full code  Family Communication: No family at bedside   CONSULTS PCCM   Objective    Physical Examination:  General-appears in no acute distress Heart-S1-S2, regular, no murmur auscultated Lungs-clear to auscultation bilaterally, no wheezing or crackles auscultated Abdomen-soft, nontender, no organomegaly Extremities-no edema in the lower extremities Neuro-alert, oriented x3, no focal deficit noted   Status is: Inpatient:             Oswald Hillock   [facility] If 7PM-7AM, please contact night-coverage at [email or web address], Office  [phone]   02/28/2022, 1:59 PM  LOS: 10 days

## 2022-03-01 LAB — BASIC METABOLIC PANEL
Anion gap: 4 — ABNORMAL LOW (ref 5–15)
BUN: 14 mg/dL (ref 8–23)
CO2: 23 mmol/L (ref 22–32)
Calcium: 8.5 mg/dL — ABNORMAL LOW (ref 8.9–10.3)
Chloride: 113 mmol/L — ABNORMAL HIGH (ref 98–111)
Creatinine, Ser: 0.95 mg/dL (ref 0.44–1.00)
GFR, Estimated: >60 mL/min (ref >60)
Glucose, Bld: 124 mg/dL — ABNORMAL HIGH (ref 70–99)
Potassium: 4 mmol/L (ref 3.5–5.1)
Sodium: 140 mmol/L (ref 135–145)

## 2022-03-01 LAB — CBC
HCT: 30.3 % — ABNORMAL LOW (ref 36.0–46.0)
Hemoglobin: 10.1 g/dL — ABNORMAL LOW (ref 12.0–15.0)
MCH: 27.3 pg (ref 26.0–34.0)
MCHC: 33.3 g/dL (ref 30.0–36.0)
MCV: 81.9 fL (ref 80.0–100.0)
Platelets: 440 10*3/uL — ABNORMAL HIGH (ref 150–400)
RBC: 3.7 MIL/uL — ABNORMAL LOW (ref 3.87–5.11)
RDW: 14.9 % (ref 11.5–15.5)
WBC: 5.8 10*3/uL (ref 4.0–10.5)
nRBC: 0 % (ref 0.0–0.2)

## 2022-03-01 LAB — GLUCOSE, CAPILLARY: Glucose-Capillary: 140 mg/dL — ABNORMAL HIGH (ref 70–99)

## 2022-03-01 MED ORDER — AMOXICILLIN-POT CLAVULANATE 875-125 MG PO TABS: 1.0000 | ORAL_TABLET | Freq: Two times a day (BID) | ORAL | 0 refills | Status: AC

## 2022-03-01 NOTE — Discharge Instructions (Signed)
[place] home ca

## 2022-03-01 NOTE — TOC Transition Note (Signed)
Transition of Care [facility]) - CM/SW Discharge Note   Patient Details  Name: Heather Rogers MRN: [id number] Date of Birth: 1945/08/16  [facility]) CM/SW Contact:  Leeroy Cha, RN Phone Number: 03/01/2022, 11:21 AM   Clinical Narrative:    Patient dcd to go home with hhc -p.t. through [place].  Correy with [facility] notified.   Final next level of care: [place] Barriers to Discharge: No Barriers Identified   Patient Goals and CMS Choice Patient states their goals for this hospitalization and ongoing recovery are:: to go home CMS Medicare.gov Compare Post Acute Care list provided to:: Patient Choice offered to / list presented to : Patient  Discharge Placement                       Discharge Plan and Services   Discharge Planning Services: CM Consult Post Acute Care Choice: Home Health                    HH Arranged: PT [place]: [place] Date [place]: 03/01/22 Time HH Agency Contacted: 1120 Representative spoke with at [place]: Corry barnett  Social Determinants of Health ([place]) Interventions     Readmission Risk Interventions     No data to display

## 2022-03-01 NOTE — Discharge Summary (Signed)
Physician Discharge Summary   Patient: Heather Rogers MRN: [id number] DOB: 11-05-1945  Admit date:     02/18/2022  Discharge date: 03/01/22  Discharge Physician: Oswald Hillock   PCP: Charlott Rakes, MD   Recommendations at discharge:   Follow-up PCP in 2 weeks Follow-up pulmonology as needed for consideration for Pleurx catheter removal as outpatient  Discharge Diagnoses: Principal Problem:   Malignant left pleural effusion Active Problems:   Syncope   Acute respiratory failure with hypoxia ([name])   Primary malignant neoplasm of breast with metastasis (HCC)   Chronic kidney disease, stage 3b (HCC)   Type 2 diabetes mellitus (A1c 6.4 on 2/5)    Hypertension associated with diabetes ([place])   History of CVA   Anemia of chronic disease   Hyperlipidemia associated with type 2 diabetes mellitus ([place])  Resolved Problems:   * No resolved hospital problems. *  Hospital Course:  76 year old female with past medical history of metastatic left breast cancer, on active treatment with [name] and letrozole, malignant left pleural effusion s/p 2's catheter placement on 09/07/2021, history of CVA, CKD stage IIIb, anemia of chronic disease, diabetes mellitus type 2, hypertension, hyperlipidemia who came to ED for evaluation of fever and dyspnea.  Patient's daughter noted that she had increased output from Pleurx recently.  Normally drains about 4 to 50 cc every 4 days on 7/20 she had 400 cc out and on 7/23 she had 350 cc out. Patient also developed fever with temperature 101.8.  CT chest showed large pleural effusion with left lung atelectasis.  PE was negative.  Patient was started on IV vancomycin and cefepime.  CCM was consulted, patient underwent instillation of fibrinolytics into the Pleurx catheter on 7/24.  Chest x-ray showed complete opacification of left lung.  IR was consulted and patient underwent left-sided chest tube placement. Patient subsequently required second chest tube placed to  high position on left side.  Assessment and Plan:  Malignant left pleural effusion -Left-sided complicated loculated pleural effusion -Acute hypoxemic respiratory failure with hypoxemia due to pleural effusion -Patient has Pleurx catheter since February 2023 -Noted to have decreased output; presented with fever -CCM was consulted, underwent instillation of fibrinolytics, tPA into the Pleurx catheter on 7/24 -Chest x-ray showed complete opacification of left side -Underwent chest tube placement per IR -Culture from pleural fluid grew Staph epidermidis, likely contaminant as per ID -ID was consulted recommended 2 weeks of antibiotics total, last day of antibiotics is 8/6; will discharge on Augmentin for 3 more days -Antibiotics changed to Augmentin, vancomycin and cefepime have been discontinued -Chest tube was removed on 7/28 -CCM tried third installation fibrinolytics to Pleurx cathet -PCCM recommends to discharge patient home with  with Pleurx and try to see if the drainage will improve over next few days. -Patient will contact with PCCM as outpatient if they would consider to remove the Pleurx catheter as outpatient   Acute kidney injury on CKD stage IIIb -Baseline creatinine 1.2-1.49 -On 7/26, creatinine peaked to 1.9 -Renal ultrasound was negative for hydronephrosis; increased cortical echogenicity suggest medical renal disease.  There are a few bilateral renal cysts. -Creatinine improved to 0.95   Primary malignant neoplasm of breast with metastasis -Metastatic left breast cancer with malignant left pleural effusion and osseous involvement. -Follow-up with Dr. Lindi Adie -On Letrozole and Versenio.  Holding due to active infection.    Diabetes type 2: -Hemoglobin A1c 6.4 -Continue home regimen   Hypertension associated with diabetes - Continue with amlodipine, Coreg and hydralazine -Blood  pressure is stable   Hyperlipidemia  -Continue with atorvastatin   Anemia of chronic  disease -Hb trending down. anemia panel anemia of chronic diseases.  -Hemoglobin was down to 6.1 -S/p units 2 units PRBC -Hemoglobin is stable at 10.1 today    History of CVA -Continue with aspirin and statin   Syncope -Likely vasovagal.  Orthostatics negative.   -No significant arrhythmia on telemetry           Consultants: PCCM Procedures performed: Chest tube placement Disposition: Home Diet recommendation:  Carb modified diet DISCHARGE MEDICATION: Allergies as of 03/01/2022       Reactions   Sulfa Antibiotics    Itching        Medication List     TAKE these medications    abemaciclib 100 MG tablet Commonly known as: [name] Take 1 tablet (100 mg total) by mouth 2 (two) times daily. Swallow tablets whole. Do not chew, crush, or split tablets before swallowing.   Accu-Chek FastClix Lancets Misc Check blood sugar fasting and before meals and again if pt feels bad (symptoms of hypo).   Accu-Chek Guide test strip Generic drug: glucose blood USE TO CHECK FASTING BLOOD SUGAR AND BEFORE MEALS AND AGAIN IF PATIENT FEELS BAD; SYMPTOMS OF HYPOGLYCEMIA   Accu-Chek Guide w/Device Kit 1 each by Does not apply route as directed.   allopurinol 100 MG tablet Commonly known as: ZYLOPRIM Take 1 tablet (100 mg total) by mouth daily.   amLODipine 10 MG tablet Commonly known as: NORVASC Take 10 mg by mouth daily.   amoxicillin-clavulanate 875-125 MG tablet Commonly known as: AUGMENTIN Take 1 tablet by mouth every 12 (twelve) hours for 3 days.   aspirin EC 81 MG tablet Take 81 mg by mouth daily. Swallow whole.   atorvastatin 80 MG tablet Commonly known as: LIPITOR TAKE ONE TABLET BY MOUTH EVERY EVENING TO LOWER CHOLESTEROL What changed:  how much to take how to take this when to take this additional instructions   carvedilol 25 MG tablet Commonly known as: COREG Take 1 tablet (25 mg total) by mouth 2 (two) times daily with a meal.   cholecalciferol 25 MCG  (1000 UNIT) tablet Commonly known as: VITAMIN D3 Take 1,000 Units by mouth daily.   ferrous sulfate 325 (65 FE) MG EC tablet Take 1 tablet (325 mg total) by mouth in the morning and at bedtime.   gabapentin 100 MG capsule Commonly known as: NEURONTIN Take 1 capsule (100 mg total) by mouth 3 (three) times daily.   hydrALAZINE 50 MG tablet Commonly known as: APRESOLINE Take 1 tablet (50 mg total) by mouth 3 (three) times daily.   letrozole 2.5 MG tablet Commonly known as: FEMARA Take 1 tablet (2.5 mg total) by mouth daily.   metFORMIN 500 MG tablet Commonly known as: GLUCOPHAGE Take 1 tablet (500 mg total) by mouth daily with breakfast. TAKE ONE TABLET BY MOUTH TWICE A DAY WITH A MEAL What changed: additional instructions   multivitamin with minerals Tabs tablet Take 1 tablet by mouth daily.   ondansetron 8 MG tablet Commonly known as: ZOFRAN Take 1 tablet (8 mg total) by mouth every 8 (eight) hours as needed for nausea or vomiting.   polyethylene glycol 17 g packet Commonly known as: MIRALAX / GLYCOLAX Take 17 g by mouth daily.   prochlorperazine 10 MG tablet Commonly known as: COMPAZINE Take 1 tablet (10 mg total) by mouth every 6 (six) hours as needed for nausea or vomiting.  Discharge Exam: Filed Weights   02/27/22 0636 02/28/22 0500 03/01/22 0442  Weight: 60.6 kg 61.5 kg 60.9 kg   General-appears in no acute distress Heart-S1-S2, regular, no murmur auscultated Lungs-clear to auscultation bilaterally, no wheezing or crackles auscultated Abdomen-soft, nontender, no organomegaly Extremities-no edema in the lower extremities Neuro-alert, oriented x3, no focal deficit noted  Condition at discharge: good  The results of significant diagnostics from this hospitalization (including imaging, microbiology, ancillary and laboratory) are listed below for reference.   Imaging Studies: DG CHEST PORT 1 VIEW  Result Date: 02/27/2022 CLINICAL DATA:  Pleural  effusion EXAM: PORTABLE CHEST 1 VIEW COMPARISON:  Chest radiograph 1 day prior FINDINGS: The left basilar chest tube is stable in position. The cardiomediastinal silhouette is stable. The left pleural effusion and left basilar opacities are unchanged. Linear opacities in the left mid lung are similar to the prior study and may reflect atelectasis or scar. The left upper lobe remains well-aerated. The right lung is clear. There is no significant right effusion. There is no pneumothorax There is no acute osseous abnormality. IMPRESSION: Stable left basilar chest tube with unchanged left pleural effusion and left basilar opacities. No new or worsening focal airspace disease. Electronically Signed   By: Valetta Mole M.D.   On: 02/27/2022 08:11   ECHOCARDIOGRAM LIMITED  Result Date: 02/26/2022    ECHOCARDIOGRAM LIMITED REPORT   Patient Name:   GUSTIE BOBB Date of Exam: 02/26/2022 Medical Rec #:  [id number]          Height:       62.0 in Accession #:    [id number]         Weight:       144.0 lb Date of Birth:  01/06/1946          BSA:          1.662 m Patient Age:    35 years           BP:           138/54 mmHg Patient Gender: F                  HR:           77 bpm. Exam Location:  Inpatient Procedure: Limited Echo Indications:    Pericardial effusion  History:        Patient has prior history of Echocardiogram examinations, most                 recent 09/03/2021. Risk Factors:Diabetes and Hypertension.  Sonographer:    Jefferey Pica Referring Phys: [id number] Hortencia Conradi MEIER IMPRESSIONS  1. Trivial pericardial effusion. Moderate pleural effusion.  2. Left ventricular ejection fraction, by estimation, is 70 to 75%. The left ventricle has hyperdynamic function. There is moderate left ventricular hypertrophy.  3. Left atrial size was mildly dilated.  4. Moderate pleural effusion in the left lateral region.  5. The inferior vena cava is normal in size with greater than 50% respiratory variability, suggesting right  atrial pressure of 3 mmHg. FINDINGS  Left Ventricle: Left ventricular ejection fraction, by estimation, is 70 to 75%. The left ventricle has hyperdynamic function. There is moderate left ventricular hypertrophy. Left Atrium: Left atrial size was mildly dilated. Right Atrium: Right atrial size was normal in size. Pericardium: Trivial pericardial effusion is present. Aorta: The aortic root is normal in size and structure. Venous: The inferior vena cava is normal in size with greater than 50% respiratory variability, suggesting right atrial  pressure of 3 mmHg. Additional Comments: Trivial pericardial effusion. Moderate pleural effusion. There is a moderate pleural effusion in the left lateral region. LEFT VENTRICLE PLAX 2D LVIDd:         3.60 cm LVIDs:         1.20 cm LV PW:         1.50 cm LV IVS:        1.60 cm  IVC IVC diam: 2.00 cm LEFT ATRIUM             Index        RIGHT ATRIUM           Index LA diam:        2.00 cm 1.20 cm/m   RA Area:     14.90 cm LA Vol (A2C):   54.8 ml 32.96 ml/m  RA Volume:   38.20 ml  22.98 ml/m LA Vol (A4C):   65.4 ml 39.34 ml/m LA Biplane Vol: 63.5 ml 38.20 ml/m   AORTA Ao Root diam: 3.20 cm Ao Asc diam:  3.00 cm Candee Furbish MD Electronically signed by Candee Furbish MD Signature Date/Time: 02/26/2022/5:08:13 PM    Final    DG CHEST PORT 1 VIEW  Result Date: 02/26/2022 CLINICAL DATA:  Pleural effusion. EXAM: PORTABLE CHEST 1 VIEW COMPARISON:  Chest radiograph and CT 02/22/2022 FINDINGS: The cardiac silhouette remains enlarged. Aortic atherosclerosis and a loop recorder remain in place. The left-sided pigtail pleural drain has been removed. A tunneled pleural catheter remains in place in the left lung base. There is an unchanged small left pleural effusion streaky left mid lung and patchy left basilar airspace opacities are unchanged. The right lung remains clear. No pneumothorax is identified. IMPRESSION: 1. Interval removal of left pigtail pleural drain. PleurX catheter remains  in place with unchanged small left pleural effusion. No pneumothorax. 2. Unchanged left mid and lower lung opacities which may reflect atelectasis. Electronically Signed   By: Logan Bores M.D.   On: 02/26/2022 08:23   CT CHEST WO CONTRAST  Result Date: 02/22/2022 CLINICAL DATA:  History of LEFT breast cancer with malignant pleural effusion. Also with diminished PleurX catheter output and loculated pleural fluid in the LEFT chest. * Tracking Code: BO * EXAM: CT CHEST WITHOUT CONTRAST TECHNIQUE: Multidetector CT imaging of the chest was performed following the standard protocol without IV contrast. RADIATION DOSE REDUCTION: This exam was performed according to the departmental dose-optimization program which includes automated exposure control, adjustment of the mA and/or kV according to patient size and/or use of iterative reconstruction technique. COMPARISON:  Prior study from February 18, 2022 FINDINGS: Cardiovascular: Calcified aortic atherosclerosis and signs of coronary artery disease. Heart size stable. Presumed malignant pleural fluid abuts the LEFT heart border with diminished volume since previous imaging in a generalized fashion throughout the chest. Suspect nodularity along the LEFT mediastinal border in an area that shows diminished pleural fluid since the recent comparison image (image 37/2) pleural thickening up to 7 mm. Central pulmonary vasculature is normal caliber. Limited assessment of cardiovascular structures given lack of intravenous contrast. Mediastinum/Nodes: Small lymph nodes throughout the mediastinum are similar to the very recent comparison imaging study. No thoracic inlet adenopathy. Irregular LEFT axillary lymph nodes largest 12 mm (image 51/2) these track into retropectoral nodal stations with irregular borders though with small size, a small lymph node along the margin of the LEFT pectoralis musculature measuring 7 mm (image 23/2 this serves as an example. Lungs/Pleura: Diminished  pleural fluid in the  LEFT chest still with medial loculation along the LEFT heart border measuring approximately 4.5 cm greatest thickness which is unchanged compared to the previous exam. The peripheral and superior aspects of loculated pleural fluid have decreased. There are small amounts of gas within the pleural space. The pigtail catheter which has been inserted into the LEFT pleural space in the interval resides in the posterior LEFT chest and shows marker for sideholes at the edge of the pleural space (image 54/2) no signs of consolidation in the RIGHT lung. Airways to the RIGHT chest are patent. There is airspace disease in the LEFT chest much of which is due to collapse with only slightly improved aeration since previous imaging along the peripheral LEFT chest, still with some volume loss along the LEFT heart border. Upper Abdomen: Incidental imaging of upper abdominal contents without acute process. Musculoskeletal: Bony destruction of the sternum with mixed lytic and sclerotic features is similar to prior imaging. No acute bone process. LEFT breast mass unchanged in the short interval compatible with known breast cancer. Spinal degenerative changes. IMPRESSION: 1. Diminished volume of pleural fluid along the peripheral and upper chest following placement of a pigtail drainage catheter into a malignant effusion. Marker for sideholes at the edge of the pleural space, close attention on follow-up is suggested. 2. Small locules of gas within the diminished LEFT-sided pleural effusion, not unexpected following manipulation of PleurX catheter and placement of a LEFT pleural drain. 3. Loculated fluid along the LEFT heart border and volume loss in the LEFT lower chest is similar, perhaps slightly improved aeration in the upper chest compared to previous imaging. 4. Otherwise stable appearance of oncologic findings in the chest in the short interval. Aortic Atherosclerosis (ICD10-I70.0). These results will be  called to the ordering clinician or representative by the Radiologist Assistant, and communication documented in the PACS or [employer]. Electronically Signed   By: Zetta Bills M.D.   On: 02/22/2022 14:10   DG CHEST PORT 1 VIEW  Result Date: 02/22/2022 CLINICAL DATA:  Left pleural effusion EXAM: PORTABLE CHEST 1 VIEW COMPARISON:  Previous studies including the examination of 02/21/2022 FINDINGS: Transverse diameter heart is increased. There is moderate left pleural effusion. Two left chest tubes are noted. Evaluation of left mid and left lower lung fields for infiltrates is limited by the effusion. There is no pneumothorax. Right lung is clear. IMPRESSION: No significant changes are noted in moderate left pleural effusion. Possible heterotopic phases/pneumonia in left mid and left lower lung fields. Right lung is clear. Electronically Signed   By: Elmer Picker M.D.   On: 02/22/2022 08:38   US RENAL  Result Date: 02/21/2022 CLINICAL DATA:  Renal dysfunction EXAM: RENAL / URINARY TRACT ULTRASOUND COMPLETE COMPARISON:  CT done on 02/18/2022 FINDINGS: Right Kidney: Renal measurements: 9.2 x 4.7 x 4.6 cm = volume: 102.7 mL. There is no hydronephrosis. There is increased echogenicity. There are few cysts in the left kidney in the upper pole and midportion each measuring less than 7 mm. Left Kidney: Renal measurements: 9.8 x 4.7 x 4.6 cm = volume: 109.7 mL. There is no hydronephrosis. There is increased echogenicity. There is 11 mm smoothly marginated hypoechoic lesion in the mid to lower portion of left kidney, possibly a cyst. Bladder: Appears normal for degree of bladder distention. Other: Technologist observed small left pleural effusion. IMPRESSION: There is no hydronephrosis. Increased cortical echogenicity suggests medical renal disease. There are few bilateral renal cysts. Electronically Signed   By: Prudy Feeler.D.  On: 02/21/2022 20:18   DG CHEST PORT 1 VIEW  Result Date:  02/21/2022 CLINICAL DATA:  Pleural effusion EXAM: PORTABLE CHEST 1 VIEW COMPARISON:  02/21/2022, 02/20/2022 FINDINGS: PleurX catheter at left base. Interim placement of pigtail drainage catheter over the left mid chest. Decreased left pleural effusion with small moderate residual. Airspace disease at left base. Right lung grossly clear. Borderline cardiomegaly with aortic atherosclerosis. Possible small amount of air within the left lower lateral pleural space. Aortic atherosclerosis. Electronic device over the left chest. IMPRESSION: Interim placement second drainage catheter on the left with decreased left pleural effusion. Small moderate residual left pleural effusion and airspace disease at the left base. Electronically Signed   By: Donavan Foil M.D.   On: 02/21/2022 17:59   CT IMAGE GUIDED DRAINAGE BY PERCUTANEOUS CATHETER  Result Date: 02/21/2022 INDICATION: 76 year old woman with history of metastatic breast cancer and loculated left pleural effusion presents to IR for chest tube placement. Patient has a left PleurX catheter, however drainage has been minimal. EXAM: CT-guided left chest tube placement MEDICATIONS: Fentanyl IV 50 mcg ANESTHESIA/SEDATION: None COMPLICATIONS: None immediate. TECHNIQUE: Informed written consent was obtained from the patient after a thorough discussion of the procedural risks, benefits and alternatives. All questions were addressed. Maximal Sterile Barrier Technique was utilized including caps, mask, sterile gowns, sterile gloves, sterile drape, hand hygiene and skin antiseptic. A timeout was performed prior to the initiation of the procedure. PROCEDURE: The left posterior chest wall skin was prepped with chlorhexidine in a sterile fashion, and a sterile drape was applied covering the operative field. A sterile gown and sterile gloves were used for the procedure. Local anesthesia was provided with 1% Lidocaine. Following local lidocaine administration, the left pleural fluid  was accessed with a 19 gauge Yueh needle utilizing CT guidance. The catheter was removed over 0.035 inch guidewire. Tract dilation was performed with 10 French dilator. 12.0 [place] dilator could not be advanced over the guidewire, likely due to narrow inter costal spaces. 12.0 [place] multipurpose pigtail drain would not track either. 10.2 [place] multipurpose pigtail drain was successfully inserted into the left pleural space. Final position of the pigtail was confirmed with CT. Drain attached to Pleur-Evac set at -20 cm H2O. Drain secured to skin with suture. FINDINGS: Large left pleural effusion IMPRESSION: 10.2 French multipurpose pigtail drain inserted into left pleural effusion. Unable to track larger drain into left pleural space, likely due to narrow intercostal spaces. Electronically Signed   By: Miachel Roux M.D.   On: 02/21/2022 15:37   DG CHEST PORT 1 VIEW  Result Date: 02/21/2022 CLINICAL DATA:  [id number] patient with chronic kidney disease 3, diabetes, hypertension, metastatic breast cancer on treatment, left pleural effusion and status post PleurX catheter insertion EXAM: PORTABLE CHEST 1 VIEW COMPARISON:  February 20, 2022 FINDINGS: Again seen is the PleurX catheter at the left lung base. There has been complete opacification of the left thorax in comparison to lower 3/4 in the previous study of pleural effusion. Leftward deviation of the mediastinum. Right lung remains clear. The visualized skeletal structures are unremarkable. IMPRESSION: There has been complete opacification of the left thorax of pleural effusion and has worsened in the interim. Stable left basal PleurX catheter. Right lung remains clear. Electronically Signed   By: Frazier Richards M.D.   On: 02/21/2022 09:37   DG Chest Port 1 View  Result Date: 02/20/2022 CLINICAL DATA:  Malignant left pleural effusion. EXAM: PORTABLE CHEST 1 VIEW COMPARISON:  Chest two views 02/18/2022, CT chest  02/18/2022 FINDINGS: Lung volumes are mildly  decreased compared to prior. There is again homogeneous opacification of the majority of the inferior 2/3 of the left hemithorax corresponding to the lobulated and likely loculated moderate to large left pleural effusion better seen on prior CT. The right lung appears clear. No pneumothorax is seen. The visualized portions of the cardiac silhouette and mediastinal contours are unchanged. Left basilar drainage catheter appears unchanged. IMPRESSION: Mildly decreased lung volumes compared to prior. Within this limitation, there is no significant change to possibly mild interval increase in the size of moderate-to-large loculated left pleural effusion better seen on prior CT. Electronically Signed   By: Yvonne Kendall M.D.   On: 02/20/2022 09:09   CT Angio Chest PE W and/or Wo Contrast  Result Date: 02/18/2022 CLINICAL DATA:  Pulmonary embolus suspected with high probability. History of metastatic breast cancer. Today vomiting and fever. Currently on oral chemotherapy. EXAM: CT ANGIOGRAPHY CHEST CT ABDOMEN AND PELVIS WITH CONTRAST TECHNIQUE: Multidetector CT imaging of the chest was performed using the standard protocol during bolus administration of intravenous contrast. Multiplanar CT image reconstructions and MIPs were obtained to evaluate the vascular anatomy. Multidetector CT imaging of the abdomen and pelvis was performed using the standard protocol during bolus administration of intravenous contrast. RADIATION DOSE REDUCTION: This exam was performed according to the departmental dose-optimization program which includes automated exposure control, adjustment of the mA and/or kV according to patient size and/or use of iterative reconstruction technique. CONTRAST:  43m OMNIPAQUE IOHEXOL 350 MG/ML SOLN COMPARISON:  PET-CT 09/29/2021.  CT chest 01/15/2022 FINDINGS: CTA CHEST FINDINGS Cardiovascular: Evaluation of the pulmonary arteries is limited by suboptimal contrast bolus and motion artifact. As visualized,  there are no significant filling defects in the central pulmonary arteries suggesting no large central pulmonary embolus. Heart size is normal. No pericardial effusion. Normal caliber thoracic aorta. No aortic dissection. Calcification of the aorta and coronary arteries. Mediastinum/Nodes: The esophagus is decompressed. Subcentimeter right thyroid gland nodule is unchanged since prior studies. Hilar, subcarinal, and left aortopulmonic window lymph nodes are similar to prior study with left AP window nodes measuring 1.6 cm in short axis dimension. This corresponds to known metastatic disease. Lungs/Pleura: There is a large left pleural effusion with somewhat nodular appearance likely representing a malignant effusion. This is significantly increased in size since the prior study despite presence of a chest drain. Atelectasis in the left lung. Right lung is clear. Airways are patent. Musculoskeletal: Degenerative changes in the spine. Heterogeneous areas of bone sclerosis and destruction in the sternum and in several midthoracic vertebra, likely representing bone metastasis. This corresponds to changes on prior PET-CT and CT. Large soft tissue mass in the left breast corresponding to known neoplasm. Skin thickening and infiltration could be neoplastic or radiation related. Lymphadenopathy in the left axilla consistent with metastatic disease. Review of the MIP images confirms the above findings. CT ABDOMEN and PELVIS FINDINGS Hepatobiliary: No focal liver abnormality is seen. No gallstones, gallbladder wall thickening, or biliary dilatation. Pancreas: Unremarkable. No pancreatic ductal dilatation or surrounding inflammatory changes. Spleen: Normal in size without focal abnormality. Adrenals/Urinary Tract: No adrenal gland nodules. Subcentimeter simple appearing cysts in both kidneys consistent with benign cyst. No imaging follow-up is indicated for these typically benign lesions. Renal nephrograms are symmetrical. No  hydronephrosis or hydroureter. Bladder is normal. Stomach/Bowel: Stomach is within normal limits. Appendix appears normal. No evidence of bowel wall thickening, distention, or inflammatory changes. Vascular/Lymphatic: Scattered lymph nodes in the celiac axis and retroperitoneum without  pathologic enlargement. Diffuse calcification of the aorta. No aneurysm. Reproductive: No abnormal pelvic masses. Other: No free air or free fluid in the abdomen. Abdominal wall musculature appears intact. Musculoskeletal: No destructive bone lesions. Review of the MIP images confirms the above findings. IMPRESSION: 1. Examination of the pulmonary arteries is technically limited but no significant central pulmonary embolus is identified. 2. Large left pleural effusion with left lung atelectasis demonstrates significant progression since previous study. A left chest tube is in place. 3. Metastatic lymphadenopathy in the mediastinum and hilar regions as well as bone metastases are unchanged. Left breast mass and left axillary lymphadenopathy again demonstrated. 4. No acute process demonstrated in the abdomen or pelvis. Electronically Signed   By: Lucienne Capers M.D.   On: 02/18/2022 17:58   CT ABDOMEN PELVIS W CONTRAST  Result Date: 02/18/2022 CLINICAL DATA:  Pulmonary embolus suspected with high probability. History of metastatic breast cancer. Today vomiting and fever. Currently on oral chemotherapy. EXAM: CT ANGIOGRAPHY CHEST CT ABDOMEN AND PELVIS WITH CONTRAST TECHNIQUE: Multidetector CT imaging of the chest was performed using the standard protocol during bolus administration of intravenous contrast. Multiplanar CT image reconstructions and MIPs were obtained to evaluate the vascular anatomy. Multidetector CT imaging of the abdomen and pelvis was performed using the standard protocol during bolus administration of intravenous contrast. RADIATION DOSE REDUCTION: This exam was performed according to the departmental  dose-optimization program which includes automated exposure control, adjustment of the mA and/or kV according to patient size and/or use of iterative reconstruction technique. CONTRAST:  69m OMNIPAQUE IOHEXOL 350 MG/ML SOLN COMPARISON:  PET-CT 09/29/2021.  CT chest 01/15/2022 FINDINGS: CTA CHEST FINDINGS Cardiovascular: Evaluation of the pulmonary arteries is limited by suboptimal contrast bolus and motion artifact. As visualized, there are no significant filling defects in the central pulmonary arteries suggesting no large central pulmonary embolus. Heart size is normal. No pericardial effusion. Normal caliber thoracic aorta. No aortic dissection. Calcification of the aorta and coronary arteries. Mediastinum/Nodes: The esophagus is decompressed. Subcentimeter right thyroid gland nodule is unchanged since prior studies. Hilar, subcarinal, and left aortopulmonic window lymph nodes are similar to prior study with left AP window nodes measuring 1.6 cm in short axis dimension. This corresponds to known metastatic disease. Lungs/Pleura: There is a large left pleural effusion with somewhat nodular appearance likely representing a malignant effusion. This is significantly increased in size since the prior study despite presence of a chest drain. Atelectasis in the left lung. Right lung is clear. Airways are patent. Musculoskeletal: Degenerative changes in the spine. Heterogeneous areas of bone sclerosis and destruction in the sternum and in several midthoracic vertebra, likely representing bone metastasis. This corresponds to changes on prior PET-CT and CT. Large soft tissue mass in the left breast corresponding to known neoplasm. Skin thickening and infiltration could be neoplastic or radiation related. Lymphadenopathy in the left axilla consistent with metastatic disease. Review of the MIP images confirms the above findings. CT ABDOMEN and PELVIS FINDINGS Hepatobiliary: No focal liver abnormality is seen. No gallstones,  gallbladder wall thickening, or biliary dilatation. Pancreas: Unremarkable. No pancreatic ductal dilatation or surrounding inflammatory changes. Spleen: Normal in size without focal abnormality. Adrenals/Urinary Tract: No adrenal gland nodules. Subcentimeter simple appearing cysts in both kidneys consistent with benign cyst. No imaging follow-up is indicated for these typically benign lesions. Renal nephrograms are symmetrical. No hydronephrosis or hydroureter. Bladder is normal. Stomach/Bowel: Stomach is within normal limits. Appendix appears normal. No evidence of bowel wall thickening, distention, or inflammatory changes. Vascular/Lymphatic: Scattered lymph  nodes in the celiac axis and retroperitoneum without pathologic enlargement. Diffuse calcification of the aorta. No aneurysm. Reproductive: No abnormal pelvic masses. Other: No free air or free fluid in the abdomen. Abdominal wall musculature appears intact. Musculoskeletal: No destructive bone lesions. Review of the MIP images confirms the above findings. IMPRESSION: 1. Examination of the pulmonary arteries is technically limited but no significant central pulmonary embolus is identified. 2. Large left pleural effusion with left lung atelectasis demonstrates significant progression since previous study. A left chest tube is in place. 3. Metastatic lymphadenopathy in the mediastinum and hilar regions as well as bone metastases are unchanged. Left breast mass and left axillary lymphadenopathy again demonstrated. 4. No acute process demonstrated in the abdomen or pelvis. Electronically Signed   By: Lucienne Capers M.D.   On: 02/18/2022 17:58   CT Head Wo Contrast  Result Date: 02/18/2022 CLINICAL DATA:  History of metastatic breast cancer presents with vomiting and fever. Currently on oral chemotherapy. One episode emesis. EXAM: CT HEAD WITHOUT CONTRAST CT CERVICAL SPINE WITHOUT CONTRAST TECHNIQUE: Multidetector CT imaging of the head and cervical spine was  performed following the standard protocol without intravenous contrast. Multiplanar CT image reconstructions of the cervical spine were also generated. RADIATION DOSE REDUCTION: This exam was performed according to the departmental dose-optimization program which includes automated exposure control, adjustment of the mA and/or kV according to patient size and/or use of iterative reconstruction technique. COMPARISON:  Head CT 05/15/2018 FINDINGS: CT HEAD FINDINGS Brain: Ventricles, cisterns and other CSF spaces are within normal. Mild chronic ischemic microvascular disease. Old left occipital infarct. No mass, mass effect or shift of midline structures. No acute hemorrhage. No definite acute infarction. Vascular: No hyperdense vessel or unexpected calcification. Skull: Normal. Negative for fracture or focal lesion. Sinuses/Orbits: No acute finding. Other: None. CT CERVICAL SPINE FINDINGS Alignment: Normal. Skull base and vertebrae: Mild to moderate spondylosis throughout the cervical spine to include facet arthropathy and uncovertebral joint spurring. Atlantoaxial articulation is unremarkable. Minimal bilateral neural foraminal narrowing at the C5-6 level and mild left-sided neural foraminal narrowing at the C6-7 level. No acute fracture. Soft tissues and spinal canal: Prevertebral soft tissues are normal. No significant canal stenosis. Disc levels: Disc space narrowing at the C5-6 and C6-7 levels and to lesser extent at the C4-5 and C7-T1 levels. Upper chest: No acute findings. Other: None. IMPRESSION: 1. No acute brain injury. 2. 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 3. No acute cervical spine injury. 4. Mild to moderate spondylosis throughout the cervical spine with multilevel disc disease and neural foraminal narrowing as described. Electronically Signed   By: Marin Olp M.D.   On: 02/18/2022 15:12   CT Cervical Spine Wo Contrast  Result Date: 02/18/2022 CLINICAL DATA:   History of metastatic breast cancer presents with vomiting and fever. Currently on oral chemotherapy. One episode emesis. EXAM: CT HEAD WITHOUT CONTRAST CT CERVICAL SPINE WITHOUT CONTRAST TECHNIQUE: Multidetector CT imaging of the head and cervical spine was performed following the standard protocol without intravenous contrast. Multiplanar CT image reconstructions of the cervical spine were also generated. RADIATION DOSE REDUCTION: This exam was performed according to the departmental dose-optimization program which includes automated exposure control, adjustment of the mA and/or kV according to patient size and/or use of iterative reconstruction technique. COMPARISON:  Head CT 05/15/2018 FINDINGS: CT HEAD FINDINGS Brain: Ventricles, cisterns and other CSF spaces are within normal. Mild chronic ischemic microvascular disease. Old left occipital infarct. No mass, mass effect or shift  of midline structures. No acute hemorrhage. No definite acute infarction. Vascular: No hyperdense vessel or unexpected calcification. Skull: Normal. Negative for fracture or focal lesion. Sinuses/Orbits: No acute finding. Other: None. CT CERVICAL SPINE FINDINGS Alignment: Normal. Skull base and vertebrae: Mild to moderate spondylosis throughout the cervical spine to include facet arthropathy and uncovertebral joint spurring. Atlantoaxial articulation is unremarkable. Minimal bilateral neural foraminal narrowing at the C5-6 level and mild left-sided neural foraminal narrowing at the C6-7 level. No acute fracture. Soft tissues and spinal canal: Prevertebral soft tissues are normal. No significant canal stenosis. Disc levels: Disc space narrowing at the C5-6 and C6-7 levels and to lesser extent at the C4-5 and C7-T1 levels. Upper chest: No acute findings. Other: None. IMPRESSION: 1. No acute brain injury. 2. 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 3. No acute cervical spine injury. 4. Mild to moderate  spondylosis throughout the cervical spine with multilevel disc disease and neural foraminal narrowing as described. Electronically Signed   By: Marin Olp M.D.   On: 02/18/2022 15:12   DG Chest 2 View  Result Date: 02/18/2022 CLINICAL DATA:  fx/sob EXAM: CHEST - 2 VIEW COMPARISON:  Chest x-ray 09/07/2021, CT chest 01/15/2022 FINDINGS: The heart and mediastinal contours are unchanged. Aortic calcification. Left upper lobe hazy airspace opacity. No pulmonary edema. Interval increase in size of a possibly loculated, moderate volume left pleural effusion. No right pleural effusion. No pneumothorax. No acute osseous abnormality. IMPRESSION: 1. Interval increase in size of a possibly loculated, moderate volume left pleural effusion. 2. Left upper lobe hazy airspace opacity may represent combination of atelectasis versus infection/inflammation. 3.  Aortic Atherosclerosis (ICD10-I70.0). Electronically Signed   By: Iven Finn M.D.   On: 02/18/2022 15:07    Microbiology: Results for orders placed or performed during the hospital encounter of 02/18/22  Blood culture (routine x 2)     Status: None   Collection Time: 02/18/22  3:14 PM   Specimen: BLOOD  Result Value Ref Range Status   Specimen Description   Final    BLOOD LEFT ANTECUBITAL Performed at [facility] [address], [place], [place] [address]    Special Requests   Final    BOTTLES DRAWN AEROBIC AND ANAEROBIC Blood Culture results may not be optimal due to an inadequate volume of blood received in culture bottles Performed at [facility] [address], [place], [place] [address]    Culture   Final    NO GROWTH 5 DAYS Performed at [facility], [place] [address], [place], [place] [address]    Report Status 02/23/2022 FINAL  Final  Blood culture (routine x 2)     Status: None   Collection Time: 02/18/22  3:30 PM   Specimen: BLOOD  Result Value Ref Range Status   Specimen Description    Final    BLOOD LEFT ANTECUBITAL Performed at [facility] [address], [place], [place] [address]    Special Requests   Final    BLOOD Blood Culture adequate volume Performed at [facility] [address] [place], [place] [address]    Culture   Final    NO GROWTH 5 DAYS Performed at [facility], [place] [address], [place], [place] [address]    Report Status 02/23/2022 FINAL  Final  SARS Coronavirus 2 by RT PCR (hospital order, performed in [facility] hospital lab) *cepheid single result test*     Status:  None   Collection Time: 02/18/22  3:30 PM   Specimen: Nasal Swab  Result Value Ref Range Status   SARS Coronavirus 2 by RT PCR NEGATIVE NEGATIVE Final    Comment: (NOTE) SARS-CoV-2 target nucleic acids are NOT DETECTED.  The SARS-CoV-2 RNA is generally detectable in upper and lower respiratory specimens during the acute phase of infection. The lowest concentration of SARS-CoV-2 viral copies this assay can detect is 250 copies / mL. A negative result does not preclude SARS-CoV-2 infection and should not be used as the sole basis for treatment or other patient management decisions.  A negative result may occur with improper specimen collection / handling, submission of specimen other than nasopharyngeal swab, presence of viral mutation(s) within the areas targeted by this assay, and inadequate number of viral copies (<250 copies / mL). A negative result must be combined with clinical observations, patient history, and epidemiological information.  Fact Sheet for Patients:   https://www.patel.info/  Fact Sheet for Healthcare Providers: https://hall.com/  This test is not yet approved or  cleared by the [place] FDA and has been authorized for detection and/or diagnosis of SARS-CoV-2 by FDA under an Emergency Use Authorization (EUA).  This EUA will remain in effect (meaning this test  can be used) for the duration of the COVID-19 declaration under Section 564(b)(1) of the Act, 21 U.S.C. section 360bbb-3(b)(1), unless the authorization is terminated or revoked sooner.  Performed at [facility], [place] [address], [place], [place] [address]   Urine Culture     Status: None   Collection Time: 02/18/22  4:17 PM   Specimen: Urine, Clean Catch  Result Value Ref Range Status   Specimen Description   Final    URINE, CLEAN CATCH Performed at [facility], [place] [address], [place], [place] [address]    Special Requests   Final    NONE Performed at [facility], [place] [address] [place], [place] [address]    Culture   Final    NO GROWTH Performed at [facility], [place] [address] [place], [place] [address]    Report Status 02/19/2022 FINAL  Final  Culture, body fluid w Gram Stain-bottle     Status: None   Collection Time: 02/18/22  7:04 PM   Specimen: Fluid  Result Value Ref Range Status   Specimen Description FLUID PLEURAL  Final   Special Requests   Final    BOTTLES DRAWN AEROBIC AND ANAEROBIC Blood Culture adequate volume   Gram Stain   Final    GRAM POSITIVE COCCI IN TETRADS BOTTLES DRAWN AEROBIC ONLY CRITICAL RESULT CALLED TO, READ BACK BY AND VERIFIED WITH: RN Francie Massing [phone] @1350  FH Performed at [facility], [place] [address], [place], [place] [address]    Culture MODERATE STAPHYLOCOCCUS EPIDERMIDIS  Final   Report Status 02/25/2022 FINAL  Final   Organism ID, Bacteria STAPHYLOCOCCUS EPIDERMIDIS  Final      Susceptibility   Staphylococcus epidermidis - MIC*    CIPROFLOXACIN >=8 RESISTANT Resistant     ERYTHROMYCIN >=8 RESISTANT Resistant     GENTAMICIN <=0.5 SENSITIVE Sensitive     OXACILLIN >=4 RESISTANT Resistant     TETRACYCLINE 2 SENSITIVE Sensitive     VANCOMYCIN 1 SENSITIVE Sensitive     TRIMETH/SULFA <=10 SENSITIVE Sensitive     CLINDAMYCIN >=8 RESISTANT Resistant     RIFAMPIN  <=0.5 SENSITIVE Sensitive     Inducible Clindamycin NEGATIVE Sensitive     *  MODERATE STAPHYLOCOCCUS EPIDERMIDIS  Gram stain     Status: None   Collection Time: 02/18/22  7:04 PM   Specimen: Fluid  Result Value Ref Range Status   Specimen Description FLUID PLEURAL  Final   Special Requests NONE  Final   Gram Stain   Final    FEW WBC PRESENT,BOTH PMN AND MONONUCLEAR NO ORGANISMS SEEN Performed at [facility], [address], [place], [place] [address]    Report Status 02/19/2022 FINAL  Final  MRSA Next Gen by PCR, Nasal     Status: None   Collection Time: 02/19/22 12:32 PM   Specimen: Nasal Mucosa; Nasal Swab  Result Value Ref Range Status   MRSA by PCR Next Gen NOT DETECTED NOT DETECTED Final    Comment: (NOTE) The GeneXpert MRSA Assay (FDA approved for NASAL specimens only), is one component of a comprehensive MRSA colonization surveillance program. It is not intended to diagnose MRSA infection nor to guide or monitor treatment for MRSA infections. Test performance is not FDA approved in patients less than 22 years old. Performed at [facility], [place] [address], [place], [place] [address]   Body fluid culture w Gram Stain     Status: None (Preliminary result)   Collection Time: 02/22/22 11:38 AM   Specimen: Pleura; Body Fluid  Result Value Ref Range Status   Specimen Description PLEURAL  Final   Special Requests LEFT  Final   Gram Stain NO WBC SEEN NO ORGANISMS SEEN   Final   Culture   Final    NO GROWTH 3 DAYS Performed at [facility], [address] [place], [place] [address]    Report Status PENDING  Incomplete    Labs: CBC: Recent Labs  Lab 02/25/22 0348 02/26/22 0400 02/27/22 0327 02/27/22 1707 02/28/22 0532 03/01/22 0319  WBC 4.4 4.8 6.1  --  6.8 5.8  HGB 7.0* 7.0* 6.1* 11.1* 10.0* 10.1*  HCT 21.1* 21.4* 18.9* 32.9* 29.7* 30.3*  MCV 77.0* 79.0* 79.4*  --  81.1 81.9  PLT 307 325 353  --  364 038*   Basic Metabolic  Panel: Recent Labs  Lab 02/25/22 0348 02/26/22 0400 02/27/22 0327 02/28/22 0532 03/01/22 0319  NA 139 139 137 139 140  K 4.6 4.1 4.3 4.2 4.0  CL 114* 113* 110 112* 113*  CO2 20* 20* 21* 22 23  GLUCOSE 183* 150* 156* 143* 124*  BUN 17 12 17 15 14   CREATININE 0.93 0.78 1.03* 0.95 0.95  CALCIUM 8.7* 8.8* 8.5* 8.7* 8.5*   Liver Function Tests: No results for input(s): "AST", "ALT", "ALKPHOS", "BILITOT", "PROT", "ALBUMIN" in the last 168 hours. CBG: Recent Labs  Lab 02/28/22 0745 02/28/22 1136 02/28/22 1618 02/28/22 2039 03/01/22 0745  GLUCAP 140* 155* 145* 157* 140*    Discharge time spent: greater than 30 minutes.  Signed: Oswald Hillock, MD [facility] 03/01/2022

## 2022-03-01 NOTE — Progress Notes (Signed)
PCCM Brief Progress Note  Discussed with pt and daughter [name]. Possible that pleurx will drain with greater fluid burden but unlikely. Does pose risk of infection and I encouraged removal now and certainly if it continues to malfunction but the patient declines removal now. Regardless, we talked about how we would need to come up with a plan together to manage recurrent MPE if she wishes to continue treatment for her breast cancer - whether that's replacement of PleurX with or without talc slurry instillation for pleurodesis, repeated thoracentesis. She wishes to keep current pleurx in place and attempt drainage at home if symptoms of reaccumulation recur. If she is unable to drain with pleurx, she and daughter will let me know and we'll plan for CXR, likely pleurx removal, and thoracentesis vs new PleurX placement depending on goals of care.   Will sign off but glad to be reinvolved as condition changes  [place]

## 2022-03-02 ENCOUNTER — Ambulatory Visit: Payer: Self-pay

## 2022-03-02 ENCOUNTER — Other Ambulatory Visit: Payer: Self-pay | Admitting: Family Medicine

## 2022-03-02 ENCOUNTER — Encounter: Payer: Self-pay | Admitting: Student

## 2022-03-02 ENCOUNTER — Telehealth: Payer: Self-pay | Admitting: Emergency Medicine

## 2022-03-02 LAB — BODY FLUID CULTURE W GRAM STAIN
Culture: NO GROWTH
Gram Stain: NONE SEEN

## 2022-03-02 NOTE — Patient Outreach (Signed)
  Care Coordination TOC Note Transition Care Management Follow-up Telephone Call Date of discharge and from where: [facility] 02/18/22-03/01/22 How have you been since you were released from the hospital? Patients daughter notes patient is having loose stools and she feels it is due to antibiotic.  Recommended she call pulmonology and let them know about it. Any questions or concerns? No  Items Reviewed: Did the pt receive and understand the discharge instructions provided? Yes  Medications obtained and verified? Yes  Other? No  Any new allergies since your discharge? No  Dietary orders reviewed? Yes Do you have support at home? Yes - Daughter [facility] and Equipment/Supplies: Were home health services ordered? yes If so, what is the name of the agency? [facility]  Has the agency set up a time to come to the patient's home? Yes- today RN scheduled to see patient Were any new equipment or medical supplies ordered?  No What is the name of the medical supply agency? N/A Were you able to get the supplies/equipment? not applicable Do you have any questions related to the use of the equipment or supplies? No  Functional Questionnaire: (I = Independent and D = Dependent) ADLs: I  Bathing/Dressing- D  Meal Prep- D  Eating- I  Maintaining continence- I  Transferring/Ambulation- I  Managing Meds- I  Follow up appointments reviewed:  PCP Hospital f/u appt confirmed? No  Specialist Hospital f/u appt confirmed? Yes  Scheduled to see Dr. Verlee Monte next week per daughter. Are transportation arrangements needed? No  If their condition worsens, is the pt aware to call PCP or go to the Emergency Dept.? Yes- Per daughter, patient will not go back to the hospital. Was the patient provided with contact information for the PCP's office or ED? Yes Was to pt encouraged to call back with questions or concerns? Yes  SDOH assessments and interventions completed:   Yes  Care Coordination  Interventions Activated:  Yes   Care Coordination Interventions:  PCP follow up appointment requested    Encounter Outcome:  Pt. Visit Completed

## 2022-03-02 NOTE — Telephone Encounter (Signed)
Copied from [place] [phone]. Topic: General - Other >> Mar 02, 2022  3:33 PM Ja-Kwan M wrote: Reason for CRM: Diane with [facility] stated they received an order for nursing but this will be a none admit. Cb# [phone]

## 2022-03-05 ENCOUNTER — Other Ambulatory Visit: Payer: Self-pay

## 2022-03-05 ENCOUNTER — Telehealth: Payer: Self-pay | Admitting: Family Medicine

## 2022-03-05 NOTE — Telephone Encounter (Signed)
Copied from [place] [phone]. Topic: Quick Communication - Home Health Verbal Orders >> Mar 05, 2022 11:32 AM Cyndi Bender wrote: Caller/Agency: Andreas Newport with [facility] Number: [phone] Requesting OT/PT/Skilled Nursing/Social Work/Speech Therapy: PT Frequency: 1 time a week for 5 weeks

## 2022-03-05 NOTE — Telephone Encounter (Signed)
Called and left VO on VM.  Advised to call office back for additional questions.

## 2022-03-05 NOTE — Telephone Encounter (Signed)
Pt's daughter returned call. °

## 2022-03-05 NOTE — Telephone Encounter (Addendum)
I called and left a message for Diane/ Alvis Lemmings regarding not opening the case, call back requested.   I then spoke to [employer] who said they did not accept the referral because the pleurex catheter was "stopped" and there was no skilled need.  The nurse made a home visit to assess the patient.   She transferred me to Emily/ Clinical Manager[facility] and I explained to her what Donata Clay said and Raquel Sarna stated that if the patient was referred for nursing and therapies and they are not providing nursing, they can't provide any services. I inquired more about this comment because it did not make sense.  Raquel Sarna also said that the patient declined all home health services. I then told her that today we received a call requesting authorization for therapy:   Mar 05, 2022 11:32 AM Cyndi Bender wrote: Caller/Agency: Andreas Newport with [facility] Number: [phone] Requesting OT/PT/Skilled Nursing/Social Work/Speech Therapy: PT Frequency: 1 time a week for 5 weeks   Raquel Sarna said that the call could not have been today because the case was closed. I explained to her again that our records have the call being received today.  Raquel Sarna said she would need to look into this more and call me back.    I received a call back from Emily[facility].  She apologized and said they made a mistake having the PT go out to the home.  They are not able to place a PT if the patient also had  nursing ordered and the patient refused nursing, so they will not be sending out a PT. She said that the patient refused all home health services.  I asked if the patient was re-referred with PT only , would they accept the referral and she said no.   I tried to contact patient and/or her daughter, [name] # [phone] to discuss her needs as Alvis Lemmings  will not be going out again. Message left with call back requested.

## 2022-03-05 NOTE — Telephone Encounter (Signed)
Can you please look into this?  If they are not able to take on the patient do we have options?  Thank you.

## 2022-03-05 NOTE — Telephone Encounter (Signed)
I spoke to patient's daughter, Heather Rogers regarding home health services.  She said her mother never refused services, the home health nurse told her that she did not qualify for services because she did not see what home health would provide. Heather Rogers is managing the pleurex catheter and she said she was managing it in Feb 08/2021 when he mother received home health services from [facility].  The PT also came out to assess her mother and Heather Rogers said that her mother was not pleased with the PT.   Heather Rogers said her mother has been eating and drinking water as well as protein supplements but her mother has not done much except sit in the chair.    I scheduled her mother with Dr Margarita Rana - 03/21/2022. Heather Rogers was in agreement to re-assessing her mother for home health services at that time, I explained that even if we refer her mother for home health services there is no guarantee that an agency will accept the referral.  They need to be in network with her insurance and have sufficient staffing available. Heather Rogers understands that she can call this clinic with questions /concerns.

## 2022-03-05 NOTE — Patient Outreach (Signed)
[facility]) Care Management  03/05/2022  Heather Rogers 28-Apr-1946 [id number]     Transition of Care Referral  Referral Date: 03/02/2022 Referral [facility] Liaison Date of Admission:02/18/2022 Diagnosis:pleural effusion Date of Discharge: 03/01/2022 [facility]   EMMI-General Discharge RED ON EMMI ALERT Day #1 Date: 03/03/2022 Red Alert Reason: "Scheduled follow-up? No"   Outreach attempt # 1 to patient. Spoke with daughter/primary caregiver-[name]. She states th patient doing fairly well. HH PT was out to see patient earlier today. HH has recommended palliative care and daughter will discuss with PCP.    Conditions: Per chart review, patient has PMH that includes but not limited to [place], left pleural effusion,CVA, HTN, anemia, CKD, HLD and DM. Patient hospitalized from 7/23-03/01/22 for pleural effusion. She has pleurex catheter which daughter is monitoring and draining as ordered. She remains minimal output of one teaspoon or less. No pain or other acute ss reported. Patient was having some diarrhea due to abx therapy but it has sine resolved.   Appts: Daughter plans to call PCP office to make follow up appt.    Medications Reviewed Today     Reviewed by Hayden Pedro, RN (Registered Nurse) on 03/05/22 at [facility] List Status: <None>   Medication Order Taking? Sig Documenting Provider Last Dose Status Informant  abemaciclib ([name]) 100 MG tablet [id number] No Take 1 tablet (100 mg total) by mouth 2 (two) times daily. Swallow tablets whole. Do not chew, crush, or split tablets before swallowing. Nicholas Lose, MD 02/18/2022 Active Pharmacy Records, Family Member, Multiple Informants  Accu-Chek FastClix Lancets [place] [id number] No Check blood sugar fasting and before meals and again if pt feels bad (symptoms of hypo). Charlott Rakes, MD Taking Active Pharmacy Records, Family Member, Multiple Informants  allopurinol  (ZYLOPRIM) 100 MG tablet [id number] No Take 1 tablet (100 mg total) by mouth daily. Charlott Rakes, MD 02/18/2022 Active Pharmacy Records, Family Member, Multiple Informants  amLODipine (NORVASC) 10 MG tablet [id number] No Take 10 mg by mouth daily. [provider] 02/18/2022 Active Pharmacy Records, Family Member, Multiple Informants  aspirin EC 81 MG tablet [id number] No Take 81 mg by mouth daily. Swallow whole. [provider] 02/18/2022 Active Pharmacy Records, Family Member, Multiple Informants  atorvastatin (LIPITOR) 80 MG tablet [id number] No TAKE ONE TABLET BY MOUTH EVERY EVENING TO LOWER CHOLESTEROL  Patient taking differently: Take 80 mg by mouth every evening.   Charlott Rakes, MD 02/17/2022 Active Pharmacy Records, Family Member, Multiple Informants  Blood Glucose Monitoring Suppl (ACCU-CHEK GUIDE) w/Device KIT [id number] No 1 each by Does not apply route as directed. Fulp, Cammie, MD Taking Active Multiple Informants, Pharmacy Records, Family Member  carvedilol (COREG) 25 MG tablet [id number] No Take 1 tablet (25 mg total) by mouth 2 (two) times daily with a meal. Charlott Rakes, MD 02/18/2022 1030 Active Pharmacy Records, Family Member, Multiple Informants  cholecalciferol (VITAMIN D3) 25 MCG (1000 UNIT) tablet [id number] No Take 1,000 Units by mouth daily. [provider] 02/18/2022 Active Multiple Informants, Pharmacy Records, Family Member  ferrous sulfate 325 (65 FE) MG EC tablet [id number] No Take 1 tablet (325 mg total) by mouth in the morning and at bedtime. Mayers, Loraine Grip, PA-C 02/18/2022 Active Multiple Informants, Pharmacy Records, Family Member  gabapentin (NEURONTIN) 100 MG capsule [id number] No Take 1 capsule (100 mg total) by mouth 3 (three) times daily. Charlott Rakes, MD 02/18/2022 Active Pharmacy Records, Family Member, Multiple Informants  glucose blood (ACCU-CHEK GUIDE) test strip [id number] No USE  TO CHECK FASTING BLOOD SUGAR AND BEFORE MEALS AND AGAIN IF  PATIENT FEELS BAD; SYMPTOMS OF HYPOGLYCEMIA Charlott Rakes, MD Taking Active Pharmacy Records, Family Member, Multiple Informants  hydrALAZINE (APRESOLINE) 50 MG tablet [id number] No Take 1 tablet (50 mg total) by mouth 3 (three) times daily. Charlott Rakes, MD 02/18/2022 Active Pharmacy Records, Family Member, Multiple Informants  letrozole [facility]) 2.5 MG tablet [id number] No Take 1 tablet (2.5 mg total) by mouth daily. Nicholas Lose, MD 02/17/2022 Active Pharmacy Records, Family Member, Multiple Informants  metFORMIN (GLUCOPHAGE) 500 MG tablet [id number] No Take 1 tablet (500 mg total) by mouth daily with breakfast. TAKE ONE TABLET BY MOUTH TWICE A DAY WITH A MEAL  Patient taking differently: Take 500 mg by mouth daily with breakfast.   Charlott Rakes, MD 02/18/2022 Active Pharmacy Records, Family Member, Multiple Informants  Multiple Vitamin (MULTIVITAMIN WITH MINERALS) TABS tablet [id number] No Take 1 tablet by mouth daily. Jonetta Osgood, MD 02/18/2022 Active Pharmacy Records, Family Member, Multiple Informants  ondansetron (ZOFRAN) 8 MG tablet [id number] No Take 1 tablet (8 mg total) by mouth every 8 (eight) hours as needed for nausea or vomiting. Nicholas Lose, MD 02/18/2022 Active Pharmacy Records, Family Member, Multiple Informants  polyethylene glycol (MIRALAX / GLYCOLAX) 17 g packet [id number] No Take 17 g by mouth daily. Nicholas Lose, MD 02/17/2022 Active Pharmacy Records, Family Member, Multiple Informants  prochlorperazine (COMPAZINE) 10 MG tablet [id number] No Take 1 tablet (10 mg total) by mouth every 6 (six) hours as needed for nausea or vomiting.  Patient not taking: Reported on 02/15/2022   Nicholas Lose, MD Not Taking Active Pharmacy Records, Family Member, Multiple Informants  Med List Note Raina Mina, RPH-CPP 02/13/22 5176): [name] filled at [facility] as [facility])               02/28/2022    8:00 AM 02/28/2022    7:34 PM 02/28/2022    7:36 PM  03/01/2022    9:00 AM 03/05/2022    2:20 PM  Fall Risk  Falls in the past year?     0  Was there an injury with Fall?     0  Fall Risk Category Calculator     0  Fall Risk Category     Low  Patient Fall Risk Level Moderate fall risk Moderate fall risk Moderate fall risk Moderate fall risk Moderate fall risk  Patient at Risk for Falls Due to     Medication side effect  Fall risk Follow up     Falls evaluation completed;Education provided    SDOH Screenings   Alcohol Screen: Not on file  Depression (PHQ2-9): Low Risk  (03/05/2022)   Depression (PHQ2-9)    PHQ-2 Score: 0  Financial Resource Strain: Not on file  Food Insecurity: No Food Insecurity (03/05/2022)   Hunger Vital Sign    Worried About Running Out of Food in the Last Year: Never true    Ran Out of Food in the Last Year: Never true  Housing: Low Risk  (03/02/2022)   Housing    Last Housing Risk Score: 0  Physical Activity: Not on file  Social Connections: Not on file  Stress: Not on file  Tobacco Use: Medium Risk (03/05/2022)   Patient History    Smoking Tobacco Use: Former    Smokeless Tobacco Use: Never    Passive Exposure: Not on file  Transportation Needs: No Transportation Needs (03/05/2022)   PRAPARE - Transportation    Lack of  Transportation (Medical): No    Lack of Transportation (Non-Medical): No       03/05/2022    2:11 PM 01/08/2022   10:44 AM 10/04/2021    9:40 AM 09/27/2021   10:30 AM 05/22/2021   11:21 AM  Depression screen PHQ 2/9  Decreased Interest 0 0 0 0 0  Down, Depressed, Hopeless 0 0 0 0 0  PHQ - 2 Score 0 0 0 0 0  Altered sleeping  0 0 0   Tired, decreased energy  0 0 0   Change in appetite  0 0 0   Feeling bad or failure about yourself   0 0 0   Trouble concentrating  0 0 0   Moving slowly or fidgety/restless  0 0 0   Suicidal thoughts  0 0 0   PHQ-9 Score  0 0 0     Care Plan : RN Care Manager POC  Updates made by Hayden Pedro, RN since 03/05/2022 12:00 AM     Problem: Chronic  Disease Mgmt of Chronic Condition-pleural effusion   Priority: High     Long-Range Goal: Development of POC for Mgmt of Chronic Condition-pleural effusion   Start Date: 03/05/2022  Expected End Date: 03/06/2023  Priority: High  Note:     Current Barriers:  Chronic Disease Management support and education needs related to pleural effusion   RNCM Clinical Goal(s):  Patient will verbalize understanding of plan for management of pleural effusion as evidenced by mgmt of chronic condition continue to work with RN Care Manager to address care management and care coordination needs related to  pleural effusion as evidenced by adherence to CM Team Scheduled appointments demonstrate ongoing self health care management ability of pleural effusion as evidenced by mgmt of pleurex cath  through collaboration with RN Care manager, provider, and care team.   Interventions: 1:1 collaboration with primary care provider regarding development and update of comprehensive plan of care as evidenced by provider attestation and co-signature Inter-disciplinary care team collaboration (see longitudinal plan of care) Evaluation of current treatment plan related to  self management and patient's adherence to plan as established by provider   Pleural effusion  (Status:  New goal.)  Long Term Goal Evaluation of current treatment plan related to  pleural effusion ,  self-management and patient's adherence to plan as established by provider. Discussed plans with patient for ongoing care management follow up and provided patient with direct contact information for care management team Evaluation of current treatment plan related to pleural effusion and patient's adherence to plan as established by provider  Patient Goals/Self-Care Activities: Take all medications as prescribed Attend all scheduled provider appointments Call provider office for new concerns or questions   Follow Up Plan:  Telephone follow up appointment  with care management team member scheduled for:  within 2-3wks The patient has been provided with contact information for the care management team and has been advised to call with any health related questions or concerns.       Consent:[facility] services reviewed and discussed with caregiver. Verbal consent for services given. Daughter works as Teacher, early years/pre and will return to work soon. Requests follow up calls between the hours of 9:30am-1:30pm when she has a break.   Plan: RN CM discussed with caregiver next outreach within . Caregiver agrees to care plan and follow up. RN CM will send barriers letter and route encounter to PCP. RN CM will send welcome letter to patient.  Enzo Montgomery, RN,BSN,CCM  [facility] Management Telephonic Care Management Coordinator Direct Phone: [phone] Toll Free: [phone] Fax: [phone]

## 2022-03-06 ENCOUNTER — Telehealth: Payer: Self-pay | Admitting: Emergency Medicine

## 2022-03-06 NOTE — Telephone Encounter (Signed)
Verbal orders were not given for patient. When called nurse states that patient refused service.

## 2022-03-06 NOTE — Telephone Encounter (Signed)
Copied from [place] [id number]. Topic: Quick Communication - Home Health Verbal Orders >> Mar 05, 2022 11:32 AM Cyndi Bender wrote: Caller/Agency: Andreas Newport with [facility] Number: [phone] Requesting OT/PT/Skilled Nursing/Social Work/Speech Therapy: PT Frequency: 1 time a week for 5 weeks

## 2022-03-09 ENCOUNTER — Other Ambulatory Visit: Payer: Self-pay | Admitting: Physician Assistant

## 2022-03-09 NOTE — Telephone Encounter (Signed)
Requested medication (s) are due for refill today: Amount not specified  Requested medication (s) are on the active medication list: yes    Last refill: 09/28/21 Amount not specified  Future visit scheduled yes 03/21/22  Notes to clinic:Historical provider, please review. Thank you.  Requested Prescriptions  Pending Prescriptions Disp Refills   amLODipine (NORVASC) 10 MG tablet [Pharmacy Med Name: amLODIPine BESYLATE 10 MG TAB] 90 tablet     Sig: TAKE ONE TABLET BY MOUTH DAILY     Cardiovascular: Calcium Channel Blockers 2 Failed - 03/09/2022  6:11 AM      Failed - Last BP in normal range    BP Readings from Last 1 Encounters:  03/01/22 (!) 174/70         Passed - Last Heart Rate in normal range    Pulse Readings from Last 1 Encounters:  03/01/22 81         Passed - Valid encounter within last 6 months    Recent Outpatient Visits           2 months ago Type 2 diabetes mellitus with diabetic mononeuropathy, without long-term current use of insulin ([place])   [place], [place], MD   5 months ago Type 2 diabetes mellitus with diabetic mononeuropathy, without long-term current use of insulin ([place])   [place], Be[place], MD   5 months ago Hypertension associated with diabetes [facility])   [place], Brady, MD   9 months ago Type 2 diabetes mellitus with diabetic mononeuropathy, without long-term current use of insulin ([place])   [place], [place], PA-C   1 year ago Type 2 diabetes mellitus with diabetic mononeuropathy, without long-term current use of insulin ([place])   [place], Charlane Ferretti, MD       Future Appointments             In 1 week Charlott Rakes, MD [place]   In 1 month Charlott Rakes, MD [place]

## 2022-03-12 ENCOUNTER — Telehealth: Payer: Self-pay | Admitting: Hematology and Oncology

## 2022-03-12 ENCOUNTER — Telehealth: Payer: Self-pay | Admitting: Pharmacist

## 2022-03-12 NOTE — Telephone Encounter (Signed)
[place]       Telephone: [phone]?Fax: [phone]   Oncology Clinical Pharmacist Practitioner Progress Note  Heather Rogers is a 76 y.o. female with a diagnosis of metastatic breast cancer currently on abemaciclib and letrozole under the care of Dr. Nicholas Lose.   Clinical pharmacy contacted Heather Rogers's daughter via telephone and verified that we were speaking to the correct person using 2 patient identifiers.  Heather Rogers explains that Heather Rogers was admitted recently on 02/18/22 due to a syncope episode.  During that time, it was found that her Pleurx catheter was not draining as well as before.  She was discharged on 03/01/22 and has been holding abemaciclib since her admission.  Heather Rogers states today that she is going to contact Dr. Verlee Monte from pulmonology because while the Pleurx catheter was draining on discharge, it is now draining less.  Clinical pharmacy explained to Heather Rogers that her mom has a follow-up appointment scheduled with Dr. Lindi Adie on 04/20/22 and we were inquiring if she would like to move the appointment up.  Heather Rogers said that would be appreciated and we have sent a scheduling message to move the labs with Dr. Lindi Adie visit from 04/20/22 to next available.  Heather Rogers knows to contact Dr. Geralyn Flash clinic should she have any questions or concerns in the interim.  Clinical pharmacy will continue to support Heather Rogers and Dr. Nicholas Lose as needed.  Raina Mina, RPH-CPP,  03/12/2022  12:46 PM   **Disclaimer: This note was dictated with voice recognition software. Similar sounding words can inadvertently be transcribed and this note may contain transcription errors which may not have been corrected upon publication of note.**

## 2022-03-12 NOTE — Telephone Encounter (Signed)
Scheduled per 8/14 in basket, pt daughter has been called and confirmed

## 2022-03-14 NOTE — Progress Notes (Signed)
Patient Care Team: Charlott Rakes, MD as PCP - General (Family Medicine) Raina Mina, RPH-CPP as Pharmacist (Hematology and Oncology) Nicholas Lose, MD as Medical Oncologist (Hematology and Oncology) Tania Ade, Tomasa Blase, RN as [place] Management  DIAGNOSIS: No diagnosis found.  SUMMARY OF ONCOLOGIC HISTORY: Oncology History  Breast mass, left  09/04/2021 Initial Diagnosis   Breast mass, left   Primary malignant neoplasm of breast with metastasis ([place])  09/14/2021 Initial Diagnosis   Large neglected left breast cancer for several years.  Hospitalization 09/02/2021-09/08/2021: Shortness of breath, large pleural effusion, thoracentesis, pleural fluid cytology: Metastatic adenocarcinoma breast origin ER/PR positive HER2 negative     CHIEF COMPLIANT: Left breast mass  INTERVAL HISTORY: Heather Rogers is a 76 y.o. with above-mentioned history of left breast mass, bone lesions, pulmonary nodules, pleural effusion. She presents to the clinic today for follow-up    ALLERGIES:  is allergic to sulfa antibiotics.  MEDICATIONS:  Current Outpatient Medications  Medication Sig Dispense Refill   abemaciclib ([name]) 100 MG tablet Take 1 tablet (100 mg total) by mouth 2 (two) times daily. Swallow tablets whole. Do not chew, crush, or split tablets before swallowing. 56 tablet 3   Accu-Chek FastClix Lancets MISC Check blood sugar fasting and before meals and again if pt feels bad (symptoms of hypo). 100 each 12   allopurinol (ZYLOPRIM) 100 MG tablet Take 1 tablet (100 mg total) by mouth daily. 90 tablet 1   amLODipine (NORVASC) 10 MG tablet TAKE ONE TABLET BY MOUTH DAILY 90 tablet 0   aspirin EC 81 MG tablet Take 81 mg by mouth daily. Swallow whole.     atorvastatin (LIPITOR) 80 MG tablet TAKE ONE TABLET BY MOUTH EVERY EVENING TO LOWER CHOLESTEROL (Patient taking differently: Take 80 mg by mouth every evening.) 90 tablet 1   Blood Glucose Monitoring Suppl  (ACCU-CHEK GUIDE) w/Device KIT 1 each by Does not apply route as directed. 1 kit 0   carvedilol (COREG) 25 MG tablet Take 1 tablet (25 mg total) by mouth 2 (two) times daily with a meal. 180 tablet 1   cholecalciferol (VITAMIN D3) 25 MCG (1000 UNIT) tablet Take 1,000 Units by mouth daily.     ferrous sulfate 325 (65 FE) MG EC tablet Take 1 tablet (325 mg total) by mouth in the morning and at bedtime. 60 tablet 1   gabapentin (NEURONTIN) 100 MG capsule Take 1 capsule (100 mg total) by mouth 3 (three) times daily. 270 capsule 1   glucose blood (ACCU-CHEK GUIDE) test strip USE TO CHECK FASTING BLOOD SUGAR AND BEFORE MEALS AND AGAIN IF PATIENT FEELS BAD; SYMPTOMS OF HYPOGLYCEMIA 100 strip 11   hydrALAZINE (APRESOLINE) 50 MG tablet Take 1 tablet (50 mg total) by mouth 3 (three) times daily. 270 tablet 1   letrozole (FEMARA) 2.5 MG tablet Take 1 tablet (2.5 mg total) by mouth daily. 90 tablet 3   metFORMIN (GLUCOPHAGE) 500 MG tablet Take 1 tablet (500 mg total) by mouth daily with breakfast. TAKE ONE TABLET BY MOUTH TWICE A DAY WITH A MEAL (Patient taking differently: Take 500 mg by mouth daily with breakfast.) 90 tablet 1   Multiple Vitamin (MULTIVITAMIN WITH MINERALS) TABS tablet Take 1 tablet by mouth daily. 30 tablet 0   ondansetron (ZOFRAN) 8 MG tablet Take 1 tablet (8 mg total) by mouth every 8 (eight) hours as needed for nausea or vomiting. 30 tablet 2   polyethylene glycol (MIRALAX / GLYCOLAX) 17 g packet Take 17 g  by mouth daily.     prochlorperazine (COMPAZINE) 10 MG tablet Take 1 tablet (10 mg total) by mouth every 6 (six) hours as needed for nausea or vomiting. (Patient not taking: Reported on 02/15/2022) 30 tablet 2   No current facility-administered medications for this visit.    PHYSICAL EXAMINATION: ECOG PERFORMANCE STATUS: {CHL ONC ECOG PS:[id number]}  There were no vitals filed for this visit. There were no vitals filed for this visit.  BREAST:*** No palpable masses or nodules in  either right or left breasts. No palpable axillary supraclavicular or infraclavicular adenopathy no breast tenderness or nipple discharge. (exam performed in the presence of a chaperone)  LABORATORY DATA:  I have reviewed the data as listed    Latest Ref Rng & Units 03/01/2022    3:19 AM 02/28/2022    5:32 AM 02/27/2022    3:27 AM  CMP  Glucose 70 - 99 mg/dL 124  143  156   BUN 8 - 23 mg/dL 14  15  17    Creatinine 0.44 - 1.00 mg/dL 0.95  0.95  1.03   Sodium 135 - 145 mmol/L 140  139  137   Potassium 3.5 - 5.1 mmol/L 4.0  4.2  4.3   Chloride 98 - 111 mmol/L 113  112  110   CO2 22 - 32 mmol/L 23  22  21    Calcium 8.9 - 10.3 mg/dL 8.5  8.7  8.5     Lab Results  Component Value Date   WBC 5.8 03/01/2022   HGB 10.1 (L) 03/01/2022   HCT 30.3 (L) 03/01/2022   MCV 81.9 03/01/2022   PLT 440 (H) 03/01/2022   NEUTROABS 6.0 02/18/2022    ASSESSMENT & PLAN:  No problem-specific Assessment & Plan notes found for this encounter.    No orders of the defined types were placed in this encounter.  The patient has a good understanding of the overall plan. she agrees with it. she will call with any problems that may develop before the next visit here. Total time spent: 30 mins including face to face time and time spent for planning, charting and co-ordination of care   Suzzette Righter, [place] 03/14/22    I Gardiner Coins am scribing for Dr. Lindi Adie  ***

## 2022-03-15 ENCOUNTER — Inpatient Hospital Stay: Payer: Medicare HMO | Attending: Hematology and Oncology

## 2022-03-15 ENCOUNTER — Telehealth: Payer: Self-pay | Admitting: *Deleted

## 2022-03-15 ENCOUNTER — Inpatient Hospital Stay (HOSPITAL_BASED_OUTPATIENT_CLINIC_OR_DEPARTMENT_OTHER): Payer: Medicare HMO | Admitting: Hematology and Oncology

## 2022-03-15 ENCOUNTER — Other Ambulatory Visit: Payer: Self-pay

## 2022-03-15 DIAGNOSIS — Z170 Estrogen receptor positive status [ER+]: Secondary | ICD-10-CM | POA: Diagnosis not present

## 2022-03-15 DIAGNOSIS — C50912 Malignant neoplasm of unspecified site of left female breast: Secondary | ICD-10-CM | POA: Insufficient documentation

## 2022-03-15 DIAGNOSIS — C50919 Malignant neoplasm of unspecified site of unspecified female breast: Secondary | ICD-10-CM

## 2022-03-15 DIAGNOSIS — J90 Pleural effusion, not elsewhere classified: Secondary | ICD-10-CM

## 2022-03-15 DIAGNOSIS — R918 Other nonspecific abnormal finding of lung field: Secondary | ICD-10-CM | POA: Insufficient documentation

## 2022-03-15 DIAGNOSIS — C7951 Secondary malignant neoplasm of bone: Secondary | ICD-10-CM | POA: Diagnosis not present

## 2022-03-15 DIAGNOSIS — E041 Nontoxic single thyroid nodule: Secondary | ICD-10-CM | POA: Insufficient documentation

## 2022-03-15 DIAGNOSIS — R079 Chest pain, unspecified: Secondary | ICD-10-CM

## 2022-03-15 LAB — CBC WITH DIFFERENTIAL (CANCER CENTER ONLY)
Abs Immature Granulocytes: 0.04 10*3/uL (ref 0.00–0.07)
Basophils Absolute: 0 10*3/uL (ref 0.0–0.1)
Basophils Relative: 0 %
Eosinophils Absolute: 0.1 10*3/uL (ref 0.0–0.5)
Eosinophils Relative: 1 %
HCT: 32.7 % — ABNORMAL LOW (ref 36.0–46.0)
Hemoglobin: 10.8 g/dL — ABNORMAL LOW (ref 12.0–15.0)
Immature Granulocytes: 1 %
Lymphocytes Relative: 15 %
Lymphs Abs: 1 10*3/uL (ref 0.7–4.0)
MCH: 26.6 pg (ref 26.0–34.0)
MCHC: 33 g/dL (ref 30.0–36.0)
MCV: 80.5 fL (ref 80.0–100.0)
Monocytes Absolute: 0.3 10*3/uL (ref 0.1–1.0)
Monocytes Relative: 5 %
Neutro Abs: 5.3 10*3/uL (ref 1.7–7.7)
Neutrophils Relative %: 78 %
Platelet Count: 299 10*3/uL (ref 150–400)
RBC: 4.06 MIL/uL (ref 3.87–5.11)
RDW: 15.2 % (ref 11.5–15.5)
WBC Count: 6.8 10*3/uL (ref 4.0–10.5)
nRBC: 0 % (ref 0.0–0.2)

## 2022-03-15 LAB — CMP (CANCER CENTER ONLY)
ALT: 25 U/L (ref 0–44)
AST: 24 U/L (ref 15–41)
Albumin: 2.9 g/dL — ABNORMAL LOW (ref 3.5–5.0)
Alkaline Phosphatase: 51 U/L (ref 38–126)
Anion gap: 10 (ref 5–15)
BUN: 28 mg/dL — ABNORMAL HIGH (ref 8–23)
CO2: 26 mmol/L (ref 22–32)
Calcium: 10 mg/dL (ref 8.9–10.3)
Chloride: 101 mmol/L (ref 98–111)
Creatinine: 1.46 mg/dL — ABNORMAL HIGH (ref 0.44–1.00)
GFR, Estimated: 37 mL/min — ABNORMAL LOW (ref >60)
Glucose, Bld: 131 mg/dL — ABNORMAL HIGH (ref 70–99)
Potassium: 4.3 mmol/L (ref 3.5–5.1)
Sodium: 137 mmol/L (ref 135–145)
Total Bilirubin: 0.2 mg/dL — ABNORMAL LOW (ref 0.3–1.2)
Total Protein: 8 g/dL (ref 6.5–8.1)

## 2022-03-15 MED ORDER — ABEMACICLIB 50 MG PO TABS: 50.0000 mg | ORAL_TABLET | Freq: Two times a day (BID) | ORAL | Status: DC

## 2022-03-15 NOTE — Telephone Encounter (Signed)
[employer] and discussed. Getting CXR tomorrow at 36. Will review later in the day (on nights currently). Will try to schedule for PleurX removal as outpatient and thora vs new PleurX after reviewing CXR.   [place]

## 2022-03-15 NOTE — Telephone Encounter (Signed)
-----   Message from Nicholas Lose, MD sent at 03/15/2022  2:42 PM EDT ----- Regarding: Pleurex Heather Rogers, I saw Heather Rogers Her pleurex is not working. Can you please see her to see if it needs to be removed or adjusted? She wants it out Thanks [employer]

## 2022-03-15 NOTE — Telephone Encounter (Signed)
Called and spoke with patient's daughter, [name] [facility]) regarding pleurex catheter.  Advised that according to the notes from Dr. Verlee Monte, he had recommended that the catheter be removed as it was not draining, however, the patient declined.  She is currently complaining of pain and catheter is still not draining.  I advised that our NP wants her to come in the office tomorrow for a cxr and then based on the results, she may need to be set up for a thoracentesis either in IR at the hospital or by a provider at the hospital.  She verbalized understanding and she will have the patient here by 9 am for a cxr.  Advised to let the staff at the front desk know that she needs a cxr and the radiology tech will come get her and get it done.  Advised I also messaged Dr. Verlee Monte as well.

## 2022-03-15 NOTE — Assessment & Plan Note (Signed)
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 Hospitalization 09/02/2021-09/08/2021: Shortness of breath, large pleural effusion, thoracentesis, pleural fluid cytology: Metastatic adenocarcinoma breast origin ER 40%, PR 30%, Ki-67 5%, HER2 2+ by IHC, FISH negative ratio 1.43   Current treatment: [name] with letrozole   Toxicities: No major side effects to treatment. 1. She gets constipated regularly. 2. Mild nausea for which she uses Zofran 3.  Diminished appetite  01/15/22: Ct Chest: Large ulcerated breast mass slightly decreased in size. Axill and left IM LN dec in size, Multiple pulmonary and pleural nodules smaller, unchaged sternal met, and left 3 and 4 ribs  Thyroid nodule: We discussed the risks and benefits of obtaining a biopsy.  She is not keen on doing it at this time.  02/22/2022: CT chest: Decreased volume of the pleural fluid.  Unchanged breast mass otherwise stable bone metastasis.  A noncontrasted could not see the lymph nodes very well.  Continue current treatment Recheck with scans in 3 months

## 2022-03-16 ENCOUNTER — Ambulatory Visit (INDEPENDENT_AMBULATORY_CARE_PROVIDER_SITE_OTHER): Payer: Medicare HMO

## 2022-03-16 ENCOUNTER — Telehealth: Payer: Self-pay | Admitting: Hematology and Oncology

## 2022-03-16 DIAGNOSIS — R079 Chest pain, unspecified: Secondary | ICD-10-CM | POA: Diagnosis not present

## 2022-03-16 DIAGNOSIS — J90 Pleural effusion, not elsewhere classified: Secondary | ICD-10-CM | POA: Diagnosis not present

## 2022-03-16 NOTE — Telephone Encounter (Signed)
Scheduled appointment per 8/17 los. Talked with the patients daughter [name] and she is aware.

## 2022-03-19 ENCOUNTER — Encounter (HOSPITAL_COMMUNITY)
Admission: RE | Disposition: A | Discharge status: Home / Self Care | Payer: Self-pay | Source: Home / Self Care | Attending: Student

## 2022-03-19 ENCOUNTER — Encounter (HOSPITAL_COMMUNITY): Payer: Self-pay | Admitting: Student

## 2022-03-19 ENCOUNTER — Ambulatory Visit (HOSPITAL_COMMUNITY)
Admission: RE | Admit: 2022-03-19 | Discharge: 2022-03-19 | Disposition: A | Discharge status: Home / Self Care | Payer: Medicare HMO | Attending: Student | Admitting: Student

## 2022-03-19 DIAGNOSIS — C7931 Secondary malignant neoplasm of brain: Secondary | ICD-10-CM | POA: Insufficient documentation

## 2022-03-19 DIAGNOSIS — J90 Pleural effusion, not elsewhere classified: Secondary | ICD-10-CM

## 2022-03-19 DIAGNOSIS — I129 Hypertensive chronic kidney disease with stage 1 through stage 4 chronic kidney disease, or unspecified chronic kidney disease: Secondary | ICD-10-CM | POA: Diagnosis not present

## 2022-03-19 DIAGNOSIS — E1122 Type 2 diabetes mellitus with diabetic chronic kidney disease: Secondary | ICD-10-CM | POA: Diagnosis not present

## 2022-03-19 DIAGNOSIS — J910 Malignant pleural effusion: Secondary | ICD-10-CM | POA: Diagnosis not present

## 2022-03-19 DIAGNOSIS — C50912 Malignant neoplasm of unspecified site of left female breast: Secondary | ICD-10-CM | POA: Insufficient documentation

## 2022-03-19 DIAGNOSIS — N183 Chronic kidney disease, stage 3 unspecified: Secondary | ICD-10-CM | POA: Insufficient documentation

## 2022-03-19 HISTORY — PX: CHEST TUBE INSERTION: SHX231

## 2022-03-19 SURGERY — INSERTION, PLEURAL DRAINAGE CATHETER

## 2022-03-19 MED ORDER — LIDOCAINE HCL 2 % IJ SOLN: 20.0000 mL | Freq: Once | INTRAMUSCULAR | Status: DC

## 2022-03-19 MED ORDER — FENTANYL CITRATE (PF) 100 MCG/2ML IJ SOLN
INTRAMUSCULAR | Status: AC
  Filled 2022-03-19: qty 2

## 2022-03-19 MED ORDER — LIDOCAINE HCL (PF) 2 % IJ SOLN
INTRAMUSCULAR | Status: DC | PRN
  Administered 2022-03-19: 12 mL via INTRADERMAL

## 2022-03-19 MED ORDER — LIDOCAINE HCL 2 % IJ SOLN
INTRAMUSCULAR | Status: AC
  Filled 2022-03-19: qty 40

## 2022-03-19 NOTE — Interval H&P Note (Signed)
History and Physical Interval Note:  03/19/2022 3:04 PM  Heather Rogers  has presented today for surgery, with the diagnosis of removal of pleural drianage catheter.  The various methods of treatment have been discussed with the patient and family. After consideration of risks, benefits and other options for treatment, the patient has consented to  Procedure(s): REMOVAL PLEURAL DRAINAGE CATHETER (N/A) as a surgical intervention.  The patient's history has been reviewed, patient examined, no change in status, stable for surgery.  I have reviewed the patient's chart and labs.  Questions were answered to the patient's satisfaction.     Maryjane Hurter

## 2022-03-19 NOTE — Discharge Instructions (Signed)
Would leave current dressing in place for 2 days. Would try to avoid soaking it in any water until then.

## 2022-03-19 NOTE — Op Note (Signed)
Tunneled Pleural Catheter Removal  Site prepped with Chloraprep. Anesthetized with 12cc 2% lidocaine infiltration. Dissected along cuff with forceps, once cuff free, traction applied to catheter which was removed without difficulty. Gauze and tegaderm applied. [place] of pleural space afterward revealed likely clot probably related to prior tPA administration with subsequent bloody effusion during recent admission and only trace remaining free flowing effusion, thoracentesis deferred.  [place]

## 2022-03-20 ENCOUNTER — Other Ambulatory Visit: Payer: Self-pay

## 2022-03-20 NOTE — Patient Outreach (Signed)
[facility]) Care Management  03/20/2022  Heather Rogers Jul 17, 1946 [id number]   Telephone Assessment   No answer. RN CM left HIPAA compliant voicemail message along with contact info.     Plan:  RN CM will make outreach attempt to patient within 4 business days.   Enzo Montgomery, RN,BSN,CCM [place] Management Telephonic Care Management Coordinator Direct Phone: [phone] Toll Free: [phone] Fax: [phone]

## 2022-03-21 ENCOUNTER — Encounter (HOSPITAL_COMMUNITY): Payer: Self-pay | Admitting: Student

## 2022-03-21 ENCOUNTER — Telehealth: Payer: Self-pay

## 2022-03-21 ENCOUNTER — Ambulatory Visit: Payer: Medicare HMO | Attending: Family Medicine | Admitting: Family Medicine

## 2022-03-21 VITALS — BP 139/72 | HR 77 | Temp 97.9°F | Resp 14 | Ht 62.0 in | Wt 130.0 lb

## 2022-03-21 DIAGNOSIS — R5381 Other malaise: Secondary | ICD-10-CM

## 2022-03-21 DIAGNOSIS — E1159 Type 2 diabetes mellitus with other circulatory complications: Secondary | ICD-10-CM | POA: Diagnosis not present

## 2022-03-21 DIAGNOSIS — C50919 Malignant neoplasm of unspecified site of unspecified female breast: Secondary | ICD-10-CM

## 2022-03-21 DIAGNOSIS — I152 Hypertension secondary to endocrine disorders: Secondary | ICD-10-CM | POA: Diagnosis not present

## 2022-03-21 DIAGNOSIS — E1141 Type 2 diabetes mellitus with diabetic mononeuropathy: Secondary | ICD-10-CM

## 2022-03-21 NOTE — Patient Instructions (Signed)
Deconditioning Deconditioning refers to the changes in the body that occur during a period of time when you are not active (inactivity). The changes happen in the heart, lungs, and muscles. They make you feel tired and weak (fatigued) and decrease your ability to be active. The three stages of deconditioning include: Mild deconditioning. This is a change in your ability to do your usual exercise activities, such as running, biking, or swimming. Moderate deconditioning. This is a change in your ability to do normal everyday activities, such as walking, shopping for groceries, and doing chores. Severe deconditioning. In this stage, you may not be able to do minimal activity or usual self-care. What are the causes? Deconditioning can occur after only a few days of inactivity. The longer the period of inactivity, the more severe the deconditioning will be, and the longer it will take to return to your previous level of functioning. It can take three times longer to recover than the time period of inactivity. Deconditioning is caused by inactivity, often due to: Illnesses, such as cancer, stroke, heart attack, fibromyalgia, and chronic fatigue syndrome. Injuries, especially back injuries, broken bones, and injuries to soft tissues, such as ligaments and tendons. Hospitalization, even for just a day or two. Pregnancy, especially if long periods of bed rest are needed. What increases the risk? The following factors may make you more likely to develop this condition: Having obesity or poor nutrition. Having poor mobility before the period of inactivity. Being an older adult. Having depression. Having problems with thinking and learning (cognitive impairment). What are the signs or symptoms? Symptoms of this condition include: Weakness and tiredness. Shortness of breath with minor physical effort (exertion). A heartbeat that is faster than normal. You may not notice this without taking your pulse. Pain  or discomfort with activity. Decreased strength, endurance, and balance. Difficulty doing your usual forms of exercise. Difficulty doing activities of daily living, such as grocery shopping or chores. You may also have problems walking around the house and doing basic self-care, such as getting to the bathroom, preparing meals, or doing laundry. How is this diagnosed? This condition is diagnosed based on your medical history and a physical exam. During the physical exam, your health care provider will check for signs of deconditioning, such as: Decreased size of muscles. Decreased strength. Trouble with balance. Shortness of breath or a heart rate that is faster than normal after minor exertion. How is this treated? Treatment for this condition involves an exercise program in which activity is increased slowly. Your health care provider will tell you which exercises are right for you. The exercise program will likely include: Aerobic exercise. This type of exercise helps improve the functioning of the heart, lungs, and muscles. Strength training. This type of exercise helps increase muscle size and strength. Both of these types of exercise will improve your endurance. You may be referred to a physical therapist who can create a safe strengthening program for you to follow. Follow these instructions at home: Eating and drinking  Eat a healthy, well-balanced diet. This includes: Proteins, such as lean meats and fish, to build muscles. Fresh fruits and vegetables. Carbohydrates, such as whole grains, to boost energy. Drink enough fluid to keep your urine pale yellow. Activity  Follow the exercise program that is recommended by your health care provider or physical therapist. Do not increase your exercise any faster than directed. General instructions Take over-the-counter and prescription medicines only as told by your health care provider. Do not use any   products that contain nicotine or  tobacco. These products include cigarettes, chewing tobacco, and vaping devices, such as e-cigarettes. If you need help quitting, ask your health care provider. Keep all follow-up visits. This is important. Contact a health care provider if: You are not able to do the recommended exercise program. You are becoming more and more tired and weak. You become light-headed when rising to a sitting or standing position. Your level of endurance decreases after it has improved. Get help right away if: You have chest pain. You are very short of breath. You have any episodes of fainting. These symptoms may be an emergency. Get help right away. Call 911. Do not wait to see if the symptoms will go away. Do not drive yourself to the hospital. Summary Deconditioning refers to the changes in the body that occur during a period of inactivity. Deconditioning happens in the heart, lungs, and muscles. The changes make you feel tired and weak and decrease your ability to be active. Treatment for deconditioning involves an exercise program in which activity is increased slowly. This information is not intended to replace advice given to you by your health care provider. Make sure you discuss any questions you have with your health care provider. Document Revised: 05/08/2021 Document Reviewed: 05/08/2021 Elsevier Patient Education  2023 Elsevier Inc.  

## 2022-03-21 NOTE — Progress Notes (Signed)
Subjective:  Patient ID: Heather Rogers, female    DOB: 03-Nov-1945  Age: 76 y.o. MRN: [id number]  CC: Hospitalization Follow-up (Catheter removal)   HPI Heather Rogers is a 76 y.o. year old female with a history of type 2 diabetes mellitus (A1c 5.5), hypertension, gout, COVID-19 in 06/2020,, history of CVA, status post loop recorder placement in 2019, metastatic L breast cancer (pulmonary and bony metastasis) She is accompanied by her daughter to today's visit.  Hospitalized 02/18/2022 through 03/01/2022 due to syncope after she presented to the ED with increased output from her pleural catheter, fever and syncope. Imaging revealed left lung atelectasis, large pleural effusion treated with left chest tube.  Treated with antibiotics during her hospital stay.  She also received 2 units of PRBC.  Syncope was thought to be vasovagal.  Her pleural catheter was removed by pulmonary 2 days ago.  Interval History: Last seen by Oncology last week and is on [name] and letrozole and her [name] dose was decreased at her last visit.  Daughter states the patient had started getting disoriented and lethargic after her [name] dose had been increased prior to hospitalization but now with the decrease she is back to baseline.  BP at home have been 129/60- 142/63, she has a single value of 150/64 Fasting sugars are are in the 90s. She has a poor appetite and has just been ingesting Ensure.  She was initially constipated but daughter administered magnesium citrate which provided some relief. She would like services but not with [facility] as they had reported wrongly that her mother had declined services but per the patient's daughter she never did so.  Physical Therapy was also stopped and daughter states she just lays on the couch and daughter would like services restarted. Past Medical History:  Diagnosis Date   Chronic kidney disease, stage 3b ([place])    Diabetes ([place])    Hypertension     Pneumonia due to COVID-19 virus 07/25/2020   Primary malignant neoplasm of breast with metastasis ([place]) 09/14/2021   Stroke (cerebrum) ([place])    Stroke ([place]) 05/15/2018    Past Surgical History:  Procedure Laterality Date   CHEST TUBE INSERTION Left 09/07/2021   Procedure: CHEST TUBE INSERTION;  Surgeon: Candee Furbish, MD;  Location[facility] ENDOSCOPY;  Service: Pulmonary;  Laterality: Left;   CHEST TUBE INSERTION N/A 03/19/2022   Procedure: REMOVAL PLEURAL DRAINAGE CATHETER;  Surgeon: Maryjane Hurter, MD;  Location: [facility] ENDOSCOPY;  Service: Pulmonary;  Laterality: N/A;   EYE SURGERY Left 04/06/2020   Implant placed   EYE SURGERY Right 03/30/2020   Implant placed    hysterectomy     LOOP RECORDER INSERTION N/A 05/20/2018   Procedure: LOOP RECORDER INSERTION;  Surgeon: Thompson Grayer, MD;  Location: [facility] CV LAB;  Service: Cardiovascular;  Laterality: N/A;   TEE WITHOUT CARDIOVERSION N/A 05/20/2018   Procedure: TRANSESOPHAGEAL ECHOCARDIOGRAM (TEE);  Surgeon: Sueanne Margarita, MD;  Location: [facility] ENDOSCOPY;  Service: Cardiovascular;  Laterality: N/A;    Family History  Problem Relation Age of Onset   Hypertension Mother    Stroke Mother    Hypertension Father     Social History   Socioeconomic History   Marital status: Divorced    Spouse name: Not on file   Number of children: Not on file   Years of education: Not on file   Highest education level: Not on file  Occupational History   Not on file  Tobacco Use   Smoking status:  Former    Packs/day: 1.00    Years: 15.00    Total pack years: 15.00    Types: Cigarettes    Quit date: 07/30/1985    Years since quitting: 36.6   Smokeless tobacco: Never  Vaping Use   Vaping Use: Never used  Substance and Sexual Activity   Alcohol use: Never   Drug use: Never   Sexual activity: Not Currently  Other Topics Concern   Not on file  Social History Narrative   Lives in [place].   Social Determinants of Health   Financial  Resource Strain: Not on file  Food Insecurity: No Food Insecurity (03/05/2022)   Hunger Vital Sign    Worried About Running Out of Food in the Last Year: Never true    Ran Out of Food in the Last Year: Never true  Transportation Needs: No Transportation Needs (03/05/2022)   PRAPARE - Hydrologist (Medical): No    Lack of Transportation (Non-Medical): No  Physical Activity: Not on file  Stress: Not on file  Social Connections: Not on file    Allergies  Allergen Reactions   Sulfa Antibiotics Itching    Outpatient Medications Prior to Visit  Medication Sig Dispense Refill   abemaciclib ([name]) 50 MG tablet Take 1 tablet (50 mg total) by mouth 2 (two) times daily. Swallow tablets whole. Do not chew, crush, or split tablets before swallowing.     Accu-Chek FastClix Lancets MISC Check blood sugar fasting and before meals and again if pt feels bad (symptoms of hypo). 100 each 12   allopurinol (ZYLOPRIM) 100 MG tablet Take 1 tablet (100 mg total) by mouth daily. 90 tablet 1   amLODipine (NORVASC) 10 MG tablet TAKE ONE TABLET BY MOUTH DAILY 90 tablet 0   aspirin EC 81 MG tablet Take 81 mg by mouth in the morning. Swallow whole.     atorvastatin (LIPITOR) 80 MG tablet TAKE ONE TABLET BY MOUTH EVERY EVENING TO LOWER CHOLESTEROL (Patient taking differently: Take 80 mg by mouth every evening.) 90 tablet 1   Blood Glucose Monitoring Suppl (ACCU-CHEK GUIDE) w/Device KIT 1 each by Does not apply route as directed. 1 kit 0   carvedilol (COREG) 25 MG tablet Take 1 tablet (25 mg total) by mouth 2 (two) times daily with a meal. 180 tablet 1   cholecalciferol (VITAMIN D3) 25 MCG (1000 UNIT) tablet Take 1,000 Units by mouth daily.     ferrous sulfate 325 (65 FE) MG EC tablet Take 1 tablet (325 mg total) by mouth in the morning and at bedtime. 60 tablet 1   gabapentin (NEURONTIN) 100 MG capsule Take 1 capsule (100 mg total) by mouth 3 (three) times daily. 270 capsule 1   glucose  blood (ACCU-CHEK GUIDE) test strip USE TO CHECK FASTING BLOOD SUGAR AND BEFORE MEALS AND AGAIN IF PATIENT FEELS BAD; SYMPTOMS OF HYPOGLYCEMIA 100 strip 11   hydrALAZINE (APRESOLINE) 50 MG tablet Take 1 tablet (50 mg total) by mouth 3 (three) times daily. 270 tablet 1   letrozole (FEMARA) 2.5 MG tablet Take 1 tablet (2.5 mg total) by mouth daily. (Patient taking differently: Take 2.5 mg by mouth every evening.) 90 tablet 3   metFORMIN (GLUCOPHAGE) 500 MG tablet Take 1 tablet (500 mg total) by mouth daily with breakfast. TAKE ONE TABLET BY MOUTH TWICE A DAY WITH A MEAL (Patient taking differently: Take 500 mg by mouth daily with breakfast.) 90 tablet 1   Multiple Vitamin (MULTIVITAMIN WITH  MINERALS) TABS tablet Take 1 tablet by mouth daily. 30 tablet 0   ondansetron (ZOFRAN) 8 MG tablet Take 1 tablet (8 mg total) by mouth every 8 (eight) hours as needed for nausea or vomiting. 30 tablet 2   polyethylene glycol (MIRALAX / GLYCOLAX) 17 g packet Take 17 g by mouth daily as needed (constipation.).     prochlorperazine (COMPAZINE) 10 MG tablet Take 1 tablet (10 mg total) by mouth every 6 (six) hours as needed for nausea or vomiting. 30 tablet 2   No facility-administered medications prior to visit.     ROS Review of Systems  Constitutional:  Positive for appetite change. Negative for activity change.  HENT:  Negative for sinus pressure and sore throat.   Respiratory:  Negative for chest tightness, shortness of breath and wheezing.   Cardiovascular:  Negative for chest pain and palpitations.  Gastrointestinal:  Negative for abdominal distention, abdominal pain and constipation.  Genitourinary: Negative.   Musculoskeletal: Negative.   Psychiatric/Behavioral:  Negative for behavioral problems and dysphoric mood.     Objective:  BP 139/72 (BP Location: Right Arm, Patient Position: Sitting, Cuff Size: Normal)   Pulse 77   Temp 97.9 F (36.6 C)   Resp 14   Ht 5' 2"  (1.575 m)   Wt 130 lb (59 kg)    SpO2 99%   BMI 23.78 kg/m      03/21/2022   10:10 AM 03/19/2022    2:45 PM 03/19/2022    2:03 PM  BP/Weight  Systolic BP 876 811 572  Diastolic BP 72 55 67  Wt. (Lbs) 130    BMI 23.78 kg/m2        Physical Exam Constitutional:      Appearance: She is well-developed.  Cardiovascular:     Rate and Rhythm: Normal rate.     Heart sounds: Normal heart sounds. No murmur heard. Pulmonary:     Effort: Pulmonary effort is normal.     Breath sounds: Normal breath sounds. No wheezing or rales.  Chest:     Chest wall: No tenderness.  Abdominal:     General: Bowel sounds are normal. There is no distension.     Palpations: Abdomen is soft. There is no mass.     Tenderness: There is no abdominal tenderness.  Musculoskeletal:        General: Normal range of motion.     Right lower leg: No edema.     Left lower leg: No edema.  Neurological:     Mental Status: She is alert and oriented to person, place, and time.  Psychiatric:        Mood and Affect: Mood normal.        Latest Ref Rng & Units 03/15/2022    1:30 PM 03/01/2022    3:19 AM 02/28/2022    5:32 AM  CMP  Glucose 70 - 99 mg/dL 131  124  143   BUN 8 - 23 mg/dL 28  14  15    Creatinine 0.44 - 1.00 mg/dL 1.46  0.95  0.95   Sodium 135 - 145 mmol/L 137  140  139   Potassium 3.5 - 5.1 mmol/L 4.3  4.0  4.2   Chloride 98 - 111 mmol/L 101  113  112   CO2 22 - 32 mmol/L 26  23  22    Calcium 8.9 - 10.3 mg/dL 10.0  8.5  8.7   Total Protein 6.5 - 8.1 g/dL 8.0     Total Bilirubin 0.3 -  1.2 mg/dL 0.2     Alkaline Phos 38 - 126 U/L 51     AST 15 - 41 U/L 24     ALT 0 - 44 U/L 25       Lipid Panel     Component Value Date/Time   CHOL 115 05/24/2021 1003   TRIG 105 05/24/2021 1003   HDL 38 (L) 05/24/2021 1003   CHOLHDL 3.0 05/24/2021 1003   CHOLHDL 4.7 05/16/2018 0420   VLDL 35 05/16/2018 0420   LDLCALC 57 05/24/2021 1003    CBC    Component Value Date/Time   WBC 6.8 03/15/2022 1330   WBC 5.8 03/01/2022 0319   RBC 4.06  03/15/2022 1330   HGB 10.8 (L) 03/15/2022 1330   HGB 10.3 (L) 05/24/2021 1003   HCT 32.7 (L) 03/15/2022 1330   HCT 32.4 (L) 05/24/2021 1003   PLT 299 03/15/2022 1330   PLT 345 05/24/2021 1003   MCV 80.5 03/15/2022 1330   MCV 72 (L) 05/24/2021 1003   MCH 26.6 03/15/2022 1330   MCHC 33.0 03/15/2022 1330   RDW 15.2 03/15/2022 1330   RDW 14.4 05/24/2021 1003   LYMPHSABS 1.0 03/15/2022 1330   LYMPHSABS 1.6 05/24/2021 1003   MONOABS 0.3 03/15/2022 1330   EOSABS 0.1 03/15/2022 1330   EOSABS 0.1 05/24/2021 1003   BASOSABS 0.0 03/15/2022 1330   BASOSABS 0.0 05/24/2021 1003    Lab Results  Component Value Date   HGBA1C 5.5 01/08/2022    Assessment & Plan:  1. Type 2 diabetes mellitus with diabetic mononeuropathy, without long-term current use of insulin (HCC) Controlled with A1c of 5.5 Continue metformin Counseled on Diabetic diet, my plate method, 338 minutes of moderate intensity exercise/week Blood sugar logs with fasting goals of 80-120 mg/dl, random of less than 180 and in the event of sugars less than 60 mg/dl or greater than 400 mg/dl encouraged to notify the clinic. Advised on the need for annual eye exams, annual foot exams, Pneumonia vaccine.   2. Hypertension associated with diabetes ([place]) Slightly above goal In the setting of underlying comorbidities we will hold off on adjusting regimen Counseled on blood pressure goal of less than 130/80, low-sodium, DASH diet, medication compliance, 150 minutes of moderate intensity exercise per week. Discussed medication compliance, adverse effects.  3. Primary malignant neoplasm of breast with metastasis ([place]) With pulmonary and bony metastasis Continue letrozole and [name] per oncology We will work on obtaining PCS services for her  4. Physical deconditioning Due to metastatic breast cancer She will benefit from PT Daughter is a major caregiver I have had the case manager meet with her so we can arrange for [facility] services  for her    No orders of the defined types were placed in this encounter.   Follow-up: Return for follow up, keep previously scheduled appointment.       Charlott Rakes, MD, FAAFP. [facility] [place], [place]   03/21/2022, 1:06 PM

## 2022-03-21 NOTE — Telephone Encounter (Signed)
I met with the patient and her daughter, [name], when they were in the clinic today. They are requesting home health services and have no  preference for agencies. The only request is that they do not want [facility].  I explained that I will try to find an agency that will accept the referral but there is no guarantee because the agency needs to be in network with her insurance and have staffing available.  [name] said she understands. I also explained that the services would be short term.   The patient does not have Medicaid and as per her daughter, she is over income.  I explained that she may be eligible for Community Alternatives Program (CAP) because the income limit is higher. I provided [name] with the number for CAP # [phone] and encouraged her to call to discuss the income requirement for special assistance in home services.  [name] then explained that she is familiar with CAP and would call to discuss her mother's status

## 2022-03-22 ENCOUNTER — Other Ambulatory Visit: Payer: Self-pay

## 2022-03-22 ENCOUNTER — Telehealth: Payer: Self-pay

## 2022-03-22 NOTE — Telephone Encounter (Signed)
LVM to schedule AWV.

## 2022-03-22 NOTE — Patient Outreach (Signed)
[facility]) Care Management  03/22/2022  MARYA LOWDEN 06-17-1946 [id number]   Telephone Assessment    Unsuccessful outreach attempt to patient. No answer after multiple rings.     Plan: RN CM will make outreach attempt to patient within 4 business days.   Enzo Montgomery, RN,BSN,CCM [place] Management Telephonic Care Management Coordinator Direct Phone: [phone] Toll Free: [phone] Fax: [phone]

## 2022-03-26 ENCOUNTER — Other Ambulatory Visit: Payer: Self-pay

## 2022-03-26 NOTE — Patient Outreach (Signed)
[facility]) Care Management  03/26/2022  Heather Rogers 1945/10/17 [id number]   Telephone Assessment   Successful outreach call to patent's caregiver/daughter-Heather Rogers. She is pleased to report how much better patient is doing and feeling. She voices that her [name] was decreased to 50 mg B.I.D. Since then patient has been more alert, eating better and moving around more. She was even able to prepare her own meals the past few days as well as attend church which she hasn't done in a long time. Denies any RN CM needs or concerns at this time.   Medications Reviewed Today     Reviewed by Hayden Pedro, RN (Registered Nurse) on 03/26/22 at 1323  Med List Status: <None>   Medication Order Taking? Sig Documenting Provider Last Dose Status Informant  abemaciclib ([name]) 50 MG tablet [id number] No Take 1 tablet (50 mg total) by mouth 2 (two) times daily. Swallow tablets whole. Do not chew, crush, or split tablets before swallowing. Nicholas Lose, MD Taking Active Family Member  Accu-Chek FastClix Lancets [place] [id number] No Check blood sugar fasting and before meals and again if pt feels bad (symptoms of hypo). Charlott Rakes, MD Taking Active Family Member  allopurinol (ZYLOPRIM) 100 MG tablet [id number] No Take 1 tablet (100 mg total) by mouth daily. Charlott Rakes, MD Taking Active Family Member  amLODipine (NORVASC) 10 MG tablet [id number] No TAKE ONE TABLET BY MOUTH DAILY Charlott Rakes, MD Taking Active Family Member  aspirin EC 81 MG tablet [id number] No Take 81 mg by mouth in the morning. Swallow whole. [provider] Taking Active Family Member  atorvastatin (LIPITOR) 80 MG tablet [id number] No TAKE ONE TABLET BY MOUTH EVERY EVENING TO LOWER CHOLESTEROL  Patient taking differently: Take 80 mg by mouth every evening.   Charlott Rakes, MD Taking Active Family Member  Blood Glucose Monitoring Suppl (ACCU-CHEK GUIDE) w/Device KIT [id number] No 1 each  by Does not apply route as directed. Fulp, Cammie, MD Taking Active Family Member  carvedilol (COREG) 25 MG tablet [id number] No Take 1 tablet (25 mg total) by mouth 2 (two) times daily with a meal. Newlin, Enobong, MD Taking Active Family Member  cholecalciferol (VITAMIN D3) 25 MCG (1000 UNIT) tablet [id number] No Take 1,000 Units by mouth daily. [provider] Taking Active Family Member  ferrous sulfate 325 (65 FE) MG EC tablet [id number] No Take 1 tablet (325 mg total) by mouth in the morning and at bedtime. Mayers, Cari S, PA-C Taking Active Family Member  gabapentin (NEURONTIN) 100 MG capsule [id number] No Take 1 capsule (100 mg total) by mouth 3 (three) times daily. Charlott Rakes, MD Taking Active Family Member  glucose blood (ACCU-CHEK GUIDE) test strip [id number] No USE TO CHECK FASTING BLOOD SUGAR AND BEFORE MEALS AND AGAIN IF PATIENT FEELS BAD; SYMPTOMS OF HYPOGLYCEMIA Charlott Rakes, MD Taking Active Family Member  hydrALAZINE (APRESOLINE) 50 MG tablet [id number] No Take 1 tablet (50 mg total) by mouth 3 (three) times daily. Charlott Rakes, MD Taking Active Family Member  letrozole [facility]) 2.5 MG tablet [id number] No Take 1 tablet (2.5 mg total) by mouth daily.  Patient taking differently: Take 2.5 mg by mouth every evening.   Nicholas Lose, MD Taking Active Family Member  metFORMIN (GLUCOPHAGE) 500 MG tablet [id number] No Take 1 tablet (500 mg total) by mouth daily with breakfast. TAKE ONE TABLET BY MOUTH TWICE A DAY WITH A MEAL  Patient taking differently: Take 500 mg by mouth daily with breakfast.   Newlin, Enobong,  MD Taking Active Family Member  Multiple Vitamin (MULTIVITAMIN WITH MINERALS) TABS tablet [id number] No Take 1 tablet by mouth daily. Jonetta Osgood, MD Taking Active Family Member  ondansetron [facility]) 8 MG tablet [id number] No Take 1 tablet (8 mg total) by mouth every 8 (eight) hours as needed for nausea or vomiting. Nicholas Lose, MD Taking Active Family Member   polyethylene glycol (MIRALAX / GLYCOLAX) 17 g packet [id number] No Take 17 g by mouth daily as needed (constipation.). Nicholas Lose, MD Taking Active Family Member  prochlorperazine (COMPAZINE) 10 MG tablet [id number] No Take 1 tablet (10 mg total) by mouth every 6 (six) hours as needed for nausea or vomiting. Nicholas Lose, MD Taking Active Family Member  Med List Note Raina Mina, RPH-CPP 02/13/22 [id number]): [name] filled at [facility] [place] [facility] as [facility])             Care Plan : RN Care Manager POC  Updates made by Hayden Pedro, RN since 03/26/2022 12:00 AM     Problem: Chronic Disease Mgmt of Chronic Condition-pleural effusion   Priority: High     Long-Range Goal: Development of POC for Mgmt of Chronic Condition-pleural effusion   Start Date: 03/05/2022  Expected End Date: 03/06/2023  This Visit's Progress: On track  Priority: High  Note:     Current Barriers:  Chronic Disease Management support and education needs related to pleural effusion   RNCM Clinical Goal(s):  Patient will verbalize understanding of plan for management of pleural effusion as evidenced by mgmt of chronic condition continue to work with RN Care Manager to address care management and care coordination needs related to  pleural effusion as evidenced by adherence to CM Team Scheduled appointments demonstrate ongoing self health care management ability of pleural effusion as evidenced by mgmt of pleurex cath  through collaboration with RN Care manager, provider, and care team.   Interventions: 1:1 collaboration with primary care provider regarding development and update of comprehensive plan of care as evidenced by provider attestation and co-signature Inter-disciplinary care team collaboration (see longitudinal plan of care) Evaluation of current treatment plan related to  self management and patient's adherence to plan as established by provider   Pleural effusion   (Status:  Goal on track:  Yes.)  Long Term Goal Evaluation of current treatment plan related to  pleural effusion ,  self-management and patient's adherence to plan as established by provider. Discussed plans with patient for ongoing care management follow up and provided patient with direct contact information for care management team Evaluation of current treatment plan related to pleural effusion and patient's adherence to plan as established by provider 03/26/22-Caregiver reports that pleurex cath was recently removed. Patient doing much better.   Patient Goals/Self-Care Activities: Take all medications as prescribed Attend all scheduled provider appointments Call provider office for new concerns or questions   Follow Up Plan:  Telephone follow up appointment with care management team member scheduled for:  within the month of Sept The patient has been provided with contact information for the care management team and has been advised to call with any health related questions or concerns.        Plan: RN CM discussed with caregiver next outreach within the month of Sept. .Caregiver agrees to care plan and follow up.  Enzo Montgomery, RN,BSN,CCM [facility] Telephonic Care Management Coordinator Direct Phone: [phone] Toll Free: [phone] Fax: [phone]

## 2022-03-28 ENCOUNTER — Other Ambulatory Visit: Payer: Self-pay

## 2022-03-28 NOTE — Patient Outreach (Signed)
[facility]) Care Management  03/28/2022  TRAVIS PURK Jan 02, 1946 [id number]   Case Closure   Case is being transferred. Assigned RN CM will outreach and follow up with patient.     Enzo Montgomery, RN,BSN,CCM [place] Management Telephonic Care Management Coordinator Direct Phone: [phone] Toll Free: [phone] Fax: [phone]

## 2022-04-16 ENCOUNTER — Other Ambulatory Visit: Payer: Self-pay | Admitting: Family Medicine

## 2022-04-16 ENCOUNTER — Ambulatory Visit: Payer: Medicare HMO | Admitting: Internal Medicine

## 2022-04-16 ENCOUNTER — Encounter: Payer: Self-pay | Admitting: Hematology and Oncology

## 2022-04-17 ENCOUNTER — Other Ambulatory Visit: Payer: Self-pay

## 2022-04-17 MED ORDER — ABEMACICLIB 50 MG PO TABS: 50.0000 mg | ORAL_TABLET | Freq: Two times a day (BID) | ORAL | Status: DC

## 2022-04-17 NOTE — Telephone Encounter (Signed)
Requested Prescriptions  Pending Prescriptions Disp Refills  . allopurinol (ZYLOPRIM) 100 MG tablet [Pharmacy Med Name: ALLOPURINOL 100 MG Tablet] 90 tablet 1    Sig: TAKE 1 TABLET EVERY DAY     Endocrinology:  Gout Agents - allopurinol Failed - 04/16/2022  1:34 PM      Failed - Cr in normal range and within 360 days    Creatinine  Date Value Ref Range Status  03/15/2022 1.46 (H) 0.44 - 1.00 mg/dL Final   Creatinine, Urine  Date Value Ref Range Status  07/25/2020 255.19 mg/dL Final    Comment:    Performed at [facility], [place] [address], [place], [place] [address]         Passed - Uric Acid in normal range and within 360 days    Uric Acid  Date Value Ref Range Status  05/24/2021 5.1 3.1 - 7.9 mg/dL Final    Comment:               Therapeutic target for gout patients: <6.0         Passed - Valid encounter within last 12 months    Recent Outpatient Visits          3 weeks ago Type 2 diabetes mellitus with diabetic mononeuropathy, without long-term current use of insulin ([place])   [place], [place], MD   3 months ago Type 2 diabetes mellitus with diabetic mononeuropathy, without long-term current use of insulin ([place])   [place], [place], MD   6 months ago Type 2 diabetes mellitus with diabetic mononeuropathy, without long-term current use of insulin ([place])   [place], [place], MD   6 months ago Hypertension associated with diabetes ([place])   [place], [place], MD   11 months ago Type 2 diabetes mellitus with diabetic mononeuropathy, without long-term current use of insulin ([place])   [place] Mayers, Cari S, PA-C      Future Appointments            In 2 weeks Charlott Rakes, MD [place] - CBC within normal limits and completed in the  last 12 months    WBC  Date Value Ref Range Status  03/01/2022 5.8 4.0 - 10.5 K/uL Final   WBC Count  Date Value Ref Range Status  03/15/2022 6.8 4.0 - 10.5 K/uL Final   RBC  Date Value Ref Range Status  03/15/2022 4.06 3.87 - 5.11 MIL/uL Final   Hemoglobin  Date Value Ref Range Status  03/15/2022 10.8 (L) 12.0 - 15.0 g/dL Final  05/24/2021 10.3 (L) 11.1 - 15.9 g/dL Final   HCT  Date Value Ref Range Status  03/15/2022 32.7 (L) 36.0 - 46.0 % Final   Hematocrit  Date Value Ref Range Status  05/24/2021 32.4 (L) 34.0 - 46.6 % Final   MCHC  Date Value Ref Range Status  03/15/2022 33.0 30.0 - 36.0 g/dL Final   [facility]  Date Value Ref Range Status  03/15/2022 26.6 26.0 - 34.0 pg Final   MCV  Date Value Ref Range Status  03/15/2022 80.5 80.0 - 100.0 fL Final  05/24/2021 72 (L) 79 - 97 fL Final   No results found for: "PLTCOUNTKUC", "LABPLAT", "POCPLA" RDW  Date Value Ref Range Status  03/15/2022 15.2  11.5 - 15.5 % Final  05/24/2021 14.4 11.7 - 15.4 % Final

## 2022-04-19 ENCOUNTER — Other Ambulatory Visit: Payer: Self-pay | Admitting: Family Medicine

## 2022-04-19 DIAGNOSIS — I672 Cerebral atherosclerosis: Secondary | ICD-10-CM

## 2022-04-19 DIAGNOSIS — Z8673 Personal history of transient ischemic attack (TIA), and cerebral infarction without residual deficits: Secondary | ICD-10-CM

## 2022-04-19 MED ORDER — ATORVASTATIN CALCIUM 80 MG PO TABS: 80.0000 mg | ORAL_TABLET | Freq: Every evening | ORAL | 0 refills | Status: DC

## 2022-04-19 NOTE — Telephone Encounter (Signed)
Medication Refill - Medication: atorvastatin (LIPITOR) 80 MG tablet  amLODipine (NORVASC) 10 MG tablet  Has the patient contacted their pharmacy? Yes.   (Agent: If no, request that the patient contact the pharmacy for the refill. If patient does not wish to contact the pharmacy document the reason why and proceed with request.) (Agent: If yes, when and what did the pharmacy advise?)  Preferred Pharmacy (with phone number or street name):  [place], [place]  [place] [address]  Phone: [phone] Fax: [phone]   Has the patient been seen for an appointment in the last year OR does the patient have an upcoming appointment? Yes.    Agent: Please be advised that RX refills may take up to 3 business days. We ask that you follow-up with your pharmacy.

## 2022-04-19 NOTE — Telephone Encounter (Signed)
Unable to refill per protocol, request is too soon. Last refill 03/09/22 for 90 days. Will refuse request.  Requested Prescriptions  Pending Prescriptions Disp Refills  . amLODipine (NORVASC) 10 MG tablet 90 tablet 0    Sig: Take 1 tablet (10 mg total) by mouth daily.     Cardiovascular: Calcium Channel Blockers 2 Passed - 04/19/2022 10:19 AM      Passed - Last BP in normal range    BP Readings from Last 1 Encounters:  03/21/22 139/72         Passed - Last Heart Rate in normal range    Pulse Readings from Last 1 Encounters:  03/21/22 77         Passed - Valid encounter within last 6 months    Recent Outpatient Visits          4 weeks ago Type 2 diabetes mellitus with diabetic mononeuropathy, without long-term current use of insulin ([place])   [place], [place], MD   3 months ago Type 2 diabetes mellitus with diabetic mononeuropathy, without long-term current use of insulin ([place])   [place], [place], MD   6 months ago Type 2 diabetes mellitus with diabetic mononeuropathy, without long-term current use of insulin ([place])   [place], [place], MD   6 months ago Hypertension associated with diabetes [facility])   [place], [place], MD   11 months ago Type 2 diabetes mellitus with diabetic mononeuropathy, without long-term current use of insulin ([place])   [place], Cari S, PA-C      Future Appointments            In 2 weeks Charlott Rakes, MD [place]           Signed Prescriptions Disp Refills   atorvastatin (LIPITOR) 80 MG tablet 90 tablet 0    Sig: Take 1 tablet (80 mg total) by mouth every evening.     Cardiovascular:  Antilipid - Statins Failed - 04/19/2022 10:19 AM      Failed - Lipid Panel in normal range within the last 12 months    Cholesterol, Total   Date Value Ref Range Status  05/24/2021 115 100 - 199 mg/dL Final   LDL Chol Calc (NIH)  Date Value Ref Range Status  05/24/2021 57 0 - 99 mg/dL Final   HDL  Date Value Ref Range Status  05/24/2021 38 (L) >39 mg/dL Final   Triglycerides  Date Value Ref Range Status  05/24/2021 105 0 - 149 mg/dL Final         Passed - Patient is not pregnant      Passed - Valid encounter within last 12 months    Recent Outpatient Visits          4 weeks ago Type 2 diabetes mellitus with diabetic mononeuropathy, without long-term current use of insulin (HCC)   [place], [place], MD   3 months ago Type 2 diabetes mellitus with diabetic mononeuropathy, without long-term current use of insulin ([place])   [place], [place]land, MD   6 months ago Type 2 diabetes mellitus with diabetic mononeuropathy, without long-term current use of insulin ([place])   [place], Charlane Ferretti, MD   6 months ago Hypertension associated with diabetes ([place])  [place], [place], MD   11 months ago Type 2 diabetes mellitus with diabetic mononeuropathy, without long-term current use of insulin ([place])   [place], PA-C      Future Appointments            In 2 weeks Charlott Rakes, MD [place]

## 2022-04-19 NOTE — Telephone Encounter (Signed)
Requested Prescriptions  Pending Prescriptions Disp Refills  . amLODipine (NORVASC) 10 MG tablet 90 tablet 0    Sig: Take 1 tablet (10 mg total) by mouth daily.     Cardiovascular: Calcium Channel Blockers 2 Passed - 04/19/2022 10:19 AM      Passed - Last BP in normal range    BP Readings from Last 1 Encounters:  03/21/22 139/72         Passed - Last Heart Rate in normal range    Pulse Readings from Last 1 Encounters:  03/21/22 77         Passed - Valid encounter within last 6 months    Recent Outpatient Visits          4 weeks ago Type 2 diabetes mellitus with diabetic mononeuropathy, without long-term current use of insulin ([place])   [place], Joyce, MD   3 months ago Type 2 diabetes mellitus with diabetic mononeuropathy, without long-term current use of insulin ([place])   [place], [place], MD   6 months ago Type 2 diabetes mellitus with diabetic mononeuropathy, without long-term current use of insulin ([place])   [place], [place], MD   6 months ago Hypertension associated with diabetes [facility])   [place], Charlane Ferretti, MD   11 months ago Type 2 diabetes mellitus with diabetic mononeuropathy, without long-term current use of insulin ([place])   [place], PA-C      Future Appointments            In 2 weeks Charlott Rakes, MD [place]           . atorvastatin (LIPITOR) 80 MG tablet 90 tablet 0    Sig: Take 1 tablet (80 mg total) by mouth every evening.     Cardiovascular:  Antilipid - Statins Failed - 04/19/2022 10:19 AM      Failed - Lipid Panel in normal range within the last 12 months    Cholesterol, Total  Date Value Ref Range Status  05/24/2021 115 100 - 199 mg/dL Final   LDL Chol Calc (NIH)  Date Value Ref Range Status  05/24/2021 57 0 -  99 mg/dL Final   HDL  Date Value Ref Range Status  05/24/2021 38 (L) >39 mg/dL Final   Triglycerides  Date Value Ref Range Status  05/24/2021 105 0 - 149 mg/dL Final         Passed - Patient is not pregnant      Passed - Valid encounter within last 12 months    Recent Outpatient Visits          4 weeks ago Type 2 diabetes mellitus with diabetic mononeuropathy, without long-term current use of insulin (HCC)   [place], [place], MD   3 months ago Type 2 diabetes mellitus with diabetic mononeuropathy, without long-term current use of insulin ([place])   [place], [place], MD   6 months ago Type 2 diabetes mellitus with diabetic mononeuropathy, without long-term current use of insulin ([place])   [place], [place], MD   6 months ago Hypertension associated with diabetes [facility])   [place], Charlane Ferretti, MD   11 months ago Type 2 diabetes mellitus with diabetic mononeuropathy, without long-term current  use of insulin ([place])   [place] Mayers, Loraine Grip, PA-C      Future Appointments            In 2 weeks Charlott Rakes, MD [place]

## 2022-04-20 ENCOUNTER — Inpatient Hospital Stay: Payer: Medicare HMO | Attending: Hematology and Oncology

## 2022-04-20 ENCOUNTER — Inpatient Hospital Stay: Payer: Medicare HMO

## 2022-04-20 ENCOUNTER — Inpatient Hospital Stay: Payer: Medicare HMO | Admitting: Hematology and Oncology

## 2022-04-20 DIAGNOSIS — E041 Nontoxic single thyroid nodule: Secondary | ICD-10-CM | POA: Insufficient documentation

## 2022-04-20 DIAGNOSIS — Z79899 Other long term (current) drug therapy: Secondary | ICD-10-CM | POA: Insufficient documentation

## 2022-04-20 DIAGNOSIS — Z170 Estrogen receptor positive status [ER+]: Secondary | ICD-10-CM | POA: Insufficient documentation

## 2022-04-20 DIAGNOSIS — Z79811 Long term (current) use of aromatase inhibitors: Secondary | ICD-10-CM | POA: Insufficient documentation

## 2022-04-20 DIAGNOSIS — C50912 Malignant neoplasm of unspecified site of left female breast: Secondary | ICD-10-CM | POA: Insufficient documentation

## 2022-04-20 NOTE — Assessment & Plan Note (Deleted)
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 Hospitalization 09/02/2021-09/08/2021: Shortness of breath, large pleural effusion, thoracentesis, pleural fluid cytology: Metastatic adenocarcinoma breast origin ER 40%, PR 30%, Ki-67 5%, HER2 2+ by IHC, FISH negative ratio 1.43  Current treatment: [name] with letrozole(now reducing to 50 mg p.o. twice daily on 03/15/2022)  Toxicities: 1. Constipation alternating with diarrhea. 2.severe fatigue and lack of interest 3.Diminished appetite 4.  Severe fatigue: Decrease the dosage of Verzinio to 50 mg p.o. twice daily  02/22/2022: CT chest: Decreased volume of the pleural fluid.  Unchanged breast mass otherwise stable bone metastasis.  A noncontrasted could not see the lymph nodes very well.  Patient appears to be losing interest in life and living.

## 2022-04-20 NOTE — Progress Notes (Signed)
Patient Care Team: Charlott Rakes, MD as PCP - General (Family Medicine) Raina Mina, RPH-CPP as Pharmacist (Hematology and Oncology) Nicholas Lose, MD as Medical Oncologist (Hematology and Oncology)  DIAGNOSIS: No diagnosis found.  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  Hospitalization 09/02/2021-09/08/2021: Shortness of breath, large pleural effusion, thoracentesis, pleural fluid cytology: Metastatic adenocarcinoma breast origin ER/PR positive HER2 negative     CHIEF COMPLIANT: Follow-up metastatic breast cancer on letrozole  INTERVAL HISTORY: Heather Rogers is a 76 y.o. with above-mentioned history of left breast mass, bone lesions, pulmonary nodules, pleural effusion. She presents to the clinic today for follow-up.    ALLERGIES:  is allergic to sulfa antibiotics.  MEDICATIONS:  Current Outpatient Medications  Medication Sig Dispense Refill   abemaciclib ([name]) 50 MG tablet Take 1 tablet (50 mg total) by mouth 2 (two) times daily. Swallow tablets whole. Do not chew, crush, or split tablets before swallowing. 56 tablet    Accu-Chek FastClix Lancets MISC Check blood sugar fasting and before meals and again if pt feels bad (symptoms of hypo). 100 each 12   allopurinol (ZYLOPRIM) 100 MG tablet TAKE 1 TABLET EVERY DAY 90 tablet 1   amLODipine (NORVASC) 10 MG tablet TAKE ONE TABLET BY MOUTH DAILY 90 tablet 0   aspirin EC 81 MG tablet Take 81 mg by mouth in the morning. Swallow whole.     atorvastatin (LIPITOR) 80 MG tablet Take 1 tablet (80 mg total) by mouth every evening. 90 tablet 0   Blood Glucose Monitoring Suppl (ACCU-CHEK GUIDE) w/Device KIT 1 each by Does not apply route as directed. 1 kit 0   carvedilol (COREG) 25 MG tablet Take 1 tablet (25  mg total) by mouth 2 (two) times daily with a meal. 180 tablet 1   cholecalciferol (VITAMIN D3) 25 MCG (1000 UNIT) tablet Take 1,000 Units by mouth daily.     ferrous sulfate 325 (65 FE) MG EC tablet Take 1 tablet (325 mg total) by mouth in the morning and at bedtime. 60 tablet 1   gabapentin (NEURONTIN) 100 MG capsule Take 1 capsule (100 mg total) by mouth 3 (three) times daily. 270 capsule 1   glucose blood (ACCU-CHEK GUIDE) test strip USE TO CHECK FASTING BLOOD SUGAR AND BEFORE MEALS AND AGAIN IF PATIENT FEELS BAD; SYMPTOMS OF HYPOGLYCEMIA 100 strip 11   hydrALAZINE (APRESOLINE) 50 MG tablet Take 1 tablet (50 mg total) by mouth 3 (three) times daily. 270 tablet 1   letrozole (FEMARA) 2.5 MG tablet Take 1 tablet (2.5 mg total) by mouth daily. (Patient taking differently: Take 2.5 mg by mouth every evening.) 90 tablet 3   metFORMIN (GLUCOPHAGE) 500 MG tablet Take 1 tablet (500 mg total) by mouth daily with breakfast. TAKE ONE TABLET BY MOUTH TWICE A DAY WITH A MEAL (Patient taking differently: Take 500 mg by mouth daily with breakfast.) 90 tablet 1   Multiple Vitamin (MULTIVITAMIN WITH MINERALS) TABS tablet Take 1 tablet by mouth daily. 30 tablet 0   ondansetron (ZOFRAN) 8 MG tablet Take 1 tablet (8 mg total) by mouth every 8 (eight) hours as needed for nausea or vomiting. 30 tablet 2   polyethylene glycol (MIRALAX / GLYCOLAX) 17 g packet Take 17 g by mouth daily as needed (constipation.).  prochlorperazine (COMPAZINE) 10 MG tablet Take 1 tablet (10 mg total) by mouth every 6 (six) hours as needed for nausea or vomiting. 30 tablet 2   No current facility-administered medications for this visit.    PHYSICAL EXAMINATION: ECOG PERFORMANCE STATUS: {CHL ONC ECOG PS:[phone]}  There were no vitals filed for this visit. There were no vitals filed for this visit.  BREAST:*** No palpable masses or nodules in either right or left breasts. No palpable axillary supraclavicular or infraclavicular  adenopathy no breast tenderness or nipple discharge. (exam performed in the presence of a chaperone)  LABORATORY DATA:  I have reviewed the data as listed    Latest Ref Rng & Units 03/15/2022    1:30 PM 03/01/2022    3:19 AM 02/28/2022    5:32 AM  CMP  Glucose 70 - 99 mg/dL 131  124  143   BUN 8 - 23 mg/dL 28  14  15    Creatinine 0.44 - 1.00 mg/dL 1.46  0.95  0.95   Sodium 135 - 145 mmol/L 137  140  139   Potassium 3.5 - 5.1 mmol/L 4.3  4.0  4.2   Chloride 98 - 111 mmol/L 101  113  112   CO2 22 - 32 mmol/L 26  23  22    Calcium 8.9 - 10.3 mg/dL 10.0  8.5  8.7   Total Protein 6.5 - 8.1 g/dL 8.0     Total Bilirubin 0.3 - 1.2 mg/dL 0.2     Alkaline Phos 38 - 126 U/L 51     AST 15 - 41 U/L 24     ALT 0 - 44 U/L 25       Lab Results  Component Value Date   WBC 6.8 03/15/2022   HGB 10.8 (L) 03/15/2022   HCT 32.7 (L) 03/15/2022   MCV 80.5 03/15/2022   PLT 299 03/15/2022   NEUTROABS 5.3 03/15/2022    ASSESSMENT & PLAN:  No problem-specific Assessment & Plan notes found for this encounter.    No orders of the defined types were placed in this encounter.  The patient has a good understanding of the overall plan. she agrees with it. she will call with any problems that may develop before the next visit here. Total time spent: 30 mins including face to face time and time spent for planning, charting and co-ordination of care   Suzzette Righter, [place] 04/20/22    I Gardiner Coins am scribing for Dr. Lindi Adie  ***

## 2022-04-21 ENCOUNTER — Ambulatory Visit (HOSPITAL_COMMUNITY)
Admission: RE | Admit: 2022-04-21 | Discharge: 2022-04-21 | Disposition: A | Discharge status: Home / Self Care | Payer: Medicare HMO | Source: Ambulatory Visit | Attending: Hematology and Oncology | Admitting: Hematology and Oncology

## 2022-04-21 DIAGNOSIS — C50919 Malignant neoplasm of unspecified site of unspecified female breast: Secondary | ICD-10-CM | POA: Diagnosis not present

## 2022-04-21 DIAGNOSIS — C7951 Secondary malignant neoplasm of bone: Secondary | ICD-10-CM | POA: Diagnosis not present

## 2022-04-21 DIAGNOSIS — R911 Solitary pulmonary nodule: Secondary | ICD-10-CM | POA: Diagnosis not present

## 2022-04-21 DIAGNOSIS — J90 Pleural effusion, not elsewhere classified: Secondary | ICD-10-CM | POA: Insufficient documentation

## 2022-04-21 DIAGNOSIS — E041 Nontoxic single thyroid nodule: Secondary | ICD-10-CM | POA: Insufficient documentation

## 2022-04-21 DIAGNOSIS — I700 Atherosclerosis of aorta: Secondary | ICD-10-CM | POA: Diagnosis not present

## 2022-04-21 DIAGNOSIS — I2510 Atherosclerotic heart disease of native coronary artery without angina pectoris: Secondary | ICD-10-CM | POA: Diagnosis not present

## 2022-04-21 DIAGNOSIS — N6320 Unspecified lump in the left breast, unspecified quadrant: Secondary | ICD-10-CM | POA: Insufficient documentation

## 2022-04-23 ENCOUNTER — Inpatient Hospital Stay (HOSPITAL_BASED_OUTPATIENT_CLINIC_OR_DEPARTMENT_OTHER): Payer: Medicare HMO | Admitting: Hematology and Oncology

## 2022-04-23 ENCOUNTER — Other Ambulatory Visit: Payer: Self-pay

## 2022-04-23 VITALS — BP 148/56 | HR 75 | Temp 97.3°F | Resp 17 | Ht 62.0 in | Wt 136.1 lb

## 2022-04-23 DIAGNOSIS — C50919 Malignant neoplasm of unspecified site of unspecified female breast: Secondary | ICD-10-CM | POA: Diagnosis not present

## 2022-04-23 DIAGNOSIS — C50912 Malignant neoplasm of unspecified site of left female breast: Secondary | ICD-10-CM | POA: Diagnosis not present

## 2022-04-23 DIAGNOSIS — Z79811 Long term (current) use of aromatase inhibitors: Secondary | ICD-10-CM | POA: Diagnosis not present

## 2022-04-23 DIAGNOSIS — Z79899 Other long term (current) drug therapy: Secondary | ICD-10-CM | POA: Diagnosis not present

## 2022-04-23 DIAGNOSIS — E041 Nontoxic single thyroid nodule: Secondary | ICD-10-CM | POA: Diagnosis not present

## 2022-04-23 DIAGNOSIS — Z170 Estrogen receptor positive status [ER+]: Secondary | ICD-10-CM | POA: Diagnosis not present

## 2022-04-23 MED ORDER — ABEMACICLIB 50 MG PO TABS: 50.0000 mg | ORAL_TABLET | Freq: Two times a day (BID) | ORAL | 6 refills | Status: DC

## 2022-04-23 NOTE — Assessment & Plan Note (Signed)
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 Hospitalization 09/02/2021-09/08/2021: Shortness of breath, large pleural effusion, thoracentesis, pleural fluid cytology: Metastatic adenocarcinoma breast origin ER 40%, PR 30%, Ki-67 5%, HER2 2+ by IHC, FISH negative ratio 1.43  Current treatment: [name] with letrozole(now reducing to 50 mg p.o. twice daily on 03/15/2022)  Toxicities: 1. Constipation alternating with diarrhea. 2.severe fatigue and lack of interest 3.Diminished appetite 4.  Severe fatigue  01/15/22: Ct Chest: Large ulcerated breast mass slightly decreased in size. Axill and left IM LN dec in size, Multiple pulmonary and pleural nodules smaller, unchaged sternal met, and left 3 and 4 ribs  Thyroid nodule: We discussed the risks and benefits of obtaining a biopsy. She is not keen on doing it at this time.  02/22/2022: CT chest: Decreased volume of the pleural fluid.  Unchanged breast mass otherwise stable bone metastasis.  A noncontrasted could not see the lymph nodes very well.  Decreased performance status: We will reduce the dosage of Verzinio to 50 mg p.o. twice daily. She seems to have lost interest in continuing to live and therefore we had a lengthy discussion about goals of care.  For the time being she would like to stay on the medication.  If she decides to stop her treatment then we will consult with hospice

## 2022-04-25 ENCOUNTER — Encounter: Payer: Self-pay | Admitting: Student

## 2022-04-26 ENCOUNTER — Encounter: Payer: Self-pay | Admitting: Hematology and Oncology

## 2022-04-26 ENCOUNTER — Other Ambulatory Visit: Payer: Self-pay | Admitting: *Deleted

## 2022-04-26 ENCOUNTER — Other Ambulatory Visit: Payer: Self-pay | Admitting: Pharmacist

## 2022-04-26 MED ORDER — ABEMACICLIB 50 MG PO TABS: 50.0000 mg | ORAL_TABLET | Freq: Two times a day (BID) | ORAL | 6 refills | Status: DC

## 2022-05-03 ENCOUNTER — Encounter: Payer: Self-pay | Admitting: Family Medicine

## 2022-05-03 ENCOUNTER — Ambulatory Visit: Payer: Medicare HMO | Attending: Family Medicine | Admitting: Family Medicine

## 2022-05-03 VITALS — BP 163/63 | HR 76 | Temp 98.2°F | Ht 62.0 in | Wt 139.4 lb

## 2022-05-03 DIAGNOSIS — I152 Hypertension secondary to endocrine disorders: Secondary | ICD-10-CM | POA: Diagnosis not present

## 2022-05-03 DIAGNOSIS — E1141 Type 2 diabetes mellitus with diabetic mononeuropathy: Secondary | ICD-10-CM

## 2022-05-03 DIAGNOSIS — C50919 Malignant neoplasm of unspecified site of unspecified female breast: Secondary | ICD-10-CM

## 2022-05-03 DIAGNOSIS — Z23 Encounter for immunization: Secondary | ICD-10-CM | POA: Diagnosis not present

## 2022-05-03 DIAGNOSIS — E1159 Type 2 diabetes mellitus with other circulatory complications: Secondary | ICD-10-CM

## 2022-05-03 MED ORDER — HYDRALAZINE HCL 100 MG PO TABS: 100.0000 mg | ORAL_TABLET | Freq: Three times a day (TID) | ORAL | 1 refills | Status: DC

## 2022-05-03 MED ORDER — CARVEDILOL 25 MG PO TABS: 25.0000 mg | ORAL_TABLET | Freq: Two times a day (BID) | ORAL | 1 refills | Status: DC

## 2022-05-03 NOTE — Patient Instructions (Signed)
Exercising to Stay Healthy To become healthy and stay healthy, it is recommended that you do moderate-intensity and vigorous-intensity exercise. You can tell that you are exercising at a moderate intensity if your heart starts beating faster and you start breathing faster but can still hold a conversation. You can tell that you are exercising at a vigorous intensity if you are breathing much harder and faster and cannot hold a conversation while exercising. How can exercise benefit me? Exercising regularly is important. It has many health benefits, such as: Improving overall fitness, flexibility, and endurance. Increasing bone density. Helping with weight control. Decreasing body fat. Increasing muscle strength and endurance. Reducing stress and tension, anxiety, depression, or anger. Improving overall health. What guidelines should I follow while exercising? Before you start a new exercise program, talk with your health care provider. Do not exercise so much that you hurt yourself, feel dizzy, or get very short of breath. Wear comfortable clothes and wear shoes with good support. Drink plenty of water while you exercise to prevent dehydration or heat stroke. Work out until your breathing and your heartbeat get faster (moderate intensity). How often should I exercise? Choose an activity that you enjoy, and set realistic goals. Your health care provider can help you make an activity plan that is individually designed and works best for you. Exercise regularly as told by your health care provider. This may include: Doing strength training two times a week, such as: Lifting weights. Using resistance bands. Push-ups. Sit-ups. Yoga. Doing a certain intensity of exercise for a given amount of time. Choose from these options: A total of 150 minutes of moderate-intensity exercise every week. A total of 75 minutes of vigorous-intensity exercise every week. A mix of moderate-intensity and  vigorous-intensity exercise every week. Children, pregnant women, people who have not exercised regularly, people who are overweight, and older adults may need to talk with a health care provider about what activities are safe to perform. If you have a medical condition, be sure to talk with your health care provider before you start a new exercise program. What are some exercise ideas? Moderate-intensity exercise ideas include: Walking 1 mile (1.6 km) in about 15 minutes. Biking. Hiking. Golfing. Dancing. Water aerobics. Vigorous-intensity exercise ideas include: Walking 4.5 miles (7.2 km) or more in about 1 hour. Jogging or running 5 miles (8 km) in about 1 hour. Biking 10 miles (16.1 km) or more in about 1 hour. Lap swimming. Roller-skating or in-line skating. Cross-country skiing. Vigorous competitive sports, such as football, basketball, and soccer. Jumping rope. Aerobic dancing. What are some everyday activities that can help me get exercise? Yard work, such as: Pushing a lawn mower. Raking and bagging leaves. Washing your car. Pushing a stroller. Shoveling snow. Gardening. Washing windows or floors. How can I be more active in my day-to-day activities? Use stairs instead of an elevator. Take a walk during your lunch break. If you drive, park your car farther away from your work or school. If you take public transportation, get off one stop early and walk the rest of the way. Stand up or walk around during all of your indoor phone calls. Get up, stretch, and walk around every 30 minutes throughout the day. Enjoy exercise with a friend. Support to continue exercising will help you keep a regular routine of activity. Where to find more information You can find more information about exercising to stay healthy from: U.S. Department of Health and Human Services: www.hhs.gov Centers for Disease Control and Prevention (  CDC): www.cdc.gov Summary Exercising regularly is  important. It will improve your overall fitness, flexibility, and endurance. Regular exercise will also improve your overall health. It can help you control your weight, reduce stress, and improve your bone density. Do not exercise so much that you hurt yourself, feel dizzy, or get very short of breath. Before you start a new exercise program, talk with your health care provider. This information is not intended to replace advice given to you by your health care provider. Make sure you discuss any questions you have with your health care provider. Document Revised: 11/11/2020 Document Reviewed: 11/11/2020 Elsevier Patient Education  2023 Elsevier Inc.  

## 2022-05-03 NOTE — Progress Notes (Signed)
Subjective:  Patient ID: Heather Rogers, female    DOB: Feb 25, 1946  Age: 76 y.o. MRN: [id number]  CC: Hypertension   HPI Heather Rogers is a 76 y.o. year old female with a history of type 2 diabetes mellitus (A1c 5.5), hypertension, gout, COVID-19 in 06/2020,, history of CVA, status post loop recorder placement in 2019, metastatic L breast cancer (pulmonary and bony metastasis) She is accompanied by her daughter to today's visit.  Interval History: BP has been fluctuating  160/67, 164/66, 163/65 in the mornings prior to antihypertensives 138/65 sometimes in the evenings but at other times in the 099I systolic. Endorses adherence with her antihypertensive. She has her last eye exam with Dr Gershon Crane on [address] a few months ago. With regards to her diabetes she has not experienced any symptoms of hypoglycemia and remains on metformin once daily.  She has no neuropathy. She does not exercise much.  She had a visit with her oncologist 2 weeks ago and per notes her breast mass is shrinking and she is currently on Letrozole and [name].  Plan is to repeat scan in 4 months. States her appetite is good and she has no nausea, vomiting, diarrhea or constipation. Past Medical History:  Diagnosis Date   Chronic kidney disease, stage 3b ([place])    Diabetes ([place])    Hypertension    Pneumonia due to COVID-19 virus 07/25/2020   Primary malignant neoplasm of breast with metastasis ([place]) 09/14/2021   Stroke (cerebrum) ([place])    Stroke ([place]) 05/15/2018    Past Surgical History:  Procedure Laterality Date   CHEST TUBE INSERTION Left 09/07/2021   Procedure: CHEST TUBE INSERTION;  Surgeon: Candee Furbish, MD;  Location: [facility] ENDOSCOPY;  Service: Pulmonary;  Laterality: Left;   CHEST TUBE INSERTION N/A 03/19/2022   Procedure: REMOVAL PLEURAL DRAINAGE CATHETER;  Surgeon: Maryjane Hurter, MD;  Location: [facility] ENDOSCOPY;  Service: Pulmonary;  Laterality: N/A;   EYE SURGERY Left 04/06/2020    Implant placed   EYE SURGERY Right 03/30/2020   Implant placed    hysterectomy     LOOP RECORDER INSERTION N/A 05/20/2018   Procedure: LOOP RECORDER INSERTION;  Surgeon: Thompson Grayer, MD;  Location: [facility];  Service: Cardiovascular;  Laterality: N/A;   TEE WITHOUT CARDIOVERSION N/A 05/20/2018   Procedure: TRANSESOPHAGEAL ECHOCARDIOGRAM (TEE);  Surgeon: Sueanne Margarita, MD;  Location: [facility] ENDOSCOPY;  Service: Cardiovascular;  Laterality: N/A;    Family History  Problem Relation Age of Onset   Hypertension Mother    Stroke Mother    Hypertension Father     Social History   Socioeconomic History   Marital status: Divorced    Spouse name: Not on file   Number of children: Not on file   Years of education: Not on file   Highest education level: Not on file  Occupational History   Not on file  Tobacco Use   Smoking status: Former    Packs/day: 1.00    Years: 15.00    Total pack years: 15.00    Types: Cigarettes    Quit date: 07/30/1985    Years since quitting: 36.7   Smokeless tobacco: Never  Vaping Use   Vaping Use: Never used  Substance and Sexual Activity   Alcohol use: Never   Drug use: Never   Sexual activity: Not Currently  Other Topics Concern   Not on file  Social History Narrative   Lives in [place].   Social Determinants of Health  Financial Resource Strain: Not on file  Food Insecurity: No Food Insecurity (03/05/2022)   Hunger Vital Sign    Worried About Running Out of Food in the Last Year: Never true    Ran Out of Food in the Last Year: Never true  Transportation Needs: No Transportation Needs (03/05/2022)   PRAPARE - Hydrologist (Medical): No    Lack of Transportation (Non-Medical): No  Physical Activity: Not on file  Stress: Not on file  Social Connections: Not on file    Allergies  Allergen Reactions   Sulfa Antibiotics Itching    Outpatient Medications Prior to Visit  Medication Sig Dispense Refill    abemaciclib ([name]) 50 MG tablet Take 1 tablet (50 mg total) by mouth 2 (two) times daily. Swallow tablets whole. Do not chew, crush, or split tablets before swallowing. 56 tablet 6   Accu-Chek FastClix Lancets MISC Check blood sugar fasting and before meals and again if pt feels bad (symptoms of hypo). 100 each 12   allopurinol (ZYLOPRIM) 100 MG tablet TAKE 1 TABLET EVERY DAY 90 tablet 1   amLODipine (NORVASC) 10 MG tablet TAKE ONE TABLET BY MOUTH DAILY 90 tablet 0   aspirin EC 81 MG tablet Take 81 mg by mouth in the morning. Swallow whole.     atorvastatin (LIPITOR) 80 MG tablet Take 1 tablet (80 mg total) by mouth every evening. 90 tablet 0   Blood Glucose Monitoring Suppl (ACCU-CHEK GUIDE) w/Device KIT 1 each by Does not apply route as directed. 1 kit 0   cholecalciferol (VITAMIN D3) 25 MCG (1000 UNIT) tablet Take 1,000 Units by mouth daily.     ferrous sulfate 325 (65 FE) MG EC tablet Take 1 tablet (325 mg total) by mouth in the morning and at bedtime. 60 tablet 1   gabapentin (NEURONTIN) 100 MG capsule Take 1 capsule (100 mg total) by mouth 3 (three) times daily. 270 capsule 1   glucose blood (ACCU-CHEK GUIDE) test strip USE TO CHECK FASTING BLOOD SUGAR AND BEFORE MEALS AND AGAIN IF PATIENT FEELS BAD; SYMPTOMS OF HYPOGLYCEMIA 100 strip 11   letrozole (FEMARA) 2.5 MG tablet Take 1 tablet (2.5 mg total) by mouth daily. (Patient taking differently: Take 2.5 mg by mouth every evening.) 90 tablet 3   metFORMIN (GLUCOPHAGE) 500 MG tablet Take 1 tablet (500 mg total) by mouth daily with breakfast. TAKE ONE TABLET BY MOUTH TWICE A DAY WITH A MEAL (Patient taking differently: Take 500 mg by mouth daily with breakfast.) 90 tablet 1   Multiple Vitamin (MULTIVITAMIN WITH MINERALS) TABS tablet Take 1 tablet by mouth daily. 30 tablet 0   ondansetron (ZOFRAN) 8 MG tablet Take 1 tablet (8 mg total) by mouth every 8 (eight) hours as needed for nausea or vomiting. 30 tablet 2   polyethylene glycol (MIRALAX  / GLYCOLAX) 17 g packet Take 17 g by mouth daily as needed (constipation.).     prochlorperazine (COMPAZINE) 10 MG tablet Take 1 tablet (10 mg total) by mouth every 6 (six) hours as needed for nausea or vomiting. 30 tablet 2   carvedilol (COREG) 25 MG tablet Take 1 tablet (25 mg total) by mouth 2 (two) times daily with a meal. 180 tablet 1   hydrALAZINE (APRESOLINE) 50 MG tablet Take 1 tablet (50 mg total) by mouth 3 (three) times daily. 270 tablet 1   No facility-administered medications prior to visit.     ROS Review of Systems  Constitutional:  Negative for  activity change and appetite change.  HENT:  Negative for sinus pressure and sore throat.   Respiratory:  Negative for chest tightness, shortness of breath and wheezing.   Cardiovascular:  Negative for chest pain and palpitations.  Gastrointestinal:  Negative for abdominal distention, abdominal pain and constipation.  Genitourinary: Negative.   Musculoskeletal: Negative.   Psychiatric/Behavioral:  Negative for behavioral problems and dysphoric mood.     Objective:  BP (!) 163/63   Pulse 76   Temp 98.2 F (36.8 C) (Oral)   Ht _0  (1.575 m)   Wt 139 lb 6.4 oz (63.2 kg)   SpO2 99%   BMI 25.50 kg/m      05/03/2022   11:07 AM 04/23/2022   11:52 AM 03/21/2022   10:10 AM  BP/Weight  Systolic BP 403 474 259  Diastolic BP 63 56 72  Wt. (Lbs) 139.4 136.1 130  BMI 25.5 kg/m2 24.89 kg/m2 23.78 kg/m2      Physical Exam Constitutional:      Appearance: She is well-developed.  Cardiovascular:     Rate and Rhythm: Normal rate.     Heart sounds: Normal heart sounds. No murmur heard. Pulmonary:     Effort: Pulmonary effort is normal.     Breath sounds: Normal breath sounds. No wheezing or rales.  Chest:     Chest wall: No tenderness.  Abdominal:     General: Bowel sounds are normal. There is no distension.     Palpations: Abdomen is soft. There is no mass.     Tenderness: There is no abdominal tenderness.   Musculoskeletal:        General: Normal range of motion.     Right lower leg: No edema.     Left lower leg: No edema.  Neurological:     Mental Status: She is alert and oriented to person, place, and time.  Psychiatric:        Mood and Affect: Mood normal.        Latest Ref Rng & Units 03/15/2022    1:30 PM 03/01/2022    3:19 AM 02/28/2022    5:32 AM  CMP  Glucose 70 - 99 mg/dL 131  124  143   BUN 8 - 23 mg/dL _1 Creatinine 0.44 - 1.00 mg/dL 1.46  0.95  0.95   Sodium 135 - 145 mmol/L 137  140  139   Potassium 3.5 - 5.1 mmol/L 4.3  4.0  4.2   Chloride 98 - 111 mmol/L 101  113  112   CO2 22 - 32 mmol/L _2 Calcium 8.9 - 10.3 mg/dL 10.0  8.5  8.7   Total Protein 6.5 - 8.1 g/dL 8.0     Total Bilirubin 0.3 - 1.2 mg/dL 0.2     Alkaline Phos 38 - 126 U/L 51     AST 15 - 41 U/L 24     ALT 0 - 44 U/L 25       Lipid Panel     Component Value Date/Time   CHOL 115 05/24/2021 1003   TRIG 105 05/24/2021 1003   HDL 38 (L) 05/24/2021 1003   CHOLHDL 3.0 05/24/2021 1003   CHOLHDL 4.7 05/16/2018 0420   VLDL 35 05/16/2018 0420   LDLCALC 57 05/24/2021 1003    CBC    Component Value Date/Time   WBC 6.8 03/15/2022 1330   WBC 5.8 03/01/2022 0319   RBC 4.06 03/15/2022 1330  HGB 10.8 (L) 03/15/2022 1330   HGB 10.3 (L) 05/24/2021 1003   HCT 32.7 (L) 03/15/2022 1330   HCT 32.4 (L) 05/24/2021 1003   PLT 299 03/15/2022 1330   PLT 345 05/24/2021 1003   MCV 80.5 03/15/2022 1330   MCV 72 (L) 05/24/2021 1003   MCH 26.6 03/15/2022 1330   MCHC 33.0 03/15/2022 1330   RDW 15.2 03/15/2022 1330   RDW 14.4 05/24/2021 1003   LYMPHSABS 1.0 03/15/2022 1330   LYMPHSABS 1.6 05/24/2021 1003   MONOABS 0.3 03/15/2022 1330   EOSABS 0.1 03/15/2022 1330   EOSABS 0.1 05/24/2021 1003   BASOSABS 0.0 03/15/2022 1330   BASOSABS 0.0 05/24/2021 1003    Lab Results  Component Value Date   HGBA1C 5.5 01/08/2022    Assessment & Plan:  1. Hypertension associated with diabetes  ([place]) Uncontrolled Increase dose of hydralazine Counseled on blood pressure goal of less than 130/80, low-sodium, DASH diet, medication compliance, 150 minutes of moderate intensity exercise per week. Discussed medication compliance, adverse effects. - hydrALAZINE (APRESOLINE) 100 MG tablet; Take 1 tablet (100 mg total) by mouth 3 (three) times daily.  Dispense: 180 tablet; Refill: 1 - carvedilol (COREG) 25 MG tablet; Take 1 tablet (25 mg total) by mouth 2 (two) times daily with a meal.  Dispense: 180 tablet; Refill: 1  2. Type 2 diabetes mellitus with diabetic mononeuropathy, without long-term current use of insulin (HCC) Controlled with A1c of 5.5 Continue metformin Counseled on Diabetic diet, my plate method, 680 minutes of moderate intensity exercise/week Blood sugar logs with fasting goals of 80-120 mg/dl, random of less than 180 and in the event of sugars less than 60 mg/dl or greater than 400 mg/dl encouraged to notify the clinic. Advised on the need for annual eye exams, annual foot exams, Pneumonia vaccine.  3. Primary malignant neoplasm of breast with metastasis ([place]) Currently on Letrozole and [name] Follow-up with oncology  4. Flu vaccine need - Flu Vaccine QUAD High Dose(Fluad)   Health Care Maintenance: Discussed need for bone density and patient declines Meds ordered this encounter  Medications   hydrALAZINE (APRESOLINE) 100 MG tablet    Sig: Take 1 tablet (100 mg total) by mouth 3 (three) times daily.    Dispense:  180 tablet    Refill:  1    Dose increase   carvedilol (COREG) 25 MG tablet    Sig: Take 1 tablet (25 mg total) by mouth 2 (two) times daily with a meal.    Dispense:  180 tablet    Refill:  1    Follow-up: Return in about 3 months (around 08/03/2022) for Chronic medical conditions.       Charlott Rakes, MD, FAAFP. [facility] [place], [place]   05/03/2022, 11:34 AM

## 2022-05-15 ENCOUNTER — Telehealth: Payer: Self-pay | Admitting: Pharmacy Technician

## 2022-05-15 NOTE — Telephone Encounter (Signed)
Oral Oncology Patient Advocate Encounter   Received notification that patient is due for re-enrollment for assistance for [name] through [employer].   Re-enrollment process has been initiated and will be submitted upon completion of necessary documents.  Re-enrollment documents requiring patient signature sent to email: [email or web address] phone number [phone].   I will continue to follow until final determination.  Heather Rogers, CPhT-Adv Oncology Pharmacy Patient [place] Direct Number: [phone]  Fax: [phone]

## 2022-05-18 NOTE — Telephone Encounter (Signed)
Oral Oncology Patient Advocate Encounter   Submitted application for assistance for [name] to [employer].   Application submitted via online portal   [employer] phone number [phone].   I will continue to check the status until final determination.   Griffon Herberg, CPhT-Adv Oncology Pharmacy Patient Advocate [facility] Direct Number: [phone]  Fax: [phone]   

## 2022-05-21 NOTE — Telephone Encounter (Signed)
Oral Oncology Patient Advocate Encounter   Received notification that the application for assistance for [name] through [employer] has been approved.   [employer] number [phone].   Effective dates: 05/21/2022 through 07/30/2023  I have spoken to the patient.  Lady Deutscher, CPhT-Adv Oncology Pharmacy Patient [place] Direct Number: [phone]  Fax: [phone]

## 2022-05-24 ENCOUNTER — Ambulatory Visit: Payer: Self-pay

## 2022-05-24 NOTE — Patient Outreach (Signed)
  Care Coordination   05/24/2022 Name: Heather Rogers MRN: [id number] DOB: Jun 03, 1946   Care Coordination Outreach Attempts:  An unsuccessful telephone outreach was attempted for a scheduled appointment today.  Follow Up Plan:  Additional outreach attempts will be made to offer the patient care coordination information and services.   Encounter Outcome:  No Answer  Care Coordination Interventions Activated:  No   Care Coordination Interventions:  No, not indicated    Barb Merino, RN, BSN, CCM Care Management Coordinator [facility] Care Management Direct Phone: [phone]

## 2022-06-18 ENCOUNTER — Inpatient Hospital Stay: Payer: Medicare HMO

## 2022-06-18 ENCOUNTER — Inpatient Hospital Stay: Payer: Medicare HMO | Attending: Hematology and Oncology | Admitting: Pharmacist

## 2022-06-18 ENCOUNTER — Other Ambulatory Visit: Payer: Self-pay

## 2022-06-18 VITALS — BP 157/60 | HR 75 | Temp 97.3°F | Resp 18 | Ht 62.0 in | Wt 140.0 lb

## 2022-06-18 DIAGNOSIS — C50912 Malignant neoplasm of unspecified site of left female breast: Secondary | ICD-10-CM | POA: Diagnosis not present

## 2022-06-18 DIAGNOSIS — Z170 Estrogen receptor positive status [ER+]: Secondary | ICD-10-CM | POA: Diagnosis not present

## 2022-06-18 DIAGNOSIS — C7951 Secondary malignant neoplasm of bone: Secondary | ICD-10-CM | POA: Diagnosis not present

## 2022-06-18 DIAGNOSIS — C50919 Malignant neoplasm of unspecified site of unspecified female breast: Secondary | ICD-10-CM

## 2022-06-18 LAB — CMP (CANCER CENTER ONLY)
ALT: 13 U/L (ref 0–44)
AST: 16 U/L (ref 15–41)
Albumin: 4.5 g/dL (ref 3.5–5.0)
Alkaline Phosphatase: 61 U/L (ref 38–126)
Anion gap: 6 (ref 5–15)
BUN: 29 mg/dL — ABNORMAL HIGH (ref 8–23)
CO2: 27 mmol/L (ref 22–32)
Calcium: 10.2 mg/dL (ref 8.9–10.3)
Chloride: 107 mmol/L (ref 98–111)
Creatinine: 1.48 mg/dL — ABNORMAL HIGH (ref 0.44–1.00)
GFR, Estimated: 36 mL/min — ABNORMAL LOW (ref >60)
Glucose, Bld: 84 mg/dL (ref 70–99)
Potassium: 4.7 mmol/L (ref 3.5–5.1)
Sodium: 140 mmol/L (ref 135–145)
Total Bilirubin: 0.3 mg/dL (ref 0.3–1.2)
Total Protein: 7.5 g/dL (ref 6.5–8.1)

## 2022-06-18 LAB — CBC WITH DIFFERENTIAL (CANCER CENTER ONLY)
Abs Immature Granulocytes: 0.01 10*3/uL (ref 0.00–0.07)
Basophils Absolute: 0.1 10*3/uL (ref 0.0–0.1)
Basophils Relative: 2 %
Eosinophils Absolute: 0.1 10*3/uL (ref 0.0–0.5)
Eosinophils Relative: 2 %
HCT: 29.8 % — ABNORMAL LOW (ref 36.0–46.0)
Hemoglobin: 9.6 g/dL — ABNORMAL LOW (ref 12.0–15.0)
Immature Granulocytes: 0 %
Lymphocytes Relative: 36 %
Lymphs Abs: 1.1 10*3/uL (ref 0.7–4.0)
MCH: 26.5 pg (ref 26.0–34.0)
MCHC: 32.2 g/dL (ref 30.0–36.0)
MCV: 82.3 fL (ref 80.0–100.0)
Monocytes Absolute: 0.4 10*3/uL (ref 0.1–1.0)
Monocytes Relative: 14 %
Neutro Abs: 1.4 10*3/uL — ABNORMAL LOW (ref 1.7–7.7)
Neutrophils Relative %: 46 %
Platelet Count: 212 10*3/uL (ref 150–400)
RBC: 3.62 MIL/uL — ABNORMAL LOW (ref 3.87–5.11)
RDW: 15.1 % (ref 11.5–15.5)
WBC Count: 3 10*3/uL — ABNORMAL LOW (ref 4.0–10.5)
nRBC: 0 % (ref 0.0–0.2)

## 2022-06-18 NOTE — Progress Notes (Signed)
[address]       Telephone: [phone]?Fax: [phone]   Oncology Clinical Pharmacist Practitioner Progress Note  Heather Rogers was contacted via in-person to discuss her chemotherapy regimen for abemaciclib which they receive under the care of Dr. Nicholas Lose.   Current treatment regimen and start date Abemaciclib (10/08/21) Letrozole (09/14/21)   Interval History She continues on abemaciclib 50 mg by mouth every 12 hours on days 1 to 28 of a 28-day cycle. This is being given  in combination with letrozole . Therapy is planned to continue until disease progression or unacceptable toxicity.   Response to Therapy Ms. Tavano was seen today by clinical pharmacy as a follow-up to her abemaciclib management.  She is here today with her daughter.  She was last seen by clinical pharmacy on 03/12/22 and Dr. Lindi Adie on 04/23/22.  She had restaging scans on 04/21/22 and Dr. Lindi Adie reviewed those scans at his last visit and we are continuing her current treatment regimen at this time.  Dr. Lindi Adie wanted restaging scans in 3 months and those have been scheduled for 08/25/22. She will see Dr. Lindi Adie on 08/29/21 to review those scans  Clinical pharmacy will see her again tentatively on 09/26/22.  She will continue on the 50 mg every 12 hour dose as the 100 mg every 12 hour dose was not tolerated.  Response to Therapy Ms. Booton is doing well.  She is feeling much better now that she has gone back down to the abemaciclib 50 mg every 12 hour dose.  She is not experiencing any diarrhea, nausea, or vomiting.  She has restaging scans in January of next year and will see Dr. Lindi Adie to review those scans a couple of days later.  Her hydralazine was reduced from 3 times a day to 2 times a day and so her medication list has been updated with these new instructions.  She continues to be followed by her PCP for her blood pressure and diabetes management.  Her ANC and serum creatinine will continue to  be monitored and we have again encouraged Ms. Scaturro to drink plenty of fluids and monitor her urine output. Labs, vitals, treatment parameters, and manufacturer guidelines assessing toxicity were reviewed with [name] today. Based on these values, patient is in agreement to continue abemaciclib therapy at this time.  Allergies Allergies  Allergen Reactions   Sulfa Antibiotics Itching    Vitals    06/18/2022   11:04 AM 05/03/2022   11:07 AM 04/23/2022   11:52 AM  Oncology Vitals  Height 158 cm 158 cm 158 cm  Weight 63.504 kg 63.231 kg 61.735 kg  Weight (lbs) 140 lbs 139 lbs 6 oz 136 lbs 2 oz  BMI 25.61 kg/m2   25.61 kg/m2 25.5 kg/m2   25.5 kg/m2 24.89 kg/m2   24.89 kg/m2  Temp 97.3 F (36.3 C) 98.2 F (36.8 C) 97.3 F (36.3 C)  Pulse Rate 75 76 75  BP 157/60 163/63 148/56  Resp 18  17  SpO2 100 % 99 % 100 %  BSA (m2) 1.67 m2   1.67 m2 1.66 m2   1.66 m2 1.64 m2   1.64 m2    Laboratory Data    Latest Ref Rng & Units 06/18/2022   10:47 AM 03/15/2022    1:30 PM 03/01/2022    3:19 AM  CBC EXTENDED  WBC 4.0 - 10.5 K/uL 3.0  6.8  5.8   RBC 3.87 - 5.11 MIL/uL 3.62  4.06  3.70   Hemoglobin 12.0 - 15.0 g/dL 9.6  10.8  10.1   HCT 36.0 - 46.0 % 29.8  32.7  30.3   Platelets 150 - 400 K/uL 212  299  440   NEUT# 1.7 - 7.7 K/uL 1.4  5.3    Lymph# 0.7 - 4.0 K/uL 1.1  1.0         Latest Ref Rng & Units 06/18/2022   10:47 AM 03/15/2022    1:30 PM 03/01/2022    3:19 AM  CMP  Glucose 70 - 99 mg/dL 84  131  124   BUN 8 - 23 mg/dL _0 Creatinine 0.44 - 1.00 mg/dL 1.48  1.46  0.95   Sodium 135 - 145 mmol/L 140  137  140   Potassium 3.5 - 5.1 mmol/L 4.7  4.3  4.0   Chloride 98 - 111 mmol/L 107  101  113   CO2 22 - 32 mmol/L _1 Calcium 8.9 - 10.3 mg/dL 10.2  10.0  8.5   Total Protein 6.5 - 8.1 g/dL 7.5  8.0    Total Bilirubin 0.3 - 1.2 mg/dL 0.3  0.2    Alkaline Phos 38 - 126 U/L 61  51    AST 15 - 41 U/L 16  24    ALT 0 - 44 U/L 13  25     Adverse  Effects Assessment Low hemoglobin: We will continue to monitor Low neutrophil count: We will continue to monitor Elevated serum creatinine: Discussed and encouraged Ms. Sermons to drink plenty of fluids and monitor urine output.  We will continue to monitor Elevated blood pressure: Ms. Herling and her daughter state that it is usually elevated when in clinic but it has been normal when checking at home.  Ms. Coello continues to take a log of her blood pressures at home and reviews this with her PCP when she sees them.  Adherence Assessment CRISSY MCCREADIE reports missing 0 doses over the past 8 weeks.   Reason for missed dose: N/A Patient was re-educated on importance of adherence.   Access Assessment MARCINA KINNISON is currently receiving her abemaciclib through [employer] concerns: None  Medication Reconciliation The patient's medication list was reviewed today with the patient?  Yes New medications or herbal supplements have recently been started?  No, hydralazine instructions updated as above Any medications have been discontinued?  No The medication list was updated and reconciled based on the patient's most recent medication list in the electronic medical record (EMR) including herbal products and OTC medications.   Medications Current Outpatient Medications  Medication Sig Dispense Refill   abemaciclib ([name]) 50 MG tablet Take 1 tablet (50 mg total) by mouth 2 (two) times daily. Swallow tablets whole. Do not chew, crush, or split tablets before swallowing. 56 tablet 6   Accu-Chek FastClix Lancets MISC Check blood sugar fasting and before meals and again if pt feels bad (symptoms of hypo). 100 each 12   allopurinol (ZYLOPRIM) 100 MG tablet TAKE 1 TABLET EVERY DAY 90 tablet 1   amLODipine (NORVASC) 10 MG tablet TAKE ONE TABLET BY MOUTH DAILY 90 tablet 0   aspirin EC 81 MG tablet Take 81 mg by mouth in the morning. Swallow whole.     atorvastatin  (LIPITOR) 80 MG tablet Take 1 tablet (80 mg total) by mouth every evening. 90 tablet 0   Blood Glucose Monitoring Suppl (  ACCU-CHEK GUIDE) w/Device KIT 1 each by Does not apply route as directed. 1 kit 0   carvedilol (COREG) 25 MG tablet Take 1 tablet (25 mg total) by mouth 2 (two) times daily with a meal. 180 tablet 1   cholecalciferol (VITAMIN D3) 25 MCG (1000 UNIT) tablet Take 1,000 Units by mouth daily.     ferrous sulfate 325 (65 FE) MG EC tablet Take 1 tablet (325 mg total) by mouth in the morning and at bedtime. 60 tablet 1   gabapentin (NEURONTIN) 100 MG capsule Take 1 capsule (100 mg total) by mouth 3 (three) times daily. 270 capsule 1   glucose blood (ACCU-CHEK GUIDE) test strip USE TO CHECK FASTING BLOOD SUGAR AND BEFORE MEALS AND AGAIN IF PATIENT FEELS BAD; SYMPTOMS OF HYPOGLYCEMIA 100 strip 11   hydrALAZINE (APRESOLINE) 100 MG tablet Take 1 tablet (100 mg total) by mouth 3 (three) times daily. (Patient taking differently: Take 100 mg by mouth 2 (two) times daily.) 180 tablet 1   letrozole (FEMARA) 2.5 MG tablet Take 1 tablet (2.5 mg total) by mouth daily. (Patient taking differently: Take 2.5 mg by mouth every evening.) 90 tablet 3   metFORMIN (GLUCOPHAGE) 500 MG tablet Take 1 tablet (500 mg total) by mouth daily with breakfast. TAKE ONE TABLET BY MOUTH TWICE A DAY WITH A MEAL (Patient taking differently: Take 500 mg by mouth daily with breakfast.) 90 tablet 1   Multiple Vitamin (MULTIVITAMIN WITH MINERALS) TABS tablet Take 1 tablet by mouth daily. 30 tablet 0   ondansetron (ZOFRAN) 8 MG tablet Take 1 tablet (8 mg total) by mouth every 8 (eight) hours as needed for nausea or vomiting. 30 tablet 2   polyethylene glycol (MIRALAX / GLYCOLAX) 17 g packet Take 17 g by mouth daily as needed (constipation.).     prochlorperazine (COMPAZINE) 10 MG tablet Take 1 tablet (10 mg total) by mouth every 6 (six) hours as needed for nausea or vomiting. 30 tablet 2   No current facility-administered  medications for this visit.    Drug-Drug Interactions (DDIs) DDIs were evaluated?  Yes Significant DDIs?  No The patient was instructed to speak with their health care provider and/or the oral chemotherapy pharmacist before starting any new drug, including prescription or over the counter, natural / herbal products, or vitamins.  Supportive Care Diarrhea: we reviewed that diarrhea is common with abemaciclib and confirmed that she does have loperamide (Imodium) at home.  We reviewed how to take this medication PRN. Neutropenia: we discussed the importance of having a thermometer and what the Centers for Disease Control and Prevention (CDC) considers a fever which is 100.34F (38C) or higher.  Gave patient 24/7 triage line to call if any fevers or symptoms. ILD/Pneumonitis: we reviewed potential symptoms including cough, shortness, and fatigue.  VTE: reviewed signs of DVT such as leg swelling, redness, pain, or tenderness and signs of PE such as shortness of breath,   Dosing Assessment Hepatic adjustments needed?  No Renal adjustments needed?  No Toxicity adjustments needed?  No, Ms. Olberding will continue on 50 mg every 12 hours.  The abemaciclib will not be increased The current dosing regimen is appropriate to continue at this time.  Follow-Up Plan Continue abemaciclib 50 mg by mouth every 12 hours.  She continues to receive this from [place] Continue letrozole 2.5 mg by mouth daily Continue to follow with PCP for blood pressure and diabetes management Restaging scans scheduled for 08/25/22, with visit with Dr. Lindi Adie on 08/29/22  to review these results Will add labs, pharmacy clinic visit, in approximately 12 weeks Continue to monitor hemoglobin, ANC, serum creatinine  [name] participated in the discussion, expressed understanding, and voiced agreement with the above plan. All questions were answered to her satisfaction. The patient was advised to contact the  clinic at [phone] with any questions or concerns prior to her return visit.   I spent 30 minutes assessing and educating the patient.  Raina Mina, RPH-CPP, 06/18/2022  11:43 AM   **Disclaimer: This note was dictated with voice recognition software. Similar sounding words can inadvertently be transcribed and this note may contain transcription errors which may not have been corrected upon publication of note.**

## 2022-06-19 ENCOUNTER — Telehealth: Payer: Self-pay | Admitting: Pharmacist

## 2022-06-19 NOTE — Telephone Encounter (Signed)
Scheduled appointment per 11/20 los. Left message for the patients daughter [name].

## 2022-06-25 ENCOUNTER — Other Ambulatory Visit: Payer: Self-pay | Admitting: Physician Assistant

## 2022-06-25 ENCOUNTER — Other Ambulatory Visit: Payer: Self-pay | Admitting: Family Medicine

## 2022-06-25 DIAGNOSIS — E1141 Type 2 diabetes mellitus with diabetic mononeuropathy: Secondary | ICD-10-CM

## 2022-06-26 MED ORDER — AMLODIPINE BESYLATE 10 MG PO TABS: 10.0000 mg | ORAL_TABLET | Freq: Every day | ORAL | 0 refills | Status: DC

## 2022-07-02 ENCOUNTER — Other Ambulatory Visit: Payer: Self-pay | Admitting: Family Medicine

## 2022-07-02 DIAGNOSIS — Z8673 Personal history of transient ischemic attack (TIA), and cerebral infarction without residual deficits: Secondary | ICD-10-CM

## 2022-07-02 DIAGNOSIS — I672 Cerebral atherosclerosis: Secondary | ICD-10-CM

## 2022-07-03 ENCOUNTER — Telehealth: Payer: Self-pay | Admitting: *Deleted

## 2022-07-03 NOTE — Progress Notes (Signed)
  Care Coordination Note  07/03/2022 Name: Heather Rogers MRN: [id number] DOB: [birth year]  Heather Rogers is a 76 y.o. year old female who is a primary care patient of Charlott Rakes, MD and is actively engaged with the care management team. I reached out to [name] by phone today to assist with re-scheduling a follow up visit with the RN Case Manager  Follow up plan: Unsuccessful telephone outreach attempt made. A HIPAA compliant phone message was left for the patient providing contact information and requesting a return call.   [place]  Direct Dial: [phone]

## 2022-07-10 NOTE — Telephone Encounter (Signed)
Requesting refill on gabapentin

## 2022-07-11 NOTE — Progress Notes (Unsigned)
  Care Coordination Note  07/11/2022 Name: MAKENZIE WEISNER MRN: [id number] DOB: July 19, 1946  TRANIYA PRICHETT is a 76 y.o. year old female who is a primary care patient of Charlott Rakes, MD and is actively engaged with the care management team. I reached out to [name] by phone today to assist with re-scheduling a follow up visit with the RN Case Manager  Follow up plan: Unsuccessful telephone outreach attempt made.   [place]  Direct Dial: [phone]

## 2022-07-12 NOTE — Progress Notes (Signed)
  Care Coordination Note  07/12/2022 Name: Heather Rogers MRN: [id number] DOB: 1946-07-09  Heather Rogers is a 76 y.o. year old female who is a primary care patient of Charlott Rakes, MD and is actively engaged with the care management team. I reached out to [name] by phone today to assist with re-scheduling a follow up visit with the RN Case Manager  Follow up plan: Unsuccessful telephone outreach attempt made. A HIPAA compliant phone message was left for the patient providing contact information and requesting a return call.  We have been unable to make contact with the patient for follow up. The care management team is available to follow up with the patient after provider conversation with the patient regarding recommendation for care management engagement and subsequent re-referral to the care management team.   [place]  Direct Dial: [phone]

## 2022-07-16 ENCOUNTER — Other Ambulatory Visit: Payer: Self-pay | Admitting: Hematology and Oncology

## 2022-07-31 ENCOUNTER — Encounter: Payer: Self-pay | Admitting: Hematology and Oncology

## 2022-08-01 NOTE — Telephone Encounter (Signed)
John can you please review and refill 

## 2022-08-09 ENCOUNTER — Encounter: Payer: Self-pay | Admitting: Family Medicine

## 2022-08-09 ENCOUNTER — Ambulatory Visit: Payer: Medicare HMO | Attending: Family Medicine | Admitting: Family Medicine

## 2022-08-09 VITALS — BP 162/77 | HR 70 | Ht 62.0 in | Wt 140.8 lb

## 2022-08-09 DIAGNOSIS — I152 Hypertension secondary to endocrine disorders: Secondary | ICD-10-CM

## 2022-08-09 DIAGNOSIS — Z1159 Encounter for screening for other viral diseases: Secondary | ICD-10-CM

## 2022-08-09 DIAGNOSIS — E041 Nontoxic single thyroid nodule: Secondary | ICD-10-CM | POA: Diagnosis not present

## 2022-08-09 DIAGNOSIS — I672 Cerebral atherosclerosis: Secondary | ICD-10-CM

## 2022-08-09 DIAGNOSIS — C50919 Malignant neoplasm of unspecified site of unspecified female breast: Secondary | ICD-10-CM | POA: Diagnosis not present

## 2022-08-09 DIAGNOSIS — E1141 Type 2 diabetes mellitus with diabetic mononeuropathy: Secondary | ICD-10-CM | POA: Diagnosis not present

## 2022-08-09 DIAGNOSIS — Z8673 Personal history of transient ischemic attack (TIA), and cerebral infarction without residual deficits: Secondary | ICD-10-CM | POA: Diagnosis not present

## 2022-08-09 DIAGNOSIS — E1159 Type 2 diabetes mellitus with other circulatory complications: Secondary | ICD-10-CM

## 2022-08-09 LAB — POCT GLYCOSYLATED HEMOGLOBIN (HGB A1C): HbA1c, POC (controlled diabetic range): 5.8 % (ref 0.0–7.0)

## 2022-08-09 MED ORDER — ATORVASTATIN CALCIUM 80 MG PO TABS: 80.0000 mg | ORAL_TABLET | Freq: Every evening | ORAL | 1 refills | Status: DC

## 2022-08-09 MED ORDER — AMLODIPINE BESYLATE 10 MG PO TABS: 10.0000 mg | ORAL_TABLET | Freq: Every day | ORAL | 1 refills | Status: DC

## 2022-08-09 MED ORDER — CARVEDILOL 25 MG PO TABS: 25.0000 mg | ORAL_TABLET | Freq: Two times a day (BID) | ORAL | 1 refills | Status: DC

## 2022-08-09 MED ORDER — HYDRALAZINE HCL 100 MG PO TABS: 100.0000 mg | ORAL_TABLET | Freq: Three times a day (TID) | ORAL | 1 refills | Status: DC

## 2022-08-09 MED ORDER — METFORMIN HCL 500 MG PO TABS: 500.0000 mg | ORAL_TABLET | Freq: Every day | ORAL | 1 refills | Status: DC

## 2022-08-09 NOTE — Patient Instructions (Signed)
Managing Your Hypertension Hypertension, also called high blood pressure, is when the force of the blood pressing against the walls of the arteries is too strong. Arteries are blood vessels that carry blood from your heart throughout your body. Hypertension forces the heart to work harder to pump blood and may cause the arteries to become narrow or stiff. Understanding blood pressure readings A blood pressure reading includes a higher number over a lower number: The first, or top, number is called the systolic pressure. It is a measure of the pressure in your arteries as your heart beats. The second, or bottom number, is called the diastolic pressure. It is a measure of the pressure in your arteries as the heart relaxes. For most people, a normal blood pressure is below 120/80. Your personal target blood pressure may vary depending on your medical conditions, your age, and other factors. Blood pressure is classified into four stages. Based on your blood pressure reading, your health care provider may use the following stages to determine what type of treatment you need, if any. Systolic pressure and diastolic pressure are measured in a unit called millimeters of mercury (mmHg). Normal Systolic pressure: below 120. Diastolic pressure: below 80. Elevated Systolic pressure: 120-129. Diastolic pressure: below 80. Hypertension stage 1 Systolic pressure: 130-139. Diastolic pressure: 80-89. Hypertension stage 2 Systolic pressure: 140 or above. Diastolic pressure: 90 or above. How can this condition affect me? Managing your hypertension is very important. Over time, hypertension can damage the arteries and decrease blood flow to parts of the body, including the brain, heart, and kidneys. Having untreated or uncontrolled hypertension can lead to: A heart attack. A stroke. A weakened blood vessel (aneurysm). Heart failure. Kidney damage. Eye damage. Memory and concentration problems. Vascular  dementia. What actions can I take to manage this condition? Hypertension can be managed by making lifestyle changes and possibly by taking medicines. Your health care provider will help you make a plan to bring your blood pressure within a normal range. You may be referred for counseling on a healthy diet and physical activity. Nutrition  Eat a diet that is high in fiber and potassium, and low in salt (sodium), added sugar, and fat. An example eating plan is called the DASH diet. DASH stands for Dietary Approaches to Stop Hypertension. To eat this way: Eat plenty of fresh fruits and vegetables. Try to fill one-half of your plate at each meal with fruits and vegetables. Eat whole grains, such as whole-wheat pasta, brown rice, or whole-grain bread. Fill about one-fourth of your plate with whole grains. Eat low-fat dairy products. Avoid fatty cuts of meat, processed or cured meats, and poultry with skin. Fill about one-fourth of your plate with lean proteins such as fish, chicken without skin, beans, eggs, and tofu. Avoid pre-made and processed foods. These tend to be higher in sodium, added sugar, and fat. Reduce your daily sodium intake. Many people with hypertension should eat less than 1,500 mg of sodium a day. Lifestyle  Work with your health care provider to maintain a healthy body weight or to lose weight. Ask what an ideal weight is for you. Get at least 30 minutes of exercise that causes your heart to beat faster (aerobic exercise) most days of the week. Activities may include walking, swimming, or biking. Include exercise to strengthen your muscles (resistance exercise), such as weight lifting, as part of your weekly exercise routine. Try to do these types of exercises for 30 minutes at least 3 days a week. Do   not use any products that contain nicotine or tobacco. These products include cigarettes, chewing tobacco, and vaping devices, such as e-cigarettes. If you need help quitting, ask your  health care provider. Control any long-term (chronic) conditions you have, such as high cholesterol or diabetes. Identify your sources of stress and find ways to manage stress. This may include meditation, deep breathing, or making time for fun activities. Alcohol use Do not drink alcohol if: Your health care provider tells you not to drink. You are pregnant, may be pregnant, or are planning to become pregnant. If you drink alcohol: Limit how much you have to: 0-1 drink a day for women. 0-2 drinks a day for men. Know how much alcohol is in your drink. In the [place] one drink equals one 12 oz bottle of beer (355 mL), one 5 oz glass of wine (148 mL), or one 1 oz glass of hard liquor (44 mL). Medicines Your health care provider may prescribe medicine if lifestyle changes are not enough to get your blood pressure under control and if: Your systolic blood pressure is 130 or higher. Your diastolic blood pressure is 80 or higher. Take medicines only as told by your health care provider. Follow the directions carefully. Blood pressure medicines must be taken as told by your health care provider. The medicine does not work as well when you skip doses. Skipping doses also puts you at risk for problems. Monitoring Before you monitor your blood pressure: Do not smoke, drink caffeinated beverages, or exercise within 30 minutes before taking a measurement. Use the bathroom and empty your bladder (urinate). Sit quietly for at least 5 minutes before taking measurements. Monitor your blood pressure at home as told by your health care provider. To do this: Sit with your back straight and supported. Place your feet flat on the floor. Do not cross your legs. Support your arm on a flat surface, such as a table. Make sure your upper arm is at heart level. Each time you measure, take two or three readings one minute apart and record the results. You may also need to have your blood pressure checked regularly by  your health care provider. General information Talk with your health care provider about your diet, exercise habits, and other lifestyle factors that may be contributing to hypertension. Review all the medicines you take with your health care provider because there may be side effects or interactions. Keep all follow-up visits. Your health care provider can help you create and adjust your plan for managing your high blood pressure. Where to find more information National Heart, Lung, and Blood Institute: www.nhlbi.nih.gov American Heart Association: www.heart.org Contact a health care provider if: You think you are having a reaction to medicines you have taken. You have repeated (recurrent) headaches. You feel dizzy. You have swelling in your ankles. You have trouble with your vision. Get help right away if: You develop a severe headache or confusion. You have unusual weakness or numbness, or you feel faint. You have severe pain in your chest or abdomen. You vomit repeatedly. You have trouble breathing. These symptoms may be an emergency. Get help right away. Call 911. Do not wait to see if the symptoms will go away. Do not drive yourself to the hospital. Summary Hypertension is when the force of blood pumping through your arteries is too strong. If this condition is not controlled, it may put you at risk for serious complications. Your personal target blood pressure may vary depending on your medical conditions,   your age, and other factors. For most people, a normal blood pressure is less than 120/80. Hypertension is managed by lifestyle changes, medicines, or both. Lifestyle changes to help manage hypertension include losing weight, eating a healthy, low-sodium diet, exercising more, stopping smoking, and limiting alcohol. This information is not intended to replace advice given to you by your health care provider. Make sure you discuss any questions you have with your health care  provider. Document Revised: 03/30/2021 Document Reviewed: 03/30/2021 Elsevier Patient Education  2023 [employer]  

## 2022-08-09 NOTE — Progress Notes (Signed)
Subjective:  Patient ID: Heather Rogers, female    DOB: [birth year]  Age: 77 y.o. MRN: [id number]  CC: Diabetes   HPI Heather Rogers is a 77 y.o. year old female with a history of type 2 diabetes mellitus (A1c 5.8), hypertension, gout, COVID-19 in 06/2020,, history of CVA, status post loop recorder placement in 2019, metastatic L breast cancer (pulmonary and bony metastasis) She is accompanied by her daughter, Heather Rogers to today's visit.  Interval History:  Last oncology visit was in 03/2022 and she is scheduled for repeat scan CT chest later this month. Last CT chest from 03/2022 revealed: IMPRESSION: 1. Primary left breast mass is minimally smaller. Left axillary lymph nodes are stable. 2. Small residual loculated left pleural effusion, decreased from prior, with similar pleural metastases. 3. Sternal metastasis, as before. 4. 1.8 cm right thyroid nodule. Recommend thyroid ultrasound. (Ref: J Am Coll Radiol. 2015 Feb;12(2): 143-50). 5. Aortic atherosclerosis (ICD10-I70.0). Coronary artery calcification. 6. Enlarged pulmonic trunk, indicative of pulmonary arterial hypertension.     Her BP has been elevated at home. Log reveals 141-164/58-63 and she endorses adherence with her medications.  She does not exercise much but sits on the couch watching TV all day. With regards to her diabetes she has had no hypoglycemia night adherent with metformin.  She plans to see ophthalmology soon for annual eye exams.  A few days ago she did have a droopy eyelid in her right eye with no visual changes, no pain or no itching and used OTC drops with resolution of symptoms. Denies presence of dyspnea, chest pain. Past Medical History:  Diagnosis Date   Chronic kidney disease, stage 3b ([place])    Diabetes ([place])    Hypertension    Pneumonia due to COVID-19 virus 07/25/2020   Primary malignant neoplasm of breast with metastasis ([place]) 09/14/2021   Stroke (cerebrum) ([place])    Stroke ([place]) 05/15/2018     Past Surgical History:  Procedure Laterality Date   CHEST TUBE INSERTION Left 09/07/2021   Procedure: CHEST TUBE INSERTION;  Surgeon: Candee Furbish, MD;  Location: [facility] ENDOSCOPY;  Service: Pulmonary;  Laterality: Left;   CHEST TUBE INSERTION N/A 03/19/2022   Procedure: REMOVAL PLEURAL DRAINAGE CATHETER;  Surgeon: Maryjane Hurter, MD;  Location: [facility] ENDOSCOPY;  Service: Pulmonary;  Laterality: N/A;   EYE SURGERY Left 04/06/2020   Implant placed   EYE SURGERY Right 03/30/2020   Implant placed    hysterectomy     LOOP RECORDER INSERTION N/A 05/20/2018   Procedure: LOOP RECORDER INSERTION;  Surgeon: Thompson Grayer, MD;  Location: [place] CV LAB;  Service: Cardiovascular;  Laterality: N/A;   TEE WITHOUT CARDIOVERSION N/A 05/20/2018   Procedure: TRANSESOPHAGEAL ECHOCARDIOGRAM (TEE);  Surgeon: Sueanne Margarita, MD;  Location: [facility] ENDOSCOPY;  Service: Cardiovascular;  Laterality: N/A;    Family History  Problem Relation Age of Onset   Hypertension Mother    Stroke Mother    Hypertension Father     Social History   Socioeconomic History   Marital status: Divorced    Spouse name: Not on file   Number of children: Not on file   Years of education: Not on file   Highest education level: Not on file  Occupational History   Not on file  Tobacco Use   Smoking status: Former    Packs/day: 1.00    Years: 15.00    Total pack years: 15.00    Types: Cigarettes    Quit date: 07/30/1985  Years since quitting: 37.0   Smokeless tobacco: Never  Vaping Use   Vaping Use: Never used  Substance and Sexual Activity   Alcohol use: Never   Drug use: Never   Sexual activity: Not Currently  Other Topics Concern   Not on file  Social History Narrative   Lives in [place].   Social Determinants of Health   Financial Resource Strain: Not on file  Food Insecurity: No Food Insecurity (03/05/2022)   Hunger Vital Sign    Worried About Running Out of Food in the Last Year: Never true    Ran  Out of Food in the Last Year: Never true  Transportation Needs: No Transportation Needs (03/05/2022)   PRAPARE - Hydrologist (Medical): No    Lack of Transportation (Non-Medical): No  Physical Activity: Not on file  Stress: Not on file  Social Connections: Not on file    Allergies  Allergen Reactions   Sulfa Antibiotics Itching    Outpatient Medications Prior to Visit  Medication Sig Dispense Refill   abemaciclib ([name]) 50 MG tablet Take 1 tablet (50 mg total) by mouth 2 (two) times daily. Swallow tablets whole. Do not chew, crush, or split tablets before swallowing. 56 tablet 6   Accu-Chek FastClix Lancets MISC Check blood sugar fasting and before meals and again if pt feels bad (symptoms of hypo). 100 each 12   allopurinol (ZYLOPRIM) 100 MG tablet TAKE 1 TABLET EVERY DAY 90 tablet 1   aspirin EC 81 MG tablet Take 81 mg by mouth in the morning. Swallow whole.     Blood Glucose Monitoring Suppl (ACCU-CHEK GUIDE) w/Device KIT 1 each by Does not apply route as directed. 1 kit 0   cholecalciferol (VITAMIN D3) 25 MCG (1000 UNIT) tablet Take 1,000 Units by mouth daily.     ferrous sulfate 325 (65 FE) MG EC tablet Take 1 tablet (325 mg total) by mouth in the morning and at bedtime. 60 tablet 1   gabapentin (NEURONTIN) 100 MG capsule TAKE ONE CAPSULE BY MOUTH THREE TIMES A DAY 270 capsule 1   glucose blood (ACCU-CHEK GUIDE) test strip USE TO CHECK FASTING BLOOD SUGAR AND BEFORE MEALS AND AGAIN IF PATIENT FEELS BAD; SYMPTOMS OF HYPOGLYCEMIA 100 strip 11   letrozole (FEMARA) 2.5 MG tablet Take 1 tablet (2.5 mg total) by mouth daily. (Patient taking differently: Take 2.5 mg by mouth every evening.) 90 tablet 3   Multiple Vitamin (MULTIVITAMIN WITH MINERALS) TABS tablet Take 1 tablet by mouth daily. 30 tablet 0   ondansetron (ZOFRAN) 8 MG tablet TAKE ONE TABLET BY MOUTH EVERY 8 HOURS AS NEEDED FOR NAUSEA/VOMITING 30 tablet 2   polyethylene glycol (MIRALAX / GLYCOLAX)  17 g packet Take 17 g by mouth daily as needed (constipation.).     prochlorperazine (COMPAZINE) 10 MG tablet Take 1 tablet (10 mg total) by mouth every 6 (six) hours as needed for nausea or vomiting. 30 tablet 2   amLODipine (NORVASC) 10 MG tablet Take 1 tablet (10 mg total) by mouth daily. 90 tablet 0   amLODipine (NORVASC) 10 MG tablet TAKE ONE TABLET BY MOUTH DAILY 90 tablet 0   atorvastatin (LIPITOR) 80 MG tablet TAKE 1 TABLET EVERY EVENING 90 tablet 0   carvedilol (COREG) 25 MG tablet Take 1 tablet (25 mg total) by mouth 2 (two) times daily with a meal. 180 tablet 1   hydrALAZINE (APRESOLINE) 100 MG tablet Take 1 tablet (100 mg total) by mouth 3 (  three) times daily. (Patient taking differently: Take 100 mg by mouth 2 (two) times daily.) 180 tablet 1   metFORMIN (GLUCOPHAGE) 500 MG tablet Take 1 tablet (500 mg total) by mouth daily with breakfast. TAKE ONE TABLET BY MOUTH TWICE A DAY WITH A MEAL (Patient taking differently: Take 500 mg by mouth daily with breakfast.) 90 tablet 1   No facility-administered medications prior to visit.     ROS Review of Systems  Constitutional:  Negative for activity change and appetite change.  HENT:  Negative for sinus pressure and sore throat.   Respiratory:  Negative for chest tightness, shortness of breath and wheezing.   Cardiovascular:  Negative for chest pain and palpitations.  Gastrointestinal:  Negative for abdominal distention, abdominal pain and constipation.  Genitourinary: Negative.   Musculoskeletal: Negative.   Psychiatric/Behavioral:  Negative for behavioral problems and dysphoric mood.     Objective:  BP (!) 162/77   Pulse 70   Ht '5\' 2"'$  (1.575 m)   Wt 140 lb 12.8 oz (63.9 kg)   SpO2 100%   BMI 25.75 kg/m      08/09/2022   10:29 AM 06/18/2022   11:04 AM 05/03/2022   11:07 AM  BP/Weight  Systolic BP 194 174 081  Diastolic BP 77 60 63  Wt. (Lbs) 140.8 140 139.4  BMI 25.75 kg/m2 25.61 kg/m2 25.5 kg/m2      Physical  Exam Constitutional:      Appearance: She is well-developed.  Cardiovascular:     Rate and Rhythm: Normal rate.     Heart sounds: Normal heart sounds. No murmur heard. Pulmonary:     Effort: Pulmonary effort is normal.     Breath sounds: Normal breath sounds. No wheezing or rales.  Chest:     Chest wall: No tenderness.  Abdominal:     General: Bowel sounds are normal. There is no distension.     Palpations: Abdomen is soft. There is no mass.     Tenderness: There is no abdominal tenderness.  Musculoskeletal:        General: Normal range of motion.     Right lower leg: No edema.     Left lower leg: No edema.  Neurological:     Mental Status: She is alert and oriented to person, place, and time.  Psychiatric:        Mood and Affect: Mood normal.        Latest Ref Rng & Units 06/18/2022   10:47 AM 03/15/2022    1:30 PM 03/01/2022    3:19 AM  CMP  Glucose 70 - 99 mg/dL 84  131  124   BUN 8 - 23 mg/dL '29  28  14   '$ Creatinine 0.44 - 1.00 mg/dL 1.48  1.46  0.95   Sodium 135 - 145 mmol/L 140  137  140   Potassium 3.5 - 5.1 mmol/L 4.7  4.3  4.0   Chloride 98 - 111 mmol/L 107  101  113   CO2 22 - 32 mmol/L '27  26  23   '$ Calcium 8.9 - 10.3 mg/dL 10.2  10.0  8.5   Total Protein 6.5 - 8.1 g/dL 7.5  8.0    Total Bilirubin 0.3 - 1.2 mg/dL 0.3  0.2    Alkaline Phos 38 - 126 U/L 61  51    AST 15 - 41 U/L 16  24    ALT 0 - 44 U/L 13  25      Lipid Panel  Component Value Date/Time   CHOL 115 05/24/2021 1003   TRIG 105 05/24/2021 1003   HDL 38 (L) 05/24/2021 1003   CHOLHDL 3.0 05/24/2021 1003   CHOLHDL 4.7 05/16/2018 0420   VLDL 35 05/16/2018 0420   LDLCALC 57 05/24/2021 1003    CBC    Component Value Date/Time   WBC 3.0 (L) 06/18/2022 1047   WBC 5.8 03/01/2022 0319   RBC 3.62 (L) 06/18/2022 1047   HGB 9.6 (L) 06/18/2022 1047   HGB 10.3 (L) 05/24/2021 1003   HCT 29.8 (L) 06/18/2022 1047   HCT 32.4 (L) 05/24/2021 1003   PLT 212 06/18/2022 1047   PLT 345 05/24/2021 1003    MCV 82.3 06/18/2022 1047   MCV 72 (L) 05/24/2021 1003   MCH 26.5 06/18/2022 1047   MCHC 32.2 06/18/2022 1047   RDW 15.1 06/18/2022 1047   RDW 14.4 05/24/2021 1003   LYMPHSABS 1.1 06/18/2022 1047   LYMPHSABS 1.6 05/24/2021 1003   MONOABS 0.4 06/18/2022 1047   EOSABS 0.1 06/18/2022 1047   EOSABS 0.1 05/24/2021 1003   BASOSABS 0.1 06/18/2022 1047   BASOSABS 0.0 05/24/2021 1003    Lab Results  Component Value Date   HGBA1C 5.8 08/09/2022    Assessment & Plan:  1. Type 2 diabetes mellitus with diabetic mononeuropathy, without long-term current use of insulin (HCC) Controlled with A1c of 5.8 Continue metformin Counseled on Diabetic diet, my plate method, 416 minutes of moderate intensity exercise/week Blood sugar logs with fasting goals of 80-120 mg/dl, random of less than 180 and in the event of sugars less than 60 mg/dl or greater than 400 mg/dl encouraged to notify the clinic. Advised on the need for annual eye exams, annual foot exams, Pneumonia vaccine. - POCT glycosylated hemoglobin (Hb A1C) - metFORMIN (GLUCOPHAGE) 500 MG tablet; Take 1 tablet (500 mg total) by mouth daily with breakfast.  Dispense: 90 tablet; Refill: 1 - CMP14+EGFR  2. Hypertension associated with diabetes ([place]) Uncontrolled She has been administering hydralazine twice daily and has been advised to increase to 100 mg 3 times daily Advised to notify me if she develops hypotension Counseled on blood pressure goal of less than 130/80, low-sodium, DASH diet, medication compliance, 150 minutes of moderate intensity exercise per week. Discussed medication compliance, adverse effects. - hydrALAZINE (APRESOLINE) 100 MG tablet; Take 1 tablet (100 mg total) by mouth 3 (three) times daily.  Dispense: 270 tablet; Refill: 1 - amLODipine (NORVASC) 10 MG tablet; Take 1 tablet (10 mg total) by mouth daily.  Dispense: 90 tablet; Refill: 1 - carvedilol (COREG) 25 MG tablet; Take 1 tablet (25 mg total) by mouth 2 (two) times  daily with a meal.  Dispense: 180 tablet; Refill: 1  3. Atherosclerotic cerebrovascular disease Risk factor modification - atorvastatin (LIPITOR) 80 MG tablet; Take 1 tablet (80 mg total) by mouth every evening.  Dispense: 90 tablet; Refill: 1  4. Status post CVA No residual deficits Secondary prevention Continue high intensity statin - atorvastatin (LIPITOR) 80 MG tablet; Take 1 tablet (80 mg total) by mouth every evening.  Dispense: 90 tablet; Refill: 1  5. Primary malignant neoplasm of breast with metastasis ([place]) Currently on [name] Last CT scan revealed decrease in size of breast mass Follow-up CT scan later this month  6. Need for hepatitis C screening test - HCV Ab w Reflex to Quant PCR  7 Thyroid nodule Noted on last CT chest Will order thyroid ultrasound  Health Care Maintenance: She is undecided about RSV or COVID  vaccines.  Advised to speak with oncologist about receiving shingles vaccine. Meds ordered this encounter  Medications   hydrALAZINE (APRESOLINE) 100 MG tablet    Sig: Take 1 tablet (100 mg total) by mouth 3 (three) times daily.    Dispense:  270 tablet    Refill:  1    Dose increase   amLODipine (NORVASC) 10 MG tablet    Sig: Take 1 tablet (10 mg total) by mouth daily.    Dispense:  90 tablet    Refill:  1   atorvastatin (LIPITOR) 80 MG tablet    Sig: Take 1 tablet (80 mg total) by mouth every evening.    Dispense:  90 tablet    Refill:  1   carvedilol (COREG) 25 MG tablet    Sig: Take 1 tablet (25 mg total) by mouth 2 (two) times daily with a meal.    Dispense:  180 tablet    Refill:  1   metFORMIN (GLUCOPHAGE) 500 MG tablet    Sig: Take 1 tablet (500 mg total) by mouth daily with breakfast.    Dispense:  90 tablet    Refill:  1    Follow-up: Return in about 3 months (around 11/08/2022).       Charlott Rakes, MD, FAAFP. [facility] and [place], [place]   08/09/2022, 11:07 AM

## 2022-08-10 LAB — CMP14+EGFR
ALT: 19 IU/L (ref 0–32)
AST: 20 IU/L (ref 0–40)
Albumin/Globulin Ratio: 1.5 (ref 1.2–2.2)
Albumin: 4.7 g/dL (ref 3.8–4.8)
Alkaline Phosphatase: 79 IU/L (ref 44–121)
BUN/Creatinine Ratio: 16 (ref 12–28)
BUN: 22 mg/dL (ref 8–27)
Bilirubin Total: <0.2 mg/dL (ref 0.0–1.2)
CO2: 20 mmol/L (ref 20–29)
Calcium: 10.2 mg/dL (ref 8.7–10.3)
Chloride: 103 mmol/L (ref 96–106)
Creatinine, Ser: 1.37 mg/dL — ABNORMAL HIGH (ref 0.57–1.00)
Globulin, Total: 3.2 g/dL (ref 1.5–4.5)
Glucose: 89 mg/dL (ref 70–99)
Potassium: 4.7 mmol/L (ref 3.5–5.2)
Sodium: 141 mmol/L (ref 134–144)
Total Protein: 7.9 g/dL (ref 6.0–8.5)
eGFR: 40 mL/min/{1.73_m2} — ABNORMAL LOW (ref >59)

## 2022-08-10 LAB — HCV AB W REFLEX TO QUANT PCR: HCV Ab: NONREACTIVE

## 2022-08-10 LAB — HCV INTERPRETATION

## 2022-08-25 ENCOUNTER — Other Ambulatory Visit (HOSPITAL_COMMUNITY): Payer: Medicare HMO

## 2022-08-27 ENCOUNTER — Other Ambulatory Visit: Payer: Self-pay | Admitting: Pharmacist

## 2022-08-27 ENCOUNTER — Encounter: Payer: Self-pay | Admitting: Hematology and Oncology

## 2022-08-27 MED ORDER — ABEMACICLIB 50 MG PO TABS: 50.0000 mg | ORAL_TABLET | Freq: Two times a day (BID) | ORAL | 6 refills | Status: DC

## 2022-08-28 ENCOUNTER — Ambulatory Visit (HOSPITAL_COMMUNITY)
Admission: RE | Admit: 2022-08-28 | Discharge: 2022-08-28 | Disposition: A | Discharge status: Home / Self Care | Payer: Medicare HMO | Source: Ambulatory Visit | Attending: Hematology and Oncology | Admitting: Hematology and Oncology

## 2022-08-28 ENCOUNTER — Encounter (HOSPITAL_COMMUNITY): Payer: Self-pay

## 2022-08-28 DIAGNOSIS — C50919 Malignant neoplasm of unspecified site of unspecified female breast: Secondary | ICD-10-CM

## 2022-08-28 DIAGNOSIS — E041 Nontoxic single thyroid nodule: Secondary | ICD-10-CM | POA: Insufficient documentation

## 2022-08-28 DIAGNOSIS — I2510 Atherosclerotic heart disease of native coronary artery without angina pectoris: Secondary | ICD-10-CM | POA: Diagnosis not present

## 2022-08-28 DIAGNOSIS — J90 Pleural effusion, not elsewhere classified: Secondary | ICD-10-CM | POA: Diagnosis not present

## 2022-08-28 DIAGNOSIS — R590 Localized enlarged lymph nodes: Secondary | ICD-10-CM | POA: Diagnosis not present

## 2022-08-28 DIAGNOSIS — N6320 Unspecified lump in the left breast, unspecified quadrant: Secondary | ICD-10-CM | POA: Diagnosis not present

## 2022-08-28 DIAGNOSIS — R918 Other nonspecific abnormal finding of lung field: Secondary | ICD-10-CM | POA: Diagnosis not present

## 2022-08-28 DIAGNOSIS — I700 Atherosclerosis of aorta: Secondary | ICD-10-CM | POA: Insufficient documentation

## 2022-08-28 DIAGNOSIS — C7951 Secondary malignant neoplasm of bone: Secondary | ICD-10-CM | POA: Insufficient documentation

## 2022-08-28 NOTE — Assessment & Plan Note (Signed)
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  Hospitalization 09/02/2021-09/08/2021: Shortness of breath, large pleural effusion, thoracentesis, pleural fluid cytology: Metastatic adenocarcinoma breast origin ER 40%, PR 30%, Ki-67 5%, HER2 2+ by IHC, FISH negative ratio 1.43    Current treatment: [name] with letrozole (now reducing to 50 mg p.o. twice daily on 03/15/2022)   Toxicities: Constipation alternating with diarrhea. 2. severe fatigue and lack of interest 3.  Diminished appetite 4.  Severe fatigue   01/15/22: Ct Chest: Large ulcerated breast mass slightly decreased in size. Axill and left IM LN dec in size, Multiple pulmonary and pleural nodules smaller, unchaged sternal met, and left 3 and 4 ribs   Thyroid nodule: We discussed the risks and benefits of obtaining a biopsy.  She is not keen on doing it at this time.   02/22/2022: CT chest: Decreased volume of the pleural fluid.  Unchanged breast mass otherwise stable bone metastasis.  A noncontrasted could not see the lymph nodes very well.   Decreased performance status: She is doing remarkably better since we reduced the dosage of Verzinio.  She does not have any diarrhea.  Energy levels have improved.  Mentation is improved.  She is now willing to continue with the treatment.   CT chest 08/28/22: Decrease in pleural fluid. Pleural nodularity., abnormal LN Left Axillary region, Int mammary and Mediastinum unchanged, sternal mets seen   Unchanged bone metastases.  Decrease in the breast mass. We will plan to rescan her chest in 4 months.

## 2022-08-29 ENCOUNTER — Inpatient Hospital Stay: Payer: Medicare HMO | Attending: Hematology and Oncology | Admitting: Hematology and Oncology

## 2022-08-29 ENCOUNTER — Other Ambulatory Visit: Payer: Self-pay

## 2022-08-29 VITALS — BP 146/57 | HR 71 | Temp 97.3°F | Resp 14 | Wt 141.6 lb

## 2022-08-29 DIAGNOSIS — R5383 Other fatigue: Secondary | ICD-10-CM | POA: Diagnosis not present

## 2022-08-29 DIAGNOSIS — Z170 Estrogen receptor positive status [ER+]: Secondary | ICD-10-CM | POA: Insufficient documentation

## 2022-08-29 DIAGNOSIS — C50919 Malignant neoplasm of unspecified site of unspecified female breast: Secondary | ICD-10-CM | POA: Diagnosis not present

## 2022-08-29 DIAGNOSIS — C7951 Secondary malignant neoplasm of bone: Secondary | ICD-10-CM | POA: Diagnosis not present

## 2022-08-29 DIAGNOSIS — C50912 Malignant neoplasm of unspecified site of left female breast: Secondary | ICD-10-CM | POA: Diagnosis not present

## 2022-08-29 DIAGNOSIS — Z882 Allergy status to sulfonamides status: Secondary | ICD-10-CM | POA: Insufficient documentation

## 2022-08-29 DIAGNOSIS — Z79899 Other long term (current) drug therapy: Secondary | ICD-10-CM | POA: Insufficient documentation

## 2022-08-29 DIAGNOSIS — K5900 Constipation, unspecified: Secondary | ICD-10-CM | POA: Diagnosis not present

## 2022-08-29 DIAGNOSIS — Z79811 Long term (current) use of aromatase inhibitors: Secondary | ICD-10-CM | POA: Diagnosis not present

## 2022-08-29 DIAGNOSIS — R197 Diarrhea, unspecified: Secondary | ICD-10-CM | POA: Diagnosis not present

## 2022-08-29 NOTE — Progress Notes (Signed)
Patient Care Team: Charlott Rakes, MD as PCP - General (Family Medicine) Raina Mina, RPH-CPP as Pharmacist (Hematology and Oncology) Nicholas Lose, MD as Medical Oncologist (Hematology and Oncology) Rex Kras, Claudette Stapler, RN as [place] Management  DIAGNOSIS:  Encounter Diagnosis  Name Primary?   Primary malignant neoplasm of breast with metastasis ([place]) Yes    SUMMARY OF ONCOLOGIC HISTORY: Oncology History  Breast mass, left  09/04/2021 Initial Diagnosis   Breast mass, left   Primary malignant neoplasm of breast with metastasis ([place])  09/14/2021 Initial Diagnosis   Large neglected left breast cancer for several years.  Hospitalization 09/02/2021-09/08/2021: Shortness of breath, large pleural effusion, thoracentesis, pleural fluid cytology: Metastatic adenocarcinoma breast origin ER/PR positive HER2 negative     CHIEF COMPLIANT: Follow-up metastatic breast cancer on [name] and letrozole   INTERVAL HISTORY: Heather Rogers is a 77 y.o. with above-mentioned history of left breast mass, bone lesions, pulmonary nodules, pleural effusion. She presents to the clinic today for follow-up. She denies any pain or discomfort. She denies any diarrhea. She says she just be fatigue a lot. Overall she is doing fine.  She is gaining some weight.  Denies any pain in the breast.  ALLERGIES:  is allergic to sulfa antibiotics.  MEDICATIONS:  Current Outpatient Medications  Medication Sig Dispense Refill   abemaciclib ([name]) 50 MG tablet Take 1 tablet (50 mg total) by mouth 2 (two) times daily. Swallow tablets whole. Do not chew, crush, or split tablets before swallowing. 56 tablet 6   Accu-Chek FastClix Lancets MISC Check blood sugar fasting and before meals and again if pt feels bad (symptoms of hypo). 100 each 12   allopurinol (ZYLOPRIM) 100 MG tablet TAKE 1 TABLET EVERY DAY 90 tablet 1   amLODipine (NORVASC) 10 MG tablet Take 1 tablet (10 mg total) by mouth daily. 90  tablet 1   aspirin EC 81 MG tablet Take 81 mg by mouth in the morning. Swallow whole.     atorvastatin (LIPITOR) 80 MG tablet Take 1 tablet (80 mg total) by mouth every evening. 90 tablet 1   Blood Glucose Monitoring Suppl (ACCU-CHEK GUIDE) w/Device KIT 1 each by Does not apply route as directed. 1 kit 0   carvedilol (COREG) 25 MG tablet Take 1 tablet (25 mg total) by mouth 2 (two) times daily with a meal. 180 tablet 1   cholecalciferol (VITAMIN D3) 25 MCG (1000 UNIT) tablet Take 1,000 Units by mouth daily.     ferrous sulfate 325 (65 FE) MG EC tablet Take 1 tablet (325 mg total) by mouth in the morning and at bedtime. 60 tablet 1   gabapentin (NEURONTIN) 100 MG capsule TAKE ONE CAPSULE BY MOUTH THREE TIMES A DAY 270 capsule 1   glucose blood (ACCU-CHEK GUIDE) test strip USE TO CHECK FASTING BLOOD SUGAR AND BEFORE MEALS AND AGAIN IF PATIENT FEELS BAD; SYMPTOMS OF HYPOGLYCEMIA 100 strip 11   hydrALAZINE (APRESOLINE) 100 MG tablet Take 1 tablet (100 mg total) by mouth 3 (three) times daily. 270 tablet 1   letrozole (FEMARA) 2.5 MG tablet Take 1 tablet (2.5 mg total) by mouth daily. (Patient taking differently: Take 2.5 mg by mouth every evening.) 90 tablet 3   metFORMIN (GLUCOPHAGE) 500 MG tablet Take 1 tablet (500 mg total) by mouth daily with breakfast. 90 tablet 1   Multiple Vitamin (MULTIVITAMIN WITH MINERALS) TABS tablet Take 1 tablet by mouth daily. 30 tablet 0   ondansetron (ZOFRAN) 8 MG tablet TAKE ONE  TABLET BY MOUTH EVERY 8 HOURS AS NEEDED FOR NAUSEA/VOMITING 30 tablet 2   polyethylene glycol (MIRALAX / GLYCOLAX) 17 g packet Take 17 g by mouth daily as needed (constipation.).     prochlorperazine (COMPAZINE) 10 MG tablet Take 1 tablet (10 mg total) by mouth every 6 (six) hours as needed for nausea or vomiting. 30 tablet 2   No current facility-administered medications for this visit.    PHYSICAL EXAMINATION: ECOG PERFORMANCE STATUS: 1 - Symptomatic but completely ambulatory  Vitals:    08/29/22 1024  BP: (!) 146/57  Pulse: 71  Resp: 14  Temp: (!) 97.3 F (36.3 C)  SpO2: 100%   Filed Weights   08/29/22 1024  Weight: 141 lb 9.6 oz (64.2 kg)      LABORATORY DATA:  I have reviewed the data as listed    Latest Ref Rng & Units 08/09/2022   11:01 AM 06/18/2022   10:47 AM 03/15/2022    1:30 PM  CMP  Glucose 70 - 99 mg/dL 89  84  131   BUN 8 - 27 mg/dL '22  29  28   '$ Creatinine 0.57 - 1.00 mg/dL 1.37  1.48  1.46   Sodium 134 - 144 mmol/L 141  140  137   Potassium 3.5 - 5.2 mmol/L 4.7  4.7  4.3   Chloride 96 - 106 mmol/L 103  107  101   CO2 20 - 29 mmol/L '20  27  26   '$ Calcium 8.7 - 10.3 mg/dL 10.2  10.2  10.0   Total Protein 6.0 - 8.5 g/dL 7.9  7.5  8.0   Total Bilirubin 0.0 - 1.2 mg/dL <0.2  0.3  0.2   Alkaline Phos 44 - 121 IU/L 79  61  51   AST 0 - 40 IU/L '20  16  24   '$ ALT 0 - 32 IU/L '19  13  25     '$ Lab Results  Component Value Date   WBC 3.0 (L) 06/18/2022   HGB 9.6 (L) 06/18/2022   HCT 29.8 (L) 06/18/2022   MCV 82.3 06/18/2022   PLT 212 06/18/2022   NEUTROABS 1.4 (L) 06/18/2022    ASSESSMENT & PLAN:  Primary malignant neoplasm of breast with metastasis ([place]) 09/14/2021:Large neglected left breast cancer for several years.  Hospitalization 09/02/2021-09/08/2021: Shortness of breath, large pleural effusion, thoracentesis, pleural fluid cytology: Metastatic adenocarcinoma breast origin ER 40%, PR 30%, Ki-67 5%, HER2 2+ by IHC, FISH negative ratio 1.43    Current treatment: [name] with letrozole (now reducing to 50 mg p.o. twice daily on 03/15/2022)   Toxicities: Constipation alternating with diarrhea. 2. severe fatigue and lack of interest 3.  Diminished appetite 4.  Severe fatigue   01/15/22: Ct Chest: Large ulcerated breast mass slightly decreased in size. Axill and left IM LN dec in size, Multiple pulmonary and pleural nodules smaller, unchaged sternal met, and left 3 and 4 ribs   02/22/2022: CT chest: Decreased volume of the pleural fluid.  Unchanged  breast mass otherwise stable bone metastasis.  A noncontrasted could not see the lymph nodes very well.   Decreased performance status: She is doing remarkably better since we reduced the dosage of Verzinio.  She does not have any diarrhea.  Energy levels have improved.  Mentation is improved.  She is now willing to continue with the treatment.   CT chest 08/28/22: Decrease in pleural fluid. Pleural nodularity., abnormal LN Left Axillary region, Int mammary and Mediastinum unchanged, sternal mets seen. Unchanged  bone metastases.  Decrease in the breast mass.  Bone metastases: We will need to discuss with her about Delton See with her next appointment with Jenny Reichmann.  We will plan to rescan her chest in 4 months. I will see her back in April for labs and follow-up.    No orders of the defined types were placed in this encounter.  The patient has a good understanding of the overall plan. she agrees with it. she will call with any problems that may develop before the next visit here. Total time spent: 30 mins including face to face time and time spent for planning, charting and co-ordination of care   Harriette Ohara, MD 08/29/22    I Gardiner Coins am acting as a Education administrator for [employer]  I have reviewed the above documentation for accuracy and completeness, and I agree with the above.

## 2022-09-10 ENCOUNTER — Inpatient Hospital Stay: Payer: Medicare HMO | Admitting: Pharmacist

## 2022-09-10 ENCOUNTER — Inpatient Hospital Stay: Payer: Medicare HMO | Attending: Hematology and Oncology

## 2022-09-10 VITALS — BP 142/52 | HR 64 | Temp 97.9°F | Resp 16 | Ht 62.0 in | Wt 144.7 lb

## 2022-09-10 DIAGNOSIS — C50919 Malignant neoplasm of unspecified site of unspecified female breast: Secondary | ICD-10-CM

## 2022-09-10 DIAGNOSIS — C50912 Malignant neoplasm of unspecified site of left female breast: Secondary | ICD-10-CM | POA: Diagnosis not present

## 2022-09-10 DIAGNOSIS — Z170 Estrogen receptor positive status [ER+]: Secondary | ICD-10-CM | POA: Diagnosis not present

## 2022-09-10 DIAGNOSIS — C7951 Secondary malignant neoplasm of bone: Secondary | ICD-10-CM | POA: Insufficient documentation

## 2022-09-10 DIAGNOSIS — Z79811 Long term (current) use of aromatase inhibitors: Secondary | ICD-10-CM | POA: Insufficient documentation

## 2022-09-10 LAB — CMP (CANCER CENTER ONLY)
ALT: 17 U/L (ref 0–44)
AST: 17 U/L (ref 15–41)
Albumin: 3.8 g/dL (ref 3.5–5.0)
Alkaline Phosphatase: 66 U/L (ref 38–126)
Anion gap: 6 (ref 5–15)
BUN: 28 mg/dL — ABNORMAL HIGH (ref 8–23)
CO2: 25 mmol/L (ref 22–32)
Calcium: 9.4 mg/dL (ref 8.9–10.3)
Chloride: 106 mmol/L (ref 98–111)
Creatinine: 1.47 mg/dL — ABNORMAL HIGH (ref 0.44–1.00)
GFR, Estimated: 37 mL/min — ABNORMAL LOW (ref >60)
Glucose, Bld: 141 mg/dL — ABNORMAL HIGH (ref 70–99)
Potassium: 4.6 mmol/L (ref 3.5–5.1)
Sodium: 137 mmol/L (ref 135–145)
Total Bilirubin: 0.3 mg/dL (ref 0.3–1.2)
Total Protein: 6.8 g/dL (ref 6.5–8.1)

## 2022-09-10 LAB — CBC WITH DIFFERENTIAL (CANCER CENTER ONLY)
Abs Immature Granulocytes: 0.01 10*3/uL (ref 0.00–0.07)
Basophils Absolute: 0.1 10*3/uL (ref 0.0–0.1)
Basophils Relative: 1 %
Eosinophils Absolute: 0.1 10*3/uL (ref 0.0–0.5)
Eosinophils Relative: 4 %
HCT: 28.3 % — ABNORMAL LOW (ref 36.0–46.0)
Hemoglobin: 9.3 g/dL — ABNORMAL LOW (ref 12.0–15.0)
Immature Granulocytes: 0 %
Lymphocytes Relative: 34 %
Lymphs Abs: 1.2 10*3/uL (ref 0.7–4.0)
MCH: 25.8 pg — ABNORMAL LOW (ref 26.0–34.0)
MCHC: 32.9 g/dL (ref 30.0–36.0)
MCV: 78.6 fL — ABNORMAL LOW (ref 80.0–100.0)
Monocytes Absolute: 0.6 10*3/uL (ref 0.1–1.0)
Monocytes Relative: 16 %
Neutro Abs: 1.7 10*3/uL (ref 1.7–7.7)
Neutrophils Relative %: 45 %
Platelet Count: 235 10*3/uL (ref 150–400)
RBC: 3.6 MIL/uL — ABNORMAL LOW (ref 3.87–5.11)
RDW: 15.4 % (ref 11.5–15.5)
WBC Count: 3.7 10*3/uL — ABNORMAL LOW (ref 4.0–10.5)
nRBC: 0 % (ref 0.0–0.2)

## 2022-09-10 NOTE — Progress Notes (Signed)
[place]       Telephone: [phone]?Fax: [phone]   Oncology Clinical Pharmacist Practitioner Progress Note  Heather Rogers was contacted via in-person to discuss her chemotherapy regimen for abemaciclib which they receive under the care of Dr. Nicholas Lose.   Current treatment regimen and start date Abemaciclib (10/08/21) Letrozole (09/14/21)   Interval History She continues on abemaciclib 50 mg by mouth every 12 hours on days 1 to 28 of a 28-day cycle. This is being given  in combination with letrozole . Therapy is planned to continue until disease progression or unacceptable toxicity. She was last seen by Dr. Lindi Adie on 08/29/22 and clinical pharmacy on 06/18/22. Restaging scans on 08/28/22 were stable per Dr. Lindi Adie. Dr. Lindi Adie wants repeat scans in 3 months and then he will see her to review scans. She prefers to be seen every 3 months. She had an issue with [facility] not sending her prescription on time and did not take abemaciclib for about 7 days. She restarted back on 09/06/22.  Response to Therapy Overall, Heather Rogers is doing well. She recently saw Dr. Margarita Rana who increased her hydralazine to 3 times daily.  Today her blood pressure is improved compared to her past visits.  She also continues to tolerate abemaciclib well.  As above, her restaging scans from Dr. Lindi Adie looked good and he prefers to do them every 3 months.  Her next visit currently is with Dr. Lindi Adie on 11/01/22 but we will do mid May which will coincide with her next restaging scans.  These will be put in today.  Clinical pharmacy will then plan on seeing her in about 6 months.  We did review possibly starting a bone strengthener today per Dr. Geralyn Flash recommendations.  Heather Rogers and her daughter state that Heather Rogers has never been to a dentist and definitely needs some work done prior to starting denosumab.  We reviewed that there is increased risk of side effects and that Dr.  Lindi Adie prefers to wait 3 months after the dental work is completed before starting.  They verbalized understanding of the plan.  [name] to continue to increase fluids and she has gained some weight since her last visit. Labs, vitals, treatment parameters, and manufacturer guidelines assessing toxicity were reviewed with Heather Rogers today. Based on these values, patient is in agreement to continue abemaciclib therapy at this time.  Allergies Allergies  Allergen Reactions   Sulfa Antibiotics Itching    Vitals    09/10/2022   10:10 AM 08/29/2022   10:24 AM 08/09/2022   10:29 AM  Oncology Vitals  Height 158 cm  158 cm  Weight 65.635 kg 64.229 kg 63.866 kg  Weight (lbs) 144 lbs 11 oz 141 lbs 10 oz 140 lbs 13 oz  BMI 26.47 kg/m2   26.47 kg/m2 25.9 kg/m2   25.9 kg/m2 25.75 kg/m2   25.75 kg/m2  Temp 97.9 F (36.6 C) 97.3 F (36.3 C)   Pulse Rate 64 71 70  BP 142/52 146/57 162/77  Resp 16 14   SpO2 99 % 100 % 100 %  BSA (m2) 1.69 m2   1.69 m2 1.68 m2   1.68 m2 1.67 m2   1.67 m2    Laboratory Data    Latest Ref Rng & Units 09/10/2022    9:25 AM 06/18/2022   10:47 AM 03/15/2022    1:30 PM  CBC EXTENDED  WBC 4.0 - 10.5 K/uL 3.7  3.0  6.8   RBC 3.87 - 5.11 MIL/uL 3.60  3.62  4.06   Hemoglobin 12.0 - 15.0 g/dL 9.3  9.6  10.8   HCT 36.0 - 46.0 % 28.3  29.8  32.7   Platelets 150 - 400 K/uL 235  212  299   NEUT# 1.7 - 7.7 K/uL 1.7  1.4  5.3   Lymph# 0.7 - 4.0 K/uL 1.2  1.1  1.0        Latest Ref Rng & Units 09/10/2022    9:25 AM 08/09/2022   11:01 AM 06/18/2022   10:47 AM  CMP  Glucose 70 - 99 mg/dL 141  89  84   BUN 8 - 23 mg/dL 28  22  29   $ Creatinine 0.44 - 1.00 mg/dL 1.47  1.37  1.48   Sodium 135 - 145 mmol/L 137  141  140   Potassium 3.5 - 5.1 mmol/L 4.6  4.7  4.7   Chloride 98 - 111 mmol/L 106  103  107   CO2 22 - 32 mmol/L 25  20  27   $ Calcium 8.9 - 10.3 mg/dL 9.4  10.2  10.2   Total Protein 6.5 - 8.1 g/dL 6.8  7.9  7.5   Total Bilirubin 0.3 - 1.2  mg/dL 0.3  <0.2  0.3   Alkaline Phos 38 - 126 U/L 66  79  61   AST 15 - 41 U/L 17  20  16   $ ALT 0 - 44 U/L 17  19  13     $ Lab Results  Component Value Date   MG 2.7 (H) 02/21/2022   MG 2.4 02/20/2022   MG 2.5 (H) 09/07/2021   No results found for: "CA2729"   Adverse Effects Assessment Hemoglobin: Stable Serum creatinine: Slightly elevated from last visit.  Adherence Assessment Heather Rogers reports missing 0 doses over the past 4 weeks to adherence.  As discussed above, Heather Rogers specialty pharmacy did not send her medications in time and so she did not have abemaciclib for approximately 7 days.   Reason for missed dose: [facility] did not send her medication at time Patient was re-educated on importance of adherence.   Access Assessment Heather Rogers is currently receiving her abemaciclib through [facility] concerns: None  Medication Reconciliation The patient's medication list was reviewed today with the patient?  Yes New medications or herbal supplements have recently been started?  No, hydralazine has been increased to 3 times daily as above.  Dr. Margarita Rana continues to manage Any medications have been discontinued?  No The medication list was updated and reconciled based on the patient's most recent medication list in the electronic medical record (EMR) including herbal products and OTC medications.   Medications Current Outpatient Medications  Medication Sig Dispense Refill   abemaciclib ([name]) 50 MG tablet Take 1 tablet (50 mg total) by mouth 2 (two) times daily. Swallow tablets whole. Do not chew, crush, or split tablets before swallowing. 56 tablet 6   Accu-Chek FastClix Lancets MISC Check blood sugar fasting and before meals and again if pt feels bad (symptoms of hypo). 100 each 12   allopurinol (ZYLOPRIM) 100 MG tablet TAKE 1 TABLET EVERY DAY 90 tablet 1   amLODipine (NORVASC) 10 MG tablet Take 1 tablet (10 mg total)  by mouth daily. 90 tablet 1   aspirin EC 81 MG tablet Take 81 mg by mouth in the morning. Swallow whole.     atorvastatin (LIPITOR) 80 MG  tablet Take 1 tablet (80 mg total) by mouth every evening. 90 tablet 1   Blood Glucose Monitoring Suppl (ACCU-CHEK GUIDE) w/Device KIT 1 each by Does not apply route as directed. 1 kit 0   carvedilol (COREG) 25 MG tablet Take 1 tablet (25 mg total) by mouth 2 (two) times daily with a meal. 180 tablet 1   cholecalciferol (VITAMIN D3) 25 MCG (1000 UNIT) tablet Take 1,000 Units by mouth daily.     ferrous sulfate 325 (65 FE) MG EC tablet Take 1 tablet (325 mg total) by mouth in the morning and at bedtime. 60 tablet 1   gabapentin (NEURONTIN) 100 MG capsule TAKE ONE CAPSULE BY MOUTH THREE TIMES A DAY 270 capsule 1   glucose blood (ACCU-CHEK GUIDE) test strip USE TO CHECK FASTING BLOOD SUGAR AND BEFORE MEALS AND AGAIN IF PATIENT FEELS BAD; SYMPTOMS OF HYPOGLYCEMIA 100 strip 11   hydrALAZINE (APRESOLINE) 100 MG tablet Take 1 tablet (100 mg total) by mouth 3 (three) times daily. 270 tablet 1   letrozole (FEMARA) 2.5 MG tablet Take 1 tablet (2.5 mg total) by mouth daily. (Patient taking differently: Take 2.5 mg by mouth every evening.) 90 tablet 3   metFORMIN (GLUCOPHAGE) 500 MG tablet Take 1 tablet (500 mg total) by mouth daily with breakfast. 90 tablet 1   Multiple Vitamin (MULTIVITAMIN WITH MINERALS) TABS tablet Take 1 tablet by mouth daily. 30 tablet 0   ondansetron (ZOFRAN) 8 MG tablet TAKE ONE TABLET BY MOUTH EVERY 8 HOURS AS NEEDED FOR NAUSEA/VOMITING 30 tablet 2   polyethylene glycol (MIRALAX / GLYCOLAX) 17 g packet Take 17 g by mouth daily as needed (constipation.).     prochlorperazine (COMPAZINE) 10 MG tablet Take 1 tablet (10 mg total) by mouth every 6 (six) hours as needed for nausea or vomiting. 30 tablet 2   No current facility-administered medications for this visit.    Drug-Drug Interactions (DDIs) DDIs were evaluated?  Yes Significant DDIs?  No The  patient was instructed to speak with their health care provider and/or the oral chemotherapy pharmacist before starting any new drug, including prescription or over the counter, natural / herbal products, or vitamins.  Supportive Care Diarrhea: we reviewed that diarrhea is common with abemaciclib and confirmed that she does have loperamide (Imodium) at home.  We reviewed how to take this medication PRN. Neutropenia: we discussed the importance of having a thermometer and what the Centers for Disease Control and Prevention (CDC) considers a fever which is 100.31F (38C) or higher.  Gave patient 24/7 triage line to call if any fevers or symptoms. ILD/Pneumonitis: we reviewed potential symptoms including cough, shortness, and fatigue.  VTE: reviewed signs of DVT such as leg swelling, redness, pain, or tenderness and signs of PE such as shortness of breath, rapid or irregular heartbeat, cough, chest pain, or lightheadedness. Reviewed to take the medication every 12 hours (with food sometimes can be easier on the stomach) and to take it at the same time every day.  Dosing Assessment Hepatic adjustments needed?  No Renal adjustments needed?  No Toxicity adjustments needed?  No The current dosing regimen is appropriate to continue at this time.  Follow-Up Plan Continue abemaciclib 50 mg by mouth every 12 hours.  She continues to receive this from [facility] Continue letrozole 2.5 mg by mouth daily Discussed starting a bone strengthener per Dr. Geralyn Flash recommendations.  Dental clearance form has been given to Heather Rogers and her daughter.  They describe that she does  have poor dental hygiene and has never been to a dentist before.  We discussed that Dr. Lindi Adie prefers dental work be done and then 3 months prior to starting denosumab.  Will reevaluate Dr. Geralyn Flash next visit. Continue to follow with PCP for blood pressure and diabetes management.  Improved appreciate Dr. Smitty Pluck  recommendations. Restaging scans last on 08/28/22.  Discussed with Dr. Lindi Adie and he prefers 3 months for neck scans.  These will be ordered.  We will move his appointment from 11/01/22 to early May so he can review these results at that time. Ms. Kaliszewski prefers every 66-monthvisits with Dr. GLindi Adieand clinical pharmacy.  She knows that she can contact the clinic sooner if needed if any concerns arise.  We will see her back in approximately 6 months.  PGlenna Fellowsparticipated in the discussion, expressed understanding, and voiced agreement with the above plan. All questions were answered to her satisfaction. The patient was advised to contact the clinic at [phone] with any questions or concerns prior to her return visit.   I spent 30 minutes assessing and educating the patient.  JRaina Mina RPH-CPP, 09/10/2022  10:37 AM   **Disclaimer: This note was dictated with voice recognition software. Similar sounding words can inadvertently be transcribed and this note may contain transcription errors which may not have been corrected upon publication of note.**

## 2022-09-11 ENCOUNTER — Other Ambulatory Visit: Payer: Self-pay | Admitting: Physician Assistant

## 2022-09-11 ENCOUNTER — Telehealth: Payer: Self-pay | Admitting: Pharmacist

## 2022-09-11 DIAGNOSIS — E1141 Type 2 diabetes mellitus with diabetic mononeuropathy: Secondary | ICD-10-CM

## 2022-09-11 NOTE — Telephone Encounter (Signed)
Scheduled and rescheduled appointments per 2/12 los. While leaving the voicemail. The phone was disconnected. Patient will be mailed an appointment reminder.

## 2022-09-14 ENCOUNTER — Other Ambulatory Visit: Payer: Self-pay | Admitting: Family Medicine

## 2022-09-14 DIAGNOSIS — Z8673 Personal history of transient ischemic attack (TIA), and cerebral infarction without residual deficits: Secondary | ICD-10-CM

## 2022-09-14 DIAGNOSIS — I672 Cerebral atherosclerosis: Secondary | ICD-10-CM

## 2022-09-14 DIAGNOSIS — E1159 Type 2 diabetes mellitus with other circulatory complications: Secondary | ICD-10-CM

## 2022-09-14 MED ORDER — HYDRALAZINE HCL 100 MG PO TABS: 100.0000 mg | ORAL_TABLET | Freq: Three times a day (TID) | ORAL | 1 refills | Status: DC

## 2022-11-01 ENCOUNTER — Ambulatory Visit: Payer: Medicare HMO | Admitting: Hematology and Oncology

## 2022-11-01 ENCOUNTER — Other Ambulatory Visit: Payer: Medicare HMO

## 2022-11-20 ENCOUNTER — Encounter: Payer: Self-pay | Admitting: Family Medicine

## 2022-11-20 ENCOUNTER — Ambulatory Visit: Payer: Medicare HMO | Attending: Family Medicine | Admitting: Family Medicine

## 2022-11-20 VITALS — BP 161/67 | HR 80 | Ht 62.0 in | Wt 144.6 lb

## 2022-11-20 DIAGNOSIS — E041 Nontoxic single thyroid nodule: Secondary | ICD-10-CM

## 2022-11-20 DIAGNOSIS — I152 Hypertension secondary to endocrine disorders: Secondary | ICD-10-CM | POA: Diagnosis not present

## 2022-11-20 DIAGNOSIS — E1141 Type 2 diabetes mellitus with diabetic mononeuropathy: Secondary | ICD-10-CM | POA: Diagnosis not present

## 2022-11-20 DIAGNOSIS — C50919 Malignant neoplasm of unspecified site of unspecified female breast: Secondary | ICD-10-CM | POA: Diagnosis not present

## 2022-11-20 DIAGNOSIS — Z8673 Personal history of transient ischemic attack (TIA), and cerebral infarction without residual deficits: Secondary | ICD-10-CM | POA: Diagnosis not present

## 2022-11-20 DIAGNOSIS — I672 Cerebral atherosclerosis: Secondary | ICD-10-CM | POA: Diagnosis not present

## 2022-11-20 DIAGNOSIS — E1159 Type 2 diabetes mellitus with other circulatory complications: Secondary | ICD-10-CM | POA: Diagnosis not present

## 2022-11-20 LAB — POCT GLYCOSYLATED HEMOGLOBIN (HGB A1C): HbA1c, POC (controlled diabetic range): 5.6 % (ref 0.0–7.0)

## 2022-11-20 MED ORDER — ATORVASTATIN CALCIUM 80 MG PO TABS: 80.0000 mg | ORAL_TABLET | Freq: Every evening | ORAL | 1 refills | Status: DC

## 2022-11-20 MED ORDER — GABAPENTIN 100 MG PO CAPS: 100.0000 mg | ORAL_CAPSULE | Freq: Three times a day (TID) | ORAL | 1 refills | Status: DC

## 2022-11-20 MED ORDER — SPIRONOLACTONE 25 MG PO TABS
25.0000 mg | ORAL_TABLET | Freq: Every day | ORAL | 1 refills | Status: DC
Start: 2022-11-20 — End: 2023-02-19

## 2022-11-20 MED ORDER — CARVEDILOL 25 MG PO TABS: 25.0000 mg | ORAL_TABLET | Freq: Two times a day (BID) | ORAL | 1 refills | Status: DC

## 2022-11-20 MED ORDER — ALLOPURINOL 100 MG PO TABS: 100.0000 mg | ORAL_TABLET | Freq: Every day | ORAL | 1 refills | Status: DC

## 2022-11-20 MED ORDER — HYDRALAZINE HCL 100 MG PO TABS: 100.0000 mg | ORAL_TABLET | Freq: Three times a day (TID) | ORAL | 1 refills | Status: DC

## 2022-11-20 MED ORDER — AMLODIPINE BESYLATE 10 MG PO TABS: 10.0000 mg | ORAL_TABLET | Freq: Every day | ORAL | 1 refills | Status: DC

## 2022-11-20 NOTE — Patient Instructions (Signed)
Exercising to Stay Healthy To become healthy and stay healthy, it is recommended that you do moderate-intensity and vigorous-intensity exercise. You can tell that you are exercising at a moderate intensity if your heart starts beating faster and you start breathing faster but can still hold a conversation. You can tell that you are exercising at a vigorous intensity if you are breathing much harder and faster and cannot hold a conversation while exercising. How can exercise benefit me? Exercising regularly is important. It has many health benefits, such as: Improving overall fitness, flexibility, and endurance. Increasing bone density. Helping with weight control. Decreasing body fat. Increasing muscle strength and endurance. Reducing stress and tension, anxiety, depression, or anger. Improving overall health. What guidelines should I follow while exercising? Before you start a new exercise program, talk with your health care provider. Do not exercise so much that you hurt yourself, feel dizzy, or get very short of breath. Wear comfortable clothes and wear shoes with good support. Drink plenty of water while you exercise to prevent dehydration or heat stroke. Work out until your breathing and your heartbeat get faster (moderate intensity). How often should I exercise? Choose an activity that you enjoy, and set realistic goals. Your health care provider can help you make an activity plan that is individually designed and works best for you. Exercise regularly as told by your health care provider. This may include: Doing strength training two times a week, such as: Lifting weights. Using resistance bands. Push-ups. Sit-ups. Yoga. Doing a certain intensity of exercise for a given amount of time. Choose from these options: A total of 150 minutes of moderate-intensity exercise every week. A total of 75 minutes of vigorous-intensity exercise every week. A mix of moderate-intensity and  vigorous-intensity exercise every week. Children, pregnant women, people who have not exercised regularly, people who are overweight, and older adults may need to talk with a health care provider about what activities are safe to perform. If you have a medical condition, be sure to talk with your health care provider before you start a new exercise program. What are some exercise ideas? Moderate-intensity exercise ideas include: Walking 1 mile (1.6 km) in about 15 minutes. Biking. Hiking. Golfing. Dancing. Water aerobics. Vigorous-intensity exercise ideas include: Walking 4.5 miles (7.2 km) or more in about 1 hour. Jogging or running 5 miles (8 km) in about 1 hour. Biking 10 miles (16.1 km) or more in about 1 hour. Lap swimming. Roller-skating or in-line skating. Cross-country skiing. Vigorous competitive sports, such as football, basketball, and soccer. Jumping rope. Aerobic dancing. What are some everyday activities that can help me get exercise? Yard work, such as: Pushing a lawn mower. Raking and bagging leaves. Washing your car. Pushing a stroller. Shoveling snow. Gardening. Washing windows or floors. How can I be more active in my day-to-day activities? Use stairs instead of an elevator. Take a walk during your lunch break. If you drive, park your car farther away from your work or school. If you take public transportation, get off one stop early and walk the rest of the way. Stand up or walk around during all of your indoor phone calls. Get up, stretch, and walk around every 30 minutes throughout the day. Enjoy exercise with a friend. Support to continue exercising will help you keep a regular routine of activity. Where to find more information You can find more information about exercising to stay healthy from: U.S. Department of Health and Human Services: www.hhs.gov Centers for Disease Control and Prevention (  CDC): www.cdc.gov Summary Exercising regularly is  important. It will improve your overall fitness, flexibility, and endurance. Regular exercise will also improve your overall health. It can help you control your weight, reduce stress, and improve your bone density. Do not exercise so much that you hurt yourself, feel dizzy, or get very short of breath. Before you start a new exercise program, talk with your health care provider. This information is not intended to replace advice given to you by your health care provider. Make sure you discuss any questions you have with your health care provider. Document Revised: 11/11/2020 Document Reviewed: 11/11/2020 Elsevier Patient Education  2023 Elsevier Inc.  

## 2022-11-20 NOTE — Progress Notes (Signed)
Subjective:  Patient ID: Heather Rogers, female    DOB: [birth year]  Age: 77 y.o. MRN: [id number]  CC: Diabetes   HPI Heather Rogers is a 77 y.o. year old female with a history of type 2 diabetes mellitus (A1c 5.6), hypertension, gout, COVID-19 in 06/2020,, history of CVA, status post loop recorder placement in 2019, metastatic L breast cancer (pulmonary and bony metastasis), thyroid nodule. She is accompanied by her daughter, [name] to today's visit.  Interval History:  BP ranges 136-153 in the systolic range at home and is elevated today.  She endorses adherence with her antihypertensive.  Does not exercise but endorses adherence with a low-sodium diet.  A1c is 5.6 and she remains on metformin daily.  She has not had any hypoglycemic episodes.  She has intermittent numbness in her right leg and is on gabapentin.  Sometimes has blurry vision and next ophthalmology appointment comes up in 3 months. Gout is stable.  Currently under oncology care and is scheduled for CT chest next week and sees oncology next month. I had ordered a thyroid ultrasound 3 months ago for follow-up of incidental finding of thyroid nodule but she is yet to have this done. Denies presence of additional concerns.   Past Medical History:  Diagnosis Date   Chronic kidney disease, stage 3b    Diabetes    Hypertension    Pneumonia due to COVID-19 virus 07/25/2020   Primary malignant neoplasm of breast with metastasis 09/14/2021   Stroke 05/15/2018   Stroke (cerebrum)     Past Surgical History:  Procedure Laterality Date   CHEST TUBE INSERTION Left 09/07/2021   Procedure: CHEST TUBE INSERTION;  Surgeon: Lorin Glass, MD;  Location: [facility] ENDOSCOPY;  Service: Pulmonary;  Laterality: Left;   CHEST TUBE INSERTION N/A 03/19/2022   Procedure: REMOVAL PLEURAL DRAINAGE CATHETER;  Surgeon: Omar Person, MD;  Location: [facility] ENDOSCOPY;  Service: Pulmonary;  Laterality: N/A;   EYE SURGERY Left 04/06/2020    Implant placed   EYE SURGERY Right 03/30/2020   Implant placed    hysterectomy     LOOP RECORDER INSERTION N/A 05/20/2018   Procedure: LOOP RECORDER INSERTION;  Surgeon: Hillis Range, MD;  Location: [facility] INVASIVE CV LAB;  Service: Cardiovascular;  Laterality: N/A;   TEE WITHOUT CARDIOVERSION N/A 05/20/2018   Procedure: TRANSESOPHAGEAL ECHOCARDIOGRAM (TEE);  Surgeon: Quintella Reichert, MD;  Location: [facility] ENDOSCOPY;  Service: Cardiovascular;  Laterality: N/A;    Family History  Problem Relation Age of Onset   Hypertension Mother    Stroke Mother    Hypertension Father     Social History   Socioeconomic History   Marital status: Divorced    Spouse name: Not on file   Number of children: Not on file   Years of education: Not on file   Highest education level: 12th grade  Occupational History   Not on file  Tobacco Use   Smoking status: Former    Packs/day: 1.00    Years: 15.00    Additional pack years: 0.00    Total pack years: 15.00    Types: Cigarettes    Quit date: 07/30/1985    Years since quitting: 37.3   Smokeless tobacco: Never  Vaping Use   Vaping Use: Never used  Substance and Sexual Activity   Alcohol use: Never   Drug use: Never   Sexual activity: Not Currently  Other Topics Concern   Not on file  Social History Narrative   Lives in [place].  Social Determinants of Health   Financial Resource Strain: Low Risk  (11/19/2022)   Overall Financial Resource Strain (CARDIA)    Difficulty of Paying Living Expenses: Not hard at all  Food Insecurity: No Food Insecurity (11/19/2022)   Hunger Vital Sign    Worried About Running Out of Food in the Last Year: Never true    Ran Out of Food in the Last Year: Never true  Transportation Needs: No Transportation Needs (11/19/2022)   PRAPARE - Administrator, Civil Service (Medical): No    Lack of Transportation (Non-Medical): No  Physical Activity: Unknown (11/19/2022)   Exercise Vital Sign    Days of Exercise  per Week: 0 days    Minutes of Exercise per Session: Not on file  Stress: No Stress Concern Present (11/19/2022)   [employer] of Occupational Health - Occupational Stress Questionnaire    Feeling of Stress : Not at all  Social Connections: Moderately Isolated (11/19/2022)   Social Connection and Isolation Panel [NHANES]    Frequency of Communication with Friends and Family: More than three times a week    Frequency of Social Gatherings with Friends and Family: More than three times a week    Attends Religious Services: More than 4 times per year    Active Member of [employer] or Organizations: No    Attends Engineer, structural: Not on file    Marital Status: Divorced    Allergies  Allergen Reactions   Sulfa Antibiotics Itching    Outpatient Medications Prior to Visit  Medication Sig Dispense Refill   abemaciclib ([name]) 50 MG tablet Take 1 tablet (50 mg total) by mouth 2 (two) times daily. Swallow tablets whole. Do not chew, crush, or split tablets before swallowing. 56 tablet 6   Accu-Chek FastClix Lancets MISC USE TO CHECK FASTING BLOOD SUGAR AND BEFORE MEALS AND AGAIN IF PATIENT FEELS BAD (SYMPTOMS OF HYPO) AS DIRECTED BY YOUR PRESCRIBER 102 each 11   aspirin EC 81 MG tablet Take 81 mg by mouth in the morning. Swallow whole.     Blood Glucose Monitoring Suppl (ACCU-CHEK GUIDE) w/Device KIT 1 each by Does not apply route as directed. 1 kit 0   cholecalciferol (VITAMIN D3) 25 MCG (1000 UNIT) tablet Take 1,000 Units by mouth daily.     ferrous sulfate 325 (65 FE) MG EC tablet Take 1 tablet (325 mg total) by mouth in the morning and at bedtime. 60 tablet 1   glucose blood (ACCU-CHEK GUIDE) test strip USE TO CHECK FASTING BLOOD SUGAR AND BEFORE MEALS AND AGAIN IF PATIENT FEELS BAD; SYMPTOMS OF HYPOGLYCEMIA 100 strip 11   letrozole (FEMARA) 2.5 MG tablet Take 1 tablet (2.5 mg total) by mouth daily. (Patient taking differently: Take 2.5 mg by mouth every evening.) 90 tablet 3    Multiple Vitamin (MULTIVITAMIN WITH MINERALS) TABS tablet Take 1 tablet by mouth daily. 30 tablet 0   ondansetron (ZOFRAN) 8 MG tablet TAKE ONE TABLET BY MOUTH EVERY 8 HOURS AS NEEDED FOR NAUSEA/VOMITING 30 tablet 2   polyethylene glycol (MIRALAX / GLYCOLAX) 17 g packet Take 17 g by mouth daily as needed (constipation.).     prochlorperazine (COMPAZINE) 10 MG tablet Take 1 tablet (10 mg total) by mouth every 6 (six) hours as needed for nausea or vomiting. 30 tablet 2   allopurinol (ZYLOPRIM) 100 MG tablet TAKE 1 TABLET EVERY DAY 90 tablet 1   amLODipine (NORVASC) 10 MG tablet Take 1 tablet (10 mg total) by  mouth daily. 90 tablet 1   atorvastatin (LIPITOR) 80 MG tablet TAKE 1 TABLET EVERY EVENING 90 tablet 0   carvedilol (COREG) 25 MG tablet Take 1 tablet (25 mg total) by mouth 2 (two) times daily with a meal. 180 tablet 1   gabapentin (NEURONTIN) 100 MG capsule TAKE ONE CAPSULE BY MOUTH THREE TIMES A DAY 270 capsule 1   hydrALAZINE (APRESOLINE) 100 MG tablet Take 1 tablet (100 mg total) by mouth 3 (three) times daily. 270 tablet 1   metFORMIN (GLUCOPHAGE) 500 MG tablet Take 1 tablet (500 mg total) by mouth daily with breakfast. 90 tablet 1   No facility-administered medications prior to visit.     ROS Review of Systems  Constitutional:  Negative for activity change and appetite change.  HENT:  Negative for sinus pressure and sore throat.   Respiratory:  Negative for chest tightness, shortness of breath and wheezing.   Cardiovascular:  Negative for chest pain and palpitations.  Gastrointestinal:  Negative for abdominal distention, abdominal pain and constipation.  Genitourinary: Negative.   Musculoskeletal: Negative.   Neurological:  Positive for numbness.  Psychiatric/Behavioral:  Negative for behavioral problems and dysphoric mood.     Objective:  BP (!) 184/63   Pulse 80   Ht 5\' 2"  (1.575 m)   Wt 144 lb 9.6 oz (65.6 kg)   SpO2 99%   BMI 26.45 kg/m      11/20/2022   10:59 AM  09/10/2022   10:10 AM 08/29/2022   10:24 AM  BP/Weight  Systolic BP 184 142 146  Diastolic BP 63 52 57  Wt. (Lbs) 144.6 144.7 141.6  BMI 26.45 kg/m2 26.47 kg/m2 25.9 kg/m2      Physical Exam Constitutional:      Appearance: She is well-developed.  Cardiovascular:     Rate and Rhythm: Normal rate.     Heart sounds: Normal heart sounds. No murmur heard. Pulmonary:     Effort: Pulmonary effort is normal.     Breath sounds: Normal breath sounds. No wheezing or rales.  Chest:     Chest wall: No tenderness.  Abdominal:     General: Bowel sounds are normal. There is no distension.     Palpations: Abdomen is soft. There is no mass.     Tenderness: There is no abdominal tenderness.  Musculoskeletal:        General: Normal range of motion.     Right lower leg: No edema.     Left lower leg: No edema.  Neurological:     Mental Status: She is alert and oriented to person, place, and time.  Psychiatric:        Mood and Affect: Mood normal.        Latest Ref Rng & Units 09/10/2022    9:25 AM 08/09/2022   11:01 AM 06/18/2022   10:47 AM  CMP  Glucose 70 - 99 mg/dL 161  89  84   BUN 8 - 23 mg/dL 28  22  29    Creatinine 0.44 - 1.00 mg/dL 0.96  0.45  4.09   Sodium 135 - 145 mmol/L 137  141  140   Potassium 3.5 - 5.1 mmol/L 4.6  4.7  4.7   Chloride 98 - 111 mmol/L 106  103  107   CO2 22 - 32 mmol/L 25  20  27    Calcium 8.9 - 10.3 mg/dL 9.4  81.1  91.4   Total Protein 6.5 - 8.1 g/dL 6.8  7.9  7.5  Total Bilirubin 0.3 - 1.2 mg/dL 0.3  <4.0  0.3   Alkaline Phos 38 - 126 U/L 66  79  61   AST 15 - 41 U/L ALT 0 - 44 U/L Lipid Panel     Component Value Date/Time   CHOL 115 05/24/2021 1003   TRIG 105 05/24/2021 1003   HDL 38 (L) 05/24/2021 1003   CHOLHDL 3.0 05/24/2021 1003   CHOLHDL 4.7 05/16/2018 0420   VLDL 35 05/16/2018 0420   LDLCALC 57 05/24/2021 1003    CBC    Component Value Date/Time   WBC 3.7 (L) 09/10/2022 0925   WBC 5.8 03/01/2022  0319   RBC 3.60 (L) 09/10/2022 0925   HGB 9.3 (L) 09/10/2022 0925   HGB 10.3 (L) 05/24/2021 1003   HCT 28.3 (L) 09/10/2022 0925   HCT 32.4 (L) 05/24/2021 1003   PLT 235 09/10/2022 0925   PLT 345 05/24/2021 1003   MCV 78.6 (L) 09/10/2022 0925   MCV 72 (L) 05/24/2021 1003   MCH 25.8 (L) 09/10/2022 0925   MCHC 32.9 09/10/2022 0925   RDW 15.4 09/10/2022 0925   RDW 14.4 05/24/2021 1003   LYMPHSABS 1.2 09/10/2022 0925   LYMPHSABS 1.6 05/24/2021 1003   MONOABS 0.6 09/10/2022 0925   EOSABS 0.1 09/10/2022 0925   EOSABS 0.1 05/24/2021 1003   BASOSABS 0.1 09/10/2022 0925   BASOSABS 0.0 05/24/2021 1003    Lab Results  Component Value Date   HGBA1C 5.6 11/20/2022    Assessment & Plan:  1. Type 2 diabetes mellitus with diabetic mononeuropathy, without long-term current use of insulin Controlled with A1c of 5.6 Discontinue metformin to prevent hypoglycemia She will remain on diet control Counseled on Diabetic diet, my plate method, 981 minutes of moderate intensity exercise/week Blood sugar logs with fasting goals of 80-120 mg/dl, random of less than 191 and in the event of sugars less than 60 mg/dl or greater than 478 mg/dl encouraged to notify the clinic. Advised on the need for annual eye exams, annual foot exams, Pneumonia vaccine. - POCT glycosylated hemoglobin (Hb A1C) - gabapentin (NEURONTIN) 100 MG capsule; Take 1 capsule (100 mg total) by mouth 3 (three) times daily.  Dispense: 270 capsule; Refill: 1 - Microalbumin/Creatinine Ratio, Urine  2. Hypertension associated with diabetes Uncontrolled Spironolactone added to her regimen She is scheduled for lab in 2 weeks and her potassium will be checked at that time with oncology Counseled on blood pressure goal of less than 130/80, low-sodium, DASH diet, medication compliance, 150 minutes of moderate intensity exercise per week. Discussed medication compliance, adverse effects. - amLODipine (NORVASC) 10 MG tablet; Take 1 tablet (10  mg total) by mouth daily.  Dispense: 90 tablet; Refill: 1 - carvedilol (COREG) 25 MG tablet; Take 1 tablet (25 mg total) by mouth 2 (two) times daily with a meal.  Dispense: 180 tablet; Refill: 1 - hydrALAZINE (APRESOLINE) 100 MG tablet; Take 1 tablet (100 mg total) by mouth 3 (three) times daily.  Dispense: 270 tablet; Refill: 1 - spironolactone (ALDACTONE) 25 MG tablet; Take 1 tablet (25 mg total) by mouth daily.  Dispense: 90 tablet; Refill: 1  3. Atherosclerotic cerebrovascular disease Due for lipid panel but is not fasting today so we will hold off on labs - atorvastatin (LIPITOR) 80 MG tablet; Take 1 tablet (80 mg total) by mouth every evening.  Dispense: 90 tablet; Refill: 1  4. Status post  CVA No residual deficits Secondary risk factor modification - atorvastatin (LIPITOR) 80 MG tablet; Take 1 tablet (80 mg total) by mouth every evening.  Dispense: 90 tablet; Refill: 1  5. Primary malignant neoplasm of breast with metastasis Currently on treatment with [name] and letrozole Continue to follow-up with oncology  6.  Thyroid nodule Incidental finding Thyroid ultrasound ordered but due to daughter's schedule she was never able to undergo this We scheduled an appointment for tomorrow for this ultrasound but daughter cannot make it tomorrow.  We have provided the number to radiology so daughter can call and schedule according to her availability.    Meds ordered this encounter  Medications   allopurinol (ZYLOPRIM) 100 MG tablet    Sig: Take 1 tablet (100 mg total) by mouth daily.    Dispense:  90 tablet    Refill:  1   amLODipine (NORVASC) 10 MG tablet    Sig: Take 1 tablet (10 mg total) by mouth daily.    Dispense:  90 tablet    Refill:  1   atorvastatin (LIPITOR) 80 MG tablet    Sig: Take 1 tablet (80 mg total) by mouth every evening.    Dispense:  90 tablet    Refill:  1   carvedilol (COREG) 25 MG tablet    Sig: Take 1 tablet (25 mg total) by mouth 2 (two) times daily  with a meal.    Dispense:  180 tablet    Refill:  1   gabapentin (NEURONTIN) 100 MG capsule    Sig: Take 1 capsule (100 mg total) by mouth 3 (three) times daily.    Dispense:  270 capsule    Refill:  1   hydrALAZINE (APRESOLINE) 100 MG tablet    Sig: Take 1 tablet (100 mg total) by mouth 3 (three) times daily.    Dispense:  270 tablet    Refill:  1   spironolactone (ALDACTONE) 25 MG tablet    Sig: Take 1 tablet (25 mg total) by mouth daily.    Dispense:  90 tablet    Refill:  1    Follow-up: Return in about 3 months (around 02/19/2023) for Chronic medical conditions.       [name], MD, FAAFP. [facility] [place], [place] [phone]   11/20/2022, 11:43 AM

## 2022-11-26 ENCOUNTER — Other Ambulatory Visit: Payer: Self-pay | Admitting: Family Medicine

## 2022-11-26 DIAGNOSIS — I672 Cerebral atherosclerosis: Secondary | ICD-10-CM

## 2022-11-26 DIAGNOSIS — Z8673 Personal history of transient ischemic attack (TIA), and cerebral infarction without residual deficits: Secondary | ICD-10-CM

## 2022-11-27 ENCOUNTER — Ambulatory Visit (HOSPITAL_COMMUNITY)
Admission: RE | Admit: 2022-11-27 | Discharge: 2022-11-27 | Disposition: A | Discharge status: Home / Self Care | Payer: Medicare HMO | Source: Ambulatory Visit | Attending: Hematology and Oncology | Admitting: Hematology and Oncology

## 2022-11-27 DIAGNOSIS — J90 Pleural effusion, not elsewhere classified: Secondary | ICD-10-CM | POA: Insufficient documentation

## 2022-11-27 DIAGNOSIS — R918 Other nonspecific abnormal finding of lung field: Secondary | ICD-10-CM | POA: Diagnosis not present

## 2022-11-27 DIAGNOSIS — R911 Solitary pulmonary nodule: Secondary | ICD-10-CM | POA: Diagnosis not present

## 2022-11-27 DIAGNOSIS — C50919 Malignant neoplasm of unspecified site of unspecified female breast: Secondary | ICD-10-CM | POA: Insufficient documentation

## 2022-11-27 DIAGNOSIS — I700 Atherosclerosis of aorta: Secondary | ICD-10-CM | POA: Diagnosis not present

## 2022-11-27 DIAGNOSIS — C7951 Secondary malignant neoplasm of bone: Secondary | ICD-10-CM | POA: Diagnosis not present

## 2022-11-29 ENCOUNTER — Telehealth: Payer: Self-pay | Admitting: Family Medicine

## 2022-11-29 NOTE — Telephone Encounter (Signed)
Contacted Heather Rogers to schedule their annual wellness visit. Appointment made for 12/05/2022.  Thank you,  [facility] Support [facility] Direct dial  [phone]

## 2022-12-01 NOTE — Progress Notes (Signed)
Patient Care Team: [name], MD as PCP - General (Family Medicine) Anselm Lis, RPH-CPP as Pharmacist (Hematology and Oncology) Serena Croissant, MD as Medical Oncologist (Hematology and Oncology) Clarene Duke Karma Lew, RN as [facility] Care Management  DIAGNOSIS: No diagnosis found.  SUMMARY OF ONCOLOGIC HISTORY: Oncology History  Breast mass, left  09/04/2021 Initial Diagnosis   Breast mass, left   Primary malignant neoplasm of breast with metastasis (HCC)  09/14/2021 Initial Diagnosis   Large neglected left breast cancer for several years.  Hospitalization 09/02/2021-09/08/2021: Shortness of breath, large pleural effusion, thoracentesis, pleural fluid cytology: Metastatic adenocarcinoma breast origin ER/PR positive HER2 negative     CHIEF COMPLIANT: Follow-up metastatic breast cancer on [name] and letrozole   INTERVAL HISTORY: Heather Rogers is a 77 y.o. with above-mentioned history of left breast mass, bone lesions, pulmonary nodules, pleural effusion. She presents to the clinic today for follow-up.    ALLERGIES:  is allergic to sulfa antibiotics.  MEDICATIONS:  Current Outpatient Medications  Medication Sig Dispense Refill   abemaciclib ([name]) 50 MG tablet Take 1 tablet (50 mg total) by mouth 2 (two) times daily. Swallow tablets whole. Do not chew, crush, or split tablets before swallowing. 56 tablet 6   Accu-Chek FastClix Lancets MISC USE TO CHECK FASTING BLOOD SUGAR AND BEFORE MEALS AND AGAIN IF PATIENT FEELS BAD (SYMPTOMS OF HYPO) AS DIRECTED BY YOUR PRESCRIBER 102 each 11   allopurinol (ZYLOPRIM) 100 MG tablet Take 1 tablet (100 mg total) by mouth daily. 90 tablet 1   amLODipine (NORVASC) 10 MG tablet Take 1 tablet (10 mg total) by mouth daily. 90 tablet 1   aspirin EC 81 MG tablet Take 81 mg by mouth in the morning. Swallow whole.     atorvastatin (LIPITOR) 80 MG tablet TAKE 1 TABLET EVERY EVENING 90 tablet 0   Blood Glucose Monitoring Suppl  (ACCU-CHEK GUIDE) w/Device KIT 1 each by Does not apply route as directed. 1 kit 0   carvedilol (COREG) 25 MG tablet Take 1 tablet (25 mg total) by mouth 2 (two) times daily with a meal. 180 tablet 1   cholecalciferol (VITAMIN D3) 25 MCG (1000 UNIT) tablet Take 1,000 Units by mouth daily.     ferrous sulfate 325 (65 FE) MG EC tablet Take 1 tablet (325 mg total) by mouth in the morning and at bedtime. 60 tablet 1   gabapentin (NEURONTIN) 100 MG capsule Take 1 capsule (100 mg total) by mouth 3 (three) times daily. 270 capsule 1   glucose blood (ACCU-CHEK GUIDE) test strip USE TO CHECK FASTING BLOOD SUGAR AND BEFORE MEALS AND AGAIN IF PATIENT FEELS BAD; SYMPTOMS OF HYPOGLYCEMIA 100 strip 11   hydrALAZINE (APRESOLINE) 100 MG tablet Take 1 tablet (100 mg total) by mouth 3 (three) times daily. 270 tablet 1   letrozole (FEMARA) 2.5 MG tablet Take 1 tablet (2.5 mg total) by mouth daily. (Patient taking differently: Take 2.5 mg by mouth every evening.) 90 tablet 3   Multiple Vitamin (MULTIVITAMIN WITH MINERALS) TABS tablet Take 1 tablet by mouth daily. 30 tablet 0   ondansetron (ZOFRAN) 8 MG tablet TAKE ONE TABLET BY MOUTH EVERY 8 HOURS AS NEEDED FOR NAUSEA/VOMITING 30 tablet 2   polyethylene glycol (MIRALAX / GLYCOLAX) 17 g packet Take 17 g by mouth daily as needed (constipation.).     prochlorperazine (COMPAZINE) 10 MG tablet Take 1 tablet (10 mg total) by mouth every 6 (six) hours as needed for nausea or vomiting. 30 tablet 2  spironolactone (ALDACTONE) 25 MG tablet Take 1 tablet (25 mg total) by mouth daily. 90 tablet 1   No current facility-administered medications for this visit.    PHYSICAL EXAMINATION: ECOG PERFORMANCE STATUS: {CHL ONC ECOG [id number]}  There were no vitals filed for this visit. There were no vitals filed for this visit.  BREAST:*** No palpable masses or nodules in either right or left breasts. No palpable axillary supraclavicular or infraclavicular adenopathy no breast  tenderness or nipple discharge. (exam performed in the presence of a chaperone)  LABORATORY DATA:  I have reviewed the data as listed    Latest Ref Rng & Units 09/10/2022    9:25 AM 08/09/2022   11:01 AM 06/18/2022   10:47 AM  CMP  Glucose 70 - 99 mg/dL 161  89  84   BUN 8 - 23 mg/dL 28  22  29    Creatinine 0.44 - 1.00 mg/dL 0.96  0.45  4.09   Sodium 135 - 145 mmol/L 137  141  140   Potassium 3.5 - 5.1 mmol/L 4.6  4.7  4.7   Chloride 98 - 111 mmol/L 106  103  107   CO2 22 - 32 mmol/L 25  20  27    Calcium 8.9 - 10.3 mg/dL 9.4  81.1  91.4   Total Protein 6.5 - 8.1 g/dL 6.8  7.9  7.5   Total Bilirubin 0.3 - 1.2 mg/dL 0.3  <7.8  0.3   Alkaline Phos 38 - 126 U/L 66  79  61   AST 15 - 41 U/L 17  20  16    ALT 0 - 44 U/L 17  19  13      Lab Results  Component Value Date   WBC 3.7 (L) 09/10/2022   HGB 9.3 (L) 09/10/2022   HCT 28.3 (L) 09/10/2022   MCV 78.6 (L) 09/10/2022   PLT 235 09/10/2022   NEUTROABS 1.7 09/10/2022    ASSESSMENT & PLAN:  No problem-specific Assessment & Plan notes found for this encounter.    No orders of the defined types were placed in this encounter.  The patient has a good understanding of the overall plan. she agrees with it. she will call with any problems that may develop before the next visit here. Total time spent: 30 mins including face to face time and time spent for planning, charting and co-ordination of care   Sherlyn Lick, CMA 12/01/22    I Janan Ridge am acting as a Neurosurgeon for [employer]  ***

## 2022-12-04 ENCOUNTER — Inpatient Hospital Stay (HOSPITAL_BASED_OUTPATIENT_CLINIC_OR_DEPARTMENT_OTHER): Payer: Medicare HMO | Admitting: Hematology and Oncology

## 2022-12-04 ENCOUNTER — Inpatient Hospital Stay: Payer: Medicare HMO | Attending: Hematology and Oncology

## 2022-12-04 VITALS — BP 167/54 | HR 67 | Temp 97.2°F | Resp 18 | Ht 62.0 in | Wt 142.7 lb

## 2022-12-04 DIAGNOSIS — R630 Anorexia: Secondary | ICD-10-CM | POA: Diagnosis not present

## 2022-12-04 DIAGNOSIS — R197 Diarrhea, unspecified: Secondary | ICD-10-CM | POA: Insufficient documentation

## 2022-12-04 DIAGNOSIS — Z79899 Other long term (current) drug therapy: Secondary | ICD-10-CM | POA: Insufficient documentation

## 2022-12-04 DIAGNOSIS — Z79811 Long term (current) use of aromatase inhibitors: Secondary | ICD-10-CM | POA: Diagnosis not present

## 2022-12-04 DIAGNOSIS — Z170 Estrogen receptor positive status [ER+]: Secondary | ICD-10-CM | POA: Insufficient documentation

## 2022-12-04 DIAGNOSIS — C50919 Malignant neoplasm of unspecified site of unspecified female breast: Secondary | ICD-10-CM

## 2022-12-04 DIAGNOSIS — C50912 Malignant neoplasm of unspecified site of left female breast: Secondary | ICD-10-CM | POA: Insufficient documentation

## 2022-12-04 DIAGNOSIS — R490 Dysphonia: Secondary | ICD-10-CM | POA: Insufficient documentation

## 2022-12-04 DIAGNOSIS — R5383 Other fatigue: Secondary | ICD-10-CM | POA: Insufficient documentation

## 2022-12-04 DIAGNOSIS — K5900 Constipation, unspecified: Secondary | ICD-10-CM | POA: Insufficient documentation

## 2022-12-04 LAB — CBC WITH DIFFERENTIAL (CANCER CENTER ONLY)
Abs Immature Granulocytes: 0 10*3/uL (ref 0.00–0.07)
Basophils Absolute: 0 10*3/uL (ref 0.0–0.1)
Basophils Relative: 1 %
Eosinophils Absolute: 0.1 10*3/uL (ref 0.0–0.5)
Eosinophils Relative: 2 %
HCT: 31.9 % — ABNORMAL LOW (ref 36.0–46.0)
Hemoglobin: 10.4 g/dL — ABNORMAL LOW (ref 12.0–15.0)
Immature Granulocytes: 0 %
Lymphocytes Relative: 36 %
Lymphs Abs: 1.2 10*3/uL (ref 0.7–4.0)
MCH: 26.5 pg (ref 26.0–34.0)
MCHC: 32.6 g/dL (ref 30.0–36.0)
MCV: 81.4 fL (ref 80.0–100.0)
Monocytes Absolute: 0.4 10*3/uL (ref 0.1–1.0)
Monocytes Relative: 11 %
Neutro Abs: 1.7 10*3/uL (ref 1.7–7.7)
Neutrophils Relative %: 50 %
Platelet Count: 216 10*3/uL (ref 150–400)
RBC: 3.92 MIL/uL (ref 3.87–5.11)
RDW: 14.7 % (ref 11.5–15.5)
WBC Count: 3.4 10*3/uL — ABNORMAL LOW (ref 4.0–10.5)
nRBC: 0 % (ref 0.0–0.2)

## 2022-12-04 LAB — CMP (CANCER CENTER ONLY)
ALT: 20 U/L (ref 0–44)
AST: 22 U/L (ref 15–41)
Albumin: 4.2 g/dL (ref 3.5–5.0)
Alkaline Phosphatase: 70 U/L (ref 38–126)
Anion gap: 6 (ref 5–15)
BUN: 30 mg/dL — ABNORMAL HIGH (ref 8–23)
CO2: 25 mmol/L (ref 22–32)
Calcium: 9.6 mg/dL (ref 8.9–10.3)
Chloride: 104 mmol/L (ref 98–111)
Creatinine: 1.58 mg/dL — ABNORMAL HIGH (ref 0.44–1.00)
GFR, Estimated: 34 mL/min — ABNORMAL LOW (ref >60)
Glucose, Bld: 87 mg/dL (ref 70–99)
Potassium: 5.3 mmol/L — ABNORMAL HIGH (ref 3.5–5.1)
Sodium: 135 mmol/L (ref 135–145)
Total Bilirubin: 0.4 mg/dL (ref 0.3–1.2)
Total Protein: 7.8 g/dL (ref 6.5–8.1)

## 2022-12-04 NOTE — Assessment & Plan Note (Addendum)
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  Hospitalization 09/02/2021-09/08/2021: Shortness of breath, large pleural effusion, thoracentesis, pleural fluid cytology: Metastatic adenocarcinoma breast origin ER 40%, PR 30%, Ki-67 5%, HER2 2+ by IHC, FISH negative ratio 1.43    Current treatment: [name] with letrozole (now reducing to 50 mg p.o. twice daily on 03/15/2022)   Toxicities: Constipation alternating with diarrhea. 2. severe fatigue and lack of interest 3.  Diminished appetite 4.  Severe fatigue  CT chest 11/30/2022: Interval decrease in size of left axillary and left internal mammary chain lymph nodes.  Left upper lobe subpleural nodule minimally decreased in size, scattered tiny left-sided Peri- fissural and subpleural nodules are similar.  Decrease in the left pleural effusion.  Metastases to the sternum as before  Ultrasound of the thyroid has been ordered  Continue current treatment since he has excellent response to treatment.  We will obtain CT chest in 6 months.  She has an appointment to see Jonny Ruiz in 3 months.

## 2022-12-05 ENCOUNTER — Ambulatory Visit: Payer: Medicare HMO | Attending: Family Medicine

## 2022-12-05 VITALS — Ht 62.0 in | Wt 142.0 lb

## 2022-12-05 DIAGNOSIS — Z0000 Encounter for general adult medical examination without abnormal findings: Secondary | ICD-10-CM | POA: Diagnosis not present

## 2022-12-05 NOTE — Progress Notes (Signed)
Subjective:   Heather Rogers is a 77 y.o. female who presents for an Initial Medicare Annual Wellness Visit.  I connected with  Dionne Ano on 12/05/22 by a audio enabled telemedicine application and verified that I am speaking with the correct person using two identifiers.  Patient Location: Home  Provider Location: Home Office  I discussed the limitations of evaluation and management by telemedicine. The patient expressed understanding and agreed to proceed.  Review of Systems     Cardiac Risk Factors include: advanced age (>58men, >87 women);diabetes mellitus;hypertension;sedentary lifestyle;dyslipidemia     Objective:    Today's Vitals   12/05/22 1104  Weight: 142 lb (64.4 kg)  Height: 5\' 2"  (1.575 m)   Body mass index is 25.97 kg/m.     12/05/2022    1:40 PM 12/04/2022   11:09 AM 08/29/2022   10:28 AM 04/23/2022   12:09 PM 03/05/2022    2:19 PM 02/21/2022    6:00 PM 02/19/2022    3:28 PM  Advanced Directives  Does Patient Have a Medical Advance Directive? No Yes Yes Yes No No No  Type of Science writer of [employer] Power of Attorney     Does patient want to make changes to medical advance directive? Yes (MAU/Ambulatory/Procedural Areas - Information given) No - Patient declined No - Patient declined No - Patient declined     Would patient like information on creating a medical advance directive?  No - Patient declined No - Patient declined  No - Patient declined No - Patient declined No - Patient declined    Current Medications (verified) Outpatient Encounter Medications as of 12/05/2022  Medication Sig   abemaciclib ([name]) 50 MG tablet Take 1 tablet (50 mg total) by mouth 2 (two) times daily. Swallow tablets whole. Do not chew, crush, or split tablets before swallowing.   Accu-Chek FastClix Lancets MISC USE TO CHECK FASTING BLOOD SUGAR AND BEFORE MEALS AND AGAIN IF PATIENT FEELS BAD (SYMPTOMS OF  HYPO) AS DIRECTED BY YOUR PRESCRIBER   allopurinol (ZYLOPRIM) 100 MG tablet Take 1 tablet (100 mg total) by mouth daily.   amLODipine (NORVASC) 10 MG tablet Take 1 tablet (10 mg total) by mouth daily.   aspirin EC 81 MG tablet Take 81 mg by mouth in the morning. Swallow whole.   atorvastatin (LIPITOR) 80 MG tablet TAKE 1 TABLET EVERY EVENING   Blood Glucose Monitoring Suppl (ACCU-CHEK GUIDE) w/Device KIT 1 each by Does not apply route as directed.   carvedilol (COREG) 25 MG tablet Take 1 tablet (25 mg total) by mouth 2 (two) times daily with a meal.   cholecalciferol (VITAMIN D3) 25 MCG (1000 UNIT) tablet Take 1,000 Units by mouth daily.   ferrous sulfate 325 (65 FE) MG EC tablet Take 1 tablet (325 mg total) by mouth in the morning and at bedtime.   gabapentin (NEURONTIN) 100 MG capsule Take 1 capsule (100 mg total) by mouth 3 (three) times daily.   glucose blood (ACCU-CHEK GUIDE) test strip USE TO CHECK FASTING BLOOD SUGAR AND BEFORE MEALS AND AGAIN IF PATIENT FEELS BAD; SYMPTOMS OF HYPOGLYCEMIA   hydrALAZINE (APRESOLINE) 100 MG tablet Take 1 tablet (100 mg total) by mouth 3 (three) times daily.   letrozole (FEMARA) 2.5 MG tablet Take 1 tablet (2.5 mg total) by mouth daily. (Patient taking differently: Take 2.5 mg by mouth every evening.)   Multiple Vitamin (MULTIVITAMIN WITH MINERALS) TABS tablet Take 1 tablet by mouth  daily.   ondansetron (ZOFRAN) 8 MG tablet TAKE ONE TABLET BY MOUTH EVERY 8 HOURS AS NEEDED FOR NAUSEA/VOMITING   polyethylene glycol (MIRALAX / GLYCOLAX) 17 g packet Take 17 g by mouth daily as needed (constipation.).   prochlorperazine (COMPAZINE) 10 MG tablet Take 1 tablet (10 mg total) by mouth every 6 (six) hours as needed for nausea or vomiting.   spironolactone (ALDACTONE) 25 MG tablet Take 1 tablet (25 mg total) by mouth daily.   No facility-administered encounter medications on file as of 12/05/2022.    Allergies (verified) Sulfa antibiotics   History: Past Medical  History:  Diagnosis Date   Chronic kidney disease, stage 3b (HCC)    Diabetes (HCC)    Hypertension    Pneumonia due to COVID-19 virus 07/25/2020   Primary malignant neoplasm of breast with metastasis (HCC) 09/14/2021   Stroke (cerebrum) (HCC)    Stroke (HCC) 05/15/2018   Past Surgical History:  Procedure Laterality Date   CHEST TUBE INSERTION Left 09/07/2021   Procedure: CHEST TUBE INSERTION;  Surgeon: Lorin Glass, MD;  Location[facility] ENDOSCOPY;  Service: Pulmonary;  Laterality: Left;   CHEST TUBE INSERTION N/A 03/19/2022   Procedure: REMOVAL PLEURAL DRAINAGE CATHETER;  Surgeon: Omar Person, MD;  Location: [facility] ENDOSCOPY;  Service: Pulmonary;  Laterality: N/A;   EYE SURGERY Left 04/06/2020   Implant placed   EYE SURGERY Right 03/30/2020   Implant placed    hysterectomy     LOOP RECORDER INSERTION N/A 05/20/2018   Procedure: LOOP RECORDER INSERTION;  Surgeon: Hillis Range, MD;  Location: [facility] INVASIVE CV LAB;  Service: Cardiovascular;  Laterality: N/A;   TEE WITHOUT CARDIOVERSION N/A 05/20/2018   Procedure: TRANSESOPHAGEAL ECHOCARDIOGRAM (TEE);  Surgeon: Quintella Reichert, MD;  Location: [facility] ENDOSCOPY;  Service: Cardiovascular;  Laterality: N/A;   Family History  Problem Relation Age of Onset   Hypertension Mother    Stroke Mother    Hypertension Father    Social History   Socioeconomic History   Marital status: Divorced    Spouse name: Not on file   Number of children: Not on file   Years of education: Not on file   Highest education level: 12th grade  Occupational History   Not on file  Tobacco Use   Smoking status: Former    Packs/day: 1.00    Years: 15.00    Additional pack years: 0.00    Total pack years: 15.00    Types: Cigarettes    Quit date: 07/30/1985    Years since quitting: 37.3   Smokeless tobacco: Never  Vaping Use   Vaping Use: Never used  Substance and Sexual Activity   Alcohol use: Never   Drug use: Never   Sexual activity: Not Currently  Other  Topics Concern   Not on file  Social History Narrative   Lives in [place]; relies on daughter for transportation; grandson lives with her    Social Determinants of Health   Financial Resource Strain: Low Risk  (12/05/2022)   Overall Financial Resource Strain (CARDIA)    Difficulty of Paying Living Expenses: Not hard at all  Food Insecurity: No Food Insecurity (12/05/2022)   Hunger Vital Sign    Worried About Running Out of Food in the Last Year: Never true    Ran Out of Food in the Last Year: Never true  Transportation Needs: No Transportation Needs (12/05/2022)   PRAPARE - Administrator, Civil Service (Medical): No    Lack of Transportation (  Non-Medical): No  Physical Activity: Inactive (12/05/2022)   Exercise Vital Sign    Days of Exercise per Week: 0 days    Minutes of Exercise per Session: 0 min  Stress: No Stress Concern Present (12/05/2022)   [employer] of Occupational Health - Occupational Stress Questionnaire    Feeling of Stress : Not at all  Social Connections: Moderately Isolated (12/05/2022)   Social Connection and Isolation Panel [NHANES]    Frequency of Communication with Friends and Family: More than three times a week    Frequency of Social Gatherings with Friends and Family: More than three times a week    Attends Religious Services: More than 4 times per year    Active Member of [employer] or Organizations: No    Attends Engineer, structural: Never    Marital Status: Divorced    Tobacco Counseling Counseling given: Not Answered   Clinical Intake:  Pre-visit preparation completed: Yes  Pain : No/denies pain  Diabetes: Yes CBG done?: No Did pt. bring in CBG monitor from home?: No  How often do you need to have someone help you when you read instructions, pamphlets, or other written materials from your doctor or pharmacy?: 1 - Never  Diabetic?Yes   Nutrition Risk Assessment:  Has the patient had any N/V/D within the last 2 months?   No  Does the patient have any non-healing wounds?  No  Has the patient had any unintentional weight loss or weight gain?  No   Diabetes:  Is the patient diabetic?  Yes  If diabetic, was a CBG obtained today?  No  Did the patient bring in their glucometer from home?  No  How often do you monitor your CBG's? As needed.   Financial Strains and Diabetes Management:  Are you having any financial strains with the device, your supplies or your medication? No .  Does the patient want to be seen by Chronic Care Management for management of their diabetes?  No  Would the patient like to be referred to a Nutritionist or for Diabetic Management?  No   Diabetic Exams:  Diabetic Eye Exam: Completed 02/05/22 Diabetic Foot Exam: Completed 03/21/22   Interpreter Needed?: No  Information entered by :: Kandis Fantasia LPN   Activities of Daily Living    12/05/2022    1:39 PM 03/05/2022    2:20 PM  In your present state of health, do you have any difficulty performing the following activities:  Hearing? 0 0  Vision? 0   Difficulty concentrating or making decisions? 1 0  Walking or climbing stairs? 0 1  Comment  uses DME  Dressing or bathing? 0 0  Doing errands, shopping? 1 1  Comment  relies on family-does not drive  Preparing Food and eating ? N Y  Comment  dtr assists  Using the Toilet? N N  In the past six months, have you accidently leaked urine? N N  Do you have problems with loss of bowel control? N N  Managing your Medications? Y Y  Comment  dtr assists  Managing your Finances? N Y  Comment  dtr assists  Housekeeping or managing your Housekeeping? [place]  Comment  dtr assists    Patient Care Team: [name], MD as PCP - General (Family Medicine) Anselm Lis, RPH-CPP as Pharmacist (Hematology and Oncology) Serena Croissant, MD as Medical Oncologist (Hematology and Oncology) Clarene Duke, Karma Lew, RN as [facility] Care Management  Indicate any recent [employer] Rx  you may have received from other than [facility] providers in the past year (date may be approximate).     Assessment:   This is a routine wellness examination for [place].  Hearing/Vision screen Hearing Screening - Comments:: Denies hearing difficulties   Vision Screening - Comments:: Wears rx glasses - up to date with routine eye exams    Dietary issues and exercise activities discussed: Current Exercise Habits: The patient does not participate in regular exercise at present   Goals Addressed             This Visit's Progress    Increase physical activity        Depression Screen    12/05/2022    1:37 PM 08/09/2022   10:30 AM 05/03/2022   11:10 AM 03/21/2022   10:17 AM 03/05/2022    2:11 PM 01/08/2022   10:44 AM 10/04/2021    9:40 AM  PHQ 2/9 Scores  PHQ - 2 Score 0 0 0 2 0 0 0  PHQ- 9 Score  0 0 6  0 0    Fall Risk    12/05/2022    1:39 PM 11/20/2022   11:01 AM 08/09/2022   10:29 AM 05/03/2022   11:10 AM 03/05/2022    2:20 PM  Fall Risk   Falls in the past year? 0 0 0 0 0  Number falls in past yr: 0 0 0 0 0  Injury with Fall? 0 0 0 0 0  Risk for fall due to : No Fall Risks No Fall Risks  No Fall Risks Medication side effect  Follow up Falls prevention discussed;Education provided;Falls evaluation completed    Falls evaluation completed;Education provided    FALL RISK PREVENTION PERTAINING TO THE HOME:  Any stairs in or around the home? No  If so, are there any without handrails? No  Home free of loose throw rugs in walkways, pet beds, electrical cords, etc? Yes  Adequate lighting in your home to reduce risk of falls? Yes   ASSISTIVE DEVICES UTILIZED TO PREVENT FALLS:  Life alert? No  Use of a cane, walker or w/c? No  Grab bars in the bathroom? Yes  Shower chair or bench in shower? No  Elevated toilet seat or a handicapped toilet? Yes   TIMED UP AND GO:  Was the test performed? No . Telephonic visit   Cognitive Function:        12/05/2022    1:40 PM   6CIT Screen  What Year? 0 points  What month? 0 points  What time? 0 points  Count back from 20 0 points  Months in reverse 0 points  Repeat phrase 0 points  Total Score 0 points    Immunizations Immunization History  Administered Date(s) Administered   Fluad Quad(high Dose 65+) 05/03/2022   Influenza, High Dose Seasonal PF 05/16/2018   Influenza,inj,Quad PF,6+ Mos 04/11/2020, 05/22/2021   Janssen (J&J) SARS-COV-2 Vaccination 11/03/2019   Pneumococcal Conjugate-13 05/16/2018   Pneumococcal Polysaccharide-23 04/11/2020   Tdap 05/27/2019    TDAP status: Up to date  Flu Vaccine status: Up to date  Pneumococcal vaccine status: Up to date  Covid-19 vaccine status: Information provided on how to obtain vaccines.   Qualifies for Shingles Vaccine? Yes   Zostavax completed No   Shingrix Completed?: No.    Education has been provided regarding the importance of this vaccine. Patient has been advised to call insurance company to determine out of pocket expense if they have not yet received this  vaccine. Advised may also receive vaccine at local pharmacy or Health Dept. Verbalized acceptance and understanding.  Screening Tests Health Maintenance  Topic Date Due   Zoster Vaccines- Shingrix (1 of 2) Never done   COVID-19 Vaccine (2 - Janssen risk series) 12/01/2019   Diabetic kidney evaluation - Urine ACR  09/28/2022   DEXA SCAN  05/04/2023 (Originally 10/14/2010)   OPHTHALMOLOGY EXAM  02/06/2023   INFLUENZA VACCINE  02/28/2023   FOOT EXAM  03/22/2023   HEMOGLOBIN A1C  05/22/2023   Diabetic kidney evaluation - eGFR measurement  12/04/2023   Medicare Annual Wellness (AWV)  12/05/2023   DTaP/Tdap/Td (2 - Td or Tdap) 05/26/2029   Pneumonia Vaccine 1+ Years old  Completed   Hepatitis C Screening  Completed   HPV VACCINES  Aged Out    Health Maintenance  Health Maintenance Due  Topic Date Due   Zoster Vaccines- Shingrix (1 of 2) Never done   COVID-19 Vaccine (2 - Janssen risk  series) 12/01/2019   Diabetic kidney evaluation - Urine ACR  09/28/2022    Colorectal cancer screening: No longer required.   Mammogram status: No longer required due to age.  Bone Density status: patient declines at this time   Lung Cancer Screening: (Low Dose CT Chest recommended if Age 76-80 years, 30 pack-year currently smoking OR have quit w/in 15years.) does not qualify.   Lung Cancer Screening Referral: n/a  Additional Screening:  Hepatitis C Screening: does qualify; Completed 08/09/22  Vision Screening: Recommended annual ophthalmology exams for early detection of glaucoma and other disorders of the eye. Is the patient up to date with their annual eye exam?  Yes  Who is the provider or what is the name of the office in which the patient attends annual eye exams? Unable to provide name If pt is not established with a provider, would they like to be referred to a provider to establish care? No .   Dental Screening: Recommended annual dental exams for proper oral hygiene  Community Resource Referral / Chronic Care Management: CRR required this visit?  No   CCM required this visit?  No      Plan:     I have personally reviewed and noted the following in the patient's chart:   Medical and social history Use of alcohol, tobacco or illicit drugs  Current medications and supplements including opioid prescriptions. Patient is not currently taking opioid prescriptions. Functional ability and status Nutritional status Physical activity Advanced directives List of other physicians Hospitalizations, surgeries, and ER visits in previous 12 months Vitals Screenings to include cognitive, depression, and falls Referrals and appointments  In addition, I have reviewed and discussed with patient certain preventive protocols, quality metrics, and best practice recommendations. A written personalized care plan for preventive services as well as general preventive health  recommendations were provided to patient.     Durwin Nora, [place]   08/04/1094   Due to this being a virtual visit, the after visit summary with patients personalized plan was offered to patient via mail or my-chart. Patient would like to access on my-chart  Nurse Notes: No concerns

## 2022-12-05 NOTE — Patient Instructions (Signed)
Heather Rogers , Thank you for taking time to come for your Medicare Wellness Visit. I appreciate your ongoing commitment to your health goals. Please review the following plan we discussed and let me know if I can assist you in the future.   These are the goals we discussed:  Goals      Increase physical activity        This is a list of the screening recommended for you and due dates:  Health Maintenance  Topic Date Due   Zoster (Shingles) Vaccine (1 of 2) Never done   COVID-19 Vaccine (2 - Janssen risk series) 12/01/2019   Yearly kidney health urinalysis for diabetes  09/28/2022   DEXA scan (bone density measurement)  05/04/2023*   Eye exam for diabetics  02/06/2023   Flu Shot  02/28/2023   Complete foot exam   03/22/2023   Hemoglobin A1C  05/22/2023   Yearly kidney function blood test for diabetes  12/04/2023   Medicare Annual Wellness Visit  12/05/2023   DTaP/Tdap/Td vaccine (2 - Td or Tdap) 05/26/2029   Pneumonia Vaccine  Completed   Hepatitis C Screening: USPSTF Recommendation to screen - Ages 57-79 yo.  Completed   HPV Vaccine  Aged Out  *Topic was postponed. The date shown is not the original due date.    Advanced directives: Information on Advanced Care Planning can be found at [facility] Advance Health Care Directives Advance Health Care Directives ([email or web address]    Conditions/risks identified: Aim for 30 minutes of exercise or brisk walking, 6-8 glasses of water, and 5 servings of fruits and vegetables each day.  Next appointment: Follow up in one year for your annual wellness visit    Preventive Care 65 Years and Older, Female Preventive care refers to lifestyle choices and visits with your health care provider that can promote health and wellness. What does preventive care include? A yearly physical exam. This is also called an annual well check. Dental exams once or twice a year. Routine eye exams. Ask your health care provider how often you  should have your eyes checked. Personal lifestyle choices, including: Daily care of your teeth and gums. Regular physical activity. Eating a healthy diet. Avoiding tobacco and drug use. Limiting alcohol use. Practicing safe sex. Taking low-dose aspirin every day. Taking vitamin and mineral supplements as recommended by your health care provider. What happens during an annual well check? The services and screenings done by your health care provider during your annual well check will depend on your age, overall health, lifestyle risk factors, and family history of disease. Counseling  Your health care provider may ask you questions about your: Alcohol use. Tobacco use. Drug use. Emotional well-being. Home and relationship well-being. Sexual activity. Eating habits. History of falls. Memory and ability to understand (cognition). Work and work Astronomer. Reproductive health. Screening  You may have the following tests or measurements: Height, weight, and BMI. Blood pressure. Lipid and cholesterol levels. These may be checked every 5 years, or more frequently if you are over 74 years old. Skin check. Lung cancer screening. You may have this screening every year starting at age 63 if you have a 30-pack-year history of smoking and currently smoke or have quit within the past 15 years. Fecal occult blood test (FOBT) of the stool. You may have this test every year starting at age 43. Flexible sigmoidoscopy or colonoscopy. You may have a sigmoidoscopy every 5 years or a colonoscopy every 10 years starting at  age 53. Hepatitis C blood test. Hepatitis B blood test. Sexually transmitted disease (STD) testing. Diabetes screening. This is done by checking your blood sugar (glucose) after you have not eaten for a while (fasting). You may have this done every 1-3 years. Bone density scan. This is done to screen for osteoporosis. You may have this done starting at age 31. Mammogram. This may  be done every 1-2 years. Talk to your health care provider about how often you should have regular mammograms. Talk with your health care provider about your test results, treatment options, and if necessary, the need for more tests. Vaccines  Your health care provider may recommend certain vaccines, such as: Influenza vaccine. This is recommended every year. Tetanus, diphtheria, and acellular pertussis (Tdap, Td) vaccine. You may need a Td booster every 10 years. Zoster vaccine. You may need this after age 90. Pneumococcal 13-valent conjugate (PCV13) vaccine. One dose is recommended after age 30. Pneumococcal polysaccharide (PPSV23) vaccine. One dose is recommended after age 15. Talk to your health care provider about which screenings and vaccines you need and how often you need them. This information is not intended to replace advice given to you by your health care provider. Make sure you discuss any questions you have with your health care provider. Document Released: 08/12/2015 Document Revised: 04/04/2016 Document Reviewed: 05/17/2015 Elsevier Interactive Patient Education  2017 [employer].  Fall Prevention in the Home Falls can cause injuries. They can happen to people of all ages. There are many things you can do to make your home safe and to help prevent falls. What can I do on the outside of my home? Regularly fix the edges of walkways and driveways and fix any cracks. Remove anything that might make you trip as you walk through a door, such as a raised step or threshold. Trim any bushes or trees on the path to your home. Use bright outdoor lighting. Clear any walking paths of anything that might make someone trip, such as rocks or tools. Regularly check to see if handrails are loose or broken. Make sure that both sides of any steps have handrails. Any raised decks and porches should have guardrails on the edges. Have any leaves, snow, or ice cleared regularly. Use sand or salt  on walking paths during winter. Clean up any spills in your garage right away. This includes oil or grease spills. What can I do in the bathroom? Use night lights. Install grab bars by the toilet and in the tub and shower. Do not use towel bars as grab bars. Use non-skid mats or decals in the tub or shower. If you need to sit down in the shower, use a plastic, non-slip stool. Keep the floor dry. Clean up any water that spills on the floor as soon as it happens. Remove soap buildup in the tub or shower regularly. Attach bath mats securely with double-sided non-slip rug tape. Do not have throw rugs and other things on the floor that can make you trip. What can I do in the bedroom? Use night lights. Make sure that you have a light by your bed that is easy to reach. Do not use any sheets or blankets that are too big for your bed. They should not hang down onto the floor. Have a firm chair that has side arms. You can use this for support while you get dressed. Do not have throw rugs and other things on the floor that can make you trip. What can I  do in the kitchen? Clean up any spills right away. Avoid walking on wet floors. Keep items that you use a lot in easy-to-reach places. If you need to reach something above you, use a strong step stool that has a grab bar. Keep electrical cords out of the way. Do not use floor polish or wax that makes floors slippery. If you must use wax, use non-skid floor wax. Do not have throw rugs and other things on the floor that can make you trip. What can I do with my stairs? Do not leave any items on the stairs. Make sure that there are handrails on both sides of the stairs and use them. Fix handrails that are broken or loose. Make sure that handrails are as long as the stairways. Check any carpeting to make sure that it is firmly attached to the stairs. Fix any carpet that is loose or worn. Avoid having throw rugs at the top or bottom of the stairs. If you  do have throw rugs, attach them to the floor with carpet tape. Make sure that you have a light switch at the top of the stairs and the bottom of the stairs. If you do not have them, ask someone to add them for you. What else can I do to help prevent falls? Wear shoes that: Do not have high heels. Have rubber bottoms. Are comfortable and fit you well. Are closed at the toe. Do not wear sandals. If you use a stepladder: Make sure that it is fully opened. Do not climb a closed stepladder. Make sure that both sides of the stepladder are locked into place. Ask someone to hold it for you, if possible. Clearly mark and make sure that you can see: Any grab bars or handrails. First and last steps. Where the edge of each step is. Use tools that help you move around (mobility aids) if they are needed. These include: Canes. Walkers. Scooters. Crutches. Turn on the lights when you go into a dark area. Replace any light bulbs as soon as they burn out. Set up your furniture so you have a clear path. Avoid moving your furniture around. If any of your floors are uneven, fix them. If there are any pets around you, be aware of where they are. Review your medicines with your doctor. Some medicines can make you feel dizzy. This can increase your chance of falling. Ask your doctor what other things that you can do to help prevent falls. This information is not intended to replace advice given to you by your health care provider. Make sure you discuss any questions you have with your health care provider. Document Released: 05/12/2009 Document Revised: 12/22/2015 Document Reviewed: 08/20/2014 Elsevier Interactive Patient Education  2017 [employer].

## 2023-01-10 ENCOUNTER — Other Ambulatory Visit: Payer: Self-pay | Admitting: Hematology and Oncology

## 2023-01-16 ENCOUNTER — Telehealth: Payer: Self-pay | Admitting: Pharmacist

## 2023-01-16 ENCOUNTER — Other Ambulatory Visit (HOSPITAL_BASED_OUTPATIENT_CLINIC_OR_DEPARTMENT_OTHER): Payer: Medicare HMO | Admitting: Pharmacist

## 2023-01-16 DIAGNOSIS — Z79899 Other long term (current) drug therapy: Secondary | ICD-10-CM

## 2023-01-16 NOTE — Telephone Encounter (Signed)
Opened in error

## 2023-01-16 NOTE — Progress Notes (Signed)
   01/16/2023 Name: DAYLYNN BERGSTRAND MRN: [id number] DOB: [birth year]  No chief complaint on file.   Heather Rogers is a 77 y.o. year old female who was referred to the pharmacist by a quality report for assistance in managing medication access. I attempted contact her regarding her [facility] measure. Per a [facility] report, she failed this measure in 2023. However, she is no longer on ACE/ARB therapy and her fills of amlodipine, carvedilol, hydralazine, and spironolactone are up to date.  Butch Penny, PharmD, Patsy [place], CPP Clinical Pharmacist [facility] [phone]

## 2023-01-21 ENCOUNTER — Telehealth: Payer: Medicare HMO | Admitting: Physician Assistant

## 2023-01-21 ENCOUNTER — Encounter: Payer: Self-pay | Admitting: *Deleted

## 2023-01-21 ENCOUNTER — Encounter: Payer: Self-pay | Admitting: Hematology and Oncology

## 2023-01-21 DIAGNOSIS — B029 Zoster without complications: Secondary | ICD-10-CM | POA: Diagnosis not present

## 2023-01-21 MED ORDER — VALACYCLOVIR HCL 1 G PO TABS
1000.0000 mg | ORAL_TABLET | Freq: Three times a day (TID) | ORAL | 0 refills | Status: AC
Start: 2023-01-21 — End: 2023-01-28

## 2023-01-21 NOTE — Progress Notes (Signed)
Received mychart message from pt stating she was recently dx with shingles and is on Valacyclovir 1 g TID x 7 days and requesting advice on continuing [name].  Per oral oncology pharmacist Jonny Ruiz, pt to hold [name] while on Valacyclovir and resume once symptoms have resolved.  RN sent message to scheduling to schedule pt MD f/u in 2 weeks. Pt educated and verbalized understanding.

## 2023-01-21 NOTE — Progress Notes (Signed)
E-visit for Shingles   We are sorry that you are not feeling well. Here is how we plan to help!  Based on what you shared with me it looks like you have shingles.  Shingles or herpes zoster, is a common infection of the nerves.  It is a painful rash caused by the herpes zoster virus.  This is the same virus that causes chickenpox.  After a person has chickenpox, the virus remains inactive in the nerve cells.  Years later, the virus can become active again and travel to the skin.  It typically will appear on one side of the face or body.  Burning or shooting pain, tingling, or itching are early signs of the infection.  Blisters typically scab over in 7 to 10 days and clear up within 2-4 weeks. Shingles is only contagious to people that have never had the chickenpox, the chickenpox vaccine, or anyone who has a compromised immune system.  You should avoid contact with these type of people until your blisters scab over.  I have prescribed Valacyclovir 1g three times daily for 7 days    HOME CARE: Apply ice packs (wrapped in a thin towel), cool compresses, or soak in cool bath to help reduce pain. Use calamine lotion to calm itchy skin. Avoid scratching the rash. Avoid direct sunlight.  GET HELP RIGHT AWAY IF: Symptoms that don't away after treatment. A rash or blisters near your eye. Increased drainage, fever, or rash after treatment. Severe pain that doesn't go away.   MAKE SURE YOU   Understand these instructions. Will watch your condition. Will get help right away if you are not doing well or get worse.  Thank you for choosing an e-visit.  Your e-visit answers were reviewed by a board certified advanced clinical practitioner to complete your personal care plan. Depending upon the condition, your plan could have included both over the counter or prescription medications.  Please review your pharmacy choice. Make sure the pharmacy is open so you can pick up prescription now. If there  is a problem, you may contact your provider through MyChart messaging and have the prescription routed to another pharmacy.  Your safety is important to us. If you have drug allergies check your prescription carefully.   For the next 24 hours you can use MyChart to ask questions about today's visit, request a non-urgent call back, or ask for a work or school excuse. You will get an email in the next two days asking about your experience. I hope that your e-visit has been valuable and will speed your recovery.   I have spent 5 minutes in review of e-visit questionnaire, review and updating patient chart, medical decision making and response to patient.   Ranita Stjulien M Bassam Dresch, PA-C  

## 2023-01-23 ENCOUNTER — Telehealth: Payer: Self-pay | Admitting: Hematology and Oncology

## 2023-01-23 NOTE — Telephone Encounter (Signed)
Scheduled appointments per staff message. Talked with the patients daughter and she is aware of the made appointments for the patient.

## 2023-01-27 NOTE — Progress Notes (Signed)
Patient Care Team: [name], MD as PCP - General (Family Medicine) Anselm Lis, RPH-CPP as Pharmacist (Hematology and Oncology) Serena Croissant, MD as Medical Oncologist (Hematology and Oncology) Riley Churches, RN as [facility] Care Management Meier, Ivor Costa, MD as Consulting Physician (Pulmonary Disease)  DIAGNOSIS:  Encounter Diagnosis  Name Primary?   Primary malignant neoplasm of breast with metastasis (HCC) Yes    SUMMARY OF ONCOLOGIC HISTORY: Oncology History  Breast mass, left  09/04/2021 Initial Diagnosis   Breast mass, left   Primary malignant neoplasm of breast with metastasis (HCC)  09/14/2021 Initial Diagnosis   Large neglected left breast cancer for several years.  Hospitalization 09/02/2021-09/08/2021: Shortness of breath, large pleural effusion, thoracentesis, pleural fluid cytology: Metastatic adenocarcinoma breast origin ER/PR positive HER2 negative     CHIEF COMPLIANT: Follow-up metastatic breast cancer on [name] and letrozole   INTERVAL HISTORY: Heather Rogers is a 77 y.o. with above-mentioned history of left breast mass, bone lesions, pulmonary nodules, pleural effusion. She presents to the clinic today for follow-up. Pt reports things are going good. She denies any pain or discomfort in breast. Denies any fatigue or diarrhea. She is tolerating the letrozole extremely well. She did have the shingles they have subsided.   ALLERGIES:  is allergic to sulfa antibiotics.  MEDICATIONS:  Current Outpatient Medications  Medication Sig Dispense Refill   abemaciclib ([name]) 50 MG tablet Take 1 tablet (50 mg total) by mouth 2 (two) times daily. Swallow tablets whole. Do not chew, crush, or split tablets before swallowing. 56 tablet 6   Accu-Chek FastClix Lancets MISC USE TO CHECK FASTING BLOOD SUGAR AND BEFORE MEALS AND AGAIN IF PATIENT FEELS BAD (SYMPTOMS OF HYPO) AS DIRECTED BY YOUR PRESCRIBER 102 each 11   allopurinol (ZYLOPRIM)  100 MG tablet Take 1 tablet (100 mg total) by mouth daily. 90 tablet 1   amLODipine (NORVASC) 10 MG tablet Take 1 tablet (10 mg total) by mouth daily. 90 tablet 1   aspirin EC 81 MG tablet Take 81 mg by mouth in the morning. Swallow whole.     atorvastatin (LIPITOR) 80 MG tablet TAKE 1 TABLET EVERY EVENING 90 tablet 0   Blood Glucose Monitoring Suppl (ACCU-CHEK GUIDE) w/Device KIT 1 each by Does not apply route as directed. 1 kit 0   carvedilol (COREG) 25 MG tablet Take 1 tablet (25 mg total) by mouth 2 (two) times daily with a meal. 180 tablet 1   cholecalciferol (VITAMIN D3) 25 MCG (1000 UNIT) tablet Take 1,000 Units by mouth daily.     ferrous sulfate 325 (65 FE) MG EC tablet Take 1 tablet (325 mg total) by mouth in the morning and at bedtime. 60 tablet 1   gabapentin (NEURONTIN) 100 MG capsule Take 1 capsule (100 mg total) by mouth 3 (three) times daily. 270 capsule 1   glucose blood (ACCU-CHEK GUIDE) test strip USE TO CHECK FASTING BLOOD SUGAR AND BEFORE MEALS AND AGAIN IF PATIENT FEELS BAD; SYMPTOMS OF HYPOGLYCEMIA 100 strip 11   hydrALAZINE (APRESOLINE) 100 MG tablet Take 1 tablet (100 mg total) by mouth 3 (three) times daily. 270 tablet 1   letrozole (FEMARA) 2.5 MG tablet TAKE 1 TABLET BY MOUTH DAILY 90 tablet 3   Multiple Vitamin (MULTIVITAMIN WITH MINERALS) TABS tablet Take 1 tablet by mouth daily. 30 tablet 0   ondansetron (ZOFRAN) 8 MG tablet TAKE ONE TABLET BY MOUTH EVERY 8 HOURS AS NEEDED FOR NAUSEA/VOMITING 30 tablet 2   polyethylene glycol (  MIRALAX / GLYCOLAX) 17 g packet Take 17 g by mouth daily as needed (constipation.).     prochlorperazine (COMPAZINE) 10 MG tablet Take 1 tablet (10 mg total) by mouth every 6 (six) hours as needed for nausea or vomiting. 30 tablet 2   spironolactone (ALDACTONE) 25 MG tablet Take 1 tablet (25 mg total) by mouth daily. 90 tablet 1   No current facility-administered medications for this visit.    PHYSICAL EXAMINATION: ECOG PERFORMANCE STATUS: 1  - Symptomatic but completely ambulatory  Vitals:   02/04/23 1530  BP: (!) 165/65  Pulse: 82  Resp: 18  Temp: 97.8 F (36.6 C)  SpO2: 100%   Filed Weights   02/04/23 1530  Weight: 139 lb 11.2 oz (63.4 kg)      LABORATORY DATA:  I have reviewed the data as listed    Latest Ref Rng & Units 02/04/2023    2:44 PM 12/04/2022   10:50 AM 09/10/2022    9:25 AM  CMP  Glucose 70 - 99 mg/dL 93  87  213   BUN 8 - 23 mg/dL 38  30  28   Creatinine 0.44 - 1.00 mg/dL 0.86  5.78  4.69   Sodium 135 - 145 mmol/L 134  135  137   Potassium 3.5 - 5.1 mmol/L 4.7  5.3  4.6   Chloride 98 - 111 mmol/L 101  104  106   CO2 22 - 32 mmol/L 24  25  25    Calcium 8.9 - 10.3 mg/dL 62.9  9.6  9.4   Total Protein 6.5 - 8.1 g/dL 8.1  7.8  6.8   Total Bilirubin 0.3 - 1.2 mg/dL 0.3  0.4  0.3   Alkaline Phos 38 - 126 U/L 81  70  66   AST 15 - 41 U/L 18  22  17    ALT 0 - 44 U/L 14  20  17      Lab Results  Component Value Date   WBC 6.5 02/04/2023   HGB 9.7 (L) 02/04/2023   HCT 29.8 (L) 02/04/2023   MCV 81.2 02/04/2023   PLT 277 02/04/2023   NEUTROABS 4.6 02/04/2023    ASSESSMENT & PLAN:  Primary malignant neoplasm of breast with metastasis (HCC) 09/14/2021:Large neglected left breast cancer for several years.  Hospitalization 09/02/2021-09/08/2021: Shortness of breath, large pleural effusion, thoracentesis, pleural fluid cytology: Metastatic adenocarcinoma breast origin ER 40%, PR 30%, Ki-67 5%, HER2 2+ by IHC, FISH negative ratio 1.43    Current treatment: [name] with letrozole (now reducing to 50 mg p.o. twice daily on 03/15/2022)   Toxicities: Constipation alternating with diarrhea. 2. fatigue has been manageable 3.  Diminished appetite 4.  Shingles: Received Valtrex 01/21/2023     CT chest 11/30/2022: Interval decrease in size of left axillary and left internal mammary chain lymph nodes.  Left upper lobe subpleural nodule minimally decreased in size, scattered tiny left-sided Peri- fissural and  subpleural nodules are similar.  Decrease in the left pleural effusion.  Metastases to the sternum as before  Next scan will be done in 6 months I will see her back in November after the scans were done.  No orders of the defined types were placed in this encounter.  The patient has a good understanding of the overall plan. she agrees with it. she will call with any problems that may develop before the next visit here. Total time spent: 30 mins including face to face time and time spent for planning, charting and  co-ordination of care   Tamsen Meek, MD 02/04/23    I Janan Ridge am acting as a Neurosurgeon for [employer]  I have reviewed the above documentation for accuracy and completeness, and I agree with the above.

## 2023-02-01 ENCOUNTER — Other Ambulatory Visit: Payer: Self-pay | Admitting: *Deleted

## 2023-02-01 DIAGNOSIS — J910 Malignant pleural effusion: Secondary | ICD-10-CM

## 2023-02-01 DIAGNOSIS — N6320 Unspecified lump in the left breast, unspecified quadrant: Secondary | ICD-10-CM

## 2023-02-04 ENCOUNTER — Inpatient Hospital Stay (HOSPITAL_BASED_OUTPATIENT_CLINIC_OR_DEPARTMENT_OTHER): Payer: Medicare HMO | Admitting: Hematology and Oncology

## 2023-02-04 ENCOUNTER — Inpatient Hospital Stay: Payer: Medicare HMO | Attending: Hematology and Oncology

## 2023-02-04 ENCOUNTER — Other Ambulatory Visit: Payer: Self-pay

## 2023-02-04 VITALS — BP 165/65 | HR 82 | Temp 97.8°F | Resp 18 | Ht 62.0 in | Wt 139.7 lb

## 2023-02-04 DIAGNOSIS — R630 Anorexia: Secondary | ICD-10-CM | POA: Insufficient documentation

## 2023-02-04 DIAGNOSIS — R5383 Other fatigue: Secondary | ICD-10-CM | POA: Diagnosis not present

## 2023-02-04 DIAGNOSIS — C50912 Malignant neoplasm of unspecified site of left female breast: Secondary | ICD-10-CM | POA: Insufficient documentation

## 2023-02-04 DIAGNOSIS — Z79811 Long term (current) use of aromatase inhibitors: Secondary | ICD-10-CM | POA: Insufficient documentation

## 2023-02-04 DIAGNOSIS — Z79899 Other long term (current) drug therapy: Secondary | ICD-10-CM | POA: Insufficient documentation

## 2023-02-04 DIAGNOSIS — J910 Malignant pleural effusion: Secondary | ICD-10-CM

## 2023-02-04 DIAGNOSIS — B029 Zoster without complications: Secondary | ICD-10-CM | POA: Diagnosis not present

## 2023-02-04 DIAGNOSIS — C50919 Malignant neoplasm of unspecified site of unspecified female breast: Secondary | ICD-10-CM | POA: Diagnosis not present

## 2023-02-04 DIAGNOSIS — K5900 Constipation, unspecified: Secondary | ICD-10-CM | POA: Insufficient documentation

## 2023-02-04 DIAGNOSIS — Z170 Estrogen receptor positive status [ER+]: Secondary | ICD-10-CM | POA: Diagnosis not present

## 2023-02-04 DIAGNOSIS — N6320 Unspecified lump in the left breast, unspecified quadrant: Secondary | ICD-10-CM

## 2023-02-04 LAB — CBC WITH DIFFERENTIAL (CANCER CENTER ONLY)
Abs Immature Granulocytes: 0.02 10*3/uL (ref 0.00–0.07)
Basophils Absolute: 0 10*3/uL (ref 0.0–0.1)
Basophils Relative: 1 %
Eosinophils Absolute: 0.1 10*3/uL (ref 0.0–0.5)
Eosinophils Relative: 2 %
HCT: 29.8 % — ABNORMAL LOW (ref 36.0–46.0)
Hemoglobin: 9.7 g/dL — ABNORMAL LOW (ref 12.0–15.0)
Immature Granulocytes: 0 %
Lymphocytes Relative: 19 %
Lymphs Abs: 1.2 10*3/uL (ref 0.7–4.0)
MCH: 26.4 pg (ref 26.0–34.0)
MCHC: 32.6 g/dL (ref 30.0–36.0)
MCV: 81.2 fL (ref 80.0–100.0)
Monocytes Absolute: 0.5 10*3/uL (ref 0.1–1.0)
Monocytes Relative: 8 %
Neutro Abs: 4.6 10*3/uL (ref 1.7–7.7)
Neutrophils Relative %: 70 %
Platelet Count: 277 10*3/uL (ref 150–400)
RBC: 3.67 MIL/uL — ABNORMAL LOW (ref 3.87–5.11)
RDW: 15.1 % (ref 11.5–15.5)
WBC Count: 6.5 10*3/uL (ref 4.0–10.5)
nRBC: 0 % (ref 0.0–0.2)

## 2023-02-04 LAB — CMP (CANCER CENTER ONLY)
ALT: 14 U/L (ref 0–44)
AST: 18 U/L (ref 15–41)
Albumin: 4.1 g/dL (ref 3.5–5.0)
Alkaline Phosphatase: 81 U/L (ref 38–126)
Anion gap: 9 (ref 5–15)
BUN: 38 mg/dL — ABNORMAL HIGH (ref 8–23)
CO2: 24 mmol/L (ref 22–32)
Calcium: 10.4 mg/dL — ABNORMAL HIGH (ref 8.9–10.3)
Chloride: 101 mmol/L (ref 98–111)
Creatinine: 1.81 mg/dL — ABNORMAL HIGH (ref 0.44–1.00)
GFR, Estimated: 28 mL/min — ABNORMAL LOW (ref >60)
Glucose, Bld: 93 mg/dL (ref 70–99)
Potassium: 4.7 mmol/L (ref 3.5–5.1)
Sodium: 134 mmol/L — ABNORMAL LOW (ref 135–145)
Total Bilirubin: 0.3 mg/dL (ref 0.3–1.2)
Total Protein: 8.1 g/dL (ref 6.5–8.1)

## 2023-02-04 NOTE — Assessment & Plan Note (Signed)
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  Hospitalization 09/02/2021-09/08/2021: Shortness of breath, large pleural effusion, thoracentesis, pleural fluid cytology: Metastatic adenocarcinoma breast origin ER 40%, PR 30%, Ki-67 5%, HER2 2+ by IHC, FISH negative ratio 1.43    Current treatment: [name] with letrozole (now reducing to 50 mg p.o. twice daily on 03/15/2022)   Toxicities: Constipation alternating with diarrhea. 2. fatigue has been manageable 3.  Diminished appetite 4.  Shingles: Received Valtrex 01/21/2023     CT chest 11/30/2022: Interval decrease in size of left axillary and left internal mammary chain lymph nodes.  Left upper lobe subpleural nodule minimally decreased in size, scattered tiny left-sided Peri- fissural and subpleural nodules are similar.  Decrease in the left pleural effusion.  Metastases to the sternum as before

## 2023-02-05 ENCOUNTER — Telehealth: Payer: Self-pay | Admitting: Hematology and Oncology

## 2023-02-05 NOTE — Telephone Encounter (Signed)
Scheduled appointment per 7/8 los. Left a voicemail for the patients daughter [name] in reference to the patients upcoming appointment.

## 2023-02-07 ENCOUNTER — Other Ambulatory Visit: Payer: Self-pay | Admitting: Family Medicine

## 2023-02-07 DIAGNOSIS — Z8673 Personal history of transient ischemic attack (TIA), and cerebral infarction without residual deficits: Secondary | ICD-10-CM

## 2023-02-07 DIAGNOSIS — I672 Cerebral atherosclerosis: Secondary | ICD-10-CM

## 2023-02-12 ENCOUNTER — Other Ambulatory Visit: Payer: Self-pay | Admitting: Hematology and Oncology

## 2023-02-19 ENCOUNTER — Ambulatory Visit: Payer: Medicare HMO | Attending: Family Medicine | Admitting: Family Medicine

## 2023-02-19 ENCOUNTER — Encounter: Payer: Self-pay | Admitting: Family Medicine

## 2023-02-19 VITALS — BP 146/64 | HR 76 | Temp 97.9°F | Ht 62.0 in | Wt 136.2 lb

## 2023-02-19 DIAGNOSIS — E1159 Type 2 diabetes mellitus with other circulatory complications: Secondary | ICD-10-CM | POA: Diagnosis not present

## 2023-02-19 DIAGNOSIS — C50919 Malignant neoplasm of unspecified site of unspecified female breast: Secondary | ICD-10-CM

## 2023-02-19 DIAGNOSIS — Z7984 Long term (current) use of oral hypoglycemic drugs: Secondary | ICD-10-CM | POA: Diagnosis not present

## 2023-02-19 DIAGNOSIS — E041 Nontoxic single thyroid nodule: Secondary | ICD-10-CM

## 2023-02-19 DIAGNOSIS — I152 Hypertension secondary to endocrine disorders: Secondary | ICD-10-CM

## 2023-02-19 DIAGNOSIS — E1141 Type 2 diabetes mellitus with diabetic mononeuropathy: Secondary | ICD-10-CM

## 2023-02-19 LAB — POCT GLYCOSYLATED HEMOGLOBIN (HGB A1C): HbA1c, POC (controlled diabetic range): 6 % (ref 0.0–7.0)

## 2023-02-19 MED ORDER — GABAPENTIN 100 MG PO CAPS
100.0000 mg | ORAL_CAPSULE | Freq: Three times a day (TID) | ORAL | 1 refills | Status: DC
Start: 2023-02-19 — End: 2023-11-21

## 2023-02-19 MED ORDER — VALSARTAN 160 MG PO TABS: 160.0000 mg | ORAL_TABLET | Freq: Every day | ORAL | 1 refills | Status: DC

## 2023-02-19 MED ORDER — ALLOPURINOL 100 MG PO TABS: 100.0000 mg | ORAL_TABLET | Freq: Every day | ORAL | 1 refills | Status: DC

## 2023-02-19 MED ORDER — AMLODIPINE BESYLATE 10 MG PO TABS: 10.0000 mg | ORAL_TABLET | Freq: Every day | ORAL | 1 refills | Status: DC

## 2023-02-19 MED ORDER — HYDRALAZINE HCL 100 MG PO TABS
100.0000 mg | ORAL_TABLET | Freq: Three times a day (TID) | ORAL | 1 refills | Status: DC
Start: 2023-02-19 — End: 2023-05-22

## 2023-02-19 MED ORDER — CARVEDILOL 25 MG PO TABS: 25.0000 mg | ORAL_TABLET | Freq: Two times a day (BID) | ORAL | 1 refills | Status: DC

## 2023-02-19 NOTE — Progress Notes (Signed)
Subjective:  Patient ID: Heather Rogers, female    DOB: Dec 22, 1945  Age: 77 y.o. MRN: [id number]  CC: Diabetes   HPI Heather Rogers is a 77 y.o. year old female with a history of type 2 diabetes mellitus (A1c 6.0), hypertension, gout, history of CVA, status post loop recorder placement in 2019, metastatic L breast cancer (pulmonary and bony metastasis), thyroid nodule. She is accompanied by her daughter, Heather Rogers to today's visit.  Interval History: Discussed the use of AI scribe software for clinical note transcription with the patient, who gave verbal consent to proceed.  She presents for a routine follow-up. The caregiver reports concerns about elevated fasting blood sugars since discontinuation of metformin, with readings ranging from 103 to 133.I had discontinued Metformin at her last visit due to A1c of 5.6 which today is 6.0.   The patient's oncologist recently recommended discontinuation of spironolactone due to concerns about dehydration but she never discontinued it. The patient's blood pressure readings have been inconsistent across different settings, with readings ranging from 130/54 to 165/80.   The patient's caregiver reports that the patient has been adhering to her medication regimen and has been encouraged to increase water intake. The patient has not been engaging in regular physical activity.       Last visit to oncology was 2 weeks ago and notes reviewed with plans for neck CT scan in 6 months. CT scan from 11/2022 revealed: IMPRESSION: 1. Interval decrease in size of the left axillary and left internal mammary chain lymph nodes. No new or progressive lymphadenopathy in the chest. 2. The left upper lobe subpleural nodule along the mediastinum is stable to minimally decreased in size in the interval. 3. Several additional scattered tiny left-sided perifissural and subpleural nodules are similar. 4. Interval decrease in tiny left pleural effusion. 5. Similar  appearance of left breast lesion and/or surgical scarring. 6. Metastatic disease in the sternum as before. 7.  Aortic Atherosclerosis (ICD10-I70.0).   Past Medical History:  Diagnosis Date   Chronic kidney disease, stage 3b (HCC)    Diabetes (HCC)    Hypertension    Pneumonia due to COVID-19 virus 07/25/2020   Primary malignant neoplasm of breast with metastasis (HCC) 09/14/2021   Stroke (cerebrum) (HCC)    Stroke (HCC) 05/15/2018    Past Surgical History:  Procedure Laterality Date   CHEST TUBE INSERTION Left 09/07/2021   Procedure: CHEST TUBE INSERTION;  Surgeon: Lorin Glass, MD;  Location: [facility] ENDOSCOPY;  Service: Pulmonary;  Laterality: Left;   CHEST TUBE INSERTION N/A 03/19/2022   Procedure: REMOVAL PLEURAL DRAINAGE CATHETER;  Surgeon: Omar Person, MD;  Location: [facility] ENDOSCOPY;  Service: Pulmonary;  Laterality: N/A;   EYE SURGERY Left 04/06/2020   Implant placed   EYE SURGERY Right 03/30/2020   Implant placed    hysterectomy     LOOP RECORDER INSERTION N/A 05/20/2018   Procedure: LOOP RECORDER INSERTION;  Surgeon: Hillis Range, MD;  Location: [facility] INVASIVE CV LAB;  Service: Cardiovascular;  Laterality: N/A;   TEE WITHOUT CARDIOVERSION N/A 05/20/2018   Procedure: TRANSESOPHAGEAL ECHOCARDIOGRAM (TEE);  Surgeon: Quintella Reichert, MD;  Location: [facility] ENDOSCOPY;  Service: Cardiovascular;  Laterality: N/A;    Family History  Problem Relation Age of Onset   Hypertension Mother    Stroke Mother    Hypertension Father     Social History   Socioeconomic History   Marital status: Divorced    Spouse name: Not on file   Number of children: Not  on file   Years of education: Not on file   Highest education level: 12th grade  Occupational History   Not on file  Tobacco Use   Smoking status: Former    Current packs/day: 0.00    Average packs/day: 1 pack/day for 15.0 years (15.0 ttl pk-yrs)    Types: Cigarettes    Start date: 07/30/1970    Quit date: 07/30/1985    Years  since quitting: 37.5   Smokeless tobacco: Never  Vaping Use   Vaping status: Never Used  Substance and Sexual Activity   Alcohol use: Never   Drug use: Never   Sexual activity: Not Currently  Other Topics Concern   Not on file  Social History Narrative   Lives in [place]; relies on daughter for transportation; grandson lives with her    Social Determinants of Health   Financial Resource Strain: Low Risk  (12/05/2022)   Overall Financial Resource Strain (CARDIA)    Difficulty of Paying Living Expenses: Not hard at all  Food Insecurity: No Food Insecurity (12/05/2022)   Hunger Vital Sign    Worried About Running Out of Food in the Last Year: Never true    Ran Out of Food in the Last Year: Never true  Transportation Needs: No Transportation Needs (12/05/2022)   PRAPARE - Administrator, Civil Service (Medical): No    Lack of Transportation (Non-Medical): No  Physical Activity: Inactive (12/05/2022)   Exercise Vital Sign    Days of Exercise per Week: 0 days    Minutes of Exercise per Session: 0 min  Stress: No Stress Concern Present (12/05/2022)   [employer] of Occupational Health - Occupational Stress Questionnaire    Feeling of Stress : Not at all  Social Connections: Moderately Isolated (12/05/2022)   Social Connection and Isolation Panel [NHANES]    Frequency of Communication with Friends and Family: More than three times a week    Frequency of Social Gatherings with Friends and Family: More than three times a week    Attends Religious Services: More than 4 times per year    Active Member of [employer] or Organizations: No    Attends Banker Meetings: Never    Marital Status: Divorced    Allergies  Allergen Reactions   Sulfa Antibiotics Itching    Outpatient Medications Prior to Visit  Medication Sig Dispense Refill   Accu-Chek FastClix Lancets MISC USE TO CHECK FASTING BLOOD SUGAR AND BEFORE MEALS AND AGAIN IF PATIENT FEELS BAD (SYMPTOMS OF HYPO) AS  DIRECTED BY YOUR PRESCRIBER 102 each 11   aspirin EC 81 MG tablet Take 81 mg by mouth in the morning. Swallow whole.     atorvastatin (LIPITOR) 80 MG tablet TAKE 1 TABLET EVERY EVENING 90 tablet 0   Blood Glucose Monitoring Suppl (ACCU-CHEK GUIDE) w/Device KIT 1 each by Does not apply route as directed. 1 kit 0   cholecalciferol (VITAMIN D3) 25 MCG (1000 UNIT) tablet Take 1,000 Units by mouth daily.     ferrous sulfate 325 (65 FE) MG EC tablet Take 1 tablet (325 mg total) by mouth in the morning and at bedtime. 60 tablet 1   glucose blood (ACCU-CHEK GUIDE) test strip USE TO CHECK FASTING BLOOD SUGAR AND BEFORE MEALS AND AGAIN IF PATIENT FEELS BAD; SYMPTOMS OF HYPOGLYCEMIA 100 strip 11   letrozole (FEMARA) 2.5 MG tablet TAKE 1 TABLET BY MOUTH DAILY 90 tablet 3   Multiple Vitamin (MULTIVITAMIN WITH MINERALS) TABS tablet Take 1 tablet  by mouth daily. 30 tablet 0   ondansetron (ZOFRAN) 8 MG tablet TAKE ONE TABLET BY MOUTH EVERY 8 HOURS AS NEEDED FOR NAUSEA/VOMITING 30 tablet 2   polyethylene glycol (MIRALAX / GLYCOLAX) 17 g packet Take 17 g by mouth daily as needed (constipation.).     prochlorperazine (COMPAZINE) 10 MG tablet Take 1 tablet (10 mg total) by mouth every 6 (six) hours as needed for nausea or vomiting. 30 tablet 2   [name] 50 MG tablet TAKE 1 TABLET BY MOUTH TWICE DAILY. SWALLOW TABLETS WHOLE. DO NOT CHEW, CRUSH OR SPLIT TABLETS BEFORE SWALLOWING. 56 tablet 0   allopurinol (ZYLOPRIM) 100 MG tablet Take 1 tablet (100 mg total) by mouth daily. 90 tablet 1   amLODipine (NORVASC) 10 MG tablet Take 1 tablet (10 mg total) by mouth daily. 90 tablet 1   carvedilol (COREG) 25 MG tablet Take 1 tablet (25 mg total) by mouth 2 (two) times daily with a meal. 180 tablet 1   gabapentin (NEURONTIN) 100 MG capsule Take 1 capsule (100 mg total) by mouth 3 (three) times daily. 270 capsule 1   hydrALAZINE (APRESOLINE) 100 MG tablet Take 1 tablet (100 mg total) by mouth 3 (three) times daily. 270 tablet 1    spironolactone (ALDACTONE) 25 MG tablet Take 1 tablet (25 mg total) by mouth daily. 90 tablet 1   No facility-administered medications prior to visit.     ROS Review of Systems  Constitutional:  Negative for activity change and appetite change.  HENT:  Negative for sinus pressure and sore throat.   Respiratory:  Negative for chest tightness, shortness of breath and wheezing.   Cardiovascular:  Negative for chest pain and palpitations.  Gastrointestinal:  Negative for abdominal distention, abdominal pain and constipation.  Genitourinary: Negative.   Musculoskeletal: Negative.   Psychiatric/Behavioral:  Negative for behavioral problems and dysphoric mood.     Objective:  BP (!) 146/64   Pulse 76   Temp 97.9 F (36.6 C) (Oral)   Ht 5\' 2"  (1.575 m)   Wt 136 lb 3.2 oz (61.8 kg)   SpO2 98%   BMI 24.91 kg/m      02/19/2023    2:09 PM 02/19/2023    1:41 PM 02/04/2023    3:30 PM  BP/Weight  Systolic BP 146 165 165  Diastolic BP 64 63 65  Wt. (Lbs)  136.2 139.7  BMI  24.91 kg/m2 25.55 kg/m2      Physical Exam Constitutional:      Appearance: She is well-developed.  Cardiovascular:     Rate and Rhythm: Normal rate.     Heart sounds: Normal heart sounds. No murmur heard. Pulmonary:     Effort: Pulmonary effort is normal.     Breath sounds: Normal breath sounds. No wheezing or rales.  Chest:     Chest wall: No tenderness.  Abdominal:     General: Bowel sounds are normal. There is no distension.     Palpations: Abdomen is soft. There is no mass.     Tenderness: There is no abdominal tenderness.  Musculoskeletal:        General: Normal range of motion.     Right lower leg: No edema.     Left lower leg: No edema.  Neurological:     Mental Status: She is alert and oriented to person, place, and time.  Psychiatric:        Mood and Affect: Mood normal.        Latest Ref Rng &  Units 02/04/2023    2:44 PM 12/04/2022   10:50 AM 09/10/2022    9:25 AM  CMP  Glucose 70 - 99  mg/dL 93  87  865   BUN 8 - 23 mg/dL 38  30  28   Creatinine 0.44 - 1.00 mg/dL 7.84  6.96  2.95   Sodium 135 - 145 mmol/L 134  135  137   Potassium 3.5 - 5.1 mmol/L 4.7  5.3  4.6   Chloride 98 - 111 mmol/L 101  104  106   CO2 22 - 32 mmol/L 24  25  25    Calcium 8.9 - 10.3 mg/dL 28.4  9.6  9.4   Total Protein 6.5 - 8.1 g/dL 8.1  7.8  6.8   Total Bilirubin 0.3 - 1.2 mg/dL 0.3  0.4  0.3   Alkaline Phos 38 - 126 U/L 81  70  66   AST 15 - 41 U/L 18  22  17    ALT 0 - 44 U/L 14  20  17      Lipid Panel     Component Value Date/Time   CHOL 115 05/24/2021 1003   TRIG 105 05/24/2021 1003   HDL 38 (L) 05/24/2021 1003   CHOLHDL 3.0 05/24/2021 1003   CHOLHDL 4.7 05/16/2018 0420   VLDL 35 05/16/2018 0420   LDLCALC 57 05/24/2021 1003    CBC    Component Value Date/Time   WBC 6.5 02/04/2023 1444   WBC 5.8 03/01/2022 0319   RBC 3.67 (L) 02/04/2023 1444   HGB 9.7 (L) 02/04/2023 1444   HGB 10.3 (L) 05/24/2021 1003   HCT 29.8 (L) 02/04/2023 1444   HCT 32.4 (L) 05/24/2021 1003   PLT 277 02/04/2023 1444   PLT 345 05/24/2021 1003   MCV 81.2 02/04/2023 1444   MCV 72 (L) 05/24/2021 1003   MCH 26.4 02/04/2023 1444   MCHC 32.6 02/04/2023 1444   RDW 15.1 02/04/2023 1444   RDW 14.4 05/24/2021 1003   LYMPHSABS 1.2 02/04/2023 1444   LYMPHSABS 1.6 05/24/2021 1003   MONOABS 0.5 02/04/2023 1444   EOSABS 0.1 02/04/2023 1444   EOSABS 0.1 05/24/2021 1003   BASOSABS 0.0 02/04/2023 1444   BASOSABS 0.0 05/24/2021 1003    Lab Results  Component Value Date   HGBA1C 6.0 02/19/2023    Assessment & Plan:      Type 2 Diabetes Mellitus: Fasting blood sugars in the 100-130 range, A1C increased from 5.6 to 6.0. Patient was taken off Metformin due to concerns of hypoglycemia. Discussed the importance of maintaining blood sugars between 80-120 fasting and less than 180 postprandial. -Continue off Metformin. -If fasting blood sugars rise above 120, consider restarting Metformin.  Hypertension: Blood  pressure readings variable, with some elevated readings at home and consistently elevated at the oncologist's office. Patient is currently on Spironolactone, which the oncologist suggested discontinuing due to concerns of dehydration. -Discontinue Spironolactone and substitute with Diovan 160mg , -Encourage patient to continue low sodium diet and increase physical activity. -Check blood pressure in 3 months or sooner if patient experiences symptoms of hypotension.  Metastatic breast ca: Patient is on [name] 50mg  for breast cancer, which was temporarily held during treatment for shingles. Patient is due for a CT scan in 6 months, which will be coordinated with a thyroid scan. -Continue medications as directed by oncologist.  Thyroid nodule: US thyroid ordered but she is yet to undergo this -Daughter would like to coordinate CT scan and thyroid scan to occur on  the same day in 6 months.           Meds ordered this encounter  Medications   allopurinol (ZYLOPRIM) 100 MG tablet    Sig: Take 1 tablet (100 mg total) by mouth daily.    Dispense:  90 tablet    Refill:  1   amLODipine (NORVASC) 10 MG tablet    Sig: Take 1 tablet (10 mg total) by mouth daily.    Dispense:  90 tablet    Refill:  1   carvedilol (COREG) 25 MG tablet    Sig: Take 1 tablet (25 mg total) by mouth 2 (two) times daily with a meal.    Dispense:  180 tablet    Refill:  1   gabapentin (NEURONTIN) 100 MG capsule    Sig: Take 1 capsule (100 mg total) by mouth 3 (three) times daily.    Dispense:  270 capsule    Refill:  1   hydrALAZINE (APRESOLINE) 100 MG tablet    Sig: Take 1 tablet (100 mg total) by mouth 3 (three) times daily.    Dispense:  270 tablet    Refill:  1   valsartan (DIOVAN) 160 MG tablet    Sig: Take 1 tablet (160 mg total) by mouth daily.    Dispense:  90 tablet    Refill:  1    Discontinue Spironolactone    Follow-up: Return in about 3 months (around 05/22/2023).       [name], MD,  FAAFP. [facility] [place], [place] [phone]   02/19/2023, 3:35 PM

## 2023-02-19 NOTE — Patient Instructions (Signed)
Exercising to Stay Healthy To become healthy and stay healthy, it is recommended that you do moderate-intensity and vigorous-intensity exercise. You can tell that you are exercising at a moderate intensity if your heart starts beating faster and you start breathing faster but can still hold a conversation. You can tell that you are exercising at a vigorous intensity if you are breathing much harder and faster and cannot hold a conversation while exercising. How can exercise benefit me? Exercising regularly is important. It has many health benefits, such as: Improving overall fitness, flexibility, and endurance. Increasing bone density. Helping with weight control. Decreasing body fat. Increasing muscle strength and endurance. Reducing stress and tension, anxiety, depression, or anger. Improving overall health. What guidelines should I follow while exercising? Before you start a new exercise program, talk with your health care provider. Do not exercise so much that you hurt yourself, feel dizzy, or get very short of breath. Wear comfortable clothes and wear shoes with good support. Drink plenty of water while you exercise to prevent dehydration or heat stroke. Work out until your breathing and your heartbeat get faster (moderate intensity). How often should I exercise? Choose an activity that you enjoy, and set realistic goals. Your health care provider can help you make an activity plan that is individually designed and works best for you. Exercise regularly as told by your health care provider. This may include: Doing strength training two times a week, such as: Lifting weights. Using resistance bands. Push-ups. Sit-ups. Yoga. Doing a certain intensity of exercise for a given amount of time. Choose from these options: A total of 150 minutes of moderate-intensity exercise every week. A total of 75 minutes of vigorous-intensity exercise every week. A mix of moderate-intensity and  vigorous-intensity exercise every week. Children, pregnant women, people who have not exercised regularly, people who are overweight, and older adults may need to talk with a health care provider about what activities are safe to perform. If you have a medical condition, be sure to talk with your health care provider before you start a new exercise program. What are some exercise ideas? Moderate-intensity exercise ideas include: Walking 1 mile (1.6 km) in about 15 minutes. Biking. Hiking. Golfing. Dancing. Water aerobics. Vigorous-intensity exercise ideas include: Walking 4.5 miles (7.2 km) or more in about 1 hour. Jogging or running 5 miles (8 km) in about 1 hour. Biking 10 miles (16.1 km) or more in about 1 hour. Lap swimming. Roller-skating or in-line skating. Cross-country skiing. Vigorous competitive sports, such as football, basketball, and soccer. Jumping rope. Aerobic dancing. What are some everyday activities that can help me get exercise? Yard work, such as: Pushing a lawn mower. Raking and bagging leaves. Washing your car. Pushing a stroller. Shoveling snow. Gardening. Washing windows or floors. How can I be more active in my day-to-day activities? Use stairs instead of an elevator. Take a walk during your lunch break. If you drive, park your car farther away from your work or school. If you take public transportation, get off one stop early and walk the rest of the way. Stand up or walk around during all of your indoor phone calls. Get up, stretch, and walk around every 30 minutes throughout the day. Enjoy exercise with a friend. Support to continue exercising will help you keep a regular routine of activity. Where to find more information You can find more information about exercising to stay healthy from: U.S. Department of Health and Human Services: www.hhs.gov Centers for Disease Control and Prevention (  CDC): www.cdc.gov Summary Exercising regularly is  important. It will improve your overall fitness, flexibility, and endurance. Regular exercise will also improve your overall health. It can help you control your weight, reduce stress, and improve your bone density. Do not exercise so much that you hurt yourself, feel dizzy, or get very short of breath. Before you start a new exercise program, talk with your health care provider. This information is not intended to replace advice given to you by your health care provider. Make sure you discuss any questions you have with your health care provider. Document Revised: 11/11/2020 Document Reviewed: 11/11/2020 Elsevier Patient Education  2024 [employer]  

## 2023-03-01 ENCOUNTER — Encounter: Payer: Self-pay | Admitting: Family Medicine

## 2023-03-08 ENCOUNTER — Other Ambulatory Visit: Payer: Self-pay

## 2023-03-08 DIAGNOSIS — C50919 Malignant neoplasm of unspecified site of unspecified female breast: Secondary | ICD-10-CM

## 2023-03-11 ENCOUNTER — Inpatient Hospital Stay: Payer: Medicare HMO | Admitting: Pharmacist

## 2023-03-11 ENCOUNTER — Other Ambulatory Visit: Payer: Self-pay

## 2023-03-11 ENCOUNTER — Inpatient Hospital Stay: Payer: Medicare HMO | Attending: Hematology and Oncology

## 2023-03-11 ENCOUNTER — Other Ambulatory Visit: Payer: Self-pay | Admitting: Hematology and Oncology

## 2023-03-11 VITALS — BP 155/52 | HR 69 | Temp 97.4°F | Resp 18 | Ht 62.0 in | Wt 146.1 lb

## 2023-03-11 DIAGNOSIS — Z79811 Long term (current) use of aromatase inhibitors: Secondary | ICD-10-CM | POA: Insufficient documentation

## 2023-03-11 DIAGNOSIS — C50919 Malignant neoplasm of unspecified site of unspecified female breast: Secondary | ICD-10-CM

## 2023-03-11 DIAGNOSIS — C50912 Malignant neoplasm of unspecified site of left female breast: Secondary | ICD-10-CM | POA: Insufficient documentation

## 2023-03-11 DIAGNOSIS — Z170 Estrogen receptor positive status [ER+]: Secondary | ICD-10-CM | POA: Insufficient documentation

## 2023-03-11 DIAGNOSIS — C7951 Secondary malignant neoplasm of bone: Secondary | ICD-10-CM | POA: Insufficient documentation

## 2023-03-11 LAB — CBC WITH DIFFERENTIAL (CANCER CENTER ONLY)
Abs Immature Granulocytes: 0.01 10*3/uL (ref 0.00–0.07)
Basophils Absolute: 0 10*3/uL (ref 0.0–0.1)
Basophils Relative: 1 %
Eosinophils Absolute: 0.1 10*3/uL (ref 0.0–0.5)
Eosinophils Relative: 2 %
HCT: 26.9 % — ABNORMAL LOW (ref 36.0–46.0)
Hemoglobin: 8.7 g/dL — ABNORMAL LOW (ref 12.0–15.0)
Immature Granulocytes: 0 %
Lymphocytes Relative: 29 %
Lymphs Abs: 0.9 10*3/uL (ref 0.7–4.0)
MCH: 26.4 pg (ref 26.0–34.0)
MCHC: 32.3 g/dL (ref 30.0–36.0)
MCV: 81.8 fL (ref 80.0–100.0)
Monocytes Absolute: 0.4 10*3/uL (ref 0.1–1.0)
Monocytes Relative: 13 %
Neutro Abs: 1.7 10*3/uL (ref 1.7–7.7)
Neutrophils Relative %: 55 %
Platelet Count: 213 10*3/uL (ref 150–400)
RBC: 3.29 MIL/uL — ABNORMAL LOW (ref 3.87–5.11)
RDW: 16.8 % — ABNORMAL HIGH (ref 11.5–15.5)
WBC Count: 3.2 10*3/uL — ABNORMAL LOW (ref 4.0–10.5)
nRBC: 0 % (ref 0.0–0.2)

## 2023-03-11 LAB — CMP (CANCER CENTER ONLY)
ALT: 14 U/L (ref 0–44)
AST: 16 U/L (ref 15–41)
Albumin: 4.1 g/dL (ref 3.5–5.0)
Alkaline Phosphatase: 63 U/L (ref 38–126)
Anion gap: 6 (ref 5–15)
BUN: 31 mg/dL — ABNORMAL HIGH (ref 8–23)
CO2: 26 mmol/L (ref 22–32)
Calcium: 9.5 mg/dL (ref 8.9–10.3)
Chloride: 103 mmol/L (ref 98–111)
Creatinine: 1.48 mg/dL — ABNORMAL HIGH (ref 0.44–1.00)
GFR, Estimated: 36 mL/min — ABNORMAL LOW (ref >60)
Glucose, Bld: 98 mg/dL (ref 70–99)
Potassium: 4.5 mmol/L (ref 3.5–5.1)
Sodium: 135 mmol/L (ref 135–145)
Total Bilirubin: 0.3 mg/dL (ref 0.3–1.2)
Total Protein: 7.3 g/dL (ref 6.5–8.1)

## 2023-03-11 NOTE — Progress Notes (Signed)
[facility]       Telephone: [phone]?Fax: [phone]   Oncology Clinical Pharmacist Practitioner Progress Note  Heather Rogers was contacted via in-person to discuss her chemotherapy regimen for abemaciclib which they receive under the care of Dr. Serena Croissant. She is accompanied by her daughter Heather Rogers and her next door neighbor Heather Rogers.   Current treatment regimen and start date Abemaciclib (10/08/21) Letrozole (09/14/21)   Interval History She continues on abemaciclib 50 mg by mouth every 12 hours on days 1 to 28 of a 28-day cycle. This is being given  in combination with letrozole. Therapy is planned to continue until disease progression or unacceptable toxicity. She was last seen by Dr. Pamelia Hoit on 02/04/23 and clinical pharmacy on 09/14/22. Restaging scans are ordered for 06/06/23. She will see Dr. Pamelia Hoit with labs on 06/13/23 to review. She prefers to be seen every 3 months. Heather Rogers needs her appointments to be between 10 am and 12 pm due to her schedule.  Response to Therapy Heather Rogers is doing well.  She has been having some issues with her blood pressure but this is being managed by Dr. Alvis Lemmings and Heather Rogers and her mom have a good understanding of the plan.  Today her blood pressure was slightly elevated in clinic but upon recheck it was 138/62.  Heather Rogers has been drinking more flavored water since her last visit with Dr. Pamelia Hoit and her serum creatinine has come down to an estimated 1.48 mg/dL today.  Her serum calcium has also normalized.  Her hemoglobin has dropped slightly but she is asymptomatic at this time.  We did review symptoms of anemia today and she will contact Dr. Earmon Phoenix clinic prior to her next visit for any questions or concerns in the interim.  As above, she will have restaging scans in November and and she asked if the ultrasound of her thyroid could be done at the same time as her restaging scans.  Dr. Pamelia Hoit at his last visit felt this to be reasonable.  We  did give Heather Rogers the radiology number to call with regard to her scans in November, and they will likely try to schedule the thyroid ultrasound at this time which was ordered by Dr. Alvis Lemmings.  She will continue to be seen every 3 months with labs and therefore will see Dr. Pamelia Hoit in November and clinical pharmacy again in February. Labs, vitals, treatment parameters, and manufacturer guidelines assessing toxicity were reviewed with Heather Rogers today. Based on these values, patient is in agreement to continue abemaciclib therapy at this time.  Allergies Allergies  Allergen Reactions   Sulfa Antibiotics Itching    Vitals    03/11/2023   11:07 AM 02/19/2023    2:09 PM 02/19/2023    1:41 PM  Oncology Vitals  Height 158 cm  158 cm  Weight 66.271 kg  61.78 kg  Weight (lbs) 146 lbs 2 oz  136 lbs 3 oz  BMI 26.72 kg/m2  24.91 kg/m2  Temp 97.4 F (36.3 C)  97.9 F (36.6 C)  Pulse Rate 69  76  BP 155/52 146/64 165/63  Resp 18    SpO2 100 %  98 %  BSA (m2) 1.7 m2  1.64 m2    Laboratory Data    Latest Ref Rng & Units 03/11/2023   10:31 AM 02/04/2023    2:44 PM 12/04/2022   10:50 AM  CBC EXTENDED  WBC 4.0 - 10.5 K/uL 3.2  6.5  3.4  RBC 3.87 - 5.11 MIL/uL 3.29  3.67  3.92   Hemoglobin 12.0 - 15.0 g/dL 8.7  9.7  02.7   HCT 25.3 - 46.0 % 26.9  29.8  31.9   Platelets 150 - 400 K/uL 213  277  216   NEUT# 1.7 - 7.7 K/uL 1.7  4.6  1.7   Lymph# 0.7 - 4.0 K/uL 0.9  1.2  1.2        Latest Ref Rng & Units 03/11/2023   10:31 AM 02/04/2023    2:44 PM 12/04/2022   10:50 AM  CMP  Glucose 70 - 99 mg/dL 98  93  87   BUN 8 - 23 mg/dL 31  38  30   Creatinine 0.44 - 1.00 mg/dL 6.64  4.03  4.74   Sodium 135 - 145 mmol/L 135  134  135   Potassium 3.5 - 5.1 mmol/L 4.5  4.7  5.3   Chloride 98 - 111 mmol/L 103  101  104   CO2 22 - 32 mmol/L 26  24  25    Calcium 8.9 - 10.3 mg/dL 9.5  25.9  9.6   Total Protein 6.5 - 8.1 g/dL 7.3  8.1  7.8   Total Bilirubin 0.3 - 1.2 mg/dL 0.3  0.3  0.4   Alkaline  Phos 38 - 126 U/L 63  81  70   AST 15 - 41 U/L 16  18  22    ALT 0 - 44 U/L 14  14  20      No results found for: "CA2729"   Adverse Effects Assessment Hemoglobin: will  monitor Serum creatinine: has come down to 1.48 mg/dL today. Was 1.81 mg/dL at last visit Serum calcium: now WNL. Was elevated at last visit  Adherence Assessment Heather Rogers reports missing 0 doses over the past 12 weeks.   Reason for missed dose: Not applicable.  Did hold abemaciclib for approximately 1 week prior to her last visit with Dr. Pamelia Hoit because she was on valacyclovir and was recovering from shingles Patient was re-educated on importance of adherence.   Access Assessment Heather Rogers is currently receiving her abemaciclib through [employer] concerns: None  Medication Reconciliation The patient's medication list was reviewed today with the patient?  Yes New medications or herbal supplements have recently been started?  No Any medications have been discontinued?  No, we did remove the prochlorperazine today per patient request. The medication list was updated and reconciled based on the patient's most recent medication list in the electronic medical record (EMR) including herbal products and OTC medications.   Medications Current Outpatient Medications  Medication Sig Dispense Refill   Accu-Chek FastClix Lancets MISC USE TO CHECK FASTING BLOOD SUGAR AND BEFORE MEALS AND AGAIN IF PATIENT FEELS BAD (SYMPTOMS OF HYPO) AS DIRECTED BY YOUR PRESCRIBER 102 each 11   allopurinol (ZYLOPRIM) 100 MG tablet Take 1 tablet (100 mg total) by mouth daily. 90 tablet 1   amLODipine (NORVASC) 10 MG tablet Take 1 tablet (10 mg total) by mouth daily. 90 tablet 1   aspirin EC 81 MG tablet Take 81 mg by mouth in the morning. Swallow whole.     atorvastatin (LIPITOR) 80 MG tablet TAKE 1 TABLET EVERY EVENING 90 tablet 0   Blood Glucose Monitoring Suppl (ACCU-CHEK GUIDE) w/Device KIT 1 each by  Does not apply route as directed. 1 kit 0   carvedilol (COREG) 25 MG tablet Take 1 tablet (25 mg total) by mouth 2 (two)  times daily with a meal. 180 tablet 1   cholecalciferol (VITAMIN D3) 25 MCG (1000 UNIT) tablet Take 1,000 Units by mouth daily.     ferrous sulfate 325 (65 FE) MG EC tablet Take 1 tablet (325 mg total) by mouth in the morning and at bedtime. 60 tablet 1   gabapentin (NEURONTIN) 100 MG capsule Take 1 capsule (100 mg total) by mouth 3 (three) times daily. 270 capsule 1   glucose blood (ACCU-CHEK GUIDE) test strip USE TO CHECK FASTING BLOOD SUGAR AND BEFORE MEALS AND AGAIN IF PATIENT FEELS BAD; SYMPTOMS OF HYPOGLYCEMIA 100 strip 11   letrozole (FEMARA) 2.5 MG tablet TAKE 1 TABLET BY MOUTH DAILY 90 tablet 3   Multiple Vitamin (MULTIVITAMIN WITH MINERALS) TABS tablet Take 1 tablet by mouth daily. 30 tablet 0   ondansetron (ZOFRAN) 8 MG tablet TAKE ONE TABLET BY MOUTH EVERY 8 HOURS AS NEEDED FOR NAUSEA/VOMITING 30 tablet 2   polyethylene glycol (MIRALAX / GLYCOLAX) 17 g packet Take 17 g by mouth daily as needed (constipation.).     [name] 50 MG tablet TAKE 1 TABLET BY MOUTH TWICE DAILY. SWALLOW TABLETS WHOLE. DO NOT CHEW, CRUSH OR SPLIT TABLETS BEFORE SWALLOWING. 56 tablet 0   hydrALAZINE (APRESOLINE) 100 MG tablet Take 1 tablet (100 mg total) by mouth 3 (three) times daily. (Patient not taking: Reported on 03/11/2023) 270 tablet 1   valsartan (DIOVAN) 160 MG tablet Take 1 tablet (160 mg total) by mouth daily. (Patient not taking: Reported on 03/11/2023) 90 tablet 1   No current facility-administered medications for this visit.   Drug-Drug Interactions (DDIs) DDIs were evaluated? Yes Significant DDIs? No  The patient was instructed to speak with their health care provider and/or the oral chemotherapy pharmacist before starting any new drug, including prescription or over the counter, natural / herbal products, or vitamins.  Supportive Care Diarrhea: we reviewed that diarrhea is  common with abemaciclib and confirmed that she does have loperamide (Imodium) at home.  We reviewed how to take this medication PRN. Neutropenia: we discussed the importance of having a thermometer and what the Centers for Disease Control and Prevention (CDC) considers a fever which is 100.34F (38C) or higher.  Gave patient 24/7 triage line to call if any fevers or symptoms. ILD/Pneumonitis: we reviewed potential symptoms including cough, shortness, and fatigue.  VTE: reviewed signs of DVT such as leg swelling, redness, pain, or tenderness and signs of PE such as shortness of breath, rapid or irregular heartbeat, cough, chest pain, or lightheadedness. Reviewed to take the medication every 12 hours (with food sometimes can be easier on the stomach) and to take it at the same time every day. Hepatotoxicity:WNL Drug interactions with grapefruit products  Dosing Assessment Hepatic adjustments needed? No  Renal adjustments needed? No  Toxicity adjustments needed? No  The current dosing regimen is appropriate to continue at this time.  Follow-Up Plan Continue abemaciclib 50 mg by mouth every 12 hours.  She continues to receive this from [facility] pharmacy Continue letrozole 2.5 mg by mouth daily Continue to monitor Scr, calcium, ANC, Hgb Discussed starting a bone strengthener per Dr. Earmon Phoenix recommendations.  Dental clearance form has been given to Heather Rogers and her daughter.  They describe that she does have poor dental hygiene and has never been to a dentist before.  We discussed that Dr. Pamelia Hoit prefers dental work be done and then 3 months prior to starting denosumab.  She has not had teeth pulled yet but will let  Dr. Earmon Phoenix clinic know when this has taken place.  He will likely start a bone strengthener (denosumab likely due to renal impairment) 3 months after her dental procedures. Continue to follow with PCP for blood pressure and diabetes management.  Appreciate Dr. Baxter Flattery  recommendations. Restaging scans ordered with an expected date of 06/06/23.  Radiology scheduling form given. She will then see Dr. Pamelia Hoit with labs on 06/13/23.  Will send message to scheduling that Dr. Earmon Phoenix visit needs to be moved up to between 10 AM and 12 PM per Heather Rogers is working scheduled. Will add labs, pharmacy clinic visit, in 6 months (February 2025). Heather Rogers prefers every 58-month visits with Dr. Pamelia Hoit and clinical pharmacy.  She knows that she can contact the clinic sooner if needed if any concerns arise.  We will see her back in approximately 6 months.  Heather Rogers participated in the discussion, expressed understanding, and voiced agreement with the above plan. All questions were answered to her satisfaction. The patient was advised to contact the clinic at [phone] with any questions or concerns prior to her return visit.   I spent 30 minutes assessing and educating the patient.   A. Odetta Pink, PharmD, BCOP, CPP  Anselm Lis, RPH-CPP, 03/11/2023  11:49 AM   **Disclaimer: This note was dictated with voice recognition software. Similar sounding words can inadvertently be transcribed and this note may contain transcription errors which may not have been corrected upon publication of note.**

## 2023-03-13 ENCOUNTER — Telehealth: Payer: Self-pay | Admitting: Pharmacist

## 2023-03-13 NOTE — Telephone Encounter (Signed)
Scheduled appointments per 8/12 los. Talked to the patients daughter [name] and she is aware of the made appointments for the patient.

## 2023-03-27 ENCOUNTER — Other Ambulatory Visit: Payer: Self-pay | Admitting: Pharmacist

## 2023-03-27 NOTE — Progress Notes (Signed)
Pharmacy Quality Measure Review  This patient is appearing on a report for being at risk of failing the adherence measure for diabetes medications this calendar year.   Medication: metformin Last fill date: 11/20/2022 for 90 day supply. PCP stopped this at [facility] on 11/20/22. DM is controlled off metformin.   Insurance report was not up to date. No action needed at this time.   Butch Penny, PharmD, P[place]y [place], CPP Clinical Pharmacist [facility] [phone]

## 2023-04-03 ENCOUNTER — Other Ambulatory Visit: Payer: Self-pay | Admitting: Hematology and Oncology

## 2023-04-21 ENCOUNTER — Other Ambulatory Visit: Payer: Self-pay | Admitting: Family Medicine

## 2023-04-21 DIAGNOSIS — I672 Cerebral atherosclerosis: Secondary | ICD-10-CM

## 2023-04-21 DIAGNOSIS — Z8673 Personal history of transient ischemic attack (TIA), and cerebral infarction without residual deficits: Secondary | ICD-10-CM

## 2023-04-26 ENCOUNTER — Other Ambulatory Visit: Payer: Self-pay | Admitting: Hematology and Oncology

## 2023-05-21 ENCOUNTER — Encounter: Payer: Self-pay | Admitting: *Deleted

## 2023-05-21 NOTE — Progress Notes (Signed)
Received Dental Clearance from [facility] [phone]) stating pt has poor dental health and pt will need multiple extractions.  Dental Clearance sent to HIM to be scanned into pt chart.  Per oral oncology pharmacy provider, pt okay to proceed with dental extractions and no restrictions.  RN faxed clearance to [phone].

## 2023-05-22 ENCOUNTER — Ambulatory Visit: Payer: Medicare HMO | Attending: Family Medicine | Admitting: Family Medicine

## 2023-05-22 ENCOUNTER — Encounter: Payer: Self-pay | Admitting: Family Medicine

## 2023-05-22 VITALS — BP 124/66 | HR 69 | Ht 62.0 in | Wt 157.4 lb

## 2023-05-22 DIAGNOSIS — Z23 Encounter for immunization: Secondary | ICD-10-CM | POA: Diagnosis not present

## 2023-05-22 DIAGNOSIS — R609 Edema, unspecified: Secondary | ICD-10-CM

## 2023-05-22 DIAGNOSIS — I152 Hypertension secondary to endocrine disorders: Secondary | ICD-10-CM | POA: Diagnosis not present

## 2023-05-22 DIAGNOSIS — C50919 Malignant neoplasm of unspecified site of unspecified female breast: Secondary | ICD-10-CM

## 2023-05-22 DIAGNOSIS — E1159 Type 2 diabetes mellitus with other circulatory complications: Secondary | ICD-10-CM

## 2023-05-22 DIAGNOSIS — E1141 Type 2 diabetes mellitus with diabetic mononeuropathy: Secondary | ICD-10-CM | POA: Diagnosis not present

## 2023-05-22 LAB — POCT GLYCOSYLATED HEMOGLOBIN (HGB A1C): HbA1c, POC (controlled diabetic range): 6.2 % (ref 0.0–7.0)

## 2023-05-22 NOTE — Progress Notes (Signed)
Subjective:  Patient ID: Heather Rogers, female    DOB: Jan 15, 1946  Age: 77 y.o. MRN: [id number]  CC: Medical Management of Chronic Issues   HPI Heather Rogers is a 77 y.o. year old female with a history of  type 2 diabetes mellitus (A1c 6.0), hypertension, gout, history of CVA, status post loop recorder placement in 2019, metastatic L breast cancer (pulmonary and bony metastasis), thyroid nodule. She is accompanied by her daughter, Heather Rogers to today's visit.  Interval History: Discussed the use of AI scribe software for clinical note transcription with the patient, who gave verbal consent to proceed.   The patient's blood pressure has been controlled without the use of hydralazine or valsartan, with readings ranging from 128/58 to 140/87 and a single value of 152/68. The patient's blood sugar levels have been slightly elevated, with readings in the low 100s. The caregiver has been managing the patient's blood sugar levels with metformin as needed but this was previously discontinued due to excellent diabetic control.  Her daughter was concerned that with her ingesting a lot of snacks her diabetes would be uncontrolled.  The patient has also been experiencing swelling in the feet, which started last week. The patient's dietary habits have included eating large quantities of snacks such as potato chips and grapes.      He continues to follow-up with oncology for management of her breast cancer.  Past Medical History:  Diagnosis Date   Chronic kidney disease, stage 3b (HCC)    Diabetes (HCC)    Hypertension    Pneumonia due to COVID-19 virus 07/25/2020   Primary malignant neoplasm of breast with metastasis (HCC) 09/14/2021   Stroke (cerebrum) (HCC)    Stroke (HCC) 05/15/2018    Past Surgical History:  Procedure Laterality Date   CHEST TUBE INSERTION Left 09/07/2021   Procedure: CHEST TUBE INSERTION;  Surgeon: Lorin Glass, MD;  Location: [facility] ENDOSCOPY;  Service: Pulmonary;   Laterality: Left;   CHEST TUBE INSERTION N/A 03/19/2022   Procedure: REMOVAL PLEURAL DRAINAGE CATHETER;  Surgeon: Omar Person, MD;  Location: [facility] ENDOSCOPY;  Service: Pulmonary;  Laterality: N/A;   EYE SURGERY Left 04/06/2020   Implant placed   EYE SURGERY Right 03/30/2020   Implant placed    hysterectomy     LOOP RECORDER INSERTION N/A 05/20/2018   Procedure: LOOP RECORDER INSERTION;  Surgeon: Hillis Range, MD;  Location: [facility] INVASIVE CV LAB;  Service: Cardiovascular;  Laterality: N/A;   TEE WITHOUT CARDIOVERSION N/A 05/20/2018   Procedure: TRANSESOPHAGEAL ECHOCARDIOGRAM (TEE);  Surgeon: Quintella Reichert, MD;  Location: [facility] ENDOSCOPY;  Service: Cardiovascular;  Laterality: N/A;    Family History  Problem Relation Age of Onset   Hypertension Mother    Stroke Mother    Hypertension Father     Social History   Socioeconomic History   Marital status: Divorced    Spouse name: Not on file   Number of children: Not on file   Years of education: Not on file   Highest education level: 12th grade  Occupational History   Not on file  Tobacco Use   Smoking status: Former    Current packs/day: 0.00    Average packs/day: 1 pack/day for 15.0 years (15.0 ttl pk-yrs)    Types: Cigarettes    Start date: 07/30/1970    Quit date: 07/30/1985    Years since quitting: 37.8   Smokeless tobacco: Never  Vaping Use   Vaping status: Never Used  Substance and Sexual Activity  Alcohol use: Never   Drug use: Never   Sexual activity: Not Currently  Other Topics Concern   Not on file  Social History Narrative   Lives in [place]; relies on daughter for transportation; grandson lives with her    Social Determinants of Health   Financial Resource Strain: Low Risk  (05/20/2023)   Overall Financial Resource Strain (CARDIA)    Difficulty of Paying Living Expenses: Not hard at all  Food Insecurity: No Food Insecurity (05/20/2023)   Hunger Vital Sign    Worried About Running Out of Food in the  Last Year: Never true    Ran Out of Food in the Last Year: Never true  Transportation Needs: No Transportation Needs (05/20/2023)   PRAPARE - Administrator, Civil Service (Medical): No    Lack of Transportation (Non-Medical): No  Physical Activity: Inactive (05/20/2023)   Exercise Vital Sign    Days of Exercise per Week: 0 days    Minutes of Exercise per Session: 0 min  Stress: No Stress Concern Present (05/20/2023)   [employer] of Occupational Health - Occupational Stress Questionnaire    Feeling of Stress : Not at all  Social Connections: Moderately Integrated (05/20/2023)   Social Connection and Isolation Panel [NHANES]    Frequency of Communication with Friends and Family: More than three times a week    Frequency of Social Gatherings with Friends and Family: More than three times a week    Attends Religious Services: More than 4 times per year    Active Member of [employer] or Organizations: Yes    Attends Engineer, structural: More than 4 times per year    Marital Status: Divorced    Allergies  Allergen Reactions   Sulfa Antibiotics Itching    Outpatient Medications Prior to Visit  Medication Sig Dispense Refill   Accu-Chek FastClix Lancets MISC USE TO CHECK FASTING BLOOD SUGAR AND BEFORE MEALS AND AGAIN IF PATIENT FEELS BAD (SYMPTOMS OF HYPO) AS DIRECTED BY YOUR PRESCRIBER 102 each 11   allopurinol (ZYLOPRIM) 100 MG tablet Take 1 tablet (100 mg total) by mouth daily. 90 tablet 1   amLODipine (NORVASC) 10 MG tablet Take 1 tablet (10 mg total) by mouth daily. 90 tablet 1   aspirin EC 81 MG tablet Take 81 mg by mouth in the morning. Swallow whole.     atorvastatin (LIPITOR) 80 MG tablet TAKE 1 TABLET EVERY EVENING 90 tablet 3   Blood Glucose Monitoring Suppl (ACCU-CHEK GUIDE) w/Device KIT 1 each by Does not apply route as directed. 1 kit 0   carvedilol (COREG) 25 MG tablet Take 1 tablet (25 mg total) by mouth 2 (two) times daily with a meal. 180 tablet 1    cholecalciferol (VITAMIN D3) 25 MCG (1000 UNIT) tablet Take 1,000 Units by mouth daily.     ferrous sulfate 325 (65 FE) MG EC tablet Take 1 tablet (325 mg total) by mouth in the morning and at bedtime. 60 tablet 1   gabapentin (NEURONTIN) 100 MG capsule Take 1 capsule (100 mg total) by mouth 3 (three) times daily. 270 capsule 1   glucose blood (ACCU-CHEK GUIDE) test strip USE TO CHECK FASTING BLOOD SUGAR AND BEFORE MEALS AND AGAIN IF PATIENT FEELS BAD; SYMPTOMS OF HYPOGLYCEMIA 100 strip 11   letrozole (FEMARA) 2.5 MG tablet TAKE 1 TABLET BY MOUTH DAILY 90 tablet 3   Multiple Vitamin (MULTIVITAMIN WITH MINERALS) TABS tablet Take 1 tablet by mouth daily. 30 tablet 0  ondansetron (ZOFRAN) 8 MG tablet TAKE ONE TABLET BY MOUTH EVERY 8 HOURS AS NEEDED FOR NAUSEA/VOMITING 30 tablet 2   polyethylene glycol (MIRALAX / GLYCOLAX) 17 g packet Take 17 g by mouth daily as needed (constipation.).     [name] 50 MG tablet TAKE 1 TABLET BY MOUTH TWO TIMES DAILY. SWALLOW WHOLE TABLETS; DO NOT CRUSH, CHEW, OR SPLIT TABLETS BEFORE SWALLOWING. 56 tablet 5   hydrALAZINE (APRESOLINE) 100 MG tablet Take 1 tablet (100 mg total) by mouth 3 (three) times daily. (Patient not taking: Reported on 03/11/2023) 270 tablet 1   valsartan (DIOVAN) 160 MG tablet Take 1 tablet (160 mg total) by mouth daily. (Patient not taking: Reported on 03/11/2023) 90 tablet 1   No facility-administered medications prior to visit.     ROS Review of Systems  Constitutional:  Negative for activity change and appetite change.  HENT:  Negative for sinus pressure and sore throat.   Respiratory:  Negative for chest tightness, shortness of breath and wheezing.   Cardiovascular:  Positive for leg swelling. Negative for chest pain and palpitations.  Gastrointestinal:  Negative for abdominal distention, abdominal pain and constipation.  Genitourinary: Negative.   Musculoskeletal: Negative.   Psychiatric/Behavioral:  Negative for behavioral problems  and dysphoric mood.     Objective:  BP 124/66   Pulse 69   Ht 5\' 2"  (1.575 m)   Wt 157 lb 6.4 oz (71.4 kg)   SpO2 100%   BMI 28.79 kg/m      05/22/2023   10:41 AM 05/22/2023   10:00 AM 03/11/2023   11:07 AM  BP/Weight  Systolic BP 124 152 155  Diastolic BP 66 68 52  Wt. (Lbs)  157.4 146.1  BMI  28.79 kg/m2 26.72 kg/m2      Physical Exam Constitutional:      Appearance: She is well-developed.  Cardiovascular:     Rate and Rhythm: Normal rate.     Heart sounds: Normal heart sounds. No murmur heard. Pulmonary:     Effort: Pulmonary effort is normal.     Breath sounds: Normal breath sounds. No wheezing or rales.  Chest:     Chest wall: No tenderness.  Abdominal:     General: Bowel sounds are normal. There is no distension.     Palpations: Abdomen is soft. There is no mass.     Tenderness: There is no abdominal tenderness.  Musculoskeletal:        General: Normal range of motion.     Right lower leg: Edema (1+ ankle) present.     Left lower leg: No edema.  Neurological:     Mental Status: She is alert and oriented to person, place, and time.  Psychiatric:        Mood and Affect: Mood normal.        Latest Ref Rng & Units 03/11/2023   10:31 AM 02/04/2023    2:44 PM 12/04/2022   10:50 AM  CMP  Glucose 70 - 99 mg/dL 98  93  87   BUN 8 - 23 mg/dL 31  38  30   Creatinine 0.44 - 1.00 mg/dL 5.95  6.38  7.56   Sodium 135 - 145 mmol/L 135  134  135   Potassium 3.5 - 5.1 mmol/L 4.5  4.7  5.3   Chloride 98 - 111 mmol/L 103  101  104   CO2 22 - 32 mmol/L 26  24  25    Calcium 8.9 - 10.3 mg/dL 9.5  43.3  9.6   Total Protein 6.5 - 8.1 g/dL 7.3  8.1  7.8   Total Bilirubin 0.3 - 1.2 mg/dL 0.3  0.3  0.4   Alkaline Phos 38 - 126 U/L 63  81  70   AST 15 - 41 U/L 16  18  22    ALT 0 - 44 U/L 14  14  20      Lipid Panel     Component Value Date/Time   CHOL 115 05/24/2021 1003   TRIG 105 05/24/2021 1003   HDL 38 (L) 05/24/2021 1003   CHOLHDL 3.0 05/24/2021 1003   CHOLHDL  4.7 05/16/2018 0420   VLDL 35 05/16/2018 0420   LDLCALC 57 05/24/2021 1003    CBC    Component Value Date/Time   WBC 3.2 (L) 03/11/2023 1031   WBC 5.8 03/01/2022 0319   RBC 3.29 (L) 03/11/2023 1031   HGB 8.7 (L) 03/11/2023 1031   HGB 10.3 (L) 05/24/2021 1003   HCT 26.9 (L) 03/11/2023 1031   HCT 32.4 (L) 05/24/2021 1003   PLT 213 03/11/2023 1031   PLT 345 05/24/2021 1003   MCV 81.8 03/11/2023 1031   MCV 72 (L) 05/24/2021 1003   MCH 26.4 03/11/2023 1031   MCHC 32.3 03/11/2023 1031   RDW 16.8 (H) 03/11/2023 1031   RDW 14.4 05/24/2021 1003   LYMPHSABS 0.9 03/11/2023 1031   LYMPHSABS 1.6 05/24/2021 1003   MONOABS 0.4 03/11/2023 1031   EOSABS 0.1 03/11/2023 1031   EOSABS 0.1 05/24/2021 1003   BASOSABS 0.0 03/11/2023 1031   BASOSABS 0.0 05/24/2021 1003    Lab Results  Component Value Date   HGBA1C 6.2 05/22/2023    Assessment & Plan:      Hypertension Blood pressure readings have been variable, with some elevated readings. The patient has stopped taking hydralazine and valsartan, and is currently on amlodipine 10mg  and carvedilol 25mg  twice daily. The patient's blood pressure was elevated in the office and recheck after she had sat down for few minutes which are normal. -Continue amlodipine 10mg  and carvedilol 25mg  twice daily. -Discontinue hydralazine and valsartan. Counseled on blood pressure goal of less than 130/80, low-sodium, DASH diet, medication compliance, 150 minutes of moderate intensity exercise per week. Discussed medication compliance, adverse effects.   Type 2 diabetes mellitus Fasting blood glucose readings have been slightly elevated, but the patient's A1c is 6.2, up from 6.0. The patient has been taking metformin intermittently due to concerns about elevated blood glucose readings. -Continue monitoring blood glucose readings at home. -Consider resuming metformin if fasting blood glucose readings consistently exceed 150. -Order fasting lipid panel, to be  completed at the patient's convenience.  Lower extremity edema The patient has noticed swelling in her feet over the past week, possibly related to dietary sodium intake. -Advise the patient to limit dietary sodium intake. -Continue monitoring for edema. -Encouraged to comply with a low-sodium diet, elevate feet, use compression stockings  Malignant plasm of breast with metastasis -Currently on Femara and [name] -Continue to follow-up with oncology   Follow-up The patient has upcoming appointments with oncology and for a CT scan. The patient will return to the office in three months for follow-up. -Continue current management and follow-up as planned.          No orders of the defined types were placed in this encounter.   Follow-up: Return in about 3 months (around 08/22/2023) for Chronic medical conditions.       [name], MD, FAAFP. [facility]  and Wellness [place], [place] [phone]   05/22/2023, 11:28 AM

## 2023-05-22 NOTE — Patient Instructions (Signed)
VISIT SUMMARY:  During today's visit, we reviewed your blood pressure and blood sugar levels, as well as addressed the swelling in your feet. We discussed your current medications and dietary habits, and made some adjustments to your treatment plan to better manage your conditions.  YOUR PLAN:  -HYPERTENSION: Hypertension, or high blood pressure, means that the force of the blood against your artery walls is too high. Your blood pressure readings have been variable, and we have decided to continue your current medications, amlodipine 10mg  and carvedilol 25mg  twice daily, while discontinuing hydralazine and valsartan. We will recheck your blood pressure in the office today.  Upon recheck, your blood pressure was normal so continue with your current medication.  -DIABETES: Diabetes is a condition that affects how your body processes blood sugar. Your blood sugar levels have been slightly elevated, and your A1c has increased to 6.2. We recommend continuing to monitor your blood glucose readings at home and considering resuming metformin if your fasting blood glucose readings consistently exceed 150. We will also order a fasting lipid panel for you to complete at your convenience.  -LOWER EXTREMITY EDEMA: Lower extremity edema is swelling in the feet and legs, which can be caused by various factors including high sodium intake. We advise you to limit your dietary sodium intake and continue monitoring for any swelling.  -GENERAL HEALTH MAINTENANCE: We reviewed your routine labs and found them to be normal. We recommend continuing with your current lifestyle and dietary habits, with an emphasis on reducing sodium intake to help with the swelling in your feet.  INSTRUCTIONS:  Please complete the fasting lipid panel at your convenience. Continue monitoring your blood glucose readings at home and consider resuming metformin if your fasting blood glucose readings consistently exceed 150. We will recheck your  blood pressure in the office today. You have upcoming appointments with oncology and for a CT scan. Please return to the office in three months for follow-up.

## 2023-05-30 ENCOUNTER — Telehealth: Payer: Self-pay | Admitting: Pharmacy Technician

## 2023-05-30 NOTE — Telephone Encounter (Signed)
Oral Oncology Patient Advocate Encounter   Received notification that patient is due for re-enrollment for assistance for [name] through [employer].   Re-enrollment process has been initiated and will be submitted upon completion of necessary documents.   Re-enrollment documents requiring patient signature sent to email: [email or web address]  [phone]   [facility] phone number [phone].   I will continue to follow until final determination.   Jinger Neighbors, CPhT-Adv Oncology Pharmacy Patient Advocate [facility] Direct Number: [phone]  Fax: [phone]

## 2023-06-04 NOTE — Telephone Encounter (Signed)
Oral Oncology Patient Advocate Encounter   Submitted hardcopy rx for [employer] to [employer].   Application submitted via e-fax to [phone]   [facility] phone number [phone].   I will continue to check the status until final determination.   Jinger Neighbors, CPhT-Adv Oncology Pharmacy Patient Advocate [facility] Direct Number: [phone]  Fax: [phone]

## 2023-06-05 ENCOUNTER — Other Ambulatory Visit: Payer: Self-pay | Admitting: Hematology and Oncology

## 2023-06-05 ENCOUNTER — Ambulatory Visit (HOSPITAL_COMMUNITY)
Admission: RE | Admit: 2023-06-05 | Discharge: 2023-06-05 | Disposition: A | Discharge status: Home / Self Care | Payer: Medicare HMO | Source: Ambulatory Visit | Attending: Family Medicine | Admitting: Family Medicine

## 2023-06-05 ENCOUNTER — Ambulatory Visit (HOSPITAL_COMMUNITY)
Admission: RE | Admit: 2023-06-05 | Discharge: 2023-06-05 | Disposition: A | Discharge status: Home / Self Care | Payer: Medicare HMO | Source: Ambulatory Visit | Attending: Hematology and Oncology | Admitting: Hematology and Oncology

## 2023-06-05 ENCOUNTER — Ambulatory Visit (HOSPITAL_COMMUNITY): Payer: Medicare HMO

## 2023-06-05 DIAGNOSIS — E041 Nontoxic single thyroid nodule: Secondary | ICD-10-CM

## 2023-06-05 DIAGNOSIS — C50919 Malignant neoplasm of unspecified site of unspecified female breast: Secondary | ICD-10-CM | POA: Diagnosis not present

## 2023-06-05 DIAGNOSIS — E042 Nontoxic multinodular goiter: Secondary | ICD-10-CM | POA: Diagnosis not present

## 2023-06-05 DIAGNOSIS — C7951 Secondary malignant neoplasm of bone: Secondary | ICD-10-CM | POA: Diagnosis not present

## 2023-06-05 DIAGNOSIS — C787 Secondary malignant neoplasm of liver and intrahepatic bile duct: Secondary | ICD-10-CM | POA: Diagnosis not present

## 2023-06-05 DIAGNOSIS — C7800 Secondary malignant neoplasm of unspecified lung: Secondary | ICD-10-CM | POA: Diagnosis not present

## 2023-06-05 MED ORDER — IOHEXOL 300 MG/ML  SOLN: 60.0000 mL | Freq: Once | INTRAMUSCULAR | Status: DC | PRN

## 2023-06-05 NOTE — Telephone Encounter (Signed)
Oral Oncology Patient Advocate Encounter   Submitted application for assistance for [name] to [employer].   Application submitted via e-fax to [phone]   [employer] phone number [phone].   I will continue to check the status until final determination.   Heather Rogers, CPhT-Adv Oncology Pharmacy Patient Advocate [facility] Direct Number: [phone]  Fax: [phone]   

## 2023-06-10 NOTE — Telephone Encounter (Signed)
 Oral Oncology Patient Advocate Encounter   Received notification re-enrollment for assistance for [name] through [employer] has been approved. Patient may continue to receive their medication at $0 from this program.    [employer] phone number [phone].   Effective dates: 07/31/23 through 07/29/24  I have spoken to the patient.  Jinger Neighbors, CPhT-Adv Oncology Pharmacy Patient Advocate [facility] Direct Number: [phone]  Fax: [phone]

## 2023-06-12 ENCOUNTER — Other Ambulatory Visit: Payer: Self-pay | Admitting: *Deleted

## 2023-06-12 DIAGNOSIS — C50919 Malignant neoplasm of unspecified site of unspecified female breast: Secondary | ICD-10-CM

## 2023-06-13 ENCOUNTER — Telehealth: Payer: Self-pay

## 2023-06-13 ENCOUNTER — Other Ambulatory Visit: Payer: Self-pay | Admitting: *Deleted

## 2023-06-13 ENCOUNTER — Inpatient Hospital Stay: Payer: Medicare HMO

## 2023-06-13 ENCOUNTER — Ambulatory Visit: Payer: Medicare HMO | Admitting: Hematology and Oncology

## 2023-06-13 ENCOUNTER — Inpatient Hospital Stay: Payer: Medicare HMO | Attending: Hematology and Oncology | Admitting: Hematology and Oncology

## 2023-06-13 VITALS — BP 158/50 | HR 67 | Temp 97.3°F | Resp 18 | Ht 62.0 in | Wt 158.5 lb

## 2023-06-13 DIAGNOSIS — K5900 Constipation, unspecified: Secondary | ICD-10-CM | POA: Insufficient documentation

## 2023-06-13 DIAGNOSIS — C50919 Malignant neoplasm of unspecified site of unspecified female breast: Secondary | ICD-10-CM

## 2023-06-13 DIAGNOSIS — R197 Diarrhea, unspecified: Secondary | ICD-10-CM | POA: Diagnosis not present

## 2023-06-13 DIAGNOSIS — Z170 Estrogen receptor positive status [ER+]: Secondary | ICD-10-CM | POA: Diagnosis not present

## 2023-06-13 DIAGNOSIS — C7951 Secondary malignant neoplasm of bone: Secondary | ICD-10-CM | POA: Insufficient documentation

## 2023-06-13 DIAGNOSIS — R5383 Other fatigue: Secondary | ICD-10-CM | POA: Insufficient documentation

## 2023-06-13 DIAGNOSIS — C50912 Malignant neoplasm of unspecified site of left female breast: Secondary | ICD-10-CM | POA: Insufficient documentation

## 2023-06-13 DIAGNOSIS — B029 Zoster without complications: Secondary | ICD-10-CM | POA: Diagnosis not present

## 2023-06-13 DIAGNOSIS — Z79811 Long term (current) use of aromatase inhibitors: Secondary | ICD-10-CM | POA: Diagnosis not present

## 2023-06-13 DIAGNOSIS — Z79899 Other long term (current) drug therapy: Secondary | ICD-10-CM | POA: Insufficient documentation

## 2023-06-13 DIAGNOSIS — J910 Malignant pleural effusion: Secondary | ICD-10-CM | POA: Diagnosis not present

## 2023-06-13 LAB — CBC WITH DIFFERENTIAL (CANCER CENTER ONLY)
Abs Immature Granulocytes: 0.01 10*3/uL (ref 0.00–0.07)
Basophils Absolute: 0 10*3/uL (ref 0.0–0.1)
Basophils Relative: 1 %
Eosinophils Absolute: 0.1 10*3/uL (ref 0.0–0.5)
Eosinophils Relative: 2 %
HCT: 33.6 % — ABNORMAL LOW (ref 36.0–46.0)
Hemoglobin: 10.9 g/dL — ABNORMAL LOW (ref 12.0–15.0)
Immature Granulocytes: 0 %
Lymphocytes Relative: 31 %
Lymphs Abs: 1.1 10*3/uL (ref 0.7–4.0)
MCH: 27.3 pg (ref 26.0–34.0)
MCHC: 32.4 g/dL (ref 30.0–36.0)
MCV: 84 fL (ref 80.0–100.0)
Monocytes Absolute: 0.3 10*3/uL (ref 0.1–1.0)
Monocytes Relative: 9 %
Neutro Abs: 2 10*3/uL (ref 1.7–7.7)
Neutrophils Relative %: 57 %
Platelet Count: 189 10*3/uL (ref 150–400)
RBC: 4 MIL/uL (ref 3.87–5.11)
RDW: 13.5 % (ref 11.5–15.5)
WBC Count: 3.6 10*3/uL — ABNORMAL LOW (ref 4.0–10.5)
nRBC: 0 % (ref 0.0–0.2)

## 2023-06-13 LAB — CMP (CANCER CENTER ONLY)
ALT: 17 U/L (ref 0–44)
AST: 22 U/L (ref 15–41)
Albumin: 4.2 g/dL (ref 3.5–5.0)
Alkaline Phosphatase: 88 U/L (ref 38–126)
Anion gap: 4 — ABNORMAL LOW (ref 5–15)
BUN: 32 mg/dL — ABNORMAL HIGH (ref 8–23)
CO2: 30 mmol/L (ref 22–32)
Calcium: 10.2 mg/dL (ref 8.9–10.3)
Chloride: 106 mmol/L (ref 98–111)
Creatinine: 1.74 mg/dL — ABNORMAL HIGH (ref 0.44–1.00)
GFR, Estimated: 30 mL/min — ABNORMAL LOW (ref >60)
Glucose, Bld: 94 mg/dL (ref 70–99)
Potassium: 4.8 mmol/L (ref 3.5–5.1)
Sodium: 140 mmol/L (ref 135–145)
Total Bilirubin: 0.4 mg/dL (ref <1.2)
Total Protein: 7.7 g/dL (ref 6.5–8.1)

## 2023-06-13 NOTE — Progress Notes (Signed)
Patient Care Team: [name], MD as PCP - General (Family Medicine) Anselm Lis, RPH-CPP as Pharmacist (Hematology and Oncology) Serena Croissant, MD as Medical Oncologist (Hematology and Oncology) Riley Churches, RN as [facility] Care Management Meier, Ivor Costa, MD (Inactive) as Consulting Physician (Pulmonary Disease)  DIAGNOSIS:  Encounter Diagnosis  Name Primary?   Primary malignant neoplasm of breast with metastasis (HCC) Yes    SUMMARY OF ONCOLOGIC HISTORY: Oncology History  Breast mass, left  09/04/2021 Initial Diagnosis   Breast mass, left   Primary malignant neoplasm of breast with metastasis (HCC)  09/14/2021 Initial Diagnosis   Large neglected left breast cancer for several years.  Hospitalization 09/02/2021-09/08/2021: Shortness of breath, large pleural effusion, thoracentesis, pleural fluid cytology: Metastatic adenocarcinoma breast origin ER/PR positive HER2 negative     CHIEF COMPLIANT: Follow-up to discuss results of CT scans  HISTORY OF PRESENT ILLNESS:   History of Present Illness   The patient, with a history of cancer, has been undergoing treatment with [name] and letrozole. Recent scans show an increase in size of lung nodules and the emergence of a new spots in the liver, indicating progression of the disease. The patient's bones remain stable with no changes observed. The patient has been on the current treatment regimen for about a year, and it appears to no longer be controlling the cancer. The patient is otherwise functioning well and has no other complaints.         ALLERGIES:  is allergic to sulfa antibiotics.  MEDICATIONS:  Current Outpatient Medications  Medication Sig Dispense Refill   Accu-Chek FastClix Lancets MISC USE TO CHECK FASTING BLOOD SUGAR AND BEFORE MEALS AND AGAIN IF PATIENT FEELS BAD (SYMPTOMS OF HYPO) AS DIRECTED BY YOUR PRESCRIBER 102 each 11   allopurinol (ZYLOPRIM) 100 MG tablet Take 1 tablet (100 mg  total) by mouth daily. 90 tablet 1   amLODipine (NORVASC) 10 MG tablet Take 1 tablet (10 mg total) by mouth daily. 90 tablet 1   aspirin EC 81 MG tablet Take 81 mg by mouth in the morning. Swallow whole.     atorvastatin (LIPITOR) 80 MG tablet TAKE 1 TABLET EVERY EVENING 90 tablet 3   Blood Glucose Monitoring Suppl (ACCU-CHEK GUIDE) w/Device KIT 1 each by Does not apply route as directed. 1 kit 0   carvedilol (COREG) 25 MG tablet Take 1 tablet (25 mg total) by mouth 2 (two) times daily with a meal. 180 tablet 1   cholecalciferol (VITAMIN D3) 25 MCG (1000 UNIT) tablet Take 1,000 Units by mouth daily.     ferrous sulfate 325 (65 FE) MG EC tablet Take 1 tablet (325 mg total) by mouth in the morning and at bedtime. 60 tablet 1   gabapentin (NEURONTIN) 100 MG capsule Take 1 capsule (100 mg total) by mouth 3 (three) times daily. 270 capsule 1   glucose blood (ACCU-CHEK GUIDE) test strip USE TO CHECK FASTING BLOOD SUGAR AND BEFORE MEALS AND AGAIN IF PATIENT FEELS BAD; SYMPTOMS OF HYPOGLYCEMIA 100 strip 11   letrozole (FEMARA) 2.5 MG tablet TAKE 1 TABLET BY MOUTH DAILY 90 tablet 3   Multiple Vitamin (MULTIVITAMIN WITH MINERALS) TABS tablet Take 1 tablet by mouth daily. 30 tablet 0   ondansetron (ZOFRAN) 8 MG tablet TAKE ONE TABLET BY MOUTH EVERY 8 HOURS AS NEEDED FOR NAUSEA/VOMITING 30 tablet 2   polyethylene glycol (MIRALAX / GLYCOLAX) 17 g packet Take 17 g by mouth daily as needed (constipation.).     [name]  50 MG tablet TAKE 1 TABLET BY MOUTH TWO TIMES DAILY. SWALLOW WHOLE TABLETS; DO NOT CRUSH, CHEW, OR SPLIT TABLETS BEFORE SWALLOWING. 56 tablet 5   No current facility-administered medications for this visit.    PHYSICAL EXAMINATION: ECOG PERFORMANCE STATUS: 1 - Symptomatic but completely ambulatory  Vitals:   06/13/23 1021  BP: (!) 158/50  Pulse: 67  Resp: 18  Temp: (!) 97.3 F (36.3 C)  SpO2: 100%   Filed Weights   06/13/23 1021  Weight: 158 lb 8 oz (71.9 kg)     LABORATORY DATA:   I have reviewed the data as listed    Latest Ref Rng & Units 06/13/2023   10:02 AM 03/11/2023   10:31 AM 02/04/2023    2:44 PM  CMP  Glucose 70 - 99 mg/dL 94  98  93   BUN 8 - 23 mg/dL 32  31  38   Creatinine 0.44 - 1.00 mg/dL 2.95  6.21  3.08   Sodium 135 - 145 mmol/L 140  135  134   Potassium 3.5 - 5.1 mmol/L 4.8  4.5  4.7   Chloride 98 - 111 mmol/L 106  103  101   CO2 22 - 32 mmol/L 30  26  24    Calcium 8.9 - 10.3 mg/dL 65.7  9.5  84.6   Total Protein 6.5 - 8.1 g/dL 7.7  7.3  8.1   Total Bilirubin <1.2 mg/dL 0.4  0.3  0.3   Alkaline Phos 38 - 126 U/L 88  63  81   AST 15 - 41 U/L 22  16  18    ALT 0 - 44 U/L 17  14  14      Lab Results  Component Value Date   WBC 3.2 (L) 03/11/2023   HGB 8.7 (L) 03/11/2023   HCT 26.9 (L) 03/11/2023   MCV 81.8 03/11/2023   PLT 213 03/11/2023   NEUTROABS 1.7 03/11/2023    ASSESSMENT & PLAN:  Primary malignant neoplasm of breast with metastasis (HCC) 09/14/2021:Large neglected left breast cancer for several years.  Hospitalization 09/02/2021-09/08/2021: Shortness of breath, large pleural effusion, thoracentesis, pleural fluid cytology: Metastatic adenocarcinoma breast origin ER 40%, PR 30%, Ki-67 5%, HER2 2+ by IHC, FISH negative ratio 1.43    Current treatment: [name] with letrozole (now reducing to 50 mg p.o. twice daily on 03/15/2022)   Toxicities: Constipation alternating with diarrhea. 2. fatigue has been manageable 3.  Diminished appetite 4.  Shingles: Received Valtrex 01/21/2023    CT chest 11/30/2022: Interval decrease in size of left axillary and left internal mammary chain lymph nodes.  Left upper lobe subpleural nodule minimally decreased in size, scattered tiny left-sided Peri- fissural and subpleural nodules are similar.  Decrease in the left pleural effusion.  Metastases to the sternum as before  CT chest 06/12/2023: Slight progression of pulmonary metastatic disease with new hepatic static disease measuring 4.8 cm.  Stable bone  metastases  Recommendation: Participate in [place] 1 clinical trial vs Faslodex + med based on Guardant 360 [id number]  Follow-up in 2 to 3 weeks to finalize a treatment plan   No orders of the defined types were placed in this encounter.  The patient has a good understanding of the overall plan. she agrees with it. she will call with any problems that may develop before the next visit here. Total time spent: 30 mins including face to face time and time spent for planning, charting and co-ordination of care   Tamsen Meek, MD 06/13/23

## 2023-06-13 NOTE — Assessment & Plan Note (Signed)
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  Hospitalization 09/02/2021-09/08/2021: Shortness of breath, large pleural effusion, thoracentesis, pleural fluid cytology: Metastatic adenocarcinoma breast origin ER 40%, PR 30%, Ki-67 5%, HER2 2+ by IHC, FISH negative ratio 1.43    Current treatment: [name] with letrozole (now reducing to 50 mg p.o. twice daily on 03/15/2022)   Toxicities: Constipation alternating with diarrhea. 2. fatigue has been manageable 3.  Diminished appetite 4.  Shingles: Received Valtrex 01/21/2023    CT chest 11/30/2022: Interval decrease in size of left axillary and left internal mammary chain lymph nodes.  Left upper lobe subpleural nodule minimally decreased in size, scattered tiny left-sided Peri- fissural and subpleural nodules are similar.  Decrease in the left pleural effusion.  Metastases to the sternum as before  CT chest 06/12/2023: Slight progression of pulmonary metastatic disease with new hepatic static disease measuring 4.8 cm.  Stable bone metastases  Recommendation: Participate in [place] 1 clinical trial [employer]

## 2023-06-13 NOTE — Research (Signed)
Trial:  A Phase 3, Open-Label, Randomized, Two-Part Study Comparing Gedatolisib in Combination with Palbociclib and Fulvestrant to Standard-of-Care Therapies in Patients with HR-Positive, HER2-Negative Advanced Breast Cancer Previously Treated with a CDK4/6 Inhibitor in Combination with Non-Steroidal Aromatase Inhibitor Therapy [place] Greek)   Patient Heather Rogers was identified by Dr. Pamelia Hoit as a potential candidate for the above listed study.  This Clinical Research Nurse met with NOAH RINIER, [id number], on 06/13/23 in a manner and location that ensures patient privacy to discuss participation in the above listed research study.  Patient is Accompanied by daughter [name] .  A copy of the informed consent document with embedded HIPAA language was provided to the patient.  Patient reads, speaks, and understands [place].   Patient was provided with the business card of this Nurse and encouraged to contact the research team with any questions.  Approximately 25 minutes were spent with the patient reviewing the informed consent documents.  Patient was provided the option of taking informed consent documents home to review and was encouraged to review at their convenience with their support network, including other care providers. Patient took the consent documents home to review. Plan made to follow up with daughter via phone on Tuesday 06-18-23. Confirmed phone number [phone].   Margret Chance Crescencio Jozwiak, RN, BSN, [facility] She  Her  Hers Clinical Research Nurse [facility] Direct Dial [phone] 06/13/2023 11:48 AM

## 2023-06-13 NOTE — Telephone Encounter (Signed)
A Phase 3, Open-Label, Randomized, Two-Part Study Comparing Gedatolisib in Combination with Palbociclib and Fulvestrant to Standard-of-Care Therapies in Patients with HR-Positive, HER2-Negative Advanced Breast Cancer Previously Treated with a CDK4/6 Inhibitor in Combination with Non-Steroidal Aromatase Inhibitor Therapy Orpah Greek)   Called daughter at patient's request to let her know that patient is not eligible for the study due to renal function. No answer, left VM.   Margret Chance Lamae Fosco, RN, BSN, [facility] She  Her  Hers Clinical Research Nurse [facility] Direct Dial [phone] 06/13/2023 2:53 PM

## 2023-06-22 ENCOUNTER — Other Ambulatory Visit: Payer: Self-pay | Admitting: Hematology and Oncology

## 2023-06-25 DIAGNOSIS — J910 Malignant pleural effusion: Secondary | ICD-10-CM | POA: Diagnosis not present

## 2023-06-25 DIAGNOSIS — C50919 Malignant neoplasm of unspecified site of unspecified female breast: Secondary | ICD-10-CM | POA: Diagnosis not present

## 2023-06-26 NOTE — Telephone Encounter (Signed)
Called and left message of patient schedule appointment. Per in basket message.  Pt needing appt with Dr. Pamelia Hoit 07/04/23 at 215 pm. Please call pt to confirm appt. Please place in appt notes "review guardant 360"

## 2023-07-03 ENCOUNTER — Other Ambulatory Visit: Payer: Self-pay | Admitting: *Deleted

## 2023-07-03 DIAGNOSIS — C50919 Malignant neoplasm of unspecified site of unspecified female breast: Secondary | ICD-10-CM

## 2023-07-03 NOTE — Telephone Encounter (Signed)
Called patient no answer left message for patient and daughter. Office received call from daughter wanting to cancel appointment for 12/5.Marland Kitchen Unable to reach daughter to reschedule  appointment. Appointment cancelled per patient/daughter request due to transportation.

## 2023-07-04 ENCOUNTER — Inpatient Hospital Stay: Payer: Medicare HMO

## 2023-07-04 ENCOUNTER — Inpatient Hospital Stay: Payer: Medicare HMO | Admitting: Hematology and Oncology

## 2023-07-09 ENCOUNTER — Encounter: Payer: Self-pay | Admitting: Hematology and Oncology

## 2023-07-11 LAB — GUARDANT 360

## 2023-08-22 ENCOUNTER — Ambulatory Visit: Payer: Medicare HMO | Attending: Family Medicine | Admitting: Family Medicine

## 2023-08-22 VITALS — BP 168/72 | HR 64 | Ht 62.0 in | Wt 161.8 lb

## 2023-08-22 DIAGNOSIS — Z7984 Long term (current) use of oral hypoglycemic drugs: Secondary | ICD-10-CM

## 2023-08-22 DIAGNOSIS — E1159 Type 2 diabetes mellitus with other circulatory complications: Secondary | ICD-10-CM

## 2023-08-22 DIAGNOSIS — I10 Essential (primary) hypertension: Secondary | ICD-10-CM

## 2023-08-22 DIAGNOSIS — C50919 Malignant neoplasm of unspecified site of unspecified female breast: Secondary | ICD-10-CM | POA: Diagnosis not present

## 2023-08-22 DIAGNOSIS — E1141 Type 2 diabetes mellitus with diabetic mononeuropathy: Secondary | ICD-10-CM

## 2023-08-22 DIAGNOSIS — E041 Nontoxic single thyroid nodule: Secondary | ICD-10-CM | POA: Diagnosis not present

## 2023-08-22 LAB — POCT GLYCOSYLATED HEMOGLOBIN (HGB A1C): HbA1c, POC (controlled diabetic range): 6.7 % (ref 0.0–7.0)

## 2023-08-22 MED ORDER — METFORMIN HCL 500 MG PO TABS: 500.0000 mg | ORAL_TABLET | Freq: Every day | ORAL | 1 refills | Status: DC

## 2023-08-22 NOTE — Progress Notes (Signed)
Subjective:  Patient ID: Heather Rogers, female    DOB: 1945-11-30  Age: 78 y.o. MRN: [id number]  CC: Medical Management of Chronic Issues   HPI Heather Rogers is a 78 y.o. year old female with a history of  type 2 diabetes mellitus (A1c 6.7), hypertension, gout, history of CVA, status post loop recorder placement in 2019, metastatic L breast cancer (pulmonary and bony metastasis), thyroid nodule. She is accompanied by her daughter, [name] to today's visit.  Interval History: Discussed the use of AI scribe software for clinical note transcription with the patient, who gave verbal consent to proceed.  The patient's daughter reports that the patient has been gaining weight, with an increase of approximately 15 pounds over the past five months. The patient admits to increased food intake and a sedentary lifestyle, often eating and then lying down. The patient's A1c has also increased to 6.7, indicating a worsening of her diabetes control. The patient's daughter reports that the patient is often sleepy taking of 3 naps a day.  The patient also has a history of high blood pressure, which was slightly elevated at the start of the visit but is reported to usually normalize later in the day. The patient's blood pressure readings at home have been within normal limits.  She endorses adherence with her antihypertensive.  Recent tests have shown that there is progression in her metastatic breast cancer with initial pulmonary metastasis, stable osseous metastasis with new hepatic metastasis. The patient's current cancer medication is reportedly no longer effective, and the patient is awaiting further consultation with her oncologist to discuss the next steps in her treatment.  The patient also has a 1.8 cm thyroid nodule, which is being monitored with yearly check-ups. The patient's gout has been stable with no recent flare-ups.        Past Medical History:  Diagnosis Date   Chronic kidney  disease, stage 3b (HCC)    Diabetes (HCC)    Hypertension    Pneumonia due to COVID-19 virus 07/25/2020   Primary malignant neoplasm of breast with metastasis (HCC) 09/14/2021   Stroke (cerebrum) (HCC)    Stroke (HCC) 05/15/2018    Past Surgical History:  Procedure Laterality Date   CHEST TUBE INSERTION Left 09/07/2021   Procedure: CHEST TUBE INSERTION;  Surgeon: Lorin Glass, MD;  Location: [facility] ENDOSCOPY;  Service: Pulmonary;  Laterality: Left;   CHEST TUBE INSERTION N/A 03/19/2022   Procedure: REMOVAL PLEURAL DRAINAGE CATHETER;  Surgeon: Omar Person, MD;  Location: [facility] ENDOSCOPY;  Service: Pulmonary;  Laterality: N/A;   EYE SURGERY Left 04/06/2020   Implant placed   EYE SURGERY Right 03/30/2020   Implant placed    hysterectomy     LOOP RECORDER INSERTION N/A 05/20/2018   Procedure: LOOP RECORDER INSERTION;  Surgeon: Hillis Range, MD;  Location: [facility] INVASIVE CV LAB;  Service: Cardiovascular;  Laterality: N/A;   TEE WITHOUT CARDIOVERSION N/A 05/20/2018   Procedure: TRANSESOPHAGEAL ECHOCARDIOGRAM (TEE);  Surgeon: Quintella Reichert, MD;  Location[facility] ENDOSCOPY;  Service: Cardiovascular;  Laterality: N/A;    Family History  Problem Relation Age of Onset   Hypertension Mother    Stroke Mother    Hypertension Father     Social History   Socioeconomic History   Marital status: Divorced    Spouse name: Not on file   Number of children: Not on file   Years of education: Not on file   Highest education level: 12th grade  Occupational History  Not on file  Tobacco Use   Smoking status: Former    Current packs/day: 0.00    Average packs/day: 1 pack/day for 15.0 years (15.0 ttl pk-yrs)    Types: Cigarettes    Start date: 07/30/1970    Quit date: 07/30/1985    Years since quitting: 38.0   Smokeless tobacco: Never  Vaping Use   Vaping status: Never Used  Substance and Sexual Activity   Alcohol use: Never   Drug use: Never   Sexual activity: Not Currently  Other Topics  Concern   Not on file  Social History Narrative   Lives in [place]; relies on daughter for transportation; grandson lives with her    Social Drivers of Health   Financial Resource Strain: Low Risk  (08/22/2023)   Overall Financial Resource Strain (CARDIA)    Difficulty of Paying Living Expenses: Not hard at all  Food Insecurity: No Food Insecurity (08/22/2023)   Hunger Vital Sign    Worried About Running Out of Food in the Last Year: Never true    Ran Out of Food in the Last Year: Never true  Transportation Needs: No Transportation Needs (08/22/2023)   PRAPARE - Administrator, Civil Service (Medical): No    Lack of Transportation (Non-Medical): No  Physical Activity: Inactive (08/22/2023)   Exercise Vital Sign    Days of Exercise per Week: 0 days    Minutes of Exercise per Session: 0 min  Stress: No Stress Concern Present (08/22/2023)   [employer] of Occupational Health - Occupational Stress Questionnaire    Feeling of Stress : Not at all  Social Connections: Moderately Integrated (08/22/2023)   Social Connection and Isolation Panel [NHANES]    Frequency of Communication with Friends and Family: More than three times a week    Frequency of Social Gatherings with Friends and Family: Once a week    Attends Religious Services: More than 4 times per year    Active Member of [employer] or Organizations: Yes    Attends Engineer, structural: More than 4 times per year    Marital Status: Divorced    Allergies  Allergen Reactions   Sulfa Antibiotics Itching    Outpatient Medications Prior to Visit  Medication Sig Dispense Refill   Accu-Chek FastClix Lancets MISC USE TO CHECK FASTING BLOOD SUGAR AND BEFORE MEALS AND AGAIN IF PATIENT FEELS BAD (SYMPTOMS OF HYPO) AS DIRECTED BY YOUR PRESCRIBER 102 each 11   allopurinol (ZYLOPRIM) 100 MG tablet Take 1 tablet (100 mg total) by mouth daily. 90 tablet 1   amLODipine (NORVASC) 10 MG tablet Take 1 tablet (10 mg total) by  mouth daily. 90 tablet 1   aspirin EC 81 MG tablet Take 81 mg by mouth in the morning. Swallow whole.     atorvastatin (LIPITOR) 80 MG tablet TAKE 1 TABLET EVERY EVENING 90 tablet 3   Blood Glucose Monitoring Suppl (ACCU-CHEK GUIDE) w/Device KIT 1 each by Does not apply route as directed. 1 kit 0   carvedilol (COREG) 25 MG tablet Take 1 tablet (25 mg total) by mouth 2 (two) times daily with a meal. 180 tablet 1   cholecalciferol (VITAMIN D3) 25 MCG (1000 UNIT) tablet Take 1,000 Units by mouth daily.     ferrous sulfate 325 (65 FE) MG EC tablet Take 1 tablet (325 mg total) by mouth in the morning and at bedtime. 60 tablet 1   gabapentin (NEURONTIN) 100 MG capsule Take 1 capsule (100 mg total) by  mouth 3 (three) times daily. 270 capsule 1   glucose blood (ACCU-CHEK GUIDE) test strip USE TO CHECK FASTING BLOOD SUGAR AND BEFORE MEALS AND AGAIN IF PATIENT FEELS BAD; SYMPTOMS OF HYPOGLYCEMIA 100 strip 11   letrozole (FEMARA) 2.5 MG tablet TAKE 1 TABLET BY MOUTH DAILY 90 tablet 3   Multiple Vitamin (MULTIVITAMIN WITH MINERALS) TABS tablet Take 1 tablet by mouth daily. 30 tablet 0   ondansetron (ZOFRAN) 8 MG tablet TAKE 1 TABLET BY MOUTH EVERY 8 HOURS AS NEEDED FOR NAUSEA AND/OR VOMITING 30 tablet 2   polyethylene glycol (MIRALAX / GLYCOLAX) 17 g packet Take 17 g by mouth daily as needed (constipation.).     [name] 50 MG tablet TAKE 1 TABLET BY MOUTH TWO TIMES DAILY. SWALLOW WHOLE TABLETS; DO NOT CRUSH, CHEW, OR SPLIT TABLETS BEFORE SWALLOWING. 56 tablet 5   No facility-administered medications prior to visit.     ROS Review of Systems  Constitutional:  Negative for activity change and appetite change.  HENT:  Negative for sinus pressure and sore throat.   Respiratory:  Negative for chest tightness, shortness of breath and wheezing.   Cardiovascular:  Negative for chest pain and palpitations.  Gastrointestinal:  Negative for abdominal distention, abdominal pain and constipation.  Genitourinary:  Negative.   Musculoskeletal: Negative.   Psychiatric/Behavioral:  Negative for behavioral problems and dysphoric mood.     Objective:  BP (!) 168/72   Pulse 64   Ht 5\' 2"  (1.575 m)   Wt 161 lb 12.8 oz (73.4 kg)   SpO2 98%   BMI 29.59 kg/m      08/22/2023   10:56 AM 06/13/2023   10:21 AM 05/22/2023   10:41 AM  BP/Weight  Systolic BP 168 158 124  Diastolic BP 72 50 66  Wt. (Lbs) 161.8 158.5   BMI 29.59 kg/m2 28.99 kg/m2     Wt Readings from Last 3 Encounters:  08/22/23 161 lb 12.8 oz (73.4 kg)  06/13/23 158 lb 8 oz (71.9 kg)  05/22/23 157 lb 6.4 oz (71.4 kg)     Physical Exam Constitutional:      Appearance: She is well-developed.  Cardiovascular:     Rate and Rhythm: Normal rate.     Heart sounds: Normal heart sounds. No murmur heard. Pulmonary:     Effort: Pulmonary effort is normal.     Breath sounds: Normal breath sounds. No wheezing or rales.  Chest:     Chest wall: No tenderness.  Abdominal:     General: Bowel sounds are normal. There is no distension.     Palpations: Abdomen is soft. There is no mass.     Tenderness: There is no abdominal tenderness.  Musculoskeletal:        General: Normal range of motion.     Right lower leg: No edema.     Left lower leg: No edema.  Neurological:     Mental Status: She is alert and oriented to person, place, and time.  Psychiatric:        Mood and Affect: Mood normal.        Latest Ref Rng & Units 06/13/2023   10:02 AM 03/11/2023   10:31 AM 02/04/2023    2:44 PM  CMP  Glucose 70 - 99 mg/dL 94  98  93   BUN 8 - 23 mg/dL 32  31  38   Creatinine 0.44 - 1.00 mg/dL 1.47  8.29  5.62   Sodium 135 - 145 mmol/L 140  135  134   Potassium 3.5 - 5.1 mmol/L 4.8  4.5  4.7   Chloride 98 - 111 mmol/L 106  103  101   CO2 22 - 32 mmol/L 30  26  24    Calcium 8.9 - 10.3 mg/dL 84.6  9.5  96.2   Total Protein 6.5 - 8.1 g/dL 7.7  7.3  8.1   Total Bilirubin <1.2 mg/dL 0.4  0.3  0.3   Alkaline Phos 38 - 126 U/L 88  63  81   AST  15 - 41 U/L 22  16  18    ALT 0 - 44 U/L 17  14  14      Lipid Panel     Component Value Date/Time   CHOL 115 05/24/2021 1003   TRIG 105 05/24/2021 1003   HDL 38 (L) 05/24/2021 1003   CHOLHDL 3.0 05/24/2021 1003   CHOLHDL 4.7 05/16/2018 0420   VLDL 35 05/16/2018 0420   LDLCALC 57 05/24/2021 1003    CBC    Component Value Date/Time   WBC 3.6 (L) 06/13/2023 1151   WBC 5.8 03/01/2022 0319   RBC 4.00 06/13/2023 1151   HGB 10.9 (L) 06/13/2023 1151   HGB 10.3 (L) 05/24/2021 1003   HCT 33.6 (L) 06/13/2023 1151   HCT 32.4 (L) 05/24/2021 1003   PLT 189 06/13/2023 1151   PLT 345 05/24/2021 1003   MCV 84.0 06/13/2023 1151   MCV 72 (L) 05/24/2021 1003   MCH 27.3 06/13/2023 1151   MCHC 32.4 06/13/2023 1151   RDW 13.5 06/13/2023 1151   RDW 14.4 05/24/2021 1003   LYMPHSABS 1.1 06/13/2023 1151   LYMPHSABS 1.6 05/24/2021 1003   MONOABS 0.3 06/13/2023 1151   EOSABS 0.1 06/13/2023 1151   EOSABS 0.1 05/24/2021 1003   BASOSABS 0.0 06/13/2023 1151   BASOSABS 0.0 05/24/2021 1003    Lab Results  Component Value Date   HGBA1C 6.7 08/22/2023    Assessment & Plan:      Weight Gain and Type 2 DM Noted 15-pound weight gain over the past 5 months and an increase in A1c to 6.7. Likely related to increased food intake and sedentary behavior. -Resume Metformin once daily. -Encourage dietary modifications and increased physical activity.  Hypertension Blood pressure elevated in office but home readings are within normal range. Currently on Amlodipine, Coreg. -Continue current medication regimen. -Monitor blood pressure at home and report if readings start to increase.  Breast Cancer with pulmonary, hepatic and osseous metastasis Noted progression of disease with new liver involvement. Current treatment deemed ineffective and patient does not meet criteria for trial medication. -Await further management plan from oncologist on 09/11/2023.  Thyroid Nodule Stable 1.8 cm nodule on thyroid,  currently under yearly surveillance. -Continue yearly monitoring.  Bone Disease Stable, awaiting potential new treatment pending dental clearance. -Continue current management and await further instructions from oncologist.  General Health Maintenance -Complete eye exam scheduled for 09/25/2023. -Complete blood work on 09/11/2023. -Consider cholesterol check on 09/11/2023 if fasting. -Consider discontinuing Gabapentin if symptoms of numbness and pins/needles have resolved since she has complained of daytime somnolence.          Meds ordered this encounter  Medications   metFORMIN (GLUCOPHAGE) 500 MG tablet    Sig: Take 1 tablet (500 mg total) by mouth daily with breakfast.    Dispense:  90 tablet    Refill:  1    Follow-up: Return in about 3 months (around 11/20/2023) for Chronic medical conditions.  [name], MD, FAAFP. [facility] [place], [place] [phone]   08/22/2023, 11:59 AM

## 2023-08-22 NOTE — Patient Instructions (Signed)
VISIT SUMMARY:  Today, you came in for a routine follow-up visit with your daughter. We discussed your recent weight gain, increased A1c levels, and your current medications. We also reviewed your blood pressure, cancer treatment, thyroid nodule, and general health maintenance.  YOUR PLAN:  -WEIGHT GAIN AND PREDIABETES: You have gained 15 pounds over the past five months, and your A1c level has increased to 6.7, indicating that your blood sugar control has worsened. We will resume Metformin once daily to help manage your blood sugar. Additionally, it is important to make dietary changes and increase your physical activity.  -HYPERTENSION: Your blood pressure was slightly elevated in the office, but your home readings are normal. Continue taking your current medication, Amlodipine, and keep monitoring your blood pressure at home. Let us know if your readings start to increase.  -BREAST CANCER WITH METASTASIS TO LIVER: Your cancer has spread to your liver, and your current treatment is no longer effective. We are waiting for further instructions from your oncologist, and you have an appointment with them on 09/11/2023.  -THYROID NODULE: You have a stable nodule on your thyroid that we are monitoring yearly. Continue with your yearly check-ups.  -BONE DISEASE: Your bone disease is stable, and we are waiting for potential new treatment pending dental clearance. Continue with your current management plan and await further instructions from your oncologist.  -GENERAL HEALTH MAINTENANCE: You have an eye exam scheduled for 09/25/2023 and blood work on 09/11/2023. We may also check your cholesterol on 09/11/2023 if you are fasting. Consider stopping Gabapentin if your numbness and pins/needles symptoms have resolved.  INSTRUCTIONS:  Please follow up with your oncologist on 09/11/2023 for further management of your cancer. Additionally, complete your eye exam on 09/25/2023 and your blood work on 09/11/2023. If  you are fasting, we will also check your cholesterol levels on 09/11/2023. Continue monitoring your blood pressure at home and report any increases.

## 2023-08-24 LAB — MICROALBUMIN / CREATININE URINE RATIO
Creatinine, Urine: 187.9 mg/dL
Microalb/Creat Ratio: 125 mg/g{creat} — ABNORMAL HIGH (ref 0–29)
Microalbumin, Urine: 234.1 ug/mL

## 2023-08-26 ENCOUNTER — Encounter: Payer: Self-pay | Admitting: Family Medicine

## 2023-09-10 ENCOUNTER — Other Ambulatory Visit: Payer: Self-pay | Admitting: *Deleted

## 2023-09-10 ENCOUNTER — Telehealth: Payer: Self-pay | Admitting: Hematology and Oncology

## 2023-09-10 DIAGNOSIS — C50919 Malignant neoplasm of unspecified site of unspecified female breast: Secondary | ICD-10-CM

## 2023-09-10 NOTE — Progress Notes (Signed)
Verbal orders received from MD for pt to receive CT CAP.  Orders paced and scheduled. Appt will be given to pt during visit tomorrow.

## 2023-09-10 NOTE — Telephone Encounter (Signed)
Canceled and scheduled appointments per 2/11 secure chat. Talked with the patients daughter [name] and she is aware of the appointment changes.

## 2023-09-11 ENCOUNTER — Inpatient Hospital Stay: Payer: Medicare HMO

## 2023-09-11 ENCOUNTER — Other Ambulatory Visit: Payer: Self-pay | Admitting: *Deleted

## 2023-09-11 ENCOUNTER — Inpatient Hospital Stay: Payer: Medicare HMO | Admitting: Pharmacist

## 2023-09-11 DIAGNOSIS — C50919 Malignant neoplasm of unspecified site of unspecified female breast: Secondary | ICD-10-CM

## 2023-09-20 ENCOUNTER — Emergency Department (HOSPITAL_COMMUNITY): Payer: Medicare HMO

## 2023-09-20 ENCOUNTER — Emergency Department (HOSPITAL_COMMUNITY)
Admission: EM | Admit: 2023-09-20 | Discharge: 2023-09-20 | Disposition: A | Discharge status: Home / Self Care | Payer: Medicare HMO | Attending: Emergency Medicine | Admitting: Emergency Medicine

## 2023-09-20 ENCOUNTER — Other Ambulatory Visit: Payer: Self-pay

## 2023-09-20 DIAGNOSIS — I672 Cerebral atherosclerosis: Secondary | ICD-10-CM | POA: Diagnosis not present

## 2023-09-20 DIAGNOSIS — R4182 Altered mental status, unspecified: Secondary | ICD-10-CM | POA: Diagnosis not present

## 2023-09-20 DIAGNOSIS — I6782 Cerebral ischemia: Secondary | ICD-10-CM | POA: Diagnosis not present

## 2023-09-20 DIAGNOSIS — R531 Weakness: Secondary | ICD-10-CM | POA: Diagnosis not present

## 2023-09-20 DIAGNOSIS — R0602 Shortness of breath: Secondary | ICD-10-CM | POA: Insufficient documentation

## 2023-09-20 DIAGNOSIS — I700 Atherosclerosis of aorta: Secondary | ICD-10-CM | POA: Diagnosis not present

## 2023-09-20 DIAGNOSIS — R109 Unspecified abdominal pain: Secondary | ICD-10-CM | POA: Diagnosis not present

## 2023-09-20 DIAGNOSIS — C7951 Secondary malignant neoplasm of bone: Secondary | ICD-10-CM | POA: Diagnosis not present

## 2023-09-20 DIAGNOSIS — C787 Secondary malignant neoplasm of liver and intrahepatic bile duct: Secondary | ICD-10-CM | POA: Insufficient documentation

## 2023-09-20 DIAGNOSIS — J910 Malignant pleural effusion: Secondary | ICD-10-CM | POA: Diagnosis not present

## 2023-09-20 DIAGNOSIS — C50919 Malignant neoplasm of unspecified site of unspecified female breast: Secondary | ICD-10-CM | POA: Diagnosis not present

## 2023-09-20 DIAGNOSIS — C3490 Malignant neoplasm of unspecified part of unspecified bronchus or lung: Secondary | ICD-10-CM | POA: Diagnosis not present

## 2023-09-20 LAB — CBC WITH DIFFERENTIAL/PLATELET
Abs Immature Granulocytes: 0.03 10*3/uL (ref 0.00–0.07)
Basophils Absolute: 0.1 10*3/uL (ref 0.0–0.1)
Basophils Relative: 1 %
Eosinophils Absolute: 0.1 10*3/uL (ref 0.0–0.5)
Eosinophils Relative: 1 %
HCT: 30.2 % — ABNORMAL LOW (ref 36.0–46.0)
Hemoglobin: 9.8 g/dL — ABNORMAL LOW (ref 12.0–15.0)
Immature Granulocytes: 1 %
Lymphocytes Relative: 20 %
Lymphs Abs: 1.2 10*3/uL (ref 0.7–4.0)
MCH: 27.1 pg (ref 26.0–34.0)
MCHC: 32.5 g/dL (ref 30.0–36.0)
MCV: 83.7 fL (ref 80.0–100.0)
Monocytes Absolute: 1 10*3/uL (ref 0.1–1.0)
Monocytes Relative: 16 %
Neutro Abs: 3.8 10*3/uL (ref 1.7–7.7)
Neutrophils Relative %: 61 %
Platelets: 220 10*3/uL (ref 150–400)
RBC: 3.61 MIL/uL — ABNORMAL LOW (ref 3.87–5.11)
RDW: 14.5 % (ref 11.5–15.5)
WBC: 6.1 10*3/uL (ref 4.0–10.5)
nRBC: 0 % (ref 0.0–0.2)

## 2023-09-20 LAB — RESP PANEL BY RT-PCR (RSV, FLU A&B, COVID)  RVPGX2
Influenza A by PCR: NEGATIVE
Influenza B by PCR: NEGATIVE
Resp Syncytial Virus by PCR: NEGATIVE
SARS Coronavirus 2 by RT PCR: NEGATIVE

## 2023-09-20 LAB — COMPREHENSIVE METABOLIC PANEL
ALT: 82 U/L — ABNORMAL HIGH (ref 0–44)
AST: 117 U/L — ABNORMAL HIGH (ref 15–41)
Albumin: 3.7 g/dL (ref 3.5–5.0)
Alkaline Phosphatase: 145 U/L — ABNORMAL HIGH (ref 38–126)
Anion gap: 11 (ref 5–15)
BUN: 27 mg/dL — ABNORMAL HIGH (ref 8–23)
CO2: 22 mmol/L (ref 22–32)
Calcium: 9.9 mg/dL (ref 8.9–10.3)
Chloride: 103 mmol/L (ref 98–111)
Creatinine, Ser: 1.49 mg/dL — ABNORMAL HIGH (ref 0.44–1.00)
GFR, Estimated: 36 mL/min — ABNORMAL LOW (ref >60)
Glucose, Bld: 141 mg/dL — ABNORMAL HIGH (ref 70–99)
Potassium: 4.6 mmol/L (ref 3.5–5.1)
Sodium: 136 mmol/L (ref 135–145)
Total Bilirubin: 1 mg/dL (ref 0.0–1.2)
Total Protein: 8.2 g/dL — ABNORMAL HIGH (ref 6.5–8.1)

## 2023-09-20 MED ORDER — IOHEXOL 350 MG/ML SOLN
80.0000 mL | Freq: Once | INTRAVENOUS | Status: AC | PRN
  Administered 2023-09-20: 80 mL via INTRAVENOUS

## 2023-09-20 NOTE — ED Provider Notes (Addendum)
Received a prearrival call from patient's oncologist.  She sees Dr. Rhae Hammock for history of breast cancer and possible mets.  They were planning for CT of the chest abdomen pelvis with contrast for further evaluation of possible metastatic disease.  They saw that she was coming in for evaluation of shortness of breath and general decline and was hoping that this imaging may be completed during her visit if appropriate.    Rozelle Logan, DO 09/20/23 1429

## 2023-09-20 NOTE — ED Provider Notes (Signed)
[place] EMERGENCY DEPARTMENT AT [facility] Provider Note   CSN: [id number] Arrival date & time: 09/20/23  1335     History Chief Complaint  Patient presents with   Shortness of Breath   Extremity Weakness    HPI Heather Rogers is a 78 y.o. female presenting for fatigue weakness and SOB. This has been progressive over the past few weeks.  Sent in by oncology for CTA PE and CT AB/Pel for concern for new metastatic disease. Family is at bedside, they endorse no acute complaints.  Just more so failure to thrive syndrome over the past few weeks and months.  Patient's recorded medical, surgical, social, medication list and allergies were reviewed in the Snapshot window as part of the initial history.   Review of Systems   Review of Systems  Constitutional:  Positive for fatigue. Negative for chills and fever.  HENT:  Negative for ear pain and sore throat.   Eyes:  Negative for pain and visual disturbance.  Respiratory:  Positive for shortness of breath. Negative for cough.   Cardiovascular:  Negative for chest pain and palpitations.  Gastrointestinal:  Positive for nausea. Negative for abdominal pain, rectal pain and vomiting.  Genitourinary:  Negative for dysuria and hematuria.  Musculoskeletal:  Negative for arthralgias and back pain.  Skin:  Negative for color change and rash.  Neurological:  Positive for weakness. Negative for seizures and syncope.  All other systems reviewed and are negative.   Physical Exam Updated Vital Signs BP (!) 152/63   Pulse 78   Temp 98.5 F (36.9 C) (Oral)   Resp 19   Ht 5\' 2"  (1.575 m)   Wt 72.6 kg   SpO2 98%   BMI 29.26 kg/m  Physical Exam Constitutional:      General: She is not in acute distress.    Appearance: She is not ill-appearing or toxic-appearing.  HENT:     Head: Normocephalic and atraumatic.  Eyes:     Extraocular Movements: Extraocular movements intact.     Pupils: Pupils are equal, round, and  reactive to light.  Cardiovascular:     Rate and Rhythm: Normal rate.  Pulmonary:     Effort: No respiratory distress.  Abdominal:     General: Abdomen is flat.  Musculoskeletal:        General: No swelling, deformity or signs of injury.     Cervical back: Normal range of motion. No rigidity.  Skin:    General: Skin is warm and dry.  Neurological:     General: No focal deficit present.     Mental Status: She is alert and oriented to person, place, and time.  Psychiatric:        Mood and Affect: Mood normal.      ED Course/ Medical Decision Making/ A&P    Procedures Procedures   Medications Ordered in ED Medications  iohexol (OMNIPAQUE) 350 MG/ML injection 80 mL (80 mLs Intravenous Contrast Given 09/20/23 1845)    Medical Decision Making:    Heather Rogers is a 78 y.o. female who presented to the ED today with myriad of symptoms detailed above.     Additional history discussed with patient's family/caregivers.  Patient placed on continuous vitals and telemetry monitoring while in ED which was reviewed periodically.   Complete initial physical exam performed, notably the patient  was hemodynamically stable no acute distress.      Reviewed and confirmed nursing documentation for past medical history, family history, social history.  Initial Assessment:   This is most consistent with an acute life/limb threatening illness complicated by underlying chronic conditions. Patient has no acute complaints on my exam.  Some ongoing dyspnea and shortness of breath. Her history of present illness physical exam findings are most consistent with likely metabolic demand from her ongoing malignancy causing chronic fatigue syndrome. However given her history of active malignancy and reported shortness of breath worsening from her baseline, concern for pulmonary edema versus pulmonary embolism Heather Rogers in the differential.  Considered pneumothorax pneumonia is less likely based on lack  of fever delayed acuity of symptoms. Received message from her oncologist that there is concern for worsening metastatic lesions as a possible etiology and recommended CT abdomen pelvis follow-up for his well. In triage, patient endorsed leg numbness, will evaluate for intracranial emergency including hemorrhage or metastatic disease with CT head as well.  Initial Study Results:   Laboratory  All laboratory results reviewed without evidence of clinically relevant pathology.     EKG EKG was reviewed independently. Rate, rhythm, axis, intervals all examined and without medically relevant abnormality. ST segments without concerns for elevations.    Radiology  All images reviewed independently. Agree with radiology report at this time.   CT Angio Chest Pulmonary Embolism (PE) W or WO Contrast Result Date: 09/20/2023 CLINICAL DATA:  Metastatic breast cancer EXAM: CT ANGIOGRAPHY CHEST CT ABDOMEN AND PELVIS WITH CONTRAST TECHNIQUE: Multidetector CT imaging of the chest was performed using the standard protocol during bolus administration of intravenous contrast. Multiplanar CT image reconstructions and MIPs were obtained to evaluate the vascular anatomy. Multidetector CT imaging of the abdomen and pelvis was performed using the standard protocol during bolus administration of intravenous contrast. RADIATION DOSE REDUCTION: This exam was performed according to the departmental dose-optimization program which includes automated exposure control, adjustment of the mA and/or kV according to patient size and/or use of iterative reconstruction technique. CONTRAST:  80mL OMNIPAQUE IOHEXOL 350 MG/ML SOLN COMPARISON:  CT chest dated 06/05/2023. CT abdomen/pelvis dated 02/18/2022. FINDINGS: CTA CHEST FINDINGS Cardiovascular: Satisfactory opacification the bilateral pulmonary arteries to the segmental level. No evidence for movement. Although not tailored for evaluation of the thoracic aorta, there is no evidence of  thoracic aortic aneurysm or dissection. Atherosclerotic calcifications of the arch. Mild cardiomegaly.  No pericardial effusion. Mild three-vessel coronary atherosclerosis. Mediastinum/Nodes: Small mediastinal nodes, including an 8 mm short axis prevascular node (series 508/image 30) and a 7 mm short axis subcarinal node (series 508/image 53). Stable right thyroid nodule, better evaluated on prior thyroid ultrasound. Lungs/Pleura: Small left pleural effusion with pleural-based nodularity, mildly progressive from the prior, including a 1.8 cm left basilar nodule (series 508/image 102) which previously measured 16 mm. Right lung is clear. No pneumothorax. Musculoskeletal: Lytic/sclerotic metastasis involving the sternum, grossly unchanged. No new/suspicious lesions. Degenerative changes of the thoracic spine. Review of the MIP images confirms the above findings. CT ABDOMEN and PELVIS FINDINGS Hepatobiliary: Numerous hepatic metastases in both lobes, progressive. Dominant 7.4 cm mass the junction of segment 4B and 5 (series 25/image 23), previously 5.0 cm. Gallbladder is decompressed with a tiny layering gallstone (series 25/image 29). No intrahepatic or extrahepatic ductal dilatation. Pancreas: Within normal limits. Spleen: Within normal limits. Adrenals/Urinary Tract: Adrenal glands are within normal limits. Numerous bilateral renal cysts, measuring up to 1.7 cm in the anterior left upper kidney (series 25/image 24), benign (Bosniak I). No follow-up is recommended. No hydronephrosis. Bladder is within normal limits. Stomach/Bowel: Stomach is notable for a small hiatal hernia. No evidence  of bowel obstruction. Normal appendix (series 25/image 54). No colonic wall thickening or inflammatory changes. Vascular/Lymphatic: No evidence of abdominal aortic aneurysm. Atherosclerotic calcifications of the abdominal aorta and branch vessels, although vessels remain patent. Small upper abdominal nodes, including a 10 mm short  axis gastrohepatic node (series 25/image 16). Reproductive: Status post hysterectomy. Bilateral ovaries are within normal limits. Other: Small volume pelvic ascites Musculoskeletal: No focal osseous lesions. Review of the MIP images confirms the above findings. IMPRESSION: No evidence of pulmonary embolism. Small left pleural effusion malignant pleural-based nodularity, mildly progressive. Multifocal hepatic metastases, progressive. Additional ancillary findings as above. Electronically Signed   By: Charline Bills M.D.   On: 09/20/2023 20:02   CT ABDOMEN PELVIS W CONTRAST Result Date: 09/20/2023 CLINICAL DATA:  Metastatic breast cancer EXAM: CT ANGIOGRAPHY CHEST CT ABDOMEN AND PELVIS WITH CONTRAST TECHNIQUE: Multidetector CT imaging of the chest was performed using the standard protocol during bolus administration of intravenous contrast. Multiplanar CT image reconstructions and MIPs were obtained to evaluate the vascular anatomy. Multidetector CT imaging of the abdomen and pelvis was performed using the standard protocol during bolus administration of intravenous contrast. RADIATION DOSE REDUCTION: This exam was performed according to the departmental dose-optimization program which includes automated exposure control, adjustment of the mA and/or kV according to patient size and/or use of iterative reconstruction technique. CONTRAST:  80mL OMNIPAQUE IOHEXOL 350 MG/ML SOLN COMPARISON:  CT chest dated 06/05/2023. CT abdomen/pelvis dated 02/18/2022. FINDINGS: CTA CHEST FINDINGS Cardiovascular: Satisfactory opacification the bilateral pulmonary arteries to the segmental level. No evidence for movement. Although not tailored for evaluation of the thoracic aorta, there is no evidence of thoracic aortic aneurysm or dissection. Atherosclerotic calcifications of the arch. Mild cardiomegaly.  No pericardial effusion. Mild three-vessel coronary atherosclerosis. Mediastinum/Nodes: Small mediastinal nodes, including an 8  mm short axis prevascular node (series 508/image 30) and a 7 mm short axis subcarinal node (series 508/image 53). Stable right thyroid nodule, better evaluated on prior thyroid ultrasound. Lungs/Pleura: Small left pleural effusion with pleural-based nodularity, mildly progressive from the prior, including a 1.8 cm left basilar nodule (series 508/image 102) which previously measured 16 mm. Right lung is clear. No pneumothorax. Musculoskeletal: Lytic/sclerotic metastasis involving the sternum, grossly unchanged. No new/suspicious lesions. Degenerative changes of the thoracic spine. Review of the MIP images confirms the above findings. CT ABDOMEN and PELVIS FINDINGS Hepatobiliary: Numerous hepatic metastases in both lobes, progressive. Dominant 7.4 cm mass the junction of segment 4B and 5 (series 25/image 23), previously 5.0 cm. Gallbladder is decompressed with a tiny layering gallstone (series 25/image 29). No intrahepatic or extrahepatic ductal dilatation. Pancreas: Within normal limits. Spleen: Within normal limits. Adrenals/Urinary Tract: Adrenal glands are within normal limits. Numerous bilateral renal cysts, measuring up to 1.7 cm in the anterior left upper kidney (series 25/image 24), benign (Bosniak I). No follow-up is recommended. No hydronephrosis. Bladder is within normal limits. Stomach/Bowel: Stomach is notable for a small hiatal hernia. No evidence of bowel obstruction. Normal appendix (series 25/image 54). No colonic wall thickening or inflammatory changes. Vascular/Lymphatic: No evidence of abdominal aortic aneurysm. Atherosclerotic calcifications of the abdominal aorta and branch vessels, although vessels remain patent. Small upper abdominal nodes, including a 10 mm short axis gastrohepatic node (series 25/image 16). Reproductive: Status post hysterectomy. Bilateral ovaries are within normal limits. Other: Small volume pelvic ascites Musculoskeletal: No focal osseous lesions. Review of the MIP images  confirms the above findings. IMPRESSION: No evidence of pulmonary embolism. Small left pleural effusion malignant pleural-based nodularity, mildly progressive. Multifocal  hepatic metastases, progressive. Additional ancillary findings as above. Electronically Signed   By: Charline Bills M.D.   On: 09/20/2023 20:02   CT Head Wo Contrast Result Date: 09/20/2023 CLINICAL DATA:  Altered mental status, metastatic breast cancer EXAM: CT HEAD WITHOUT CONTRAST TECHNIQUE: Contiguous axial images were obtained from the base of the skull through the vertex without intravenous contrast. RADIATION DOSE REDUCTION: This exam was performed according to the departmental dose-optimization program which includes automated exposure control, adjustment of the mA and/or kV according to patient size and/or use of iterative reconstruction technique. COMPARISON:  02/18/2022 FINDINGS: Brain: No evidence of acute infarction, hemorrhage, hydrocephalus, extra-axial collection or mass lesion/mass effect. Old left PCA distribution infarct. Subcortical white matter and periventricular small vessel ischemic changes. Vascular: Intracranial atherosclerosis. Skull: Normal. Negative for fracture or focal lesion. Sinuses/Orbits: The visualized paranasal sinuses are essentially clear. The mastoid air cells are unopacified. Other: None. IMPRESSION: No acute intracranial abnormality. Old left PCA distribution infarct. Small vessel ischemic changes. Electronically Signed   By: Charline Bills M.D.   On: 09/20/2023 19:47   DG Chest Port 1 View Result Date: 09/20/2023 CLINICAL DATA:  Shortness of breath. History of stage IV breast cancer with metastases in the lung and liver. EXAM: PORTABLE CHEST 1 VIEW COMPARISON:  CT chest 11/27/2022, chest radiographs 03/16/2022 FINDINGS: Cardiac silhouette and mediastinal contours are within normal limits. Moderate atherosclerotic calcifications within the aortic arch. The lungs are clear. No pleural effusion  pneumothorax. Mild dextrocurvature of the midthoracic spine with moderate multilevel degenerative disc changes. IMPRESSION: No acute cardiopulmonary disease process. Electronically Signed   By: Neita Garnet M.D.   On: 09/20/2023 15:22   .   Reassessment and Plan:   All objective findings with no new pathology.  Patient is ambulatory tolerating p.o. intake and denies any acute symptoms.  Social work was consulted to assist with management of patient's's outpatient care management.   Disposition:  I have considered need for hospitalization, however, considering all of the above, I believe this patient is stable for discharge at this time.  Patient/family educated about specific return precautions for given chief complaint and symptoms.  Patient/family educated about follow-up with PCP.     Patient/family expressed understanding of return precautions and need for follow-up. Patient spoken to regarding all imaging and laboratory results and appropriate follow up for these results. All education provided in verbal form with additional information in written form. Time was allowed for answering of patient questions. Patient discharged.    Emergency Department Medication Summary:   Medications  iohexol (OMNIPAQUE) 350 MG/ML injection 80 mL (80 mLs Intravenous Contrast Given 09/20/23 1845)         Clinical Impression:  1. Weakness      Discharge   Final Clinical Impression(s) / ED Diagnoses Final diagnoses:  Weakness    Rx / DC Orders ED Discharge Orders     None         Glyn Ade, MD 09/20/23 2349

## 2023-09-20 NOTE — Progress Notes (Signed)
Heather Rogers presents with bilateral leg numbness, shortness of breath, fatigue and weakness related to malignant neoplasm of breast with mets to lungs requires a rollator walker. [name] has limited ability to safely navigate her home environment. The rollator walker is necessary to improve mobility and facilitate independent functioning/ ADLs.

## 2023-09-20 NOTE — Care Management (Signed)
DME order for rolling walker faxed to [employer] [facility] fax line [phone]. Received fax confirmation.

## 2023-09-20 NOTE — ED Triage Notes (Signed)
Pt arrived via POV. C/o SOB, and bilat lower leg numbness for 2x weeks.  Stage 4 cancer, w/mets in lung and liver.  Aox4

## 2023-09-20 NOTE — Care Management (Signed)
    Durable Medical Equipment  (From admission, onward)           Start     Ordered   09/20/23 1911  For home use only DME Walker rolling  Once       Question Answer Comment  Walker: With 5 Inch Wheels   Patient needs a walker to treat with the following condition Dizziness      09/20/23 1912

## 2023-09-20 NOTE — Care Management (Addendum)
Transition of Care [facility]) - Emergency Department Mini Assessment   Patient Details  Name: Heather Rogers MRN: [id number] Date of Birth: [birth year]  Transition of Care [facility]) CM/SW Contact:    Lavenia Atlas, RN Phone Number: 09/20/2023, 6:13 PM   Clinical Narrative: Received TOC consult for HH/DME. Per chart review patient currently at [facility] for shortness of breath and bilateral lower leg numbness with PMH: stage 4 malignant neoplasm of breast with mets to lung and liver. This RNCM spoke with patient and daughter Heather Rogers at bedside. 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 Patient reports she has DME: bedside commode at home, would like a walker with a seat. This RNCM explained 5 year time period for insurance to pay for walker or wheelchair, patient's daughter verbalized understanding. This RNCM offered choice for [facility] and DME. Patient's daughter Heather Rogers chose [facility] for [facility] and [facility] for DME.   Notified EDP re: need for HHPT/OT, HHA orders and DME: walker/rollator. Due to being after hours will await DME orders  to fax over to [facility] after hours team.    Transportation at discharge: family  TOC will continue to follow.  ED Mini Assessment: What brought you to the Emergency Department? : patient complaint of shortness of breath, bilateral leg numbness x2 weeks.  Barriers to Discharge: Continued Medical Work up  [employer] interventions: coordinate [employer] of departure: Brewing technologist and Communications    Patient's daughter: Heather Rogers [phone]    ,          Patient states their goals for this hospitalization and ongoing recovery are:: to feel better CMS Medicare.gov Compare Post Acute Care list provided to:: Patient Represenative (must comment) [name]) Choice offered to / list presented to : Adult Children  Admission diagnosis:  shob;leg weakness Patient Active Problem List   Diagnosis Date Noted    Chronic kidney disease, stage 3b (HCC) 02/18/2022   Hyperlipidemia associated with type 2 diabetes mellitus (HCC) 02/18/2022   Primary malignant neoplasm of breast with metastasis (HCC) 09/14/2021   Hypertrophic cardiomyopathy (HCC) 09/04/2021   Breast mass, left 09/04/2021   Cognitive decline 09/04/2021   Positive D dimer 09/03/2021   Pleural effusion 09/03/2021   Pleural effusion on left 09/02/2021   AKI (acute kidney injury) (HCC) 09/02/2021   Atherosclerotic cerebrovascular disease 05/23/2021   Anemia of chronic disease 05/23/2021   Gout 02/14/2021   Syncope 07/25/2020   Type 2 diabetes mellitus (A1c 6.4 on 2/5)  07/25/2020   Status post CVA 07/25/2020   Dyslipidemia 07/25/2020   Hypertension associated with diabetes (HCC) 07/25/2020   Coagulopathy (HCC) 07/25/2020   History of CVA 11/22/2018   PCP:  [name], MD Pharmacy:   [facility] [id number] - [address], [place] - [address] [place] [address] Phone: [phone] Fax: [phone]  [facility] - [place], [place] - [address] [place] [address] Phone: [phone] Fax: [phone]  [facility] [place], [place] - [address] [place] [address] Phone: [phone] Fax: [phone]

## 2023-09-22 DIAGNOSIS — C7800 Secondary malignant neoplasm of unspecified lung: Secondary | ICD-10-CM | POA: Diagnosis not present

## 2023-09-22 DIAGNOSIS — D631 Anemia in chronic kidney disease: Secondary | ICD-10-CM | POA: Diagnosis not present

## 2023-09-22 DIAGNOSIS — N1832 Chronic kidney disease, stage 3b: Secondary | ICD-10-CM | POA: Diagnosis not present

## 2023-09-22 DIAGNOSIS — C50912 Malignant neoplasm of unspecified site of left female breast: Secondary | ICD-10-CM | POA: Diagnosis not present

## 2023-09-22 DIAGNOSIS — E1122 Type 2 diabetes mellitus with diabetic chronic kidney disease: Secondary | ICD-10-CM | POA: Diagnosis not present

## 2023-09-22 DIAGNOSIS — C787 Secondary malignant neoplasm of liver and intrahepatic bile duct: Secondary | ICD-10-CM | POA: Diagnosis not present

## 2023-09-22 DIAGNOSIS — D630 Anemia in neoplastic disease: Secondary | ICD-10-CM | POA: Diagnosis not present

## 2023-09-22 DIAGNOSIS — I152 Hypertension secondary to endocrine disorders: Secondary | ICD-10-CM | POA: Diagnosis not present

## 2023-09-22 DIAGNOSIS — E1159 Type 2 diabetes mellitus with other circulatory complications: Secondary | ICD-10-CM | POA: Diagnosis not present

## 2023-09-24 ENCOUNTER — Encounter (HOSPITAL_COMMUNITY): Payer: Self-pay

## 2023-09-24 ENCOUNTER — Other Ambulatory Visit (HOSPITAL_COMMUNITY): Payer: Medicare HMO

## 2023-09-24 ENCOUNTER — Emergency Department (HOSPITAL_COMMUNITY): Payer: Medicare HMO

## 2023-09-24 ENCOUNTER — Emergency Department (HOSPITAL_COMMUNITY)
Admission: EM | Admit: 2023-09-24 | Discharge: 2023-09-24 | Disposition: A | Discharge status: Home / Self Care | Payer: Medicare HMO | Attending: Emergency Medicine | Admitting: Emergency Medicine

## 2023-09-24 ENCOUNTER — Other Ambulatory Visit: Payer: Self-pay

## 2023-09-24 DIAGNOSIS — Z853 Personal history of malignant neoplasm of breast: Secondary | ICD-10-CM | POA: Insufficient documentation

## 2023-09-24 DIAGNOSIS — R531 Weakness: Secondary | ICD-10-CM | POA: Insufficient documentation

## 2023-09-24 DIAGNOSIS — I517 Cardiomegaly: Secondary | ICD-10-CM | POA: Diagnosis not present

## 2023-09-24 DIAGNOSIS — Z79899 Other long term (current) drug therapy: Secondary | ICD-10-CM | POA: Diagnosis not present

## 2023-09-24 DIAGNOSIS — E119 Type 2 diabetes mellitus without complications: Secondary | ICD-10-CM | POA: Insufficient documentation

## 2023-09-24 DIAGNOSIS — Z7984 Long term (current) use of oral hypoglycemic drugs: Secondary | ICD-10-CM | POA: Diagnosis not present

## 2023-09-24 DIAGNOSIS — N3000 Acute cystitis without hematuria: Secondary | ICD-10-CM | POA: Diagnosis not present

## 2023-09-24 DIAGNOSIS — R202 Paresthesia of skin: Secondary | ICD-10-CM | POA: Diagnosis not present

## 2023-09-24 DIAGNOSIS — Z7982 Long term (current) use of aspirin: Secondary | ICD-10-CM | POA: Diagnosis not present

## 2023-09-24 DIAGNOSIS — W19XXXA Unspecified fall, initial encounter: Secondary | ICD-10-CM | POA: Diagnosis not present

## 2023-09-24 DIAGNOSIS — R739 Hyperglycemia, unspecified: Secondary | ICD-10-CM | POA: Diagnosis not present

## 2023-09-24 DIAGNOSIS — J9811 Atelectasis: Secondary | ICD-10-CM | POA: Diagnosis not present

## 2023-09-24 DIAGNOSIS — J90 Pleural effusion, not elsewhere classified: Secondary | ICD-10-CM | POA: Diagnosis not present

## 2023-09-24 DIAGNOSIS — I10 Essential (primary) hypertension: Secondary | ICD-10-CM | POA: Diagnosis not present

## 2023-09-24 DIAGNOSIS — I959 Hypotension, unspecified: Secondary | ICD-10-CM | POA: Diagnosis not present

## 2023-09-24 LAB — URINALYSIS, W/ REFLEX TO CULTURE (INFECTION SUSPECTED)
Bilirubin Urine: NEGATIVE
Glucose, UA: NEGATIVE mg/dL
Hgb urine dipstick: NEGATIVE
Ketones, ur: NEGATIVE mg/dL
Nitrite: NEGATIVE
Protein, ur: 100 mg/dL — AB
Specific Gravity, Urine: 1.021 (ref 1.005–1.030)
pH: 5 (ref 5.0–8.0)

## 2023-09-24 LAB — BASIC METABOLIC PANEL
Anion gap: 13 (ref 5–15)
BUN: 35 mg/dL — ABNORMAL HIGH (ref 8–23)
CO2: 20 mmol/L — ABNORMAL LOW (ref 22–32)
Calcium: 9.8 mg/dL (ref 8.9–10.3)
Chloride: 102 mmol/L (ref 98–111)
Creatinine, Ser: 1.9 mg/dL — ABNORMAL HIGH (ref 0.44–1.00)
GFR, Estimated: 27 mL/min — ABNORMAL LOW (ref >60)
Glucose, Bld: 208 mg/dL — ABNORMAL HIGH (ref 70–99)
Potassium: 4.8 mmol/L (ref 3.5–5.1)
Sodium: 135 mmol/L (ref 135–145)

## 2023-09-24 LAB — CBC
HCT: 26.9 % — ABNORMAL LOW (ref 36.0–46.0)
Hemoglobin: 8.7 g/dL — ABNORMAL LOW (ref 12.0–15.0)
MCH: 26.4 pg (ref 26.0–34.0)
MCHC: 32.3 g/dL (ref 30.0–36.0)
MCV: 81.5 fL (ref 80.0–100.0)
Platelets: 258 10*3/uL (ref 150–400)
RBC: 3.3 MIL/uL — ABNORMAL LOW (ref 3.87–5.11)
RDW: 14.2 % (ref 11.5–15.5)
WBC: 8.6 10*3/uL (ref 4.0–10.5)
nRBC: 0.5 % — ABNORMAL HIGH (ref 0.0–0.2)

## 2023-09-24 MED ORDER — CEFADROXIL 500 MG PO CAPS: 500.0000 mg | ORAL_CAPSULE | Freq: Two times a day (BID) | ORAL | 0 refills | Status: DC

## 2023-09-24 MED ORDER — SODIUM CHLORIDE 0.9 % IV BOLUS
1000.0000 mL | Freq: Once | INTRAVENOUS | Status: AC
  Administered 2023-09-24: 1000 mL via INTRAVENOUS

## 2023-09-24 NOTE — ED Triage Notes (Addendum)
 Pt BIB EMS from home. C/O bilateral leg numbness. Family had to help pt use bathroom last night which is unusual. Pt unable to stand up. Axox4. Hx of stroke. pT HAS PERIPHERAL VISION CHANGES from previosu stroke.

## 2023-09-24 NOTE — Discharge Instructions (Addendum)
 Please follow-up closely with your primary care doctor to recheck your kidney function and further assess your weakness, please continue to follow with PT/OT at home and home health nursing.  Please take the entire course of antibiotics that we have prescribed.

## 2023-09-24 NOTE — ED Provider Triage Note (Signed)
 Emergency Medicine Provider Triage Evaluation Note  Heather Rogers , a 78 y.o. female  was evaluated in triage.  Pt complains of bilateral lower extremity weakness.  Started yesterday.  Having trouble going to and from the bathroom which is unusual for her.  Denies any swelling or pain in the legs.  Denies trauma.  Denies any chest pain or shortness of breath.  Review of Systems  Positive: See above Negative: See above  Physical Exam  BP (!) 124/58   Pulse 79   Temp 99.1 F (37.3 C) (Oral)   Resp 20   Ht 5\' 1"  (1.549 m)   Wt 71.2 kg   SpO2 95%   BMI 29.66 kg/m  Gen:   Awake, no distress   Resp:  Normal effort  MSK:   Moves extremities without difficulty  Other:    Medical Decision Making  Medically screening exam initiated at 9:48 AM.  Appropriate orders placed.  DEBBERA WOLKEN was informed that the remainder of the evaluation will be completed by another provider, this initial triage assessment does not replace that evaluation, and the importance of remaining in the ED until their evaluation is complete.  Work up started   Gareth Eagle, [place] 09/24/23 [phone]

## 2023-09-24 NOTE — ED Provider Notes (Signed)
 [place] EMERGENCY DEPARTMENT AT [facility] Provider Note   CSN: [id number] Arrival date & time: 09/24/23  4098     History  Chief Complaint  Patient presents with   Numbness    Heather Rogers is a 78 y.o. female with past medical history significant for previous CVA, diabetes, hypertension, gout, malignant breast cancer with multisystem metastasis, who presents with concern for bilateral leg numbness and weakness this morning.  She does have a history of peripheral neuropathy.  She reports that the numbness is intermittent, is currently resolved but she still has global weakness cannot walk which is her baseline.  HPI     Home Medications Prior to Admission medications   Medication Sig Start Date End Date Taking? Authorizing Provider  cefadroxil (DURICEF) 500 MG capsule Take 1 capsule (500 mg total) by mouth 2 (two) times daily. 09/24/23  Yes Crystalle Popwell H, PA-C  Accu-Chek FastClix Lancets MISC USE TO CHECK FASTING BLOOD SUGAR AND BEFORE MEALS AND AGAIN IF PATIENT FEELS BAD (SYMPTOMS OF HYPO) AS DIRECTED BY YOUR PRESCRIBER 10/12/22   [name], MD  allopurinol (ZYLOPRIM) 100 MG tablet Take 1 tablet (100 mg total) by mouth daily. 02/19/23   [name], MD  amLODipine (NORVASC) 10 MG tablet Take 1 tablet (10 mg total) by mouth daily. 02/19/23   [name], MD  aspirin EC 81 MG tablet Take 81 mg by mouth in the morning. Swallow whole.    [provider]  atorvastatin (LIPITOR) 80 MG tablet TAKE 1 TABLET EVERY EVENING 04/22/23   [name], MD  Blood Glucose Monitoring Suppl (ACCU-CHEK GUIDE) w/Device KIT 1 each by Does not apply route as directed. 08/25/18   Fulp, Cammie, MD  carvedilol (COREG) 25 MG tablet Take 1 tablet (25 mg total) by mouth 2 (two) times daily with a meal. 02/19/23   [name], MD  cholecalciferol (VITAMIN D3) 25 MCG (1000 UNIT) tablet Take 1,000 Units by mouth daily.    [provider]  ferrous sulfate  325 (65 FE) MG EC tablet Take 1 tablet (325 mg total) by mouth in the morning and at bedtime. 05/22/21   Mayers, Cari S, PA-C  gabapentin (NEURONTIN) 100 MG capsule Take 1 capsule (100 mg total) by mouth 3 (three) times daily. 02/19/23   [name], MD  glucose blood (ACCU-CHEK GUIDE) test strip USE TO CHECK FASTING BLOOD SUGAR AND BEFORE MEALS AND AGAIN IF PATIENT FEELS BAD; SYMPTOMS OF HYPOGLYCEMIA 09/20/21   [name], MD  letrozole (FEMARA) 2.5 MG tablet TAKE 1 TABLET BY MOUTH DAILY 01/11/23   Serena Croissant, MD  metFORMIN (GLUCOPHAGE) 500 MG tablet Take 1 tablet (500 mg total) by mouth daily with breakfast. 08/22/23   [name], MD  Multiple Vitamin (MULTIVITAMIN WITH MINERALS) TABS tablet Take 1 tablet by mouth daily. 09/09/21   Ghimire, Werner Lean, MD  ondansetron (ZOFRAN) 8 MG tablet TAKE 1 TABLET BY MOUTH EVERY 8 HOURS AS NEEDED FOR NAUSEA AND/OR VOMITING 06/24/23   Serena Croissant, MD  polyethylene glycol (MIRALAX / GLYCOLAX) 17 g packet Take 17 g by mouth daily as needed (constipation.).    Serena Croissant, MD  [name] 50 MG tablet TAKE 1 TABLET BY MOUTH TWO TIMES DAILY. SWALLOW WHOLE TABLETS; DO NOT CRUSH, CHEW, OR SPLIT TABLETS BEFORE SWALLOWING. 04/26/23   Serena Croissant, MD      Allergies    Sulfa antibiotics    Review of Systems   Review of Systems  All other systems reviewed and  are negative.   Physical Exam Updated Vital Signs BP (!) 128/52   Pulse 87   Temp 98.7 F (37.1 C) (Oral)   Resp 17   Ht 5\' 1"  (1.549 m)   Wt 71.2 kg   SpO2 99%   BMI 29.66 kg/m  Physical Exam Vitals and nursing note reviewed.  Constitutional:      General: She is not in acute distress.    Appearance: Normal appearance.  HENT:     Head: Normocephalic and atraumatic.  Eyes:     General:        Right eye: No discharge.        Left eye: No discharge.  Cardiovascular:     Rate and Rhythm: Normal rate and regular rhythm.     Heart sounds: No murmur heard.    No friction rub. No  gallop.  Pulmonary:     Effort: Pulmonary effort is normal.     Breath sounds: Normal breath sounds.  Abdominal:     General: Bowel sounds are normal.     Palpations: Abdomen is soft.  Skin:    General: Skin is warm and dry.     Capillary Refill: Capillary refill takes less than 2 seconds.  Neurological:     Mental Status: She is alert and oriented to person, place, and time.     Comments: Cranial nerves II through XII grossly intact.  Intact finger-nose, intact heel-to-shin.   Alert and oriented x3.  Moves all 4 limbs spontaneously, normal coordination.  No pronator drift.  Intact strength 5 out of 5 bilateral upper and lower extremities when examining in the bed  Some global weakness on attempted ambulation, unsteadiness with standing, refuses to try to ambulate  Psychiatric:        Mood and Affect: Mood normal.        Behavior: Behavior normal.     ED Results / Procedures / Treatments   Labs (all labs ordered are listed, but only abnormal results are displayed) Labs Reviewed  BASIC METABOLIC PANEL - Abnormal; Notable for the following components:      Result Value   CO2 20 (*)    Glucose, Bld 208 (*)    BUN 35 (*)    Creatinine, Ser 1.90 (*)    GFR, Estimated 27 (*)    All other components within normal limits  CBC - Abnormal; Notable for the following components:   RBC 3.30 (*)    Hemoglobin 8.7 (*)    HCT 26.9 (*)    nRBC 0.5 (*)    All other components within normal limits  URINALYSIS, W/ REFLEX TO CULTURE (INFECTION SUSPECTED) - Abnormal; Notable for the following components:   Color, Urine AMBER (*)    APPearance CLOUDY (*)    Protein, ur 100 (*)    Leukocytes,Ua TRACE (*)    Bacteria, UA FEW (*)    All other components within normal limits  URINE CULTURE    EKG None  Radiology DG Chest 2 View Result Date: 09/24/2023 CLINICAL DATA:  Weakness. EXAM: CHEST - 2 VIEW COMPARISON:  Chest radiograph dated 09/20/2023. FINDINGS: Small left pleural effusion and  left lung base atelectasis. The right lung is clear. No pneumothorax. Stable mild cardiomegaly. Loop recorder device. Atherosclerotic calcification of the aorta. No acute osseous pathology. IMPRESSION: Small left pleural effusion and left lung base atelectasis. Electronically Signed   By: Elgie Collard M.D.   On: 09/24/2023 11:27    Procedures Procedures    Medications  Ordered in ED Medications  sodium chloride 0.9 % bolus 1,000 mL (1,000 mLs Intravenous New Bag/Given 09/24/23 1931)    ED Course/ Medical Decision Making/ A&P                                 Medical Decision Making  This patient is a 78 y.o. female  who presents to the ED for concern of weakness, numbness.   Differential diagnoses prior to evaluation: The emergent differential diagnosis includes, but is not limited to,  CVA, spinal cord injury, ACS, arrhythmia, syncope, orthostatic hypotension, sepsis, hypoglycemia, hypoxia, electrolyte disturbance, endocrine disorder, anemia, environmental exposure, polypharmacy . This is not an exhaustive differential.   Past Medical History / Co-morbidities / Social History: previous CVA, diabetes, hypertension, gout, malignant breast cancer with multisystem metastasis  Additional history: Chart reviewed. Pertinent results include: Extensively reviewed lab work, imaging from previous emergency department visit just a few days ago, multiple hospital visits prior to this.  Physical Exam: Physical exam performed. The pertinent findings include:   Cranial nerves II through XII grossly intact.  Intact finger-nose-finger.   Alert and oriented x3.  Moves all 4 limbs spontaneously, normal coordination.  No pronator drift.  Intact strength 5 out of 5 bilateral upper and lower extremities when examining in the bed  Some global weakness on attempted ambulation, unsteadiness with standing, refuses to try to ambulate   Lab Tests/Imaging studies: I personally interpreted labs/imaging and  the pertinent results include: CBC with mild anemia, hemoglobin 8.7, overall stable compared to baseline although slightly lower than recent ED value of 9.4.  BNP with mildly elevated kidney function, BUN 35, creatinine 1.9.  Independently interpreted plain film chest x-ray which shows no evidence of acute intrathoracic abnormality.  I agree with the radiologist interpretation.  UA with trace leukocytes, 6-10 white blood cells and few bacteria given her new onset weakness with no other cause I think reasonable to treat for presumptive early UTI.   Medications: I ordered medication including fluid bolus for mild elevated kidney function.  I have reviewed the patients home medicines and have made adjustments as needed.   Disposition: After consideration of the diagnostic results and the patients response to treatment, I feel that patient stable for discharge, encourage close PCP follow-up and social work follow-up.   emergency department workup does not suggest an emergent condition requiring admission or immediate intervention beyond what has been performed at this time. The plan is: as above. The patient is safe for discharge and has been instructed to return immediately for worsening symptoms, change in symptoms or any other concerns.  Final Clinical Impression(s) / ED Diagnoses Final diagnoses:  Weakness  Paresthesias  Acute cystitis without hematuria    Rx / DC Orders ED Discharge Orders          Ordered    cefadroxil (DURICEF) 500 MG capsule  2 times daily        09/24/23 2109              [place] 09/24/23 2110    Glyn Ade, MD 09/26/23 1623

## 2023-09-26 ENCOUNTER — Telehealth: Payer: Self-pay

## 2023-09-26 ENCOUNTER — Ambulatory Visit (HOSPITAL_COMMUNITY): Payer: Medicare HMO

## 2023-09-26 ENCOUNTER — Inpatient Hospital Stay: Payer: Medicare HMO

## 2023-09-26 DIAGNOSIS — E1122 Type 2 diabetes mellitus with diabetic chronic kidney disease: Secondary | ICD-10-CM | POA: Diagnosis not present

## 2023-09-26 DIAGNOSIS — C50912 Malignant neoplasm of unspecified site of left female breast: Secondary | ICD-10-CM | POA: Diagnosis not present

## 2023-09-26 DIAGNOSIS — N1832 Chronic kidney disease, stage 3b: Secondary | ICD-10-CM | POA: Diagnosis not present

## 2023-09-26 DIAGNOSIS — I152 Hypertension secondary to endocrine disorders: Secondary | ICD-10-CM | POA: Diagnosis not present

## 2023-09-26 DIAGNOSIS — C787 Secondary malignant neoplasm of liver and intrahepatic bile duct: Secondary | ICD-10-CM | POA: Diagnosis not present

## 2023-09-26 DIAGNOSIS — D631 Anemia in chronic kidney disease: Secondary | ICD-10-CM | POA: Diagnosis not present

## 2023-09-26 DIAGNOSIS — D630 Anemia in neoplastic disease: Secondary | ICD-10-CM | POA: Diagnosis not present

## 2023-09-26 DIAGNOSIS — E1159 Type 2 diabetes mellitus with other circulatory complications: Secondary | ICD-10-CM | POA: Diagnosis not present

## 2023-09-26 DIAGNOSIS — C7800 Secondary malignant neoplasm of unspecified lung: Secondary | ICD-10-CM | POA: Diagnosis not present

## 2023-09-26 NOTE — Telephone Encounter (Signed)
 Received call from patient's daughter [name] [phone]. [name] reports the walker that was ordered on 09/20/23 has not been delivered. [name] reports she called [employer] and was told they do not have any orders for the patient.  Per chart review family chose [facility], order was placed for DME: rollator walker. [name] questioned if both medical insurance was on file for patient. Per chart review [employer] and [employer] on file.  This RNCM spoke with Zack with [employer] who will research and give this RNCM a call back.    TOC will continue to follow

## 2023-09-26 NOTE — Telephone Encounter (Signed)
 Verbal orders were given for patient.  Patient will be called to discuss walker.    Copied from CRM [id number]. TopicClinical - Home Health Verbal Orders >> Sep 26, 2023 12:27 PM Martha Clan wrote: Caller/Agency: [facility] Occupational Therapist Callback Number: [phone]   phone number is password protected so orders can be left as message. Service Requested: Occupational Therapy/ Skilled Nursing Frequency: 1 time a week for 6 weeks for OT/ Collect urine specimen for skilled nursing Any new concerns about the patient? Yes Patient went to ER for weakness, started on antibiotics for UTI. Patient is sleeping all day long and not eating. Patient needs a walker ordered.

## 2023-09-26 NOTE — Telephone Encounter (Signed)
 This RNCM spoke with representative with Dr. Odette Horns [place]'s office to request order for DME: rollator walker with seat. Representative will let MD know and will follow up.   No additional TOC needs at this time.

## 2023-09-26 NOTE — Telephone Encounter (Signed)
 Made follow up call to Zack with [employer] who reports no record of DME order. This RNCM advised the DME was ordered on 09/20/23 however patient has not received. Per further review DME: rolling walker was order instead of rollator walker. Zack located the order in [facility] and will follow up. Zack reports he can only supply the rolling walker per order on file.  This RNCM notified EDP that saw patient on 09/20/23 to inquire about getting a order for rollator walker, awaiting a response. Per chart review patient's daughter & HHPT has contacted patient's PCP.   TOC will continue to follow.

## 2023-09-26 NOTE — Telephone Encounter (Signed)
 I spoke to patient's daughter, [name] about the request for the rollator.  She explained that she has contacted a SW at [facility] and was told that a RW was ordered from [facility] and they are going to look into the issue.    [name] said she had requested a rollator -rolling walker with a seat, not a RW and [facility] told her that they would need a new order for the rollator.   I explained to her that we can place the order for the rollator but the insurance companies require documentation supporting the need for the rollator from the provider who is placing the order.    [name] then went on to say that she is probably going to take her mother back to the hospital because her mother is wheezing, does not want to get up and she thinks she is on the wrong medication for a UTI.  She said she will take her to [facility], not [facility]. She was very upset that her mother had to wait 12 hours in the ED at [facility] to be seen.    I told her that I will share her concerns with Dr Alvis Lemmings and   I apologized for the extended wait but told her that she can take her mother to any ED that she chooses.    I also told her that we can submit the order for the rollator and will contact her for a virtual appointment for her mother if needed. [name] also said she will ask the oncologist on 10/02/2023 if he will order the rollator.

## 2023-09-27 ENCOUNTER — Emergency Department (HOSPITAL_COMMUNITY): Payer: Medicare HMO

## 2023-09-27 ENCOUNTER — Inpatient Hospital Stay (HOSPITAL_COMMUNITY)
Admission: EM | Admit: 2023-09-27 | Discharge: 2023-10-06 | DRG: 871 | Disposition: A | Discharge status: Home Health | Payer: Medicare HMO | Attending: Internal Medicine | Admitting: Internal Medicine

## 2023-09-27 ENCOUNTER — Encounter (HOSPITAL_COMMUNITY): Payer: Self-pay

## 2023-09-27 ENCOUNTER — Telehealth: Payer: Self-pay | Admitting: Family Medicine

## 2023-09-27 ENCOUNTER — Other Ambulatory Visit: Payer: Self-pay

## 2023-09-27 DIAGNOSIS — Z8249 Family history of ischemic heart disease and other diseases of the circulatory system: Secondary | ICD-10-CM

## 2023-09-27 DIAGNOSIS — R509 Fever, unspecified: Secondary | ICD-10-CM | POA: Diagnosis present

## 2023-09-27 DIAGNOSIS — R188 Other ascites: Secondary | ICD-10-CM

## 2023-09-27 DIAGNOSIS — G9341 Metabolic encephalopathy: Secondary | ICD-10-CM

## 2023-09-27 DIAGNOSIS — E871 Hypo-osmolality and hyponatremia: Secondary | ICD-10-CM

## 2023-09-27 DIAGNOSIS — R6510 Systemic inflammatory response syndrome (SIRS) of non-infectious origin without acute organ dysfunction: Secondary | ICD-10-CM

## 2023-09-27 DIAGNOSIS — R739 Hyperglycemia, unspecified: Secondary | ICD-10-CM | POA: Diagnosis not present

## 2023-09-27 DIAGNOSIS — N179 Acute kidney failure, unspecified: Secondary | ICD-10-CM | POA: Diagnosis present

## 2023-09-27 DIAGNOSIS — Z8616 Personal history of COVID-19: Secondary | ICD-10-CM

## 2023-09-27 DIAGNOSIS — C7951 Secondary malignant neoplasm of bone: Secondary | ICD-10-CM | POA: Diagnosis present

## 2023-09-27 DIAGNOSIS — Z8673 Personal history of transient ischemic attack (TIA), and cerebral infarction without residual deficits: Secondary | ICD-10-CM

## 2023-09-27 DIAGNOSIS — Z823 Family history of stroke: Secondary | ICD-10-CM

## 2023-09-27 DIAGNOSIS — E1140 Type 2 diabetes mellitus with diabetic neuropathy, unspecified: Secondary | ICD-10-CM | POA: Diagnosis present

## 2023-09-27 DIAGNOSIS — M8448XA Pathological fracture, other site, initial encounter for fracture: Secondary | ICD-10-CM | POA: Diagnosis present

## 2023-09-27 DIAGNOSIS — D630 Anemia in neoplastic disease: Secondary | ICD-10-CM | POA: Diagnosis not present

## 2023-09-27 DIAGNOSIS — A419 Sepsis, unspecified organism: Secondary | ICD-10-CM | POA: Diagnosis present

## 2023-09-27 DIAGNOSIS — R7989 Other specified abnormal findings of blood chemistry: Secondary | ICD-10-CM | POA: Diagnosis not present

## 2023-09-27 DIAGNOSIS — Z882 Allergy status to sulfonamides status: Secondary | ICD-10-CM

## 2023-09-27 DIAGNOSIS — Z1152 Encounter for screening for COVID-19: Secondary | ICD-10-CM

## 2023-09-27 DIAGNOSIS — R5383 Other fatigue: Secondary | ICD-10-CM | POA: Diagnosis not present

## 2023-09-27 DIAGNOSIS — C787 Secondary malignant neoplasm of liver and intrahepatic bile duct: Secondary | ICD-10-CM | POA: Diagnosis present

## 2023-09-27 DIAGNOSIS — C50919 Malignant neoplasm of unspecified site of unspecified female breast: Secondary | ICD-10-CM | POA: Diagnosis not present

## 2023-09-27 DIAGNOSIS — C50912 Malignant neoplasm of unspecified site of left female breast: Secondary | ICD-10-CM | POA: Insufficient documentation

## 2023-09-27 DIAGNOSIS — E1165 Type 2 diabetes mellitus with hyperglycemia: Secondary | ICD-10-CM | POA: Diagnosis present

## 2023-09-27 DIAGNOSIS — E440 Moderate protein-calorie malnutrition: Secondary | ICD-10-CM | POA: Diagnosis present

## 2023-09-27 DIAGNOSIS — I6782 Cerebral ischemia: Secondary | ICD-10-CM | POA: Diagnosis not present

## 2023-09-27 DIAGNOSIS — E785 Hyperlipidemia, unspecified: Secondary | ICD-10-CM | POA: Diagnosis present

## 2023-09-27 DIAGNOSIS — I700 Atherosclerosis of aorta: Secondary | ICD-10-CM | POA: Diagnosis not present

## 2023-09-27 DIAGNOSIS — G8929 Other chronic pain: Secondary | ICD-10-CM | POA: Diagnosis present

## 2023-09-27 DIAGNOSIS — E1159 Type 2 diabetes mellitus with other circulatory complications: Secondary | ICD-10-CM | POA: Diagnosis not present

## 2023-09-27 DIAGNOSIS — E1122 Type 2 diabetes mellitus with diabetic chronic kidney disease: Secondary | ICD-10-CM | POA: Diagnosis present

## 2023-09-27 DIAGNOSIS — J90 Pleural effusion, not elsewhere classified: Secondary | ICD-10-CM | POA: Diagnosis not present

## 2023-09-27 DIAGNOSIS — C7800 Secondary malignant neoplasm of unspecified lung: Secondary | ICD-10-CM | POA: Diagnosis not present

## 2023-09-27 DIAGNOSIS — N1832 Chronic kidney disease, stage 3b: Secondary | ICD-10-CM | POA: Diagnosis present

## 2023-09-27 DIAGNOSIS — E860 Dehydration: Secondary | ICD-10-CM | POA: Diagnosis present

## 2023-09-27 DIAGNOSIS — I152 Hypertension secondary to endocrine disorders: Secondary | ICD-10-CM | POA: Diagnosis not present

## 2023-09-27 DIAGNOSIS — Z794 Long term (current) use of insulin: Secondary | ICD-10-CM | POA: Diagnosis not present

## 2023-09-27 DIAGNOSIS — Z66 Do not resuscitate: Secondary | ICD-10-CM | POA: Diagnosis present

## 2023-09-27 DIAGNOSIS — R0689 Other abnormalities of breathing: Secondary | ICD-10-CM | POA: Diagnosis not present

## 2023-09-27 DIAGNOSIS — Z79811 Long term (current) use of aromatase inhibitors: Secondary | ICD-10-CM

## 2023-09-27 DIAGNOSIS — R180 Malignant ascites: Secondary | ICD-10-CM | POA: Diagnosis present

## 2023-09-27 DIAGNOSIS — E861 Hypovolemia: Secondary | ICD-10-CM | POA: Diagnosis present

## 2023-09-27 DIAGNOSIS — I129 Hypertensive chronic kidney disease with stage 1 through stage 4 chronic kidney disease, or unspecified chronic kidney disease: Secondary | ICD-10-CM | POA: Diagnosis present

## 2023-09-27 DIAGNOSIS — C782 Secondary malignant neoplasm of pleura: Secondary | ICD-10-CM | POA: Diagnosis present

## 2023-09-27 DIAGNOSIS — D631 Anemia in chronic kidney disease: Secondary | ICD-10-CM | POA: Diagnosis present

## 2023-09-27 DIAGNOSIS — D649 Anemia, unspecified: Secondary | ICD-10-CM

## 2023-09-27 DIAGNOSIS — R531 Weakness: Secondary | ICD-10-CM | POA: Diagnosis not present

## 2023-09-27 DIAGNOSIS — Z7982 Long term (current) use of aspirin: Secondary | ICD-10-CM

## 2023-09-27 DIAGNOSIS — R7401 Elevation of levels of liver transaminase levels: Secondary | ICD-10-CM | POA: Diagnosis not present

## 2023-09-27 DIAGNOSIS — R4182 Altered mental status, unspecified: Secondary | ICD-10-CM | POA: Diagnosis not present

## 2023-09-27 DIAGNOSIS — K769 Liver disease, unspecified: Secondary | ICD-10-CM | POA: Diagnosis not present

## 2023-09-27 DIAGNOSIS — Z7984 Long term (current) use of oral hypoglycemic drugs: Secondary | ICD-10-CM

## 2023-09-27 DIAGNOSIS — R109 Unspecified abdominal pain: Secondary | ICD-10-CM | POA: Diagnosis not present

## 2023-09-27 DIAGNOSIS — K6389 Other specified diseases of intestine: Secondary | ICD-10-CM | POA: Diagnosis not present

## 2023-09-27 DIAGNOSIS — Z6829 Body mass index (BMI) 29.0-29.9, adult: Secondary | ICD-10-CM

## 2023-09-27 DIAGNOSIS — Z87891 Personal history of nicotine dependence: Secondary | ICD-10-CM

## 2023-09-27 DIAGNOSIS — I213 ST elevation (STEMI) myocardial infarction of unspecified site: Secondary | ICD-10-CM | POA: Diagnosis not present

## 2023-09-27 DIAGNOSIS — Z90710 Acquired absence of both cervix and uterus: Secondary | ICD-10-CM

## 2023-09-27 DIAGNOSIS — I10 Essential (primary) hypertension: Secondary | ICD-10-CM | POA: Diagnosis not present

## 2023-09-27 DIAGNOSIS — Z79899 Other long term (current) drug therapy: Secondary | ICD-10-CM

## 2023-09-27 DIAGNOSIS — A0472 Enterocolitis due to Clostridium difficile, not specified as recurrent: Secondary | ICD-10-CM | POA: Diagnosis present

## 2023-09-27 DIAGNOSIS — M109 Gout, unspecified: Secondary | ICD-10-CM | POA: Diagnosis present

## 2023-09-27 LAB — CBC WITH DIFFERENTIAL/PLATELET
Abs Immature Granulocytes: 0.11 10*3/uL — ABNORMAL HIGH (ref 0.00–0.07)
Basophils Absolute: 0 10*3/uL (ref 0.0–0.1)
Basophils Relative: 0 %
Eosinophils Absolute: 0 10*3/uL (ref 0.0–0.5)
Eosinophils Relative: 0 %
HCT: 22.6 % — ABNORMAL LOW (ref 36.0–46.0)
Hemoglobin: 7.6 g/dL — ABNORMAL LOW (ref 12.0–15.0)
Immature Granulocytes: 1 %
Lymphocytes Relative: 7 %
Lymphs Abs: 0.8 10*3/uL (ref 0.7–4.0)
MCH: 26.3 pg (ref 26.0–34.0)
MCHC: 33.6 g/dL (ref 30.0–36.0)
MCV: 78.2 fL — ABNORMAL LOW (ref 80.0–100.0)
Monocytes Absolute: 1.9 10*3/uL — ABNORMAL HIGH (ref 0.1–1.0)
Monocytes Relative: 16 %
Neutro Abs: 9 10*3/uL — ABNORMAL HIGH (ref 1.7–7.7)
Neutrophils Relative %: 76 %
Platelets: 290 10*3/uL (ref 150–400)
RBC: 2.89 MIL/uL — ABNORMAL LOW (ref 3.87–5.11)
RDW: 14.3 % (ref 11.5–15.5)
WBC: 11.7 10*3/uL — ABNORMAL HIGH (ref 4.0–10.5)
nRBC: 0.9 % — ABNORMAL HIGH (ref 0.0–0.2)

## 2023-09-27 LAB — RESP PANEL BY RT-PCR (RSV, FLU A&B, COVID)  RVPGX2
Influenza A by PCR: NEGATIVE
Influenza B by PCR: NEGATIVE
Resp Syncytial Virus by PCR: NEGATIVE
SARS Coronavirus 2 by RT PCR: NEGATIVE

## 2023-09-27 LAB — COMPREHENSIVE METABOLIC PANEL
ALT: 96 U/L — ABNORMAL HIGH (ref 0–44)
AST: 235 U/L — ABNORMAL HIGH (ref 15–41)
Albumin: 2.6 g/dL — ABNORMAL LOW (ref 3.5–5.0)
Alkaline Phosphatase: 149 U/L — ABNORMAL HIGH (ref 38–126)
Anion gap: 11 (ref 5–15)
BUN: 54 mg/dL — ABNORMAL HIGH (ref 8–23)
CO2: 18 mmol/L — ABNORMAL LOW (ref 22–32)
Calcium: 8.8 mg/dL — ABNORMAL LOW (ref 8.9–10.3)
Chloride: 103 mmol/L (ref 98–111)
Creatinine, Ser: 2.63 mg/dL — ABNORMAL HIGH (ref 0.44–1.00)
GFR, Estimated: 18 mL/min — ABNORMAL LOW (ref >60)
Glucose, Bld: 387 mg/dL — ABNORMAL HIGH (ref 70–99)
Potassium: 4.6 mmol/L (ref 3.5–5.1)
Sodium: 132 mmol/L — ABNORMAL LOW (ref 135–145)
Total Bilirubin: 0.8 mg/dL (ref 0.0–1.2)
Total Protein: 6.7 g/dL (ref 6.5–8.1)

## 2023-09-27 LAB — RETICULOCYTES
Immature Retic Fract: 10.2 % (ref 2.3–15.9)
RBC.: 2.45 MIL/uL — ABNORMAL LOW (ref 3.87–5.11)
Retic Count, Absolute: 15.4 10*3/uL — ABNORMAL LOW (ref 19.0–186.0)
Retic Ct Pct: 0.6 % (ref 0.4–3.1)

## 2023-09-27 LAB — URINALYSIS, W/ REFLEX TO CULTURE (INFECTION SUSPECTED)
Bacteria, UA: NONE SEEN
Bilirubin Urine: NEGATIVE
Glucose, UA: 50 mg/dL — AB
Ketones, ur: NEGATIVE mg/dL
Leukocytes,Ua: NEGATIVE
Nitrite: NEGATIVE
Protein, ur: 100 mg/dL — AB
Specific Gravity, Urine: 1.023 (ref 1.005–1.030)
pH: 5 (ref 5.0–8.0)

## 2023-09-27 LAB — URINE CULTURE: Culture: 20000 — AB

## 2023-09-27 LAB — POC OCCULT BLOOD, ED: Fecal Occult Bld: NEGATIVE

## 2023-09-27 LAB — CBG MONITORING, ED: Glucose-Capillary: 356 mg/dL — ABNORMAL HIGH (ref 70–99)

## 2023-09-27 LAB — PROTIME-INR
INR: 1.3 — ABNORMAL HIGH (ref 0.8–1.2)
Prothrombin Time: 16.4 s — ABNORMAL HIGH (ref 11.4–15.2)

## 2023-09-27 LAB — I-STAT CG4 LACTIC ACID, ED: Lactic Acid, Venous: 1.7 mmol/L (ref 0.5–1.9)

## 2023-09-27 LAB — APTT: aPTT: 34 s (ref 24–36)

## 2023-09-27 MED ORDER — SODIUM CHLORIDE 0.9 % IV SOLN
2.0000 g | Freq: Once | INTRAVENOUS | Status: AC
  Administered 2023-09-27: 2 g via INTRAVENOUS
  Filled 2023-09-27: qty 20

## 2023-09-27 MED ORDER — LACTATED RINGERS IV SOLN: INTRAVENOUS | Status: DC

## 2023-09-27 MED ORDER — METRONIDAZOLE 500 MG/100ML IV SOLN
500.0000 mg | Freq: Once | INTRAVENOUS | Status: AC
  Administered 2023-09-27: 500 mg via INTRAVENOUS
  Filled 2023-09-27: qty 100

## 2023-09-27 MED ORDER — LACTATED RINGERS IV BOLUS (SEPSIS)
1000.0000 mL | Freq: Once | INTRAVENOUS | Status: AC
  Administered 2023-09-27: 1000 mL via INTRAVENOUS

## 2023-09-27 MED ORDER — ACETAMINOPHEN 10 MG/ML IV SOLN
1000.0000 mg | Freq: Once | INTRAVENOUS | Status: AC
Start: 2023-09-27 — End: 2023-09-27
  Administered 2023-09-27: 1000 mg via INTRAVENOUS
  Filled 2023-09-27: qty 100

## 2023-09-27 MED ORDER — LACTATED RINGERS IV BOLUS (SEPSIS)
250.0000 mL | Freq: Once | INTRAVENOUS | Status: AC
  Administered 2023-09-27: 250 mL via INTRAVENOUS

## 2023-09-27 MED ORDER — VANCOMYCIN HCL IN DEXTROSE 1-5 GM/200ML-% IV SOLN
1000.0000 mg | Freq: Once | INTRAVENOUS | Status: AC
  Administered 2023-09-28: 1000 mg via INTRAVENOUS
  Filled 2023-09-27: qty 200

## 2023-09-27 NOTE — Telephone Encounter (Signed)
 Copied from CRM [id number]. Topic: General - Other >> Sep 26, 2023  5:37 PM Antony Haste wrote: Reason for CRM: RN Case Manager at [facility] ER Department is wanting to know if Dr. Alvis Lemmings can send in an order for the patient to receive DME supply - Rollader Walker w/ a seat included? Advised to please follow-up with patient's daughter, [name] at [phone].

## 2023-09-27 NOTE — ED Triage Notes (Signed)
 Pt BIBA from home, c/o increased lethargy, and weakness.  Was prescribed antibiotics for UTI has not been taking it.  Hx of breast cancer. A/Ox4   CBG 491 ETCo2 24

## 2023-09-27 NOTE — ED Notes (Signed)
 Patient transported to CT

## 2023-09-27 NOTE — Sepsis Progress Note (Signed)
 Elink monitoring for the code sepsis protocol.

## 2023-09-27 NOTE — Telephone Encounter (Signed)
 See previous encounter from Case Manger. 09/26/2023

## 2023-09-27 NOTE — ED Provider Notes (Signed)
 [place] EMERGENCY DEPARTMENT AT [facility] Provider Note   CSN: [id number] Arrival date & time: 09/27/23  2003     History Chief Complaint  Patient presents with   Fatigue   Weakness    Heather Rogers is a 78 y.o. female.  Patient with past history significant for prior CVA, diabetes, hypertension, gout, malignant breast cancer with multisystem metastases presents the emergency department today with concerns of weakness.  Patient bedside with her daughter who reports that in the last day or so, patient has become increasingly confused and altered.  Fevers and cough began today.  Patient reportedly was at the emergency department a few days ago and diagnosed with a UTI and started on antibiotics.  Patient has reportedly been taking this medication as prescribed.  No recent sick contacts but unsure patient was possibly exposed while in the emergency department to a viral infection.  Patient's daughter reports that she supposedly defecated on the floor yesterday evening.  HPI     Home Medications Prior to Admission medications   Medication Sig Start Date End Date Taking? Authorizing Provider  Accu-Chek FastClix Lancets MISC USE TO CHECK FASTING BLOOD SUGAR AND BEFORE MEALS AND AGAIN IF PATIENT FEELS BAD (SYMPTOMS OF HYPO) AS DIRECTED BY YOUR PRESCRIBER 10/12/22   [name], MD  allopurinol (ZYLOPRIM) 100 MG tablet Take 1 tablet (100 mg total) by mouth daily. 02/19/23   [name], MD  amLODipine (NORVASC) 10 MG tablet Take 1 tablet (10 mg total) by mouth daily. 02/19/23   [name], MD  aspirin EC 81 MG tablet Take 81 mg by mouth in the morning. Swallow whole.    [provider]  atorvastatin (LIPITOR) 80 MG tablet TAKE 1 TABLET EVERY EVENING 04/22/23   [name], MD  Blood Glucose Monitoring Suppl (ACCU-CHEK GUIDE) w/Device KIT 1 each by Does not apply route as directed. 08/25/18   Fulp, Cammie, MD  carvedilol (COREG) 25 MG tablet Take 1  tablet (25 mg total) by mouth 2 (two) times daily with a meal. 02/19/23   [name], MD  cefadroxil (DURICEF) 500 MG capsule Take 1 capsule (500 mg total) by mouth 2 (two) times daily. 09/24/23   Prosperi, Christian H, PA-C  cholecalciferol (VITAMIN D3) 25 MCG (1000 UNIT) tablet Take 1,000 Units by mouth daily.    [provider]  ferrous sulfate 325 (65 FE) MG EC tablet Take 1 tablet (325 mg total) by mouth in the morning and at bedtime. 05/22/21   Mayers, Cari S, PA-C  gabapentin (NEURONTIN) 100 MG capsule Take 1 capsule (100 mg total) by mouth 3 (three) times daily. 02/19/23   [name], MD  glucose blood (ACCU-CHEK GUIDE) test strip USE TO CHECK FASTING BLOOD SUGAR AND BEFORE MEALS AND AGAIN IF PATIENT FEELS BAD; SYMPTOMS OF HYPOGLYCEMIA 09/20/21   [name], MD  letrozole (FEMARA) 2.5 MG tablet TAKE 1 TABLET BY MOUTH DAILY 01/11/23   Serena Croissant, MD  metFORMIN (GLUCOPHAGE) 500 MG tablet Take 1 tablet (500 mg total) by mouth daily with breakfast. 08/22/23   [name], MD  Multiple Vitamin (MULTIVITAMIN WITH MINERALS) TABS tablet Take 1 tablet by mouth daily. 09/09/21   Ghimire, Werner Lean, MD  ondansetron (ZOFRAN) 8 MG tablet TAKE 1 TABLET BY MOUTH EVERY 8 HOURS AS NEEDED FOR NAUSEA AND/OR VOMITING 06/24/23   Serena Croissant, MD  polyethylene glycol (MIRALAX / GLYCOLAX) 17 g packet Take 17 g by mouth daily as needed (constipation.).    Serena Croissant,  MD  [name] 50 MG tablet TAKE 1 TABLET BY MOUTH TWO TIMES DAILY. SWALLOW WHOLE TABLETS; DO NOT CRUSH, CHEW, OR SPLIT TABLETS BEFORE SWALLOWING. 04/26/23   Serena Croissant, MD      Allergies    Sulfa antibiotics    Review of Systems   Review of Systems  Constitutional:  Positive for activity change and fever.  All other systems reviewed and are negative.   Physical Exam Updated Vital Signs BP (!) 156/73   Pulse 84   Temp (!) 103.2 F (39.6 C) (Rectal)   Resp (!) 29   Ht 5\' 1"  (1.549 m)   Wt 71.2 kg   SpO2 96%    BMI 29.67 kg/m  Physical Exam Vitals and nursing note reviewed.  Constitutional:      General: She is not in acute distress.    Appearance: She is well-developed.  HENT:     Head: Normocephalic and atraumatic.  Eyes:     Conjunctiva/sclera: Conjunctivae normal.  Cardiovascular:     Rate and Rhythm: Normal rate and regular rhythm.     Heart sounds: No murmur heard. Pulmonary:     Effort: Pulmonary effort is normal. No respiratory distress.     Breath sounds: Normal breath sounds.  Abdominal:     General: There is distension.     Palpations: Abdomen is soft. There is no mass.     Tenderness: There is no abdominal tenderness. There is no guarding.     Comments: Some distension but no TTP.  Musculoskeletal:        General: No swelling.     Cervical back: Neck supple.  Skin:    General: Skin is warm and dry.     Capillary Refill: Capillary refill takes less than 2 seconds.  Neurological:     Mental Status: She is alert.  Psychiatric:        Mood and Affect: Mood normal.     ED Results / Procedures / Treatments   Labs (all labs ordered are listed, but only abnormal results are displayed) Labs Reviewed  COMPREHENSIVE METABOLIC PANEL - Abnormal; Notable for the following components:      Result Value   Sodium 132 (*)    CO2 18 (*)    Glucose, Bld 387 (*)    BUN 54 (*)    Creatinine, Ser 2.63 (*)    Calcium 8.8 (*)    Albumin 2.6 (*)    AST 235 (*)    ALT 96 (*)    Alkaline Phosphatase 149 (*)    GFR, Estimated 18 (*)    All other components within normal limits  CBC WITH DIFFERENTIAL/PLATELET - Abnormal; Notable for the following components:   WBC 11.7 (*)    RBC 2.89 (*)    Hemoglobin 7.6 (*)    HCT 22.6 (*)    MCV 78.2 (*)    nRBC 0.9 (*)    Neutro Abs 9.0 (*)    Monocytes Absolute 1.9 (*)    Abs Immature Granulocytes 0.11 (*)    All other components within normal limits  URINALYSIS, W/ REFLEX TO CULTURE (INFECTION SUSPECTED) - Abnormal; Notable for the  following components:   Color, Urine AMBER (*)    APPearance CLOUDY (*)    Glucose, UA 50 (*)    Hgb urine dipstick SMALL (*)    Protein, ur 100 (*)    All other components within normal limits  PROTIME-INR - Abnormal; Notable for the following components:   Prothrombin Time  16.4 (*)    INR 1.3 (*)    All other components within normal limits  CBG MONITORING, ED - Abnormal; Notable for the following components:   Glucose-Capillary 356 (*)    All other components within normal limits  RESP PANEL BY RT-PCR (RSV, FLU A&B, COVID)  RVPGX2  CULTURE, BLOOD (ROUTINE X 2)  CULTURE, BLOOD (ROUTINE X 2)  APTT  VITAMIN B12  FOLATE  IRON AND TIBC  FERRITIN  RETICULOCYTES  I-STAT CG4 LACTIC ACID, ED  I-STAT CG4 LACTIC ACID, ED  POC OCCULT BLOOD, ED    EKG EKG Interpretation Date/Time:  Friday September 27 2023 20:20:19 EST Ventricular Rate:  81 PR Interval:  165 QRS Duration:  83 QT Interval:  351 QTC Calculation: 408 R Axis:   53  Text Interpretation: Sinus rhythm Anteroseptal infarct, age indeterminate Confirmed by Coralee Pesa ([id number]) on 09/27/2023 8:34:15 PM  Radiology DG Chest Port 1 View Result Date: 09/27/2023 CLINICAL DATA:  Increased lethargy and weakness, initial encounter EXAM: PORTABLE CHEST 1 VIEW COMPARISON:  09/24/2023 FINDINGS: Cardiac shadow is stable. Loop recorder is again noted. Aortic calcifications are seen. The lungs are well aerated without focal infiltrate. Previously seen left effusion has resolved. No bony abnormality is noted. IMPRESSION: No active disease. Electronically Signed   By: Alcide Clever M.D.   On: 09/27/2023 21:46    Procedures .Critical Care  Performed by: Smitty Knudsen, PA-C Authorized by: Smitty Knudsen, PA-C   Critical care provider statement:    Critical care time (minutes):  97   Critical care start time:  09/27/2023 9:00 PM   Critical care end time:  09/27/2023 10:37 PM   Critical care time was exclusive of:  Separately billable  procedures and treating other patients   Critical care was necessary to treat or prevent imminent or life-threatening deterioration of the following conditions:  Sepsis   Critical care was time spent personally by me on the following activities:  Development of treatment plan with patient or surrogate, evaluation of patient's response to treatment, examination of patient, ordering and review of laboratory studies, ordering and performing treatments and interventions, ordering and review of radiographic studies and re-evaluation of patient's condition   I assumed direction of critical care for this patient from another provider in my specialty: no     Care discussed with: admitting provider       Medications Ordered in ED Medications  lactated ringers infusion ( Intravenous New Bag/Given 09/27/23 2047)  metroNIDAZOLE (FLAGYL) IVPB 500 mg (has no administration in time range)  vancomycin (VANCOCIN) IVPB 1000 mg/200 mL premix (has no administration in time range)  cefTRIAXone (ROCEPHIN) 2 g in sodium chloride 0.9 % 100 mL IVPB (0 g Intravenous Stopped 09/27/23 2118)  lactated ringers bolus 1,000 mL (0 mLs Intravenous Stopped 09/27/23 2112)    And  lactated ringers bolus 1,000 mL (1,000 mLs Intravenous New Bag/Given 09/27/23 2124)    And  lactated ringers bolus 250 mL (0 mLs Intravenous Stopped 09/27/23 2132)  acetaminophen (OFIRMEV) IV 1,000 mg (0 mg Intravenous Stopped 09/27/23 2208)    ED Course/ Medical Decision Making/ A&P                                 Medical Decision Making Amount and/or Complexity of Data Reviewed Labs: ordered. Radiology: ordered.  Risk Prescription drug management. Decision regarding hospitalization.   This patient presents to the ED for concern of  fatigue, weakness.  Differential diagnosis includes dehydration, sepsis, viral URI, pneumonia   Lab Tests:  I Ordered, and personally interpreted labs.  The pertinent results include: CBC leukocytosis worsening  compared to most recent labs obtained, hemoglobin down to 7.6 which is downtrending from 9.8 from 1 week ago.  CMP with some worsening kidney function close to an AKI.  UA without obvious signs of infection as there are no nitrites, leukocytes, or bacteria seen.  Lactic acid normal at 1.7.  Hemoccult negative.   Imaging Studies ordered:  I ordered imaging studies including chest x-ray, CT abdomen pelvis I independently visualized and interpreted imaging which showed no acute cardiopulmonary process seen , CT abdomen pelvis pending at time of handoff I agree with the radiologist interpretation   Medicines ordered and prescription drug management:  I ordered medication including fluids, Rocephin, Tylenol for sepsis, fever Reevaluation of the patient after these medicines showed that the patient stayed the same I have reviewed the patients home medicines and have made adjustments as needed   Problem List / ED Course:  Patient with past history significant for CVA, diabetes, retention, gout, malignant breast cancer with multisystem metastasis presents ED with concerns of weakness.  Patient with daughter at bedside reports that patient with increasing confused and altered mental last day or so.  Fevers and cough started today.  There is also concern for some abdominal distention.  Daughter does report that there has been some darker stools with patient.  Has a prior history of suspected GI bleed with required blood transfusion. Physical exam is concerning with some abdominal distention.  No focal tenderness.  No abnormal lung sounds. Appears the patient was received in the emergency department for concerns of weakness.  Was diagnosed with a suspected UTI and discharged home with antibiotics.  Patient reports has been taking these.  Denies any acute urinary symptoms. Noted a drop in hemoglobin from 9.8 to 7.6 in last week.  Concern for possible bleeding as daughter reports some dark stool but states  she is on an iron supplement so unclear if this is due to the iron itself or possible bleeding.  Of note, daughter does report some increasing abdominal distention.  Patient denies any abdominal pain.  Will obtain CT imaging to rule out any possible perforation.  10:46 PM Care of Heather Rogers transferred to [facility] and Dr. Wilkie Aye at the end of my shift as the patient will require reassessment once labs/imaging have resulted. Patient presentation, ED course, and plan of care discussed with review of all pertinent labs and imaging. Please see his/her note for further details regarding further ED course and disposition. Plan at time of handoff is admit for suspected sepsis, AMS. Unclear source of infection. CTAP pending at this time for assessment of concern of distension. This may be altered or completely changed at the discretion of the oncoming team pending results of further workup.   Final Clinical Impression(s) / ED Diagnoses Final diagnoses:  Sepsis without acute organ dysfunction, due to unspecified organism [facility])    Rx / DC Orders ED Discharge Orders     None         Smitty Knudsen, PA-C 09/27/23 2246    Horton, Clabe Seal, DO 09/30/23 1517

## 2023-09-28 ENCOUNTER — Telehealth (HOSPITAL_BASED_OUTPATIENT_CLINIC_OR_DEPARTMENT_OTHER): Payer: Self-pay | Admitting: *Deleted

## 2023-09-28 ENCOUNTER — Inpatient Hospital Stay (HOSPITAL_COMMUNITY)

## 2023-09-28 DIAGNOSIS — R7401 Elevation of levels of liver transaminase levels: Secondary | ICD-10-CM | POA: Diagnosis not present

## 2023-09-28 DIAGNOSIS — R188 Other ascites: Secondary | ICD-10-CM

## 2023-09-28 DIAGNOSIS — E871 Hypo-osmolality and hyponatremia: Secondary | ICD-10-CM | POA: Diagnosis present

## 2023-09-28 DIAGNOSIS — Z66 Do not resuscitate: Secondary | ICD-10-CM | POA: Diagnosis present

## 2023-09-28 DIAGNOSIS — R531 Weakness: Secondary | ICD-10-CM | POA: Diagnosis not present

## 2023-09-28 DIAGNOSIS — I6782 Cerebral ischemia: Secondary | ICD-10-CM | POA: Diagnosis not present

## 2023-09-28 DIAGNOSIS — D631 Anemia in chronic kidney disease: Secondary | ICD-10-CM | POA: Diagnosis present

## 2023-09-28 DIAGNOSIS — J90 Pleural effusion, not elsewhere classified: Secondary | ICD-10-CM | POA: Diagnosis not present

## 2023-09-28 DIAGNOSIS — R180 Malignant ascites: Secondary | ICD-10-CM | POA: Diagnosis present

## 2023-09-28 DIAGNOSIS — E440 Moderate protein-calorie malnutrition: Secondary | ICD-10-CM | POA: Diagnosis present

## 2023-09-28 DIAGNOSIS — R7989 Other specified abnormal findings of blood chemistry: Secondary | ICD-10-CM

## 2023-09-28 DIAGNOSIS — C50912 Malignant neoplasm of unspecified site of left female breast: Secondary | ICD-10-CM | POA: Diagnosis present

## 2023-09-28 DIAGNOSIS — E1165 Type 2 diabetes mellitus with hyperglycemia: Secondary | ICD-10-CM | POA: Diagnosis present

## 2023-09-28 DIAGNOSIS — C50919 Malignant neoplasm of unspecified site of unspecified female breast: Secondary | ICD-10-CM | POA: Diagnosis not present

## 2023-09-28 DIAGNOSIS — R509 Fever, unspecified: Secondary | ICD-10-CM | POA: Diagnosis present

## 2023-09-28 DIAGNOSIS — D649 Anemia, unspecified: Secondary | ICD-10-CM | POA: Diagnosis not present

## 2023-09-28 DIAGNOSIS — K769 Liver disease, unspecified: Secondary | ICD-10-CM | POA: Diagnosis not present

## 2023-09-28 DIAGNOSIS — M8448XA Pathological fracture, other site, initial encounter for fracture: Secondary | ICD-10-CM | POA: Diagnosis present

## 2023-09-28 DIAGNOSIS — Z8616 Personal history of COVID-19: Secondary | ICD-10-CM | POA: Diagnosis not present

## 2023-09-28 DIAGNOSIS — Z1152 Encounter for screening for COVID-19: Secondary | ICD-10-CM | POA: Diagnosis not present

## 2023-09-28 DIAGNOSIS — C7951 Secondary malignant neoplasm of bone: Secondary | ICD-10-CM | POA: Diagnosis present

## 2023-09-28 DIAGNOSIS — A419 Sepsis, unspecified organism: Secondary | ICD-10-CM | POA: Diagnosis present

## 2023-09-28 DIAGNOSIS — I129 Hypertensive chronic kidney disease with stage 1 through stage 4 chronic kidney disease, or unspecified chronic kidney disease: Secondary | ICD-10-CM | POA: Diagnosis present

## 2023-09-28 DIAGNOSIS — C787 Secondary malignant neoplasm of liver and intrahepatic bile duct: Secondary | ICD-10-CM | POA: Diagnosis present

## 2023-09-28 DIAGNOSIS — R4182 Altered mental status, unspecified: Secondary | ICD-10-CM | POA: Diagnosis not present

## 2023-09-28 DIAGNOSIS — G9341 Metabolic encephalopathy: Secondary | ICD-10-CM | POA: Diagnosis present

## 2023-09-28 DIAGNOSIS — R739 Hyperglycemia, unspecified: Secondary | ICD-10-CM

## 2023-09-28 DIAGNOSIS — E1122 Type 2 diabetes mellitus with diabetic chronic kidney disease: Secondary | ICD-10-CM | POA: Diagnosis present

## 2023-09-28 DIAGNOSIS — E785 Hyperlipidemia, unspecified: Secondary | ICD-10-CM | POA: Diagnosis present

## 2023-09-28 DIAGNOSIS — E860 Dehydration: Secondary | ICD-10-CM | POA: Diagnosis present

## 2023-09-28 DIAGNOSIS — A0472 Enterocolitis due to Clostridium difficile, not specified as recurrent: Secondary | ICD-10-CM | POA: Diagnosis present

## 2023-09-28 DIAGNOSIS — E861 Hypovolemia: Secondary | ICD-10-CM | POA: Diagnosis present

## 2023-09-28 DIAGNOSIS — N1832 Chronic kidney disease, stage 3b: Secondary | ICD-10-CM | POA: Diagnosis present

## 2023-09-28 DIAGNOSIS — R109 Unspecified abdominal pain: Secondary | ICD-10-CM | POA: Diagnosis not present

## 2023-09-28 DIAGNOSIS — R6510 Systemic inflammatory response syndrome (SIRS) of non-infectious origin without acute organ dysfunction: Secondary | ICD-10-CM | POA: Diagnosis not present

## 2023-09-28 DIAGNOSIS — E1140 Type 2 diabetes mellitus with diabetic neuropathy, unspecified: Secondary | ICD-10-CM | POA: Diagnosis present

## 2023-09-28 DIAGNOSIS — C782 Secondary malignant neoplasm of pleura: Secondary | ICD-10-CM | POA: Diagnosis present

## 2023-09-28 DIAGNOSIS — N179 Acute kidney failure, unspecified: Secondary | ICD-10-CM | POA: Diagnosis present

## 2023-09-28 LAB — COMPREHENSIVE METABOLIC PANEL
ALT: 87 U/L — ABNORMAL HIGH (ref 0–44)
AST: 213 U/L — ABNORMAL HIGH (ref 15–41)
Albumin: 2.2 g/dL — ABNORMAL LOW (ref 3.5–5.0)
Alkaline Phosphatase: 139 U/L — ABNORMAL HIGH (ref 38–126)
Anion gap: 8 (ref 5–15)
BUN: 50 mg/dL — ABNORMAL HIGH (ref 8–23)
CO2: 21 mmol/L — ABNORMAL LOW (ref 22–32)
Calcium: 8.5 mg/dL — ABNORMAL LOW (ref 8.9–10.3)
Chloride: 104 mmol/L (ref 98–111)
Creatinine, Ser: 2.49 mg/dL — ABNORMAL HIGH (ref 0.44–1.00)
GFR, Estimated: 19 mL/min — ABNORMAL LOW (ref >60)
Glucose, Bld: 314 mg/dL — ABNORMAL HIGH (ref 70–99)
Potassium: 4.4 mmol/L (ref 3.5–5.1)
Sodium: 133 mmol/L — ABNORMAL LOW (ref 135–145)
Total Bilirubin: 1 mg/dL (ref 0.0–1.2)
Total Protein: 6 g/dL — ABNORMAL LOW (ref 6.5–8.1)

## 2023-09-28 LAB — CBG MONITORING, ED
Glucose-Capillary: 274 mg/dL — ABNORMAL HIGH (ref 70–99)
Glucose-Capillary: 258 mg/dL — ABNORMAL HIGH (ref 70–99)
Glucose-Capillary: 211 mg/dL — ABNORMAL HIGH (ref 70–99)

## 2023-09-28 LAB — HEMOGLOBIN A1C
Hgb A1c MFr Bld: 7.4 % — ABNORMAL HIGH (ref 4.8–5.6)
Mean Plasma Glucose: 165.68 mg/dL

## 2023-09-28 LAB — HEPATITIS PANEL, ACUTE
HCV Ab: NONREACTIVE
Hep A IgM: NONREACTIVE
Hep B C IgM: NONREACTIVE
Hepatitis B Surface Ag: NONREACTIVE

## 2023-09-28 LAB — IRON AND TIBC
Iron: 28 ug/dL (ref 28–170)
Saturation Ratios: 25 % (ref 10.4–31.8)
TIBC: 113 ug/dL — ABNORMAL LOW (ref 250–450)
UIBC: 85 ug/dL

## 2023-09-28 LAB — FERRITIN: Ferritin: >7500 ng/mL — ABNORMAL HIGH (ref 11–307)

## 2023-09-28 LAB — AMMONIA: Ammonia: 36 umol/L — ABNORMAL HIGH (ref 9–35)

## 2023-09-28 LAB — CBC
HCT: 21.4 % — ABNORMAL LOW (ref 36.0–46.0)
Hemoglobin: 7.1 g/dL — ABNORMAL LOW (ref 12.0–15.0)
MCH: 26.1 pg (ref 26.0–34.0)
MCHC: 33.2 g/dL (ref 30.0–36.0)
MCV: 78.7 fL — ABNORMAL LOW (ref 80.0–100.0)
Platelets: 266 10*3/uL (ref 150–400)
RBC: 2.72 MIL/uL — ABNORMAL LOW (ref 3.87–5.11)
RDW: 14.6 % (ref 11.5–15.5)
WBC: 10.5 10*3/uL (ref 4.0–10.5)
nRBC: 0.7 % — ABNORMAL HIGH (ref 0.0–0.2)

## 2023-09-28 LAB — GLUCOSE, CAPILLARY
Glucose-Capillary: 160 mg/dL — ABNORMAL HIGH (ref 70–99)
Glucose-Capillary: 169 mg/dL — ABNORMAL HIGH (ref 70–99)
Glucose-Capillary: 170 mg/dL — ABNORMAL HIGH (ref 70–99)

## 2023-09-28 LAB — VITAMIN B12: Vitamin B-12: 3730 pg/mL — ABNORMAL HIGH (ref 180–914)

## 2023-09-28 LAB — MRSA NEXT GEN BY PCR, NASAL: MRSA by PCR Next Gen: NOT DETECTED

## 2023-09-28 LAB — FOLATE: Folate: 26.2 ng/mL (ref >5.9)

## 2023-09-28 LAB — BETA-HYDROXYBUTYRIC ACID: Beta-Hydroxybutyric Acid: 0.09 mmol/L (ref 0.05–0.27)

## 2023-09-28 MED ORDER — ALLOPURINOL 100 MG PO TABS
100.0000 mg | ORAL_TABLET | Freq: Every day | ORAL | Status: DC
  Administered 2023-09-28 – 2023-10-06 (×9): 100 mg via ORAL
  Filled 2023-09-28 (×9): qty 1

## 2023-09-28 MED ORDER — LETROZOLE 2.5 MG PO TABS
2.5000 mg | ORAL_TABLET | Freq: Every day | ORAL | Status: DC
  Administered 2023-09-28 – 2023-10-01 (×4): 2.5 mg via ORAL
  Filled 2023-09-28 (×5): qty 1

## 2023-09-28 MED ORDER — VANCOMYCIN HCL 750 MG/150ML IV SOLN: 750.0000 mg | INTRAVENOUS | Status: DC

## 2023-09-28 MED ORDER — SODIUM CHLORIDE 0.9 % IV SOLN
2.0000 g | INTRAVENOUS | Status: DC
  Administered 2023-09-28 – 2023-09-29 (×2): 2 g via INTRAVENOUS
  Filled 2023-09-28 (×2): qty 12.5

## 2023-09-28 MED ORDER — ALBUTEROL SULFATE (2.5 MG/3ML) 0.083% IN NEBU
2.5000 mg | INHALATION_SOLUTION | RESPIRATORY_TRACT | Status: DC | PRN
  Administered 2023-09-28 – 2023-09-29 (×3): 2.5 mg via RESPIRATORY_TRACT
  Filled 2023-09-28 (×3): qty 3

## 2023-09-28 MED ORDER — SODIUM CHLORIDE 0.9 % IV SOLN: INTRAVENOUS | Status: DC

## 2023-09-28 MED ORDER — AMLODIPINE BESYLATE 5 MG PO TABS
10.0000 mg | ORAL_TABLET | Freq: Every day | ORAL | Status: DC
  Administered 2023-09-28 – 2023-10-06 (×9): 10 mg via ORAL
  Filled 2023-09-28 (×9): qty 2

## 2023-09-28 MED ORDER — METRONIDAZOLE 500 MG/100ML IV SOLN
500.0000 mg | Freq: Two times a day (BID) | INTRAVENOUS | Status: DC
  Administered 2023-09-28 – 2023-09-29 (×3): 500 mg via INTRAVENOUS
  Filled 2023-09-28 (×3): qty 100

## 2023-09-28 MED ORDER — ACETAMINOPHEN 650 MG RE SUPP
650.0000 mg | Freq: Four times a day (QID) | RECTAL | Status: DC | PRN
  Administered 2023-09-29: 650 mg via RECTAL
  Filled 2023-09-28: qty 1

## 2023-09-28 MED ORDER — INSULIN ASPART 100 UNIT/ML IJ SOLN
0.0000 [IU] | INTRAMUSCULAR | Status: DC
  Administered 2023-09-28: 3 [IU] via SUBCUTANEOUS
  Administered 2023-09-28 (×2): 2 [IU] via SUBCUTANEOUS
  Administered 2023-09-28 (×2): 5 [IU] via SUBCUTANEOUS
  Administered 2023-09-29: 1 [IU] via SUBCUTANEOUS
  Administered 2023-09-29 (×5): 2 [IU] via SUBCUTANEOUS
  Administered 2023-09-30 (×2): 1 [IU] via SUBCUTANEOUS
  Administered 2023-09-30 – 2023-10-01 (×4): 2 [IU] via SUBCUTANEOUS
  Administered 2023-10-01: 3 [IU] via SUBCUTANEOUS
  Administered 2023-10-01: 1 [IU] via SUBCUTANEOUS
  Administered 2023-10-01: 3 [IU] via SUBCUTANEOUS
  Administered 2023-10-01 – 2023-10-02 (×3): 2 [IU] via SUBCUTANEOUS
  Administered 2023-10-02: 1 [IU] via SUBCUTANEOUS
  Administered 2023-10-02: 3 [IU] via SUBCUTANEOUS
  Administered 2023-10-02: 2 [IU] via SUBCUTANEOUS
  Administered 2023-10-02: 3 [IU] via SUBCUTANEOUS
  Administered 2023-10-03 (×2): 2 [IU] via SUBCUTANEOUS
  Administered 2023-10-03: 5 [IU] via SUBCUTANEOUS
  Administered 2023-10-03 (×3): 2 [IU] via SUBCUTANEOUS
  Administered 2023-10-04: 3 [IU] via SUBCUTANEOUS
  Administered 2023-10-04: 2 [IU] via SUBCUTANEOUS
  Administered 2023-10-04 (×2): 1 [IU] via SUBCUTANEOUS
  Administered 2023-10-04 – 2023-10-05 (×4): 2 [IU] via SUBCUTANEOUS
  Administered 2023-10-05 (×2): 1 [IU] via SUBCUTANEOUS
  Administered 2023-10-05 (×2): 2 [IU] via SUBCUTANEOUS
  Administered 2023-10-06 (×4): 1 [IU] via SUBCUTANEOUS
  Filled 2023-09-28: qty 0.09

## 2023-09-28 MED ORDER — CARVEDILOL 25 MG PO TABS
25.0000 mg | ORAL_TABLET | Freq: Two times a day (BID) | ORAL | Status: DC
  Administered 2023-09-28 – 2023-10-06 (×17): 25 mg via ORAL
  Filled 2023-09-28 (×12): qty 1
  Filled 2023-09-28: qty 2
  Filled 2023-09-28 (×4): qty 1

## 2023-09-28 MED ORDER — ACETAMINOPHEN 325 MG PO TABS
650.0000 mg | ORAL_TABLET | Freq: Four times a day (QID) | ORAL | Status: DC | PRN
  Administered 2023-09-28 – 2023-09-29 (×2): 650 mg via ORAL
  Filled 2023-09-28 (×2): qty 2

## 2023-09-28 MED ORDER — GABAPENTIN 100 MG PO CAPS
100.0000 mg | ORAL_CAPSULE | Freq: Three times a day (TID) | ORAL | Status: DC
Start: 2023-09-28 — End: 2023-10-06
  Administered 2023-09-28 – 2023-10-06 (×25): 100 mg via ORAL
  Filled 2023-09-28 (×25): qty 1

## 2023-09-28 NOTE — ED Provider Notes (Signed)
  Physical Exam  BP (!) 166/74   Pulse 78   Temp 98.2 F (36.8 C) (Oral)   Resp (!) 34   Ht 5\' 1"  (1.549 m)   Wt 71.2 kg   SpO2 95%   BMI 29.67 kg/m   Physical Exam  Procedures  Procedures  ED Course / MDM    Medical Decision Making Amount and/or Complexity of Data Reviewed Labs: ordered. Radiology: ordered.  Risk Prescription drug management. Decision regarding hospitalization.   Patient care assumed at shift handoff.  See previous providers note for full details.  In short, 78 year old female patient with past medical history significant for CVA, diabetes, hypertension, malignant breast cancer with multisystem metastases presented to the emergency room a concern of weakness.  Patient was found to be febrile And there were concerns over possible sepsis.  Patient was treated with broad-spectrum antibiotics including Rocephin, Flagyl, vancomycin.  White count of 11,700, hemoglobin 7.6 (dropped from 9.8 1 week ago).  Patient also with AST of 235, last week was 117, creatinine 2.60 increased from 1.94 days ago.  Plan for admission after CT abdomen pelvis results.  1. No convincing evidence for bowel obstruction or bowel  inflammatory process.  2. Increased abdominopelvic ascites compared to the most recent  prior  3. Small left-sided pleural effusion with pleural nodularity  consistent with metastatic disease. Numerous hepatic metastatic  lesions as seen on the prior exam  4. Lobulated left posterior pelvic soft tissue density possibly  prominent left ovary, could be correlated with nonemergent pelvic  ultrasound if desired.  5. Incompletely visualized left outer breast mass.   Unclear cause of patient's fever. Liver enzymes elevated likely in setting of metastatic disease.  Hospitalist admission requested.  I spoke with Dr.Rathore who agreed to see the patient for admission.      Darrick Grinder, PA-C 09/28/23 0133    Rozelle Logan, DO 09/30/23 1517

## 2023-09-28 NOTE — H&P (Signed)
 History and Physical    Heather Rogers ZOX:[id number] DOB: Mar 25, 1946 DOA: 09/27/2023  PCP: [name], MD  Patient coming from: Home  Chief Complaint: Generalized weakness  HPI: Heather Rogers is a 78 y.o. female with medical history significant of metastatic breast cancer on active treatment with [name] and letrozole, malignant left pleural effusion, CKD stage IIIb, type 2 diabetes, hypertension, history of CVA, anemia of chronic disease, hyperlipidemia presented to the ED for evaluation of fever, confusion, and generalized weakness.  Vital signs on arrival to the ED: Temperature 103.2 F, pulse 82, respiratory rate 24, blood pressure 134/85, and SpO2 96% on room air.  Labs showing WBC count 11.7, hemoglobin 7.6 (baseline 9-10), MCV 78.2, FOBT negative, sodium 132, bicarb 18, anion gap 11, glucose 387, BUN 54, creatinine 2.6 (baseline 1.4), AST 235, ALT 96, alk phos 149, T. bili 0.8, INR 1.3, lactate normal, blood cultures collected, COVID/influenza/RSV PCR negative, UA not suggestive of infection. Chest x-ray showing no active disease.  CT abdomen pelvis negative for acute infectious process. Patient was given Tylenol, vancomycin, ceftriaxone, metronidazole, and 2.25 L LR boluses.  TRH called to admit.  Patient is reporting generalized weakness, cough, and poor p.o. intake.  She is not sure how many days she has felt sick.  Unclear if she had shortness of breath.  She denies chest pain.  Denies any urinary symptoms.  She is endorsing loose stools but denies abdominal pain.  Denies recent sick contacts.  Denies headaches or neck pain/stiffness.  Denies skin rash.  She does not have a Port-A-Cath.  Reporting dark stools but does take an iron supplement at home.  Denies alcohol use.  Review of Systems:  Review of Systems  All other systems reviewed and are negative.   Past Medical History:  Diagnosis Date   Chronic kidney disease, stage 3b (HCC)    Diabetes (HCC)    Hypertension     Pneumonia due to COVID-19 virus 07/25/2020   Primary malignant neoplasm of breast with metastasis (HCC) 09/14/2021   Stroke (cerebrum) (HCC)    Stroke (HCC) 05/15/2018    Past Surgical History:  Procedure Laterality Date   CHEST TUBE INSERTION Left 09/07/2021   Procedure: CHEST TUBE INSERTION;  Surgeon: Lorin Glass, MD;  Location: [facility] ENDOSCOPY;  Service: Pulmonary;  Laterality: Left;   CHEST TUBE INSERTION N/A 03/19/2022   Procedure: REMOVAL PLEURAL DRAINAGE CATHETER;  Surgeon: Omar Person, MD;  Location: [facility] ENDOSCOPY;  Service: Pulmonary;  Laterality: N/A;   EYE SURGERY Left 04/06/2020   Implant placed   EYE SURGERY Right 03/30/2020   Implant placed    hysterectomy     LOOP RECORDER INSERTION N/A 05/20/2018   Procedure: LOOP RECORDER INSERTION;  Surgeon: Hillis Range, MD;  Location: [facility] INVASIVE CV LAB;  Service: Cardiovascular;  Laterality: N/A;   TEE WITHOUT CARDIOVERSION N/A 05/20/2018   Procedure: TRANSESOPHAGEAL ECHOCARDIOGRAM (TEE);  Surgeon: Quintella Reichert, MD;  Location: [facility] ENDOSCOPY;  Service: Cardiovascular;  Laterality: N/A;     reports that she quit smoking about 38 years ago. Her smoking use included cigarettes. She started smoking about 53 years ago. She has a 15 pack-year smoking history. She has never used smokeless tobacco. She reports that she does not drink alcohol and does not use drugs.  Allergies  Allergen Reactions   Sulfa Antibiotics Itching    Family History  Problem Relation Age of Onset   Hypertension Mother    Stroke Mother    Hypertension Father  Prior to Admission medications   Medication Sig Start Date End Date Taking? Authorizing Provider  allopurinol (ZYLOPRIM) 100 MG tablet Take 1 tablet (100 mg total) by mouth daily. 02/19/23  Yes [name], MD  amLODipine (NORVASC) 10 MG tablet Take 1 tablet (10 mg total) by mouth daily. 02/19/23  Yes [name], MD  aspirin EC 81 MG tablet Take 81 mg by mouth in the morning.  Swallow whole.   Yes [provider]  atorvastatin (LIPITOR) 80 MG tablet TAKE 1 TABLET EVERY EVENING 04/22/23  Yes [name], MD  carvedilol (COREG) 25 MG tablet Take 1 tablet (25 mg total) by mouth 2 (two) times daily with a meal. 02/19/23  Yes Newlin, Enobong, MD  cefadroxil (DURICEF) 500 MG capsule Take 1 capsule (500 mg total) by mouth 2 (two) times daily. 09/24/23  Yes Prosperi, Christian H, PA-C  cholecalciferol (VITAMIN D3) 25 MCG (1000 UNIT) tablet Take 1,000 Units by mouth daily.   Yes [provider]  ferrous sulfate 325 (65 FE) MG EC tablet Take 1 tablet (325 mg total) by mouth in the morning and at bedtime. 05/22/21  Yes Mayers, Cari S, PA-C  gabapentin (NEURONTIN) 100 MG capsule Take 100 mg by mouth 3 (three) times daily.   Yes [provider]  letrozole (FEMARA) 2.5 MG tablet TAKE 1 TABLET BY MOUTH DAILY 01/11/23  Yes Serena Croissant, MD  metFORMIN (GLUCOPHAGE) 500 MG tablet Take 1 tablet (500 mg total) by mouth daily with breakfast. 08/22/23  Yes [name], MD  Multiple Vitamin (MULTIVITAMIN WITH MINERALS) TABS tablet Take 1 tablet by mouth daily. 09/09/21  Yes Ghimire, Werner Lean, MD  ondansetron (ZOFRAN) 8 MG tablet TAKE 1 TABLET BY MOUTH EVERY 8 HOURS AS NEEDED FOR NAUSEA AND/OR VOMITING 06/24/23  Yes Serena Croissant, MD  polyethylene glycol (MIRALAX / GLYCOLAX) 17 g packet Take 17 g by mouth daily as needed (constipation.).   Yes Serena Croissant, MD  [name] 50 MG tablet TAKE 1 TABLET BY MOUTH TWO TIMES DAILY. SWALLOW WHOLE TABLETS; DO NOT CRUSH, CHEW, OR SPLIT TABLETS BEFORE SWALLOWING. 04/26/23  Yes Serena Croissant, MD  Accu-Chek FastClix Lancets MISC USE TO CHECK FASTING BLOOD SUGAR AND BEFORE MEALS AND AGAIN IF PATIENT FEELS BAD (SYMPTOMS OF HYPO) AS DIRECTED BY YOUR PRESCRIBER 10/12/22   [name], MD  Blood Glucose Monitoring Suppl (ACCU-CHEK GUIDE) w/Device KIT 1 each by Does not apply route as directed. 08/25/18   Fulp, Cammie, MD  gabapentin  (NEURONTIN) 100 MG capsule Take 1 capsule (100 mg total) by mouth 3 (three) times daily. 02/19/23   [name], MD  glucose blood (ACCU-CHEK GUIDE) test strip USE TO CHECK FASTING BLOOD SUGAR AND BEFORE MEALS AND AGAIN IF PATIENT FEELS BAD; SYMPTOMS OF HYPOGLYCEMIA 09/20/21   [name], MD    Physical Exam: Vitals:   09/28/23 0009 09/28/23 0020 09/28/23 0040 09/28/23 0100  BP:  138/60  135/62  Pulse:  72  70  Resp:  (!) 26  18  Temp: 98.2 F (36.8 C)  98.4 F (36.9 C)   TempSrc: Oral  Oral   SpO2:  92%  94%  Weight:      Height:        Physical Exam Vitals reviewed.  Constitutional:      General: She is not in acute distress. HENT:     Head: Normocephalic and atraumatic.  Eyes:     Extraocular Movements: Extraocular movements intact.  Cardiovascular:     Rate and Rhythm: Normal  rate and regular rhythm.     Pulses: Normal pulses.  Pulmonary:     Effort: Pulmonary effort is normal. No respiratory distress.     Breath sounds: No wheezing, rhonchi or rales.  Abdominal:     General: Bowel sounds are normal. There is distension.     Palpations: Abdomen is soft.     Tenderness: There is no abdominal tenderness. There is no guarding.  Musculoskeletal:     Cervical back: Normal range of motion. No rigidity.     Right lower leg: No edema.     Left lower leg: No edema.  Skin:    General: Skin is warm and dry.  Neurological:     General: No focal deficit present.     Mental Status: She is alert and oriented to person, place, and time.     Cranial Nerves: No cranial nerve deficit.     Sensory: No sensory deficit.     Motor: No weakness.     Labs on Admission: I have personally reviewed following labs and imaging studies  CBC: Recent Labs  Lab 09/24/23 1031 09/27/23 2023  WBC 8.6 11.7*  NEUTROABS  --  9.0*  HGB 8.7* 7.6*  HCT 26.9* 22.6*  MCV 81.5 78.2*  PLT 258 290   Basic Metabolic Panel: Recent Labs  Lab 09/24/23 1031 09/27/23 2023  NA 135 132*  K  4.8 4.6  CL 102 103  CO2 20* 18*  GLUCOSE 208* 387*  BUN 35* 54*  CREATININE 1.90* 2.63*  CALCIUM 9.8 8.8*   GFR: Estimated Creatinine Clearance: 16.2 mL/min (A) (by C-G formula based on SCr of 2.63 mg/dL (H)). Liver Function Tests: Recent Labs  Lab 09/27/23 2023  AST 235*  ALT 96*  ALKPHOS 149*  BILITOT 0.8  PROT 6.7  ALBUMIN 2.6*   No results for input(s): "LIPASE", "AMYLASE" in the last 168 hours. No results for input(s): "AMMONIA" in the last 168 hours. Coagulation Profile: Recent Labs  Lab 09/27/23 2023  INR 1.3*   Cardiac Enzymes: No results for input(s): "CKTOTAL", "CKMB", "CKMBINDEX", "TROPONINI" in the last 168 hours. BNP (last 3 results) No results for input(s): "PROBNP" in the last 8760 hours. HbA1C: No results for input(s): "HGBA1C" in the last 72 hours. CBG: Recent Labs  Lab 09/27/23 2021  GLUCAP 356*   Lipid Profile: No results for input(s): "CHOL", "HDL", "LDLCALC", "TRIG", "CHOLHDL", "LDLDIRECT" in the last 72 hours. Thyroid Function Tests: No results for input(s): "TSH", "T4TOTAL", "FREET4", "T3FREE", "THYROIDAB" in the last 72 hours. Anemia Panel: Recent Labs    09/27/23 2239  VITAMINB12 3,730*  FOLATE 26.2  FERRITIN >7,500*  TIBC 113*  IRON 28  RETICCTPCT 0.6   Urine analysis:    Component Value Date/Time   COLORURINE AMBER (A) 09/27/2023 2142   APPEARANCEUR CLOUDY (A) 09/27/2023 2142   LABSPEC 1.023 09/27/2023 2142   PHURINE 5.0 09/27/2023 2142   GLUCOSEU 50 (A) 09/27/2023 2142   HGBUR SMALL (A) 09/27/2023 2142   BILIRUBINUR NEGATIVE 09/27/2023 2142   KETONESUR NEGATIVE 09/27/2023 2142   PROTEINUR 100 (A) 09/27/2023 2142   UROBILINOGEN 0.2 12/20/2009 1049   NITRITE NEGATIVE 09/27/2023 2142   LEUKOCYTESUR NEGATIVE 09/27/2023 2142    Radiological Exams on Admission: CT ABDOMEN PELVIS WO CONTRAST Result Date: 09/27/2023 CLINICAL DATA:  Possible bowel obstruction lethargy weakness EXAM: CT ABDOMEN AND PELVIS WITHOUT CONTRAST  TECHNIQUE: Multidetector CT imaging of the abdomen and pelvis was performed following the standard protocol without IV contrast. RADIATION DOSE REDUCTION: This  exam was performed according to the departmental dose-optimization program which includes automated exposure control, adjustment of the mA and/or kV according to patient size and/or use of iterative reconstruction technique. COMPARISON:  CT 09/20/2023 FINDINGS: Lower chest: Lung bases demonstrate small left-sided pleural effusion and mild pleural nodularity as seen on the prior exam. Skin thickening left breast with partially visualized mass in the lateral aspect of left breast. Small adjacent low lateral lymph nodes measuring up to 7 mm. Hepatobiliary: Numerous hypodense liver lesions corresponding to history of metastatic disease. Probable small gallstones. No biliary dilatation Pancreas: Unremarkable. No pancreatic ductal dilatation or surrounding inflammatory changes. Spleen: Normal in size without focal abnormality. Adrenals/Urinary Tract: Stable adrenal glands. No hydronephrosis. The bladder is unremarkable Stomach/Bowel: The stomach is nonenlarged. There is no dilated small bowel. Some air distension of colon but no obstruction. No acute bowel wall thickening Vascular/Lymphatic: Advanced aortic atherosclerosis. No aneurysm. Gastrohepatic lymph nodes measuring up to 11 mm. Reproductive: Hysterectomy. Lobulated soft tissue density in the left posterior pelvis suspected to represent ovary, this measures 3.7 x 2.4 cm on series 2, image 66. Other: No free air. Increased abdominopelvic ascites compared to the CT performed several days prior. Musculoskeletal: No acute or suspicious osseous abnormality. IMPRESSION: 1. No convincing evidence for bowel obstruction or bowel inflammatory process. 2. Increased abdominopelvic ascites compared to the most recent prior 3. Small left-sided pleural effusion with pleural nodularity consistent with metastatic disease.  Numerous hepatic metastatic lesions as seen on the prior exam 4. Lobulated left posterior pelvic soft tissue density possibly prominent left ovary, could be correlated with nonemergent pelvic ultrasound if desired. 5. Incompletely visualized left outer breast mass. Electronically Signed   By: Jasmine Pang M.D.   On: 09/27/2023 23:53   DG Chest Port 1 View Result Date: 09/27/2023 CLINICAL DATA:  Increased lethargy and weakness, initial encounter EXAM: PORTABLE CHEST 1 VIEW COMPARISON:  09/24/2023 FINDINGS: Cardiac shadow is stable. Loop recorder is again noted. Aortic calcifications are seen. The lungs are well aerated without focal infiltrate. Previously seen left effusion has resolved. No bony abnormality is noted. IMPRESSION: No active disease. Electronically Signed   By: Alcide Clever M.D.   On: 09/27/2023 21:46    EKG: Independently reviewed.  Sinus rhythm, no significant change since previous tracing.  Assessment and Plan  Fever/SIRS Met SIRS criteria at the time of presentation with fever and slight tachypnea. WBC count borderline elevated.  No tachycardia, hypotension, or lactic acidosis to suggest sepsis.  No infectious source identified on imaging of chest and abdomen/pelvis.  Has ascites but no abdominal pain or tenderness to suggest SBP.  UA not suggestive of infection.  COVID/influenza/RSV PCR negative.  No other obvious infectious source at this time.  Continue broad-spectrum antibiotic coverage with vancomycin, cefepime, and metronidazole. Trend lactate and WBC count.  Follow-up blood cultures and will add on urine culture.  Acute metabolic encephalopathy Patient appears only mildly confused at this time but otherwise following commands and answering questions appropriately.  No nuchal rigidity to suggest meningitis.  No focal neurodeficit on exam but does have history of previous stroke.  As such, will obtain stat CT head.  AKI on CKD stage IIIb Likely prerenal from dehydration.  Creatinine currently 2.6 and baseline 1.4.  CT without evidence of obstructive uropathy.  Continue IV fluid hydration and monitor renal function.  Avoid nephrotoxic agents.  Elevated LFTs AST 235, ALT 96, alk phos 149, T. bili 0.8.  This is likely related to hepatic metastases as seen on  CT.  No history of alcohol abuse.  Acute hepatitis panel ordered and monitor LFTs.  Hyperglycemia Type 2 diabetes Patient is not on insulin and last A1c 6.7 on 08/22/2023.  Glucose in the 380s, bicarb 18. ?Early DKA but anion gap is normal.  Placed on sensitive sliding scale insulin every 4 hours for now.  Check beta hydroxybutyric acid level.  Acute on chronic anemia Hemoglobin currently 7.6 and baseline and is in the 9-10 range.  FOBT negative. Type and screen and continue to monitor H&H.  Transfuse PRBCs if hemoglobin less than 7.  Ascites CT showing increased abdominopelvic ascites.  Likely malignant ascites given known metastatic breast cancer with hepatic mets. ?SBP given fever but no abdominal pain or tenderness.  Already on broad-spectrum antibiotics.  She will need paracentesis.  Mild hyponatremia Continue IV fluid hydration and monitor labs.  Generalized weakness PT/OT eval, fall precautions.  Abnormal imaging finding CT abdomen pelvis showing lobulated left posterior pelvic soft tissue density suspicious for possible prominent left ovary. Patient will need nonemergent pelvic ultrasound for further evaluation.  Metastatic breast cancer Per pharmacy med rec, patient is currently on letrozole but [name] is on hold.  Hypertension Continue amlodipine and Coreg.  History of CVA Hold aspirin until CT head is done.  Hold statin at this time due to elevated LFTs.  Hyperlipidemia Hold statin at this time and monitor LFTs.  Chronic pain/neuropathy Continue gabapentin.  Gout Continue allopurinol.  DVT prophylaxis: SCDs Code Status: DNR (discussed with the patient. Family Communication: No  family available at this time. Level of care: Progressive Care Unit Admission status: It is my clinical opinion that admission to INPATIENT is reasonable and necessary because of the expectation that this patient will require hospital care that crosses at least 2 midnights to treat this condition based on the medical complexity of the problems presented.  Given the aforementioned information, the predictability of an adverse outcome is felt to be significant.  John Giovanni MD [facility]  If 7PM-7AM, please contact night-coverage [email or web address]  09/28/2023, 1:33 AM

## 2023-09-28 NOTE — Telephone Encounter (Signed)
 Post ED Visit - Positive Culture Follow-up  Culture report reviewed by antimicrobial stewardship pharmacist: [facility] []  Enzo Bi, Pharm.D. []  Celedonio Miyamoto, Pharm.D., BCPS AQ-ID []  Garvin Fila, Pharm.D., BCPS []  Georgina Pillion, Pharm.D., BCPS []  [employer], [address].D., BCPS, AAHIVP []  Estella Husk, Pharm.D., BCPS, AAHIVP []  Lysle Pearl, PharmD, BCPS []  Phillips Climes, PharmD, BCPS []  Agapito Games, PharmD, BCPS []  Verlan Friends, PharmD []  Mervyn Gay, PharmD, BCPS [x]  Delmar Landau, PharmD  [facility] []  Len Childs, PharmD []  Greer Pickerel, PharmD []  Adalberto Cole, PharmD []  Perlie Gold, Rph []  Lonell Face) Jean Rosenthal, PharmD []  Earl Many, PharmD []  Junita Push, PharmD []  Dorna Leitz, PharmD []  Terrilee Files, PharmD []  Lynann Beaver, PharmD []  Keturah Barre, PharmD []  Loralee Pacas, PharmD []  Bernadene Person, PharmD   Positive urine culture Treated with Cefadroxil, organism sensitive to the same and no further patient follow-up is required at this time.  Patsey Berthold 09/28/2023, 1:09 PM

## 2023-09-28 NOTE — Progress Notes (Signed)
 Pharmacy Antibiotic Note  Heather Rogers is a 78 y.o. female admitted on 09/27/2023 with sepsis.  Pharmacy has been consulted for Cefepime & Vancomycin dosing.  -She was seen in ED on 2/25 and diagnosed with UTI.  She was discharged on cefadroxil which she has been taking.  -PMH significant for metastatic breast cancer for which she is currently receiving treatment. -AKI on admission; baseline Scr ~1.5  Plan: Cefepime 2gm IV q24h Vancomycin 750mg  IV q48h to target AUC 400-550.  Estimated AUC on this regimen = 468. Monitor renal function and cx data     Height: 5\' 1"  (154.9 cm) Weight: 71.2 kg (157 lb) IBW/kg (Calculated) : 47.8  Temp (24hrs), Avg:99.9 F (37.7 C), Min:98.2 F (36.8 C), Max:103.2 F (39.6 C)  Recent Labs  Lab 09/24/23 1031 09/27/23 2023 09/27/23 2037  WBC 8.6 11.7*  --   CREATININE 1.90* 2.63*  --   LATICACIDVEN  --   --  1.7    Estimated Creatinine Clearance: 16.2 mL/min (A) (by C-G formula based on SCr of 2.63 mg/dL (H)).    Allergies  Allergen Reactions   Sulfa Antibiotics Itching    Antimicrobials this admission: 2/28 Rocephin x1 dose 2/28 Flagyl >>  3/1 Vancomycin >>  3/1 Cefepime >>  Dose adjustments this admission:  Microbiology results: 2/28 BCx:  2/28 Resp PCR: negative  2/25 UCx: 20K Ecoli (pan-sensitive)   Thank you for allowing pharmacy to be a part of this patient's care.  Junita Push PharmD 09/28/2023 3:21 AM

## 2023-09-28 NOTE — ED Notes (Signed)
 Pt stated she finished having a bowel movement. I went to change pt brief. Pt brief noted to be clean and dry at this time.

## 2023-09-28 NOTE — Progress Notes (Signed)
 PT Cancellation Note  Patient Details Name: [name] MRN: [id number] DOB: 1946/07/23   Cancelled Treatment:    Reason Eval/Treat Not Completed: Patient not medically ready  Patient has orders for US paracentesis, has been inpat < 24hrs. Will follow up 09/29/23. Blanchard Kelch PT Acute Rehabilitation Services Office [phone]   Rada Hay 09/28/2023, 3:09 PM

## 2023-09-28 NOTE — ED Notes (Signed)
 Patient transported to CT

## 2023-09-28 NOTE — Progress Notes (Addendum)
 PROGRESS NOTE    Heather Rogers  ZOX:[id number] DOB: [birth year] DOA: 09/27/2023 PCP: [name], MD    Brief Narrative:   Heather Rogers is a 78 y.o. female with past medical history significant for breast cancer with metastasis to liver, lung, bone with concern for thyroid involvement with nodule, DM2, HTN, gout, history of CVA, anemia of chronic kidney disease, HLD who presented to [facility] ED on 09/27/2023 from home via EMS with reports of increased weakness, lethargy.  Was recently prescribed antibiotics for UTI.  Patient also endorses loose stools but denies abdominal pain.  Stools reported dark but has been taking an iron supplement at home.  No recent sick contacts, no headache, no neck pain/stiffness, no skin lesions/rash.  Patient does not have a Port-A-Cath but does have an implanted loop recorder that is currently not operable.  In the ED, temperature 103.2 F, HR 82, RR 24, BP 134/85, SpO2 96% on room air.  WBC 11.7, hemoglobin 7.6, platelet count 290.  Sodium 132, potassium 4.6, chloride 103, CO2 18, glucose 387, BUN 54, creatinine 2.63, AST 235, ALT 96, total bilirubin 0.8.  Alkaline phosphatase 149.  Lactic acid 1.7.  INR 1.3.  COVID/influenza/RSV PCR negative.  Urinalysis unrevealing.  CT head without contrast with no acute finding.  CT abdomen/pelvis without contrast with no evidence of bowel obstruction or bowel inflammatory process, increased abdominal pelvic ascites, paired to recent study, small left pleural effusion with pleural nodularity consistent with metastatic disease, numerous hepatic metastatic lesions as seen on prior exam, lobulated left posterior pelvic soft tissue density probably prominent left ovary, noted left outer breast mass. Patient was given Tylenol, vancomycin, ceftriaxone, metronidazole, and 2.25 L LR boluses. TRH called to admit.   Assessment & Plan:   Sepsis of unclear etiology Met SIRS criteria at the time of presentation with  fever and slight tachypnea.  Recently diagnosed with UTI and started on outpatient antibiotics.  WBC count borderline elevated.  No tachycardia, hypotension, or lactic acidosis to suggest sepsis.  No infectious source identified on imaging of chest and abdomen/pelvis.  Has ascites but no abdominal pain or tenderness to suggest SBP.  UA not suggestive of infection.  COVID/influenza/RSV PCR negative.  Chest x-ray with no acute cardiopulmonary disease process.  No other obvious infectious source at this time.  -- Reports of diarrhea and recent outpatient antibiotic use, check C. difficile PCR, GI PCR -- Blood cultures x 2: Pending -- Vancomycin, pharmacy consult for dosing/monitoring -- Cefepime 2 g IV every 24 hours -- Metronidazole 500 mg IV every 12 hours -- Check MRSA PCR, if negative will discontinue vancomycin -- CBC daily; monitor fever curve  Acute metabolic encephalopathy Patient presenting with confusion; but able to follow commands and answer questions appropriately.  No nuchal rigidity suggest meningitis.  No focal neurological deficit on physical exam.  CT head without contrast with no acute findings. -- Check ammonia level given elevated LFTs -- SLP for swallow evaluation --Aspiration/fall precautions -- Continue supportive care  AKI on CKD stage IIIb Likely prerenal from dehydration. Creatinine currently 2.6 and baseline 1.4 - 1.7.  CT without evidence of obstructive uropathy.   -- Cr 2.63>2.49 -- Avoid nephrotoxins, renal dose all medications -- continue IVF w/ NS at 75 mL/h -- BMP daily  Elevated LFTs AST 235, ALT 96, alk phos 149, T. bili 0.8.  This is likely related to hepatic metastases as seen on CT.  No history of alcohol abuse.  Acute hepatitis panel negative. --  AST 235>213 -- ALT 96>87 -- Tbili 0.8>1.0 -- CMP daily  Mild hyponatremia Sodium 132 on admission.  Etiology likely secondary to hypovolemic hyponatremia in the setting of poor oral intake in days preceding  hospitalization. -- Na 132>133 -- NS at 75 mL/h  Ascites CT showing increased abdominopelvic ascites.  Likely malignant ascites given known metastatic breast cancer with hepatic mets. ?SBP given fever but no abdominal pain or tenderness.  Already on broad-spectrum antibiotics.   -- US paracentesis ordered    Generalized weakness -- Pending PT/OT evaluation -- Fall precautions   Abnormal imaging finding CT abdomen pelvis showing lobulated left posterior pelvic soft tissue density suspicious for possible prominent left ovary.  -- Outpatient nonemergent pelvic ultrasound for further evaluation.  Breast cancer with metastasis to liver, bone, lung Follows with medical oncology, Dr. Pamelia Hoit.  Last seen in office 06/13/2023.  Outpatient scans showing progression of metastasis.  Has been on treatment with [name] with letrozole over the past year.   Type 2 diabetes mellitus, with hyperglycemia Hemoglobin A1c 7.4.  On metformin 5 mg p.o. daily at baseline. -- Hold oral hypoglycemics while inpatient -- Sensitive SSI for coverage -- CBGs q4h while NPO  Essential hypertension -- Amlodipine 10 mg p.o. daily -- Carvedilol 25 mg p.o. twice daily  Hyperlipidemia -- Hold home statin secondary to elevated LFTs  Anemia of chronic medical/renal disease Hemoglobin currently 7.6 and baseline and is in the 9-10 range.  FOBT negative.  Anemia panel with iron 28, TIBC 113, ferritin greater than 7500, folate 26.6, vitamin B12 3730.   -- Transfuse for hemoglobin less than 7.0 t  History of CVA -- Hold aspirin in the setting of anemia  Gout -- Continue allopurinol 100 mg POP daily  Chronic pain/neuropathy -- Gabapentin 100 mg PO TID   DVT prophylaxis: SCDs Start: 09/28/23 0307    Code Status: Do not attempt resuscitation (DNR) PRE-ARREST INTERVENTIONS DESIRED Family Communication: Updated daughter present at bedside  Disposition Plan:  Level of care: Progressive Status is: Inpatient Remains  inpatient appropriate because: IV antibiotics    Consultants:  None  Procedures:  Ultrasound paracentesis: Pending  Antimicrobials:  Vancomycin 2/28>> Cefepime 2/28>> Metronidazole 2/28>> Ceftriaxone 2/28 - 2/28   Subjective: Patient seen examined bedside, lying in bed.  Remains in ED holding area.  Daughter present at bedside.  Patient continues with mild confusion, although more alert today per family.  No further fevers since initial presentation.  Discussed with daughter tests and lab results with no indication for focused infectious process going on.  Although she has had progression of her metastatic disease which could be a contributing factor as well as increased ascites although patient not complaining of abdominal pain.  Additionally patient reports some diarrhea after starting antibiotics outpatient for UTI.  Patient does endorse some mild shortness of breath.  No other specific complaints, concerns or questions at this time.  Denies headache, no chest pain, no palpitations, no abdominal pain, no current fever, no chills/night sweats, no nausea/vomiting.  No acute events overnight per nursing staff.  Objective: Vitals:   09/28/23 0830 09/28/23 0900 09/28/23 0930 09/28/23 1000  BP: (!) 151/71 (!) 162/74 (!) 145/59 123/81  Pulse: 73 80 86 89  Resp:    16  Temp:      TempSrc:      SpO2: 98% 98% 99% 100%  Weight:      Height:        Intake/Output Summary (Last 24 hours) at 09/28/2023 1122 Last data filed  at 09/28/2023 1109 Gross per 24 hour  Intake 1230.87 ml  Output 1 ml  Net 1229.87 ml   Filed Weights   09/27/23 2015  Weight: 71.2 kg    Examination:  Physical Exam: GEN: NAD, alert and oriented x 3, chronically ill appearance, appears older than stated age HEENT: NCAT, PERRL, EOMI, sclera clear, MMM PULM: CTAB w/o wheezes/crackles, normal respiratory effort, on room air CV: RRR w/o M/G/R GI: abd soft, NTND, NABS, no R/G/M MSK: no peripheral edema, moves all  extremities dependently NEURO: No focal neurological deficits PSYCH: normal mood/affect Integumentary: No concerning rashes/lesions/wounds noted on exposed skin surfaces.    Data Reviewed: I have personally reviewed following labs and imaging studies  CBC: Recent Labs  Lab 09/24/23 1031 09/27/23 2023 09/28/23 0344  WBC 8.6 11.7* 10.5  NEUTROABS  --  9.0*  --   HGB 8.7* 7.6* 7.1*  HCT 26.9* 22.6* 21.4*  MCV 81.5 78.2* 78.7*  PLT 258 290 266   Basic Metabolic Panel: Recent Labs  Lab 09/24/23 1031 09/27/23 2023 09/28/23 0344  NA 135 132* 133*  K 4.8 4.6 4.4  CL 102 103 104  CO2 20* 18* 21*  GLUCOSE 208* 387* 314*  BUN 35* 54* 50*  CREATININE 1.90* 2.63* 2.49*  CALCIUM 9.8 8.8* 8.5*   GFR: Estimated Creatinine Clearance: 17.1 mL/min (A) (by C-G formula based on SCr of 2.49 mg/dL (H)). Liver Function Tests: Recent Labs  Lab 09/27/23 2023 09/28/23 0344  AST 235* 213*  ALT 96* 87*  ALKPHOS 149* 139*  BILITOT 0.8 1.0  PROT 6.7 6.0*  ALBUMIN 2.6* 2.2*   No results for input(s): "LIPASE", "AMYLASE" in the last 168 hours. No results for input(s): "AMMONIA" in the last 168 hours. Coagulation Profile: Recent Labs  Lab 09/27/23 2023  INR 1.3*   Cardiac Enzymes: No results for input(s): "CKTOTAL", "CKMB", "CKMBINDEX", "TROPONINI" in the last 168 hours. BNP (last 3 results) No results for input(s): "PROBNP" in the last 8760 hours. HbA1C: Recent Labs    09/28/23 0344  HGBA1C 7.4*   CBG: Recent Labs  Lab 09/27/23 2021 09/28/23 0410 09/28/23 0836  GLUCAP 356* 274* 258*   Lipid Profile: No results for input(s): "CHOL", "HDL", "LDLCALC", "TRIG", "CHOLHDL", "LDLDIRECT" in the last 72 hours. Thyroid Function Tests: No results for input(s): "TSH", "T4TOTAL", "FREET4", "T3FREE", "THYROIDAB" in the last 72 hours. Anemia Panel: Recent Labs    09/27/23 2239  VITAMINB12 3,730*  FOLATE 26.2  FERRITIN >7,500*  TIBC 113*  IRON 28  RETICCTPCT 0.6   Sepsis  Labs: Recent Labs  Lab 09/27/23 2037  LATICACIDVEN 1.7    Recent Results (from the past 240 hours)  Resp panel by RT-PCR (RSV, Flu A&B, Covid) Anterior Nasal Swab     Status: None   Collection Time: 09/20/23  1:55 PM   Specimen: Anterior Nasal Swab  Result Value Ref Range Status   SARS Coronavirus 2 by RT PCR NEGATIVE NEGATIVE Final    Comment: (NOTE) SARS-CoV-2 target nucleic acids are NOT DETECTED.  The SARS-CoV-2 RNA is generally detectable in upper respiratory specimens during the acute phase of infection. The lowest concentration of SARS-CoV-2 viral copies this assay can detect is 138 copies/mL. A negative result does not preclude SARS-Cov-2 infection and should not be used as the sole basis for treatment or other patient management decisions. A negative result may occur with  improper specimen collection/handling, submission of specimen other than nasopharyngeal swab, presence of viral mutation(s) within the areas targeted by this  assay, and inadequate number of viral copies(<138 copies/mL). A negative result must be combined with clinical observations, patient history, and epidemiological information. The expected result is Negative.  Fact Sheet for Patients:  BloggerCourse.com  Fact Sheet for Healthcare Providers:  SeriousBroker.it  This test is no t yet approved or cleared by the [place] FDA and  has been authorized for detection and/or diagnosis of SARS-CoV-2 by FDA under an Emergency Use Authorization (EUA). This EUA will remain  in effect (meaning this test can be used) for the duration of the COVID-19 declaration under Section 564(b)(1) of the Act, 21 U.S.C.section 360bbb-3(b)(1), unless the authorization is terminated  or revoked sooner.       Influenza A by PCR NEGATIVE NEGATIVE Final   Influenza B by PCR NEGATIVE NEGATIVE Final    Comment: (NOTE) The Xpert Xpress SARS-CoV-2/FLU/RSV plus assay is  intended as an aid in the diagnosis of influenza from Nasopharyngeal swab specimens and should not be used as a sole basis for treatment. Nasal washings and aspirates are unacceptable for Xpert Xpress SARS-CoV-2/FLU/RSV testing.  Fact Sheet for Patients: BloggerCourse.com  Fact Sheet for Healthcare Providers: SeriousBroker.it  This test is not yet approved or cleared by the [place] FDA and has been authorized for detection and/or diagnosis of SARS-CoV-2 by FDA under an Emergency Use Authorization (EUA). This EUA will remain in effect (meaning this test can be used) for the duration of the COVID-19 declaration under Section 564(b)(1) of the Act, 21 U.S.C. section 360bbb-3(b)(1), unless the authorization is terminated or revoked.     Resp Syncytial Virus by PCR NEGATIVE NEGATIVE Final    Comment: (NOTE) Fact Sheet for Patients: BloggerCourse.com  Fact Sheet for Healthcare Providers: SeriousBroker.it  This test is not yet approved or cleared by the [place] FDA and has been authorized for detection and/or diagnosis of SARS-CoV-2 by FDA under an Emergency Use Authorization (EUA). This EUA will remain in effect (meaning this test can be used) for the duration of the COVID-19 declaration under Section 564(b)(1) of the Act, 21 U.S.C. section 360bbb-3(b)(1), unless the authorization is terminated or revoked.  Performed at [facility], [address], [place], [place] [address]   Urine Culture     Status: Abnormal   Collection Time: 09/24/23  8:49 PM   Specimen: Urine, Clean Catch  Result Value Ref Range Status   Specimen Description URINE, CLEAN CATCH  Final   Special Requests   Final    NONE Performed at [facility], [address], [place], [place] [address]    Culture 20,000 COLONIES/mL ESCHERICHIA COLI (A)  Final   Report Status  09/27/2023 FINAL  Final   Organism ID, Bacteria ESCHERICHIA COLI (A)  Final      Susceptibility   Escherichia coli - MIC*    AMPICILLIN <=2 SENSITIVE Sensitive     CEFAZOLIN <=4 SENSITIVE Sensitive     CEFEPIME <=0.12 SENSITIVE Sensitive     CEFTRIAXONE <=0.25 SENSITIVE Sensitive     CIPROFLOXACIN <=0.25 SENSITIVE Sensitive     GENTAMICIN <=1 SENSITIVE Sensitive     IMIPENEM <=0.25 SENSITIVE Sensitive     NITROFURANTOIN <=16 SENSITIVE Sensitive     TRIMETH/SULFA <=20 SENSITIVE Sensitive     AMPICILLIN/SULBACTAM <=2 SENSITIVE Sensitive     PIP/TAZO <=4 SENSITIVE Sensitive ug/mL    * 20,000 COLONIES/mL ESCHERICHIA COLI  Blood Culture (routine x 2)     Status: None (Preliminary result)   Collection Time: 09/27/23  8:24 PM  Specimen: BLOOD  Result Value Ref Range Status   Specimen Description   Final    BLOOD BLOOD RIGHT HAND Performed at [facility], [address], [place], [place] [address]    Special Requests   Final    BOTTLES DRAWN AEROBIC AND ANAEROBIC Blood Culture adequate volume Performed at [facility], [address] [place], [place] [address]    Culture   Final    NO GROWTH < 12 HOURS Performed at [facility], [address], [place], [place] [address]    Report Status PENDING  Incomplete  Resp panel by RT-PCR (RSV, Flu A&B, Covid) Anterior Nasal Swab     Status: None   Collection Time: 09/27/23  9:31 PM   Specimen: Anterior Nasal Swab  Result Value Ref Range Status   SARS Coronavirus 2 by RT PCR NEGATIVE NEGATIVE Final    Comment: (NOTE) SARS-CoV-2 target nucleic acids are NOT DETECTED.  The SARS-CoV-2 RNA is generally detectable in upper respiratory specimens during the acute phase of infection. The lowest concentration of SARS-CoV-2 viral copies this assay can detect is 138 copies/mL. A negative result does not preclude SARS-Cov-2 infection and should not be used as the sole basis for treatment or other patient  management decisions. A negative result may occur with  improper specimen collection/handling, submission of specimen other than nasopharyngeal swab, presence of viral mutation(s) within the areas targeted by this assay, and inadequate number of viral copies(<138 copies/mL). A negative result must be combined with clinical observations, patient history, and epidemiological information. The expected result is Negative.  Fact Sheet for Patients:  BloggerCourse.com  Fact Sheet for Healthcare Providers:  SeriousBroker.it  This test is no t yet approved or cleared by the [place] FDA and  has been authorized for detection and/or diagnosis of SARS-CoV-2 by FDA under an Emergency Use Authorization (EUA). This EUA will remain  in effect (meaning this test can be used) for the duration of the COVID-19 declaration under Section 564(b)(1) of the Act, 21 U.S.C.section 360bbb-3(b)(1), unless the authorization is terminated  or revoked sooner.       Influenza A by PCR NEGATIVE NEGATIVE Final   Influenza B by PCR NEGATIVE NEGATIVE Final    Comment: (NOTE) The Xpert Xpress SARS-CoV-2/FLU/RSV plus assay is intended as an aid in the diagnosis of influenza from Nasopharyngeal swab specimens and should not be used as a sole basis for treatment. Nasal washings and aspirates are unacceptable for Xpert Xpress SARS-CoV-2/FLU/RSV testing.  Fact Sheet for Patients: BloggerCourse.com  Fact Sheet for Healthcare Providers: SeriousBroker.it  This test is not yet approved or cleared by the [place] FDA and has been authorized for detection and/or diagnosis of SARS-CoV-2 by FDA under an Emergency Use Authorization (EUA). This EUA will remain in effect (meaning this test can be used) for the duration of the COVID-19 declaration under Section 564(b)(1) of the Act, 21 U.S.C. section 360bbb-3(b)(1),  unless the authorization is terminated or revoked.     Resp Syncytial Virus by PCR NEGATIVE NEGATIVE Final    Comment: (NOTE) Fact Sheet for Patients: BloggerCourse.com  Fact Sheet for Healthcare Providers: SeriousBroker.it  This test is not yet approved or cleared by the [place] FDA and has been authorized for detection and/or diagnosis of SARS-CoV-2 by FDA under an Emergency Use Authorization (EUA). This EUA will remain in effect (meaning this test can be used) for the duration of the COVID-19 declaration under Section 564(b)(1) of the Act, 21  U.S.C. section 360bbb-3(b)(1), unless the authorization is terminated or revoked.  Performed at [facility], [address], [place], [place] [address]          Radiology Studies: CT HEAD WO CONTRAST ( ) Result Date: 09/28/2023 CLINICAL DATA:  Mental status change with unknown cause EXAM: CT HEAD WITHOUT CONTRAST TECHNIQUE: Contiguous axial images were obtained from the base of the skull through the vertex without intravenous contrast. RADIATION DOSE REDUCTION: This exam was performed according to the departmental dose-optimization program which includes automated exposure control, adjustment of the mA and/or kV according to patient size and/or use of iterative reconstruction technique. COMPARISON:  09/20/2023 FINDINGS: Brain: No evidence of acute infarction, hemorrhage, hydrocephalus, extra-axial collection or mass lesion/mass effect. Patchy low-density in the cerebral white matter attributed to chronic small vessel ischemia. Chronic left occipital infarct. Vascular: No hyperdense vessel or unexpected calcification. Skull: Normal. Negative for fracture or focal lesion. Sinuses/Orbits: No acute finding. IMPRESSION: No acute or interval finding. Electronically Signed   By: Tiburcio Pea M.D.   On: 09/28/2023 06:56   CT ABDOMEN PELVIS WO CONTRAST Result Date:  09/27/2023 CLINICAL DATA:  Possible bowel obstruction lethargy weakness EXAM: CT ABDOMEN AND PELVIS WITHOUT CONTRAST TECHNIQUE: Multidetector CT imaging of the abdomen and pelvis was performed following the standard protocol without IV contrast. RADIATION DOSE REDUCTION: This exam was performed according to the departmental dose-optimization program which includes automated exposure control, adjustment of the mA and/or kV according to patient size and/or use of iterative reconstruction technique. COMPARISON:  CT 09/20/2023 FINDINGS: Lower chest: Lung bases demonstrate small left-sided pleural effusion and mild pleural nodularity as seen on the prior exam. Skin thickening left breast with partially visualized mass in the lateral aspect of left breast. Small adjacent low lateral lymph nodes measuring up to 7 mm. Hepatobiliary: Numerous hypodense liver lesions corresponding to history of metastatic disease. Probable small gallstones. No biliary dilatation Pancreas: Unremarkable. No pancreatic ductal dilatation or surrounding inflammatory changes. Spleen: Normal in size without focal abnormality. Adrenals/Urinary Tract: Stable adrenal glands. No hydronephrosis. The bladder is unremarkable Stomach/Bowel: The stomach is nonenlarged. There is no dilated small bowel. Some air distension of colon but no obstruction. No acute bowel wall thickening Vascular/Lymphatic: Advanced aortic atherosclerosis. No aneurysm. Gastrohepatic lymph nodes measuring up to 11 mm. Reproductive: Hysterectomy. Lobulated soft tissue density in the left posterior pelvis suspected to represent ovary, this measures 3.7 x 2.4 cm on series 2, image 66. Other: No free air. Increased abdominopelvic ascites compared to the CT performed several days prior. Musculoskeletal: No acute or suspicious osseous abnormality. IMPRESSION: 1. No convincing evidence for bowel obstruction or bowel inflammatory process. 2. Increased abdominopelvic ascites compared to the  most recent prior 3. Small left-sided pleural effusion with pleural nodularity consistent with metastatic disease. Numerous hepatic metastatic lesions as seen on the prior exam 4. Lobulated left posterior pelvic soft tissue density possibly prominent left ovary, could be correlated with nonemergent pelvic ultrasound if desired. 5. Incompletely visualized left outer breast mass. Electronically Signed   By: Jasmine Pang M.D.   On: 09/27/2023 23:53   DG Chest Port 1 View Result Date: 09/27/2023 CLINICAL DATA:  Increased lethargy and weakness, initial encounter EXAM: PORTABLE CHEST 1 VIEW COMPARISON:  09/24/2023 FINDINGS: Cardiac shadow is stable. Loop recorder is again noted. Aortic calcifications are seen. The lungs are well aerated without focal infiltrate. Previously seen left effusion has resolved. No bony abnormality is noted. IMPRESSION: No active disease. Electronically Signed   By: Loraine Leriche  Lukens M.D.   On: 09/27/2023 21:46        Scheduled Meds:  allopurinol  100 mg Oral Daily   amLODipine  10 mg Oral Daily   carvedilol  25 mg Oral BID WC   gabapentin  100 mg Oral TID   insulin aspart  0-9 Units Subcutaneous Q4H   letrozole  2.5 mg Oral Daily   Continuous Infusions:  sodium chloride 100 mL/hr at 09/28/23 0350   ceFEPime (MAXIPIME) IV Stopped (09/28/23 0959)   metronidazole Stopped (09/28/23 1109)   [START ON 09/29/2023] vancomycin       LOS: 0 days    Time spent: 56 metastatic breast cancer minutes spent on chart review, discussion with nursing staff, consultants, updating family and interview/physical exam; more than 50% of that time was spent in counseling and/or coordination of care.    Alvira Philips [place], DO [facility] Available via Epic secure chat 7am-7pm After these hours, please refer to coverage provider listed on [email or web address] 09/28/2023, 11:22 AM

## 2023-09-28 NOTE — ED Notes (Signed)
 Daughter at bedside.

## 2023-09-29 ENCOUNTER — Inpatient Hospital Stay (HOSPITAL_COMMUNITY)

## 2023-09-29 ENCOUNTER — Encounter: Payer: Self-pay | Admitting: Hematology and Oncology

## 2023-09-29 DIAGNOSIS — C50919 Malignant neoplasm of unspecified site of unspecified female breast: Secondary | ICD-10-CM | POA: Diagnosis not present

## 2023-09-29 DIAGNOSIS — R509 Fever, unspecified: Secondary | ICD-10-CM | POA: Diagnosis not present

## 2023-09-29 LAB — COMPREHENSIVE METABOLIC PANEL
ALT: 111 U/L — ABNORMAL HIGH (ref 0–44)
AST: 302 U/L — ABNORMAL HIGH (ref 15–41)
Albumin: 2.2 g/dL — ABNORMAL LOW (ref 3.5–5.0)
Alkaline Phosphatase: 151 U/L — ABNORMAL HIGH (ref 38–126)
Anion gap: 10 (ref 5–15)
BUN: 57 mg/dL — ABNORMAL HIGH (ref 8–23)
CO2: 16 mmol/L — ABNORMAL LOW (ref 22–32)
Calcium: 8.4 mg/dL — ABNORMAL LOW (ref 8.9–10.3)
Chloride: 111 mmol/L (ref 98–111)
Creatinine, Ser: 2.35 mg/dL — ABNORMAL HIGH (ref 0.44–1.00)
GFR, Estimated: 21 mL/min — ABNORMAL LOW (ref >60)
Glucose, Bld: 157 mg/dL — ABNORMAL HIGH (ref 70–99)
Potassium: 4.8 mmol/L (ref 3.5–5.1)
Sodium: 137 mmol/L (ref 135–145)
Total Bilirubin: 1 mg/dL (ref 0.0–1.2)
Total Protein: 6 g/dL — ABNORMAL LOW (ref 6.5–8.1)

## 2023-09-29 LAB — PHOSPHORUS: Phosphorus: 3.4 mg/dL (ref 2.5–4.6)

## 2023-09-29 LAB — CBC
HCT: 23.5 % — ABNORMAL LOW (ref 36.0–46.0)
Hemoglobin: 7.6 g/dL — ABNORMAL LOW (ref 12.0–15.0)
MCH: 25.9 pg — ABNORMAL LOW (ref 26.0–34.0)
MCHC: 32.3 g/dL (ref 30.0–36.0)
MCV: 80.2 fL (ref 80.0–100.0)
Platelets: 278 10*3/uL (ref 150–400)
RBC: 2.93 MIL/uL — ABNORMAL LOW (ref 3.87–5.11)
RDW: 14.6 % (ref 11.5–15.5)
WBC: 13.4 10*3/uL — ABNORMAL HIGH (ref 4.0–10.5)
nRBC: 0.7 % — ABNORMAL HIGH (ref 0.0–0.2)

## 2023-09-29 LAB — URINE CULTURE: Culture: NO GROWTH

## 2023-09-29 LAB — GLUCOSE, CAPILLARY
Glucose-Capillary: 146 mg/dL — ABNORMAL HIGH (ref 70–99)
Glucose-Capillary: 147 mg/dL — ABNORMAL HIGH (ref 70–99)
Glucose-Capillary: 158 mg/dL — ABNORMAL HIGH (ref 70–99)
Glucose-Capillary: 159 mg/dL — ABNORMAL HIGH (ref 70–99)
Glucose-Capillary: 168 mg/dL — ABNORMAL HIGH (ref 70–99)
Glucose-Capillary: 171 mg/dL — ABNORMAL HIGH (ref 70–99)

## 2023-09-29 LAB — C DIFFICILE QUICK SCREEN W PCR REFLEX
C Diff antigen: POSITIVE — AB
C Diff toxin: NEGATIVE

## 2023-09-29 LAB — MAGNESIUM: Magnesium: 2.3 mg/dL (ref 1.7–2.4)

## 2023-09-29 MED ORDER — VANCOMYCIN HCL 125 MG PO CAPS
125.0000 mg | ORAL_CAPSULE | Freq: Four times a day (QID) | ORAL | Status: DC
Start: 2023-09-29 — End: 2023-10-06
  Administered 2023-09-29 – 2023-10-06 (×28): 125 mg via ORAL
  Filled 2023-09-29 (×30): qty 1

## 2023-09-29 MED ORDER — IBUPROFEN 200 MG PO TABS
400.0000 mg | ORAL_TABLET | Freq: Four times a day (QID) | ORAL | Status: DC | PRN
  Administered 2023-09-29: 400 mg via ORAL
  Filled 2023-09-29: qty 2

## 2023-09-29 MED ORDER — SODIUM CHLORIDE 0.9 % IV SOLN: INTRAVENOUS | Status: AC

## 2023-09-29 NOTE — Evaluation (Signed)
 Occupational Therapy Evaluation Patient Details Name: Heather Rogers MRN: [id number] DOB: 07/14/1946 Today's Date: 09/29/2023   History of Present Illness   Patient is a 78 year old female who presented on 2/28 with increased lethargy and weakness. Patient was admitted with sepsis, acute metabolic encephalopathy, AKI. PMH: breast cancer with mets to liver, none and lung,     Clinical Impressions Patient is a 78 year old female who was admitted for above. Patient was living at home with grandson prior level with independence in ADLs. Currently, patient is max A for ADLs at bed level with increased work of breath with minimal tasks. O2 saturation maintained in 96% or higher on RA during session. Patient encouraged to cough and use yonker for getting sputum out of mouth. Patient;s daughter was present in room encouraging patient during session. Patient was noted to have decreased functional activity tolernace, decreased ROM, decreased BUE strength, decreased endurance, decreased sitting balance, decreased standing balanced, decreased safety awareness, and decreased knowledge of AE/AD impacting participation in ADLs. Patient will benefit from continued inpatient follow up therapy, <3 hours/day.      If plan is discharge home, recommend the following:   Two people to help with walking and/or transfers;A lot of help with bathing/dressing/bathroom;Assistance with cooking/housework;Direct supervision/assist for medications management;Assist for transportation;Help with stairs or ramp for entrance;Direct supervision/assist for financial management     Functional Status Assessment   Patient has had a recent decline in their functional status and demonstrates the ability to make significant improvements in function in a reasonable and predictable amount of time.     Equipment Recommendations   None recommended by OT      Precautions/Restrictions   Precautions Precautions:  Fall Precaution/Restrictions Comments: monitor vitals Restrictions Weight Bearing Restrictions Per Provider Order: No     Mobility Bed Mobility Overal bed mobility: Needs Assistance Bed Mobility: Supine to Sit, Sit to Supine     Supine to sit: Max assist Sit to supine: Max assist   General bed mobility comments: with increased time and cues for sequencing            Balance Overall balance assessment: Needs assistance Sitting-balance support: Bilateral upper extremity supported, Feet unsupported Sitting balance-Leahy Scale: Poor             ADL either performed or assessed with clinical judgement   ADL Overall ADL's : Needs assistance/impaired   Eating/Feeding Details (indicate cue type and reason): patient declined to eat while therapist was in room requesting to rest. Grooming: Sitting;Maximal assistance   Upper Body Bathing: Sitting;Maximal assistance   Lower Body Bathing: Bed level;Total assistance   Upper Body Dressing : Sitting;Maximal assistance   Lower Body Dressing: Bed level;Total assistance     Toilet Transfer Details (indicate cue type and reason): unable to attempt standing with patient needing increased A sitting EOB and noted to have icnreased work of breathing sitting EOB with physical A. patient O2 saturation WFL. Toileting- Clothing Manipulation and Hygiene: Bed level;Total assistance Toileting - Clothing Manipulation Details (indicate cue type and reason): noted to have some loose stool with sitting EOB and cleaned up 1x then reporting feeling needing to void again. NT in room with patient placed on bed pan at the end of session.             Vision Baseline Vision/History: 1 Wears glasses              Pertinent Vitals/Pain Pain Assessment Pain Assessment: No/denies pain  Extremity/Trunk Assessment Upper Extremity Assessment Upper Extremity Assessment: Overall WFL for tasks assessed   Lower Extremity Assessment Lower  Extremity Assessment: Defer to PT evaluation   Cervical / Trunk Assessment Cervical / Trunk Assessment: Kyphotic      Cognition Arousal: Lethargic Behavior During Therapy: Flat affect Cognition: Difficult to assess             OT - Cognition Comments: patient would have long pauses with answering questions with patient forgetting question at times. daughter reported this was not patients baseline.                 Following commands: Impaired Following commands impaired: Follows one step commands with increased time                Home Living Family/patient expects to be discharged to:: Private residence Living Arrangements: Other relatives (grandson) Available Help at Discharge: Family;Available PRN/intermittently Type of Home: House Home Access: Stairs to enter     Home Layout: One level     Bathroom Shower/Tub: Chief Strategy Officer: Standard Bathroom Accessibility: Yes   Home Equipment: BSC/3in1          Prior Functioning/Environment Prior Level of Function : Independent/Modified Independent                    OT Problem List: Impaired balance (sitting and/or standing);Decreased knowledge of precautions;Decreased range of motion;Decreased knowledge of use of DME or AE;Decreased activity tolerance;Cardiopulmonary status limiting activity;Decreased safety awareness;Decreased cognition   OT Treatment/Interventions: Self-care/ADL training;DME and/or AE instruction;Therapeutic activities;Balance training;Therapeutic exercise;Energy conservation;Patient/family education      OT Goals(Current goals can be found in the care plan section)   Acute Rehab OT Goals Patient Stated Goal: to go home OT Goal Formulation: With patient/family Time For Goal Achievement: 10/13/23 Potential to Achieve Goals: Fair   OT Frequency:  Min 1X/week       AM-PAC OT "6 Clicks" Daily Activity     Outcome Measure Help from another person eating  meals?: A Lot Help from another person taking care of personal grooming?: A Lot Help from another person toileting, which includes using toliet, bedpan, or urinal?: Total Help from another person bathing (including washing, rinsing, drying)?: Total Help from another person to put on and taking off regular upper body clothing?: A Lot Help from another person to put on and taking off regular lower body clothing?: A Lot 6 Click Score: 10   End of Session Nurse Communication: Mobility status  Activity Tolerance: Patient limited by fatigue Patient left: in bed;with call bell/phone within reach;with bed alarm set;with family/visitor present;Other (comment) (NT in room)  OT Visit Diagnosis: Unsteadiness on feet (R26.81);Other abnormalities of gait and mobility (R26.89)                Time: 0454-0981 OT Time Calculation (min): 30 min Charges:  OT General Charges $OT Visit: 1 Visit OT Evaluation $OT Eval Moderate Complexity: 1 Mod OT Treatments $Self Care/Home Management : 8-22 mins  Rosalio Loud, MS Acute Rehabilitation Department Office# [phone]   Selinda Flavin 09/29/2023, 2:52 PM

## 2023-09-29 NOTE — Progress Notes (Addendum)
 PROGRESS NOTE    TANESSA TIDD  ZOX:[id number] DOB: [birth year] DOA: 09/27/2023 PCP: [name], MD    Brief Narrative:   Heather Rogers is a 78 y.o. female with past medical history significant for breast cancer with metastasis to liver, lung, bone with concern for thyroid involvement with nodule, DM2, HTN, gout, history of CVA, anemia of chronic kidney disease, HLD who presented to [facility] ED on 09/27/2023 from home via EMS with reports of increased weakness, lethargy.  Was recently prescribed antibiotics for UTI.  Patient also endorses loose stools but denies abdominal pain.  Stools reported dark but has been taking an iron supplement at home.  No recent sick contacts, no headache, no neck pain/stiffness, no skin lesions/rash.  Patient does not have a Port-A-Cath but does have an implanted loop recorder that is currently not operable.  In the ED, temperature 103.2 F, HR 82, RR 24, BP 134/85, SpO2 96% on room air.  WBC 11.7, hemoglobin 7.6, platelet count 290.  Sodium 132, potassium 4.6, chloride 103, CO2 18, glucose 387, BUN 54, creatinine 2.63, AST 235, ALT 96, total bilirubin 0.8.  Alkaline phosphatase 149.  Lactic acid 1.7.  INR 1.3.  COVID/influenza/RSV PCR negative.  Urinalysis unrevealing.  CT head without contrast with no acute finding.  CT abdomen/pelvis without contrast with no evidence of bowel obstruction or bowel inflammatory process, increased abdominal pelvic ascites, paired to recent study, small left pleural effusion with pleural nodularity consistent with metastatic disease, numerous hepatic metastatic lesions as seen on prior exam, lobulated left posterior pelvic soft tissue density probably prominent left ovary, noted left outer breast mass. Patient was given Tylenol, vancomycin, ceftriaxone, metronidazole, and 2.25 L LR boluses. TRH called to admit.   Assessment & Plan:   SIRS Concern for C. difficile colitis Met SIRS criteria at the time of  presentation with fever and slight tachypnea.  Recently diagnosed with UTI and started on outpatient antibiotics with cefadroxil.  WBC count borderline elevated.  No tachycardia, hypotension, or lactic acidosis to suggest sepsis.  No infectious source identified on imaging of chest and abdomen/pelvis.  Has ascites but no abdominal pain or tenderness to suggest SBP.  UA not suggestive of infection.  COVID/influenza/RSV PCR negative.  Chest x-ray with no acute cardiopulmonary disease process.  No other obvious infectious source at this time.  -- C. Difficile screen: + antigen, negative toxin; awaiting PCR -- GI PCR panel: Pending -- Blood cultures x 2: no growth <24h -- MRSA PCR negative -- Discontinue cefepime/vancomycin/metronidazole -- Start oral vancomycin 125 mg p.o. 4 times daily x 10 days -- CBC daily; monitor fever curve  Acute metabolic encephalopathy Patient presenting with confusion; but able to follow commands and answer questions appropriately.  No nuchal rigidity suggest meningitis.  No focal neurological deficit on physical exam.  CT head without contrast with no acute findings.  Ammonia level 36. -- SLP for swallow evaluation -- Aspiration/fall precautions -- Continue supportive care  AKI on CKD stage IIIb Likely prerenal from dehydration. Creatinine currently 2.6 and baseline 1.4 - 1.7.  CT without evidence of obstructive uropathy.   -- Cr 2.63>2.49>2.35 -- Avoid nephrotoxins, renal dose all medications -- continue IVF w/ NS at 75 mL/h -- BMP daily  Elevated LFTs AST 235, ALT 96, alk phos 149, T. bili 0.8 on admission.  Likely related to hepatic metastases as seen on CT.  No history of alcohol abuse.  Acute hepatitis panel negative. -- AST 235>213>302 -- ALT 96>87>111 -- Tbili  0.8>1.0>1.0 -- CMP daily  Hyponatremia: Resolved Sodium 132 on admission.  Etiology likely secondary to hypovolemic hyponatremia in the setting of poor oral intake in days preceding  hospitalization. -- Na 161>096>045 -- NS at 75 mL/h  Ascites CT showing increased abdominopelvic ascites.  Likely malignant ascites given known metastatic breast cancer with hepatic mets. ?SBP given fever but no abdominal pain or tenderness.  Already on broad-spectrum antibiotics.   -- US paracentesis ordered    Generalized weakness -- Pending PT/OT evaluation -- Fall precautions   Abnormal imaging finding CT abdomen pelvis showing lobulated left posterior pelvic soft tissue density suspicious for possible prominent left ovary.  -- Outpatient nonemergent pelvic ultrasound for further evaluation.  Breast cancer with metastasis to liver, bone, lung Follows with medical oncology, Dr. Pamelia Hoit.  Last seen in office 06/13/2023.  Outpatient scans showing progression of metastasis.  Has been on treatment with [name] with letrozole over the past year.   Type 2 diabetes mellitus, with hyperglycemia Hemoglobin A1c 7.4.  On metformin 5 mg p.o. daily at baseline. -- Hold oral hypoglycemics while inpatient -- Sensitive SSI for coverage -- CBGs q4h while NPO  Essential hypertension -- Amlodipine 10 mg p.o. daily -- Carvedilol 25 mg p.o. twice daily  Hyperlipidemia -- Hold home statin secondary to elevated LFTs  Anemia of chronic medical/renal disease Hemoglobin currently 7.6 and baseline and is in the 9-10 range.  FOBT negative.  Anemia panel with iron 28, TIBC 113, ferritin greater than 7500, folate 26.6, vitamin B12 3730.   -- Transfuse for hemoglobin less than 7.0 t  History of CVA -- Hold aspirin in the setting of anemia  Gout -- Continue allopurinol 100 mg POP daily  Chronic pain/neuropathy -- Gabapentin 100 mg PO TID   DVT prophylaxis: SCDs Start: 09/28/23 0307    Code Status: Do not attempt resuscitation (DNR) PRE-ARREST INTERVENTIONS DESIRED Family Communication: Updated daughter present at bedside  Disposition Plan:  Level of care: Progressive Status is:  Inpatient Remains inpatient appropriate because: IV antibiotics, pending paracentesis    Consultants:  None  Procedures:  Ultrasound paracentesis: Pending  Antimicrobials:  Vancomycin 2/28>> Cefepime 2/28>> Metronidazole 2/28>> Ceftriaxone 2/28 - 2/28   Subjective: Patient seen examined bedside, lying in bed.  Mental status improved.  Discontinued vancomycin given negative MRSA PCR.  Continues with diarrhea, C. difficile panel negative yesterday.  Still await GI PCR panel to return.  Fever curve improving.  Continues with generalized weakness/fatigue, otherwise no other specific complaints, concerns or questions this morning.  Denies headache, no chest pain, no palpitations, no abdominal pain, no chills/night sweats, no nausea/vomiting.  No acute events overnight per nursing staff.  Objective: Vitals:   09/28/23 1947 09/29/23 0410 09/29/23 0554 09/29/23 0901  BP: (!) 122/58 (!) 156/69 137/62 (!) 133/59  Pulse: 84 94 92 91  Resp: (!) 22 18 17  (!) 30  Temp: 98.8 F (37.1 C) (!) 102.7 F (39.3 C) (!) 100.6 F (38.1 C) 98.9 F (37.2 C)  TempSrc: Axillary Axillary Axillary Oral  SpO2: 100% 94% 100% 98%  Weight:      Height:        Intake/Output Summary (Last 24 hours) at 09/29/2023 1101 Last data filed at 09/29/2023 0600 Gross per 24 hour  Intake 2094.72 ml  Output 251 ml  Net 1843.72 ml   Filed Weights   09/27/23 2015  Weight: 71.2 kg    Examination:  Physical Exam: GEN: NAD, alert and oriented x 3, chronically ill appearance, appears older than stated  age HEENT: NCAT, PERRL, EOMI, sclera clear, MMM PULM: CTAB w/o wheezes/crackles, normal respiratory effort, on room air CV: RRR w/o M/G/R GI: abd soft, NTND, NABS, no R/G/M MSK: no peripheral edema, moves all extremities independently NEURO: No focal neurological deficits PSYCH: normal mood/affect Integumentary: No concerning rashes/lesions/wounds noted on exposed skin surfaces.    Data Reviewed: I have  personally reviewed following labs and imaging studies  CBC: Recent Labs  Lab 09/24/23 1031 09/27/23 2023 09/28/23 0344 09/29/23 0516  WBC 8.6 11.7* 10.5 13.4*  NEUTROABS  --  9.0*  --   --   HGB 8.7* 7.6* 7.1* 7.6*  HCT 26.9* 22.6* 21.4* 23.5*  MCV 81.5 78.2* 78.7* 80.2  PLT 258 290 266 278   Basic Metabolic Panel: Recent Labs  Lab 09/24/23 1031 09/27/23 2023 09/28/23 0344 09/29/23 0516  NA 135 132* 133* 137  K 4.8 4.6 4.4 4.8  CL 102 103 104 111  CO2 20* 18* 21* 16*  GLUCOSE 208* 387* 314* 157*  BUN 35* 54* 50* 57*  CREATININE 1.90* 2.63* 2.49* 2.35*  CALCIUM 9.8 8.8* 8.5* 8.4*  MG  --   --   --  2.3  PHOS  --   --   --  3.4   GFR: Estimated Creatinine Clearance: 18.1 mL/min (A) (by C-G formula based on SCr of 2.35 mg/dL (H)). Liver Function Tests: Recent Labs  Lab 09/27/23 2023 09/28/23 0344 09/29/23 0516  AST 235* 213* 302*  ALT 96* 87* 111*  ALKPHOS 149* 139* 151*  BILITOT 0.8 1.0 1.0  PROT 6.7 6.0* 6.0*  ALBUMIN 2.6* 2.2* 2.2*   No results for input(s): "LIPASE", "AMYLASE" in the last 168 hours. Recent Labs  Lab 09/28/23 1226  AMMONIA 36*   Coagulation Profile: Recent Labs  Lab 09/27/23 2023  INR 1.3*   Cardiac Enzymes: No results for input(s): "CKTOTAL", "CKMB", "CKMBINDEX", "TROPONINI" in the last 168 hours. BNP (last 3 results) No results for input(s): "PROBNP" in the last 8760 hours. HbA1C: Recent Labs    09/28/23 0344  HGBA1C 7.4*   CBG: Recent Labs  Lab 09/28/23 1710 09/28/23 1945 09/28/23 2348 09/29/23 0409 09/29/23 0804  GLUCAP 170* 169* 160* 158* 147*   Lipid Profile: No results for input(s): "CHOL", "HDL", "LDLCALC", "TRIG", "CHOLHDL", "LDLDIRECT" in the last 72 hours. Thyroid Function Tests: No results for input(s): "TSH", "T4TOTAL", "FREET4", "T3FREE", "THYROIDAB" in the last 72 hours. Anemia Panel: Recent Labs    09/27/23 2239  VITAMINB12 3,730*  FOLATE 26.2  FERRITIN >7,500*  TIBC 113*  IRON 28   RETICCTPCT 0.6   Sepsis Labs: Recent Labs  Lab 09/27/23 2037  LATICACIDVEN 1.7    Recent Results (from the past 240 hours)  Resp panel by RT-PCR (RSV, Flu A&B, Covid) Anterior Nasal Swab     Status: None   Collection Time: 09/20/23  1:55 PM   Specimen: Anterior Nasal Swab  Result Value Ref Range Status   SARS Coronavirus 2 by RT PCR NEGATIVE NEGATIVE Final    Comment: (NOTE) SARS-CoV-2 target nucleic acids are NOT DETECTED.  The SARS-CoV-2 RNA is generally detectable in upper respiratory specimens during the acute phase of infection. The lowest concentration of SARS-CoV-2 viral copies this assay can detect is 138 copies/mL. A negative result does not preclude SARS-Cov-2 infection and should not be used as the sole basis for treatment or other patient management decisions. A negative result may occur with  improper specimen collection/handling, submission of specimen other than nasopharyngeal swab, presence of viral  mutation(s) within the areas targeted by this assay, and inadequate number of viral copies(<138 copies/mL). A negative result must be combined with clinical observations, patient history, and epidemiological information. The expected result is Negative.  Fact Sheet for Patients:  BloggerCourse.com  Fact Sheet for Healthcare Providers:  SeriousBroker.it  This test is no t yet approved or cleared by the [place] FDA and  has been authorized for detection and/or diagnosis of SARS-CoV-2 by FDA under an Emergency Use Authorization (EUA). This EUA will remain  in effect (meaning this test can be used) for the duration of the COVID-19 declaration under Section 564(b)(1) of the Act, 21 U.S.C.section 360bbb-3(b)(1), unless the authorization is terminated  or revoked sooner.       Influenza A by PCR NEGATIVE NEGATIVE Final   Influenza B by PCR NEGATIVE NEGATIVE Final    Comment: (NOTE) The Xpert Xpress  SARS-CoV-2/FLU/RSV plus assay is intended as an aid in the diagnosis of influenza from Nasopharyngeal swab specimens and should not be used as a sole basis for treatment. Nasal washings and aspirates are unacceptable for Xpert Xpress SARS-CoV-2/FLU/RSV testing.  Fact Sheet for Patients: BloggerCourse.com  Fact Sheet for Healthcare Providers: SeriousBroker.it  This test is not yet approved or cleared by the [place] FDA and has been authorized for detection and/or diagnosis of SARS-CoV-2 by FDA under an Emergency Use Authorization (EUA). This EUA will remain in effect (meaning this test can be used) for the duration of the COVID-19 declaration under Section 564(b)(1) of the Act, 21 U.S.C. section 360bbb-3(b)(1), unless the authorization is terminated or revoked.     Resp Syncytial Virus by PCR NEGATIVE NEGATIVE Final    Comment: (NOTE) Fact Sheet for Patients: BloggerCourse.com  Fact Sheet for Healthcare Providers: SeriousBroker.it  This test is not yet approved or cleared by the [place] FDA and has been authorized for detection and/or diagnosis of SARS-CoV-2 by FDA under an Emergency Use Authorization (EUA). This EUA will remain in effect (meaning this test can be used) for the duration of the COVID-19 declaration under Section 564(b)(1) of the Act, 21 U.S.C. section 360bbb-3(b)(1), unless the authorization is terminated or revoked.  Performed at [facility], [address], [place], [place] [address]   Urine Culture     Status: Abnormal   Collection Time: 09/24/23  8:49 PM   Specimen: Urine, Clean Catch  Result Value Ref Range Status   Specimen Description URINE, CLEAN CATCH  Final   Special Requests   Final    NONE Performed at [facility], [address] [place], [place] [address]    Culture 20,000 COLONIES/mL ESCHERICHIA COLI (A)   Final   Report Status 09/27/2023 FINAL  Final   Organism ID, Bacteria ESCHERICHIA COLI (A)  Final      Susceptibility   Escherichia coli - MIC*    AMPICILLIN <=2 SENSITIVE Sensitive     CEFAZOLIN <=4 SENSITIVE Sensitive     CEFEPIME <=0.12 SENSITIVE Sensitive     CEFTRIAXONE <=0.25 SENSITIVE Sensitive     CIPROFLOXACIN <=0.25 SENSITIVE Sensitive     GENTAMICIN <=1 SENSITIVE Sensitive     IMIPENEM <=0.25 SENSITIVE Sensitive     NITROFURANTOIN <=16 SENSITIVE Sensitive     TRIMETH/SULFA <=20 SENSITIVE Sensitive     AMPICILLIN/SULBACTAM <=2 SENSITIVE Sensitive     PIP/TAZO <=4 SENSITIVE Sensitive ug/mL    * 20,000 COLONIES/mL ESCHERICHIA COLI  Blood Culture (routine x 2)     Status: None (Preliminary result)  Collection Time: 09/27/23  8:24 PM   Specimen: BLOOD  Result Value Ref Range Status   Specimen Description   Final    BLOOD BLOOD RIGHT HAND Performed at [facility], [address], [place], [place] [address]    Special Requests   Final    BOTTLES DRAWN AEROBIC AND ANAEROBIC Blood Culture adequate volume Performed at [facility], [address] [place], [place] [address]    Culture   Final    NO GROWTH 2 DAYS Performed at [facility], [address], [place], [place] [address]    Report Status PENDING  Incomplete  Resp panel by RT-PCR (RSV, Flu A&B, Covid) Anterior Nasal Swab     Status: None   Collection Time: 09/27/23  9:31 PM   Specimen: Anterior Nasal Swab  Result Value Ref Range Status   SARS Coronavirus 2 by RT PCR NEGATIVE NEGATIVE Final    Comment: (NOTE) SARS-CoV-2 target nucleic acids are NOT DETECTED.  The SARS-CoV-2 RNA is generally detectable in upper respiratory specimens during the acute phase of infection. The lowest concentration of SARS-CoV-2 viral copies this assay can detect is 138 copies/mL. A negative result does not preclude SARS-Cov-2 infection and should not be used as the sole basis for  treatment or other patient management decisions. A negative result may occur with  improper specimen collection/handling, submission of specimen other than nasopharyngeal swab, presence of viral mutation(s) within the areas targeted by this assay, and inadequate number of viral copies(<138 copies/mL). A negative result must be combined with clinical observations, patient history, and epidemiological information. The expected result is Negative.  Fact Sheet for Patients:  BloggerCourse.com  Fact Sheet for Healthcare Providers:  SeriousBroker.it  This test is no t yet approved or cleared by the [place] FDA and  has been authorized for detection and/or diagnosis of SARS-CoV-2 by FDA under an Emergency Use Authorization (EUA). This EUA will remain  in effect (meaning this test can be used) for the duration of the COVID-19 declaration under Section 564(b)(1) of the Act, 21 U.S.C.section 360bbb-3(b)(1), unless the authorization is terminated  or revoked sooner.       Influenza A by PCR NEGATIVE NEGATIVE Final   Influenza B by PCR NEGATIVE NEGATIVE Final    Comment: (NOTE) The Xpert Xpress SARS-CoV-2/FLU/RSV plus assay is intended as an aid in the diagnosis of influenza from Nasopharyngeal swab specimens and should not be used as a sole basis for treatment. Nasal washings and aspirates are unacceptable for Xpert Xpress SARS-CoV-2/FLU/RSV testing.  Fact Sheet for Patients: BloggerCourse.com  Fact Sheet for Healthcare Providers: SeriousBroker.it  This test is not yet approved or cleared by the [place] FDA and has been authorized for detection and/or diagnosis of SARS-CoV-2 by FDA under an Emergency Use Authorization (EUA). This EUA will remain in effect (meaning this test can be used) for the duration of the COVID-19 declaration under Section 564(b)(1) of the Act, 21  U.S.C. section 360bbb-3(b)(1), unless the authorization is terminated or revoked.     Resp Syncytial Virus by PCR NEGATIVE NEGATIVE Final    Comment: (NOTE) Fact Sheet for Patients: BloggerCourse.com  Fact Sheet for Healthcare Providers: SeriousBroker.it  This test is not yet approved or cleared by the [place] FDA and has been authorized for detection and/or diagnosis of SARS-CoV-2 by FDA under an Emergency Use Authorization (EUA). This EUA will remain in effect (meaning this test can be used) for the duration of the COVID-19 declaration  under Section 564(b)(1) of the Act, 21 U.S.C. section 360bbb-3(b)(1), unless the authorization is terminated or revoked.  Performed at [facility], [address], [place], [place] [address]   Blood Culture (routine x 2)     Status: None (Preliminary result)   Collection Time: 09/28/23  2:09 PM   Specimen: BLOOD LEFT ARM  Result Value Ref Range Status   Specimen Description   Final    BLOOD LEFT ARM Performed at [facility], [address] [place], [place] [address]    Special Requests   Final    BOTTLES DRAWN AEROBIC AND ANAEROBIC Blood Culture results may not be optimal due to an inadequate volume of blood received in culture bottles Performed at [facility], [address] [place], [place] [address]    Culture   Final    NO GROWTH < 24 HOURS Performed at [facility], [address] [place], [place] [address]    Report Status PENDING  Incomplete  MRSA Next Gen by PCR, Nasal     Status: None   Collection Time: 09/28/23  3:00 PM   Specimen: Nasal Mucosa; Nasal Swab  Result Value Ref Range Status   MRSA by PCR Next Gen NOT DETECTED NOT DETECTED Final    Comment: (NOTE) The GeneXpert MRSA Assay (FDA approved for NASAL specimens only), is one component of a comprehensive MRSA colonization surveillance program. It is not  intended to diagnose MRSA infection nor to guide or monitor treatment for MRSA infections. Test performance is not FDA approved in patients less than 61 years old. Performed at [facility], [address] [place], [place] [address]          Radiology Studies: [place] ASCITES (ABDOMEN LIMITED) Result Date: 09/28/2023 CLINICAL DATA:  Patient with history of metastatic breast cancer, including hepatic lesions. Weakness, abdominal discomfort. Ascites seen on recent CT scan. Request for possible paracentesis. EXAM: LIMITED ABDOMEN ULTRASOUND FOR ASCITES TECHNIQUE: Limited ultrasound survey for ascites was performed in all four abdominal quadrants. COMPARISON:  CT abdomen and pelvis 09/27/2023 FINDINGS: Limited ultrasound of four-quadrant finds small volume of perihepatic and low pelvic ascites. This is insufficient for safe paracentesis. Procedure not performed. IMPRESSION: Small volume perihepatic and pelvic ascites. Procedure performed by Brayton El PA-C and supervised by Dr. Marliss Coots Electronically Signed   By: Marliss Coots M.D.   On: 09/28/2023 16:38   CT HEAD WO CONTRAST ( ) Result Date: 09/28/2023 CLINICAL DATA:  Mental status change with unknown cause EXAM: CT HEAD WITHOUT CONTRAST TECHNIQUE: Contiguous axial images were obtained from the base of the skull through the vertex without intravenous contrast. RADIATION DOSE REDUCTION: This exam was performed according to the departmental dose-optimization program which includes automated exposure control, adjustment of the mA and/or kV according to patient size and/or use of iterative reconstruction technique. COMPARISON:  09/20/2023 FINDINGS: Brain: No evidence of acute infarction, hemorrhage, hydrocephalus, extra-axial collection or mass lesion/mass effect. Patchy low-density in the cerebral white matter attributed to chronic small vessel ischemia. Chronic left occipital infarct. Vascular: No hyperdense vessel or unexpected  calcification. Skull: Normal. Negative for fracture or focal lesion. Sinuses/Orbits: No acute finding. IMPRESSION: No acute or interval finding. Electronically Signed   By: Tiburcio Pea M.D.   On: 09/28/2023 06:56   CT ABDOMEN PELVIS WO CONTRAST Result Date: 09/27/2023 CLINICAL DATA:  Possible bowel obstruction lethargy weakness EXAM: CT ABDOMEN AND PELVIS WITHOUT CONTRAST TECHNIQUE: Multidetector CT imaging of the abdomen and  pelvis was performed following the standard protocol without IV contrast. RADIATION DOSE REDUCTION: This exam was performed according to the departmental dose-optimization program which includes automated exposure control, adjustment of the mA and/or kV according to patient size and/or use of iterative reconstruction technique. COMPARISON:  CT 09/20/2023 FINDINGS: Lower chest: Lung bases demonstrate small left-sided pleural effusion and mild pleural nodularity as seen on the prior exam. Skin thickening left breast with partially visualized mass in the lateral aspect of left breast. Small adjacent low lateral lymph nodes measuring up to 7 mm. Hepatobiliary: Numerous hypodense liver lesions corresponding to history of metastatic disease. Probable small gallstones. No biliary dilatation Pancreas: Unremarkable. No pancreatic ductal dilatation or surrounding inflammatory changes. Spleen: Normal in size without focal abnormality. Adrenals/Urinary Tract: Stable adrenal glands. No hydronephrosis. The bladder is unremarkable Stomach/Bowel: The stomach is nonenlarged. There is no dilated small bowel. Some air distension of colon but no obstruction. No acute bowel wall thickening Vascular/Lymphatic: Advanced aortic atherosclerosis. No aneurysm. Gastrohepatic lymph nodes measuring up to 11 mm. Reproductive: Hysterectomy. Lobulated soft tissue density in the left posterior pelvis suspected to represent ovary, this measures 3.7 x 2.4 cm on series 2, image 66. Other: No free air. Increased  abdominopelvic ascites compared to the CT performed several days prior. Musculoskeletal: No acute or suspicious osseous abnormality. IMPRESSION: 1. No convincing evidence for bowel obstruction or bowel inflammatory process. 2. Increased abdominopelvic ascites compared to the most recent prior 3. Small left-sided pleural effusion with pleural nodularity consistent with metastatic disease. Numerous hepatic metastatic lesions as seen on the prior exam 4. Lobulated left posterior pelvic soft tissue density possibly prominent left ovary, could be correlated with nonemergent pelvic ultrasound if desired. 5. Incompletely visualized left outer breast mass. Electronically Signed   By: Jasmine Pang M.D.   On: 09/27/2023 23:53   DG Chest Port 1 View Result Date: 09/27/2023 CLINICAL DATA:  Increased lethargy and weakness, initial encounter EXAM: PORTABLE CHEST 1 VIEW COMPARISON:  09/24/2023 FINDINGS: Cardiac shadow is stable. Loop recorder is again noted. Aortic calcifications are seen. The lungs are well aerated without focal infiltrate. Previously seen left effusion has resolved. No bony abnormality is noted. IMPRESSION: No active disease. Electronically Signed   By: Alcide Clever M.D.   On: 09/27/2023 21:46        Scheduled Meds:  allopurinol  100 mg Oral Daily   amLODipine  10 mg Oral Daily   carvedilol  25 mg Oral BID WC   gabapentin  100 mg Oral TID   insulin aspart  0-9 Units Subcutaneous Q4H   letrozole  2.5 mg Oral Daily   Continuous Infusions:  sodium chloride     ceFEPime (MAXIPIME) IV 2 g (09/29/23 1020)   metronidazole 500 mg (09/28/23 2201)     LOS: 1 day    Time spent: 56 metastatic breast cancer minutes spent on chart review, discussion with nursing staff, consultants, updating family and interview/physical exam; more than 50% of that time was spent in counseling and/or coordination of care.    Alvira Philips [place], DO [facility] Available via Epic secure chat 7am-7pm After  these hours, please refer to coverage provider listed on [email or web address] 09/29/2023, 11:01 AM

## 2023-09-29 NOTE — Progress Notes (Signed)
 Chaplain responded to [facility] consult for assistance with AD. Pt was not alert at the time of this visit, pt's daughter who requested paperwork was present. Chaplain provided AD paperwork and education to dtr, dtr is aware of how to contact chaplain to complete paperwork after reviewing with pt. Chaplain [place], [place]   09/29/23 1500  Spiritual Encounters  Type of Visit Initial  Care provided to: Family  Referral source Nurse (RN/NT/LPN)  Reason for visit Advance directives  Spiritual Framework  Presenting Themes Goals in life/care  Spiritual Care Plan  Spiritual Care Issues Still Outstanding Referring to oncoming chaplain for further support

## 2023-09-29 NOTE — Progress Notes (Signed)
   09/29/23 1433  Assess: MEWS Score  Temp (!) 101.9 F (38.8 C)  BP (!) 144/64  MAP (mmHg) 86  Pulse Rate 96  Resp (!) 36  SpO2 96 %  O2 Device Room Air  Assess: MEWS Score  MEWS Temp 2  MEWS Systolic 0  MEWS Pulse 0  MEWS RR 3  MEWS LOC 0  MEWS Score 5  MEWS Score Color Red  Assess: if the MEWS score is Yellow or Red  Were vital signs accurate and taken at a resting state? Yes  Does the patient meet 2 or more of the SIRS criteria? Yes  Does the patient have a confirmed or suspected source of infection? No  MEWS guidelines implemented  Yes, red  Treat  MEWS Interventions Considered administering scheduled or prn medications/treatments as ordered  Take Vital Signs  Increase Vital Sign Frequency  Red: Q1hr x2, continue Q4hrs until patient remains green for 12hrs  Escalate  MEWS: Escalate Red: Discuss with charge nurse and notify provider. Consider notifying RRT. If remains red for 2 hours consider need for higher level of care  Notify: Charge Nurse/RN  Name of Charge Nurse/RN Child psychotherapist, RN  Provider Notification  Provider Name/Title [place] DO  Date Provider Notified 09/29/23  Time Provider Notified 1457  Method of Notification Page (secure chat)  Notification Reason Other (Comment) (Red MEWS)  Provider response See new orders  Date of Provider Response 09/29/23  Time of Provider Response 1505  Notify: Rapid Response  Name of Rapid Response RN Notified  (CN to notify RR to keep patient on radar)  Date Rapid Response Notified 09/29/23  Time Rapid Response Notified 1500  Assess: SIRS CRITERIA  SIRS Temperature  1  SIRS Respirations  1  SIRS Pulse 1  SIRS WBC 0  SIRS Score Sum  3   [place] DO and CN made aware. Ice packs applied and PRN tylenol given to patient. Patient alert and stable at this time. See new orders.

## 2023-09-29 NOTE — Evaluation (Signed)
 SLP Cancellation Note  Patient Details Name: JMYA ULIANO MRN: [id number] DOB: 1946-02-25   Cancelled treatment:       Reason Eval/Treat Not Completed: Other (comment) (pt going for chest CT, will continue efforts)   Chales Abrahams 09/29/2023, 4:29 PM   Rolena Infante, MS [facility] SLP Acute Rehab Services Office [phone]

## 2023-09-29 NOTE — Plan of Care (Signed)
  Problem: Skin Integrity: Goal: Risk for impaired skin integrity will decrease Outcome: Progressing   Problem: Safety: Goal: Ability to remain free from injury will improve Outcome: Progressing   

## 2023-09-29 NOTE — Progress Notes (Signed)
 PT Cancellation Note  Patient Details Name: Heather Rogers MRN: [id number] DOB: 11-26-1945   Cancelled Treatment:    Reason Eval/Treat Not Completed: Patient's level of consciousness;Medical issues which prohibited therapy; will continue efforts to complete PT eval    [facility] 09/29/2023, 4:33 PM

## 2023-09-30 ENCOUNTER — Telehealth: Payer: Self-pay

## 2023-09-30 DIAGNOSIS — A0472 Enterocolitis due to Clostridium difficile, not specified as recurrent: Secondary | ICD-10-CM | POA: Diagnosis not present

## 2023-09-30 LAB — COMPREHENSIVE METABOLIC PANEL
ALT: 88 U/L — ABNORMAL HIGH (ref 0–44)
AST: 161 U/L — ABNORMAL HIGH (ref 15–41)
Albumin: 2 g/dL — ABNORMAL LOW (ref 3.5–5.0)
Alkaline Phosphatase: 130 U/L — ABNORMAL HIGH (ref 38–126)
Anion gap: 10 (ref 5–15)
BUN: 72 mg/dL — ABNORMAL HIGH (ref 8–23)
CO2: 16 mmol/L — ABNORMAL LOW (ref 22–32)
Calcium: 8.6 mg/dL — ABNORMAL LOW (ref 8.9–10.3)
Chloride: 114 mmol/L — ABNORMAL HIGH (ref 98–111)
Creatinine, Ser: 2.46 mg/dL — ABNORMAL HIGH (ref 0.44–1.00)
GFR, Estimated: 20 mL/min — ABNORMAL LOW (ref >60)
Glucose, Bld: 136 mg/dL — ABNORMAL HIGH (ref 70–99)
Potassium: 4.2 mmol/L (ref 3.5–5.1)
Sodium: 140 mmol/L (ref 135–145)
Total Bilirubin: 0.8 mg/dL (ref 0.0–1.2)
Total Protein: 6 g/dL — ABNORMAL LOW (ref 6.5–8.1)

## 2023-09-30 LAB — PREPARE RBC (CROSSMATCH)

## 2023-09-30 LAB — GASTROINTESTINAL PANEL BY PCR, STOOL (REPLACES STOOL CULTURE)

## 2023-09-30 LAB — GLUCOSE, CAPILLARY
Glucose-Capillary: 142 mg/dL — ABNORMAL HIGH (ref 70–99)
Glucose-Capillary: 121 mg/dL — ABNORMAL HIGH (ref 70–99)
Glucose-Capillary: 127 mg/dL — ABNORMAL HIGH (ref 70–99)
Glucose-Capillary: 177 mg/dL — ABNORMAL HIGH (ref 70–99)
Glucose-Capillary: 184 mg/dL — ABNORMAL HIGH (ref 70–99)

## 2023-09-30 LAB — CBC
HCT: 22.5 % — ABNORMAL LOW (ref 36.0–46.0)
Hemoglobin: 7.3 g/dL — ABNORMAL LOW (ref 12.0–15.0)
MCH: 25.9 pg — ABNORMAL LOW (ref 26.0–34.0)
MCHC: 32.4 g/dL (ref 30.0–36.0)
MCV: 79.8 fL — ABNORMAL LOW (ref 80.0–100.0)
Platelets: 269 10*3/uL (ref 150–400)
RBC: 2.82 MIL/uL — ABNORMAL LOW (ref 3.87–5.11)
RDW: 14.6 % (ref 11.5–15.5)
WBC: 13.6 10*3/uL — ABNORMAL HIGH (ref 4.0–10.5)
nRBC: 0.6 % — ABNORMAL HIGH (ref 0.0–0.2)

## 2023-09-30 MED ORDER — SODIUM CHLORIDE 0.9% IV SOLUTION: Freq: Once | INTRAVENOUS | Status: AC

## 2023-09-30 MED ORDER — SODIUM CHLORIDE 0.9 % IV SOLN: INTRAVENOUS | Status: AC

## 2023-09-30 NOTE — Progress Notes (Signed)
 Full note to follow Family requests removal of DNR. Patient has some encephalopathy. Hence I discontinued DNR 2 units PRBC ordered

## 2023-09-30 NOTE — TOC Initial Note (Signed)
 Transition of Care [facility]) - Initial/Assessment Note    Patient Details  Name: Heather Rogers MRN: [id number] Date of Birth: August 26, 1945  Transition of Care [facility]) CM/SW Contact:    Lanier Clam, RN Phone Number: 09/30/2023, 12:30 PM  Clinical Narrative:Spoke to dtr [name] about d/c plans-PTA patient needed rollator-rw sent to wrong address. Contacted [employer] rep Ian Malkin to correct & send correct rollator to patient's home-he will communicate with [name](dtr) directly. Will place order for rollator.Patient going forward will order dme through [employer]. [employer] rep Haywood Lasso already active-HHRN,PT/OT/aide/social work. Has own transport home.                  Expected Discharge Plan: Home w Home Health Services Barriers to Discharge: Continued Medical Work up   Patient Goals and CMS Choice Patient states their goals for this hospitalization and ongoing recovery are:: Home CMS Medicare.gov Compare Post Acute Care list provided to:: Patient Represenative (must comment) Choice offered to / list presented to : Sibling [place] ownership interest in [facility].provided to:: Adult Children    Expected Discharge Plan and Services   Discharge Planning Services: CM Consult Post Acute Care Choice: Home Health Living arrangements for the past 2 months: Single Family Home                 DME Arranged: Shower stool DME Agency: [employer] Date DME Agency Contacted: 09/30/23 Time DME Agency Contacted: 1227 Representative spoke with at DME Agency: jermaine HH Arranged: PT, OT, Nurse's Aide, Social Work, Charity fundraiser HH Agency: [facility] Date HH Agency Contacted: 09/30/23   Representative spoke with at [facility] Agency: Haywood Lasso  Prior Living Arrangements/Services Living arrangements for the past 2 months: Single Family Home Lives with:: Adult Children   Do you feel safe going back to the place where you live?: Yes          Current home services: DME, Home PT  (3n1,rw-[employer] they will switch out rw for a rollator)    Activities of Daily Living   ADL Screening (condition at time of admission) Independently performs ADLs?: No Does the patient have a NEW difficulty with bathing/dressing/toileting/self-feeding that is expected to last >3 days?: Yes (Initiates electronic notice to provider for possible OT consult) Does the patient have a NEW difficulty with getting in/out of bed, walking, or climbing stairs that is expected to last >3 days?: Yes (Initiates electronic notice to provider for possible PT consult) Does the patient have a NEW difficulty with communication that is expected to last >3 days?: Yes (Initiates electronic notice to provider for possible SLP consult) Is the patient deaf or have difficulty hearing?: No Does the patient have difficulty seeing, even when wearing glasses/contacts?: No Does the patient have difficulty concentrating, remembering, or making decisions?: Yes  Permission Sought/Granted Permission sought to share information with : Case Manager Permission granted to share information with : Yes, Verbal Permission Granted  Share Information with NAME: Case Manager           Emotional Assessment              Admission diagnosis:  Fever [R50.9] Sepsis without acute organ dysfunction, due to unspecified organism [facility]) [A41.9] Patient Active Problem List   Diagnosis Date Noted   Fever 09/28/2023   SIRS (systemic inflammatory response syndrome) (HCC) 09/28/2023   Acute metabolic encephalopathy 09/28/2023   Elevated LFTs 09/28/2023   Hyperglycemia 09/28/2023   Ascites 09/28/2023   Acute on chronic anemia 09/28/2023   Hyponatremia 09/28/2023  Chronic kidney disease, stage 3b (HCC) 02/18/2022   Hyperlipidemia associated with type 2 diabetes mellitus (HCC) 02/18/2022   Primary malignant neoplasm of breast with metastasis (HCC) 09/14/2021   Hypertrophic cardiomyopathy (HCC) 09/04/2021   Breast mass, left  09/04/2021   Cognitive decline 09/04/2021   Positive D dimer 09/03/2021   Pleural effusion 09/03/2021   Pleural effusion on left 09/02/2021   AKI (acute kidney injury) (HCC) 09/02/2021   Atherosclerotic cerebrovascular disease 05/23/2021   Anemia of chronic disease 05/23/2021   Gout 02/14/2021   Syncope 07/25/2020   Type 2 diabetes mellitus (A1c 6.4 on 2/5)  07/25/2020   Status post CVA 07/25/2020   Dyslipidemia 07/25/2020   Hypertension associated with diabetes (HCC) 07/25/2020   Coagulopathy (HCC) 07/25/2020   History of CVA 11/22/2018   PCP:  [name], MD Pharmacy:   [facility] [id number] - [address]  - [address] [place] [address] Phone: [phone] Fax: [phone]  [facility] - [place], [place] - [address] [place] [address] Phone: [phone] Fax: [phone]  [facility] - [place], [place] - [address] [place] [address] Phone: [phone] Fax: [phone]     Social Drivers of Health (SDOH) Social History: SDOH Screenings   Food Insecurity: No Food Insecurity (09/28/2023)  Housing: Low Risk  (09/28/2023)  Transportation Needs: No Transportation Needs (09/28/2023)  Utilities: Not At Risk (09/28/2023)  Alcohol Screen: Low Risk  (05/22/2023)  Depression (PHQ2-9): Low Risk  (08/22/2023)  Financial Resource Strain: Low Risk  (08/22/2023)  Physical Activity: Inactive (08/22/2023)  Social Connections: Unknown (09/28/2023)  Stress: No Stress Concern Present (08/22/2023)  Tobacco Use: Medium Risk (09/27/2023)  Health Literacy: Adequate Health Literacy (05/22/2023)   SDOH Interventions:     Readmission Risk Interventions     No data to display

## 2023-09-30 NOTE — Progress Notes (Signed)
 PROGRESS NOTE    Heather Rogers  ZOX:[id number] DOB: 11-14-1945 DOA: 09/27/2023 PCP: [name], MD    Brief Narrative:   Heather Rogers is a 78 y.o. female with past medical history significant for breast cancer with metastasis to liver, lung, bone with concern for thyroid involvement with nodule, DM2, HTN, gout, history of CVA, anemia of chronic kidney disease, HLD who presented to [facility] ED on 09/27/2023 from home via EMS with reports of increased weakness, lethargy.  Was recently prescribed antibiotics for UTI.  Patient also endorses loose stools but denies abdominal pain.  Stools reported dark but has been taking an iron supplement at home.  No recent sick contacts, no headache, no neck pain/stiffness, no skin lesions/rash.  Patient does not have a Port-A-Cath but does have an implanted loop recorder that is currently not operable.  In the ED, temperature 103.2 F, HR 82, RR 24, BP 134/85, SpO2 96% on room air.  WBC 11.7, hemoglobin 7.6, platelet count 290.  Sodium 132, potassium 4.6, chloride 103, CO2 18, glucose 387, BUN 54, creatinine 2.63, AST 235, ALT 96, total bilirubin 0.8.  Alkaline phosphatase 149.  Lactic acid 1.7.  INR 1.3.  COVID/influenza/RSV PCR negative.  Urinalysis unrevealing.  CT head without contrast with no acute finding.  CT abdomen/pelvis without contrast with no evidence of bowel obstruction or bowel inflammatory process, increased abdominal pelvic ascites, paired to recent study, small left pleural effusion with pleural nodularity consistent with metastatic disease, numerous hepatic metastatic lesions as seen on prior exam, lobulated left posterior pelvic soft tissue density probably prominent left ovary, noted left outer breast mass. Patient was given Tylenol, vancomycin, ceftriaxone, metronidazole, and 2.25 L LR boluses. TRH called to admit.   Assessment & Plan:   SIRS C. difficile colitis Met SIRS criteria at the time of presentation with  fever and slight tachypnea.  Recently diagnosed with UTI and started on outpatient antibiotics with cefadroxil.  WBC count borderline elevated.  No tachycardia, hypotension, or lactic acidosis to suggest sepsis.  No infectious source identified on imaging of chest and abdomen/pelvis.  Has ascites but no abdominal pain or tenderness to suggest SBP.  UA not suggestive of infection.  COVID/influenza/RSV PCR negative.  Chest x-ray with no acute cardiopulmonary disease process.  CT chest without contrast with no evidence of pneumonia, small bilateral pleural effusions, similar nodularity left major fissure likely related to pleural metastasis, diffuse hepatic metastasis, mediastinal adenopathy, grossly similar sternal osseous metastasis with pathologic fracture, left breast mass and overlying skin thickening. -- C. Difficile screen: + antigen, negative toxin; reflex C. difficile PCR: Pending -- GI PCR panel: Negative -- Blood cultures x 2: no growth x 2 days -- MRSA PCR negative -- Vancomycin 125 mg p.o. 4 times daily x 10 days -- CBC daily; monitor fever curve  Acute metabolic encephalopathy: Resolved Patient presenting with confusion; but able to follow commands and answer questions appropriately.  No nuchal rigidity suggest meningitis.  No focal neurological deficit on physical exam.  CT head without contrast with no acute findings.  Ammonia level 36. -- SLP eval: pending -- Aspiration/fall precautions -- Continue supportive care  AKI on CKD stage IIIb Likely prerenal from dehydration. Creatinine currently 2.6 and baseline 1.4 - 1.7.  CT without evidence of obstructive uropathy.   -- Cr 2.63>2.49>2.35 -- Avoid nephrotoxins, renal dose all medications -- continue IVF w/ NS at 75 mL/h -- BMP daily  Elevated LFTs AST 235, ALT 96, alk phos 149, T. bili  0.8 on admission.  Likely related to hepatic metastases as seen on CT.  No history of alcohol abuse.  Acute hepatitis panel negative. -- AST  235>213>302 -- ALT 96>87>111 -- Tbili 0.8>1.0>1.0 -- CMP daily  Hyponatremia: Resolved Sodium 132 on admission.  Etiology likely secondary to hypovolemic hyponatremia in the setting of poor oral intake in days preceding hospitalization. -- Na 846>962>952 -- NS at 75 mL/h  Ascites CT showing increased abdominopelvic ascites.  Likely malignant ascites given known metastatic breast cancer with hepatic mets.  Abdominal ultrasound with small volume ascites, unsafe for paracentesis/sampling per IR.   Generalized weakness -- Pending PT evaluation3 -- OT recommends SNF --TOC consulted for placement -- Fall precautions   Abnormal imaging finding CT abdomen pelvis showing lobulated left posterior pelvic soft tissue density suspicious for possible prominent left ovary.  -- Outpatient nonemergent pelvic ultrasound for further evaluation.  Breast cancer with metastasis to liver, bone, lung Follows with medical oncology, Dr. Pamelia Hoit.  Last seen in office 06/13/2023.  Outpatient scans showing progression of metastasis.  Has been on treatment with [name] with letrozole over the past year.   Type 2 diabetes mellitus, with hyperglycemia Hemoglobin A1c 7.4.  On metformin 500 mg p.o. daily at baseline. -- Hold oral hypoglycemics while inpatient -- Sensitive SSI for coverage -- CBGs q4h while NPO  Essential hypertension -- Amlodipine 10 mg p.o. daily -- Carvedilol 25 mg p.o. twice daily  Hyperlipidemia -- Hold home statin secondary to elevated LFTs  Anemia of chronic medical/renal disease Hemoglobin currently 7.6 and baseline and is in the 9-10 range.  FOBT negative.  Anemia panel with iron 28, TIBC 113, ferritin greater than 7500, folate 26.6, vitamin B12 3730.   -- Hgb 7.6>7.1>7.6>7.3 -- Transfuse for hemoglobin less than 7.0   History of CVA -- Hold aspirin in the setting of anemia  Gout -- Continue allopurinol 100 mg POP daily  Chronic pain/neuropathy -- Gabapentin 100 mg PO  TID   DVT prophylaxis: SCDs Start: 09/28/23 0307    Code Status: Do not attempt resuscitation (DNR) PRE-ARREST INTERVENTIONS DESIRED Family Communication: No family present at bedside this morning  Disposition Plan:  Level of care: Progressive Status is: Inpatient Remains inpatient appropriate because: IV antibiotics, pending paracentesis    Consultants:  None  Procedures:  Ultrasound paracentesis: Pending  Antimicrobials:  Vancomycin 2/28>> Cefepime 2/28>> Metronidazole 2/28>> Ceftriaxone 2/28 - 2/28   Subjective: Patient seen examined bedside, lying in bed.  Patient getting cleaned up, RN present at bedside.  No specific complaints this morning.  Now afebrile since yesterday after starting oral vancomycin.  C. difficile screen positive antigen, toxin negative awaiting reflex PCR panel.  GI PCR panel negative.  Continues with generalized weakness/fatigue, otherwise no other specific complaints, concerns or questions this morning.  Denies headache, no chest pain, no palpitations, no abdominal pain, no chills/night sweats, no nausea/vomiting.  No acute events overnight per nursing staff.  Objective: Vitals:   09/29/23 1812 09/29/23 2018 09/29/23 2348 09/30/23 0543  BP: (!) 120/52 (!) 127/56 (!) 110/53 115/61  Pulse: 78 70 66 75  Resp: (!) 24 20 (!) 22 19  Temp: 98.3 F (36.8 C) 98.5 F (36.9 C) 97.6 F (36.4 C) 97.8 F (36.6 C)  TempSrc: Oral Oral Oral Oral  SpO2: 95% 95% 98% 100%  Weight:      Height:       No intake or output data in the 24 hours ending 09/30/23 0950  Filed Weights   09/27/23 2015  Weight:  71.2 kg    Examination:  Physical Exam: GEN: NAD, alert and oriented x 3, chronically ill appearance, appears older than stated age HEENT: NCAT, PERRL, EOMI, sclera clear, MMM PULM: CTAB w/o wheezes/crackles, normal respiratory effort, on room air CV: RRR w/o M/G/R GI: abd soft, NTND, NABS, no R/G/M MSK: no peripheral edema, moves all extremities  independently NEURO: No focal neurological deficits PSYCH: normal mood/affect Integumentary: No concerning rashes/lesions/wounds noted on exposed skin surfaces.    Data Reviewed: I have personally reviewed following labs and imaging studies  CBC: Recent Labs  Lab 09/24/23 1031 09/27/23 2023 09/28/23 0344 09/29/23 0516 09/30/23 0540  WBC 8.6 11.7* 10.5 13.4* 13.6*  NEUTROABS  --  9.0*  --   --   --   HGB 8.7* 7.6* 7.1* 7.6* 7.3*  HCT 26.9* 22.6* 21.4* 23.5* 22.5*  MCV 81.5 78.2* 78.7* 80.2 79.8*  PLT 258 290 266 278 269   Basic Metabolic Panel: Recent Labs  Lab 09/24/23 1031 09/27/23 2023 09/28/23 0344 09/29/23 0516 09/30/23 0540  NA 135 132* 133* 137 140  K 4.8 4.6 4.4 4.8 4.2  CL 102 103 104 111 114*  CO2 20* 18* 21* 16* 16*  GLUCOSE 208* 387* 314* 157* 136*  BUN 35* 54* 50* 57* 72*  CREATININE 1.90* 2.63* 2.49* 2.35* 2.46*  CALCIUM 9.8 8.8* 8.5* 8.4* 8.6*  MG  --   --   --  2.3  --   PHOS  --   --   --  3.4  --    GFR: Estimated Creatinine Clearance: 17.3 mL/min (A) (by C-G formula based on SCr of 2.46 mg/dL (H)). Liver Function Tests: Recent Labs  Lab 09/27/23 2023 09/28/23 0344 09/29/23 0516 09/30/23 0540  AST 235* 213* 302* 161*  ALT 96* 87* 111* 88*  ALKPHOS 149* 139* 151* 130*  BILITOT 0.8 1.0 1.0 0.8  PROT 6.7 6.0* 6.0* 6.0*  ALBUMIN 2.6* 2.2* 2.2* 2.0*   No results for input(s): "LIPASE", "AMYLASE" in the last 168 hours. Recent Labs  Lab 09/28/23 1226  AMMONIA 36*   Coagulation Profile: Recent Labs  Lab 09/27/23 2023  INR 1.3*   Cardiac Enzymes: No results for input(s): "CKTOTAL", "CKMB", "CKMBINDEX", "TROPONINI" in the last 168 hours. BNP (last 3 results) No results for input(s): "PROBNP" in the last 8760 hours. HbA1C: Recent Labs    09/28/23 0344  HGBA1C 7.4*   CBG: Recent Labs  Lab 09/29/23 1604 09/29/23 2016 09/29/23 2345 09/30/23 0429 09/30/23 0729  GLUCAP 159* 171* 146* 142* 121*   Lipid Profile: No results for  input(s): "CHOL", "HDL", "LDLCALC", "TRIG", "CHOLHDL", "LDLDIRECT" in the last 72 hours. Thyroid Function Tests: No results for input(s): "TSH", "T4TOTAL", "FREET4", "T3FREE", "THYROIDAB" in the last 72 hours. Anemia Panel: Recent Labs    09/27/23 2239  VITAMINB12 3,730*  FOLATE 26.2  FERRITIN >7,500*  TIBC 113*  IRON 28  RETICCTPCT 0.6   Sepsis Labs: Recent Labs  Lab 09/27/23 2037  LATICACIDVEN 1.7    Recent Results (from the past 240 hours)  Resp panel by RT-PCR (RSV, Flu A&B, Covid) Anterior Nasal Swab     Status: None   Collection Time: 09/20/23  1:55 PM   Specimen: Anterior Nasal Swab  Result Value Ref Range Status   SARS Coronavirus 2 by RT PCR NEGATIVE NEGATIVE Final    Comment: (NOTE) SARS-CoV-2 target nucleic acids are NOT DETECTED.  The SARS-CoV-2 RNA is generally detectable in upper respiratory specimens during the acute phase of infection. The  lowest concentration of SARS-CoV-2 viral copies this assay can detect is 138 copies/mL. A negative result does not preclude SARS-Cov-2 infection and should not be used as the sole basis for treatment or other patient management decisions. A negative result may occur with  improper specimen collection/handling, submission of specimen other than nasopharyngeal swab, presence of viral mutation(s) within the areas targeted by this assay, and inadequate number of viral copies(<138 copies/mL). A negative result must be combined with clinical observations, patient history, and epidemiological information. The expected result is Negative.  Fact Sheet for Patients:  BloggerCourse.com  Fact Sheet for Healthcare Providers:  SeriousBroker.it  This test is no t yet approved or cleared by the [place] FDA and  has been authorized for detection and/or diagnosis of SARS-CoV-2 by FDA under an Emergency Use Authorization (EUA). This EUA will remain  in effect (meaning this test  can be used) for the duration of the COVID-19 declaration under Section 564(b)(1) of the Act, 21 U.S.C.section 360bbb-3(b)(1), unless the authorization is terminated  or revoked sooner.       Influenza A by PCR NEGATIVE NEGATIVE Final   Influenza B by PCR NEGATIVE NEGATIVE Final    Comment: (NOTE) The Xpert Xpress SARS-CoV-2/FLU/RSV plus assay is intended as an aid in the diagnosis of influenza from Nasopharyngeal swab specimens and should not be used as a sole basis for treatment. Nasal washings and aspirates are unacceptable for Xpert Xpress SARS-CoV-2/FLU/RSV testing.  Fact Sheet for Patients: BloggerCourse.com  Fact Sheet for Healthcare Providers: SeriousBroker.it  This test is not yet approved or cleared by the [place] FDA and has been authorized for detection and/or diagnosis of SARS-CoV-2 by FDA under an Emergency Use Authorization (EUA). This EUA will remain in effect (meaning this test can be used) for the duration of the COVID-19 declaration under Section 564(b)(1) of the Act, 21 U.S.C. section 360bbb-3(b)(1), unless the authorization is terminated or revoked.     Resp Syncytial Virus by PCR NEGATIVE NEGATIVE Final    Comment: (NOTE) Fact Sheet for Patients: BloggerCourse.com  Fact Sheet for Healthcare Providers: SeriousBroker.it  This test is not yet approved or cleared by the [place] FDA and has been authorized for detection and/or diagnosis of SARS-CoV-2 by FDA under an Emergency Use Authorization (EUA). This EUA will remain in effect (meaning this test can be used) for the duration of the COVID-19 declaration under Section 564(b)(1) of the Act, 21 U.S.C. section 360bbb-3(b)(1), unless the authorization is terminated or revoked.  Performed at [facility], [address], [place], [place] [address]   Urine Culture     Status:  Abnormal   Collection Time: 09/24/23  8:49 PM   Specimen: Urine, Clean Catch  Result Value Ref Range Status   Specimen Description URINE, CLEAN CATCH  Final   Special Requests   Final    NONE Performed at [facility], [address], [place], [place] [address]    Culture 20,000 COLONIES/mL ESCHERICHIA COLI (A)  Final   Report Status 09/27/2023 FINAL  Final   Organism ID, Bacteria ESCHERICHIA COLI (A)  Final      Susceptibility   Escherichia coli - MIC*    AMPICILLIN <=2 SENSITIVE Sensitive     CEFAZOLIN <=4 SENSITIVE Sensitive     CEFEPIME <=0.12 SENSITIVE Sensitive     CEFTRIAXONE <=0.25 SENSITIVE Sensitive     CIPROFLOXACIN <=0.25 SENSITIVE Sensitive     GENTAMICIN <=1 SENSITIVE Sensitive     IMIPENEM <=0.25 SENSITIVE Sensitive  NITROFURANTOIN <=16 SENSITIVE Sensitive     TRIMETH/SULFA <=20 SENSITIVE Sensitive     AMPICILLIN/SULBACTAM <=2 SENSITIVE Sensitive     PIP/TAZO <=4 SENSITIVE Sensitive ug/mL    * 20,000 COLONIES/mL ESCHERICHIA COLI  Blood Culture (routine x 2)     Status: None (Preliminary result)   Collection Time: 09/27/23  8:24 PM   Specimen: BLOOD  Result Value Ref Range Status   Specimen Description   Final    BLOOD BLOOD RIGHT HAND Performed at [facility], [address], [place], [place] [address]    Special Requests   Final    BOTTLES DRAWN AEROBIC AND ANAEROBIC Blood Culture adequate volume Performed at [facility], [address] [place], [place] [address]    Culture   Final    NO GROWTH 2 DAYS Performed at [facility], [address], [place], [place] [address]    Report Status PENDING  Incomplete  Resp panel by RT-PCR (RSV, Flu A&B, Covid) Anterior Nasal Swab     Status: None   Collection Time: 09/27/23  9:31 PM   Specimen: Anterior Nasal Swab  Result Value Ref Range Status   SARS Coronavirus 2 by RT PCR NEGATIVE NEGATIVE Final    Comment: (NOTE) SARS-CoV-2 target nucleic acids are NOT  DETECTED.  The SARS-CoV-2 RNA is generally detectable in upper respiratory specimens during the acute phase of infection. The lowest concentration of SARS-CoV-2 viral copies this assay can detect is 138 copies/mL. A negative result does not preclude SARS-Cov-2 infection and should not be used as the sole basis for treatment or other patient management decisions. A negative result may occur with  improper specimen collection/handling, submission of specimen other than nasopharyngeal swab, presence of viral mutation(s) within the areas targeted by this assay, and inadequate number of viral copies(<138 copies/mL). A negative result must be combined with clinical observations, patient history, and epidemiological information. The expected result is Negative.  Fact Sheet for Patients:  BloggerCourse.com  Fact Sheet for Healthcare Providers:  SeriousBroker.it  This test is no t yet approved or cleared by the [place] FDA and  has been authorized for detection and/or diagnosis of SARS-CoV-2 by FDA under an Emergency Use Authorization (EUA). This EUA will remain  in effect (meaning this test can be used) for the duration of the COVID-19 declaration under Section 564(b)(1) of the Act, 21 U.S.C.section 360bbb-3(b)(1), unless the authorization is terminated  or revoked sooner.       Influenza A by PCR NEGATIVE NEGATIVE Final   Influenza B by PCR NEGATIVE NEGATIVE Final    Comment: (NOTE) The Xpert Xpress SARS-CoV-2/FLU/RSV plus assay is intended as an aid in the diagnosis of influenza from Nasopharyngeal swab specimens and should not be used as a sole basis for treatment. Nasal washings and aspirates are unacceptable for Xpert Xpress SARS-CoV-2/FLU/RSV testing.  Fact Sheet for Patients: BloggerCourse.com  Fact Sheet for Healthcare Providers: SeriousBroker.it  This test is not yet  approved or cleared by the [place] FDA and has been authorized for detection and/or diagnosis of SARS-CoV-2 by FDA under an Emergency Use Authorization (EUA). This EUA will remain in effect (meaning this test can be used) for the duration of the COVID-19 declaration under Section 564(b)(1) of the Act, 21 U.S.C. section 360bbb-3(b)(1), unless the authorization is terminated or revoked.     Resp Syncytial Virus by PCR NEGATIVE NEGATIVE Final    Comment: (NOTE) Fact Sheet for Patients: BloggerCourse.com  Fact Sheet for Healthcare  Providers: SeriousBroker.it  This test is not yet approved or cleared by the [place] and has been authorized for detection and/or diagnosis of SARS-CoV-2 by FDA under an Emergency Use Authorization (EUA). This EUA will remain in effect (meaning this test can be used) for the duration of the COVID-19 declaration under Section 564(b)(1) of the Act, 21 U.S.C. section 360bbb-3(b)(1), unless the authorization is terminated or revoked.  Performed at [facility], [address], [place], [place] [address]   Urine Culture (for pregnant, neutropenic or urologic patients or patients with an indwelling urinary catheter)     Status: None   Collection Time: 09/27/23  9:42 PM   Specimen: Urine, Clean Catch  Result Value Ref Range Status   Specimen Description   Final    URINE, CLEAN CATCH Performed at [facility], [address] [place], [place] [address]    Special Requests   Final    NONE Performed at [facility], [address] [place], [place] [address]    Culture   Final    NO GROWTH Performed at [facility], [address], [place], [place] [address]    Report Status 09/29/2023 FINAL  Final  Blood Culture (routine x 2)     Status: None (Preliminary result)   Collection Time: 09/28/23  2:09 PM   Specimen: BLOOD LEFT ARM   Result Value Ref Range Status   Specimen Description   Final    BLOOD LEFT ARM Performed at [facility], [address], [place], [place] [address]    Special Requests   Final    BOTTLES DRAWN AEROBIC AND ANAEROBIC Blood Culture results may not be optimal due to an inadequate volume of blood received in culture bottles Performed at [facility], [address] [place], [place] [address]    Culture   Final    NO GROWTH < 24 HOURS Performed at [facility], [address], [place], [place] [address]    Report Status PENDING  Incomplete  MRSA Next Gen by PCR, Nasal     Status: None   Collection Time: 09/28/23  3:00 PM   Specimen: Nasal Mucosa; Nasal Swab  Result Value Ref Range Status   MRSA by PCR Next Gen NOT DETECTED NOT DETECTED Final    Comment: (NOTE) The GeneXpert MRSA Assay (FDA approved for NASAL specimens only), is one component of a comprehensive MRSA colonization surveillance program. It is not intended to diagnose MRSA infection nor to guide or monitor treatment for MRSA infections. Test performance is not FDA approved in patients less than 19 years old. Performed at [facility], [address] [place], [place] [address]   C Difficile Quick Screen w PCR reflex     Status: Abnormal   Collection Time: 09/29/23  9:18 AM   Specimen: STOOL  Result Value Ref Range Status   C Diff antigen POSITIVE (A) NEGATIVE Final   C Diff toxin NEGATIVE NEGATIVE Final   C Diff interpretation Results are indeterminate. See PCR results.  Final    Comment: Performed at [facility], [address] [place], [place] [address]  Gastrointestinal Panel by PCR , Stool     Status: None   Collection Time: 09/29/23  9:18 AM   Specimen: STOOL  Result Value Ref Range Status   Campylobacter species NOT DETECTED NOT DETECTED Final   Plesimonas shigelloides NOT DETECTED NOT DETECTED Final  Salmonella species NOT DETECTED NOT  DETECTED Final   Yersinia enterocolitica NOT DETECTED NOT DETECTED Final   Vibrio species NOT DETECTED NOT DETECTED Final   Vibrio cholerae NOT DETECTED NOT DETECTED Final   Enteroaggregative E coli (EAEC) NOT DETECTED NOT DETECTED Final   Enteropathogenic E coli (EPEC) NOT DETECTED NOT DETECTED Final   Enterotoxigenic E coli (ETEC) NOT DETECTED NOT DETECTED Final   Shiga like toxin producing E coli (STEC) NOT DETECTED NOT DETECTED Final   Shigella/Enteroinvasive E coli (EIEC) NOT DETECTED NOT DETECTED Final   Cryptosporidium NOT DETECTED NOT DETECTED Final   Cyclospora cayetanensis NOT DETECTED NOT DETECTED Final   Entamoeba histolytica NOT DETECTED NOT DETECTED Final   Giardia lamblia NOT DETECTED NOT DETECTED Final   Adenovirus F40/41 NOT DETECTED NOT DETECTED Final   Astrovirus NOT DETECTED NOT DETECTED Final   Norovirus GI/GII NOT DETECTED NOT DETECTED Final   Rotavirus A NOT DETECTED NOT DETECTED Final   Sapovirus (I, II, IV, and V) NOT DETECTED NOT DETECTED Final    Comment: Performed at [facility], [address] [place], [place] [address]         Radiology Studies: CT CHEST WO CONTRAST Result Date: 09/29/2023 CLINICAL DATA:  Respiratory illness nondiagnostic x-ray. EMR history includes left breast mass. Diabetes. Chronic kidney disease. Metabolic encephalopathy. * Tracking Code: BO * EXAM: CT CHEST WITHOUT CONTRAST TECHNIQUE: Multidetector CT imaging of the chest was performed following the standard protocol without IV contrast. RADIATION DOSE REDUCTION: This exam was performed according to the departmental dose-optimization program which includes automated exposure control, adjustment of the mA and/or kV according to patient size and/or use of iterative reconstruction technique. COMPARISON:  09/20/2023 CTA chest.  Abdominal CT 09/27/2023. FINDINGS: Cardiovascular: Aortic atherosclerosis. Tortuous thoracic aorta. Moderate cardiomegaly, without pericardial effusion.  Three vessel coronary artery calcification. Mediastinum/Nodes: No axillary adenopathy. 8 mm AP window node is similar and mildly enlarged on 42/2. Hilar regions poorly evaluated without intravenous contrast. Small hiatal hernia. Left internal mammary soft tissue fullness including on 39/2, similar. Lungs/Pleura: Small left and trace right pleural effusions, new since the prior chest CT. The left effusion is increased from 09/27/2023 abdominal CT. Mild motion degradation throughout. This is most significant inferiorly. Dependent left lower lobe compressive atelectasis. A nodule within the left major fissure of 11 mm on 53/2 is similar on 09/20/2023 CT. No evidence of pneumonia Upper Abdomen: Diffuse hepatic metastasis, suboptimally evaluated secondary to lack of IV contrast and the extent of motion. Trace perihepatic ascites. Musculoskeletal: Anasarca. Lateral left breast mass of 3.8 cm on 51/2 is relatively similar to the prior exam (when remeasured). Left breast skin thickening. Loop recorder within the anterior chest wall. Sternal body sclerotic metastasis with pathologic fracture, suboptimally evaluated. IMPRESSION: 1. Motion degradation 2. No evidence of pneumonia. 3. Small bilateral pleural effusions. Similar nodularity along the left major fissure, likely related to pleural metastasis. 4. Diffuse hepatic metastasis, suboptimally evaluated. 5. Relatively similar mild mediastinal adenopathy and left internal mammary soft tissue fullness (likely nodal metastasis). 6. Grossly similar sternal osseous metastasis with pathologic fracture. 7. Relatively similar left breast mass and overlying skin thickening. 8. Trace perihepatic ascites. Electronically Signed   By: Jeronimo Greaves M.D.   On: 09/29/2023 17:47        Scheduled Meds:  allopurinol  100 mg Oral Daily   amLODipine  10 mg Oral Daily   carvedilol  25 mg Oral BID WC   gabapentin  100 mg Oral TID   insulin  aspart  0-9 Units Subcutaneous Q4H    letrozole  2.5 mg Oral Daily   vancomycin  125 mg Oral QID   Continuous Infusions:  sodium chloride 100 mL/hr at 09/30/23 0902     LOS: 2 days    Time spent: 67 metastatic breast cancer minutes spent on chart review, discussion with nursing staff, consultants, updating family and interview/physical exam; more than 50% of that time was spent in counseling and/or coordination of care.    Alvira Philips [place], DO [facility] Available via Epic secure chat 7am-7pm After these hours, please refer to coverage provider listed on [email or web address] 09/30/2023, 9:50 AM

## 2023-09-30 NOTE — Evaluation (Signed)
 Clinical/Bedside Swallow Evaluation Patient Details  Name: Heather Rogers MRN: [id number] Date of Birth: 1945/11/22  Today's Date: 09/30/2023 Time: SLP Start Time (ACUTE ONLY): 1020 SLP Stop Time (ACUTE ONLY): 1045 SLP Time Calculation (min) (ACUTE ONLY): 25 min  Past Medical History:  Past Medical History:  Diagnosis Date   Chronic kidney disease, stage 3b (HCC)    Diabetes (HCC)    Hypertension    Pneumonia due to COVID-19 virus 07/25/2020   Primary malignant neoplasm of breast with metastasis (HCC) 09/14/2021   Stroke (cerebrum) (HCC)    Stroke (HCC) 05/15/2018   Past Surgical History:  Past Surgical History:  Procedure Laterality Date   CHEST TUBE INSERTION Left 09/07/2021   Procedure: CHEST TUBE INSERTION;  Surgeon: Lorin Glass, MD;  Location: [facility] ENDOSCOPY;  Service: Pulmonary;  Laterality: Left;   CHEST TUBE INSERTION N/A 03/19/2022   Procedure: REMOVAL PLEURAL DRAINAGE CATHETER;  Surgeon: Omar Person, MD;  Location: [facility] ENDOSCOPY;  Service: Pulmonary;  Laterality: N/A;   EYE SURGERY Left 04/06/2020   Implant placed   EYE SURGERY Right 03/30/2020   Implant placed    hysterectomy     LOOP RECORDER INSERTION N/A 05/20/2018   Procedure: LOOP RECORDER INSERTION;  Surgeon: Hillis Range, MD;  Location: [facility] INVASIVE CV LAB;  Service: Cardiovascular;  Laterality: N/A;   TEE WITHOUT CARDIOVERSION N/A 05/20/2018   Procedure: TRANSESOPHAGEAL ECHOCARDIOGRAM (TEE);  Surgeon: Quintella Reichert, MD;  Location: [facility] ENDOSCOPY;  Service: Cardiovascular;  Laterality: N/A;   HPI:  Patient is a 78 y.o. female with PMH: breast cancer with metastasis to the liver, lung, bone with concern for thyroid involvement with nodule, DM-2, HTN, gout, h/o CVA, anemia of CKD, HLD.  She presented to the hospital on 09/28/23 via EMS due to reports of increased weakness and lethargy. She was recently prescribed antibiotics for UTI. In ED, she was febrile at 103.2 F. UA unrevealing, CT head with no acute  finding, CT abd/pelvis did not show evidence of bowel obstruction or bowel inflammatory process. She was admitted with sepsios, acute metabolic encephalopathy, AKI.    Assessment / Plan / Recommendation  Clinical Impression  Patient is not currently presenting with clinical s/s of dysphagia as per this BSE. She does have a h/o GERD and SLP observed her to have immediate belching after sips of water. Patient herself told SLP that she has had poor appetite for a little while but unable to tell how much time it has been. When her dauighter arrived, she was able to clarify that patient has had poor PO intake for about a week. When SLP entered room, patient not talking but gesturing to her mouth to indicate she wanted to spit out some phlegm. She told SLP that she did eat better yesterday but that was because some family was present and "they push me to eat". Patient's swallow assessed via sips of water (thin liquids) and regular solids (graham crackers and toast with peanut butter). Swallow initiation appeared timely, no immediate or delayed coughing or throat clearing but she did exhibit a couple instances of fairly immediate belching. Somewhat prolonged mastication of graham cracker with peanut butter appeared largely [facility] as patient is missing some dentition and is still somewhat confused. SLP recommending to continue on current PO diet and no further skilled intervention needed at this time. SLP Visit Diagnosis: Dysphagia, unspecified (R13.10)    Aspiration Risk  Mild aspiration risk    Diet Recommendation Regular;Thin liquid    Liquid Administration via:  Straw;Cup Medication Administration: Whole meds with liquid Supervision: Patient able to self feed;Comment (needs encouragement to eat) Compensations: Slow rate;Small sips/bites;Minimize environmental distractions    Other  Recommendations Oral Care Recommendations: Oral care BID;Staff/trained caregiver to provide oral care    Recommendations for  follow up therapy are one component of a multi-disciplinary discharge planning process, led by the attending physician.  Recommendations may be updated based on patient status, additional functional criteria and insurance authorization.  Follow up Recommendations No SLP follow up      Assistance Recommended at Discharge    Functional Status Assessment Patient has not had a recent decline in their functional status  Frequency and Duration     N/A       Prognosis   N/a     Swallow Study   General Date of Onset: 09/28/23 HPI: Patient is a 78 y.o. female with PMH: breast cancer with metastasis to the liver, lung, bone with concern for thyroid involvement with nodule, DM-2, HTN, gout, h/o CVA, anemia of CKD, HLD.  She presented to the hospital on 09/28/23 via EMS due to reports of increased weakness and lethargy. She was recently prescribed antibiotics for UTI. In ED, she was febrile at 103.2 F. UA unrevealing, CT head with no acute finding, CT abd/pelvis did not show evidence of bowel obstruction or bowel inflammatory process. She was admitted with sepsios, acute metabolic encephalopathy, AKI. Type of Study: Bedside Swallow Evaluation Previous Swallow Assessment: none found Diet Prior to this Study: Regular;Thin liquids (Level 0) Temperature Spikes Noted: No Respiratory Status: Room air History of Recent Intubation: No Behavior/Cognition: Alert;Cooperative;Pleasant mood;Confused Oral Cavity Assessment: Within Functional Limits Oral Care Completed by SLP: No Oral Cavity - Dentition: Missing dentition Vision: Functional for self-feeding Self-Feeding Abilities: Able to feed self Patient Positioning: Upright in bed Baseline Vocal Quality: Normal Volitional Swallow: Able to elicit    Oral/Motor/Sensory Function Overall Oral Motor/Sensory Function: Within functional limits   Ice Chips     Thin Liquid Thin Liquid: Within functional limits Presentation: Straw;Self Fed    Nectar Thick      Honey Thick     Puree Puree: Not tested   Solid     Solid: Within functional limits Other Comments: slightly prolonged mastication likely from missing some dentition and patient still somewhat confused      Angela Nevin, MA, CCC-SLP Speech Therapy

## 2023-09-30 NOTE — Evaluation (Addendum)
 Physical Therapy Evaluation Patient Details Name: Heather Rogers MRN: [id number] DOB: 08/24/1945 Today's Date: 09/30/2023  History of Present Illness  Patient is a 78 year old female who presented on 2/28 with increased lethargy and weakness. Patient was admitted with sepsis, acute metabolic encephalopathy, AKI, cdiff. PMH: breast cancer with mets to liver, bone and lung,  Clinical Impression  Pt admitted with above diagnosis. Per daughter, pt is independent with mobility and ADLs (including driving to church) at baseline. Pt required mod assist for supine to sit, mod assist for sit to stand, min assist to pivot to recliner with RW. Pt fatigued quickly, she was not able to tolerate ambulation. Daughter reports family can provide 70* assist at home.  Pt currently with functional limitations due to the deficits listed below (see PT Problem List). Pt will benefit from acute skilled PT to increase their independence and safety with mobility to allow discharge.           If plan is discharge home, recommend the following: A lot of help with walking and/or transfers;A lot of help with bathing/dressing/bathroom;Assistance with cooking/housework;Supervision due to cognitive status;Assist for transportation;Help with stairs or ramp for entrance;Direct supervision/assist for medications management;Direct supervision/assist for financial management   Can travel by private vehicle        Equipment Recommendations Rollator (4 wheels),  transfer tub bench (daughter stated she'd order this privately)  Recommendations for Other Services       Functional Status Assessment Patient has had a recent decline in their functional status and demonstrates the ability to make significant improvements in function in a reasonable and predictable amount of time.     Precautions / Restrictions Precautions Precautions: Fall Precaution/Restrictions Comments: monitor vitals Restrictions Weight Bearing Restrictions  Per Provider Order: No      Mobility  Bed Mobility   Bed Mobility: Supine to Sit     Supine to sit: Mod assist     General bed mobility comments: assist to rasie trunk and pivot hips to EOB    Transfers Overall transfer level: Needs assistance Equipment used: Rolling walker (2 wheels) Transfers: Sit to/from Stand, Bed to chair/wheelchair/BSC Sit to Stand: Mod assist   Step pivot transfers: Min assist       General transfer comment: mod assist to power up, min A to take a few pivotal steps with RW bed to recliner, poorly controlled descent to recliner    Ambulation/Gait                  Stairs            Wheelchair Mobility     Tilt Bed    Modified Rankin (Stroke Patients Only)       Balance Overall balance assessment: Needs assistance Sitting-balance support: Feet supported, No upper extremity supported Sitting balance-Leahy Scale: Fair     Standing balance support: Bilateral upper extremity supported, During functional activity, Reliant on assistive device for balance Standing balance-Leahy Scale: Poor                               Pertinent Vitals/Pain Pain Assessment Pain Assessment: No/denies pain Breathing: normal Negative Vocalization: none Facial Expression: smiling or inexpressive Body Language: relaxed Consolability: no need to console PAINAD Score: 0    Home Living Family/patient expects to be discharged to:: Private residence Living Arrangements: Other relatives (grandson) Available Help at Discharge: Family;Available 24 hours/day Type of Home: House Home Access: Ramped  entrance       Home Layout: One level Home Equipment: BSC/3in1 Additional Comments: lives with grandson    Prior Function Prior Level of Function : Independent/Modified Independent             Mobility Comments: walks without AD, drives to church ADLs Comments: independent with bathing/dressing     Extremity/Trunk Assessment    Upper Extremity Assessment Upper Extremity Assessment: Defer to OT evaluation    Lower Extremity Assessment Lower Extremity Assessment: Overall WFL for tasks assessed    Cervical / Trunk Assessment Cervical / Trunk Assessment: Kyphotic;Other exceptions (rounded shoulders)  Communication   Communication Communication: No apparent difficulties    Cognition Arousal: Lethargic Behavior During Therapy: Flat affect   PT - Cognitive impairments: Memory, Attention                         Following commands: Impaired Following commands impaired: Follows one step commands with increased time     Cueing Cueing Techniques: Verbal cues, Tactile cues     General Comments      Exercises     Assessment/Plan    PT Assessment Patient needs continued PT services  PT Problem List Decreased activity tolerance;Decreased mobility;Decreased balance;Decreased knowledge of use of DME       PT Treatment Interventions Therapeutic activities;Therapeutic exercise;Gait training;Balance training;Patient/family education    PT Goals (Current goals can be found in the Care Plan section)  Acute Rehab PT Goals Patient Stated Goal: care for pt at home PT Goal Formulation: With family Time For Goal Achievement: 10/14/23 Potential to Achieve Goals: Good    Frequency Min 1X/week     Co-evaluation               AM-PAC PT "6 Clicks" Mobility  Outcome Measure Help needed turning from your back to your side while in a flat bed without using bedrails?: A Little Help needed moving from lying on your back to sitting on the side of a flat bed without using bedrails?: A Lot Help needed moving to and from a bed to a chair (including a wheelchair)?: A Little Help needed standing up from a chair using your arms (e.g., wheelchair or bedside chair)?: A Lot Help needed to walk in hospital room?: Total Help needed climbing 3-5 steps with a railing? : Total 6 Click Score: 12    End of  Session Equipment Utilized During Treatment: Gait belt Activity Tolerance: Patient tolerated treatment well;Patient limited by fatigue Patient left: in chair;with call bell/phone within reach;with family/visitor present Nurse Communication: Mobility status PT Visit Diagnosis: Difficulty in walking, not elsewhere classified (R26.2)    Time: 1610-9604 PT Time Calculation (min) (ACUTE ONLY): 29 min   Charges:   PT Evaluation $PT Eval Moderate Complexity: 1 Mod PT Treatments $Therapeutic Activity: 8-22 mins PT General Charges $$ ACUTE PT VISIT: 1 Visit        Tamala Ser PT 09/30/2023  Acute Rehabilitation Services  Office [phone]

## 2023-09-30 NOTE — Telephone Encounter (Signed)
 Called pt's daughter, [name] regarding MyChart message. Dr Pamelia Hoit has been made aware that pt is currently inpatient and he plans to go visit her today.   [name] expresses grief regarding the course of events for the patient's hospitalization. She states, "frankly I am pissed and It seems to me [facility] wants a lawsuit because this is the 3rd time I have had her go to the emergency room and they did nothing for her until now when she was almost dead."  She expressed she is also concerned about DNR status stating, "And how can they make her a DNR when she can't even do a POA because she has been so out of it." Explained to [place] her DNR limits and she states, "Yeah my mom doesn't know what any of that stuff means. I am so pissed."  Extended my apologies to Levan and let her know I would make MD aware of her concerns, and if there is anything further we can help with, she could call us. She verbalized thanks and agreement.

## 2023-10-01 ENCOUNTER — Other Ambulatory Visit (HOSPITAL_COMMUNITY): Payer: Self-pay

## 2023-10-01 ENCOUNTER — Ambulatory Visit: Payer: Medicare HMO | Admitting: Hematology and Oncology

## 2023-10-01 ENCOUNTER — Ambulatory Visit: Payer: Medicare HMO

## 2023-10-01 ENCOUNTER — Telehealth (HOSPITAL_COMMUNITY): Payer: Self-pay

## 2023-10-01 DIAGNOSIS — C50912 Malignant neoplasm of unspecified site of left female breast: Secondary | ICD-10-CM

## 2023-10-01 DIAGNOSIS — A0472 Enterocolitis due to Clostridium difficile, not specified as recurrent: Secondary | ICD-10-CM | POA: Diagnosis present

## 2023-10-01 DIAGNOSIS — C50919 Malignant neoplasm of unspecified site of unspecified female breast: Secondary | ICD-10-CM | POA: Diagnosis not present

## 2023-10-01 LAB — FERRITIN: Ferritin: >7500 ng/mL — ABNORMAL HIGH (ref 11–307)

## 2023-10-01 LAB — IRON AND TIBC
Iron: 49 ug/dL (ref 28–170)
Saturation Ratios: 42 % — ABNORMAL HIGH (ref 10.4–31.8)
TIBC: 116 ug/dL — ABNORMAL LOW (ref 250–450)
UIBC: 67 ug/dL

## 2023-10-01 LAB — COMPREHENSIVE METABOLIC PANEL
ALT: 68 U/L — ABNORMAL HIGH (ref 0–44)
AST: 85 U/L — ABNORMAL HIGH (ref 15–41)
Albumin: 2.2 g/dL — ABNORMAL LOW (ref 3.5–5.0)
Alkaline Phosphatase: 141 U/L — ABNORMAL HIGH (ref 38–126)
Anion gap: 11 (ref 5–15)
BUN: 78 mg/dL — ABNORMAL HIGH (ref 8–23)
CO2: 16 mmol/L — ABNORMAL LOW (ref 22–32)
Calcium: 8.3 mg/dL — ABNORMAL LOW (ref 8.9–10.3)
Chloride: 111 mmol/L (ref 98–111)
Creatinine, Ser: 2.07 mg/dL — ABNORMAL HIGH (ref 0.44–1.00)
GFR, Estimated: 24 mL/min — ABNORMAL LOW (ref >60)
Glucose, Bld: 142 mg/dL — ABNORMAL HIGH (ref 70–99)
Potassium: 4.2 mmol/L (ref 3.5–5.1)
Sodium: 138 mmol/L (ref 135–145)
Total Bilirubin: 1.1 mg/dL (ref 0.0–1.2)
Total Protein: 6.3 g/dL — ABNORMAL LOW (ref 6.5–8.1)

## 2023-10-01 LAB — CBC
HCT: 32.5 % — ABNORMAL LOW (ref 36.0–46.0)
Hemoglobin: 11.1 g/dL — ABNORMAL LOW (ref 12.0–15.0)
MCH: 27.3 pg (ref 26.0–34.0)
MCHC: 34.2 g/dL (ref 30.0–36.0)
MCV: 79.9 fL — ABNORMAL LOW (ref 80.0–100.0)
Platelets: 300 10*3/uL (ref 150–400)
RBC: 4.07 MIL/uL (ref 3.87–5.11)
RDW: 15.9 % — ABNORMAL HIGH (ref 11.5–15.5)
WBC: 17.8 10*3/uL — ABNORMAL HIGH (ref 4.0–10.5)
nRBC: 1.2 % — ABNORMAL HIGH (ref 0.0–0.2)

## 2023-10-01 LAB — GLUCOSE, CAPILLARY
Glucose-Capillary: 129 mg/dL — ABNORMAL HIGH (ref 70–99)
Glucose-Capillary: 166 mg/dL — ABNORMAL HIGH (ref 70–99)
Glucose-Capillary: 174 mg/dL — ABNORMAL HIGH (ref 70–99)
Glucose-Capillary: 181 mg/dL — ABNORMAL HIGH (ref 70–99)
Glucose-Capillary: 186 mg/dL — ABNORMAL HIGH (ref 70–99)
Glucose-Capillary: 208 mg/dL — ABNORMAL HIGH (ref 70–99)
Glucose-Capillary: 242 mg/dL — ABNORMAL HIGH (ref 70–99)

## 2023-10-01 LAB — TYPE AND SCREEN
ABO/RH(D): O POS
Antibody Screen: NEGATIVE
Unit division: 0
Unit division: 0

## 2023-10-01 LAB — BPAM RBC
Blood Product Expiration Date: 202503282359
Blood Product Expiration Date: 202503282359
ISSUE DATE / TIME: 202503031743
ISSUE DATE / TIME: 202503032207
Unit Type and Rh: 5100
Unit Type and Rh: 5100

## 2023-10-01 MED ORDER — SODIUM CHLORIDE 0.9 % IV SOLN: INTRAVENOUS | Status: DC

## 2023-10-01 NOTE — Telephone Encounter (Signed)
 Pharmacy Patient Advocate Encounter  Insurance verification completed.    The patient is insured through [place]. Patient has Medicare and is not eligible for a copay card, but may be able to apply for patient assistance or Medicare RX Payment Plan (Patient Must reach out to their plan, if eligible for payment plan), if available.    Ran test claim for Vancomycin 125 mg and the current 30 day co-pay is $90.30 due to deductible.   This test claim was processed through [facility]- copay amounts may vary at other pharmacies due to pharmacy/plan contracts, or as the patient moves through the different stages of their insurance plan.

## 2023-10-01 NOTE — TOC Progression Note (Addendum)
 Transition of Care [facility]) - Progression Note    Patient Details  Name: Heather Rogers MRN: [id number] Date of Birth: Jul 26, 1946  Transition of Care [facility]) CM/SW Contact  Emylee Decelle, Olegario Messier, RN Phone Number: 10/01/2023, 3:34 PM  Clinical Narrative:left vm for dtr [name] just to f/u on current d/c plans home w/HHC, & dme await call back.left vm to f/u with [employer] rep Zach on RW delivery from prior admission-await response.  -4p Per [employer] rep Zach dme of rw has been delivered since yesterday.      Expected Discharge Plan: Home w Home Health Services Barriers to Discharge: Continued Medical Work up  Expected Discharge Plan and Services   Discharge Planning Services: CM Consult Post Acute Care Choice: Home Health Living arrangements for the past 2 months: Single Family Home                 DME Arranged: Shower stool DME Agency: [employer] Date DME Agency Contacted: 09/30/23 Time DME Agency Contacted: 1227 Representative spoke with at DME Agency: Vaughan Basta HH Arranged: PT, OT, Nurse's Aide, Social Work, Therapist, music Agency: [facility] Date HH Agency Contacted: 09/30/23   Representative spoke with at [facility] Agency: Haywood Lasso   Social Determinants of Health (SDOH) Interventions SDOH Screenings   Food Insecurity: No Food Insecurity (09/28/2023)  Housing: Low Risk  (09/28/2023)  Transportation Needs: No Transportation Needs (09/28/2023)  Utilities: Not At Risk (09/28/2023)  Alcohol Screen: Low Risk  (05/22/2023)  Depression (PHQ2-9): Low Risk  (08/22/2023)  Financial Resource Strain: Low Risk  (08/22/2023)  Physical Activity: Inactive (08/22/2023)  Social Connections: Unknown (09/28/2023)  Stress: No Stress Concern Present (08/22/2023)  Tobacco Use: Medium Risk (09/27/2023)  Health Literacy: Adequate Health Literacy (05/22/2023)    Readmission Risk Interventions     No data to display

## 2023-10-01 NOTE — Progress Notes (Addendum)
 ONCOLOGY S: Patient appears to have improved significantly with regards to her mental status and confusion.  She is extremely pleasant but not completely oriented to time and place.  She is able to answer questions.  Family at bedside.  We conferenced patient's daughter while I was present.     10/01/2023    3:20 AM 10/01/2023   12:57 AM 09/30/2023   10:39 PM  Vitals with BMI  Systolic 134 149 161  Diastolic 67 76 65  Pulse 82 79 82      Latest Ref Rng & Units 10/01/2023    5:35 AM 09/30/2023    5:40 AM 09/29/2023    5:16 AM  CBC  WBC 4.0 - 10.5 K/uL 17.8  13.6  13.4   Hemoglobin 12.0 - 15.0 g/dL 09.6  7.3  7.6   Hematocrit 36.0 - 46.0 % 32.5  22.5  23.5   Platelets 150 - 400 K/uL 300  269  278       Latest Ref Rng & Units 10/01/2023    5:35 AM 09/30/2023    5:40 AM 09/29/2023    5:16 AM  CMP  Glucose 70 - 99 mg/dL 045  409  811   BUN 8 - 23 mg/dL 78  72  57   Creatinine 0.44 - 1.00 mg/dL 9.14  7.82  9.56   Sodium 135 - 145 mmol/L 138  140  137   Potassium 3.5 - 5.1 mmol/L 4.2  4.2  4.8   Chloride 98 - 111 mmol/L 111  114  111   CO2 22 - 32 mmol/L 16  16  16    Calcium 8.9 - 10.3 mg/dL 8.3  8.6  8.4   Total Protein 6.5 - 8.1 g/dL 6.3  6.0  6.0   Total Bilirubin 0.0 - 1.2 mg/dL 1.1  0.8  1.0   Alkaline Phos 38 - 126 U/L 141  130  151   AST 15 - 41 U/L 85  161  302   ALT 0 - 44 U/L 68  88  111    Examination: Generalized weakness, shortness of breath to minimal exertion, able to move all 4 extremities. CT CAP 09/20/2023: Progression of multifocal liver metastases liver lesion measuring 7.4 cm dominant mass previously was 5 cm, ascites  Assessment and plan: Metastatic breast cancer with liver metastases: Progressively gotten significantly weaker over the course of the month with CT scan showing progression of disease.  I discussed with the patient's family and with the patient in the room that the current treatment of Verzinio with letrozole is no longer working and therefore we will  discontinue it. We discussed different systemic options and determined that Enhertu may be the best option because she has 2+ positivity on the HER2 receptors.  It is however given by an IV infusion every 3 weeks.  We might start off giving it peripherally and then placing the port at a later point. CODE STATUS: Since the patient has some underlying confusion, based on family's request to remove the [facility].  We will however continue to have discussions regarding CODE STATUS and goals of care at a later point after she gets discharged. Elevated LFTs possibly due to liver metastases Renal failure secondary to underlying dehydration and infection Severe anemia: Current possibly contributing to the shortness of breath.  2 units of PRBC given in today's hemoglobin is 11.1.  Iron studies have been ordered. Fevers: Finally improved

## 2023-10-01 NOTE — Progress Notes (Signed)
 PROGRESS NOTE    Heather Rogers  UJW:[id number] DOB: May 16, 1946 DOA: 09/27/2023 PCP: [name], MD    Brief Narrative:   Heather Rogers is a 78 y.o. female with past medical history significant for breast cancer with metastasis to liver, lung, bone with concern for thyroid involvement with nodule, DM2, HTN, gout, history of CVA, anemia of chronic kidney disease, HLD who presented to [facility] ED on 09/27/2023 from home via EMS with reports of increased weakness, lethargy.  Was recently prescribed antibiotics for UTI.  Patient also endorses loose stools but denies abdominal pain.  Stools reported dark but has been taking an iron supplement at home.  No recent sick contacts, no headache, no neck pain/stiffness, no skin lesions/rash.  Patient does not have a Port-A-Cath but does have an implanted loop recorder that is currently not operable.  In the ED, temperature 103.2 F, HR 82, RR 24, BP 134/85, SpO2 96% on room air.  WBC 11.7, hemoglobin 7.6, platelet count 290.  Sodium 132, potassium 4.6, chloride 103, CO2 18, glucose 387, BUN 54, creatinine 2.63, AST 235, ALT 96, total bilirubin 0.8.  Alkaline phosphatase 149.  Lactic acid 1.7.  INR 1.3.  COVID/influenza/RSV PCR negative.  Urinalysis unrevealing.  CT head without contrast with no acute finding.  CT abdomen/pelvis without contrast with no evidence of bowel obstruction or bowel inflammatory process, increased abdominal pelvic ascites, paired to recent study, small left pleural effusion with pleural nodularity consistent with metastatic disease, numerous hepatic metastatic lesions as seen on prior exam, lobulated left posterior pelvic soft tissue density probably prominent left ovary, noted left outer breast mass. Patient was given Tylenol, vancomycin, ceftriaxone, metronidazole, and 2.25 L LR boluses. TRH called to admit.   Assessment & Plan:    C. difficile colitis Met SIRS criteria at the time of presentation with fever  and slight tachypnea.  Recently diagnosed with UTI and started on outpatient antibiotics with cefadroxil.  WBC count borderline elevated.  No tachycardia, hypotension, or lactic acidosis to suggest sepsis.  No infectious source identified on imaging of chest and abdomen/pelvis.  Has ascites but no abdominal pain or tenderness to suggest SBP.  UA not suggestive of infection.  COVID/influenza/RSV PCR negative.  Chest x-ray with no acute cardiopulmonary disease process.  CT chest without contrast with no evidence of pneumonia, small bilateral pleural effusions, similar nodularity left major fissure likely related to pleural metastasis, diffuse hepatic metastasis, mediastinal adenopathy, grossly similar sternal osseous metastasis with pathologic fracture, left breast mass and overlying skin thickening. -- C. Difficile screen: + antigen, negative toxin; reflex C. difficile PCR: Pending -- GI PCR panel: Negative -- Blood cultures x 2: no growth x 2 days -- MRSA PCR negative -- Vancomycin 125 mg p.o. 4 times daily x 10 days -- CBC daily; monitor fever curve  Acute metabolic encephalopathy: Resolved Patient presenting with confusion; but able to follow commands and answer questions appropriately.  No nuchal rigidity suggest meningitis.  No focal neurological deficit on physical exam.  CT head without contrast with no acute findings.  Ammonia level 36. -- SLP eval: Regular diet, thin liquids -- Aspiration/fall precautions -- Continue supportive care  AKI on CKD stage IIIb Likely prerenal from dehydration. Creatinine currently 2.6 and baseline 1.4 - 1.7.  CT without evidence of obstructive uropathy.   -- Cr 2.63>2.49>2.35>2.07 -- Avoid nephrotoxins, renal dose all medications -- continue IVF w/ NS at 75 mL/h -- BMP daily  Elevated LFTs: Improving AST 235, ALT 96, alk  phos 149, T. bili 0.8 on admission.  Likely related to hepatic metastases as seen on CT.  No history of alcohol abuse.  Acute hepatitis  panel negative. -- AST 235>213>302>161>85 -- ALT 96>87>111>88>68 -- Tbili 0.8>1.0>1.0>1.1 -- CMP daily  Hyponatremia: Resolved Sodium 132 on admission.  Etiology likely secondary to hypovolemic hyponatremia in the setting of poor oral intake in days preceding hospitalization. -- Na 132>133>137>138 -- NS at 75 mL/h  Ascites CT showing increased abdominopelvic ascites.  Likely malignant ascites given known metastatic breast cancer with hepatic mets.  Abdominal ultrasound with small volume ascites, unsafe for paracentesis/sampling per IR.   Generalized weakness -- PT recommends home health -- OT recommends SNF --TOC following -- Fall precautions   Abnormal imaging finding CT abdomen pelvis showing lobulated left posterior pelvic soft tissue density suspicious for possible prominent left ovary.  -- Outpatient nonemergent pelvic ultrasound for further evaluation.  Breast cancer with metastasis to liver, bone, lung Follows with medical oncology, Dr. Pamelia Hoit.  Last seen in office 06/13/2023.  Outpatient scans showing progression of metastasis.  Has been on treatment with [name] with letrozole over the past year that is no longer working given progression of disease and oncology plans to discontinue at this.   Type 2 diabetes mellitus, with hyperglycemia Hemoglobin A1c 7.4.  On metformin 500 mg p.o. daily at baseline. -- Hold oral hypoglycemics while inpatient -- Sensitive SSI for coverage -- CBGs q4h while NPO  Essential hypertension -- Amlodipine 10 mg p.o. daily -- Carvedilol 25 mg p.o. twice daily  Hyperlipidemia -- Hold home statin secondary to elevated LFTs  Anemia of chronic medical/renal disease Hemoglobin currently 7.6 and baseline and is in the 9-10 range.  FOBT negative.  Anemia panel with iron 28, TIBC 113, ferritin greater than 7500, folate 26.6, vitamin B12 3730.  Transfused 2 unit PRBCs by medical oncology on 09/30/2023. -- Hgb 7.6>7.1>7.6>7.3>11.1 -- Transfuse for  hemoglobin less than 7.0   History of CVA -- Hold aspirin in the setting of anemia  Gout -- Continue allopurinol 100 mg POP daily  Chronic pain/neuropathy -- Gabapentin 100 mg PO TID   DVT prophylaxis: SCDs Start: 09/28/23 0307    Code Status: Prior Family Communication: No family present at bedside this morning  Disposition Plan:  Level of care: Med-Surg Status is: Inpatient Remains inpatient appropriate because: IV fluids    Consultants:  None  Procedures:  none  Antimicrobials:  Vancomycin 2/28 - 2/28 Cefepime 3/1 - 3/2 Metronidazole 2/28 - 3/2 Ceftriaxone 2/28 - 2/28 Oral vancomycin 3/2>>   Subjective: Patient seen examined bedside, lying in bed.  No complaints this morning.  Remains slightly confused, remains afebrile.  Volume of bowel movements improving.  Continues on oral vancomycin.  No family present at bedside this morning.  Seen by medical oncology given her progression of metastatic disease.  Continues with generalized weakness/fatigue, otherwise no other specific complaints, concerns or questions this morning.  Denies headache, no chest pain, no palpitations, no abdominal pain, no chills/night sweats, no nausea/vomiting.  No acute events overnight per nursing staff.  Objective: Vitals:   09/30/23 2159 09/30/23 2239 10/01/23 0057 10/01/23 0320  BP: (!) 144/82 (!) 141/65 (!) 149/76 134/67  Pulse: 81 82 79 82  Resp: 18 20 20 20   Temp: 99.4 F (37.4 C) 99 F (37.2 C) 99.8 F (37.7 C) 98.9 F (37.2 C)  TempSrc: Oral Oral Oral   SpO2: 98% 97% 98% 96%  Weight:      Height:  Intake/Output Summary (Last 24 hours) at 10/01/2023 1125 Last data filed at 10/01/2023 0900 Gross per 24 hour  Intake 2532.16 ml  Output 875 ml  Net 1657.16 ml    Filed Weights   09/27/23 2015  Weight: 71.2 kg    Examination:  Physical Exam: GEN: NAD, alert, confused, chronically ill appearance, appears older than stated age HEENT: NCAT, PERRL, EOMI, sclera clear,  MMM PULM: CTAB w/o wheezes/crackles, normal respiratory effort, on room air CV: RRR w/o M/G/R GI: abd soft, NTND, NABS, no R/G/M MSK: no peripheral edema, moves all extremities independently NEURO: No focal neurological deficits PSYCH: normal mood/affect Integumentary: No concerning rashes/lesions/wounds noted on exposed skin surfaces.    Data Reviewed: I have personally reviewed following labs and imaging studies  CBC: Recent Labs  Lab 09/27/23 2023 09/28/23 0344 09/29/23 0516 09/30/23 0540 10/01/23 0535  WBC 11.7* 10.5 13.4* 13.6* 17.8*  NEUTROABS 9.0*  --   --   --   --   HGB 7.6* 7.1* 7.6* 7.3* 11.1*  HCT 22.6* 21.4* 23.5* 22.5* 32.5*  MCV 78.2* 78.7* 80.2 79.8* 79.9*  PLT 290 266 278 269 300   Basic Metabolic Panel: Recent Labs  Lab 09/27/23 2023 09/28/23 0344 09/29/23 0516 09/30/23 0540 10/01/23 0535  NA 132* 133* 137 140 138  K 4.6 4.4 4.8 4.2 4.2  CL 103 104 111 114* 111  CO2 18* 21* 16* 16* 16*  GLUCOSE 387* 314* 157* 136* 142*  BUN 54* 50* 57* 72* 78*  CREATININE 2.63* 2.49* 2.35* 2.46* 2.07*  CALCIUM 8.8* 8.5* 8.4* 8.6* 8.3*  MG  --   --  2.3  --   --   PHOS  --   --  3.4  --   --    GFR: Estimated Creatinine Clearance: 20.6 mL/min (A) (by C-G formula based on SCr of 2.07 mg/dL (H)). Liver Function Tests: Recent Labs  Lab 09/27/23 2023 09/28/23 0344 09/29/23 0516 09/30/23 0540 10/01/23 0535  AST 235* 213* 302* 161* 85*  ALT 96* 87* 111* 88* 68*  ALKPHOS 149* 139* 151* 130* 141*  BILITOT 0.8 1.0 1.0 0.8 1.1  PROT 6.7 6.0* 6.0* 6.0* 6.3*  ALBUMIN 2.6* 2.2* 2.2* 2.0* 2.2*   No results for input(s): "LIPASE", "AMYLASE" in the last 168 hours. Recent Labs  Lab 09/28/23 1226  AMMONIA 36*   Coagulation Profile: Recent Labs  Lab 09/27/23 2023  INR 1.3*   Cardiac Enzymes: No results for input(s): "CKTOTAL", "CKMB", "CKMBINDEX", "TROPONINI" in the last 168 hours. BNP (last 3 results) No results for input(s): "PROBNP" in the last 8760  hours. HbA1C: No results for input(s): "HGBA1C" in the last 72 hours.  CBG: Recent Labs  Lab 09/30/23 1605 09/30/23 1942 10/01/23 0009 10/01/23 0320 10/01/23 0730  GLUCAP 177* 184* 181* 166* 129*   Lipid Profile: No results for input(s): "CHOL", "HDL", "LDLCALC", "TRIG", "CHOLHDL", "LDLDIRECT" in the last 72 hours. Thyroid Function Tests: No results for input(s): "TSH", "T4TOTAL", "FREET4", "T3FREE", "THYROIDAB" in the last 72 hours. Anemia Panel: Recent Labs    10/01/23 0535  FERRITIN >7,500*  TIBC 116*  IRON 49   Sepsis Labs: Recent Labs  Lab 09/27/23 2037  LATICACIDVEN 1.7    Recent Results (from the past 240 hours)  Urine Culture     Status: Abnormal   Collection Time: 09/24/23  8:49 PM   Specimen: Urine, Clean Catch  Result Value Ref Range Status   Specimen Description URINE, CLEAN CATCH  Final   Special Requests  Final    NONE Performed at [facility], [address], [place], [place] [address]    Culture 20,000 COLONIES/mL ESCHERICHIA COLI (A)  Final   Report Status 09/27/2023 FINAL  Final   Organism ID, Bacteria ESCHERICHIA COLI (A)  Final      Susceptibility   Escherichia coli - MIC*    AMPICILLIN <=2 SENSITIVE Sensitive     CEFAZOLIN <=4 SENSITIVE Sensitive     CEFEPIME <=0.12 SENSITIVE Sensitive     CEFTRIAXONE <=0.25 SENSITIVE Sensitive     CIPROFLOXACIN <=0.25 SENSITIVE Sensitive     GENTAMICIN <=1 SENSITIVE Sensitive     IMIPENEM <=0.25 SENSITIVE Sensitive     NITROFURANTOIN <=16 SENSITIVE Sensitive     TRIMETH/SULFA <=20 SENSITIVE Sensitive     AMPICILLIN/SULBACTAM <=2 SENSITIVE Sensitive     PIP/TAZO <=4 SENSITIVE Sensitive ug/mL    * 20,000 COLONIES/mL ESCHERICHIA COLI  Blood Culture (routine x 2)     Status: None (Preliminary result)   Collection Time: 09/27/23  8:24 PM   Specimen: BLOOD  Result Value Ref Range Status   Specimen Description   Final    BLOOD BLOOD RIGHT HAND Performed at [facility], [address] [place], [place] [address]    Special Requests   Final    BOTTLES DRAWN AEROBIC AND ANAEROBIC Blood Culture adequate volume Performed at [facility], [address] [place], [place] [address]    Culture   Final    NO GROWTH 4 DAYS Performed at [facility], [address], [place], [place] [address]    Report Status PENDING  Incomplete  Resp panel by RT-PCR (RSV, Flu A&B, Covid) Anterior Nasal Swab     Status: None   Collection Time: 09/27/23  9:31 PM   Specimen: Anterior Nasal Swab  Result Value Ref Range Status   SARS Coronavirus 2 by RT PCR NEGATIVE NEGATIVE Final    Comment: (NOTE) SARS-CoV-2 target nucleic acids are NOT DETECTED.  The SARS-CoV-2 RNA is generally detectable in upper respiratory specimens during the acute phase of infection. The lowest concentration of SARS-CoV-2 viral copies this assay can detect is 138 copies/mL. A negative result does not preclude SARS-Cov-2 infection and should not be used as the sole basis for treatment or other patient management decisions. A negative result may occur with  improper specimen collection/handling, submission of specimen other than nasopharyngeal swab, presence of viral mutation(s) within the areas targeted by this assay, and inadequate number of viral copies(<138 copies/mL). A negative result must be combined with clinical observations, patient history, and epidemiological information. The expected result is Negative.  Fact Sheet for Patients:  BloggerCourse.com  Fact Sheet for Healthcare Providers:  SeriousBroker.it  This test is no t yet approved or cleared by the [place] FDA and  has been authorized for detection and/or diagnosis of SARS-CoV-2 by FDA under an Emergency Use Authorization (EUA). This EUA will remain  in effect (meaning this test can be used) for the duration of the COVID-19 declaration under Section 564(b)(1)  of the Act, 21 U.S.C.section 360bbb-3(b)(1), unless the authorization is terminated  or revoked sooner.       Influenza A by PCR NEGATIVE NEGATIVE Final   Influenza B by PCR NEGATIVE NEGATIVE Final    Comment: (NOTE) The Xpert Xpress SARS-CoV-2/FLU/RSV plus assay is intended as an aid in the diagnosis of influenza from Nasopharyngeal swab specimens and should not be used as a sole basis for treatment. Nasal  washings and aspirates are unacceptable for Xpert Xpress SARS-CoV-2/FLU/RSV testing.  Fact Sheet for Patients: BloggerCourse.com  Fact Sheet for Healthcare Providers: SeriousBroker.it  This test is not yet approved or cleared by the [place] FDA and has been authorized for detection and/or diagnosis of SARS-CoV-2 by FDA under an Emergency Use Authorization (EUA). This EUA will remain in effect (meaning this test can be used) for the duration of the COVID-19 declaration under Section 564(b)(1) of the Act, 21 U.S.C. section 360bbb-3(b)(1), unless the authorization is terminated or revoked.     Resp Syncytial Virus by PCR NEGATIVE NEGATIVE Final    Comment: (NOTE) Fact Sheet for Patients: BloggerCourse.com  Fact Sheet for Healthcare Providers: SeriousBroker.it  This test is not yet approved or cleared by the [place] FDA and has been authorized for detection and/or diagnosis of SARS-CoV-2 by FDA under an Emergency Use Authorization (EUA). This EUA will remain in effect (meaning this test can be used) for the duration of the COVID-19 declaration under Section 564(b)(1) of the Act, 21 U.S.C. section 360bbb-3(b)(1), unless the authorization is terminated or revoked.  Performed at [facility], [address] [place], [place] [address]   Urine Culture (for pregnant, neutropenic or urologic patients or patients with an indwelling urinary catheter)      Status: None   Collection Time: 09/27/23  9:42 PM   Specimen: Urine, Clean Catch  Result Value Ref Range Status   Specimen Description   Final    URINE, CLEAN CATCH Performed at [facility], [address], [place], [place] [address]    Special Requests   Final    NONE Performed at [facility], [address], [place], [place] [address]    Culture   Final    NO GROWTH Performed at [facility], [address] [place], [place] [address]    Report Status 09/29/2023 FINAL  Final  Blood Culture (routine x 2)     Status: None (Preliminary result)   Collection Time: 09/28/23  2:09 PM   Specimen: BLOOD LEFT ARM  Result Value Ref Range Status   Specimen Description   Final    BLOOD LEFT ARM Performed at [facility], [address] [place], [place] [address]    Special Requests   Final    BOTTLES DRAWN AEROBIC AND ANAEROBIC Blood Culture results may not be optimal due to an inadequate volume of blood received in culture bottles Performed at [facility], [address], [place], [place] [address]    Culture   Final    NO GROWTH 3 DAYS Performed at [facility], [address] [place], [place] [address]    Report Status PENDING  Incomplete  MRSA Next Gen by PCR, Nasal     Status: None   Collection Time: 09/28/23  3:00 PM   Specimen: Nasal Mucosa; Nasal Swab  Result Value Ref Range Status   MRSA by PCR Next Gen NOT DETECTED NOT DETECTED Final    Comment: (NOTE) The GeneXpert MRSA Assay (FDA approved for NASAL specimens only), is one component of a comprehensive MRSA colonization surveillance program. It is not intended to diagnose MRSA infection nor to guide or monitor treatment for MRSA infections. Test performance is not FDA approved in patients less than 39 years old. Performed at [facility], [address], [place], [place] [address]   C Difficile Quick Screen w PCR  reflex  Status: Abnormal   Collection Time: 09/29/23  9:18 AM   Specimen: STOOL  Result Value Ref Range Status   C Diff antigen POSITIVE (A) NEGATIVE Final   C Diff toxin NEGATIVE NEGATIVE Final   C Diff interpretation Results are indeterminate. See PCR results.  Final    Comment: Performed at [facility], [address] [place], [place] [address]  Gastrointestinal Panel by PCR , Stool     Status: None   Collection Time: 09/29/23  9:18 AM   Specimen: STOOL  Result Value Ref Range Status   Campylobacter species NOT DETECTED NOT DETECTED Final   Plesimonas shigelloides NOT DETECTED NOT DETECTED Final   Salmonella species NOT DETECTED NOT DETECTED Final   Yersinia enterocolitica NOT DETECTED NOT DETECTED Final   Vibrio species NOT DETECTED NOT DETECTED Final   Vibrio cholerae NOT DETECTED NOT DETECTED Final   Enteroaggregative E coli (EAEC) NOT DETECTED NOT DETECTED Final   Enteropathogenic E coli (EPEC) NOT DETECTED NOT DETECTED Final   Enterotoxigenic E coli (ETEC) NOT DETECTED NOT DETECTED Final   Shiga like toxin producing E coli (STEC) NOT DETECTED NOT DETECTED Final   Shigella/Enteroinvasive E coli (EIEC) NOT DETECTED NOT DETECTED Final   Cryptosporidium NOT DETECTED NOT DETECTED Final   Cyclospora cayetanensis NOT DETECTED NOT DETECTED Final   Entamoeba histolytica NOT DETECTED NOT DETECTED Final   Giardia lamblia NOT DETECTED NOT DETECTED Final   Adenovirus F40/41 NOT DETECTED NOT DETECTED Final   Astrovirus NOT DETECTED NOT DETECTED Final   Norovirus GI/GII NOT DETECTED NOT DETECTED Final   Rotavirus A NOT DETECTED NOT DETECTED Final   Sapovirus (I, II, IV, and V) NOT DETECTED NOT DETECTED Final    Comment: Performed at [facility], [address], [place], [place] [address]         Radiology Studies: CT CHEST WO CONTRAST Result Date: 09/29/2023 CLINICAL DATA:  Respiratory illness nondiagnostic x-ray. EMR history includes left breast  mass. Diabetes. Chronic kidney disease. Metabolic encephalopathy. * Tracking Code: BO * EXAM: CT CHEST WITHOUT CONTRAST TECHNIQUE: Multidetector CT imaging of the chest was performed following the standard protocol without IV contrast. RADIATION DOSE REDUCTION: This exam was performed according to the departmental dose-optimization program which includes automated exposure control, adjustment of the mA and/or kV according to patient size and/or use of iterative reconstruction technique. COMPARISON:  09/20/2023 CTA chest.  Abdominal CT 09/27/2023. FINDINGS: Cardiovascular: Aortic atherosclerosis. Tortuous thoracic aorta. Moderate cardiomegaly, without pericardial effusion. Three vessel coronary artery calcification. Mediastinum/Nodes: No axillary adenopathy. 8 mm AP window node is similar and mildly enlarged on 42/2. Hilar regions poorly evaluated without intravenous contrast. Small hiatal hernia. Left internal mammary soft tissue fullness including on 39/2, similar. Lungs/Pleura: Small left and trace right pleural effusions, new since the prior chest CT. The left effusion is increased from 09/27/2023 abdominal CT. Mild motion degradation throughout. This is most significant inferiorly. Dependent left lower lobe compressive atelectasis. A nodule within the left major fissure of 11 mm on 53/2 is similar on 09/20/2023 CT. No evidence of pneumonia Upper Abdomen: Diffuse hepatic metastasis, suboptimally evaluated secondary to lack of IV contrast and the extent of motion. Trace perihepatic ascites. Musculoskeletal: Anasarca. Lateral left breast mass of 3.8 cm on 51/2 is relatively similar to the prior exam (when remeasured). Left breast skin thickening. Loop recorder within the anterior chest wall. Sternal body sclerotic metastasis with pathologic fracture, suboptimally evaluated. IMPRESSION: 1. Motion degradation 2. No evidence of pneumonia. 3. Small bilateral  pleural effusions. Similar nodularity along the left major  fissure, likely related to pleural metastasis. 4. Diffuse hepatic metastasis, suboptimally evaluated. 5. Relatively similar mild mediastinal adenopathy and left internal mammary soft tissue fullness (likely nodal metastasis). 6. Grossly similar sternal osseous metastasis with pathologic fracture. 7. Relatively similar left breast mass and overlying skin thickening. 8. Trace perihepatic ascites. Electronically Signed   By: Jeronimo Greaves M.D.   On: 09/29/2023 17:47        Scheduled Meds:  allopurinol  100 mg Oral Daily   amLODipine  10 mg Oral Daily   carvedilol  25 mg Oral BID WC   gabapentin  100 mg Oral TID   insulin aspart  0-9 Units Subcutaneous Q4H   vancomycin  125 mg Oral QID   Continuous Infusions:  sodium chloride       LOS: 3 days    Time spent: 56 metastatic breast cancer minutes spent on chart review, discussion with nursing staff, consultants, updating family and interview/physical exam; more than 50% of that time was spent in counseling and/or coordination of care.    Alvira Philips [place], DO [facility] Available via Epic secure chat 7am-7pm After these hours, please refer to coverage provider listed on [email or web address] 10/01/2023, 11:25 AM

## 2023-10-02 ENCOUNTER — Inpatient Hospital Stay: Payer: Medicare HMO | Admitting: Hematology and Oncology

## 2023-10-02 DIAGNOSIS — A0472 Enterocolitis due to Clostridium difficile, not specified as recurrent: Secondary | ICD-10-CM | POA: Diagnosis not present

## 2023-10-02 LAB — CBC
HCT: 32.6 % — ABNORMAL LOW (ref 36.0–46.0)
Hemoglobin: 11.2 g/dL — ABNORMAL LOW (ref 12.0–15.0)
MCH: 27.9 pg (ref 26.0–34.0)
MCHC: 34.4 g/dL (ref 30.0–36.0)
MCV: 81.1 fL (ref 80.0–100.0)
Platelets: 323 10*3/uL (ref 150–400)
RBC: 4.02 MIL/uL (ref 3.87–5.11)
RDW: 16.5 % — ABNORMAL HIGH (ref 11.5–15.5)
WBC: 15.3 10*3/uL — ABNORMAL HIGH (ref 4.0–10.5)
nRBC: 1 % — ABNORMAL HIGH (ref 0.0–0.2)

## 2023-10-02 LAB — COMPREHENSIVE METABOLIC PANEL
ALT: 50 U/L — ABNORMAL HIGH (ref 0–44)
AST: 58 U/L — ABNORMAL HIGH (ref 15–41)
Albumin: 1.9 g/dL — ABNORMAL LOW (ref 3.5–5.0)
Alkaline Phosphatase: 133 U/L — ABNORMAL HIGH (ref 38–126)
Anion gap: 9 (ref 5–15)
BUN: 69 mg/dL — ABNORMAL HIGH (ref 8–23)
CO2: 14 mmol/L — ABNORMAL LOW (ref 22–32)
Calcium: 8.1 mg/dL — ABNORMAL LOW (ref 8.9–10.3)
Chloride: 114 mmol/L — ABNORMAL HIGH (ref 98–111)
Creatinine, Ser: 1.61 mg/dL — ABNORMAL HIGH (ref 0.44–1.00)
GFR, Estimated: 33 mL/min — ABNORMAL LOW (ref >60)
Glucose, Bld: 158 mg/dL — ABNORMAL HIGH (ref 70–99)
Potassium: 3.8 mmol/L (ref 3.5–5.1)
Sodium: 137 mmol/L (ref 135–145)
Total Bilirubin: 0.8 mg/dL (ref 0.0–1.2)
Total Protein: 5.7 g/dL — ABNORMAL LOW (ref 6.5–8.1)

## 2023-10-02 LAB — GLUCOSE, CAPILLARY
Glucose-Capillary: 150 mg/dL — ABNORMAL HIGH (ref 70–99)
Glucose-Capillary: 153 mg/dL — ABNORMAL HIGH (ref 70–99)
Glucose-Capillary: 183 mg/dL — ABNORMAL HIGH (ref 70–99)
Glucose-Capillary: 189 mg/dL — ABNORMAL HIGH (ref 70–99)
Glucose-Capillary: 208 mg/dL — ABNORMAL HIGH (ref 70–99)

## 2023-10-02 LAB — CULTURE, BLOOD (ROUTINE X 2)
Culture: NO GROWTH
Special Requests: ADEQUATE

## 2023-10-02 MED ORDER — SODIUM BICARBONATE 650 MG PO TABS
650.0000 mg | ORAL_TABLET | Freq: Two times a day (BID) | ORAL | Status: DC
  Administered 2023-10-02 (×2): 650 mg via ORAL
  Filled 2023-10-02 (×2): qty 1

## 2023-10-02 MED ORDER — SODIUM CHLORIDE 0.9 % IV SOLN: INTRAVENOUS | Status: DC

## 2023-10-02 NOTE — Plan of Care (Signed)
   Problem: Elimination: Goal: Will not experience complications related to bowel motility Outcome: Progressing   Problem: Safety: Goal: Ability to remain free from injury will improve Outcome: Progressing   Problem: Skin Integrity: Goal: Risk for impaired skin integrity will decrease Outcome: Progressing

## 2023-10-02 NOTE — Plan of Care (Signed)
  Problem: Coping: Goal: Ability to adjust to condition or change in health will improve Outcome: Progressing   Problem: Fluid Volume: Goal: Ability to maintain a balanced intake and output will improve Outcome: Progressing   Problem: Health Behavior/Discharge Planning: Goal: Ability to identify and utilize available resources and services will improve Outcome: Progressing Goal: Ability to manage health-related needs will improve Outcome: Progressing   Problem: Metabolic: Goal: Ability to maintain appropriate glucose levels will improve Outcome: Progressing   Problem: Nutritional: Goal: Maintenance of adequate nutrition will improve Outcome: Progressing Goal: Progress toward achieving an optimal weight will improve Outcome: Progressing   Problem: Skin Integrity: Goal: Risk for impaired skin integrity will decrease Outcome: Progressing   Problem: Tissue Perfusion: Goal: Adequacy of tissue perfusion will improve Outcome: Progressing   Problem: Education: Goal: Knowledge of General Education information will improve Description: Including pain rating scale, medication(s)/side effects and non-pharmacologic comfort measures Outcome: Progressing   Problem: Clinical Measurements: Goal: Ability to maintain clinical measurements within normal limits will improve Outcome: Progressing Goal: Will remain free from infection Outcome: Progressing Goal: Diagnostic test results will improve Outcome: Progressing Goal: Respiratory complications will improve Outcome: Progressing Goal: Cardiovascular complication will be avoided Outcome: Progressing   Problem: Activity: Goal: Risk for activity intolerance will decrease Outcome: Progressing   Problem: Nutrition: Goal: Adequate nutrition will be maintained Outcome: Progressing   Problem: Coping: Goal: Level of anxiety will decrease Outcome: Progressing   Problem: Elimination: Goal: Will not experience complications related to  bowel motility Outcome: Progressing Goal: Will not experience complications related to urinary retention Outcome: Progressing   Problem: Pain Managment: Goal: General experience of comfort will improve and/or be controlled Outcome: Progressing   Problem: Safety: Goal: Ability to remain free from injury will improve Outcome: Progressing   Problem: Skin Integrity: Goal: Risk for impaired skin integrity will decrease Outcome: Progressing

## 2023-10-02 NOTE — Progress Notes (Addendum)
 PROGRESS NOTE    Heather Rogers  ZOX:[id number] DOB: 12/16/1945 DOA: 09/27/2023 PCP: [name], MD    Brief Narrative:   Heather Rogers is a 78 y.o. female with past medical history significant for breast cancer with metastasis to liver, lung, bone with concern for thyroid involvement with nodule, DM2, HTN, gout, history of CVA, anemia of chronic kidney disease, HLD who presented to [facility] ED on 09/27/2023 from home via EMS with reports of increased weakness, lethargy.  Was recently prescribed antibiotics for UTI.  Patient also endorses loose stools but denies abdominal pain.  Stools reported dark but has been taking an iron supplement at home.  No recent sick contacts, no headache, no neck pain/stiffness, no skin lesions/rash.  Patient does not have a Port-A-Cath but does have an implanted loop recorder that is currently not operable.  In the ED, temperature 103.2 F, HR 82, RR 24, BP 134/85, SpO2 96% on room air.  WBC 11.7, hemoglobin 7.6, platelet count 290.  Sodium 132, potassium 4.6, chloride 103, CO2 18, glucose 387, BUN 54, creatinine 2.63, AST 235, ALT 96, total bilirubin 0.8.  Alkaline phosphatase 149.  Lactic acid 1.7.  INR 1.3.  COVID/influenza/RSV PCR negative.  Urinalysis unrevealing.  CT head without contrast with no acute finding.  CT abdomen/pelvis without contrast with no evidence of bowel obstruction or bowel inflammatory process, increased abdominal pelvic ascites, paired to recent study, small left pleural effusion with pleural nodularity consistent with metastatic disease, numerous hepatic metastatic lesions as seen on prior exam, lobulated left posterior pelvic soft tissue density probably prominent left ovary, noted left outer breast mass. Patient was given Tylenol, vancomycin, ceftriaxone, metronidazole, and 2.25 L LR boluses. TRH called to admit.   Assessment & Plan:    C. difficile colitis Met SIRS criteria at the time of presentation with fever  and slight tachypnea.  Recently diagnosed with UTI and started on outpatient antibiotics with cefadroxil.  WBC count borderline elevated.  No tachycardia, hypotension, or lactic acidosis to suggest sepsis.  No infectious source identified on imaging of chest and abdomen/pelvis.  Has ascites but no abdominal pain or tenderness to suggest SBP.  UA not suggestive of infection.  COVID/influenza/RSV PCR negative.  Chest x-ray with no acute cardiopulmonary disease process.  CT chest without contrast with no evidence of pneumonia, small bilateral pleural effusions, similar nodularity left major fissure likely related to pleural metastasis, diffuse hepatic metastasis, mediastinal adenopathy, grossly similar sternal osseous metastasis with pathologic fracture, left breast mass and overlying skin thickening. -- C. Difficile screen: + antigen, negative toxin -- GI PCR panel: Negative -- Blood cultures x 2: no growth x 2 days -- MRSA PCR negative -- Vancomycin 125 mg p.o. 4 times daily x 10 days -- CBC daily; monitor fever curve, bowel movements  Acute metabolic encephalopathy: Resolved Patient presenting with confusion; but able to follow commands and answer questions appropriately.  No nuchal rigidity suggest meningitis.  No focal neurological deficit on physical exam.  CT head without contrast with no acute findings.  Ammonia level 36. -- SLP eval: Regular diet, thin liquids -- Aspiration/fall precautions -- Continue supportive care  AKI on CKD stage IIIb Metabolic acidosis Likely prerenal from dehydration. Creatinine currently 2.6 and baseline 1.4 - 1.7.  CT without evidence of obstructive uropathy.   -- Cr 2.63>2.49>2.35>2.07>1.61 -- Avoid nephrotoxins, renal dose all medications -- continue IVF w/ NS at 75 mL/h -- Sodium bicarbonate 650 mg p.o. twice daily -- BMP daily  Elevated  LFTs: Improving AST 235, ALT 96, alk phos 149, T. bili 0.8 on admission.  Likely related to hepatic metastases as seen on  CT.  No history of alcohol abuse.  Acute hepatitis panel negative. -- AST 235>213>302>161>85>58 -- ALT 96>87>111>88>68>50 -- Tbili 0.8>1.0>1.0>1.1>0.8 -- CMP daily  Hyponatremia: Resolved Sodium 132 on admission.  Etiology likely secondary to hypovolemic hyponatremia in the setting of poor oral intake in days preceding hospitalization. -- Na 132>133>137>138>137 -- NS at 75 mL/h  Ascites CT showing increased abdominopelvic ascites.  Likely malignant ascites given known metastatic breast cancer with hepatic mets.  Abdominal ultrasound with small volume ascites, unsafe for paracentesis/sampling per IR.   Generalized weakness -- PT recommends home health -- OT recommends SNF -- TOC following -- Fall precautions   Abnormal imaging finding CT abdomen pelvis showing lobulated left posterior pelvic soft tissue density suspicious for possible prominent left ovary.  -- Outpatient nonemergent pelvic ultrasound for further evaluation.  Breast cancer with metastasis to liver, bone, lung Follows with medical oncology, Dr. Pamelia Hoit.  Last seen in office 06/13/2023.  Outpatient scans showing progression of metastasis.  Has been on treatment with [name] with letrozole over the past year that is no longer working given progression of disease and oncology plans to discontinue this.   Type 2 diabetes mellitus, with hyperglycemia Hemoglobin A1c 7.4.  On metformin 500 mg p.o. daily at baseline. -- Hold oral hypoglycemics while inpatient -- Sensitive SSI for coverage -- CBGs q4h while NPO  Essential hypertension -- Amlodipine 10 mg p.o. daily -- Carvedilol 25 mg p.o. twice daily  Hyperlipidemia -- Hold home statin secondary to elevated LFTs  Anemia of chronic medical/renal disease Hemoglobin currently 7.6 and baseline and is in the 9-10 range.  FOBT negative.  Anemia panel with iron 28, TIBC 113, ferritin greater than 7500, folate 26.6, vitamin B12 3730.  Transfused 2 unit PRBCs by medical oncology  on 09/30/2023. -- Hgb 7.6>7.1>7.6>7.3>11.1>11.2 -- Transfuse for hemoglobin less than 7.0   History of CVA -- Hold aspirin in the setting of anemia  Gout -- Continue allopurinol 100 mg POP daily  Chronic pain/neuropathy -- Gabapentin 100 mg PO TID   DVT prophylaxis: SCDs Start: 09/28/23 0307    Code Status: Full Code Family Communication: No family present at bedside this morning  Disposition Plan:  Level of care: Med-Surg Status is: Inpatient Remains inpatient appropriate because: IV fluids    Consultants:  None  Procedures:  none  Antimicrobials:  Vancomycin 2/28 - 2/28 Cefepime 3/1 - 3/2 Metronidazole 2/28 - 3/2 Ceftriaxone 2/28 - 2/28 Oral vancomycin 3/2>>   Subjective: Patient seen examined bedside, lying in bed.  No complaints this morning.  No family present at bedside.  3 bowel movements reported past 24 hours.  Continues on IV fluids and oral vancomycin.  Continues to be slightly confused, unable to correctly tell year.  No other complaints or concerns this morning.  Denies headache, no chest pain, no palpitations, no abdominal pain, no chills/night sweats, no nausea/vomiting.  No acute events overnight per nursing staff.  Objective: Vitals:   10/01/23 1217 10/01/23 2030 10/02/23 0354 10/02/23 0933  BP: 132/63 (!) 146/67 (!) 143/68 (!) 133/59  Pulse: 78 80 84 86  Resp: 18 20 20 20   Temp: 98.3 F (36.8 C) 99 F (37.2 C) 99.8 F (37.7 C)   TempSrc:      SpO2: 99% 98% 97% 97%  Weight:      Height:        Intake/Output Summary (  Last 24 hours) at 10/02/2023 1025 Last data filed at 10/02/2023 4401 Gross per 24 hour  Intake 705.26 ml  Output --  Net 705.26 ml    Filed Weights   09/27/23 2015  Weight: 71.2 kg    Examination:  Physical Exam: GEN: NAD, alert, confused, chronically ill appearance, appears older than stated age HEENT: NCAT, PERRL, EOMI, sclera clear, MMM PULM: CTAB w/o wheezes/crackles, normal respiratory effort, on room air CV: RRR  w/o M/G/R GI: abd soft, NTND, NABS, no R/G/M MSK: no peripheral edema, moves all extremities independently NEURO: No focal neurological deficits PSYCH: normal mood/affect Integumentary: No concerning rashes/lesions/wounds noted on exposed skin surfaces.    Data Reviewed: I have personally reviewed following labs and imaging studies  CBC: Recent Labs  Lab 09/27/23 2023 09/28/23 0344 09/29/23 0516 09/30/23 0540 10/01/23 0535 10/02/23 0533  WBC 11.7* 10.5 13.4* 13.6* 17.8* 15.3*  NEUTROABS 9.0*  --   --   --   --   --   HGB 7.6* 7.1* 7.6* 7.3* 11.1* 11.2*  HCT 22.6* 21.4* 23.5* 22.5* 32.5* 32.6*  MCV 78.2* 78.7* 80.2 79.8* 79.9* 81.1  PLT 290 266 278 269 300 323   Basic Metabolic Panel: Recent Labs  Lab 09/28/23 0344 09/29/23 0516 09/30/23 0540 10/01/23 0535 10/02/23 0533  NA 133* 137 140 138 137  K 4.4 4.8 4.2 4.2 3.8  CL 104 111 114* 111 114*  CO2 21* 16* 16* 16* 14*  GLUCOSE 314* 157* 136* 142* 158*  BUN 50* 57* 72* 78* 69*  CREATININE 2.49* 2.35* 2.46* 2.07* 1.61*  CALCIUM 8.5* 8.4* 8.6* 8.3* 8.1*  MG  --  2.3  --   --   --   PHOS  --  3.4  --   --   --    GFR: Estimated Creatinine Clearance: 26.4 mL/min (A) (by C-G formula based on SCr of 1.61 mg/dL (H)). Liver Function Tests: Recent Labs  Lab 09/28/23 0344 09/29/23 0516 09/30/23 0540 10/01/23 0535 10/02/23 0533  AST 213* 302* 161* 85* 58*  ALT 87* 111* 88* 68* 50*  ALKPHOS 139* 151* 130* 141* 133*  BILITOT 1.0 1.0 0.8 1.1 0.8  PROT 6.0* 6.0* 6.0* 6.3* 5.7*  ALBUMIN 2.2* 2.2* 2.0* 2.2* 1.9*   No results for input(s): "LIPASE", "AMYLASE" in the last 168 hours. Recent Labs  Lab 09/28/23 1226  AMMONIA 36*   Coagulation Profile: Recent Labs  Lab 09/27/23 2023  INR 1.3*   Cardiac Enzymes: No results for input(s): "CKTOTAL", "CKMB", "CKMBINDEX", "TROPONINI" in the last 168 hours. BNP (last 3 results) No results for input(s): "PROBNP" in the last 8760 hours. HbA1C: No results for input(s):  "HGBA1C" in the last 72 hours.  CBG: Recent Labs  Lab 10/01/23 1622 10/01/23 2027 10/01/23 2341 10/02/23 0352 10/02/23 0724  GLUCAP 242* 208* 186* 150* 153*   Lipid Profile: No results for input(s): "CHOL", "HDL", "LDLCALC", "TRIG", "CHOLHDL", "LDLDIRECT" in the last 72 hours. Thyroid Function Tests: No results for input(s): "TSH", "T4TOTAL", "FREET4", "T3FREE", "THYROIDAB" in the last 72 hours. Anemia Panel: Recent Labs    10/01/23 0535  FERRITIN >7,500*  TIBC 116*  IRON 49   Sepsis Labs: Recent Labs  Lab 09/27/23 2037  LATICACIDVEN 1.7    Recent Results (from the past 240 hours)  Urine Culture     Status: Abnormal   Collection Time: 09/24/23  8:49 PM   Specimen: Urine, Clean Catch  Result Value Ref Range Status   Specimen Description URINE, CLEAN CATCH  Final   Special Requests   Final    NONE Performed at [facility], [address] [place], [place] [address]    Culture 20,000 COLONIES/mL ESCHERICHIA COLI (A)  Final   Report Status 09/27/2023 FINAL  Final   Organism ID, Bacteria ESCHERICHIA COLI (A)  Final      Susceptibility   Escherichia coli - MIC*    AMPICILLIN <=2 SENSITIVE Sensitive     CEFAZOLIN <=4 SENSITIVE Sensitive     CEFEPIME <=0.12 SENSITIVE Sensitive     CEFTRIAXONE <=0.25 SENSITIVE Sensitive     CIPROFLOXACIN <=0.25 SENSITIVE Sensitive     GENTAMICIN <=1 SENSITIVE Sensitive     IMIPENEM <=0.25 SENSITIVE Sensitive     NITROFURANTOIN <=16 SENSITIVE Sensitive     TRIMETH/SULFA <=20 SENSITIVE Sensitive     AMPICILLIN/SULBACTAM <=2 SENSITIVE Sensitive     PIP/TAZO <=4 SENSITIVE Sensitive ug/mL    * 20,000 COLONIES/mL ESCHERICHIA COLI  Blood Culture (routine x 2)     Status: None   Collection Time: 09/27/23  8:24 PM   Specimen: BLOOD  Result Value Ref Range Status   Specimen Description   Final    BLOOD BLOOD RIGHT HAND Performed at [facility], [address], [place], [place] [address]    Special Requests    Final    BOTTLES DRAWN AEROBIC AND ANAEROBIC Blood Culture adequate volume Performed at [facility], [address], [place], [place] [address]    Culture   Final    NO GROWTH 5 DAYS Performed at [facility], [address] [place], [place] [address]    Report Status 10/02/2023 FINAL  Final  Resp panel by RT-PCR (RSV, Flu A&B, Covid) Anterior Nasal Swab     Status: None   Collection Time: 09/27/23  9:31 PM   Specimen: Anterior Nasal Swab  Result Value Ref Range Status   SARS Coronavirus 2 by RT PCR NEGATIVE NEGATIVE Final    Comment: (NOTE) SARS-CoV-2 target nucleic acids are NOT DETECTED.  The SARS-CoV-2 RNA is generally detectable in upper respiratory specimens during the acute phase of infection. The lowest concentration of SARS-CoV-2 viral copies this assay can detect is 138 copies/mL. A negative result does not preclude SARS-Cov-2 infection and should not be used as the sole basis for treatment or other patient management decisions. A negative result may occur with  improper specimen collection/handling, submission of specimen other than nasopharyngeal swab, presence of viral mutation(s) within the areas targeted by this assay, and inadequate number of viral copies(<138 copies/mL). A negative result must be combined with clinical observations, patient history, and epidemiological information. The expected result is Negative.  Fact Sheet for Patients:  BloggerCourse.com  Fact Sheet for Healthcare Providers:  SeriousBroker.it  This test is no t yet approved or cleared by the [place] FDA and  has been authorized for detection and/or diagnosis of SARS-CoV-2 by FDA under an Emergency Use Authorization (EUA). This EUA will remain  in effect (meaning this test can be used) for the duration of the COVID-19 declaration under Section 564(b)(1) of the Act, 21 U.S.C.section 360bbb-3(b)(1), unless the  authorization is terminated  or revoked sooner.       Influenza A by PCR NEGATIVE NEGATIVE Final   Influenza B by PCR NEGATIVE NEGATIVE Final    Comment: (NOTE) The Xpert Xpress SARS-CoV-2/FLU/RSV plus assay is intended as an aid in the diagnosis of influenza from Nasopharyngeal swab specimens and should not be used as  a sole basis for treatment. Nasal washings and aspirates are unacceptable for Xpert Xpress SARS-CoV-2/FLU/RSV testing.  Fact Sheet for Patients: BloggerCourse.com  Fact Sheet for Healthcare Providers: SeriousBroker.it  This test is not yet approved or cleared by the [place] FDA and has been authorized for detection and/or diagnosis of SARS-CoV-2 by FDA under an Emergency Use Authorization (EUA). This EUA will remain in effect (meaning this test can be used) for the duration of the COVID-19 declaration under Section 564(b)(1) of the Act, 21 U.S.C. section 360bbb-3(b)(1), unless the authorization is terminated or revoked.     Resp Syncytial Virus by PCR NEGATIVE NEGATIVE Final    Comment: (NOTE) Fact Sheet for Patients: BloggerCourse.com  Fact Sheet for Healthcare Providers: SeriousBroker.it  This test is not yet approved or cleared by the [place] FDA and has been authorized for detection and/or diagnosis of SARS-CoV-2 by FDA under an Emergency Use Authorization (EUA). This EUA will remain in effect (meaning this test can be used) for the duration of the COVID-19 declaration under Section 564(b)(1) of the Act, 21 U.S.C. section 360bbb-3(b)(1), unless the authorization is terminated or revoked.  Performed at [facility], [address], [place], [place] [address]   Urine Culture (for pregnant, neutropenic or urologic patients or patients with an indwelling urinary catheter)     Status: None   Collection Time: 09/27/23  9:42 PM    Specimen: Urine, Clean Catch  Result Value Ref Range Status   Specimen Description   Final    URINE, CLEAN CATCH Performed at [facility], [address] [place], [place] [address]    Special Requests   Final    NONE Performed at [facility], [address] [place], [place] [address]    Culture   Final    NO GROWTH Performed at [facility], [address] [place], [place] [address]    Report Status 09/29/2023 FINAL  Final  Blood Culture (routine x 2)     Status: None (Preliminary result)   Collection Time: 09/28/23  2:09 PM   Specimen: BLOOD LEFT ARM  Result Value Ref Range Status   Specimen Description   Final    BLOOD LEFT ARM Performed at [facility], [address] [place], [place] [address]    Special Requests   Final    BOTTLES DRAWN AEROBIC AND ANAEROBIC Blood Culture results may not be optimal due to an inadequate volume of blood received in culture bottles Performed at [facility], [address], [place], [place] [address]    Culture   Final    NO GROWTH 4 DAYS Performed at [facility], [address], [place], [place] [address]    Report Status PENDING  Incomplete  MRSA Next Gen by PCR, Nasal     Status: None   Collection Time: 09/28/23  3:00 PM   Specimen: Nasal Mucosa; Nasal Swab  Result Value Ref Range Status   MRSA by PCR Next Gen NOT DETECTED NOT DETECTED Final    Comment: (NOTE) The GeneXpert MRSA Assay (FDA approved for NASAL specimens only), is one component of a comprehensive MRSA colonization surveillance program. It is not intended to diagnose MRSA infection nor to guide or monitor treatment for MRSA infections. Test performance is not FDA approved in patients less than 47 years old. Performed at [facility], [address] [place], [place] [address]   C Difficile Quick Screen w  PCR reflex     Status: Abnormal   Collection Time: 09/29/23   9:18 AM   Specimen: STOOL  Result Value Ref Range Status   C Diff antigen POSITIVE (A) NEGATIVE Final   C Diff toxin NEGATIVE NEGATIVE Final   C Diff interpretation Results are indeterminate. See PCR results.  Final    Comment: Performed at [facility], [address], [place], [place] [address]  Gastrointestinal Panel by PCR , Stool     Status: None   Collection Time: 09/29/23  9:18 AM   Specimen: STOOL  Result Value Ref Range Status   Campylobacter species NOT DETECTED NOT DETECTED Final   Plesimonas shigelloides NOT DETECTED NOT DETECTED Final   Salmonella species NOT DETECTED NOT DETECTED Final   Yersinia enterocolitica NOT DETECTED NOT DETECTED Final   Vibrio species NOT DETECTED NOT DETECTED Final   Vibrio cholerae NOT DETECTED NOT DETECTED Final   Enteroaggregative E coli (EAEC) NOT DETECTED NOT DETECTED Final   Enteropathogenic E coli (EPEC) NOT DETECTED NOT DETECTED Final   Enterotoxigenic E coli (ETEC) NOT DETECTED NOT DETECTED Final   Shiga like toxin producing E coli (STEC) NOT DETECTED NOT DETECTED Final   Shigella/Enteroinvasive E coli (EIEC) NOT DETECTED NOT DETECTED Final   Cryptosporidium NOT DETECTED NOT DETECTED Final   Cyclospora cayetanensis NOT DETECTED NOT DETECTED Final   Entamoeba histolytica NOT DETECTED NOT DETECTED Final   Giardia lamblia NOT DETECTED NOT DETECTED Final   Adenovirus F40/41 NOT DETECTED NOT DETECTED Final   Astrovirus NOT DETECTED NOT DETECTED Final   Norovirus GI/GII NOT DETECTED NOT DETECTED Final   Rotavirus A NOT DETECTED NOT DETECTED Final   Sapovirus (I, II, IV, and V) NOT DETECTED NOT DETECTED Final    Comment: Performed at [facility], [address] [place], [place] [address]         Radiology Studies: No results found.       Scheduled Meds:  allopurinol  100 mg Oral Daily   amLODipine  10 mg Oral Daily   carvedilol  25 mg Oral BID WC   gabapentin  100 mg Oral TID   insulin aspart   0-9 Units Subcutaneous Q4H   sodium bicarbonate  650 mg Oral BID   vancomycin  125 mg Oral QID   Continuous Infusions:  sodium chloride Stopped (10/02/23 0939)     LOS: 4 days    Time spent: 56 metastatic breast cancer minutes spent on chart review, discussion with nursing staff, consultants, updating family and interview/physical exam; more than 50% of that time was spent in counseling and/or coordination of care.    Alvira Philips [place], DO [facility] Available via Epic secure chat 7am-7pm After these hours, please refer to coverage provider listed on [email or web address] 10/02/2023, 10:25 AM

## 2023-10-02 NOTE — Progress Notes (Signed)
 Physical Therapy Treatment Patient Details Name: Heather Rogers MRN: [id number] DOB: 08-06-1945 Today's Date: 10/02/2023   History of Present Illness Patient is a 78 year old female who presented on 2/28 with increased lethargy and weakness. Patient was admitted with sepsis, acute metabolic encephalopathy, AKI, cdiff. PMH: breast cancer with mets to liver, bone and lung,    PT Comments  Mod assist for supine to sit and for sit to stand. Pt stood about 10 seconds with a rollator, then BLEs buckled and she returned to sitting at edge of bed. Pt performed seated BLE strengthening exercises. She reports she sat in the recliner earlier today and is too fatigued to do more activity.     If plan is discharge home, recommend the following: A lot of help with walking and/or transfers;A lot of help with bathing/dressing/bathroom;Assistance with cooking/housework;Supervision due to cognitive status;Assist for transportation;Help with stairs or ramp for entrance;Direct supervision/assist for medications management;Direct supervision/assist for financial management   Can travel by private vehicle        Equipment Recommendations  Rollator (4 wheels)    Recommendations for Other Services       Precautions / Restrictions Precautions Precautions: Fall Precaution/Restrictions Comments: monitor vitals Restrictions Weight Bearing Restrictions Per Provider Order: No     Mobility  Bed Mobility Overal bed mobility: Needs Assistance Bed Mobility: Supine to Sit     Supine to sit: Mod assist Sit to supine: Mod assist   General bed mobility comments: assist to rasie trunk and pivot hips to EOB; assist for BLEs into bed    Transfers Overall transfer level: Needs assistance Equipment used: Rollator (4 wheels) Transfers: Sit to/from Stand Sit to Stand: Mod assist           General transfer comment: mod assist to power up, BLEs buckled after ~10 seconds standing    Ambulation/Gait                    Stairs             Wheelchair Mobility     Tilt Bed    Modified Rankin (Stroke Patients Only)       Balance Overall balance assessment: Needs assistance Sitting-balance support: Feet supported, No upper extremity supported Sitting balance-Leahy Scale: Fair     Standing balance support: Bilateral upper extremity supported, During functional activity, Reliant on assistive device for balance Standing balance-Leahy Scale: Poor                              Communication Communication Communication: No apparent difficulties  Cognition Arousal: Lethargic Behavior During Therapy: Flat affect   PT - Cognitive impairments: Memory, Attention                         Following commands: Impaired Following commands impaired: Follows one step commands with increased time    Cueing Cueing Techniques: Verbal cues, Tactile cues  Exercises  Knee extension; sitting; both; 10 reps; AROM    General Comments        Pertinent Vitals/Pain Pain Assessment Pain Assessment: No/denies pain Breathing: normal Negative Vocalization: none Facial Expression: smiling or inexpressive Body Language: relaxed Consolability: no need to console PAINAD Score: 0    Home Living                          Prior Function  PT Goals (current goals can now be found in the care plan section) Acute Rehab PT Goals Patient Stated Goal: care for pt at home PT Goal Formulation: With family Time For Goal Achievement: 10/14/23 Potential to Achieve Goals: Good Progress towards PT goals: Progressing toward goals    Frequency    Min 2X/week      PT Plan      Co-evaluation              AM-PAC PT "6 Clicks" Mobility   Outcome Measure  Help needed turning from your back to your side while in a flat bed without using bedrails?: A Little Help needed moving from lying on your back to sitting on the side of a flat bed without  using bedrails?: A Lot Help needed moving to and from a bed to a chair (including a wheelchair)?: A Little Help needed standing up from a chair using your arms (e.g., wheelchair or bedside chair)?: A Lot Help needed to walk in hospital room?: Total Help needed climbing 3-5 steps with a railing? : Total 6 Click Score: 12    End of Session Equipment Utilized During Treatment: Gait belt Activity Tolerance: Patient tolerated treatment well;Patient limited by fatigue Patient left: in bed;with call bell/phone within reach;with bed alarm set Nurse Communication: Mobility status PT Visit Diagnosis: Difficulty in walking, not elsewhere classified (R26.2)     Time: 0454-0981 PT Time Calculation (min) (ACUTE ONLY): 15 min  Charges:    $Therapeutic Activity: 8-22 mins PT General Charges $$ ACUTE PT VISIT: 1 Visit                     Tamala Ser PT 10/02/2023  Acute Rehabilitation Services  Office [phone]

## 2023-10-02 NOTE — Progress Notes (Incomplete)
 Introduction: PP is a 78 yo F with hx of metastatic cancer and stroke, presented to ED on 09/27/23 with generalized weakness.  History of present illness: Pt presented to ED for evaluation of fever, confusion, and generalized weakness. Pt also reported cough and poor PO intake. Unsure of duration of sickness. She was endorsing loose stools but denies abdominal pain. Reporting dark stools but was on an iron supplement at home. No recent sick contacts, no headache, no neck pain/stiffness. Started cefadroxil 500 mg BID after discharge from ED on 09/24/23 for presumed UTI.  Past medical history/allergies/family history pertinent to current illness:  - Hx metastatic breast cancer and malignant left pleural effusion, on [name] and letrozole - Hx CKD stage IIIb, on allopurinol 100 mg daily  - Hx stroke 6 years ago, on aspirin 81mg  daily - Allergy: sulfa abx (itching)  Social hx: - Quit smoking cigarettes 38 yrs ago. Started 53 yrs ago and has a 15 pack-year smoking history. - Denies alcohol or drug use.  Physical exam/relevant labs, diagnostic testing/imaging: - T 103.2 F -> 99.8, RR 24 -> 20, BP 134/85 -> 133/59, SpO2 96% on RA -> 97% - WBC 11.7 -> 15.3, hemoglobin 7.6 -> 11.2 (baseline 9-10), FOBT negative  - Sodium 132 -> 137, BUN 54 -> 69, creatinine 2.6 -> 1.61 (baseline 1.4), c, INR 1.3, lactate normal - COVID/influenza/RSV negative  - UA not suggestive of infection - C. Diff antigen positive, toxin negative, PCR pending  - MRSA negative   Problem based assessment and plan:  - C. difficile colitis:  A. Assessment: Severe C.diff infection likely secondary to cefadroxil use for UTI. C. Diff antigen positive, toxin negative, PCR pending. WBC increased to 15.3, Scr 1.61. Given Rocephin 2g IV x1 and Flagyl 500mg  IV x1 upon admission. Started vancomycin 750mg  IV Q48h and cefepime 2g IV Q24h on 3/1. Discontinued vanc and cefepime and switched to vancomycin 125 mg PO QID x10 days on 3/2 after  positive C. diff antigen result.  B. Plan: Discontinue cefadroxil. Continue vancomycin 125 mg PO QID until finishing the 10-day course.  - CBC daily. Monitor body temperature and bowel movements.  - AKI on CKD stage IIIb:  A. Assessment: Likely due to dehydration from diarrhea. Recovering with SCr decreases from 2.6 to 1.61 (baseline 1.4-1.7). Sodium 132 -> 137, potassium 4.6 -> 3.8, calcium 8.8 -> 8.1. NS infusion at 100 mL/h. No nephrotoxin is given at the moment.  B. Plan: Continue IVF infusion until diarrhea resolves. Start oral calcium citrate daily with 1-2g of element calcium. Hold any nephrotoxin until SCr returns to baseline. Consider reduce allopurinol and gabapentin dose based on Scr after it is stable. - BMP daily. Monitor input/output.  - Elevated LFTs:  A. Assessment: Possibly due to liver metastases of breast cancer. No history of alcohol use. Negative acute hepatitis panel. AST 235 -> 85, ALT 96 -> 68.  B. Plan: Consider changing [name] with letrozole to Enhertu to escalate cancer therapy. - CMP daily.

## 2023-10-03 ENCOUNTER — Telehealth: Payer: Self-pay | Admitting: Hematology and Oncology

## 2023-10-03 DIAGNOSIS — A0472 Enterocolitis due to Clostridium difficile, not specified as recurrent: Secondary | ICD-10-CM | POA: Diagnosis not present

## 2023-10-03 LAB — AMMONIA: Ammonia: 23 umol/L (ref 9–35)

## 2023-10-03 LAB — COMPREHENSIVE METABOLIC PANEL
ALT: 57 U/L — ABNORMAL HIGH (ref 0–44)
AST: 79 U/L — ABNORMAL HIGH (ref 15–41)
Albumin: 2.2 g/dL — ABNORMAL LOW (ref 3.5–5.0)
Alkaline Phosphatase: 165 U/L — ABNORMAL HIGH (ref 38–126)
Anion gap: 9 (ref 5–15)
BUN: 56 mg/dL — ABNORMAL HIGH (ref 8–23)
CO2: 14 mmol/L — ABNORMAL LOW (ref 22–32)
Calcium: 8.7 mg/dL — ABNORMAL LOW (ref 8.9–10.3)
Chloride: 117 mmol/L — ABNORMAL HIGH (ref 98–111)
Creatinine, Ser: 1.33 mg/dL — ABNORMAL HIGH (ref 0.44–1.00)
GFR, Estimated: 41 mL/min — ABNORMAL LOW (ref >60)
Glucose, Bld: 193 mg/dL — ABNORMAL HIGH (ref 70–99)
Potassium: 3.8 mmol/L (ref 3.5–5.1)
Sodium: 140 mmol/L (ref 135–145)
Total Bilirubin: 0.9 mg/dL (ref 0.0–1.2)
Total Protein: 6.4 g/dL — ABNORMAL LOW (ref 6.5–8.1)

## 2023-10-03 LAB — GLUCOSE, CAPILLARY
Glucose-Capillary: 159 mg/dL — ABNORMAL HIGH (ref 70–99)
Glucose-Capillary: 170 mg/dL — ABNORMAL HIGH (ref 70–99)
Glucose-Capillary: 173 mg/dL — ABNORMAL HIGH (ref 70–99)
Glucose-Capillary: 189 mg/dL — ABNORMAL HIGH (ref 70–99)
Glucose-Capillary: 191 mg/dL — ABNORMAL HIGH (ref 70–99)
Glucose-Capillary: 194 mg/dL — ABNORMAL HIGH (ref 70–99)
Glucose-Capillary: 255 mg/dL — ABNORMAL HIGH (ref 70–99)

## 2023-10-03 LAB — CBC
HCT: 35.4 % — ABNORMAL LOW (ref 36.0–46.0)
Hemoglobin: 12 g/dL (ref 12.0–15.0)
MCH: 27.5 pg (ref 26.0–34.0)
MCHC: 33.9 g/dL (ref 30.0–36.0)
MCV: 81.2 fL (ref 80.0–100.0)
Platelets: 342 10*3/uL (ref 150–400)
RBC: 4.36 MIL/uL (ref 3.87–5.11)
RDW: 17.5 % — ABNORMAL HIGH (ref 11.5–15.5)
WBC: 16.6 10*3/uL — ABNORMAL HIGH (ref 4.0–10.5)
nRBC: 1.1 % — ABNORMAL HIGH (ref 0.0–0.2)

## 2023-10-03 LAB — CULTURE, BLOOD (ROUTINE X 2): Culture: NO GROWTH

## 2023-10-03 MED ORDER — SODIUM CHLORIDE 0.9 % IV SOLN
INTRAVENOUS | Status: DC
Start: 2023-10-03 — End: 2023-10-03

## 2023-10-03 MED ORDER — SODIUM BICARBONATE 650 MG PO TABS
650.0000 mg | ORAL_TABLET | Freq: Three times a day (TID) | ORAL | Status: DC
  Administered 2023-10-03 – 2023-10-06 (×10): 650 mg via ORAL
  Filled 2023-10-03 (×10): qty 1

## 2023-10-03 NOTE — Telephone Encounter (Signed)
 Patient's daughter is aware of scheduled appointment times/dates

## 2023-10-03 NOTE — Plan of Care (Signed)
  Problem: Coping: °Goal: Level of anxiety will decrease °Outcome: Progressing °  °Problem: Elimination: °Goal: Will not experience complications related to bowel motility °Outcome: Progressing °  °Problem: Safety: °Goal: Ability to remain free from injury will improve °Outcome: Progressing °  °Problem: Skin Integrity: °Goal: Risk for impaired skin integrity will decrease °Outcome: Progressing °  °

## 2023-10-03 NOTE — Progress Notes (Addendum)
 PROGRESS NOTE    Heather Rogers  ZOX:[id number] DOB: 12-28-1945 DOA: 09/27/2023 PCP: [name], MD    Brief Narrative:   Heather Rogers is a 78 y.o. female with past medical history significant for breast cancer with metastasis to liver, lung, bone with concern for thyroid involvement with nodule, DM2, HTN, gout, history of CVA, anemia of chronic kidney disease, HLD who presented to [facility] ED on 09/27/2023 from home via EMS with reports of increased weakness, lethargy.  Was recently prescribed antibiotics for UTI.  Patient also endorses loose stools but denies abdominal pain.  Stools reported dark but has been taking an iron supplement at home.  No recent sick contacts, no headache, no neck pain/stiffness, no skin lesions/rash.  Patient does not have a Port-A-Cath but does have an implanted loop recorder that is currently not operable.  In the ED, temperature 103.2 F, HR 82, RR 24, BP 134/85, SpO2 96% on room air.  WBC 11.7, hemoglobin 7.6, platelet count 290.  Sodium 132, potassium 4.6, chloride 103, CO2 18, glucose 387, BUN 54, creatinine 2.63, AST 235, ALT 96, total bilirubin 0.8.  Alkaline phosphatase 149.  Lactic acid 1.7.  INR 1.3.  COVID/influenza/RSV PCR negative.  Urinalysis unrevealing.  CT head without contrast with no acute finding.  CT abdomen/pelvis without contrast with no evidence of bowel obstruction or bowel inflammatory process, increased abdominal pelvic ascites, paired to recent study, small left pleural effusion with pleural nodularity consistent with metastatic disease, numerous hepatic metastatic lesions as seen on prior exam, lobulated left posterior pelvic soft tissue density probably prominent left ovary, noted left outer breast mass. Patient was given Tylenol, vancomycin, ceftriaxone, metronidazole, and 2.25 L LR boluses. TRH called to admit.   Assessment & Plan:    C. difficile colitis Met SIRS criteria at the time of presentation with fever  and slight tachypnea.  Recently diagnosed with UTI and started on outpatient antibiotics with cefadroxil.  WBC count borderline elevated.  No tachycardia, hypotension, or lactic acidosis to suggest sepsis.  No infectious source identified on imaging of chest and abdomen/pelvis.  Has ascites but no abdominal pain or tenderness to suggest SBP.  UA not suggestive of infection.  COVID/influenza/RSV PCR negative.  Chest x-ray with no acute cardiopulmonary disease process.  CT chest without contrast with no evidence of pneumonia, small bilateral pleural effusions, similar nodularity left major fissure likely related to pleural metastasis, diffuse hepatic metastasis, mediastinal adenopathy, grossly similar sternal osseous metastasis with pathologic fracture, left breast mass and overlying skin thickening. -- C. Difficile screen: + antigen, negative toxin; PCR screen not sent -- GI PCR panel: Negative -- Blood cultures x 2: no growth x 4 days -- MRSA PCR negative -- Vancomycin 125 mg p.o. 4 times daily x 10 days -- CBC daily; monitor fever curve, bowel movements  Acute metabolic encephalopathy: Resolved Patient presenting with confusion; but able to follow commands and answer questions appropriately.  No nuchal rigidity suggest meningitis.  No focal neurological deficit on physical exam.  CT head without contrast with no acute findings.  Ammonia level 36. -- SLP eval: Regular diet, thin liquids -- Aspiration/fall precautions -- Continue supportive care  AKI on CKD stage IIIb Metabolic acidosis Likely prerenal from dehydration. Creatinine currently 2.6 and baseline 1.4 - 1.7.  CT without evidence of obstructive uropathy.   -- Cr 2.63>2.49>2.35>2.07>1.61>1.33 -- Avoid nephrotoxins, renal dose all medications -- Discontinue IV fluids today -- Sodium bicarbonate 650 mg p.o. TID -- BMP daily  Elevated  LFTs: Improving AST 235, ALT 96, alk phos 149, T. bili 0.8 on admission.  Likely related to hepatic  metastases as seen on CT.  No history of alcohol abuse.  Acute hepatitis panel negative. -- AST 235>213>302>161>85>58>79 -- ALT 96>87>111>88>68>50>57 -- Tbili 0.8>1.0>1.0>1.1>0.8 -- CMP daily  Hyponatremia: Resolved Sodium 132 on admission.  Etiology likely secondary to hypovolemic hyponatremia in the setting of poor oral intake in days preceding hospitalization. -- Na 132>133>137>138>137>140 -- DC IV fluids today  Ascites CT showing increased abdominopelvic ascites.  Likely malignant ascites given known metastatic breast cancer with hepatic mets.  Abdominal ultrasound with small volume ascites, unsafe for paracentesis/sampling per IR.   Generalized weakness -- PT recommends home health -- OT recommends SNF -- TOC following -- Fall precautions   Abnormal imaging finding CT abdomen pelvis showing lobulated left posterior pelvic soft tissue density suspicious for possible prominent left ovary.  -- Outpatient nonemergent pelvic ultrasound for further evaluation.  Breast cancer with metastasis to liver, bone, lung Follows with medical oncology, Dr. Pamelia Hoit.  Last seen in office 06/13/2023.  Outpatient scans showing progression of metastasis.  Has been on treatment with [name] with letrozole over the past year that is no longer working given progression of disease and oncology plans to discontinue this.   Type 2 diabetes mellitus, with hyperglycemia Hemoglobin A1c 7.4.  On metformin 500 mg p.o. daily at baseline. -- Hold oral hypoglycemics while inpatient -- Sensitive SSI for coverage -- CBGs q4h while NPO  Essential hypertension -- Amlodipine 10 mg p.o. daily -- Carvedilol 25 mg p.o. twice daily  Hyperlipidemia -- Hold home statin secondary to elevated LFTs  Anemia of chronic medical/renal disease Hemoglobin currently 7.6 and baseline and is in the 9-10 range.  FOBT negative.  Anemia panel with iron 28, TIBC 113, ferritin greater than 7500, folate 26.6, vitamin B12 3730.   Transfused 2 unit PRBCs by medical oncology on 09/30/2023. -- Hgb 7.6>7.1>7.6>7.3>11.1>11.2>12.0 -- Transfuse for hemoglobin less than 7.0   History of CVA -- Hold aspirin in the setting of anemia  Gout -- Continue allopurinol 100 mg POP daily  Chronic pain/neuropathy -- Gabapentin 100 mg PO TID   DVT prophylaxis: SCDs Start: 09/28/23 0307    Code Status: Full Code Family Communication: No family present at bedside this morning, updated patient's daughter [name] via telephone this morning  Disposition Plan:  Level of care: Med-Surg Status is: Inpatient Remains inpatient appropriate because: IV fluids, anticipate discharge home in 1-2 days    Consultants:  None  Procedures:  none  Antimicrobials:  Vancomycin 2/28 - 2/28 Cefepime 3/1 - 3/2 Metronidazole 2/28 - 3/2 Ceftriaxone 2/28 - 2/28 Oral vancomycin 3/2>>   Subjective: Patient seen examined bedside, lying in bed.  No complaints this morning.  No family present at bedside.  2 bowel movements reported past 24 hours, no further diarrhea over the last 2 days.  Eating breakfast, does not like the taste of her pancakes.  Remains on oral vancomycin.  No other complaints or concerns this morning.  Denies headache, no chest pain, no palpitations, no abdominal pain, no chills/night sweats, no nausea/vomiting.  No acute events overnight per nursing staff.  Objective: Vitals:   10/02/23 1628 10/02/23 2015 10/03/23 0412 10/03/23 1147  BP: (!) 114/52 135/62 (!) 141/64 130/63  Pulse: 79 78 83 74  Resp:  18 18   Temp:  97.9 F (36.6 C) 98.7 F (37.1 C)   TempSrc:      SpO2:  99% 98% 99%  Weight:  Height:        Intake/Output Summary (Last 24 hours) at 10/03/2023 1230 Last data filed at 10/02/2023 1624 Gross per 24 hour  Intake 475.88 ml  Output --  Net 475.88 ml    Filed Weights   09/27/23 2015  Weight: 71.2 kg    Examination:  Physical Exam: GEN: NAD, alert, confused, chronically ill appearance, appears older  than stated age HEENT: NCAT, PERRL, EOMI, sclera clear, MMM PULM: CTAB w/o wheezes/crackles, normal respiratory effort, on room air CV: RRR w/o M/G/R GI: abd soft, NTND, NABS, no R/G/M MSK: no peripheral edema, moves all extremities independently NEURO: No focal neurological deficits PSYCH: normal mood/affect Integumentary: No concerning rashes/lesions/wounds noted on exposed skin surfaces.    Data Reviewed: I have personally reviewed following labs and imaging studies  CBC: Recent Labs  Lab 09/27/23 2023 09/28/23 0344 09/29/23 0516 09/30/23 0540 10/01/23 0535 10/02/23 0533 10/03/23 0528  WBC 11.7*   < > 13.4* 13.6* 17.8* 15.3* 16.6*  NEUTROABS 9.0*  --   --   --   --   --   --   HGB 7.6*   < > 7.6* 7.3* 11.1* 11.2* 12.0  HCT 22.6*   < > 23.5* 22.5* 32.5* 32.6* 35.4*  MCV 78.2*   < > 80.2 79.8* 79.9* 81.1 81.2  PLT 290   < > 278 269 300 323 342   < > = values in this interval not displayed.   Basic Metabolic Panel: Recent Labs  Lab 09/29/23 0516 09/30/23 0540 10/01/23 0535 10/02/23 0533 10/03/23 0528  NA 137 140 138 137 140  K 4.8 4.2 4.2 3.8 3.8  CL 111 114* 111 114* 117*  CO2 16* 16* 16* 14* 14*  GLUCOSE 157* 136* 142* 158* 193*  BUN 57* 72* 78* 69* 56*  CREATININE 2.35* 2.46* 2.07* 1.61* 1.33*  CALCIUM 8.4* 8.6* 8.3* 8.1* 8.7*  MG 2.3  --   --   --   --   PHOS 3.4  --   --   --   --    GFR: Estimated Creatinine Clearance: 32 mL/min (A) (by C-G formula based on SCr of 1.33 mg/dL (H)). Liver Function Tests: Recent Labs  Lab 09/29/23 0516 09/30/23 0540 10/01/23 0535 10/02/23 0533 10/03/23 0528  AST 302* 161* 85* 58* 79*  ALT 111* 88* 68* 50* 57*  ALKPHOS 151* 130* 141* 133* 165*  BILITOT 1.0 0.8 1.1 0.8 0.9  PROT 6.0* 6.0* 6.3* 5.7* 6.4*  ALBUMIN 2.2* 2.0* 2.2* 1.9* 2.2*   No results for input(s): "LIPASE", "AMYLASE" in the last 168 hours. Recent Labs  Lab 09/28/23 1226 10/03/23 0528  AMMONIA 36* 23   Coagulation Profile: Recent Labs  Lab  09/27/23 2023  INR 1.3*   Cardiac Enzymes: No results for input(s): "CKTOTAL", "CKMB", "CKMBINDEX", "TROPONINI" in the last 168 hours. BNP (last 3 results) No results for input(s): "PROBNP" in the last 8760 hours. HbA1C: No results for input(s): "HGBA1C" in the last 72 hours.  CBG: Recent Labs  Lab 10/02/23 2001 10/03/23 0013 10/03/23 0407 10/03/23 0729 10/03/23 1149  GLUCAP 208* 189* 173* 194* 159*   Lipid Profile: No results for input(s): "CHOL", "HDL", "LDLCALC", "TRIG", "CHOLHDL", "LDLDIRECT" in the last 72 hours. Thyroid Function Tests: No results for input(s): "TSH", "T4TOTAL", "FREET4", "T3FREE", "THYROIDAB" in the last 72 hours. Anemia Panel: Recent Labs    10/01/23 0535  FERRITIN >7,500*  TIBC 116*  IRON 49   Sepsis Labs: Recent Labs  Lab 09/27/23 2037  LATICACIDVEN 1.7    Recent Results (from the past 240 hours)  Urine Culture     Status: Abnormal   Collection Time: 09/24/23  8:49 PM   Specimen: Urine, Clean Catch  Result Value Ref Range Status   Specimen Description URINE, CLEAN CATCH  Final   Special Requests   Final    NONE Performed at [facility], [address] [place], [place] [address]    Culture 20,000 COLONIES/mL ESCHERICHIA COLI (A)  Final   Report Status 09/27/2023 FINAL  Final   Organism ID, Bacteria ESCHERICHIA COLI (A)  Final      Susceptibility   Escherichia coli - MIC*    AMPICILLIN <=2 SENSITIVE Sensitive     CEFAZOLIN <=4 SENSITIVE Sensitive     CEFEPIME <=0.12 SENSITIVE Sensitive     CEFTRIAXONE <=0.25 SENSITIVE Sensitive     CIPROFLOXACIN <=0.25 SENSITIVE Sensitive     GENTAMICIN <=1 SENSITIVE Sensitive     IMIPENEM <=0.25 SENSITIVE Sensitive     NITROFURANTOIN <=16 SENSITIVE Sensitive     TRIMETH/SULFA <=20 SENSITIVE Sensitive     AMPICILLIN/SULBACTAM <=2 SENSITIVE Sensitive     PIP/TAZO <=4 SENSITIVE Sensitive ug/mL    * 20,000 COLONIES/mL ESCHERICHIA COLI  Blood Culture (routine x 2)     Status: None    Collection Time: 09/27/23  8:24 PM   Specimen: BLOOD  Result Value Ref Range Status   Specimen Description   Final    BLOOD BLOOD RIGHT HAND Performed at [facility], [address] [place], [place] [address]    Special Requests   Final    BOTTLES DRAWN AEROBIC AND ANAEROBIC Blood Culture adequate volume Performed at [facility], [address], [place], [place] [address]    Culture   Final    NO GROWTH 5 DAYS Performed at [facility], [address], [place], [place] [address]    Report Status 10/02/2023 FINAL  Final  Resp panel by RT-PCR (RSV, Flu A&B, Covid) Anterior Nasal Swab     Status: None   Collection Time: 09/27/23  9:31 PM   Specimen: Anterior Nasal Swab  Result Value Ref Range Status   SARS Coronavirus 2 by RT PCR NEGATIVE NEGATIVE Final    Comment: (NOTE) SARS-CoV-2 target nucleic acids are NOT DETECTED.  The SARS-CoV-2 RNA is generally detectable in upper respiratory specimens during the acute phase of infection. The lowest concentration of SARS-CoV-2 viral copies this assay can detect is 138 copies/mL. A negative result does not preclude SARS-Cov-2 infection and should not be used as the sole basis for treatment or other patient management decisions. A negative result may occur with  improper specimen collection/handling, submission of specimen other than nasopharyngeal swab, presence of viral mutation(s) within the areas targeted by this assay, and inadequate number of viral copies(<138 copies/mL). A negative result must be combined with clinical observations, patient history, and epidemiological information. The expected result is Negative.  Fact Sheet for Patients:  BloggerCourse.com  Fact Sheet for Healthcare Providers:  SeriousBroker.it  This test is no t yet approved or cleared by the [place] FDA and  has been authorized for detection and/or diagnosis  of SARS-CoV-2 by FDA under an Emergency Use Authorization (EUA). This EUA will remain  in effect (meaning this test can be used) for the duration of the COVID-19 declaration under Section 564(b)(1) of the Act, 21 U.S.C.section 360bbb-3(b)(1), unless the authorization is terminated  or revoked sooner.  Influenza A by PCR NEGATIVE NEGATIVE Final   Influenza B by PCR NEGATIVE NEGATIVE Final    Comment: (NOTE) The Xpert Xpress SARS-CoV-2/FLU/RSV plus assay is intended as an aid in the diagnosis of influenza from Nasopharyngeal swab specimens and should not be used as a sole basis for treatment. Nasal washings and aspirates are unacceptable for Xpert Xpress SARS-CoV-2/FLU/RSV testing.  Fact Sheet for Patients: BloggerCourse.com  Fact Sheet for Healthcare Providers: SeriousBroker.it  This test is not yet approved or cleared by the [place] FDA and has been authorized for detection and/or diagnosis of SARS-CoV-2 by FDA under an Emergency Use Authorization (EUA). This EUA will remain in effect (meaning this test can be used) for the duration of the COVID-19 declaration under Section 564(b)(1) of the Act, 21 U.S.C. section 360bbb-3(b)(1), unless the authorization is terminated or revoked.     Resp Syncytial Virus by PCR NEGATIVE NEGATIVE Final    Comment: (NOTE) Fact Sheet for Patients: BloggerCourse.com  Fact Sheet for Healthcare Providers: SeriousBroker.it  This test is not yet approved or cleared by the [place] FDA and has been authorized for detection and/or diagnosis of SARS-CoV-2 by FDA under an Emergency Use Authorization (EUA). This EUA will remain in effect (meaning this test can be used) for the duration of the COVID-19 declaration under Section 564(b)(1) of the Act, 21 U.S.C. section 360bbb-3(b)(1), unless the authorization is terminated  or revoked.  Performed at [facility], [address] [place], [place] [address]   Urine Culture (for pregnant, neutropenic or urologic patients or patients with an indwelling urinary catheter)     Status: None   Collection Time: 09/27/23  9:42 PM   Specimen: Urine, Clean Catch  Result Value Ref Range Status   Specimen Description   Final    URINE, CLEAN CATCH Performed at [facility], [address] [place], [place] [address]    Special Requests   Final    NONE Performed at [facility], [address] [place], [place] [address]    Culture   Final    NO GROWTH Performed at [facility], [address] [place], [place] [address]    Report Status 09/29/2023 FINAL  Final  Blood Culture (routine x 2)     Status: None (Preliminary result)   Collection Time: 09/28/23  2:09 PM   Specimen: BLOOD LEFT ARM  Result Value Ref Range Status   Specimen Description   Final    BLOOD LEFT ARM Performed at [facility], [address], [place], [place] [address]    Special Requests   Final    BOTTLES DRAWN AEROBIC AND ANAEROBIC Blood Culture results may not be optimal due to an inadequate volume of blood received in culture bottles Performed at [facility], [address] [place], [place] [address]    Culture   Final    NO GROWTH 4 DAYS Performed at [facility], [address], [place], [place] [address]    Report Status PENDING  Incomplete  MRSA Next Gen by PCR, Nasal     Status: None   Collection Time: 09/28/23  3:00 PM   Specimen: Nasal Mucosa; Nasal Swab  Result Value Ref Range Status   MRSA by PCR Next Gen NOT DETECTED NOT DETECTED Final    Comment: (NOTE) The GeneXpert MRSA Assay (FDA approved for NASAL specimens only), is one component of a comprehensive MRSA colonization surveillance program. It is not  intended to diagnose MRSA infection nor to guide or monitor treatment for  MRSA infections. Test performance is not FDA approved in patients less than 42 years old. Performed at [facility], [address], [place], [place] [address]   C Difficile Quick Screen w PCR reflex     Status: Abnormal   Collection Time: 09/29/23  9:18 AM   Specimen: STOOL  Result Value Ref Range Status   C Diff antigen POSITIVE (A) NEGATIVE Final   C Diff toxin NEGATIVE NEGATIVE Final   C Diff interpretation Results are indeterminate. See PCR results.  Final    Comment: Performed at [facility], [address] [place], [place] [address]  Gastrointestinal Panel by PCR , Stool     Status: None   Collection Time: 09/29/23  9:18 AM   Specimen: STOOL  Result Value Ref Range Status   Campylobacter species NOT DETECTED NOT DETECTED Final   Plesimonas shigelloides NOT DETECTED NOT DETECTED Final   Salmonella species NOT DETECTED NOT DETECTED Final   Yersinia enterocolitica NOT DETECTED NOT DETECTED Final   Vibrio species NOT DETECTED NOT DETECTED Final   Vibrio cholerae NOT DETECTED NOT DETECTED Final   Enteroaggregative E coli (EAEC) NOT DETECTED NOT DETECTED Final   Enteropathogenic E coli (EPEC) NOT DETECTED NOT DETECTED Final   Enterotoxigenic E coli (ETEC) NOT DETECTED NOT DETECTED Final   Shiga like toxin producing E coli (STEC) NOT DETECTED NOT DETECTED Final   Shigella/Enteroinvasive E coli (EIEC) NOT DETECTED NOT DETECTED Final   Cryptosporidium NOT DETECTED NOT DETECTED Final   Cyclospora cayetanensis NOT DETECTED NOT DETECTED Final   Entamoeba histolytica NOT DETECTED NOT DETECTED Final   Giardia lamblia NOT DETECTED NOT DETECTED Final   Adenovirus F40/41 NOT DETECTED NOT DETECTED Final   Astrovirus NOT DETECTED NOT DETECTED Final   Norovirus GI/GII NOT DETECTED NOT DETECTED Final   Rotavirus A NOT DETECTED NOT DETECTED Final   Sapovirus (I, II, IV, and V) NOT DETECTED NOT DETECTED Final    Comment: Performed at [facility],  [address] [place], [place] [address]         Radiology Studies: No results found.       Scheduled Meds:  allopurinol  100 mg Oral Daily   amLODipine  10 mg Oral Daily   carvedilol  25 mg Oral BID WC   gabapentin  100 mg Oral TID   insulin aspart  0-9 Units Subcutaneous Q4H   sodium bicarbonate  650 mg Oral TID   vancomycin  125 mg Oral QID   Continuous Infusions:  sodium chloride 100 mL/hr at 10/03/23 0727     LOS: 5 days    Time spent: 42 minutes spent on chart review, discussion with nursing staff, consultants, updating family and interview/physical exam; more than 50% of that time was spent in counseling and/or coordination of care.    Alvira Philips [place], DO [facility] Available via Epic secure chat 7am-7pm After these hours, please refer to coverage provider listed on [email or web address] 10/03/2023, 12:30 PM

## 2023-10-04 DIAGNOSIS — G9341 Metabolic encephalopathy: Secondary | ICD-10-CM | POA: Diagnosis not present

## 2023-10-04 DIAGNOSIS — N179 Acute kidney failure, unspecified: Secondary | ICD-10-CM | POA: Diagnosis not present

## 2023-10-04 DIAGNOSIS — C787 Secondary malignant neoplasm of liver and intrahepatic bile duct: Secondary | ICD-10-CM

## 2023-10-04 DIAGNOSIS — N1832 Chronic kidney disease, stage 3b: Secondary | ICD-10-CM

## 2023-10-04 DIAGNOSIS — R180 Malignant ascites: Secondary | ICD-10-CM

## 2023-10-04 DIAGNOSIS — D649 Anemia, unspecified: Secondary | ICD-10-CM | POA: Diagnosis not present

## 2023-10-04 DIAGNOSIS — R7989 Other specified abnormal findings of blood chemistry: Secondary | ICD-10-CM

## 2023-10-04 DIAGNOSIS — A0472 Enterocolitis due to Clostridium difficile, not specified as recurrent: Secondary | ICD-10-CM | POA: Diagnosis not present

## 2023-10-04 LAB — CBC
HCT: 37.4 % (ref 36.0–46.0)
Hemoglobin: 12.8 g/dL (ref 12.0–15.0)
MCH: 27.8 pg (ref 26.0–34.0)
MCHC: 34.2 g/dL (ref 30.0–36.0)
MCV: 81.1 fL (ref 80.0–100.0)
Platelets: 295 10*3/uL (ref 150–400)
RBC: 4.61 MIL/uL (ref 3.87–5.11)
RDW: 17.9 % — ABNORMAL HIGH (ref 11.5–15.5)
WBC: 14 10*3/uL — ABNORMAL HIGH (ref 4.0–10.5)
nRBC: 0.6 % — ABNORMAL HIGH (ref 0.0–0.2)

## 2023-10-04 LAB — COMPREHENSIVE METABOLIC PANEL
ALT: 55 U/L — ABNORMAL HIGH (ref 0–44)
AST: 78 U/L — ABNORMAL HIGH (ref 15–41)
Albumin: 2.2 g/dL — ABNORMAL LOW (ref 3.5–5.0)
Alkaline Phosphatase: 149 U/L — ABNORMAL HIGH (ref 38–126)
Anion gap: 11 (ref 5–15)
BUN: 45 mg/dL — ABNORMAL HIGH (ref 8–23)
CO2: 16 mmol/L — ABNORMAL LOW (ref 22–32)
Calcium: 8.3 mg/dL — ABNORMAL LOW (ref 8.9–10.3)
Chloride: 116 mmol/L — ABNORMAL HIGH (ref 98–111)
Creatinine, Ser: 1.06 mg/dL — ABNORMAL HIGH (ref 0.44–1.00)
GFR, Estimated: 54 mL/min — ABNORMAL LOW (ref >60)
Glucose, Bld: 191 mg/dL — ABNORMAL HIGH (ref 70–99)
Potassium: 4.1 mmol/L (ref 3.5–5.1)
Sodium: 143 mmol/L (ref 135–145)
Total Bilirubin: 0.9 mg/dL (ref 0.0–1.2)
Total Protein: 6 g/dL — ABNORMAL LOW (ref 6.5–8.1)

## 2023-10-04 LAB — GLUCOSE, CAPILLARY
Glucose-Capillary: 134 mg/dL — ABNORMAL HIGH (ref 70–99)
Glucose-Capillary: 136 mg/dL — ABNORMAL HIGH (ref 70–99)
Glucose-Capillary: 164 mg/dL — ABNORMAL HIGH (ref 70–99)
Glucose-Capillary: 185 mg/dL — ABNORMAL HIGH (ref 70–99)
Glucose-Capillary: 189 mg/dL — ABNORMAL HIGH (ref 70–99)
Glucose-Capillary: 204 mg/dL — ABNORMAL HIGH (ref 70–99)

## 2023-10-04 LAB — LACTIC ACID, PLASMA: Lactic Acid, Venous: 0.8 mmol/L (ref 0.5–1.9)

## 2023-10-04 LAB — MAGNESIUM: Magnesium: 2.2 mg/dL (ref 1.7–2.4)

## 2023-10-04 LAB — PHOSPHORUS: Phosphorus: 2.4 mg/dL — ABNORMAL LOW (ref 2.5–4.6)

## 2023-10-04 MED ORDER — K PHOS MONO-SOD PHOS DI & MONO 155-852-130 MG PO TABS
500.0000 mg | ORAL_TABLET | Freq: Two times a day (BID) | ORAL | Status: AC
  Administered 2023-10-04 – 2023-10-05 (×2): 500 mg via ORAL
  Filled 2023-10-04 (×2): qty 2

## 2023-10-04 MED ORDER — PANTOPRAZOLE SODIUM 40 MG PO TBEC
40.0000 mg | DELAYED_RELEASE_TABLET | Freq: Once | ORAL | Status: AC
  Administered 2023-10-04: 40 mg via ORAL
  Filled 2023-10-04: qty 1

## 2023-10-04 MED ORDER — LACTATED RINGERS IV SOLN: INTRAVENOUS | Status: AC

## 2023-10-04 MED ORDER — ALUM & MAG HYDROXIDE-SIMETH 200-200-20 MG/5ML PO SUSP
15.0000 mL | ORAL | Status: DC | PRN
  Administered 2023-10-04 – 2023-10-05 (×2): 15 mL via ORAL
  Filled 2023-10-04 (×2): qty 30

## 2023-10-04 MED ORDER — ONDANSETRON HCL 4 MG/2ML IJ SOLN
4.0000 mg | Freq: Four times a day (QID) | INTRAMUSCULAR | Status: DC | PRN
  Administered 2023-10-04 – 2023-10-06 (×3): 4 mg via INTRAVENOUS
  Filled 2023-10-04 (×3): qty 2

## 2023-10-04 MED ORDER — PANTOPRAZOLE SODIUM 40 MG IV SOLR
40.0000 mg | Freq: Once | INTRAVENOUS | Status: DC
  Filled 2023-10-04: qty 10

## 2023-10-04 NOTE — Discharge Instructions (Addendum)
 Please take all prescribed medications exactly as instructed including the remaining course of your oral Vancomycin. Please consume a low sodium diet diet Please increase your physical activity as tolerated. Please maintain all outpatient follow-up appointments including follow-up with your primary care provider and Dr. Pamelia Hoit your oncologist Please return to the emergency department if you develop worsening abdominal pain,diarrhea, fevers in excess of 100.4 F, weakness or inability to tolerate oral intake.   Good Rx coupon options available to lower the cost of oral Vancomycin. If the prescribed quantity is different - your pharmacy may need to re-run a new coupon code for you. Just be sure to ask!

## 2023-10-04 NOTE — Hospital Course (Addendum)
 78 y.o. female with past medical history significant for breast cancer with metastasis to liver, lung, bone with concern for thyroid involvement with nodule, DM2, HTN, gout, history of CVA, anemia of chronic kidney disease, HLD who presented to [facility] ED on 09/27/2023 from home via EMS with reports of increased weakness, letharg and loose stools.  Was recently prescribed antibiotics for UTI.   Upon evaluation in the emergency department patient was found to have multiple SIRS criteria in the ED and with an initial unclear source was placed on broad-spectrum antibiotics and the hospitalist group was called to assess the patient for admission in the hospital.  After a thorough workup including CT imaging revealing no definitive source of infection and negative COVID/influenza/RSV stool studies were sent for ongoing loose stools which were positive for C. difficile.  Patient was initiated on oral vancomycin and enteric precautions.  Hospital course was also complicated by acute kidney injury superimposed on chronic disease stage IIIb with metabolic acidosis for which the patient was managed with intravenous fluids and oral sodium bicarbonate.  Possible course was also complicated by metabolic encephalopathy thought to be secondary to volume depletion and infection which gradually improved throughout the hospitalization.   Patient gradually clinically improved with resolution of her diarrhea.  Associated nausea and vomiting also resolved.  Oral intake gradually improved throughout the hospitalization.  Due to the prolonged hospitalization patient developed substantial weakness.  Physical therapy had recommended a need for skilled PT in a skilled nursing facility however after lengthy discussions with the daughter and patient they have declined placement in skilled nursing facility and wished to go home with resumption of home health services.  Patient is therefore being discharged in improved  and stable additional 10/06/2023.

## 2023-10-04 NOTE — Assessment & Plan Note (Signed)
 Creatinine continues to downtrend Continuing gentle hydration, will transition from saline to lactated Ringer's considering hyperchloremic metabolic acidosis Strict input and output monitoring Monitoring renal function and electrolytes with serial chemistries

## 2023-10-04 NOTE — Assessment & Plan Note (Signed)
?   No clinical evidence of bleeding ?? Monitoring hemoglobin and hematocrit with serial CBCs ?

## 2023-10-04 NOTE — Assessment & Plan Note (Signed)
 Secondary to liver metastasis

## 2023-10-04 NOTE — Assessment & Plan Note (Signed)
 Small volume perihepatic and pelvic ascites as seen on ultrasound of the abdomen 3/1 Supportive care for now.

## 2023-10-04 NOTE — Assessment & Plan Note (Signed)
 Continue oral vancomycin Enteric precautions Substantial improvement in diarrhea although patient is still exhibiting poor oral intake, nausea and vomiting Continuing hydration with intravenous fluids, will transition to lactated Ringer's.

## 2023-10-04 NOTE — TOC Progression Note (Addendum)
 Transition of Care [facility]) - Progression Note    Patient Details  Name: KIMMERLY LORA MRN: [id number] Date of Birth: Oct 22, 1945  Transition of Care [facility]) CM/SW Contact  Adrian Prows, RN Phone Number: 10/04/2023, 4:34 PM  Clinical Narrative:    LVM for pt's [name] at [phone]; awaiting return call to discuss d/c plan.  -1730- return call from pt's dtr [name]; she says she plans for pt to return home; TOC is following.  Expected Discharge Plan: Home w Home Health Services Barriers to Discharge: Continued Medical Work up  Expected Discharge Plan and Services   Discharge Planning Services: CM Consult Post Acute Care Choice: Home Health Living arrangements for the past 2 months: Single Family Home                 DME Arranged: Shower stool DME Agency: [employer] Date DME Agency Contacted: 09/30/23 Time DME Agency Contacted: 1227 Representative spoke with at DME Agency: Vaughan Basta HH Arranged: PT, OT, Nurse's Aide, Social Work, Therapist, music Agency: [facility] Date HH Agency Contacted: 09/30/23   Representative spoke with at [facility] Agency: Haywood Lasso   Social Determinants of Health (SDOH) Interventions SDOH Screenings   Food Insecurity: No Food Insecurity (09/28/2023)  Housing: Low Risk  (09/28/2023)  Transportation Needs: No Transportation Needs (09/28/2023)  Utilities: Not At Risk (09/28/2023)  Alcohol Screen: Low Risk  (05/22/2023)  Depression (PHQ2-9): Low Risk  (08/22/2023)  Financial Resource Strain: Low Risk  (08/22/2023)  Physical Activity: Inactive (08/22/2023)  Social Connections: Unknown (09/28/2023)  Stress: No Stress Concern Present (08/22/2023)  Tobacco Use: Medium Risk (09/27/2023)  Health Literacy: Adequate Health Literacy (05/22/2023)    Readmission Risk Interventions     No data to display

## 2023-10-04 NOTE — Inpatient Diabetes Management (Signed)
 Inpatient Diabetes Program Recommendations  AACE/ADA: New Consensus Statement on Inpatient Glycemic Control (2015)  Target Ranges:  Prepandial:   less than 140 mg/dL      Peak postprandial:   less than 180 mg/dL (1-2 hours)      Critically ill patients:  140 - 180 mg/dL   Lab Results  Component Value Date   GLUCAP 204 (H) 10/04/2023   HGBA1C 7.4 (H) 09/28/2023    Review of Glycemic Control  Latest Reference Range & Units 10/03/23 07:29 10/03/23 11:49 10/03/23 15:52 10/03/23 20:13 10/03/23 23:52 10/04/23 04:11 10/04/23 07:38 10/04/23 11:46  Glucose-Capillary 70 - 99 mg/dL 295 (H) 284 (H) 132 (H) 191 (H) 170 (H) 189 (H) 185 (H) 204 (H)   Diabetes history: DM 2 Outpatient Diabetes medications:  Metformin 500 mg daily Current orders for Inpatient glycemic control:  Novolog 0-9 units q 4 hours Inpatient Diabetes Program Recommendations:   While in the hospital, consider changing Novolog correction to moderate (0-15 units) tid with meals and HS.  Consider also adding Lantus 8 units daily.    Thanks,  Lorenza Cambridge, RN, BC-ADM Inpatient Diabetes Coordinator Pager [phone]  (8a-5p)

## 2023-10-04 NOTE — Assessment & Plan Note (Signed)
 No longer confused but still quite lethargic Continue to treat underlying infection Hydrating with intravenous isotonic fluids Recent vitamin B12/folate unremarkable.  Ammonia unremarkable. CT head unremarkable.

## 2023-10-04 NOTE — Plan of Care (Signed)
  Problem: Coping: °Goal: Level of anxiety will decrease °Outcome: Progressing °  °Problem: Elimination: °Goal: Will not experience complications related to bowel motility °Outcome: Progressing °  °Problem: Safety: °Goal: Ability to remain free from injury will improve °Outcome: Progressing °  °Problem: Skin Integrity: °Goal: Risk for impaired skin integrity will decrease °Outcome: Progressing °  °

## 2023-10-04 NOTE — Assessment & Plan Note (Signed)
 Recent CT imaging revealing progression of disease with liver metastases Dr. [name] with oncology consulted, his input is appreciated Current treatment of [name] and letrozole will be discontinued per oncology Hepatic enzyme derangement likely secondary to liver metastasis

## 2023-10-04 NOTE — Progress Notes (Signed)
 PROGRESS NOTE   Heather Rogers  DGU:[id number] DOB: June 21, 1946 DOA: 09/27/2023 PCP: [name], MD   Date of Service: the patient was seen and examined on 10/04/2023  Brief Narrative:  78 y.o. female with past medical history significant for breast cancer with metastasis to liver, lung, bone with concern for thyroid involvement with nodule, DM2, HTN, gout, history of CVA, anemia of chronic kidney disease, HLD who presented to [facility] ED on 09/27/2023 from home via EMS with reports of increased weakness, letharg and loose stools.  Was recently prescribed antibiotics for UTI.   Upon evaluation in the emergency department patient was found to have multiple SIRS criteria in the ED and with an initial unclear source was placed on broad-spectrum antibiotics and the hospitalist group was called to assess the patient for admission in the hospital.  After a thorough workup including CT imaging revealing no definitive source of infection and negative COVID/influenza/RSV stool studies were sent for ongoing loose stools which were positive for C. difficile.  Patient was initiated on oral vancomycin and enteric precautions.  Hospital course was also complicated by acute kidney injury superimposed on chronic disease stage IIIb with metabolic acidosis for which the patient was managed with intravenous fluids and oral sodium bicarbonate.  Possible course was also complicated by metabolic encephalopathy thought to be secondary to volume depletion and infection which gradually improved throughout the hospitalization.    Assessment & Plan C. difficile colitis Continue oral vancomycin Enteric precautions Substantial improvement in diarrhea although patient is still exhibiting poor oral intake, nausea and vomiting Continuing hydration with intravenous fluids, will transition to lactated Ringer's. Acute renal failure superimposed on stage 3b chronic kidney disease (HCC) Creatinine continues to  downtrend Continuing gentle hydration, will transition from saline to lactated Ringer's considering hyperchloremic metabolic acidosis Strict input and output monitoring Monitoring renal function and electrolytes with serial chemistries Acute metabolic encephalopathy No longer confused but still quite lethargic Continue to treat underlying infection Hydrating with intravenous isotonic fluids Recent vitamin B12/folate unremarkable.  Ammonia unremarkable. CT head unremarkable. Breast cancer metastasized to liver, left [facility]) Recent CT imaging revealing progression of disease with liver metastases Dr. [name] with oncology consulted, his input is appreciated Current treatment of [name] and letrozole will be discontinued per oncology Hepatic enzyme derangement likely secondary to liver metastasis Elevated LFTs Secondary to liver metastasis Malignant ascites Small volume perihepatic and pelvic ascites as seen on ultrasound of the abdomen 3/1 Supportive care for now. Acute on chronic anemia No clinical evidence of bleeding Monitoring hemoglobin and hematocrit with serial CBCs    Subjective:  Patient complaining of intermittent nausea and severe generalized weakness.  Patient's weakness is worse with any attempted exertion and improved with rest.  Patient reports lack of desire to eat any food.  Patient denies any associated abdominal pain.  Physical Exam:  Vitals:   10/03/23 1147 10/03/23 2012 10/04/23 0639 10/04/23 0828  BP: 130/63 (!) 145/60 (!) 146/61   Pulse: 74 75 87   Resp:      Temp:  98.2 F (36.8 C) 99.2 F (37.3 C) 98.1 F (36.7 C)  TempSrc:  Oral Oral Oral  SpO2: 99% 99% 98%   Weight:      Height:        Constitutional: Lethargic but arousable, no associated distress.   Skin: no rashes, no lesions, poor skin turgor noted. Eyes: Pupils are equally reactive to light.  No evidence of scleral icterus or conjunctival pallor.  ENMT: Moist mucous membranes  noted.   Posterior pharynx clear of any exudate or lesions.   Respiratory: clear to auscultation bilaterally, no wheezing, no crackles. Normal respiratory effort. No accessory muscle use.  Cardiovascular: Regular rate and rhythm, no murmurs / rubs / gallops. No extremity edema. 2+ pedal pulses. No carotid bruits.  Abdomen: Abdomen is soft and nontender.  No evidence of intra-abdominal masses.  Positive bowel sounds noted in all quadrants.   Musculoskeletal: No joint deformity upper and lower extremities. Good ROM, no contractures. Normal muscle tone.    Data Reviewed:  I have personally reviewed and interpreted labs, imaging.  Significant findings are   CBC: Recent Labs  Lab 09/27/23 2023 09/28/23 0344 09/30/23 0540 10/01/23 0535 10/02/23 0533 10/03/23 0528 10/04/23 0450  WBC 11.7*   < > 13.6* 17.8* 15.3* 16.6* 14.0*  NEUTROABS 9.0*  --   --   --   --   --   --   HGB 7.6*   < > 7.3* 11.1* 11.2* 12.0 12.8  HCT 22.6*   < > 22.5* 32.5* 32.6* 35.4* 37.4  MCV 78.2*   < > 79.8* 79.9* 81.1 81.2 81.1  PLT 290   < > 269 300 323 342 295   < > = values in this interval not displayed.   Basic Metabolic Panel: Recent Labs  Lab 09/29/23 0516 09/30/23 0540 10/01/23 0535 10/02/23 0533 10/03/23 0528 10/04/23 0450  NA 137 140 138 137 140 143  K 4.8 4.2 4.2 3.8 3.8 4.1  CL 111 114* 111 114* 117* 116*  CO2 16* 16* 16* 14* 14* 16*  GLUCOSE 157* 136* 142* 158* 193* 191*  BUN 57* 72* 78* 69* 56* 45*  CREATININE 2.35* 2.46* 2.07* 1.61* 1.33* 1.06*  CALCIUM 8.4* 8.6* 8.3* 8.1* 8.7* 8.3*  MG 2.3  --   --   --   --   --   PHOS 3.4  --   --   --   --   --    GFR: Estimated Creatinine Clearance: 40.1 mL/min (A) (by C-G formula based on SCr of 1.06 mg/dL (H)). Liver Function Tests: Recent Labs  Lab 09/30/23 0540 10/01/23 0535 10/02/23 0533 10/03/23 0528 10/04/23 0450  AST 161* 85* 58* 79* 78*  ALT 88* 68* 50* 57* 55*  ALKPHOS 130* 141* 133* 165* 149*  BILITOT 0.8 1.1 0.8 0.9 0.9  PROT 6.0*  6.3* 5.7* 6.4* 6.0*  ALBUMIN 2.0* 2.2* 1.9* 2.2* 2.2*    Coagulation Profile: Recent Labs  Lab 09/27/23 2023  INR 1.3*     Code Status:  Full code.  Code status decision has been confirmed with: patient Family Communication: Discussed plan of care with daughter via phone conversation   Severity of Illness:  The appropriate patient status for this patient is INPATIENT. Inpatient status is judged to be reasonable and necessary in order to provide the required intensity of service to ensure the patient's safety. The patient's presenting symptoms, physical exam findings, and initial radiographic and laboratory data in the context of their chronic comorbidities is felt to place them at high risk for further clinical deterioration. Furthermore, it is not anticipated that the patient will be medically stable for discharge from the hospital within 2 midnights of admission.   * I certify that at the point of admission it is my clinical judgment that the patient will require inpatient hospital care spanning beyond 2 midnights from the point of admission due to high intensity of service, high risk for further deterioration and high  frequency of surveillance required.*  Time spent:  50 minutes  Author:  Marinda Elk MD  10/04/2023 8:45 AM

## 2023-10-05 DIAGNOSIS — E1165 Type 2 diabetes mellitus with hyperglycemia: Secondary | ICD-10-CM

## 2023-10-05 DIAGNOSIS — G9341 Metabolic encephalopathy: Secondary | ICD-10-CM | POA: Diagnosis not present

## 2023-10-05 DIAGNOSIS — N179 Acute kidney failure, unspecified: Secondary | ICD-10-CM | POA: Diagnosis not present

## 2023-10-05 DIAGNOSIS — R531 Weakness: Secondary | ICD-10-CM

## 2023-10-05 DIAGNOSIS — A0472 Enterocolitis due to Clostridium difficile, not specified as recurrent: Secondary | ICD-10-CM | POA: Diagnosis not present

## 2023-10-05 DIAGNOSIS — D649 Anemia, unspecified: Secondary | ICD-10-CM | POA: Diagnosis not present

## 2023-10-05 DIAGNOSIS — E440 Moderate protein-calorie malnutrition: Secondary | ICD-10-CM | POA: Insufficient documentation

## 2023-10-05 LAB — CBC WITH DIFFERENTIAL/PLATELET
Abs Immature Granulocytes: 0.31 10*3/uL — ABNORMAL HIGH (ref 0.00–0.07)
Basophils Absolute: 0 10*3/uL (ref 0.0–0.1)
Basophils Relative: 0 %
Eosinophils Absolute: 0.1 10*3/uL (ref 0.0–0.5)
Eosinophils Relative: 1 %
HCT: 29.9 % — ABNORMAL LOW (ref 36.0–46.0)
Hemoglobin: 10 g/dL — ABNORMAL LOW (ref 12.0–15.0)
Immature Granulocytes: 3 %
Lymphocytes Relative: 10 %
Lymphs Abs: 1.2 10*3/uL (ref 0.7–4.0)
MCH: 27.5 pg (ref 26.0–34.0)
MCHC: 33.4 g/dL (ref 30.0–36.0)
MCV: 82.4 fL (ref 80.0–100.0)
Monocytes Absolute: 1.3 10*3/uL — ABNORMAL HIGH (ref 0.1–1.0)
Monocytes Relative: 11 %
Neutro Abs: 8.7 10*3/uL — ABNORMAL HIGH (ref 1.7–7.7)
Neutrophils Relative %: 75 %
Platelets: 299 10*3/uL (ref 150–400)
RBC: 3.63 MIL/uL — ABNORMAL LOW (ref 3.87–5.11)
RDW: 18.1 % — ABNORMAL HIGH (ref 11.5–15.5)
WBC: 11.6 10*3/uL — ABNORMAL HIGH (ref 4.0–10.5)
nRBC: 0 % (ref 0.0–0.2)

## 2023-10-05 LAB — COMPREHENSIVE METABOLIC PANEL
ALT: 54 U/L — ABNORMAL HIGH (ref 0–44)
AST: 75 U/L — ABNORMAL HIGH (ref 15–41)
Albumin: 1.9 g/dL — ABNORMAL LOW (ref 3.5–5.0)
Alkaline Phosphatase: 121 U/L (ref 38–126)
Anion gap: 7 (ref 5–15)
BUN: 37 mg/dL — ABNORMAL HIGH (ref 8–23)
CO2: 17 mmol/L — ABNORMAL LOW (ref 22–32)
Calcium: 8.5 mg/dL — ABNORMAL LOW (ref 8.9–10.3)
Chloride: 118 mmol/L — ABNORMAL HIGH (ref 98–111)
Creatinine, Ser: 1 mg/dL (ref 0.44–1.00)
GFR, Estimated: 58 mL/min — ABNORMAL LOW (ref >60)
Glucose, Bld: 152 mg/dL — ABNORMAL HIGH (ref 70–99)
Potassium: 3.9 mmol/L (ref 3.5–5.1)
Sodium: 142 mmol/L (ref 135–145)
Total Bilirubin: 0.9 mg/dL (ref 0.0–1.2)
Total Protein: 5.4 g/dL — ABNORMAL LOW (ref 6.5–8.1)

## 2023-10-05 LAB — GLUCOSE, CAPILLARY
Glucose-Capillary: 149 mg/dL — ABNORMAL HIGH (ref 70–99)
Glucose-Capillary: 149 mg/dL — ABNORMAL HIGH (ref 70–99)
Glucose-Capillary: 170 mg/dL — ABNORMAL HIGH (ref 70–99)
Glucose-Capillary: 186 mg/dL — ABNORMAL HIGH (ref 70–99)
Glucose-Capillary: 173 mg/dL — ABNORMAL HIGH (ref 70–99)

## 2023-10-05 LAB — PHOSPHORUS: Phosphorus: 3 mg/dL (ref 2.5–4.6)

## 2023-10-05 LAB — MAGNESIUM: Magnesium: 2.2 mg/dL (ref 1.7–2.4)

## 2023-10-05 MED ORDER — INSULIN GLARGINE 100 UNIT/ML ~~LOC~~ SOLN
5.0000 [IU] | Freq: Every day | SUBCUTANEOUS | Status: DC
Start: 2023-10-05 — End: 2023-10-06
  Administered 2023-10-06: 5 [IU] via SUBCUTANEOUS
  Filled 2023-10-05 (×2): qty 0.05

## 2023-10-05 NOTE — Plan of Care (Signed)

## 2023-10-05 NOTE — Assessment & Plan Note (Signed)
 Exhibiting severe generalized weakness after prolonged hospitalization Patient would likely benefit from skilled physical therapy in a skilled facility. Discussed possibility of skilled nursing facility placement with both patient and daughter today.

## 2023-10-05 NOTE — Assessment & Plan Note (Signed)
 Recent CT imaging revealing progression of disease with liver metastases Dr. [name] with oncology consulted, his input is appreciated Current treatment of [name] and letrozole will be discontinued per oncology Hepatic enzyme derangement likely secondary to liver metastasis

## 2023-10-05 NOTE — Progress Notes (Signed)
 Physical Therapy Treatment Patient Details Name: Heather Rogers MRN: [id number] DOB: 18-Jul-1946 Today's Date: 10/05/2023   History of Present Illness Patient is a 78 year old female who presented on 2/28 with increased lethargy and weakness. Patient was admitted with sepsis, acute metabolic encephalopathy, AKI, cdiff. PMH: breast cancer with mets to liver, bone and lung,    PT Comments  Prior to admit, pt was amb with NO AD, Indep with her ADL's and Driving.  Her Heather Rogers Son lives with her. Currently pt requires much assist and presents with weakness/fatigue with a decline in her functional mobility. Pt was AxO x 3 pleasant and willing.  Pt did require increased time to express her thoughts, then she would appologize.  "I'm sorry, I have to think about it". General bed mobility comments: assist to rasie trunk and pivot hips to EOB; Required increased time.  Required an extended rest break. Max c/o feeling "tired". General transfer comment: assisted from bed to [facility].  Required increased time and assist with hygiene as pt was unable to self perform and stand safetly.  Then assisted off BSC onto Rollator to amb with 50% VC's on proper hand placement.  Pt present with posterior lean and slow to rise. General transfer comment: assisted from bed to [facility].  Required increased time and assist with hygiene as pt was unable to self perform and stand safetly.  Then assisted off BSC onto Rollator to amb with 50% VC's on proper hand placement.  Pt present with posterior lean and slow to rise.  With extended hospital length of stay and a decline in her functional mobility, pt may need ST SNF prior to returning home.  Per secure chat with MD, he agrees. Will consult LPT.   If plan is discharge home, recommend the following: A lot of help with walking and/or transfers;A lot of help with bathing/dressing/bathroom;Assistance with cooking/housework;Supervision due to cognitive status;Assist for transportation;Help with  stairs or ramp for entrance;Direct supervision/assist for medications management;Direct supervision/assist for financial management   Can travel by private vehicle     Yes  Equipment Recommendations  Rollator (4 wheels)    Recommendations for Other Services       Precautions / Restrictions Precautions Precautions: Fall Restrictions Weight Bearing Restrictions Per Provider Order: No     Mobility  Bed Mobility Overal bed mobility: Needs Assistance Bed Mobility: Supine to Sit     Supine to sit: Mod assist     General bed mobility comments: assist to rasie trunk and pivot hips to EOB; Required increased time.  Required an extended rest break. Max c/o feeling "tired".    Transfers Overall transfer level: Needs assistance Equipment used: Rollator (4 wheels) Transfers: Sit to/from Stand Sit to Stand: Min assist, Mod assist   Step pivot transfers: Min assist, Mod assist       General transfer comment: assisted from bed to [facility].  Required increased time and assist with hygiene as pt was unable to self perform and stand safetly.  Then assisted off BSC onto Rollator to amb with 50% VC's on proper hand placement.  Pt present with posterior lean and slow to rise.    Ambulation/Gait Ambulation/Gait assistance: Min assist, Mod assist, +2 safety/equipment Gait Distance (Feet): 20 Feet Assistive device: Rollator (4 wheels) Gait Pattern/deviations: Step-to pattern, Step-through pattern, Trunk flexed Gait velocity: decreased     General transfer comment: assisted from bed to [facility].  Required increased time and assist with hygiene as pt was unable to self perform and stand  safetly.  Then assisted off BSC onto Rollator to amb with 50% VC's on proper hand placement.  Pt present with posterior lean and slow to rise.   Stairs             Wheelchair Mobility     Tilt Bed    Modified Rankin (Stroke Patients Only)       Balance                                             Communication    Cognition Arousal: Alert Behavior During Therapy: Flat affect   PT - Cognitive impairments: No apparent impairments                       PT - Cognition Comments: Pt was AxO x 3 pleasant and willing.  Pt did require increased time to express her thoughts, then she would appologize.  "I'm sorry, I have to think about it".   Following commands impaired: Follows one step commands with increased time    Cueing    Exercises      General Comments        Pertinent Vitals/Pain Pain Assessment Pain Assessment: Faces Faces Pain Scale: Hurts a little bit Pain Location: general Pain Descriptors / Indicators: Aching Pain Intervention(s): Monitored during session, Repositioned    Home Living                          Prior Function            PT Goals (current goals can now be found in the care plan section) Progress towards PT goals: Progressing toward goals    Frequency    Min 2X/week      PT Plan      Co-evaluation              AM-PAC PT "6 Clicks" Mobility   Outcome Measure  Help needed turning from your back to your side while in a flat bed without using bedrails?: A Lot Help needed moving from lying on your back to sitting on the side of a flat bed without using bedrails?: A Lot Help needed moving to and from a bed to a chair (including a wheelchair)?: A Lot Help needed standing up from a chair using your arms (e.g., wheelchair or bedside chair)?: A Lot Help needed to walk in hospital room?: A Lot Help needed climbing 3-5 steps with a railing? : A Lot 6 Click Score: 12    End of Session Equipment Utilized During Treatment: Gait belt Activity Tolerance: Patient limited by fatigue Patient left: in chair;with call bell/phone within reach Nurse Communication: Mobility status PT Visit Diagnosis: Difficulty in walking, not elsewhere classified (R26.2)     Time: 5188-4166 PT Time Calculation (min)  (ACUTE ONLY): 24 min  Charges:    $Gait Training: 8-22 mins $Therapeutic Activity: 8-22 mins PT General Charges $$ ACUTE PT VISIT: 1 Visit                    Felecia Shelling  PTA Acute  Rehabilitation Services Office M-F          [phone]

## 2023-10-05 NOTE — Assessment & Plan Note (Signed)
 Downtrending albumin with continued poor appetite.  Concern for progressively worsening protein calorie malnutrition

## 2023-10-05 NOTE — Assessment & Plan Note (Signed)
 Secondary to liver metastasis

## 2023-10-05 NOTE — Assessment & Plan Note (Signed)
 Adding Lantus 5 units nightly Accu-Cheks before every meal and nightly with sliding scale insulin

## 2023-10-05 NOTE — Assessment & Plan Note (Addendum)
 Creatinine has now normalized Associated metabolic acidosis continuing to resolve Strict input and output monitoring Monitoring renal function and electrolytes with serial chemistries

## 2023-10-05 NOTE — Assessment & Plan Note (Signed)
?   No clinical evidence of bleeding ?? Monitoring hemoglobin and hematocrit with serial CBCs ?

## 2023-10-05 NOTE — Assessment & Plan Note (Addendum)
 Continue oral vancomycin, day 7 Enteric precautions Continued improvement in diarrhea Improvement in nausea and vomiting Appetite is still extremely poor.  Transitioning off of intravenous fluids today.

## 2023-10-05 NOTE — Assessment & Plan Note (Signed)
 Small volume perihepatic and pelvic ascites as seen on ultrasound of the abdomen 3/1 Supportive care for now.

## 2023-10-05 NOTE — Progress Notes (Signed)
 PROGRESS NOTE   Heather Rogers  WUJ:[id number] DOB: [birth year] DOA: 09/27/2023 PCP: [name], MD   Date of Service: the patient was seen and examined on 10/05/2023  Brief Narrative:  78 y.o. female with past medical history significant for breast cancer with metastasis to liver, lung, bone with concern for thyroid involvement with nodule, DM2, HTN, gout, history of CVA, anemia of chronic kidney disease, HLD who presented to [facility] ED on 09/27/2023 from home via EMS with reports of increased weakness, letharg and loose stools.  Was recently prescribed antibiotics for UTI.   Upon evaluation in the emergency department patient was found to have multiple SIRS criteria in the ED and with an initial unclear source was placed on broad-spectrum antibiotics and the hospitalist group was called to assess the patient for admission in the hospital.  After a thorough workup including CT imaging revealing no definitive source of infection and negative COVID/influenza/RSV stool studies were sent for ongoing loose stools which were positive for C. difficile.  Patient was initiated on oral vancomycin and enteric precautions.  Hospital course was also complicated by acute kidney injury superimposed on chronic disease stage IIIb with metabolic acidosis for which the patient was managed with intravenous fluids and oral sodium bicarbonate.  Possible course was also complicated by metabolic encephalopathy thought to be secondary to volume depletion and infection which gradually improved throughout the hospitalization.    Assessment & Plan C. difficile colitis Continue oral vancomycin, day 7 Enteric precautions Continued improvement in diarrhea Improvement in nausea and vomiting Appetite is still extremely poor.  Transitioning off of intravenous fluids today. Acute renal failure superimposed on stage 3b chronic kidney disease (HCC) Creatinine has now normalized Associated metabolic  acidosis continuing to resolve Strict input and output monitoring Monitoring renal function and electrolytes with serial chemistries Acute metabolic encephalopathy Question persisting mild confusion Continue to treat underlying infection which was likely contributing Encourage oral hydration recent vitamin B12/folate unremarkable.  Ammonia unremarkable. CT head unremarkable. Generalized weakness Exhibiting severe generalized weakness after prolonged hospitalization Patient would likely benefit from skilled physical therapy in a skilled facility. Discussed possibility of skilled nursing facility placement with both patient and daughter today. Breast cancer metastasized to liver, left [facility]) Recent CT imaging revealing progression of disease with liver metastases Dr. [name] with oncology consulted, his input is appreciated Current treatment of [name] and letrozole will be discontinued per oncology Hepatic enzyme derangement likely secondary to liver metastasis Protein-calorie malnutrition, moderate (HCC) Downtrending albumin with continued poor appetite.  Concern for progressively worsening protein calorie malnutrition  Uncontrolled type 2 diabetes mellitus with hyperglycemia, with long-term current use of insulin (HCC) Adding Lantus 5 units nightly Accu-Cheks before every meal and nightly with sliding scale insulin Elevated LFTs Secondary to liver metastasis Malignant ascites Small volume perihepatic and pelvic ascites as seen on ultrasound of the abdomen 3/1 Supportive care for now. Acute on chronic anemia No clinical evidence of bleeding Monitoring hemoglobin and hematocrit with serial CBCs Hyponatremia (Resolved: 10/05/2023)    Subjective:  Patient states her nausea has improved.  Associated diarrhea has also improved.  Patient still complaining of generalized weakness.  Patient also reports continued lack of appetite.  Physical Exam:  Vitals:   10/04/23 0828 10/04/23 1150  10/04/23 2013 10/05/23 0421  BP:  (!) 123/51 (!) 132/55 (!) 149/66  Pulse:  76 76 85  Resp:  19 20 20   Temp: 98.1 F (36.7 C) 97.9 F (36.6 C) 98.2 F (36.8 C) 98.7 F (37.1  C)  TempSrc: Oral  Oral   SpO2:  98% 98% 100%  Weight:      Height:        Constitutional: Lethargic but arousable, no associated distress.   Skin: no rashes, no lesions, poor skin turgor noted. Eyes: Pupils are equally reactive to light.  No evidence of scleral icterus or conjunctival pallor.  ENMT: Moist mucous membranes noted.  Posterior pharynx clear of any exudate or lesions.   Respiratory: clear to auscultation bilaterally, no wheezing, no crackles. Normal respiratory effort. No accessory muscle use.  Cardiovascular: Regular rate and rhythm, no murmurs / rubs / gallops. No extremity edema. 2+ pedal pulses. No carotid bruits.  Abdomen: Abdomen is soft and nontender.  No evidence of intra-abdominal masses.  Positive bowel sounds noted in all quadrants.   Musculoskeletal: No joint deformity upper and lower extremities. Good ROM, no contractures. Normal muscle tone.    Data Reviewed:  I have personally reviewed and interpreted labs, imaging.  Significant findings are   CBC: Recent Labs  Lab 10/01/23 0535 10/02/23 0533 10/03/23 0528 10/04/23 0450 10/05/23 0548  WBC 17.8* 15.3* 16.6* 14.0* 11.6*  NEUTROABS  --   --   --   --  8.7*  HGB 11.1* 11.2* 12.0 12.8 10.0*  HCT 32.5* 32.6* 35.4* 37.4 29.9*  MCV 79.9* 81.1 81.2 81.1 82.4  PLT 300 323 342 295 299   Basic Metabolic Panel: Recent Labs  Lab 09/29/23 0516 09/30/23 0540 10/01/23 0535 10/02/23 0533 10/03/23 0528 10/04/23 0450 10/04/23 2208 10/05/23 0548  NA 137   < > 138 137 140 143  --  142  K 4.8   < > 4.2 3.8 3.8 4.1  --  3.9  CL 111   < > 111 114* 117* 116*  --  118*  CO2 16*   < > 16* 14* 14* 16*  --  17*  GLUCOSE 157*   < > 142* 158* 193* 191*  --  152*  BUN 57*   < > 78* 69* 56* 45*  --  37*  CREATININE 2.35*   < > 2.07* 1.61*  1.33* 1.06*  --  1.00  CALCIUM 8.4*   < > 8.3* 8.1* 8.7* 8.3*  --  8.5*  MG 2.3  --   --   --   --   --  2.2 2.2  PHOS 3.4  --   --   --   --   --  2.4* 3.0   < > = values in this interval not displayed.   GFR: Estimated Creatinine Clearance: 42.5 mL/min (by C-G formula based on SCr of 1 mg/dL). Liver Function Tests: Recent Labs  Lab 10/01/23 0535 10/02/23 0533 10/03/23 0528 10/04/23 0450 10/05/23 0548  AST 85* 58* 79* 78* 75*  ALT 68* 50* 57* 55* 54*  ALKPHOS 141* 133* 165* 149* 121  BILITOT 1.1 0.8 0.9 0.9 0.9  PROT 6.3* 5.7* 6.4* 6.0* 5.4*  ALBUMIN 2.2* 1.9* 2.2* 2.2* 1.9*    Coagulation Profile: No results for input(s): "INR", "PROTIME" in the last 168 hours.    Code Status:  Full code.  Code status decision has been confirmed with: patient Family Communication: Discussed plan of care with daughter via phone conversation   Severity of Illness:  The appropriate patient status for this patient is INPATIENT. Inpatient status is judged to be reasonable and necessary in order to provide the required intensity of service to ensure the patient's safety. The patient's presenting symptoms, physical exam  findings, and initial radiographic and laboratory data in the context of their chronic comorbidities is felt to place them at high risk for further clinical deterioration. Furthermore, it is not anticipated that the patient will be medically stable for discharge from the hospital within 2 midnights of admission.   * I certify that at the point of admission it is my clinical judgment that the patient will require inpatient hospital care spanning beyond 2 midnights from the point of admission due to high intensity of service, high risk for further deterioration and high frequency of surveillance required.*  Time spent:  50 minutes  Author:  Marinda Elk MD  10/05/2023 9:32 AM

## 2023-10-05 NOTE — Assessment & Plan Note (Addendum)
 Question persisting mild confusion Continue to treat underlying infection which was likely contributing Encourage oral hydration recent vitamin B12/folate unremarkable.  Ammonia unremarkable. CT head unremarkable.

## 2023-10-06 DIAGNOSIS — N179 Acute kidney failure, unspecified: Secondary | ICD-10-CM | POA: Diagnosis not present

## 2023-10-06 DIAGNOSIS — D649 Anemia, unspecified: Secondary | ICD-10-CM | POA: Diagnosis not present

## 2023-10-06 DIAGNOSIS — Z794 Long term (current) use of insulin: Secondary | ICD-10-CM

## 2023-10-06 DIAGNOSIS — E871 Hypo-osmolality and hyponatremia: Secondary | ICD-10-CM

## 2023-10-06 DIAGNOSIS — G9341 Metabolic encephalopathy: Secondary | ICD-10-CM | POA: Diagnosis not present

## 2023-10-06 DIAGNOSIS — E1165 Type 2 diabetes mellitus with hyperglycemia: Secondary | ICD-10-CM

## 2023-10-06 DIAGNOSIS — E440 Moderate protein-calorie malnutrition: Secondary | ICD-10-CM

## 2023-10-06 DIAGNOSIS — A0472 Enterocolitis due to Clostridium difficile, not specified as recurrent: Secondary | ICD-10-CM | POA: Diagnosis not present

## 2023-10-06 LAB — CBC WITH DIFFERENTIAL/PLATELET
Abs Immature Granulocytes: 0.2 10*3/uL — ABNORMAL HIGH (ref 0.00–0.07)
Basophils Absolute: 0 10*3/uL (ref 0.0–0.1)
Basophils Relative: 0 %
Eosinophils Absolute: 0.1 10*3/uL (ref 0.0–0.5)
Eosinophils Relative: 1 %
HCT: 29.9 % — ABNORMAL LOW (ref 36.0–46.0)
Hemoglobin: 10 g/dL — ABNORMAL LOW (ref 12.0–15.0)
Immature Granulocytes: 2 %
Lymphocytes Relative: 11 %
Lymphs Abs: 1.1 10*3/uL (ref 0.7–4.0)
MCH: 27.7 pg (ref 26.0–34.0)
MCHC: 33.4 g/dL (ref 30.0–36.0)
MCV: 82.8 fL (ref 80.0–100.0)
Monocytes Absolute: 1 10*3/uL (ref 0.1–1.0)
Monocytes Relative: 10 %
Neutro Abs: 7.5 10*3/uL (ref 1.7–7.7)
Neutrophils Relative %: 76 %
Platelets: 327 10*3/uL (ref 150–400)
RBC: 3.61 MIL/uL — ABNORMAL LOW (ref 3.87–5.11)
RDW: 17.8 % — ABNORMAL HIGH (ref 11.5–15.5)
WBC: 10 10*3/uL (ref 4.0–10.5)
nRBC: 0 % (ref 0.0–0.2)

## 2023-10-06 LAB — COMPREHENSIVE METABOLIC PANEL
ALT: 68 U/L — ABNORMAL HIGH (ref 0–44)
AST: 98 U/L — ABNORMAL HIGH (ref 15–41)
Albumin: 2.1 g/dL — ABNORMAL LOW (ref 3.5–5.0)
Alkaline Phosphatase: 127 U/L — ABNORMAL HIGH (ref 38–126)
Anion gap: 9 (ref 5–15)
BUN: 24 mg/dL — ABNORMAL HIGH (ref 8–23)
CO2: 19 mmol/L — ABNORMAL LOW (ref 22–32)
Calcium: 8.5 mg/dL — ABNORMAL LOW (ref 8.9–10.3)
Chloride: 115 mmol/L — ABNORMAL HIGH (ref 98–111)
Creatinine, Ser: 1.02 mg/dL — ABNORMAL HIGH (ref 0.44–1.00)
GFR, Estimated: 57 mL/min — ABNORMAL LOW (ref >60)
Glucose, Bld: 137 mg/dL — ABNORMAL HIGH (ref 70–99)
Potassium: 3.6 mmol/L (ref 3.5–5.1)
Sodium: 143 mmol/L (ref 135–145)
Total Bilirubin: 0.6 mg/dL (ref 0.0–1.2)
Total Protein: 5.8 g/dL — ABNORMAL LOW (ref 6.5–8.1)

## 2023-10-06 LAB — GLUCOSE, CAPILLARY
Glucose-Capillary: 123 mg/dL — ABNORMAL HIGH (ref 70–99)
Glucose-Capillary: 126 mg/dL — ABNORMAL HIGH (ref 70–99)
Glucose-Capillary: 133 mg/dL — ABNORMAL HIGH (ref 70–99)
Glucose-Capillary: 135 mg/dL — ABNORMAL HIGH (ref 70–99)

## 2023-10-06 LAB — PHOSPHORUS: Phosphorus: 3.2 mg/dL (ref 2.5–4.6)

## 2023-10-06 LAB — MAGNESIUM: Magnesium: 1.9 mg/dL (ref 1.7–2.4)

## 2023-10-06 MED ORDER — VANCOMYCIN HCL 125 MG PO CAPS: 125.0000 mg | ORAL_CAPSULE | Freq: Four times a day (QID) | ORAL | 0 refills | Status: AC

## 2023-10-06 MED ORDER — ONDANSETRON HCL 8 MG PO TABS: 8.0000 mg | ORAL_TABLET | Freq: Three times a day (TID) | ORAL | 0 refills | Status: DC | PRN

## 2023-10-06 NOTE — Assessment & Plan Note (Deleted)
 Continue oral vancomycin, day 7 Enteric precautions Continued improvement in diarrhea Improvement in nausea and vomiting Appetite is still extremely poor.  Transitioning off of intravenous fluids today.

## 2023-10-06 NOTE — Assessment & Plan Note (Deleted)
 Recent CT imaging revealing progression of disease with liver metastases Dr. [name] with oncology consulted, his input is appreciated Current treatment of [name] and letrozole will be discontinued per oncology Hepatic enzyme derangement likely secondary to liver metastasis

## 2023-10-06 NOTE — Assessment & Plan Note (Deleted)
 Adding Lantus 5 units nightly Accu-Cheks before every meal and nightly with sliding scale insulin

## 2023-10-06 NOTE — Assessment & Plan Note (Deleted)
 Secondary to liver metastasis

## 2023-10-06 NOTE — Assessment & Plan Note (Deleted)
 Question persisting mild confusion Continue to treat underlying infection which was likely contributing Encourage oral hydration recent vitamin B12/folate unremarkable.  Ammonia unremarkable. CT head unremarkable.

## 2023-10-06 NOTE — TOC Transition Note (Addendum)
 Transition of Care [facility]) - Discharge Note   Patient Details  Name: Heather Rogers MRN: [id number] Date of Birth: [birth year]  Transition of Care [facility]) CM/SW Contact:  Adrian Prows, RN Phone Number: 10/06/2023, 5:00 PM   Clinical Narrative:    D/C orders received; pt is active w/ [facility]; spoke w/ Tia Masker at agency; she says agency will contact pt/dtr in 24-48 hours to schedule resumption of care; pt and dtr [name] notified at bedside; pt's dtr agrees to d/c plan; agency contact info placed in follow up provider section of d/c instructions; no TOC needs.   Final next level of care: Home w Home Health Services Barriers to Discharge: No Barriers Identified   Patient Goals and CMS Choice Patient states their goals for this hospitalization and ongoing recovery are:: Home CMS Medicare.gov Compare Post Acute Care list provided to:: Patient Represenative (must comment) Choice offered to / list presented to : [place] ownership interest in [facility].provided to:: Adult Children    Discharge Placement                       Discharge Plan and Services Additional resources added to the After Visit Summary for     Discharge Planning Services: CM Consult Post Acute Care Choice: Home Health          DME Arranged: Shower stool DME Agency: [employer] Date DME Agency Contacted: 09/30/23 Time DME Agency Contacted: 1227 Representative spoke with at DME Agency: Vaughan Basta HH Arranged: PT, OT, Nurse's Aide, Social Work, Therapist, music Agency: [facility] Date HH Agency Contacted: 09/30/23   Representative spoke with at [facility] Agency: Haywood Lasso  Social Drivers of Health (SDOH) Interventions SDOH Screenings   Food Insecurity: No Food Insecurity (09/28/2023)  Housing: Low Risk  (09/28/2023)  Transportation Needs: No Transportation Needs (09/28/2023)  Utilities: Not At Risk (09/28/2023)  Alcohol Screen: Low Risk  (05/22/2023)  Depression (PHQ2-9): Low  Risk  (08/22/2023)  Financial Resource Strain: Low Risk  (08/22/2023)  Physical Activity: Inactive (08/22/2023)  Social Connections: Unknown (09/28/2023)  Stress: No Stress Concern Present (08/22/2023)  Tobacco Use: Medium Risk (09/27/2023)  Health Literacy: Adequate Health Literacy (05/22/2023)     Readmission Risk Interventions     No data to display

## 2023-10-06 NOTE — Progress Notes (Signed)
Patient discharged: Home with family  Via: Wheelchair   Discharge paperwork given: to patient and family  Reviewed with teach back  IV removed  Belongings given to patient

## 2023-10-06 NOTE — Plan of Care (Signed)

## 2023-10-06 NOTE — Assessment & Plan Note (Deleted)
 Exhibiting severe generalized weakness after prolonged hospitalization Patient would likely benefit from skilled physical therapy in a skilled facility. Discussed possibility of skilled nursing facility placement with both patient and daughter today.

## 2023-10-06 NOTE — Assessment & Plan Note (Deleted)
?   No clinical evidence of bleeding ?? Monitoring hemoglobin and hematocrit with serial CBCs ?

## 2023-10-06 NOTE — Assessment & Plan Note (Deleted)
 Creatinine has now normalized Associated metabolic acidosis continuing to resolve Strict input and output monitoring Monitoring renal function and electrolytes with serial chemistries

## 2023-10-06 NOTE — Assessment & Plan Note (Deleted)
 Small volume perihepatic and pelvic ascites as seen on ultrasound of the abdomen 3/1 Supportive care for now.

## 2023-10-06 NOTE — Assessment & Plan Note (Deleted)
 Downtrending albumin with continued poor appetite.  Concern for progressively worsening protein calorie malnutrition

## 2023-10-07 ENCOUNTER — Telehealth: Payer: Self-pay

## 2023-10-07 NOTE — Transitions of Care (Post Inpatient/ED Visit) (Signed)
   10/07/2023  Name: Heather Rogers MRN: [id number] DOB: [birth year]  Today's TOC FU Call Status: Today's TOC FU Call Status:: Unsuccessful Call (1st Attempt) Unsuccessful Call (1st Attempt) Date: 10/07/23  Attempted to reach the patient regarding the most recent Inpatient/ED visit.  Follow Up Plan: Additional outreach attempts will be made to reach the patient to complete the Transitions of Care (Post Inpatient/ED visit) call.   Lynzie Cliburn A. Mliss Fritz RN, BA, [facility], CRRN [facility] RN Care Manager, Transition of Care [phone]

## 2023-10-08 ENCOUNTER — Telehealth: Payer: Self-pay

## 2023-10-08 DIAGNOSIS — E1122 Type 2 diabetes mellitus with diabetic chronic kidney disease: Secondary | ICD-10-CM | POA: Diagnosis not present

## 2023-10-08 DIAGNOSIS — E1159 Type 2 diabetes mellitus with other circulatory complications: Secondary | ICD-10-CM | POA: Diagnosis not present

## 2023-10-08 DIAGNOSIS — D631 Anemia in chronic kidney disease: Secondary | ICD-10-CM | POA: Diagnosis not present

## 2023-10-08 DIAGNOSIS — C787 Secondary malignant neoplasm of liver and intrahepatic bile duct: Secondary | ICD-10-CM | POA: Diagnosis not present

## 2023-10-08 DIAGNOSIS — C50912 Malignant neoplasm of unspecified site of left female breast: Secondary | ICD-10-CM | POA: Diagnosis not present

## 2023-10-08 DIAGNOSIS — D630 Anemia in neoplastic disease: Secondary | ICD-10-CM | POA: Diagnosis not present

## 2023-10-08 DIAGNOSIS — I152 Hypertension secondary to endocrine disorders: Secondary | ICD-10-CM | POA: Diagnosis not present

## 2023-10-08 DIAGNOSIS — N1832 Chronic kidney disease, stage 3b: Secondary | ICD-10-CM | POA: Diagnosis not present

## 2023-10-08 DIAGNOSIS — C7800 Secondary malignant neoplasm of unspecified lung: Secondary | ICD-10-CM | POA: Diagnosis not present

## 2023-10-08 NOTE — Transitions of Care (Post Inpatient/ED Visit) (Signed)
 10/08/2023  Name: Heather Rogers MRN: [id number] DOB: February 03, 1946  Today's TOC FU Call Status: Today's TOC FU Call Status:: Successful TOC FU Call Completed TOC FU Call Complete Date: 10/08/23 Patient's Name and Date of Birth confirmed.  Transition Care Management Follow-up Telephone Call Date of Discharge: 10/06/23 Discharge Facility: [facility]) Type of Discharge: Inpatient Admission Primary Inpatient Discharge Diagnosis:: Sepsis, C. difficile colitis How have you been since you were released from the hospital?: Better Any questions or concerns?: No  Items Reviewed: Did you receive and understand the discharge instructions provided?: Yes Medications obtained,verified, and reconciled?: Yes (Medications Reviewed) Any new allergies since your discharge?: No Dietary orders reviewed?: No Do you have support at home?: Yes People in Home: child(ren), adult, grandchild(ren) Name of Support/Comfort Primary Source: daughter [name] visits patient every day and manages her medical appointments, and grandson ([name]'s son) lives with patient and provides support as needed  Medications Reviewed Today: Medications Reviewed Today     Reviewed by Marcos Eke, RN (Registered Nurse) on 10/08/23 at 1138  Med List Status: <None>   Medication Order Taking? Sig Documenting Provider Last Dose Status Informant  Accu-Chek FastClix Lancets MISC [id number] No USE TO CHECK FASTING BLOOD SUGAR AND BEFORE MEALS AND AGAIN IF PATIENT FEELS BAD (SYMPTOMS OF HYPO) AS DIRECTED BY YOUR PRESCRIBER [name], MD Taking Active Family Member  allopurinol (ZYLOPRIM) 100 MG tablet [id number] No Take 1 tablet (100 mg total) by mouth daily. [name], MD 09/27/2023 Morning Active Family Member  amLODipine (NORVASC) 10 MG tablet [id number] No Take 1 tablet (10 mg total) by mouth daily. [name], MD 09/27/2023 Morning Active Family Member  aspirin EC 81 MG tablet [id number] No Take 81 mg by mouth in  the morning. Swallow whole. [provider] 09/27/2023 Morning Active Family Member  atorvastatin (LIPITOR) 80 MG tablet [id number] No TAKE 1 TABLET EVERY Patrici Ranks, MD 09/27/2023 Evening Active Family Member  Blood Glucose Monitoring Suppl (ACCU-CHEK GUIDE) w/Device KIT [id number] No 1 each by Does not apply route as directed. Fulp, Cammie, MD Taking Active Family Member  carvedilol (COREG) 25 MG tablet [id number] No Take 1 tablet (25 mg total) by mouth 2 (two) times daily with a meal. [name], MD 09/27/2023 Evening Active Family Member  cholecalciferol (VITAMIN D3) 25 MCG (1000 UNIT) tablet [id number] No Take 1,000 Units by mouth daily. [provider] 09/27/2023 Active Family Member  ferrous sulfate 325 (65 FE) MG EC tablet [id number] No Take 1 tablet (325 mg total) by mouth in the morning and at bedtime. Mayers, Kasandra Knudsen, PA-C 09/27/2023 Evening Active Family Member  gabapentin (NEURONTIN) 100 MG capsule [id number] No Take 1 capsule (100 mg total) by mouth 3 (three) times daily. [name], MD Taking Active Family Member  glucose blood (ACCU-CHEK GUIDE) test strip [id number] No USE TO CHECK FASTING BLOOD SUGAR AND BEFORE MEALS AND AGAIN IF PATIENT FEELS BAD; SYMPTOMS OF HYPOGLYCEMIA [name], MD Taking Active Family Member  letrozole [facility]) 2.5 MG tablet [id number] No TAKE 1 TABLET BY MOUTH DAILY Serena Croissant, MD 09/27/2023 Active Family Member  metFORMIN (GLUCOPHAGE) 500 MG tablet [id number] No Take 1 tablet (500 mg total) by mouth daily with breakfast. [name], MD 09/27/2023 Active Family Member           Med Note Cyndie Chime, [facility] I   Fri Sep 27, 2023 10:48 PM) Pt took 2 doses today.  Multiple Vitamin (MULTIVITAMIN WITH MINERALS) TABS tablet [id number] No Take 1 tablet by mouth  daily. Maretta Bees, MD 09/27/2023 Active Family Member  ondansetron (ZOFRAN) 8 MG tablet [id number]  Take 1 tablet (8 mg total) by mouth every 8 (eight) hours as needed.  Shalhoub, Deno Lunger, MD  Active   polyethylene glycol (MIRALAX / GLYCOLAX) 17 g packet [id number] No Take 17 g by mouth daily as needed (constipation.). Serena Croissant, MD 09/27/2023 Active Family Member  vancomycin (VANCOCIN) 125 MG capsule [id number]  Take 1 capsule (125 mg total) by mouth 4 (four) times daily for 3 days. Marinda Elk, MD  Active   [name] 50 MG tablet [id number] No TAKE 1 TABLET BY MOUTH TWO TIMES DAILY. SWALLOW WHOLE TABLETS; DO NOT CRUSH, CHEW, OR SPLIT TABLETS BEFORE SWALLOWING. Serena Croissant, MD Past Week Active Family Member           Med Note Cyndie Chime, [facility] I   Fri Sep 27, 2023 10:49 PM) On Hold.  Med List Note Anselm Lis, RPH-CPP 02/13/22 2440): [name] filled at [facility] known as [facility])            Home Care and Equipment/Supplies: Any new equipment or medical supplies ordered?: No (Pt's daughter is going to req order for wheelchair for patient mobility & when she calls Dr. Baxter Flattery office to make a hospital FU appt sooner than scheduled 4/30 appt,daughter will alert appt desk staff of need for wheelchair & need for 7-14 d follow up)  Functional Questionnaire: Do you need assistance with bathing/showering or dressing?: Yes (patient's daughter helps with personal activities of daily living as needed) Do you need assistance with meal preparation?: Yes (patient's family (daughter, grandson) assist with meal prep) Do you need assistance with eating?: No Do you have difficulty maintaining continence: No Do you need assistance with getting out of bed/getting out of a chair/moving?: Yes (Daughter states that she is requesting order for wheelchair for patient due to decreasing mobility) Do you have difficulty managing or taking your medications?: Yes  Follow up appointments reviewed: PCP Follow-up appointment confirmed?: Yes (Pt's daughter requesting MD order for wheelchair for patient mobility & is going to call Dr. Baxter Flattery  office to make a hospital FU appt sooner than scheduled 4/30 appt,daughter will alert appt desk staff of need for wheelchair & need for 7-14 d follow up) Date of PCP follow-up appointment?: 11/27/23 Follow-up Provider: Dr. Gilman [facility] Follow-up appointment confirmed?: Yes Date of Specialist follow-up appointment?: 10/28/23 Follow-Up Specialty Provider:: Dr. Trudee Kuster, Hematology & Oncology Do you need transportation to your follow-up appointment?: No Do you understand care options if your condition(s) worsen?: Yes-patient verbalized understanding   Kannon Granderson A. Mliss Fritz RN, BA, [facility], CRRN [facility] RN Care Manager, Transition of Care [phone]

## 2023-10-14 ENCOUNTER — Ambulatory Visit: Payer: Self-pay | Admitting: Family Medicine

## 2023-10-14 DIAGNOSIS — N1832 Chronic kidney disease, stage 3b: Secondary | ICD-10-CM | POA: Diagnosis not present

## 2023-10-14 DIAGNOSIS — E1159 Type 2 diabetes mellitus with other circulatory complications: Secondary | ICD-10-CM | POA: Diagnosis not present

## 2023-10-14 DIAGNOSIS — E1122 Type 2 diabetes mellitus with diabetic chronic kidney disease: Secondary | ICD-10-CM | POA: Diagnosis not present

## 2023-10-14 DIAGNOSIS — D631 Anemia in chronic kidney disease: Secondary | ICD-10-CM | POA: Diagnosis not present

## 2023-10-14 DIAGNOSIS — D630 Anemia in neoplastic disease: Secondary | ICD-10-CM | POA: Diagnosis not present

## 2023-10-14 DIAGNOSIS — I152 Hypertension secondary to endocrine disorders: Secondary | ICD-10-CM | POA: Diagnosis not present

## 2023-10-14 DIAGNOSIS — C50912 Malignant neoplasm of unspecified site of left female breast: Secondary | ICD-10-CM | POA: Diagnosis not present

## 2023-10-14 DIAGNOSIS — C7800 Secondary malignant neoplasm of unspecified lung: Secondary | ICD-10-CM | POA: Diagnosis not present

## 2023-10-14 DIAGNOSIS — C787 Secondary malignant neoplasm of liver and intrahepatic bile duct: Secondary | ICD-10-CM | POA: Diagnosis not present

## 2023-10-14 NOTE — Telephone Encounter (Signed)
 Routing to PCP

## 2023-10-14 NOTE — Telephone Encounter (Signed)
 Chief Complaint: Shortness of breath Symptoms: shortness of breath, low pulse ox readings Frequency: last few days patient has felt more short of breath with movement Pertinent Negatives: Patient denies fever Disposition: [x] ED /[] Urgent Care (no appt availability in office) / [] Appointment(In office/virtual)/ []  [place] [facility]/ [] Home Care/ [] Refused Recommended Disposition /[] [place] [facility]/ []  Follow-up with PCP Additional Notes: Melissa from [facility] home health calling during home health visit. Melissa states that pulse ox reading prior to walking was 91%. Pulse Ox dropped to 82% while walking. Patient was visibly short of breath according to report from [place]. Patient is on room air. Melissa reports it look several minutes for patient pulse ox to increase to 89%-90%. Patient was laying on her bed while Efraim Kaufmann is speaking with nurse triage. Pulse ox remains at 89-90%. Patient reports being more short of breath with movement and has history of cardiomyopathy. Per protocol, patient is recommended to the emergency department. Melissa verbalized understanding and said she would instruct patient and family.    Copied from CRM [id number]. Topic: Clinical - Red Word Triage >> Oct 14, 2023  4:18 PM Carla L wrote: Red Word that prompted transfer to Nurse Triage: Oxygen was a 91 and dropped down to 82, mildly SOB. Reason for Disposition  Oxygen level (e.g., pulse oximetry) 90 percent or lower  Answer Assessment - Initial Assessment Questions 1. RESPIRATORY STATUS: "Describe your breathing?" (e.g., wheezing, shortness of breath, unable to speak, severe coughing)      Shortness of breath 2. ONSET: "When did this breathing problem begin?"      More short of breath with movement 3. PATTERN "Does the difficult breathing come and go, or has it been constant since it started?"      constatnt 4. SEVERITY: "How bad is your breathing?" (e.g., mild, moderate, severe)    - MILD: No SOB at  rest, mild SOB with walking, speaks normally in sentences, can lie down, no retractions, pulse < 100.    - MODERATE: SOB at rest, SOB with minimal exertion and prefers to sit, cannot lie down flat, speaks in phrases, mild retractions, audible wheezing, pulse 100-120.    - SEVERE: Very SOB at rest, speaks in single words, struggling to breathe, sitting hunched forward, retractions, pulse > 120      Mild 5. RECURRENT SYMPTOM: "Have you had difficulty breathing before?" If Yes, ask: "When was the last time?" and "What happened that time?"      unsure 6. CARDIAC HISTORY: "Do you have any history of heart disease?" (e.g., heart attack, angina, bypass surgery, angioplasty)      cardiomyopathy 7. LUNG HISTORY: "Do you have any history of lung disease?"  (e.g., pulmonary embolus, asthma, emphysema)     Lung cancer 8. CAUSE: "What do you think is causing the breathing problem?"      unsure 9. OTHER SYMPTOMS: "Do you have any other symptoms? (e.g., dizziness, runny nose, cough, chest pain, fever)     No 10. O2 SATURATION MONITOR:  "Do you use an oxygen saturation monitor (pulse oximeter) at home?" If Yes, ask: "What is your reading (oxygen level) today?" "What is your usual oxygen saturation reading?" (e.g., 95%)       Initial pulse O2 was 91% RA-walked down the hall with home health and pulse O2 went to 82%-RN states it has taken her quite awhile to get her oxygen level to 86%. Currently resting 89-90% 12. TRAVEL: "Have you traveled out of the country in the last month?" (e.g.,  travel history, exposures)       No  Protocols used: Breathing Difficulty-A-AH

## 2023-10-17 DIAGNOSIS — C50912 Malignant neoplasm of unspecified site of left female breast: Secondary | ICD-10-CM | POA: Diagnosis not present

## 2023-10-17 DIAGNOSIS — D630 Anemia in neoplastic disease: Secondary | ICD-10-CM | POA: Diagnosis not present

## 2023-10-17 DIAGNOSIS — C7800 Secondary malignant neoplasm of unspecified lung: Secondary | ICD-10-CM | POA: Diagnosis not present

## 2023-10-17 DIAGNOSIS — N1832 Chronic kidney disease, stage 3b: Secondary | ICD-10-CM | POA: Diagnosis not present

## 2023-10-17 DIAGNOSIS — C787 Secondary malignant neoplasm of liver and intrahepatic bile duct: Secondary | ICD-10-CM | POA: Diagnosis not present

## 2023-10-17 DIAGNOSIS — D631 Anemia in chronic kidney disease: Secondary | ICD-10-CM | POA: Diagnosis not present

## 2023-10-17 DIAGNOSIS — I152 Hypertension secondary to endocrine disorders: Secondary | ICD-10-CM | POA: Diagnosis not present

## 2023-10-17 DIAGNOSIS — E1122 Type 2 diabetes mellitus with diabetic chronic kidney disease: Secondary | ICD-10-CM | POA: Diagnosis not present

## 2023-10-17 DIAGNOSIS — E1159 Type 2 diabetes mellitus with other circulatory complications: Secondary | ICD-10-CM | POA: Diagnosis not present

## 2023-10-22 ENCOUNTER — Telehealth: Payer: Self-pay | Admitting: Family Medicine

## 2023-10-22 DIAGNOSIS — D630 Anemia in neoplastic disease: Secondary | ICD-10-CM | POA: Diagnosis not present

## 2023-10-22 DIAGNOSIS — C7800 Secondary malignant neoplasm of unspecified lung: Secondary | ICD-10-CM | POA: Diagnosis not present

## 2023-10-22 DIAGNOSIS — G9341 Metabolic encephalopathy: Secondary | ICD-10-CM | POA: Diagnosis not present

## 2023-10-22 DIAGNOSIS — C787 Secondary malignant neoplasm of liver and intrahepatic bile duct: Secondary | ICD-10-CM | POA: Diagnosis not present

## 2023-10-22 DIAGNOSIS — C50912 Malignant neoplasm of unspecified site of left female breast: Secondary | ICD-10-CM | POA: Diagnosis not present

## 2023-10-22 DIAGNOSIS — N179 Acute kidney failure, unspecified: Secondary | ICD-10-CM | POA: Diagnosis not present

## 2023-10-22 DIAGNOSIS — E1122 Type 2 diabetes mellitus with diabetic chronic kidney disease: Secondary | ICD-10-CM | POA: Diagnosis not present

## 2023-10-22 DIAGNOSIS — I152 Hypertension secondary to endocrine disorders: Secondary | ICD-10-CM | POA: Diagnosis not present

## 2023-10-22 DIAGNOSIS — E1159 Type 2 diabetes mellitus with other circulatory complications: Secondary | ICD-10-CM | POA: Diagnosis not present

## 2023-10-22 NOTE — Telephone Encounter (Signed)
error 

## 2023-10-22 NOTE — Discharge Summary (Signed)
 Physician Discharge Summary   Patient: Heather Rogers MRN: [id number] DOB: 1946-02-26  Admit date:     09/27/2023  Discharge date: 10/06/2023  Discharge Physician: Marinda Elk   PCP: [name], MD   Recommendations at discharge:   Please take all prescribed medications exactly as instructed including the remaining course of your oral Vancomycin. Please consume a low sodium diet diet Please increase your physical activity as tolerated. Please maintain all outpatient follow-up appointments including follow-up with your primary care provider and Dr. Pamelia Hoit your oncologist Please return to the emergency department if you develop worsening abdominal pain,diarrhea, fevers in excess of 100.4 F, weakness or inability to tolerate oral intake.  Discharge Diagnoses: Principal Problem:   C. difficile colitis Active Problems:   Acute renal failure superimposed on stage 3b chronic kidney disease (HCC)   Acute metabolic encephalopathy   Elevated LFTs   Malignant ascites   Acute on chronic anemia   Breast cancer metastasized to liver, left (HCC)   Generalized weakness   Protein-calorie malnutrition, moderate (HCC)   Uncontrolled type 2 diabetes mellitus with hyperglycemia, with long-term current use of insulin (HCC)  Resolved Problems:   Hyponatremia   Hospital Course: 78 y.o. female with past medical history significant for breast cancer with metastasis to liver, lung, bone with concern for thyroid involvement with nodule, DM2, HTN, gout, history of CVA, anemia of chronic kidney disease, HLD who presented to [facility] ED on 09/27/2023 from home via EMS with reports of increased weakness, letharg and loose stools.  Was recently prescribed antibiotics for UTI.   Upon evaluation in the emergency department patient was found to have multiple SIRS criteria in the ED and with an initial unclear source was placed on broad-spectrum antibiotics and the hospitalist group was  called to assess the patient for admission in the hospital.  After a thorough workup including CT imaging revealing no definitive source of infection and negative COVID/influenza/RSV stool studies were sent for ongoing loose stools which were positive for C. difficile.  Patient was initiated on oral vancomycin and enteric precautions.  Hospital course was also complicated by acute kidney injury superimposed on chronic disease stage IIIb with metabolic acidosis for which the patient was managed with intravenous fluids and oral sodium bicarbonate.  Possible course was also complicated by metabolic encephalopathy thought to be secondary to volume depletion and infection which gradually improved throughout the hospitalization.   Patient gradually clinically improved with resolution of her diarrhea.  Associated nausea and vomiting also resolved.  Oral intake gradually improved throughout the hospitalization.  Due to the prolonged hospitalization patient developed substantial weakness.  Physical therapy had recommended a need for skilled PT in a skilled nursing facility however after lengthy discussions with the daughter and patient they have declined placement in skilled nursing facility and wished to go home with resumption of home health services.  Patient is therefore being discharged in improved and stable additional 10/06/2023.    Pain control - [employer] Controlled Substance Reporting System database was reviewed. and patient was instructed, not to drive, operate heavy machinery, perform activities at heights, swimming or participation in water activities or provide baby-sitting services while on Pain, Sleep and Anxiety Medications; until their outpatient Physician has advised to do so again. Also recommended to not to take more than prescribed Pain, Sleep and Anxiety Medications.   Consultants: None Procedures performed: None Disposition: Home Diet recommendation:  Discharge Diet Orders  (From admission, onward)     Start  Ordered   10/06/23 0000  Diet - low sodium heart healthy        10/06/23 1519           Cardiac and Carb modified diet  DISCHARGE MEDICATION: Allergies as of 10/06/2023       Reactions   Sulfa Antibiotics Itching        Medication List     PAUSE taking these medications    [name] 50 MG tablet Wait to take this until your doctor or other care provider tells you to start again. Generic drug: abemaciclib TAKE 1 TABLET BY MOUTH TWO TIMES DAILY. SWALLOW WHOLE TABLETS; DO NOT CRUSH, CHEW, OR SPLIT TABLETS BEFORE SWALLOWING.       STOP taking these medications    cefadroxil 500 MG capsule Commonly known as: DURICEF       TAKE these medications    Accu-Chek FastClix Lancets Misc USE TO CHECK FASTING BLOOD SUGAR AND BEFORE MEALS AND AGAIN IF PATIENT FEELS BAD (SYMPTOMS OF HYPO) AS DIRECTED BY YOUR PRESCRIBER   Accu-Chek Guide test strip Generic drug: glucose blood USE TO CHECK FASTING BLOOD SUGAR AND BEFORE MEALS AND AGAIN IF PATIENT FEELS BAD; SYMPTOMS OF HYPOGLYCEMIA   Accu-Chek Guide w/Device Kit 1 each by Does not apply route as directed.   allopurinol 100 MG tablet Commonly known as: ZYLOPRIM Take 1 tablet (100 mg total) by mouth daily.   amLODipine 10 MG tablet Commonly known as: NORVASC Take 1 tablet (10 mg total) by mouth daily.   aspirin EC 81 MG tablet Take 81 mg by mouth in the morning. Swallow whole.   atorvastatin 80 MG tablet Commonly known as: LIPITOR TAKE 1 TABLET EVERY EVENING   carvedilol 25 MG tablet Commonly known as: COREG Take 1 tablet (25 mg total) by mouth 2 (two) times daily with a meal.   cholecalciferol 25 MCG (1000 UNIT) tablet Commonly known as: VITAMIN D3 Take 1,000 Units by mouth daily.   ferrous sulfate 325 (65 FE) MG EC tablet Take 1 tablet (325 mg total) by mouth in the morning and at bedtime.   gabapentin 100 MG capsule Commonly known as: NEURONTIN Take 1 capsule (100 mg  total) by mouth 3 (three) times daily. What changed: Another medication with the same name was removed. Continue taking this medication, and follow the directions you see here.   letrozole 2.5 MG tablet Commonly known as: FEMARA TAKE 1 TABLET BY MOUTH DAILY   metFORMIN 500 MG tablet Commonly known as: GLUCOPHAGE Take 1 tablet (500 mg total) by mouth daily with breakfast.   multivitamin with minerals Tabs tablet Take 1 tablet by mouth daily.   ondansetron 8 MG tablet Commonly known as: ZOFRAN Take 1 tablet (8 mg total) by mouth every 8 (eight) hours as needed.   polyethylene glycol 17 g packet Commonly known as: MIRALAX / GLYCOLAX Take 17 g by mouth daily as needed (constipation.).       ASK your doctor about these medications    vancomycin 125 MG capsule Commonly known as: VANCOCIN Take 1 capsule (125 mg total) by mouth 4 (four) times daily for 3 days. Ask about: Should I take this medication?        Follow-up Information     [name], MD. Schedule an appointment as soon as possible for a visit in 1 week(s).   Specialty: Family Medicine Contact information: [address] [place] [address] [place] [address] [phone]         Serena Croissant,  MD Follow up.   Specialty: Hematology and Oncology Contact information: [address] [place] [address] [phone]                 Discharge Exam: Ceasar Mons Weights   09/27/23 2015  Weight: 71.2 kg    Constitutional: Awake alert and oriented x3, no associated distress.   Respiratory: clear to auscultation bilaterally, no wheezing, no crackles. Normal respiratory effort. No accessory muscle use.  Cardiovascular: Regular rate and rhythm, no murmurs / rubs / gallops. No extremity edema. 2+ pedal pulses. No carotid bruits.  Abdomen: Abdomen is soft and nontender.  No evidence of intra-abdominal masses.  Positive bowel sounds noted in all quadrants.   Musculoskeletal: No joint  deformity upper and lower extremities. Good ROM, no contractures. Normal muscle tone.     Condition at discharge: fair  The results of significant diagnostics from this hospitalization (including imaging, microbiology, ancillary and laboratory) are listed below for reference.   Imaging Studies: CT CHEST WO CONTRAST Result Date: 09/29/2023 CLINICAL DATA:  Respiratory illness nondiagnostic x-ray. EMR history includes left breast mass. Diabetes. Chronic kidney disease. Metabolic encephalopathy. * Tracking Code: BO * EXAM: CT CHEST WITHOUT CONTRAST TECHNIQUE: Multidetector CT imaging of the chest was performed following the standard protocol without IV contrast. RADIATION DOSE REDUCTION: This exam was performed according to the departmental dose-optimization program which includes automated exposure control, adjustment of the mA and/or kV according to patient size and/or use of iterative reconstruction technique. COMPARISON:  09/20/2023 CTA chest.  Abdominal CT 09/27/2023. FINDINGS: Cardiovascular: Aortic atherosclerosis. Tortuous thoracic aorta. Moderate cardiomegaly, without pericardial effusion. Three vessel coronary artery calcification. Mediastinum/Nodes: No axillary adenopathy. 8 mm AP window node is similar and mildly enlarged on 42/2. Hilar regions poorly evaluated without intravenous contrast. Small hiatal hernia. Left internal mammary soft tissue fullness including on 39/2, similar. Lungs/Pleura: Small left and trace right pleural effusions, new since the prior chest CT. The left effusion is increased from 09/27/2023 abdominal CT. Mild motion degradation throughout. This is most significant inferiorly. Dependent left lower lobe compressive atelectasis. A nodule within the left major fissure of 11 mm on 53/2 is similar on 09/20/2023 CT. No evidence of pneumonia Upper Abdomen: Diffuse hepatic metastasis, suboptimally evaluated secondary to lack of IV contrast and the extent of motion. Trace perihepatic  ascites. Musculoskeletal: Anasarca. Lateral left breast mass of 3.8 cm on 51/2 is relatively similar to the prior exam (when remeasured). Left breast skin thickening. Loop recorder within the anterior chest wall. Sternal body sclerotic metastasis with pathologic fracture, suboptimally evaluated. IMPRESSION: 1. Motion degradation 2. No evidence of pneumonia. 3. Small bilateral pleural effusions. Similar nodularity along the left major fissure, likely related to pleural metastasis. 4. Diffuse hepatic metastasis, suboptimally evaluated. 5. Relatively similar mild mediastinal adenopathy and left internal mammary soft tissue fullness (likely nodal metastasis). 6. Grossly similar sternal osseous metastasis with pathologic fracture. 7. Relatively similar left breast mass and overlying skin thickening. 8. Trace perihepatic ascites. Electronically Signed   By: Jeronimo Greaves M.D.   On: 09/29/2023 17:47   [place] ASCITES (ABDOMEN LIMITED) Result Date: 09/28/2023 CLINICAL DATA:  Patient with history of metastatic breast cancer, including hepatic lesions. Weakness, abdominal discomfort. Ascites seen on recent CT scan. Request for possible paracentesis. EXAM: LIMITED ABDOMEN ULTRASOUND FOR ASCITES TECHNIQUE: Limited ultrasound survey for ascites was performed in all four abdominal quadrants. COMPARISON:  CT abdomen and pelvis 09/27/2023 FINDINGS: Limited ultrasound of four-quadrant finds small volume of perihepatic and low pelvic ascites. This is  insufficient for safe paracentesis. Procedure not performed. IMPRESSION: Small volume perihepatic and pelvic ascites. Procedure performed by Brayton El PA-C and supervised by Dr. Marliss Coots Electronically Signed   By: Marliss Coots M.D.   On: 09/28/2023 16:38   CT HEAD WO CONTRAST ( ) Result Date: 09/28/2023 CLINICAL DATA:  Mental status change with unknown cause EXAM: CT HEAD WITHOUT CONTRAST TECHNIQUE: Contiguous axial images were obtained from the base of the skull through the  vertex without intravenous contrast. RADIATION DOSE REDUCTION: This exam was performed according to the departmental dose-optimization program which includes automated exposure control, adjustment of the mA and/or kV according to patient size and/or use of iterative reconstruction technique. COMPARISON:  09/20/2023 FINDINGS: Brain: No evidence of acute infarction, hemorrhage, hydrocephalus, extra-axial collection or mass lesion/mass effect. Patchy low-density in the cerebral white matter attributed to chronic small vessel ischemia. Chronic left occipital infarct. Vascular: No hyperdense vessel or unexpected calcification. Skull: Normal. Negative for fracture or focal lesion. Sinuses/Orbits: No acute finding. IMPRESSION: No acute or interval finding. Electronically Signed   By: Tiburcio Pea M.D.   On: 09/28/2023 06:56   CT ABDOMEN PELVIS WO CONTRAST Result Date: 09/27/2023 CLINICAL DATA:  Possible bowel obstruction lethargy weakness EXAM: CT ABDOMEN AND PELVIS WITHOUT CONTRAST TECHNIQUE: Multidetector CT imaging of the abdomen and pelvis was performed following the standard protocol without IV contrast. RADIATION DOSE REDUCTION: This exam was performed according to the departmental dose-optimization program which includes automated exposure control, adjustment of the mA and/or kV according to patient size and/or use of iterative reconstruction technique. COMPARISON:  CT 09/20/2023 FINDINGS: Lower chest: Lung bases demonstrate small left-sided pleural effusion and mild pleural nodularity as seen on the prior exam. Skin thickening left breast with partially visualized mass in the lateral aspect of left breast. Small adjacent low lateral lymph nodes measuring up to 7 mm. Hepatobiliary: Numerous hypodense liver lesions corresponding to history of metastatic disease. Probable small gallstones. No biliary dilatation Pancreas: Unremarkable. No pancreatic ductal dilatation or surrounding inflammatory changes. Spleen:  Normal in size without focal abnormality. Adrenals/Urinary Tract: Stable adrenal glands. No hydronephrosis. The bladder is unremarkable Stomach/Bowel: The stomach is nonenlarged. There is no dilated small bowel. Some air distension of colon but no obstruction. No acute bowel wall thickening Vascular/Lymphatic: Advanced aortic atherosclerosis. No aneurysm. Gastrohepatic lymph nodes measuring up to 11 mm. Reproductive: Hysterectomy. Lobulated soft tissue density in the left posterior pelvis suspected to represent ovary, this measures 3.7 x 2.4 cm on series 2, image 66. Other: No free air. Increased abdominopelvic ascites compared to the CT performed several days prior. Musculoskeletal: No acute or suspicious osseous abnormality. IMPRESSION: 1. No convincing evidence for bowel obstruction or bowel inflammatory process. 2. Increased abdominopelvic ascites compared to the most recent prior 3. Small left-sided pleural effusion with pleural nodularity consistent with metastatic disease. Numerous hepatic metastatic lesions as seen on the prior exam 4. Lobulated left posterior pelvic soft tissue density possibly prominent left ovary, could be correlated with nonemergent pelvic ultrasound if desired. 5. Incompletely visualized left outer breast mass. Electronically Signed   By: Jasmine Pang M.D.   On: 09/27/2023 23:53   DG Chest Port 1 View Result Date: 09/27/2023 CLINICAL DATA:  Increased lethargy and weakness, initial encounter EXAM: PORTABLE CHEST 1 VIEW COMPARISON:  09/24/2023 FINDINGS: Cardiac shadow is stable. Loop recorder is again noted. Aortic calcifications are seen. The lungs are well aerated without focal infiltrate. Previously seen left effusion has resolved. No bony abnormality is noted. IMPRESSION: No active disease.  Electronically Signed   By: Alcide Clever M.D.   On: 09/27/2023 21:46   DG Chest 2 View Result Date: 09/24/2023 CLINICAL DATA:  Weakness. EXAM: CHEST - 2 VIEW COMPARISON:  Chest radiograph  dated 09/20/2023. FINDINGS: Small left pleural effusion and left lung base atelectasis. The right lung is clear. No pneumothorax. Stable mild cardiomegaly. Loop recorder device. Atherosclerotic calcification of the aorta. No acute osseous pathology. IMPRESSION: Small left pleural effusion and left lung base atelectasis. Electronically Signed   By: Elgie Collard M.D.   On: 09/24/2023 11:27    Microbiology: Results for orders placed or performed during the hospital encounter of 09/27/23  Blood Culture (routine x 2)     Status: None   Collection Time: 09/27/23  8:24 PM   Specimen: BLOOD  Result Value Ref Range Status   Specimen Description   Final    BLOOD BLOOD RIGHT HAND Performed at [facility], [address] [place], [place] [address]    Special Requests   Final    BOTTLES DRAWN AEROBIC AND ANAEROBIC Blood Culture adequate volume Performed at [facility], [address] [place], [place] [address]    Culture   Final    NO GROWTH 5 DAYS Performed at [facility], [address] [place], [place] [address]    Report Status 10/02/2023 FINAL  Final  Resp panel by RT-PCR (RSV, Flu A&B, Covid) Anterior Nasal Swab     Status: None   Collection Time: 09/27/23  9:31 PM   Specimen: Anterior Nasal Swab  Result Value Ref Range Status   SARS Coronavirus 2 by RT PCR NEGATIVE NEGATIVE Final    Comment: (NOTE) SARS-CoV-2 target nucleic acids are NOT DETECTED.  The SARS-CoV-2 RNA is generally detectable in upper respiratory specimens during the acute phase of infection. The lowest concentration of SARS-CoV-2 viral copies this assay can detect is 138 copies/mL. A negative result does not preclude SARS-Cov-2 infection and should not be used as the sole basis for treatment or other patient management decisions. A negative result may occur with  improper specimen collection/handling, submission of specimen other than nasopharyngeal swab, presence of  viral mutation(s) within the areas targeted by this assay, and inadequate number of viral copies(<138 copies/mL). A negative result must be combined with clinical observations, patient history, and epidemiological information. The expected result is Negative.  Fact Sheet for Patients:  BloggerCourse.com  Fact Sheet for Healthcare Providers:  SeriousBroker.it  This test is no t yet approved or cleared by the [place] FDA and  has been authorized for detection and/or diagnosis of SARS-CoV-2 by FDA under an Emergency Use Authorization (EUA). This EUA will remain  in effect (meaning this test can be used) for the duration of the COVID-19 declaration under Section 564(b)(1) of the Act, 21 U.S.C.section 360bbb-3(b)(1), unless the authorization is terminated  or revoked sooner.       Influenza A by PCR NEGATIVE NEGATIVE Final   Influenza B by PCR NEGATIVE NEGATIVE Final    Comment: (NOTE) The Xpert Xpress SARS-CoV-2/FLU/RSV plus assay is intended as an aid in the diagnosis of influenza from Nasopharyngeal swab specimens and should not be used as a sole basis for treatment. Nasal washings and aspirates are unacceptable for Xpert Xpress SARS-CoV-2/FLU/RSV testing.  Fact Sheet for Patients: BloggerCourse.com  Fact Sheet for Healthcare Providers: SeriousBroker.it  This test is not yet approved or cleared by the [place] FDA and has been authorized for detection and/or diagnosis of  SARS-CoV-2 by FDA under an Emergency Use Authorization (EUA). This EUA will remain in effect (meaning this test can be used) for the duration of the COVID-19 declaration under Section 564(b)(1) of the Act, 21 U.S.C. section 360bbb-3(b)(1), unless the authorization is terminated or revoked.     Resp Syncytial Virus by PCR NEGATIVE NEGATIVE Final    Comment: (NOTE) Fact Sheet for  Patients: BloggerCourse.com  Fact Sheet for Healthcare Providers: SeriousBroker.it  This test is not yet approved or cleared by the [place] FDA and has been authorized for detection and/or diagnosis of SARS-CoV-2 by FDA under an Emergency Use Authorization (EUA). This EUA will remain in effect (meaning this test can be used) for the duration of the COVID-19 declaration under Section 564(b)(1) of the Act, 21 U.S.C. section 360bbb-3(b)(1), unless the authorization is terminated or revoked.  Performed at [facility], [address] [place], [place] [address]   Urine Culture (for pregnant, neutropenic or urologic patients or patients with an indwelling urinary catheter)     Status: None   Collection Time: 09/27/23  9:42 PM   Specimen: Urine, Clean Catch  Result Value Ref Range Status   Specimen Description   Final    URINE, CLEAN CATCH Performed at [facility], [address] [place], [place] [address]    Special Requests   Final    NONE Performed at [facility], [address], [place], [place] [address]    Culture   Final    NO GROWTH Performed at [facility], [address] [place], [place] [address]    Report Status 09/29/2023 FINAL  Final  Blood Culture (routine x 2)     Status: None   Collection Time: 09/28/23  2:09 PM   Specimen: BLOOD LEFT ARM  Result Value Ref Range Status   Specimen Description   Final    BLOOD LEFT ARM Performed at [facility], [address], [place], [place] [address]    Special Requests   Final    BOTTLES DRAWN AEROBIC AND ANAEROBIC Blood Culture results may not be optimal due to an inadequate volume of blood received in culture bottles Performed at [facility], [address] [place], [place] [address]    Culture   Final    NO GROWTH 5 DAYS Performed at [facility], [address], [place], [place] [address]    Report Status 10/03/2023 FINAL  Final  MRSA Next Gen by PCR, Nasal     Status: None   Collection Time: 09/28/23  3:00 PM   Specimen: Nasal Mucosa; Nasal Swab  Result Value Ref Range Status   MRSA by PCR Next Gen NOT DETECTED NOT DETECTED Final    Comment: (NOTE) The GeneXpert MRSA Assay (FDA approved for NASAL specimens only), is one component of a comprehensive MRSA colonization surveillance program. It is not intended to diagnose MRSA infection nor to guide or monitor treatment for MRSA infections. Test performance is not FDA approved in patients less than 30 years old. Performed at [facility], [address] [place], [place] [address]   C Difficile Quick Screen w PCR reflex     Status: Abnormal   Collection Time: 09/29/23  9:18 AM   Specimen: STOOL  Result Value Ref Range Status   C Diff antigen POSITIVE (A) NEGATIVE Final   C Diff toxin NEGATIVE NEGATIVE Final   C Diff interpretation Results are indeterminate. See  PCR results.  Final    Comment: Performed at [facility], [address] [place], [place] [address]  Gastrointestinal Panel by PCR , Stool     Status: None   Collection Time: 09/29/23  9:18 AM   Specimen: STOOL  Result Value Ref Range Status   Campylobacter species NOT DETECTED NOT DETECTED Final   Plesimonas shigelloides NOT DETECTED NOT DETECTED Final   Salmonella species NOT DETECTED NOT DETECTED Final   Yersinia enterocolitica NOT DETECTED NOT DETECTED Final   Vibrio species NOT DETECTED NOT DETECTED Final   Vibrio cholerae NOT DETECTED NOT DETECTED Final   Enteroaggregative E coli (EAEC) NOT DETECTED NOT DETECTED Final   Enteropathogenic E coli (EPEC) NOT DETECTED NOT DETECTED Final   Enterotoxigenic E coli (ETEC) NOT DETECTED NOT DETECTED Final   Shiga like toxin producing E coli (STEC) NOT DETECTED NOT DETECTED Final   Shigella/Enteroinvasive E coli (EIEC) NOT DETECTED NOT DETECTED  Final   Cryptosporidium NOT DETECTED NOT DETECTED Final   Cyclospora cayetanensis NOT DETECTED NOT DETECTED Final   Entamoeba histolytica NOT DETECTED NOT DETECTED Final   Giardia lamblia NOT DETECTED NOT DETECTED Final   Adenovirus F40/41 NOT DETECTED NOT DETECTED Final   Astrovirus NOT DETECTED NOT DETECTED Final   Norovirus GI/GII NOT DETECTED NOT DETECTED Final   Rotavirus A NOT DETECTED NOT DETECTED Final   Sapovirus (I, II, IV, and V) NOT DETECTED NOT DETECTED Final    Comment: Performed at [facility], [address] [place], [place] [address]    Labs: CBC: No results for input(s): "WBC", "NEUTROABS", "HGB", "HCT", "MCV", "PLT" in the last 168 hours. Basic Metabolic Panel: No results for input(s): "NA", "K", "CL", "CO2", "GLUCOSE", "BUN", "CREATININE", "CALCIUM", "MG", "PHOS" in the last 168 hours. Liver Function Tests: No results for input(s): "AST", "ALT", "ALKPHOS", "BILITOT", "PROT", "ALBUMIN" in the last 168 hours. CBG: No results for input(s): "GLUCAP" in the last 168 hours.  Discharge time spent: greater than 30 minutes.  Signed: Marinda Elk, MD [facility] 10/22/2023

## 2023-10-23 ENCOUNTER — Encounter: Payer: Self-pay | Admitting: Physician Assistant

## 2023-10-23 ENCOUNTER — Ambulatory Visit: Attending: Physician Assistant | Admitting: Physician Assistant

## 2023-10-23 VITALS — BP 132/65 | HR 78 | Temp 98.2°F | Resp 24 | Wt 164.0 lb

## 2023-10-23 DIAGNOSIS — E1141 Type 2 diabetes mellitus with diabetic mononeuropathy: Secondary | ICD-10-CM | POA: Diagnosis not present

## 2023-10-23 DIAGNOSIS — C50919 Malignant neoplasm of unspecified site of unspecified female breast: Secondary | ICD-10-CM | POA: Diagnosis not present

## 2023-10-23 DIAGNOSIS — R180 Malignant ascites: Secondary | ICD-10-CM

## 2023-10-23 DIAGNOSIS — Z09 Encounter for follow-up examination after completed treatment for conditions other than malignant neoplasm: Secondary | ICD-10-CM

## 2023-10-23 DIAGNOSIS — R609 Edema, unspecified: Secondary | ICD-10-CM | POA: Diagnosis not present

## 2023-10-23 MED ORDER — FUROSEMIDE 20 MG PO TABS: 20.0000 mg | ORAL_TABLET | Freq: Every day | ORAL | 1 refills | Status: DC

## 2023-10-23 NOTE — Progress Notes (Signed)
 No appetite " eat because I have to eat"  Denies abdominal pain or diarrhea

## 2023-10-23 NOTE — Progress Notes (Addendum)
 Patient ID: Heather Rogers, female   DOB: 1946/04/20, 78 y.o.   MRN: [id number]    Heather Rogers, is a 78 y.o. female  QIO:[id number]  LKG:[id number]  DOB - 1946/02/23  Chief Complaint  Patient presents with   Hospitalization Follow-up       Subjective:   Heather Rogers is a 78 y.o. female here today for a follow up visit after hospitalization 2/28-10/06/2023 Hospitalization with C diff and found to have malignant ascites from breast CA.  She has a f/up with oncology 10/28/2023.  She completed her antibiotics.  Her appetite is improving over the last 2 days.  Bowels are moving.  Her daughter is with her today.  She is taking care of her.  No vomiting.  Nausea has improved.  She denies an increase or change in abdominal swelling unless a little improved.  She is having mild swelling in her lower legs.  She urinates about once every 3 to 4 hours.  Some leg swelling.  She is wearing TED stockings.  Yesterday she ate salmon and vegetables, egg, [place] bacon, and 2 protein drinks  From discharge summary: Please take all prescribed medications exactly as instructed including the remaining course of your oral Vancomycin. Please consume a low sodium diet diet Please increase your physical activity as tolerated. Please maintain all outpatient follow-up appointments including follow-up with your primary care provider and Dr. Pamelia Hoit your oncologist Please return to the emergency department if you develop worsening abdominal pain,diarrhea, fevers in excess of 100.4 F, weakness or inability to tolerate oral intake.   Discharge Diagnoses: Principal Problem:   C. difficile colitis Active Problems:   Acute renal failure superimposed on stage 3b chronic kidney disease (HCC)   Acute metabolic encephalopathy   Elevated LFTs   Malignant ascites   Acute on chronic anemia   Breast cancer metastasized to liver, left (HCC)   Generalized weakness   Protein-calorie malnutrition, moderate (HCC)    Uncontrolled type 2 diabetes mellitus with hyperglycemia, with long-term current use of insulin (HCC)   Resolved Problems:   Hyponatremia     Hospital Course: 78 y.o. female with past medical history significant for breast cancer with metastasis to liver, lung, bone with concern for thyroid involvement with nodule, DM2, HTN, gout, history of CVA, anemia of chronic kidney disease, HLD who presented to [facility] ED on 09/27/2023 from home via EMS with reports of increased weakness, letharg and loose stools.  Was recently prescribed antibiotics for UTI.    Upon evaluation in the emergency department patient was found to have multiple SIRS criteria in the ED and with an initial unclear source was placed on broad-spectrum antibiotics and the hospitalist group was called to assess the patient for admission in the hospital.   After a thorough workup including CT imaging revealing no definitive source of infection and negative COVID/influenza/RSV stool studies were sent for ongoing loose stools which were positive for C. difficile.   Patient was initiated on oral vancomycin and enteric precautions.   Hospital course was also complicated by acute kidney injury superimposed on chronic disease stage IIIb with metabolic acidosis for which the patient was managed with intravenous fluids and oral sodium bicarbonate.  Possible course was also complicated by metabolic encephalopathy thought to be secondary to volume depletion and infection which gradually improved throughout the hospitalization.    Patient gradually clinically improved with resolution of her diarrhea.  Associated nausea and vomiting also resolved.  Oral intake gradually improved throughout the hospitalization.  Due to the prolonged hospitalization patient developed substantial weakness.  Physical therapy had recommended a need for skilled PT in a skilled nursing facility however after lengthy discussions with the daughter and patient they  have declined placement in skilled nursing facility and wished to go home with resumption of home health services.   Patient is therefore being discharged in improved and stable additional 10/06/2023.       Pain control - [employer] Controlled Substance Reporting System database was reviewed. and patient was instructed, not to drive, operate heavy machinery, perform activities at heights, swimming or participation in water activities or provide baby-sitting services while on Pain, Sleep and Anxiety Medications; until their outpatient Physician has advised to do so again. Also recommended to not to take more than prescribed Pain, Sleep and Anxiety Medications.  No problems updated.  ALLERGIES: Allergies  Allergen Reactions   Sulfa Antibiotics Itching    PAST MEDICAL HISTORY: Past Medical History:  Diagnosis Date   Chronic kidney disease, stage 3b (HCC)    Diabetes (HCC)    Hypertension    Pneumonia due to COVID-19 virus 07/25/2020   Primary malignant neoplasm of breast with metastasis (HCC) 09/14/2021   Stroke (cerebrum) (HCC)    Stroke (HCC) 05/15/2018    MEDICATIONS AT HOME: Prior to Admission medications   Medication Sig Start Date End Date Taking? Authorizing Provider  Accu-Chek FastClix Lancets MISC USE TO CHECK FASTING BLOOD SUGAR AND BEFORE MEALS AND AGAIN IF PATIENT FEELS BAD (SYMPTOMS OF HYPO) AS DIRECTED BY YOUR PRESCRIBER 10/12/22  Yes [name], MD  allopurinol (ZYLOPRIM) 100 MG tablet Take 1 tablet (100 mg total) by mouth daily. 02/19/23  Yes [name], MD  amLODipine (NORVASC) 10 MG tablet Take 1 tablet (10 mg total) by mouth daily. 02/19/23  Yes [name], MD  aspirin EC 81 MG tablet Take 81 mg by mouth in the morning. Swallow whole.   Yes [provider]  atorvastatin (LIPITOR) 80 MG tablet TAKE 1 TABLET EVERY EVENING 04/22/23  Yes Newlin, Enobong, MD  Blood Glucose Monitoring Suppl (ACCU-CHEK GUIDE) w/Device KIT 1 each by Does not apply  route as directed. 08/25/18  Yes Fulp, Cammie, MD  carvedilol (COREG) 25 MG tablet Take 1 tablet (25 mg total) by mouth 2 (two) times daily with a meal. 02/19/23  Yes Newlin, Enobong, MD  cholecalciferol (VITAMIN D3) 25 MCG (1000 UNIT) tablet Take 1,000 Units by mouth daily.   Yes [provider]  ferrous sulfate 325 (65 FE) MG EC tablet Take 1 tablet (325 mg total) by mouth in the morning and at bedtime. 05/22/21  Yes Mayers, Cari S, PA-C  furosemide (LASIX) 20 MG tablet Take 1 tablet (20 mg total) by mouth daily. For  3 to 5 days prn leg swelling 10/23/23  Yes Anders Simmonds, PA-C  gabapentin (NEURONTIN) 100 MG capsule Take 1 capsule (100 mg total) by mouth 3 (three) times daily. 02/19/23  Yes Newlin, Enobong, MD  glucose blood (ACCU-CHEK GUIDE) test strip USE TO CHECK FASTING BLOOD SUGAR AND BEFORE MEALS AND AGAIN IF PATIENT FEELS BAD; SYMPTOMS OF HYPOGLYCEMIA 09/20/21  Yes [name], MD  metFORMIN (GLUCOPHAGE) 500 MG tablet Take 1 tablet (500 mg total) by mouth daily with breakfast. 08/22/23  Yes [name], MD  Multiple Vitamin (MULTIVITAMIN WITH MINERALS) TABS tablet Take 1 tablet by mouth daily. 09/09/21  Yes Ghimire, Werner Lean, MD  ondansetron (ZOFRAN) 8 MG tablet Take 1 tablet (8 mg total) by mouth every 8 (eight)  hours as needed. 10/06/23  Yes Shalhoub, Deno Lunger, MD  polyethylene glycol (MIRALAX / GLYCOLAX) 17 g packet Take 17 g by mouth daily as needed (constipation.).   Yes Serena Croissant, MD  [name] 50 MG tablet TAKE 1 TABLET BY MOUTH TWO TIMES DAILY. SWALLOW WHOLE TABLETS; DO NOT CRUSH, CHEW, OR SPLIT TABLETS BEFORE SWALLOWING. 04/26/23  Yes Serena Croissant, MD    ROS: Neg HEENT Neg resp Neg cardiac Neg GI Neg GU Neg MS Neg psych Neg neuro  Objective:   Vitals:   10/23/23 1059  BP: 132/65  Pulse: 78  Resp: (!) 24  Temp: 98.2 F (36.8 C)  TempSrc: Oral  SpO2: 96%  Weight: 164 lb (74.4 kg)   Exam General appearance : Awake, alert, not in any distress.  Speech Clear. Not toxic looking HEENT: Atraumatic and Normocephalic, sclera are non-icteric.   Neck: Supple, no JVD. No cervical lymphadenopathy.  Chest: Good air entry bilaterally, CTAB.  No rales/rhonchi/wheezing CVS: S1 S2 regular, no murmurs.  Abdomen: Bowel sounds present, Non tender.  Her abdomen is distended with no gaurding, rigidity or rebound.  +fluid wave Extremities: B/L Lower Ext shows 2+ edema, both legs are warm to touch Neurology: Awake alert, and oriented X 3, CN II-XII intact, Non focal Skin: No Rash  Data Review Lab Results  Component Value Date   HGBA1C 7.4 (H) 09/28/2023   HGBA1C 6.7 08/22/2023   HGBA1C 6.2 05/22/2023    Assessment & Plan   1. Edema, unspecified type (Primary) Her sodium and potassium normal 10/06/2023 and Cr=1.02 - furosemide (LASIX) 20 MG tablet; Take 1 tablet (20 mg total) by mouth daily. For  3 to 5 days prn leg swelling  Dispense: 30 tablet; Refill: 1  2. Type 2 diabetes mellitus with diabetic mononeuropathy, without long-term current use of insulin (HCC) Checking blood sugars and all 125  3. Primary malignant neoplasm of breast with metastasis (HCC) Sees heme onc 3/31  4. Malignant ascites See #3  Hospital follow up with high risk readmission    Return for PCP for chronic conditions as scheduled in April.  The patient was given clear instructions to go to ER or return to medical center if symptoms don't improve, worsen or new problems develop. The patient verbalized understanding. The patient was told to call to get lab results if they haven't heard anything in the next week.      Georgian Co, PA-C [facility] [place], [place] [phone]   10/23/2023, 12:36 PM

## 2023-10-24 DIAGNOSIS — C7800 Secondary malignant neoplasm of unspecified lung: Secondary | ICD-10-CM | POA: Diagnosis not present

## 2023-10-24 DIAGNOSIS — C787 Secondary malignant neoplasm of liver and intrahepatic bile duct: Secondary | ICD-10-CM | POA: Diagnosis not present

## 2023-10-24 DIAGNOSIS — N179 Acute kidney failure, unspecified: Secondary | ICD-10-CM | POA: Diagnosis not present

## 2023-10-24 DIAGNOSIS — G9341 Metabolic encephalopathy: Secondary | ICD-10-CM | POA: Diagnosis not present

## 2023-10-24 DIAGNOSIS — D630 Anemia in neoplastic disease: Secondary | ICD-10-CM | POA: Diagnosis not present

## 2023-10-24 DIAGNOSIS — E1122 Type 2 diabetes mellitus with diabetic chronic kidney disease: Secondary | ICD-10-CM | POA: Diagnosis not present

## 2023-10-24 DIAGNOSIS — E1159 Type 2 diabetes mellitus with other circulatory complications: Secondary | ICD-10-CM | POA: Diagnosis not present

## 2023-10-24 DIAGNOSIS — C50912 Malignant neoplasm of unspecified site of left female breast: Secondary | ICD-10-CM | POA: Diagnosis not present

## 2023-10-24 DIAGNOSIS — I152 Hypertension secondary to endocrine disorders: Secondary | ICD-10-CM | POA: Diagnosis not present

## 2023-10-25 DIAGNOSIS — D630 Anemia in neoplastic disease: Secondary | ICD-10-CM | POA: Diagnosis not present

## 2023-10-25 DIAGNOSIS — I152 Hypertension secondary to endocrine disorders: Secondary | ICD-10-CM | POA: Diagnosis not present

## 2023-10-25 DIAGNOSIS — E1159 Type 2 diabetes mellitus with other circulatory complications: Secondary | ICD-10-CM | POA: Diagnosis not present

## 2023-10-25 DIAGNOSIS — C787 Secondary malignant neoplasm of liver and intrahepatic bile duct: Secondary | ICD-10-CM | POA: Diagnosis not present

## 2023-10-25 DIAGNOSIS — G9341 Metabolic encephalopathy: Secondary | ICD-10-CM | POA: Diagnosis not present

## 2023-10-25 DIAGNOSIS — N179 Acute kidney failure, unspecified: Secondary | ICD-10-CM | POA: Diagnosis not present

## 2023-10-25 DIAGNOSIS — C7800 Secondary malignant neoplasm of unspecified lung: Secondary | ICD-10-CM | POA: Diagnosis not present

## 2023-10-25 DIAGNOSIS — C50912 Malignant neoplasm of unspecified site of left female breast: Secondary | ICD-10-CM | POA: Diagnosis not present

## 2023-10-25 DIAGNOSIS — E1122 Type 2 diabetes mellitus with diabetic chronic kidney disease: Secondary | ICD-10-CM | POA: Diagnosis not present

## 2023-10-28 ENCOUNTER — Inpatient Hospital Stay: Attending: Hematology and Oncology | Admitting: Hematology and Oncology

## 2023-10-28 VITALS — BP 125/45 | HR 76 | Temp 97.8°F | Resp 18 | Ht 61.0 in | Wt 157.4 lb

## 2023-10-28 DIAGNOSIS — C787 Secondary malignant neoplasm of liver and intrahepatic bile duct: Secondary | ICD-10-CM

## 2023-10-28 DIAGNOSIS — Z7984 Long term (current) use of oral hypoglycemic drugs: Secondary | ICD-10-CM | POA: Insufficient documentation

## 2023-10-28 DIAGNOSIS — R531 Weakness: Secondary | ICD-10-CM | POA: Diagnosis not present

## 2023-10-28 DIAGNOSIS — Z1732 Human epidermal growth factor receptor 2 negative status: Secondary | ICD-10-CM | POA: Diagnosis not present

## 2023-10-28 DIAGNOSIS — R252 Cramp and spasm: Secondary | ICD-10-CM | POA: Insufficient documentation

## 2023-10-28 DIAGNOSIS — R251 Tremor, unspecified: Secondary | ICD-10-CM | POA: Diagnosis not present

## 2023-10-28 DIAGNOSIS — Z1721 Progesterone receptor positive status: Secondary | ICD-10-CM | POA: Insufficient documentation

## 2023-10-28 DIAGNOSIS — C50912 Malignant neoplasm of unspecified site of left female breast: Secondary | ICD-10-CM

## 2023-10-28 DIAGNOSIS — Z170 Estrogen receptor positive status [ER+]: Secondary | ICD-10-CM | POA: Insufficient documentation

## 2023-10-28 DIAGNOSIS — R410 Disorientation, unspecified: Secondary | ICD-10-CM | POA: Diagnosis not present

## 2023-10-28 DIAGNOSIS — C50919 Malignant neoplasm of unspecified site of unspecified female breast: Secondary | ICD-10-CM

## 2023-10-28 DIAGNOSIS — Z79899 Other long term (current) drug therapy: Secondary | ICD-10-CM | POA: Insufficient documentation

## 2023-10-28 DIAGNOSIS — J90 Pleural effusion, not elsewhere classified: Secondary | ICD-10-CM | POA: Diagnosis not present

## 2023-10-28 DIAGNOSIS — R609 Edema, unspecified: Secondary | ICD-10-CM | POA: Insufficient documentation

## 2023-10-28 NOTE — Progress Notes (Signed)
 START ON PATHWAY REGIMEN - Breast     A cycle is every 21 days:     Fam-trastuzumab deruxtecan-nxki   **Always confirm dose/schedule in your pharmacy ordering system**  Patient Characteristics: Distant Metastases or Locoregional Recurrent Disease - Unresected, M0 or Locally Advanced Unresectable Disease Progressing after Neoadjuvant and Local Therapies, M0, HER2 Low/Negative, ER Positive, Chemotherapy, HER2 Low, Second Line, [employer] or  Not a Candidate for Molecular Targeted Therapy Therapeutic Status: Distant Metastases HER2 Status: Low ER Status: Positive (+) PR Status: Positive (+) Therapy Approach Indicated: Standard Chemotherapy/Endocrine Therapy Line of Therapy: Second Line Intent of Therapy: Non-Curative / Palliative Intent, Discussed with Patient

## 2023-10-28 NOTE — Progress Notes (Signed)
 Patient Care Team: [name], MD as PCP - General (Family Medicine) Anselm Lis, RPH-CPP as Pharmacist (Hematology and Oncology) Serena Croissant, MD as Medical Oncologist (Hematology and Oncology) Riley Churches, RN as [facility] Care Management Meier, Ivor Costa, MD as Consulting Physician (Pulmonary Disease)  DIAGNOSIS:  Encounter Diagnoses  Name Primary?   Primary malignant neoplasm of breast with metastasis (HCC) Yes   Breast cancer metastasized to liver, left (HCC)     SUMMARY OF ONCOLOGIC HISTORY: Oncology History  Breast mass, left  09/04/2021 Initial Diagnosis   Breast mass, left   Primary malignant neoplasm of breast with metastasis (HCC)  09/14/2021 Initial Diagnosis   Large neglected left breast cancer for several years.  Hospitalization 09/02/2021-09/08/2021: Shortness of breath, large pleural effusion, thoracentesis, pleural fluid cytology: Metastatic adenocarcinoma breast origin ER/PR positive HER2 negative   11/04/2023 -  Chemotherapy   Patient is on Treatment Plan : BREAST Fam-Trastuzumab Deruxtecan-nxki (Enhertu) (5.4) q21d     Breast cancer metastasized to liver, left (HCC)  10/01/2023 Initial Diagnosis   Breast cancer metastasized to liver, left (HCC)   11/04/2023 -  Chemotherapy   Patient is on Treatment Plan : BREAST Fam-Trastuzumab Deruxtecan-nxki (Enhertu) (5.4) q21d       CHIEF COMPLIANT: Follow-up to discuss her treatment plan  HISTORY OF PRESENT ILLNESS:   History of Present Illness The patient, with a history of cancer, presents for a follow-up visit after a recent hospitalization. She reports feeling better, with less confusion and weakness compared to her hospital stay. However, she has had good and bad days, with some days where she does not want to eat. Despite this, the caregiver has been providing high-protein meals and shakes, and the patient has been able to eat well on some days. The patient has also been experiencing leg  cramps, which she has been managing with mustard. She has been taking Lasix for swelling, and her weight has decreased from 167 to 157 pounds. The patient's blood pressure was noted to be low at a recent primary care visit.     ALLERGIES:  is allergic to sulfa antibiotics.  MEDICATIONS:  Current Outpatient Medications  Medication Sig Dispense Refill   Accu-Chek FastClix Lancets MISC USE TO CHECK FASTING BLOOD SUGAR AND BEFORE MEALS AND AGAIN IF PATIENT FEELS BAD (SYMPTOMS OF HYPO) AS DIRECTED BY YOUR PRESCRIBER 102 each 11   allopurinol (ZYLOPRIM) 100 MG tablet Take 1 tablet (100 mg total) by mouth daily. 90 tablet 1   amLODipine (NORVASC) 10 MG tablet Take 1 tablet (10 mg total) by mouth daily. 90 tablet 1   aspirin EC 81 MG tablet Take 81 mg by mouth in the morning. Swallow whole.     atorvastatin (LIPITOR) 80 MG tablet TAKE 1 TABLET EVERY EVENING 90 tablet 3   Blood Glucose Monitoring Suppl (ACCU-CHEK GUIDE) w/Device KIT 1 each by Does not apply route as directed. 1 kit 0   carvedilol (COREG) 25 MG tablet Take 1 tablet (25 mg total) by mouth 2 (two) times daily with a meal. 180 tablet 1   cholecalciferol (VITAMIN D3) 25 MCG (1000 UNIT) tablet Take 1,000 Units by mouth daily.     ferrous sulfate 325 (65 FE) MG EC tablet Take 1 tablet (325 mg total) by mouth in the morning and at bedtime. 60 tablet 1   furosemide (LASIX) 20 MG tablet Take 1 tablet (20 mg total) by mouth daily. For  3 to 5 days prn leg swelling 30 tablet 1  gabapentin (NEURONTIN) 100 MG capsule Take 1 capsule (100 mg total) by mouth 3 (three) times daily. 270 capsule 1   glucose blood (ACCU-CHEK GUIDE) test strip USE TO CHECK FASTING BLOOD SUGAR AND BEFORE MEALS AND AGAIN IF PATIENT FEELS BAD; SYMPTOMS OF HYPOGLYCEMIA 100 strip 11   metFORMIN (GLUCOPHAGE) 500 MG tablet Take 1 tablet (500 mg total) by mouth daily with breakfast. 90 tablet 1   Multiple Vitamin (MULTIVITAMIN WITH MINERALS) TABS tablet Take 1 tablet by mouth  daily. 30 tablet 0   ondansetron (ZOFRAN) 8 MG tablet Take 1 tablet (8 mg total) by mouth every 8 (eight) hours as needed. 30 tablet 0   polyethylene glycol (MIRALAX / GLYCOLAX) 17 g packet Take 17 g by mouth daily as needed (constipation.).     [Paused] [name] 50 MG tablet TAKE 1 TABLET BY MOUTH TWO TIMES DAILY. SWALLOW WHOLE TABLETS; DO NOT CRUSH, CHEW, OR SPLIT TABLETS BEFORE SWALLOWING. 56 tablet 5   No current facility-administered medications for this visit.    PHYSICAL EXAMINATION: ECOG PERFORMANCE STATUS: 1 - Symptomatic but completely ambulatory  Vitals:   10/28/23 1106  BP: (!) 125/45  Pulse: 76  Resp: 18  Temp: 97.8 F (36.6 C)  SpO2: 94%   Filed Weights   10/28/23 1106  Weight: 157 lb 6.4 oz (71.4 kg)    Physical Exam VITALS: BP- 125/45 MEASUREMENTS: Weight- 157.  (exam performed in the presence of a chaperone)  LABORATORY DATA:  I have reviewed the data as listed    Latest Ref Rng & Units 10/06/2023   11:19 AM 10/05/2023    5:48 AM 10/04/2023    4:50 AM  CMP  Glucose 70 - 99 mg/dL 010  272  536   BUN 8 - 23 mg/dL 24  37  45   Creatinine 0.44 - 1.00 mg/dL 6.44  0.34  7.42   Sodium 135 - 145 mmol/L 143  142  143   Potassium 3.5 - 5.1 mmol/L 3.6  3.9  4.1   Chloride 98 - 111 mmol/L 115  118  116   CO2 22 - 32 mmol/L 19  17  16    Calcium 8.9 - 10.3 mg/dL 8.5  8.5  8.3   Total Protein 6.5 - 8.1 g/dL 5.8  5.4  6.0   Total Bilirubin 0.0 - 1.2 mg/dL 0.6  0.9  0.9   Alkaline Phos 38 - 126 U/L 127  121  149   AST 15 - 41 U/L 98  75  78   ALT 0 - 44 U/L 68  54  55     Lab Results  Component Value Date   WBC 10.0 10/06/2023   HGB 10.0 (L) 10/06/2023   HCT 29.9 (L) 10/06/2023   MCV 82.8 10/06/2023   PLT 327 10/06/2023   NEUTROABS 7.5 10/06/2023    ASSESSMENT & PLAN:  Primary malignant neoplasm of breast with metastasis (HCC) 09/14/2021:Large neglected left breast cancer for several years.  Hospitalization 09/02/2021-09/08/2021: Shortness of breath, large  pleural effusion, thoracentesis, pleural fluid cytology: Metastatic adenocarcinoma breast origin ER 40%, PR 30%, Ki-67 5%, HER2 2+ by IHC, FISH negative ratio 1.43    Prior treatment: [name] with letrozole (now reducing to 50 mg p.o. twice daily on 03/15/2022) 09/29/2023: CT chest: Mild mediastinal adenopathy stable, stable bone metastases 09/27/2023: CT abdomen: Numerous liver lesions, increase ascites, lobulated left posterior pelvic soft tissue density  Hospitalization 09/27/2023-10/06/2023: C. difficile colitis, acute renal failure, elevated LFTs Treatment plan: Recommend treatment with  Enhertu (HER2 2+ IHC positive) Plan to start Enhertu in 1 week Assessment & Plan Metastatic cancer Progression of cancer with metastasis to the liver and thyroid. Previous [name] treatment initially effective but now ineffective. Enhertu, a targeted chemotherapy combining Herceptin with chemotherapy molecules, proposed as next treatment. Enhertu targets cancer cells, potentially reducing side effects compared to traditional chemotherapy. Side effects include nausea, fatigue, decreased blood counts, and diarrhea. IV administration every three weeks, with potential port placement. Shared decision-making led to agreement to start Enhertu. Without treatment, disease progression is life-threatening. Enhertu expected to treat all affected areas, including liver and thyroid. - Initiate Enhertu IV treatment every three weeks - Monitor blood counts regularly - Educate on potential side effects of Enhertu, including nausea, fatigue, anemia, infection risk, and diarrhea - Discuss importance of hydration to aid chemotherapy effectiveness and elimination - Plan for port placement if treatment continues  Edema Swelling likely due to fluid retention. Lasix prescribed for management. Recent weight loss suggests reduction in fluid retention. Low blood pressure possibly related to inadequate fluid intake. - Continue Lasix as  prescribed - Encourage increased water intake to manage fluid balance and support chemotherapy  Nutritional intake Variable appetite with inconsistent intake. Receiving protein supplementation through Ensure Max and Fairlife milk. Caregiver provides nutritious meals. - Continue protein supplementation with Ensure Max and [employer] milk - Encourage balanced meals and monitor nutritional intake      Orders Placed This Encounter  Procedures   CBC with Differential (Cancer Center Only)    Standing Status:   Future    Expected Date:   11/04/2023    Expiration Date:   11/03/2024   CMP (Cancer Center only)    Standing Status:   Future    Expected Date:   11/04/2023    Expiration Date:   11/03/2024   CBC with Differential (Cancer Center Only)    Standing Status:   Future    Expected Date:   11/25/2023    Expiration Date:   11/24/2024   CMP (Cancer Center only)    Standing Status:   Future    Expected Date:   11/25/2023    Expiration Date:   11/24/2024   CBC with Differential (Cancer Center Only)    Standing Status:   Future    Expected Date:   12/16/2023    Expiration Date:   12/15/2024   CMP (Cancer Center only)    Standing Status:   Future    Expected Date:   12/16/2023    Expiration Date:   12/15/2024   CBC with Differential (Cancer Center Only)    Standing Status:   Future    Expected Date:   01/06/2024    Expiration Date:   01/05/2025   CMP (Cancer Center only)    Standing Status:   Future    Expected Date:   01/06/2024    Expiration Date:   01/05/2025   CBC with Differential (Cancer Center Only)    Standing Status:   Future    Expected Date:   01/27/2024    Expiration Date:   01/26/2025   CMP (Cancer Center only)    Standing Status:   Future    Expected Date:   01/27/2024    Expiration Date:   01/26/2025   CBC with Differential (Cancer Center Only)    Standing Status:   Future    Expected Date:   02/17/2024    Expiration Date:   02/16/2025   CMP (Cancer Center only)  Standing Status:    Future    Expected Date:   02/17/2024    Expiration Date:   02/16/2025   The patient has a good understanding of the overall plan. she agrees with it. she will call with any problems that may develop before the next visit here. Total time spent: 30 mins including face to face time and time spent for planning, charting and co-ordination of care   Tamsen Meek, MD 10/28/23

## 2023-10-28 NOTE — Assessment & Plan Note (Signed)
 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  Hospitalization 09/02/2021-09/08/2021: Shortness of breath, large pleural effusion, thoracentesis, pleural fluid cytology: Metastatic adenocarcinoma breast origin ER 40%, PR 30%, Ki-67 5%, HER2 2+ by IHC, FISH negative ratio 1.43    Prior treatment: [name] with letrozole (now reducing to 50 mg p.o. twice daily on 03/15/2022) 09/29/2023: CT chest: Mild mediastinal adenopathy stable, stable bone metastases 09/27/2023: CT abdomen: Numerous liver lesions, increase ascites, lobulated left posterior pelvic soft tissue density  Hospitalization 09/27/2023-10/06/2023: C. difficile colitis, acute renal failure, elevated LFTs Treatment plan: Recommend treatment with Enhertu (HER2 2+ IHC positive)

## 2023-10-29 ENCOUNTER — Other Ambulatory Visit: Payer: Self-pay

## 2023-10-29 DIAGNOSIS — D630 Anemia in neoplastic disease: Secondary | ICD-10-CM | POA: Diagnosis not present

## 2023-10-29 DIAGNOSIS — G9341 Metabolic encephalopathy: Secondary | ICD-10-CM | POA: Diagnosis not present

## 2023-10-29 DIAGNOSIS — C787 Secondary malignant neoplasm of liver and intrahepatic bile duct: Secondary | ICD-10-CM | POA: Diagnosis not present

## 2023-10-29 DIAGNOSIS — C50912 Malignant neoplasm of unspecified site of left female breast: Secondary | ICD-10-CM | POA: Diagnosis not present

## 2023-10-29 DIAGNOSIS — C7800 Secondary malignant neoplasm of unspecified lung: Secondary | ICD-10-CM | POA: Diagnosis not present

## 2023-10-29 DIAGNOSIS — I152 Hypertension secondary to endocrine disorders: Secondary | ICD-10-CM | POA: Diagnosis not present

## 2023-10-29 DIAGNOSIS — N179 Acute kidney failure, unspecified: Secondary | ICD-10-CM | POA: Diagnosis not present

## 2023-10-29 DIAGNOSIS — E1122 Type 2 diabetes mellitus with diabetic chronic kidney disease: Secondary | ICD-10-CM | POA: Diagnosis not present

## 2023-10-29 DIAGNOSIS — E1159 Type 2 diabetes mellitus with other circulatory complications: Secondary | ICD-10-CM | POA: Diagnosis not present

## 2023-10-30 DIAGNOSIS — G9341 Metabolic encephalopathy: Secondary | ICD-10-CM | POA: Diagnosis not present

## 2023-10-30 DIAGNOSIS — I152 Hypertension secondary to endocrine disorders: Secondary | ICD-10-CM | POA: Diagnosis not present

## 2023-10-30 DIAGNOSIS — C7800 Secondary malignant neoplasm of unspecified lung: Secondary | ICD-10-CM | POA: Diagnosis not present

## 2023-10-30 DIAGNOSIS — C787 Secondary malignant neoplasm of liver and intrahepatic bile duct: Secondary | ICD-10-CM | POA: Diagnosis not present

## 2023-10-30 DIAGNOSIS — N179 Acute kidney failure, unspecified: Secondary | ICD-10-CM | POA: Diagnosis not present

## 2023-10-30 DIAGNOSIS — E1122 Type 2 diabetes mellitus with diabetic chronic kidney disease: Secondary | ICD-10-CM | POA: Diagnosis not present

## 2023-10-30 DIAGNOSIS — D630 Anemia in neoplastic disease: Secondary | ICD-10-CM | POA: Diagnosis not present

## 2023-10-30 DIAGNOSIS — E1159 Type 2 diabetes mellitus with other circulatory complications: Secondary | ICD-10-CM | POA: Diagnosis not present

## 2023-10-30 DIAGNOSIS — C50912 Malignant neoplasm of unspecified site of left female breast: Secondary | ICD-10-CM | POA: Diagnosis not present

## 2023-11-01 DIAGNOSIS — I152 Hypertension secondary to endocrine disorders: Secondary | ICD-10-CM | POA: Diagnosis not present

## 2023-11-01 DIAGNOSIS — E1159 Type 2 diabetes mellitus with other circulatory complications: Secondary | ICD-10-CM | POA: Diagnosis not present

## 2023-11-01 DIAGNOSIS — N179 Acute kidney failure, unspecified: Secondary | ICD-10-CM | POA: Diagnosis not present

## 2023-11-01 DIAGNOSIS — C7800 Secondary malignant neoplasm of unspecified lung: Secondary | ICD-10-CM | POA: Diagnosis not present

## 2023-11-01 DIAGNOSIS — D630 Anemia in neoplastic disease: Secondary | ICD-10-CM | POA: Diagnosis not present

## 2023-11-01 DIAGNOSIS — E1122 Type 2 diabetes mellitus with diabetic chronic kidney disease: Secondary | ICD-10-CM | POA: Diagnosis not present

## 2023-11-01 DIAGNOSIS — C50912 Malignant neoplasm of unspecified site of left female breast: Secondary | ICD-10-CM | POA: Diagnosis not present

## 2023-11-01 DIAGNOSIS — G9341 Metabolic encephalopathy: Secondary | ICD-10-CM | POA: Diagnosis not present

## 2023-11-01 DIAGNOSIS — C787 Secondary malignant neoplasm of liver and intrahepatic bile duct: Secondary | ICD-10-CM | POA: Diagnosis not present

## 2023-11-04 ENCOUNTER — Telehealth: Payer: Self-pay | Admitting: Hematology and Oncology

## 2023-11-04 ENCOUNTER — Encounter: Payer: Self-pay | Admitting: Hematology and Oncology

## 2023-11-05 ENCOUNTER — Telehealth: Payer: Self-pay | Admitting: Hematology and Oncology

## 2023-11-05 ENCOUNTER — Telehealth: Payer: Self-pay | Admitting: *Deleted

## 2023-11-05 ENCOUNTER — Encounter: Payer: Self-pay | Admitting: Hematology and Oncology

## 2023-11-05 ENCOUNTER — Other Ambulatory Visit: Payer: Self-pay

## 2023-11-05 ENCOUNTER — Other Ambulatory Visit: Payer: Self-pay | Admitting: *Deleted

## 2023-11-05 DIAGNOSIS — G9341 Metabolic encephalopathy: Secondary | ICD-10-CM | POA: Diagnosis not present

## 2023-11-05 DIAGNOSIS — C787 Secondary malignant neoplasm of liver and intrahepatic bile duct: Secondary | ICD-10-CM | POA: Diagnosis not present

## 2023-11-05 DIAGNOSIS — C7800 Secondary malignant neoplasm of unspecified lung: Secondary | ICD-10-CM | POA: Diagnosis not present

## 2023-11-05 DIAGNOSIS — I152 Hypertension secondary to endocrine disorders: Secondary | ICD-10-CM | POA: Diagnosis not present

## 2023-11-05 DIAGNOSIS — C50912 Malignant neoplasm of unspecified site of left female breast: Secondary | ICD-10-CM | POA: Diagnosis not present

## 2023-11-05 DIAGNOSIS — C50919 Malignant neoplasm of unspecified site of unspecified female breast: Secondary | ICD-10-CM

## 2023-11-05 DIAGNOSIS — D630 Anemia in neoplastic disease: Secondary | ICD-10-CM | POA: Diagnosis not present

## 2023-11-05 DIAGNOSIS — N179 Acute kidney failure, unspecified: Secondary | ICD-10-CM | POA: Diagnosis not present

## 2023-11-05 DIAGNOSIS — E1122 Type 2 diabetes mellitus with diabetic chronic kidney disease: Secondary | ICD-10-CM | POA: Diagnosis not present

## 2023-11-05 DIAGNOSIS — E1159 Type 2 diabetes mellitus with other circulatory complications: Secondary | ICD-10-CM | POA: Diagnosis not present

## 2023-11-05 NOTE — Telephone Encounter (Signed)
 Received call from pt daughter [name] stating pt is experiencing severe decrease in appetite as well as dehydration due to not drinking enough p.o fluids. [name] also states pt is not very mobile and will sit in her recliner a majority of the day.  States she is going to discuss with pt about possibly doing palliative care rather than start Enhertu.  States she will alert our office of pt decision.

## 2023-11-05 NOTE — Progress Notes (Signed)
 Pharmacist Chemotherapy Monitoring - Initial Assessment    Anticipated start date: 11/12/23   The following has been reviewed per standard work regarding the patient's treatment regimen: The patient's diagnosis, treatment plan and drug doses, and organ/hematologic function Lab orders and baseline tests specific to treatment regimen  The treatment plan start date, drug sequencing, and pre-medications Prior authorization status  Patient's documented medication list, including drug-drug interaction screen and prescriptions for anti-emetics and supportive care specific to the treatment regimen The drug concentrations, fluid compatibility, administration routes, and timing of the medications to be used The patient's access for treatment and lifetime cumulative dose history, if applicable  The patient's medication allergies and previous infusion related reactions, if applicable   Changes made to treatment plan:  N/A  Follow up needed:  Pending authorization for treatment    Ebony Hail, Pharm.D., CPP 11/05/2023@2 :01 PM

## 2023-11-06 ENCOUNTER — Other Ambulatory Visit: Payer: Self-pay | Admitting: Pharmacist

## 2023-11-06 DIAGNOSIS — C7800 Secondary malignant neoplasm of unspecified lung: Secondary | ICD-10-CM | POA: Diagnosis not present

## 2023-11-06 DIAGNOSIS — E1159 Type 2 diabetes mellitus with other circulatory complications: Secondary | ICD-10-CM | POA: Diagnosis not present

## 2023-11-06 DIAGNOSIS — D630 Anemia in neoplastic disease: Secondary | ICD-10-CM | POA: Diagnosis not present

## 2023-11-06 DIAGNOSIS — C50912 Malignant neoplasm of unspecified site of left female breast: Secondary | ICD-10-CM | POA: Diagnosis not present

## 2023-11-06 DIAGNOSIS — C787 Secondary malignant neoplasm of liver and intrahepatic bile duct: Secondary | ICD-10-CM | POA: Diagnosis not present

## 2023-11-06 DIAGNOSIS — G9341 Metabolic encephalopathy: Secondary | ICD-10-CM | POA: Diagnosis not present

## 2023-11-06 DIAGNOSIS — I152 Hypertension secondary to endocrine disorders: Secondary | ICD-10-CM | POA: Diagnosis not present

## 2023-11-06 DIAGNOSIS — C50919 Malignant neoplasm of unspecified site of unspecified female breast: Secondary | ICD-10-CM

## 2023-11-06 DIAGNOSIS — E1122 Type 2 diabetes mellitus with diabetic chronic kidney disease: Secondary | ICD-10-CM | POA: Diagnosis not present

## 2023-11-06 DIAGNOSIS — N179 Acute kidney failure, unspecified: Secondary | ICD-10-CM | POA: Diagnosis not present

## 2023-11-06 NOTE — Progress Notes (Unsigned)
 [facility]       Telephone: [phone]?Fax: [phone]   Oncology Clinical Pharmacist Practitioner Initial Assessment  ANIELLA Rogers is a 78 y.o. female with a diagnosis of breast cancer. They were contacted today via {JAIVISITTYPE:26375::"in-person"} visit.  Indication/Regimen Fam-trastuzumab deruxtecan-nxki  (Enhertu) is being used appropriately for treatment of breast cancer by Dr. Lavonda Jumbo.       Wt Readings from Last 1 Encounters:  10/28/23 157 lb 6.4 oz (71.4 kg)    Estimated body surface area is 1.75 meters squared as calculated from the following:   Height as of 10/28/23: 5\' 1"  (1.549 m).   Weight as of 10/28/23: 157 lb 6.4 oz (71.4 kg).  The dosing regimen is every 21 days until disease progression or unacceptable toxicity  Fam-trastuzumab deruxtecan-nxki (5.4 mg/kg) on Day 1  Dose Modifications ***   Allergies Allergies  Allergen Reactions   Sulfa Antibiotics Itching    Vitals    10/28/2023   11:06 AM 10/23/2023   10:59 AM 10/06/2023   12:04 PM  Oncology Vitals  Height 155 cm    Weight 71.396 kg 74.39 kg   Weight (lbs) 157 lbs 6 oz 164 lbs   BMI 29.74 kg/m2 30.99 kg/m2   Temp 97.8 F (36.6 C) 98.2 F (36.8 C) 97.8 F (36.6 C)  Pulse Rate 76 78 73  BP 125/45 132/65 135/66  Resp 18 24 18   SpO2 94 % 96 % 98 %  BSA (m2) 1.75 m2 1.79 m2     Laboratory Data    Latest Ref Rng & Units 10/06/2023   11:19 AM 10/05/2023    5:48 AM 10/04/2023    4:50 AM  CBC EXTENDED  WBC 4.0 - 10.5 K/uL 10.0  11.6  14.0   RBC 3.87 - 5.11 MIL/uL 3.61  3.63  4.61   Hemoglobin 12.0 - 15.0 g/dL 29.5  62.1  30.8   HCT 36.0 - 46.0 % 29.9  29.9  37.4   Platelets 150 - 400 K/uL 327  299  295   NEUT# 1.7 - 7.7 K/uL 7.5  8.7    Lymph# 0.7 - 4.0 K/uL 1.1  1.2         Latest Ref Rng & Units 10/06/2023   11:19 AM 10/05/2023    5:48 AM 10/04/2023    4:50 AM  CMP  Glucose 70 - 99 mg/dL 657  846  962   BUN 8 - 23 mg/dL 24  37  45   Creatinine 0.44 -  1.00 mg/dL 9.52  8.41  3.24   Sodium 135 - 145 mmol/L 143  142  143   Potassium 3.5 - 5.1 mmol/L 3.6  3.9  4.1   Chloride 98 - 111 mmol/L 115  118  116   CO2 22 - 32 mmol/L 19  17  16    Calcium 8.9 - 10.3 mg/dL 8.5  8.5  8.3   Total Protein 6.5 - 8.1 g/dL 5.8  5.4  6.0   Total Bilirubin 0.0 - 1.2 mg/dL 0.6  0.9  0.9   Alkaline Phos 38 - 126 U/L 127  121  149   AST 15 - 41 U/L 98  75  78   ALT 0 - 44 U/L 68  54  55    Lab Results  Component Value Date   MG 1.9 10/06/2023   MG 2.2 10/05/2023   MG 2.2 10/04/2023   No results found for: "CA2729"   Contraindications Contraindications were  reviewed? {yes/no:20286} Contraindications to therapy were identified? {YES/NO:21197}  Safety Precautions The following safety precautions for the use of fam-trastuzumab deruxtecan-nxki were reviewed:  Fever/neutropenia: reviewed the importance of having a thermometer and the Centers for Disease Control and Prevention (CDC) definition of fever which is 100.37F (38C) or higher. Patient should call 24/7 triage at [phone] if experiencing a fever or any other symptoms Decreased hemoglobin Decreased platelet count Nausea / Vomiting Liver function test elevations Fatigue Alopecia Electrolyte abnormalities Constipation Diarrhea Pain Decreased appetite Interstitial Lung Disease (ILD): reviewed possible symptoms which include cough, shortness of breast, or fever Left Ventricular Dysfunction Handling body fluids and waste Intimacy, sexual activity, contraception, and fertility ***  Medication Reconciliation Current Outpatient Medications  Medication Sig Dispense Refill   Accu-Chek FastClix Lancets MISC USE TO CHECK FASTING BLOOD SUGAR AND BEFORE MEALS AND AGAIN IF PATIENT FEELS BAD (SYMPTOMS OF HYPO) AS DIRECTED BY YOUR PRESCRIBER 102 each 11   allopurinol (ZYLOPRIM) 100 MG tablet Take 1 tablet (100 mg total) by mouth daily. 90 tablet 1   amLODipine (NORVASC) 10 MG tablet Take 1 tablet (10  mg total) by mouth daily. 90 tablet 1   aspirin EC 81 MG tablet Take 81 mg by mouth in the morning. Swallow whole.     atorvastatin (LIPITOR) 80 MG tablet TAKE 1 TABLET EVERY EVENING 90 tablet 3   Blood Glucose Monitoring Suppl (ACCU-CHEK GUIDE) w/Device KIT 1 each by Does not apply route as directed. 1 kit 0   carvedilol (COREG) 25 MG tablet Take 1 tablet (25 mg total) by mouth 2 (two) times daily with a meal. 180 tablet 1   cholecalciferol (VITAMIN D3) 25 MCG (1000 UNIT) tablet Take 1,000 Units by mouth daily.     ferrous sulfate 325 (65 FE) MG EC tablet Take 1 tablet (325 mg total) by mouth in the morning and at bedtime. 60 tablet 1   furosemide (LASIX) 20 MG tablet Take 1 tablet (20 mg total) by mouth daily. For  3 to 5 days prn leg swelling 30 tablet 1   gabapentin (NEURONTIN) 100 MG capsule Take 1 capsule (100 mg total) by mouth 3 (three) times daily. 270 capsule 1   glucose blood (ACCU-CHEK GUIDE) test strip USE TO CHECK FASTING BLOOD SUGAR AND BEFORE MEALS AND AGAIN IF PATIENT FEELS BAD; SYMPTOMS OF HYPOGLYCEMIA 100 strip 11   metFORMIN (GLUCOPHAGE) 500 MG tablet Take 1 tablet (500 mg total) by mouth daily with breakfast. 90 tablet 1   Multiple Vitamin (MULTIVITAMIN WITH MINERALS) TABS tablet Take 1 tablet by mouth daily. 30 tablet 0   ondansetron (ZOFRAN) 8 MG tablet Take 1 tablet (8 mg total) by mouth every 8 (eight) hours as needed. 30 tablet 0   polyethylene glycol (MIRALAX / GLYCOLAX) 17 g packet Take 17 g by mouth daily as needed (constipation.).     No current facility-administered medications for this visit.    Medication reconciliation is based on the patient's most recent medication list in the electronic medical record (EMR) including herbal products and OTC medications.   The patient's medication list was reviewed today with the patient? {YES/NO:21197}  Drug-drug interactions (DDIs) DDIs were evaluated? {yes/no:20286} Significant DDIs identified?  {YES/NO:21197}  Drug-Food Interactions Drug-food interactions were evaluated? {yes/no:20286} Drug-food interactions identified? {YES/NO:21197}  Follow-up Plan  Treatment start date: 11/12/23 [facility] placement date: Will be given peripherally ECHO date: Ordered 11/06/23 *** Clinical pharmacy will assist Dr. Lavonda Jumbo and Dionne Ano on an as needed basis going forward  Heather Rogers  Shearon Balo participated in the discussion, expressed understanding, and voiced agreement with the above plan. All questions were answered to her satisfaction. The patient was advised to contact the clinic at [phone] with any questions or concerns prior to her return visit.   I spent *** minutes assessing the patient.  Eon Zunker A. Odetta Pink, PharmD, BCOP, CPP  Anselm Lis, RPH-CPP, 11/06/2023 10:44 AM  **Disclaimer: This note was dictated with voice recognition software. Similar sounding words can inadvertently be transcribed and this note may contain transcription errors which may not have been corrected upon publication of note.**

## 2023-11-07 ENCOUNTER — Encounter: Payer: Self-pay | Admitting: *Deleted

## 2023-11-07 ENCOUNTER — Ambulatory Visit: Admitting: Pharmacist

## 2023-11-07 ENCOUNTER — Other Ambulatory Visit: Payer: Self-pay | Admitting: Hematology and Oncology

## 2023-11-07 ENCOUNTER — Emergency Department (HOSPITAL_COMMUNITY)

## 2023-11-07 ENCOUNTER — Other Ambulatory Visit: Payer: Self-pay

## 2023-11-07 ENCOUNTER — Encounter (HOSPITAL_COMMUNITY): Payer: Self-pay

## 2023-11-07 ENCOUNTER — Inpatient Hospital Stay (HOSPITAL_COMMUNITY)
Admission: EM | Admit: 2023-11-07 | Discharge: 2023-11-21 | DRG: 435 | Disposition: A | Discharge status: Hospice | Attending: Family Medicine | Admitting: Family Medicine

## 2023-11-07 ENCOUNTER — Inpatient Hospital Stay: Attending: Hematology and Oncology | Admitting: Pharmacist

## 2023-11-07 ENCOUNTER — Inpatient Hospital Stay

## 2023-11-07 VITALS — BP 142/47 | HR 75 | Resp 22

## 2023-11-07 DIAGNOSIS — Z882 Allergy status to sulfonamides status: Secondary | ICD-10-CM

## 2023-11-07 DIAGNOSIS — R0609 Other forms of dyspnea: Principal | ICD-10-CM | POA: Diagnosis present

## 2023-11-07 DIAGNOSIS — C787 Secondary malignant neoplasm of liver and intrahepatic bile duct: Principal | ICD-10-CM | POA: Diagnosis present

## 2023-11-07 DIAGNOSIS — C50919 Malignant neoplasm of unspecified site of unspecified female breast: Secondary | ICD-10-CM | POA: Diagnosis not present

## 2023-11-07 DIAGNOSIS — R5383 Other fatigue: Secondary | ICD-10-CM | POA: Diagnosis not present

## 2023-11-07 DIAGNOSIS — I129 Hypertensive chronic kidney disease with stage 1 through stage 4 chronic kidney disease, or unspecified chronic kidney disease: Secondary | ICD-10-CM

## 2023-11-07 DIAGNOSIS — N1832 Chronic kidney disease, stage 3b: Secondary | ICD-10-CM | POA: Diagnosis not present

## 2023-11-07 DIAGNOSIS — E119 Type 2 diabetes mellitus without complications: Secondary | ICD-10-CM | POA: Diagnosis not present

## 2023-11-07 DIAGNOSIS — C50912 Malignant neoplasm of unspecified site of left female breast: Secondary | ICD-10-CM | POA: Diagnosis present

## 2023-11-07 DIAGNOSIS — Z90710 Acquired absence of both cervix and uterus: Secondary | ICD-10-CM

## 2023-11-07 DIAGNOSIS — R180 Malignant ascites: Secondary | ICD-10-CM | POA: Diagnosis not present

## 2023-11-07 DIAGNOSIS — R188 Other ascites: Secondary | ICD-10-CM | POA: Diagnosis not present

## 2023-11-07 DIAGNOSIS — Z8616 Personal history of COVID-19: Secondary | ICD-10-CM | POA: Diagnosis not present

## 2023-11-07 DIAGNOSIS — Z7982 Long term (current) use of aspirin: Secondary | ICD-10-CM | POA: Diagnosis not present

## 2023-11-07 DIAGNOSIS — R918 Other nonspecific abnormal finding of lung field: Secondary | ICD-10-CM | POA: Diagnosis not present

## 2023-11-07 DIAGNOSIS — E611 Iron deficiency: Secondary | ICD-10-CM | POA: Diagnosis not present

## 2023-11-07 DIAGNOSIS — R627 Adult failure to thrive: Secondary | ICD-10-CM | POA: Diagnosis not present

## 2023-11-07 DIAGNOSIS — Z8249 Family history of ischemic heart disease and other diseases of the circulatory system: Secondary | ICD-10-CM

## 2023-11-07 DIAGNOSIS — Z66 Do not resuscitate: Secondary | ICD-10-CM | POA: Diagnosis not present

## 2023-11-07 DIAGNOSIS — Z7189 Other specified counseling: Secondary | ICD-10-CM | POA: Diagnosis not present

## 2023-11-07 DIAGNOSIS — R4182 Altered mental status, unspecified: Secondary | ICD-10-CM | POA: Diagnosis not present

## 2023-11-07 DIAGNOSIS — Z7401 Bed confinement status: Secondary | ICD-10-CM | POA: Diagnosis not present

## 2023-11-07 DIAGNOSIS — Z823 Family history of stroke: Secondary | ICD-10-CM

## 2023-11-07 DIAGNOSIS — Z79899 Other long term (current) drug therapy: Secondary | ICD-10-CM | POA: Diagnosis not present

## 2023-11-07 DIAGNOSIS — E1122 Type 2 diabetes mellitus with diabetic chronic kidney disease: Secondary | ICD-10-CM

## 2023-11-07 DIAGNOSIS — Z515 Encounter for palliative care: Secondary | ICD-10-CM | POA: Diagnosis not present

## 2023-11-07 DIAGNOSIS — R410 Disorientation, unspecified: Secondary | ICD-10-CM | POA: Diagnosis not present

## 2023-11-07 DIAGNOSIS — G9341 Metabolic encephalopathy: Secondary | ICD-10-CM | POA: Diagnosis not present

## 2023-11-07 DIAGNOSIS — Z87891 Personal history of nicotine dependence: Secondary | ICD-10-CM

## 2023-11-07 DIAGNOSIS — Z8673 Personal history of transient ischemic attack (TIA), and cerebral infarction without residual deficits: Secondary | ICD-10-CM

## 2023-11-07 DIAGNOSIS — N6320 Unspecified lump in the left breast, unspecified quadrant: Secondary | ICD-10-CM | POA: Diagnosis not present

## 2023-11-07 DIAGNOSIS — R4589 Other symptoms and signs involving emotional state: Secondary | ICD-10-CM | POA: Diagnosis not present

## 2023-11-07 DIAGNOSIS — R630 Anorexia: Secondary | ICD-10-CM | POA: Diagnosis not present

## 2023-11-07 DIAGNOSIS — C50911 Malignant neoplasm of unspecified site of right female breast: Secondary | ICD-10-CM | POA: Diagnosis not present

## 2023-11-07 DIAGNOSIS — R1110 Vomiting, unspecified: Secondary | ICD-10-CM | POA: Diagnosis not present

## 2023-11-07 DIAGNOSIS — R748 Abnormal levels of other serum enzymes: Secondary | ICD-10-CM | POA: Diagnosis present

## 2023-11-07 DIAGNOSIS — J9811 Atelectasis: Secondary | ICD-10-CM | POA: Diagnosis not present

## 2023-11-07 DIAGNOSIS — E118 Type 2 diabetes mellitus with unspecified complications: Secondary | ICD-10-CM | POA: Diagnosis not present

## 2023-11-07 DIAGNOSIS — C7981 Secondary malignant neoplasm of breast: Secondary | ICD-10-CM | POA: Diagnosis not present

## 2023-11-07 DIAGNOSIS — N179 Acute kidney failure, unspecified: Secondary | ICD-10-CM | POA: Diagnosis not present

## 2023-11-07 DIAGNOSIS — C220 Liver cell carcinoma: Secondary | ICD-10-CM | POA: Diagnosis not present

## 2023-11-07 DIAGNOSIS — R5381 Other malaise: Secondary | ICD-10-CM | POA: Diagnosis present

## 2023-11-07 DIAGNOSIS — R531 Weakness: Secondary | ICD-10-CM | POA: Diagnosis not present

## 2023-11-07 DIAGNOSIS — J90 Pleural effusion, not elsewhere classified: Secondary | ICD-10-CM | POA: Diagnosis not present

## 2023-11-07 DIAGNOSIS — R0602 Shortness of breath: Secondary | ICD-10-CM | POA: Diagnosis not present

## 2023-11-07 DIAGNOSIS — I959 Hypotension, unspecified: Secondary | ICD-10-CM | POA: Diagnosis not present

## 2023-11-07 DIAGNOSIS — J690 Pneumonitis due to inhalation of food and vomit: Secondary | ICD-10-CM | POA: Diagnosis not present

## 2023-11-07 DIAGNOSIS — R0989 Other specified symptoms and signs involving the circulatory and respiratory systems: Secondary | ICD-10-CM | POA: Diagnosis not present

## 2023-11-07 DIAGNOSIS — I6782 Cerebral ischemia: Secondary | ICD-10-CM | POA: Diagnosis not present

## 2023-11-07 DIAGNOSIS — I10 Essential (primary) hypertension: Secondary | ICD-10-CM | POA: Diagnosis not present

## 2023-11-07 DIAGNOSIS — Z7984 Long term (current) use of oral hypoglycemic drugs: Secondary | ICD-10-CM | POA: Diagnosis not present

## 2023-11-07 DIAGNOSIS — R509 Fever, unspecified: Secondary | ICD-10-CM | POA: Diagnosis not present

## 2023-11-07 DIAGNOSIS — D509 Iron deficiency anemia, unspecified: Secondary | ICD-10-CM | POA: Diagnosis present

## 2023-11-07 LAB — COMPREHENSIVE METABOLIC PANEL WITH GFR
ALT: 85 U/L — ABNORMAL HIGH (ref 0–44)
AST: 203 U/L — ABNORMAL HIGH (ref 15–41)
Albumin: 2.8 g/dL — ABNORMAL LOW (ref 3.5–5.0)
Alkaline Phosphatase: 239 U/L — ABNORMAL HIGH (ref 38–126)
Anion gap: 8 (ref 5–15)
BUN: 27 mg/dL — ABNORMAL HIGH (ref 8–23)
CO2: 22 mmol/L (ref 22–32)
Calcium: 11.5 mg/dL — ABNORMAL HIGH (ref 8.9–10.3)
Chloride: 103 mmol/L (ref 98–111)
Creatinine, Ser: 0.94 mg/dL (ref 0.44–1.00)
GFR, Estimated: >60 mL/min (ref >60)
Glucose, Bld: 161 mg/dL — ABNORMAL HIGH (ref 70–99)
Potassium: 4.6 mmol/L (ref 3.5–5.1)
Sodium: 133 mmol/L — ABNORMAL LOW (ref 135–145)
Total Bilirubin: 1.2 mg/dL (ref 0.0–1.2)
Total Protein: 7.2 g/dL (ref 6.5–8.1)

## 2023-11-07 LAB — CBC WITH DIFFERENTIAL/PLATELET
Abs Immature Granulocytes: 0.06 10*3/uL (ref 0.00–0.07)
Basophils Absolute: 0 10*3/uL (ref 0.0–0.1)
Basophils Relative: 0 %
Eosinophils Absolute: 0 10*3/uL (ref 0.0–0.5)
Eosinophils Relative: 0 %
HCT: 28.2 % — ABNORMAL LOW (ref 36.0–46.0)
Hemoglobin: 9.1 g/dL — ABNORMAL LOW (ref 12.0–15.0)
Immature Granulocytes: 1 %
Lymphocytes Relative: 9 %
Lymphs Abs: 1.1 10*3/uL (ref 0.7–4.0)
MCH: 25.9 pg — ABNORMAL LOW (ref 26.0–34.0)
MCHC: 32.3 g/dL (ref 30.0–36.0)
MCV: 80.3 fL (ref 80.0–100.0)
Monocytes Absolute: 2 10*3/uL — ABNORMAL HIGH (ref 0.1–1.0)
Monocytes Relative: 15 %
Neutro Abs: 9.7 10*3/uL — ABNORMAL HIGH (ref 1.7–7.7)
Neutrophils Relative %: 75 %
Platelets: 300 10*3/uL (ref 150–400)
RBC: 3.51 MIL/uL — ABNORMAL LOW (ref 3.87–5.11)
RDW: 16.7 % — ABNORMAL HIGH (ref 11.5–15.5)
WBC: 12.9 10*3/uL — ABNORMAL HIGH (ref 4.0–10.5)
nRBC: 0 % (ref 0.0–0.2)

## 2023-11-07 LAB — BRAIN NATRIURETIC PEPTIDE: B Natriuretic Peptide: 146.9 pg/mL — ABNORMAL HIGH (ref 0.0–100.0)

## 2023-11-07 LAB — GLUCOSE, CAPILLARY
Glucose-Capillary: 135 mg/dL — ABNORMAL HIGH (ref 70–99)
Glucose-Capillary: 93 mg/dL (ref 70–99)

## 2023-11-07 MED ORDER — INSULIN ASPART 100 UNIT/ML IJ SOLN
0.0000 [IU] | Freq: Every day | INTRAMUSCULAR | Status: DC
  Administered 2023-11-09: 2 [IU] via SUBCUTANEOUS
  Filled 2023-11-07: qty 0.05

## 2023-11-07 MED ORDER — ALBUTEROL SULFATE (2.5 MG/3ML) 0.083% IN NEBU: 2.5000 mg | INHALATION_SOLUTION | RESPIRATORY_TRACT | Status: DC | PRN

## 2023-11-07 MED ORDER — ENOXAPARIN SODIUM 40 MG/0.4ML IJ SOSY
40.0000 mg | PREFILLED_SYRINGE | INTRAMUSCULAR | Status: DC
  Administered 2023-11-07 – 2023-11-20 (×12): 40 mg via SUBCUTANEOUS
  Filled 2023-11-07 (×13): qty 0.4

## 2023-11-07 MED ORDER — CARVEDILOL 25 MG PO TABS
25.0000 mg | ORAL_TABLET | Freq: Two times a day (BID) | ORAL | Status: DC
  Administered 2023-11-07 – 2023-11-21 (×21): 25 mg via ORAL
  Filled 2023-11-07 (×22): qty 1

## 2023-11-07 MED ORDER — AMLODIPINE BESYLATE 10 MG PO TABS
10.0000 mg | ORAL_TABLET | Freq: Every day | ORAL | Status: DC
  Administered 2023-11-07 – 2023-11-20 (×12): 10 mg via ORAL
  Filled 2023-11-07 (×13): qty 1

## 2023-11-07 MED ORDER — ACETAMINOPHEN 650 MG RE SUPP
650.0000 mg | Freq: Four times a day (QID) | RECTAL | Status: DC | PRN
  Administered 2023-11-08 – 2023-11-14 (×6): 650 mg via RECTAL
  Filled 2023-11-07 (×7): qty 1

## 2023-11-07 MED ORDER — OXYCODONE HCL 5 MG PO TABS: 5.0000 mg | ORAL_TABLET | ORAL | Status: DC | PRN

## 2023-11-07 MED ORDER — INSULIN ASPART 100 UNIT/ML IJ SOLN
0.0000 [IU] | Freq: Three times a day (TID) | INTRAMUSCULAR | Status: DC
  Administered 2023-11-07 – 2023-11-13 (×2): 2 [IU] via SUBCUTANEOUS
  Administered 2023-11-14: 3 [IU] via SUBCUTANEOUS
  Administered 2023-11-14 (×2): 2 [IU] via SUBCUTANEOUS
  Administered 2023-11-15 (×2): 3 [IU] via SUBCUTANEOUS
  Administered 2023-11-15: 2 [IU] via SUBCUTANEOUS
  Administered 2023-11-16: 5 [IU] via SUBCUTANEOUS
  Administered 2023-11-16 – 2023-11-17 (×4): 3 [IU] via SUBCUTANEOUS
  Administered 2023-11-18 (×2): 2 [IU] via SUBCUTANEOUS
  Administered 2023-11-18: 3 [IU] via SUBCUTANEOUS
  Administered 2023-11-19 (×2): 2 [IU] via SUBCUTANEOUS
  Filled 2023-11-07: qty 0.15

## 2023-11-07 MED ORDER — ONDANSETRON HCL 4 MG/2ML IJ SOLN
4.0000 mg | Freq: Four times a day (QID) | INTRAMUSCULAR | Status: DC | PRN
Start: 2023-11-07 — End: 2023-11-21
  Administered 2023-11-09 – 2023-11-21 (×4): 4 mg via INTRAVENOUS
  Filled 2023-11-07 (×4): qty 2

## 2023-11-07 MED ORDER — IOHEXOL 300 MG/ML  SOLN
100.0000 mL | Freq: Once | INTRAMUSCULAR | Status: DC | PRN
Start: 2023-11-07 — End: 2023-11-07

## 2023-11-07 MED ORDER — GABAPENTIN 100 MG PO CAPS
100.0000 mg | ORAL_CAPSULE | Freq: Three times a day (TID) | ORAL | Status: DC
  Administered 2023-11-07 – 2023-11-13 (×9): 100 mg via ORAL
  Filled 2023-11-07 (×11): qty 1

## 2023-11-07 MED ORDER — SODIUM CHLORIDE (PF) 0.9 % IJ SOLN
INTRAMUSCULAR | Status: AC
  Filled 2023-11-07: qty 50

## 2023-11-07 MED ORDER — FENTANYL CITRATE PF 50 MCG/ML IJ SOSY: 12.5000 ug | PREFILLED_SYRINGE | INTRAMUSCULAR | Status: DC | PRN

## 2023-11-07 MED ORDER — ACETAMINOPHEN 325 MG PO TABS
650.0000 mg | ORAL_TABLET | Freq: Four times a day (QID) | ORAL | Status: DC | PRN
  Administered 2023-11-07 – 2023-11-18 (×4): 650 mg via ORAL
  Filled 2023-11-07 (×5): qty 2

## 2023-11-07 MED ORDER — ASPIRIN 81 MG PO TBEC
81.0000 mg | DELAYED_RELEASE_TABLET | Freq: Every day | ORAL | Status: DC
  Administered 2023-11-08 – 2023-11-21 (×12): 81 mg via ORAL
  Filled 2023-11-07 (×12): qty 1

## 2023-11-07 MED ORDER — TRAZODONE HCL 50 MG PO TABS: 25.0000 mg | ORAL_TABLET | Freq: Every evening | ORAL | Status: DC | PRN

## 2023-11-07 MED ORDER — FERROUS SULFATE 325 (65 FE) MG PO TABS
325.0000 mg | ORAL_TABLET | Freq: Every day | ORAL | Status: DC
  Administered 2023-11-08 – 2023-11-21 (×12): 325 mg via ORAL
  Filled 2023-11-07 (×12): qty 1

## 2023-11-07 MED ORDER — IOHEXOL 350 MG/ML SOLN
100.0000 mL | Freq: Once | INTRAVENOUS | Status: AC | PRN
  Administered 2023-11-07: 100 mL via INTRAVENOUS

## 2023-11-07 MED ORDER — ONDANSETRON HCL 4 MG PO TABS: 4.0000 mg | ORAL_TABLET | Freq: Four times a day (QID) | ORAL | Status: DC | PRN

## 2023-11-07 NOTE — Progress Notes (Signed)
 Pt transported to ED room 21 for evaluation of distended abdomen and shortness of breath with metastatic breast cancer to the liver.  Report given and pt transported with all belongings.

## 2023-11-07 NOTE — ED Provider Notes (Signed)
 [employer] EMERGENCY DEPARTMENT AT [facility] Provider Note   CSN: [id number] Arrival date & time: 11/07/23  0944     History  Chief Complaint  Patient presents with   Shortness of Breath    Heather Rogers is a 78 y.o. female.  Patient is a 78 year old female with past medical history of metastatic breast cancer, diabetes, hypertension presenting to the emergency department shortness of breath.  Patient is here with her daughter who states that she picked her up this morning to take her to her chemo education appointment and noticed that she was significantly short of breath.  She states that she cannot walk to the bathroom or get dressed without having to sit down to catch her breath.  The patient states that she has no associated chest pain or abdominal pain.  Has had abdominal distention since her last hospitalization.  She states that she has chronic nausea that is no worse than her usual.  Denies any dysuria or hematuria, diarrhea or constipation.  She was trialed on Lasix by her primary doctor without any improvement and so this was stopped. She denies fever or cough.  The history is provided by the patient and a relative.  Shortness of Breath      Home Medications Prior to Admission medications   Medication Sig Start Date End Date Taking? Authorizing Provider  Accu-Chek FastClix Lancets MISC USE TO CHECK FASTING BLOOD SUGAR AND BEFORE MEALS AND AGAIN IF PATIENT FEELS BAD (SYMPTOMS OF HYPO) AS DIRECTED BY YOUR PRESCRIBER 10/12/22   [name], MD  allopurinol (ZYLOPRIM) 100 MG tablet Take 1 tablet (100 mg total) by mouth daily. 02/19/23   [name], MD  amLODipine (NORVASC) 10 MG tablet Take 1 tablet (10 mg total) by mouth daily. 02/19/23   [name], MD  aspirin EC 81 MG tablet Take 81 mg by mouth in the morning. Swallow whole.    [provider]  atorvastatin (LIPITOR) 80 MG tablet TAKE 1 TABLET EVERY EVENING 04/22/23   [name], MD   Blood Glucose Monitoring Suppl (ACCU-CHEK GUIDE) w/Device KIT 1 each by Does not apply route as directed. 08/25/18   Fulp, Cammie, MD  carvedilol (COREG) 25 MG tablet Take 1 tablet (25 mg total) by mouth 2 (two) times daily with a meal. 02/19/23   [name], MD  cholecalciferol (VITAMIN D3) 25 MCG (1000 UNIT) tablet Take 1,000 Units by mouth daily.    [provider]  ferrous sulfate 325 (65 FE) MG EC tablet Take 1 tablet (325 mg total) by mouth in the morning and at bedtime. 05/22/21   Mayers, Cari S, PA-C  furosemide (LASIX) 20 MG tablet Take 1 tablet (20 mg total) by mouth daily. For  3 to 5 days prn leg swelling 10/23/23   Georgian Co M, PA-C  gabapentin (NEURONTIN) 100 MG capsule Take 1 capsule (100 mg total) by mouth 3 (three) times daily. 02/19/23   [name], MD  glucose blood (ACCU-CHEK GUIDE) test strip USE TO CHECK FASTING BLOOD SUGAR AND BEFORE MEALS AND AGAIN IF PATIENT FEELS BAD; SYMPTOMS OF HYPOGLYCEMIA 09/20/21   [name], MD  metFORMIN (GLUCOPHAGE) 500 MG tablet Take 1 tablet (500 mg total) by mouth daily with breakfast. 08/22/23   [name], MD  Multiple Vitamin (MULTIVITAMIN WITH MINERALS) TABS tablet Take 1 tablet by mouth daily. 09/09/21   Ghimire, Werner Lean, MD  ondansetron (ZOFRAN) 8 MG tablet Take 1 tablet (8 mg total) by mouth every 8 (eight)  hours as needed. 10/06/23   Shalhoub, Deno Lunger, MD  polyethylene glycol (MIRALAX / GLYCOLAX) 17 g packet Take 17 g by mouth daily as needed (constipation.).    Serena Croissant, MD      Allergies    Sulfa antibiotics    Review of Systems   Review of Systems  Respiratory:  Positive for shortness of breath.     Physical Exam Updated Vital Signs BP (!) 141/60   Pulse 81   Temp 99.9 F (37.7 C) (Oral)   Resp 18   Ht 5\' 2"  (1.575 m)   Wt 70.8 kg   SpO2 93%   BMI 28.53 kg/m  Physical Exam Vitals and nursing note reviewed.  Constitutional:      General: She is not in acute distress.     Appearance: She is well-developed. She is ill-appearing (chronically).  HENT:     Head: Normocephalic.     Mouth/Throat:     Mouth: Mucous membranes are moist.     Pharynx: Oropharynx is clear.  Eyes:     Extraocular Movements: Extraocular movements intact.  Cardiovascular:     Rate and Rhythm: Normal rate and regular rhythm.     Heart sounds: Normal heart sounds.  Pulmonary:     Effort: Pulmonary effort is normal.     Comments: Diminished breath sounds at the bases Abdominal:     Palpations: Abdomen is soft.     Comments: Distended abdomen with palpable fluid wave, no tenderness to palpation  Musculoskeletal:        General: Normal range of motion.     Cervical back: Normal range of motion and neck supple.     Right lower leg: No edema.     Left lower leg: No edema.  Skin:    General: Skin is warm and dry.  Neurological:     General: No focal deficit present.     Mental Status: She is alert and oriented to person, place, and time.  Psychiatric:        Mood and Affect: Mood normal.        Behavior: Behavior normal.     ED Results / Procedures / Treatments   Labs (all labs ordered are listed, but only abnormal results are displayed) Labs Reviewed  BRAIN NATRIURETIC PEPTIDE - Abnormal; Notable for the following components:      Result Value   B Natriuretic Peptide 146.9 (*)    All other components within normal limits  COMPREHENSIVE METABOLIC PANEL WITH GFR - Abnormal; Notable for the following components:   Sodium 133 (*)    Glucose, Bld 161 (*)    BUN 27 (*)    Calcium 11.5 (*)    Albumin 2.8 (*)    AST 203 (*)    ALT 85 (*)    Alkaline Phosphatase 239 (*)    All other components within normal limits  CBC WITH DIFFERENTIAL/PLATELET - Abnormal; Notable for the following components:   WBC 12.9 (*)    RBC 3.51 (*)    Hemoglobin 9.1 (*)    HCT 28.2 (*)    MCH 25.9 (*)    RDW 16.7 (*)    Neutro Abs 9.7 (*)    Monocytes Absolute 2.0 (*)    All other components  within normal limits  URINALYSIS, W/ REFLEX TO CULTURE (INFECTION SUSPECTED)    EKG EKG Interpretation Date/Time:  Thursday November 07 2023 09:52:07 EDT Ventricular Rate:  74 PR Interval:  180 QRS Duration:  85 QT  Interval:  343 QTC Calculation: 383 R Axis:   84  Text Interpretation: Sinus rhythm Borderline right axis deviation Low voltage, precordial leads Anteroseptal infarct, old No significant change since last tracing Confirmed by Elayne Snare ([id number]) on 11/07/2023 11:57:40 AM  Radiology CT ABDOMEN PELVIS W CONTRAST Result Date: 11/07/2023 CLINICAL DATA:  Abdominal pain, acute, nonlocalized; Pulmonary embolism (PE) suspected, high prob EXAM: CT ANGIOGRAPHY CHEST CT ABDOMEN AND PELVIS WITH CONTRAST TECHNIQUE: Multidetector CT imaging of the chest was performed using the standard protocol during bolus administration of intravenous contrast. Multiplanar CT image reconstructions and MIPs were obtained to evaluate the vascular anatomy. Multidetector CT imaging of the abdomen and pelvis was performed using the standard protocol during bolus administration of intravenous contrast. RADIATION DOSE REDUCTION: This exam was performed according to the departmental dose-optimization program which includes automated exposure control, adjustment of the mA and/or kV according to patient size and/or use of iterative reconstruction technique. CONTRAST:  <See Chart> OMNIPAQUE IOHEXOL 300 MG/ML SOLN, OMNIPAQUE IOHEXOL 350 MG/ML SOLN COMPARISON:  CT scan abdomen and pelvis from 09/27/2023 and CT chest from 09/29/2023. FINDINGS: CTA CHEST FINDINGS Cardiovascular: No evidence of embolism to the proximal subsegmental pulmonary artery level. Normal cardiac size. No pericardial effusion. No aortic aneurysm. There are coronary artery calcifications, in keeping with coronary artery disease. There are also mild peripheral atherosclerotic vascular calcifications of thoracic aorta and its major branches.  Mediastinum/Nodes: Visualized thyroid gland appears grossly unremarkable. No solid / cystic mediastinal masses. The esophagus is nondistended precluding optimal assessment. There is mild circumferential thickening of the lower thoracic esophagus, which is most likely seen in the settings of chronic gastroesophageal reflux disease versus esophagitis. No axillary, mediastinal or hilar lymphadenopathy by size criteria. Lungs/Pleura: The central tracheo-bronchial tree is patent. There are patchy areas of linear, plate-like atelectasis and/or scarring throughout bilateral lungs. There is trace bilateral pleural effusion with associated compressive atelectatic changes in the bilateral lower lobes. There is a heterogeneous peripherally hyperattenuating and centrally hypoattenuating approximately 1.1 x 2.0 cm pleural based nodule in the left lung lower lobe, indeterminate, but unchanged since the prior study from 09/20/2023. Attention on follow-up examination is recommended. Bilateral lungs are otherwise clear. Musculoskeletal: Redemonstration of central left breast mass as well as asymmetric left skin breast skin thickening, grossly similar to the prior study. The visualized soft tissues of the chest wall are otherwise grossly unremarkable. Redemonstration of heterogeneous mixed sclerotic/lytic appearance of the sternal body along with pathological fracture, similar to the prior study. There are mild multilevel degenerative changes in the visualized spine. Review of the MIP images confirms the above findings. CT ABDOMEN and PELVIS FINDINGS Hepatobiliary: The liver is normal in size. Non-cirrhotic configuration. Redemonstration of extensive ill-defined heterogeneous hypoattenuating liver masses occupying approximately 50% of the total liver parenchyma, compatible with metastasis. Comparison with prior study is limited due to lack of intravenous contrast however, overall, there is no significant interval change. The hepatic  vein and portal vein tributaries appear compressed however, no discrete thrombosis noted. No intrahepatic or extrahepatic bile duct dilation. Contracted gallbladder. No calcified gallstones. Normal gallbladder wall thickness. No pericholecystic inflammatory changes. Pancreas: Unremarkable. No pancreatic ductal dilatation or surrounding inflammatory changes. Spleen: Within normal limits. No focal lesion. Adrenals/Urinary Tract: Adrenal glands are unremarkable. No suspicious renal mass. Redemonstration of multiple simple cysts scattered throughout bilateral kidneys. No hydroureteronephrosis or nephroureterolithiasis. Urinary bladder is under distended, precluding optimal assessment. However, no large mass or stones identified. No perivesical fat stranding. Stomach/Bowel: No disproportionate dilation of the  small or large bowel loops. No evidence of abnormal bowel wall thickening or inflammatory changes. The appendix is unremarkable. Vascular/Lymphatic: There is small-to-moderate ascites, increased since the prior study. No pneumoperitoneum. No abdominal or pelvic lymphadenopathy, by size criteria. No aneurysmal dilation of the major abdominal arteries. There are moderate peripheral atherosclerotic vascular calcifications of the aorta and its major branches. Reproductive: The uterus is surgically absent. No large adnexal mass. There are heterogeneous soft tissue attenuation areas along the bilateral pelvic sidewalls, which may represent residual ovarian tissue versus peritoneal carcinomatosis. No significant interval change. Other: There is a tiny fat containing umbilical hernia. There is small to moderate anasarca. Musculoskeletal: No suspicious osseous lesions. There are mild multilevel degenerative changes in the visualized spine. Review of the MIP images confirms the above findings. IMPRESSION: 1. No embolism to the proximal subsegmental pulmonary artery level. 2. There is trace bilateral pleural effusion with  associated compressive atelectatic changes in the bilateral lower lobes. 3. Redemonstration of extensive liver metastases, grossly similar to the prior study. 4. There is small-to-moderate ascites, increased since the prior study. 5. Redemonstration of central left breast mass and asymmetric left breast skin thickening, grossly similar to the prior study. 6. Redemonstration of heterogeneous mixed sclerotic/lytic appearance of the sternal body along with pathological fracture, similar to the prior study. 7. Multiple other nonacute observations, as described above. Aortic Atherosclerosis (ICD10-I70.0). Electronically Signed   By: Jules Schick M.D.   On: 11/07/2023 13:15   CT Angio Chest PE W/Cm &/Or Wo Cm Result Date: 11/07/2023 CLINICAL DATA:  Abdominal pain, acute, nonlocalized; Pulmonary embolism (PE) suspected, high prob EXAM: CT ANGIOGRAPHY CHEST CT ABDOMEN AND PELVIS WITH CONTRAST TECHNIQUE: Multidetector CT imaging of the chest was performed using the standard protocol during bolus administration of intravenous contrast. Multiplanar CT image reconstructions and MIPs were obtained to evaluate the vascular anatomy. Multidetector CT imaging of the abdomen and pelvis was performed using the standard protocol during bolus administration of intravenous contrast. RADIATION DOSE REDUCTION: This exam was performed according to the departmental dose-optimization program which includes automated exposure control, adjustment of the mA and/or kV according to patient size and/or use of iterative reconstruction technique. CONTRAST:  <See Chart> OMNIPAQUE IOHEXOL 300 MG/ML SOLN, OMNIPAQUE IOHEXOL 350 MG/ML SOLN COMPARISON:  CT scan abdomen and pelvis from 09/27/2023 and CT chest from 09/29/2023. FINDINGS: CTA CHEST FINDINGS Cardiovascular: No evidence of embolism to the proximal subsegmental pulmonary artery level. Normal cardiac size. No pericardial effusion. No aortic aneurysm. There are coronary artery  calcifications, in keeping with coronary artery disease. There are also mild peripheral atherosclerotic vascular calcifications of thoracic aorta and its major branches. Mediastinum/Nodes: Visualized thyroid gland appears grossly unremarkable. No solid / cystic mediastinal masses. The esophagus is nondistended precluding optimal assessment. There is mild circumferential thickening of the lower thoracic esophagus, which is most likely seen in the settings of chronic gastroesophageal reflux disease versus esophagitis. No axillary, mediastinal or hilar lymphadenopathy by size criteria. Lungs/Pleura: The central tracheo-bronchial tree is patent. There are patchy areas of linear, plate-like atelectasis and/or scarring throughout bilateral lungs. There is trace bilateral pleural effusion with associated compressive atelectatic changes in the bilateral lower lobes. There is a heterogeneous peripherally hyperattenuating and centrally hypoattenuating approximately 1.1 x 2.0 cm pleural based nodule in the left lung lower lobe, indeterminate, but unchanged since the prior study from 09/20/2023. Attention on follow-up examination is recommended. Bilateral lungs are otherwise clear. Musculoskeletal: Redemonstration of central left breast mass as well as asymmetric left skin breast  skin thickening, grossly similar to the prior study. The visualized soft tissues of the chest wall are otherwise grossly unremarkable. Redemonstration of heterogeneous mixed sclerotic/lytic appearance of the sternal body along with pathological fracture, similar to the prior study. There are mild multilevel degenerative changes in the visualized spine. Review of the MIP images confirms the above findings. CT ABDOMEN and PELVIS FINDINGS Hepatobiliary: The liver is normal in size. Non-cirrhotic configuration. Redemonstration of extensive ill-defined heterogeneous hypoattenuating liver masses occupying approximately 50% of the total liver parenchyma,  compatible with metastasis. Comparison with prior study is limited due to lack of intravenous contrast however, overall, there is no significant interval change. The hepatic vein and portal vein tributaries appear compressed however, no discrete thrombosis noted. No intrahepatic or extrahepatic bile duct dilation. Contracted gallbladder. No calcified gallstones. Normal gallbladder wall thickness. No pericholecystic inflammatory changes. Pancreas: Unremarkable. No pancreatic ductal dilatation or surrounding inflammatory changes. Spleen: Within normal limits. No focal lesion. Adrenals/Urinary Tract: Adrenal glands are unremarkable. No suspicious renal mass. Redemonstration of multiple simple cysts scattered throughout bilateral kidneys. No hydroureteronephrosis or nephroureterolithiasis. Urinary bladder is under distended, precluding optimal assessment. However, no large mass or stones identified. No perivesical fat stranding. Stomach/Bowel: No disproportionate dilation of the small or large bowel loops. No evidence of abnormal bowel wall thickening or inflammatory changes. The appendix is unremarkable. Vascular/Lymphatic: There is small-to-moderate ascites, increased since the prior study. No pneumoperitoneum. No abdominal or pelvic lymphadenopathy, by size criteria. No aneurysmal dilation of the major abdominal arteries. There are moderate peripheral atherosclerotic vascular calcifications of the aorta and its major branches. Reproductive: The uterus is surgically absent. No large adnexal mass. There are heterogeneous soft tissue attenuation areas along the bilateral pelvic sidewalls, which may represent residual ovarian tissue versus peritoneal carcinomatosis. No significant interval change. Other: There is a tiny fat containing umbilical hernia. There is small to moderate anasarca. Musculoskeletal: No suspicious osseous lesions. There are mild multilevel degenerative changes in the visualized spine. Review of the  MIP images confirms the above findings. IMPRESSION: 1. No embolism to the proximal subsegmental pulmonary artery level. 2. There is trace bilateral pleural effusion with associated compressive atelectatic changes in the bilateral lower lobes. 3. Redemonstration of extensive liver metastases, grossly similar to the prior study. 4. There is small-to-moderate ascites, increased since the prior study. 5. Redemonstration of central left breast mass and asymmetric left breast skin thickening, grossly similar to the prior study. 6. Redemonstration of heterogeneous mixed sclerotic/lytic appearance of the sternal body along with pathological fracture, similar to the prior study. 7. Multiple other nonacute observations, as described above. Aortic Atherosclerosis (ICD10-I70.0). Electronically Signed   By: Jules Schick M.D.   On: 11/07/2023 13:15   DG Chest 2 View Result Date: 11/07/2023 CLINICAL DATA:  Shortness of breath. EXAM: CHEST - 2 VIEW COMPARISON:  09/27/2023. FINDINGS: Low lung volume. Mild diffuse pulmonary vascular congestion, likely accentuated by low lung volume. No frank pulmonary edema. Redemonstration of left retrocardiac airspace opacity obscuring the left hemidiaphragm, descending thoracic aorta and blunting the left lateral costophrenic angle, suggesting combination of left lung atelectasis and/or consolidation with pleural effusion. No significant interval change. Bilateral lung fields are otherwise clear. Right costophrenic angle is clear. Stable mildly enlarged cardio-mediastinal silhouette. Loop recorder device noted overlying the left anterior lower chest wall. No acute osseous abnormalities. The soft tissues are within normal limits. IMPRESSION: *Persistent left retrocardiac opacity, as described above. No significant interval change. Electronically Signed   By: Jules Schick M.D.   On:  11/07/2023 11:23    Procedures Procedures    Medications Ordered in ED Medications  iohexol  (OMNIPAQUE) 350 MG/ML injection 100 mL (100 mLs Intravenous Contrast Given 11/07/23 1223)    ED Course/ Medical Decision Making/ A&P Clinical Course as of 11/07/23 1425  Thu Nov 07, 2023  1151 Mildly increased liver enzymes and mild hypercalcemia compared to prior labs. [VK]  1350 No evidence of PE on CT imaging, ascites appears to have increased. Patient will require admission for her SOB likely related to the ascites. [VK]    Clinical Course User Index [VK] Rexford Maus, DO                                 Medical Decision Making This patient presents to the ED with chief complaint(s) of SOB with pertinent past medical history of metastatic breast cancer, DM, HTN which further complicates the presenting complaint. The complaint involves an extensive differential diagnosis and also carries with it a high risk of complications and morbidity.    The differential diagnosis includes ACS, arrhythmia, anemia, pneumonia, pneumothorax, pulmonary edema, pleural effusion, ascites, sepsis, considering PE with cancer history and recent hospitalization  Additional history obtained: Additional history obtained from family Records reviewed previous admission documents and outpatient oncology records  ED Course and Reassessment: On patient's arrival she is mildly tachypneic but otherwise hemodynamically stable in no acute distress.  Will have EKG, labs, chest x-ray and CT performed to evaluate for cause of her shortness of breath.  She does not have a large pleural effusion to explain the shortness of breath, likely will need PE study with recent hospitalization and cancer history as well.  She will be closely reassessed.  Independent labs interpretation:  The following labs were independently interpreted: mildly increased liver enzymes, mild hypercalcemia, leukocytosis, mild anemia stable at baseline  Independent visualization of imaging: - I independently visualized the following imaging with  scope of interpretation limited to determining acute life threatening conditions related to emergency care: CXR, CTPE, CTAP, which revealed no PE, small bilateral pleural effusions, increased ascites  Consultation: - Consulted or discussed management/test interpretation w/ external professional: hospitalist  Consideration for admission or further workup: patient requires admission for SOB likely 2/2 ascites Social Determinants of health: N/A    Amount and/or Complexity of Data Reviewed Labs: ordered. Radiology: ordered.  Risk Prescription drug management. Decision regarding hospitalization.          Final Clinical Impression(s) / ED Diagnoses Final diagnoses:  Dyspnea on exertion  Malignant ascites  Hypercalcemia    Rx / DC Orders ED Discharge Orders     None         Rexford Maus, DO 11/07/23 1425

## 2023-11-07 NOTE — ED Triage Notes (Signed)
 Patient presented to ER for ascites and shortness of breath. Patient has breast cancer with mets to liver and thyroid. Per family at bedside patient was here for chemo education (not on chemo yet) but has been increasingly short of breath, patient stomach appears swollen and tight. A&Ox4.

## 2023-11-07 NOTE — H&P (Signed)
 History and Physical  Heather Rogers BJY:[id number] DOB: [birth year] DOA: 11/07/2023  PCP: [name], MD   Chief Complaint: Shortness of breath, abdominal distention  HPI: Heather Rogers is a 78 y.o. female with medical history significant for hypertension, metastatic breast cancer, diabetes, CKD stage IIIb admitted to the hospital with symptomatic abdominal ascites.  Patient was recently hospitalized until 10/06/2023 with C. difficile colitis, during which time she was noted to have some abdominal ascites.  However over the course of the last month, she has had gradual worsening of her ascites, with shortness of breath with exertion, some intermittent mild abdominal pain.  Currently she is pain-free, she and her daughter who is at the bedside deny any fevers, nausea, vomiting.  States she has been intermittently taking diuretic since hospital discharge, and in fact her lower extremity edema is much improved from prior.  Workup for her shortness of breath and dyspnea in the emergency department today shows evidence of moderate abdominal ascites, CT scan without significant acute findings besides this.  Review of Systems: Please see HPI for pertinent positives and negatives. A complete 10 system review of systems are otherwise negative.  Past Medical History:  Diagnosis Date   Chronic kidney disease, stage 3b (HCC)    Diabetes (HCC)    Hypertension    Pneumonia due to COVID-19 virus 07/25/2020   Primary malignant neoplasm of breast with metastasis (HCC) 09/14/2021   Stroke (cerebrum) (HCC)    Stroke (HCC) 05/15/2018   Past Surgical History:  Procedure Laterality Date   CHEST TUBE INSERTION Left 09/07/2021   Procedure: CHEST TUBE INSERTION;  Surgeon: Lorin Glass, MD;  Location: [facility] ENDOSCOPY;  Service: Pulmonary;  Laterality: Left;   CHEST TUBE INSERTION N/A 03/19/2022   Procedure: REMOVAL PLEURAL DRAINAGE CATHETER;  Surgeon: Omar Person, MD;  Location: [facility] ENDOSCOPY;   Service: Pulmonary;  Laterality: N/A;   EYE SURGERY Left 04/06/2020   Implant placed   EYE SURGERY Right 03/30/2020   Implant placed    hysterectomy     LOOP RECORDER INSERTION N/A 05/20/2018   Procedure: LOOP RECORDER INSERTION;  Surgeon: Hillis Range, MD;  Location: [facility] INVASIVE CV LAB;  Service: Cardiovascular;  Laterality: N/A;   TEE WITHOUT CARDIOVERSION N/A 05/20/2018   Procedure: TRANSESOPHAGEAL ECHOCARDIOGRAM (TEE);  Surgeon: Quintella Reichert, MD;  Location: [facility] ENDOSCOPY;  Service: Cardiovascular;  Laterality: N/A;   Social History:  reports that she quit smoking about 38 years ago. Her smoking use included cigarettes. She started smoking about 53 years ago. She has a 15 pack-year smoking history. She has never used smokeless tobacco. She reports that she does not drink alcohol and does not use drugs.  Allergies  Allergen Reactions   Sulfa Antibiotics Itching    Family History  Problem Relation Age of Onset   Hypertension Mother    Stroke Mother    Hypertension Father      Prior to Admission medications   Medication Sig Start Date End Date Taking? Authorizing Provider  allopurinol (ZYLOPRIM) 100 MG tablet Take 1 tablet (100 mg total) by mouth daily. 02/19/23  Yes [name], MD  amLODipine (NORVASC) 10 MG tablet Take 1 tablet (10 mg total) by mouth daily. 02/19/23  Yes [name], MD  aspirin EC 81 MG tablet Take 81 mg by mouth in the morning. Swallow whole.   Yes [provider]  atorvastatin (LIPITOR) 80 MG tablet TAKE 1 TABLET EVERY EVENING 04/22/23  Yes [name], MD  carvedilol (COREG) 25  MG tablet Take 1 tablet (25 mg total) by mouth 2 (two) times daily with a meal. 02/19/23  Yes Newlin, Enobong, MD  cholecalciferol (VITAMIN D3) 25 MCG (1000 UNIT) tablet Take 1,000 Units by mouth daily.   Yes [provider]  ferrous sulfate 325 (65 FE) MG EC tablet Take 1 tablet (325 mg total) by mouth in the morning and at bedtime. 05/22/21  Yes Mayers,  Cari S, PA-C  furosemide (LASIX) 20 MG tablet Take 1 tablet (20 mg total) by mouth daily. For  3 to 5 days prn leg swelling 10/23/23  Yes Anders Simmonds, PA-C  gabapentin (NEURONTIN) 100 MG capsule Take 1 capsule (100 mg total) by mouth 3 (three) times daily. 02/19/23  Yes [name], MD  metFORMIN (GLUCOPHAGE) 500 MG tablet Take 1 tablet (500 mg total) by mouth daily with breakfast. 08/22/23  Yes [name], MD  Multiple Vitamin (MULTIVITAMIN WITH MINERALS) TABS tablet Take 1 tablet by mouth daily. 09/09/21  Yes Ghimire, Werner Lean, MD  ondansetron (ZOFRAN) 8 MG tablet Take 1 tablet (8 mg total) by mouth every 8 (eight) hours as needed. 10/06/23  Yes Shalhoub, Deno Lunger, MD  polyethylene glycol (MIRALAX / GLYCOLAX) 17 g packet Take 17 g by mouth daily.   Yes Serena Croissant, MD    Physical Exam: BP (!) 140/60   Pulse 83   Temp 99.9 F (37.7 C) (Oral)   Resp 18   Ht 5\' 2"  (1.575 m)   Wt 70.8 kg   SpO2 93%   BMI 28.53 kg/m  General:  Alert, oriented, calm, in no acute distress, resting on room air.  Her daughter is at the bedside. Cardiovascular: RRR, no murmurs or rubs, no peripheral edema  Respiratory: clear to auscultation bilaterally, no wheezes, no crackles  Abdomen: soft, nontender, distended with fluid wave, normal bowel tones heard  Skin: dry, no rashes  Musculoskeletal: no joint effusions, normal range of motion  Psychiatric: appropriate affect, normal speech  Neurologic: extraocular muscles intact, clear speech, moving all extremities with intact sensorium         Labs on Admission:  Basic Metabolic Panel: Recent Labs  Lab 11/07/23 1116  NA 133*  K 4.6  CL 103  CO2 22  GLUCOSE 161*  BUN 27*  CREATININE 0.94  CALCIUM 11.5*   Liver Function Tests: Recent Labs  Lab 11/07/23 1116  AST 203*  ALT 85*  ALKPHOS 239*  BILITOT 1.2  PROT 7.2  ALBUMIN 2.8*   No results for input(s): "LIPASE", "AMYLASE" in the last 168 hours. No results for input(s): "AMMONIA"  in the last 168 hours. CBC: Recent Labs  Lab 11/07/23 1116  WBC 12.9*  NEUTROABS 9.7*  HGB 9.1*  HCT 28.2*  MCV 80.3  PLT 300   Cardiac Enzymes: No results for input(s): "CKTOTAL", "CKMB", "CKMBINDEX", "TROPONINI" in the last 168 hours. BNP (last 3 results) Recent Labs    11/07/23 1116  BNP 146.9*    ProBNP (last 3 results) No results for input(s): "PROBNP" in the last 8760 hours.  CBG: No results for input(s): "GLUCAP" in the last 168 hours.  Radiological Exams on Admission: CT ABDOMEN PELVIS W CONTRAST Result Date: 11/07/2023 CLINICAL DATA:  Abdominal pain, acute, nonlocalized; Pulmonary embolism (PE) suspected, high prob EXAM: CT ANGIOGRAPHY CHEST CT ABDOMEN AND PELVIS WITH CONTRAST TECHNIQUE: Multidetector CT imaging of the chest was performed using the standard protocol during bolus administration of intravenous contrast. Multiplanar CT image reconstructions and MIPs were obtained to evaluate  the vascular anatomy. Multidetector CT imaging of the abdomen and pelvis was performed using the standard protocol during bolus administration of intravenous contrast. RADIATION DOSE REDUCTION: This exam was performed according to the departmental dose-optimization program which includes automated exposure control, adjustment of the mA and/or kV according to patient size and/or use of iterative reconstruction technique. CONTRAST:  <See Chart> OMNIPAQUE IOHEXOL 300 MG/ML SOLN, OMNIPAQUE IOHEXOL 350 MG/ML SOLN COMPARISON:  CT scan abdomen and pelvis from 09/27/2023 and CT chest from 09/29/2023. FINDINGS: CTA CHEST FINDINGS Cardiovascular: No evidence of embolism to the proximal subsegmental pulmonary artery level. Normal cardiac size. No pericardial effusion. No aortic aneurysm. There are coronary artery calcifications, in keeping with coronary artery disease. There are also mild peripheral atherosclerotic vascular calcifications of thoracic aorta and its major branches. Mediastinum/Nodes:  Visualized thyroid gland appears grossly unremarkable. No solid / cystic mediastinal masses. The esophagus is nondistended precluding optimal assessment. There is mild circumferential thickening of the lower thoracic esophagus, which is most likely seen in the settings of chronic gastroesophageal reflux disease versus esophagitis. No axillary, mediastinal or hilar lymphadenopathy by size criteria. Lungs/Pleura: The central tracheo-bronchial tree is patent. There are patchy areas of linear, plate-like atelectasis and/or scarring throughout bilateral lungs. There is trace bilateral pleural effusion with associated compressive atelectatic changes in the bilateral lower lobes. There is a heterogeneous peripherally hyperattenuating and centrally hypoattenuating approximately 1.1 x 2.0 cm pleural based nodule in the left lung lower lobe, indeterminate, but unchanged since the prior study from 09/20/2023. Attention on follow-up examination is recommended. Bilateral lungs are otherwise clear. Musculoskeletal: Redemonstration of central left breast mass as well as asymmetric left skin breast skin thickening, grossly similar to the prior study. The visualized soft tissues of the chest wall are otherwise grossly unremarkable. Redemonstration of heterogeneous mixed sclerotic/lytic appearance of the sternal body along with pathological fracture, similar to the prior study. There are mild multilevel degenerative changes in the visualized spine. Review of the MIP images confirms the above findings. CT ABDOMEN and PELVIS FINDINGS Hepatobiliary: The liver is normal in size. Non-cirrhotic configuration. Redemonstration of extensive ill-defined heterogeneous hypoattenuating liver masses occupying approximately 50% of the total liver parenchyma, compatible with metastasis. Comparison with prior study is limited due to lack of intravenous contrast however, overall, there is no significant interval change. The hepatic vein and portal  vein tributaries appear compressed however, no discrete thrombosis noted. No intrahepatic or extrahepatic bile duct dilation. Contracted gallbladder. No calcified gallstones. Normal gallbladder wall thickness. No pericholecystic inflammatory changes. Pancreas: Unremarkable. No pancreatic ductal dilatation or surrounding inflammatory changes. Spleen: Within normal limits. No focal lesion. Adrenals/Urinary Tract: Adrenal glands are unremarkable. No suspicious renal mass. Redemonstration of multiple simple cysts scattered throughout bilateral kidneys. No hydroureteronephrosis or nephroureterolithiasis. Urinary bladder is under distended, precluding optimal assessment. However, no large mass or stones identified. No perivesical fat stranding. Stomach/Bowel: No disproportionate dilation of the small or large bowel loops. No evidence of abnormal bowel wall thickening or inflammatory changes. The appendix is unremarkable. Vascular/Lymphatic: There is small-to-moderate ascites, increased since the prior study. No pneumoperitoneum. No abdominal or pelvic lymphadenopathy, by size criteria. No aneurysmal dilation of the major abdominal arteries. There are moderate peripheral atherosclerotic vascular calcifications of the aorta and its major branches. Reproductive: The uterus is surgically absent. No large adnexal mass. There are heterogeneous soft tissue attenuation areas along the bilateral pelvic sidewalls, which may represent residual ovarian tissue versus peritoneal carcinomatosis. No significant interval change. Other: There is a tiny fat containing umbilical  hernia. There is small to moderate anasarca. Musculoskeletal: No suspicious osseous lesions. There are mild multilevel degenerative changes in the visualized spine. Review of the MIP images confirms the above findings. IMPRESSION: 1. No embolism to the proximal subsegmental pulmonary artery level. 2. There is trace bilateral pleural effusion with associated  compressive atelectatic changes in the bilateral lower lobes. 3. Redemonstration of extensive liver metastases, grossly similar to the prior study. 4. There is small-to-moderate ascites, increased since the prior study. 5. Redemonstration of central left breast mass and asymmetric left breast skin thickening, grossly similar to the prior study. 6. Redemonstration of heterogeneous mixed sclerotic/lytic appearance of the sternal body along with pathological fracture, similar to the prior study. 7. Multiple other nonacute observations, as described above. Aortic Atherosclerosis (ICD10-I70.0). Electronically Signed   By: Jules Schick M.D.   On: 11/07/2023 13:15   CT Angio Chest PE W/Cm &/Or Wo Cm Result Date: 11/07/2023 CLINICAL DATA:  Abdominal pain, acute, nonlocalized; Pulmonary embolism (PE) suspected, high prob EXAM: CT ANGIOGRAPHY CHEST CT ABDOMEN AND PELVIS WITH CONTRAST TECHNIQUE: Multidetector CT imaging of the chest was performed using the standard protocol during bolus administration of intravenous contrast. Multiplanar CT image reconstructions and MIPs were obtained to evaluate the vascular anatomy. Multidetector CT imaging of the abdomen and pelvis was performed using the standard protocol during bolus administration of intravenous contrast. RADIATION DOSE REDUCTION: This exam was performed according to the departmental dose-optimization program which includes automated exposure control, adjustment of the mA and/or kV according to patient size and/or use of iterative reconstruction technique. CONTRAST:  <See Chart> OMNIPAQUE IOHEXOL 300 MG/ML SOLN, OMNIPAQUE IOHEXOL 350 MG/ML SOLN COMPARISON:  CT scan abdomen and pelvis from 09/27/2023 and CT chest from 09/29/2023. FINDINGS: CTA CHEST FINDINGS Cardiovascular: No evidence of embolism to the proximal subsegmental pulmonary artery level. Normal cardiac size. No pericardial effusion. No aortic aneurysm. There are coronary artery calcifications, in  keeping with coronary artery disease. There are also mild peripheral atherosclerotic vascular calcifications of thoracic aorta and its major branches. Mediastinum/Nodes: Visualized thyroid gland appears grossly unremarkable. No solid / cystic mediastinal masses. The esophagus is nondistended precluding optimal assessment. There is mild circumferential thickening of the lower thoracic esophagus, which is most likely seen in the settings of chronic gastroesophageal reflux disease versus esophagitis. No axillary, mediastinal or hilar lymphadenopathy by size criteria. Lungs/Pleura: The central tracheo-bronchial tree is patent. There are patchy areas of linear, plate-like atelectasis and/or scarring throughout bilateral lungs. There is trace bilateral pleural effusion with associated compressive atelectatic changes in the bilateral lower lobes. There is a heterogeneous peripherally hyperattenuating and centrally hypoattenuating approximately 1.1 x 2.0 cm pleural based nodule in the left lung lower lobe, indeterminate, but unchanged since the prior study from 09/20/2023. Attention on follow-up examination is recommended. Bilateral lungs are otherwise clear. Musculoskeletal: Redemonstration of central left breast mass as well as asymmetric left skin breast skin thickening, grossly similar to the prior study. The visualized soft tissues of the chest wall are otherwise grossly unremarkable. Redemonstration of heterogeneous mixed sclerotic/lytic appearance of the sternal body along with pathological fracture, similar to the prior study. There are mild multilevel degenerative changes in the visualized spine. Review of the MIP images confirms the above findings. CT ABDOMEN and PELVIS FINDINGS Hepatobiliary: The liver is normal in size. Non-cirrhotic configuration. Redemonstration of extensive ill-defined heterogeneous hypoattenuating liver masses occupying approximately 50% of the total liver parenchyma, compatible with  metastasis. Comparison with prior study is limited due to lack of intravenous contrast  however, overall, there is no significant interval change. The hepatic vein and portal vein tributaries appear compressed however, no discrete thrombosis noted. No intrahepatic or extrahepatic bile duct dilation. Contracted gallbladder. No calcified gallstones. Normal gallbladder wall thickness. No pericholecystic inflammatory changes. Pancreas: Unremarkable. No pancreatic ductal dilatation or surrounding inflammatory changes. Spleen: Within normal limits. No focal lesion. Adrenals/Urinary Tract: Adrenal glands are unremarkable. No suspicious renal mass. Redemonstration of multiple simple cysts scattered throughout bilateral kidneys. No hydroureteronephrosis or nephroureterolithiasis. Urinary bladder is under distended, precluding optimal assessment. However, no large mass or stones identified. No perivesical fat stranding. Stomach/Bowel: No disproportionate dilation of the small or large bowel loops. No evidence of abnormal bowel wall thickening or inflammatory changes. The appendix is unremarkable. Vascular/Lymphatic: There is small-to-moderate ascites, increased since the prior study. No pneumoperitoneum. No abdominal or pelvic lymphadenopathy, by size criteria. No aneurysmal dilation of the major abdominal arteries. There are moderate peripheral atherosclerotic vascular calcifications of the aorta and its major branches. Reproductive: The uterus is surgically absent. No large adnexal mass. There are heterogeneous soft tissue attenuation areas along the bilateral pelvic sidewalls, which may represent residual ovarian tissue versus peritoneal carcinomatosis. No significant interval change. Other: There is a tiny fat containing umbilical hernia. There is small to moderate anasarca. Musculoskeletal: No suspicious osseous lesions. There are mild multilevel degenerative changes in the visualized spine. Review of the MIP images  confirms the above findings. IMPRESSION: 1. No embolism to the proximal subsegmental pulmonary artery level. 2. There is trace bilateral pleural effusion with associated compressive atelectatic changes in the bilateral lower lobes. 3. Redemonstration of extensive liver metastases, grossly similar to the prior study. 4. There is small-to-moderate ascites, increased since the prior study. 5. Redemonstration of central left breast mass and asymmetric left breast skin thickening, grossly similar to the prior study. 6. Redemonstration of heterogeneous mixed sclerotic/lytic appearance of the sternal body along with pathological fracture, similar to the prior study. 7. Multiple other nonacute observations, as described above. Aortic Atherosclerosis (ICD10-I70.0). Electronically Signed   By: Jules Schick M.D.   On: 11/07/2023 13:15   DG Chest 2 View Result Date: 11/07/2023 CLINICAL DATA:  Shortness of breath. EXAM: CHEST - 2 VIEW COMPARISON:  09/27/2023. FINDINGS: Low lung volume. Mild diffuse pulmonary vascular congestion, likely accentuated by low lung volume. No frank pulmonary edema. Redemonstration of left retrocardiac airspace opacity obscuring the left hemidiaphragm, descending thoracic aorta and blunting the left lateral costophrenic angle, suggesting combination of left lung atelectasis and/or consolidation with pleural effusion. No significant interval change. Bilateral lung fields are otherwise clear. Right costophrenic angle is clear. Stable mildly enlarged cardio-mediastinal silhouette. Loop recorder device noted overlying the left anterior lower chest wall. No acute osseous abnormalities. The soft tissues are within normal limits. IMPRESSION: *Persistent left retrocardiac opacity, as described above. No significant interval change. Electronically Signed   By: Jules Schick M.D.   On: 11/07/2023 11:23   Assessment/Plan Heather Rogers is a 78 y.o. female with medical history significant for  hypertension, metastatic breast cancer, diabetes, CKD stage IIIb admitted to the hospital with symptomatic abdominal ascites.  Abdominal ascites-presumably malignant, this is likely the cause of her dyspnea and discomfort.  She has minimal abdominal pain, no fevers, I have a low index of suspicion for SBP. -Inpatient admission -IR consult placed for diagnostic and therapeutic paracentesis, with appropriate lab studies ordered  Metastatic breast cancer-under the care of Dr. Pamelia Hoit -Dr. Pamelia Hoit added to inpatient treatment team  Iron deficiency-continue oral iron supplementation  Hypertension-continue home amlodipine  Type 2 diabetes-almost well-controlled, carb modified diet with sliding scale insulin  DVT prophylaxis: Lovenox     Code Status: Full Code  Consults called: IR consult requested for diagnostic and therapeutic paracentesis, Dr. Pamelia Hoit notified via secure chat.  Admission status: The appropriate patient status for this patient is INPATIENT. Inpatient status is judged to be reasonable and necessary in order to provide the required intensity of service to ensure the patient's safety. The patient's presenting symptoms, physical exam findings, and initial radiographic and laboratory data in the context of their chronic comorbidities is felt to place them at high risk for further clinical deterioration. Furthermore, it is not anticipated that the patient will be medically stable for discharge from the hospital within 2 midnights of admission.    I certify that at the point of admission it is my clinical judgment that the patient will require inpatient hospital care spanning beyond 2 midnights from the point of admission due to high intensity of service, high risk for further deterioration and high frequency of surveillance required  Time spent: 59 minutes  Charonda Hefter Sharlette Dense MD [facility] Pager [phone]  If 7PM-7AM, please contact night-coverage [email or web address] Password  [facility]  11/07/2023, 2:56 PM

## 2023-11-07 NOTE — Plan of Care (Signed)

## 2023-11-07 NOTE — Progress Notes (Signed)
 HEMATOLOGY-ONCOLOGY PROGRESS NOTE  SUBJECTIVE: Patient with metastatic breast cancer admitted with ascites.  She became extremely weak and abdomen got markedly distended and required admission to the hospital.  Oncology History  Breast mass, left  09/04/2021 Initial Diagnosis   Breast mass, left   Primary malignant neoplasm of breast with metastasis (HCC)  09/14/2021 Initial Diagnosis   Large neglected left breast cancer for several years.  Hospitalization 09/02/2021-09/08/2021: Shortness of breath, large pleural effusion, thoracentesis, pleural fluid cytology: Metastatic adenocarcinoma breast origin ER/PR positive HER2 negative   11/04/2023 -  Chemotherapy   Patient is on Treatment Plan : BREAST Fam-Trastuzumab Deruxtecan-nxki (Enhertu) (5.4) q21d     Breast cancer metastasized to liver, left (HCC)  10/01/2023 Initial Diagnosis   Breast cancer metastasized to liver, left (HCC)   11/04/2023 -  Chemotherapy   Patient is on Treatment Plan : BREAST Fam-Trastuzumab Deruxtecan-nxki (Enhertu) (5.4) q21d       OBJECTIVE: REVIEW OF SYSTEMS:   Generalized fatigue and weakness and abdominal distention All other systems were reviewed with the patient and are negative.    PHYSICAL EXAMINATION: ECOG PERFORMANCE STATUS: 3 - Symptomatic, >50% confined to bed  Vitals:   11/07/23 1430 11/07/23 1500  BP: (!) 140/60 (!) 140/60  Pulse: 83 80  Resp: 18 18  Temp:    SpO2: 93% 95%   Filed Weights   11/07/23 0956  Weight: 156 lb (70.8 kg)    Abdomen: Distended and dullness to percussion of the flanks with moderate to marked ascites.  LABORATORY DATA:  I have reviewed the data as listed    Latest Ref Rng & Units 11/07/2023   11:16 AM 10/06/2023   11:19 AM 10/05/2023    5:48 AM  CMP  Glucose 70 - 99 mg/dL 409  811  914   BUN 8 - 23 mg/dL 27  24  37   Creatinine 0.44 - 1.00 mg/dL 7.82  9.56  2.13   Sodium 135 - 145 mmol/L 133  143  142   Potassium 3.5 - 5.1 mmol/L 4.6  3.6  3.9   Chloride 98 - 111  mmol/L 103  115  118   CO2 22 - 32 mmol/L 22  19  17    Calcium 8.9 - 10.3 mg/dL 08.6  8.5  8.5   Total Protein 6.5 - 8.1 g/dL 7.2  5.8  5.4   Total Bilirubin 0.0 - 1.2 mg/dL 1.2  0.6  0.9   Alkaline Phos 38 - 126 U/L 239  127  121   AST 15 - 41 U/L 203  98  75   ALT 0 - 44 U/L 85  68  54     Lab Results  Component Value Date   WBC 12.9 (H) 11/07/2023   HGB 9.1 (L) 11/07/2023   HCT 28.2 (L) 11/07/2023   MCV 80.3 11/07/2023   PLT 300 11/07/2023   NEUTROABS 9.7 (H) 11/07/2023    ASSESSMENT AND PLAN: 1.  Metastatic breast cancer with ascites: Patient is going to start on systemic chemotherapy with Enhertu next week. 2. abdominal distention with ascites: Patient is going to get a paracentesis.  Please send cytology and for breast prognostic panel.   I will follow her next week in my office to start her first cycle of chemotherapy.

## 2023-11-08 ENCOUNTER — Inpatient Hospital Stay (HOSPITAL_COMMUNITY)

## 2023-11-08 DIAGNOSIS — C7981 Secondary malignant neoplasm of breast: Secondary | ICD-10-CM | POA: Diagnosis not present

## 2023-11-08 DIAGNOSIS — R180 Malignant ascites: Secondary | ICD-10-CM | POA: Diagnosis not present

## 2023-11-08 LAB — COMPREHENSIVE METABOLIC PANEL WITH GFR
ALT: 73 U/L — ABNORMAL HIGH (ref 0–44)
AST: 177 U/L — ABNORMAL HIGH (ref 15–41)
Albumin: 2.4 g/dL — ABNORMAL LOW (ref 3.5–5.0)
Alkaline Phosphatase: 186 U/L — ABNORMAL HIGH (ref 38–126)
Anion gap: 7 (ref 5–15)
BUN: 25 mg/dL — ABNORMAL HIGH (ref 8–23)
CO2: 20 mmol/L — ABNORMAL LOW (ref 22–32)
Calcium: 11.6 mg/dL — ABNORMAL HIGH (ref 8.9–10.3)
Chloride: 108 mmol/L (ref 98–111)
Creatinine, Ser: 1.14 mg/dL — ABNORMAL HIGH (ref 0.44–1.00)
GFR, Estimated: 49 mL/min — ABNORMAL LOW (ref >60)
Glucose, Bld: 87 mg/dL (ref 70–99)
Potassium: 4.3 mmol/L (ref 3.5–5.1)
Sodium: 135 mmol/L (ref 135–145)
Total Bilirubin: 1.5 mg/dL — ABNORMAL HIGH (ref 0.0–1.2)
Total Protein: 6 g/dL — ABNORMAL LOW (ref 6.5–8.1)

## 2023-11-08 LAB — CBC
HCT: 26.7 % — ABNORMAL LOW (ref 36.0–46.0)
HCT: 28.8 % — ABNORMAL LOW (ref 36.0–46.0)
Hemoglobin: 8.3 g/dL — ABNORMAL LOW (ref 12.0–15.0)
Hemoglobin: 9.3 g/dL — ABNORMAL LOW (ref 12.0–15.0)
MCH: 25.9 pg — ABNORMAL LOW (ref 26.0–34.0)
MCH: 26.1 pg (ref 26.0–34.0)
MCHC: 31.1 g/dL (ref 30.0–36.0)
MCHC: 32.3 g/dL (ref 30.0–36.0)
MCV: 83.4 fL (ref 80.0–100.0)
MCV: 80.9 fL (ref 80.0–100.0)
Platelets: 229 10*3/uL (ref 150–400)
Platelets: 271 10*3/uL (ref 150–400)
RBC: 3.2 MIL/uL — ABNORMAL LOW (ref 3.87–5.11)
RBC: 3.56 MIL/uL — ABNORMAL LOW (ref 3.87–5.11)
RDW: 17.1 % — ABNORMAL HIGH (ref 11.5–15.5)
RDW: 16.9 % — ABNORMAL HIGH (ref 11.5–15.5)
WBC: 11.1 10*3/uL — ABNORMAL HIGH (ref 4.0–10.5)
WBC: 10.2 10*3/uL (ref 4.0–10.5)
nRBC: 0.2 % (ref 0.0–0.2)
nRBC: 0.2 % (ref 0.0–0.2)

## 2023-11-08 LAB — URINALYSIS, ROUTINE W REFLEX MICROSCOPIC
Bacteria, UA: NONE SEEN
Bilirubin Urine: NEGATIVE
Glucose, UA: NEGATIVE mg/dL
Hgb urine dipstick: NEGATIVE
Ketones, ur: 5 mg/dL — AB
Leukocytes,Ua: NEGATIVE
Nitrite: NEGATIVE
Protein, ur: 100 mg/dL — AB
Specific Gravity, Urine: 1.033 — ABNORMAL HIGH (ref 1.005–1.030)
pH: 5 (ref 5.0–8.0)

## 2023-11-08 LAB — BODY FLUID CELL COUNT WITH DIFFERENTIAL
Eos, Fluid: 0 %
Lymphs, Fluid: 10 %
Monocyte-Macrophage-Serous Fluid: 62 % (ref 50–90)
Neutrophil Count, Fluid: 28 % — ABNORMAL HIGH (ref 0–25)
Total Nucleated Cell Count, Fluid: 478 uL (ref 0–1000)

## 2023-11-08 LAB — GLUCOSE, CAPILLARY
Glucose-Capillary: 92 mg/dL (ref 70–99)
Glucose-Capillary: 105 mg/dL — ABNORMAL HIGH (ref 70–99)
Glucose-Capillary: 84 mg/dL (ref 70–99)
Glucose-Capillary: 80 mg/dL (ref 70–99)

## 2023-11-08 LAB — LACTIC ACID, PLASMA: Lactic Acid, Venous: 1.2 mmol/L (ref 0.5–1.9)

## 2023-11-08 LAB — AMMONIA: Ammonia: 26 umol/L (ref 9–35)

## 2023-11-08 MED ORDER — SODIUM CHLORIDE 0.9 % IV SOLN: INTRAVENOUS | Status: DC

## 2023-11-08 MED ORDER — ENSURE ENLIVE PO LIQD: 237.0000 mL | Freq: Two times a day (BID) | ORAL | Status: DC

## 2023-11-08 MED ORDER — LIDOCAINE HCL 1 % IJ SOLN
INTRAMUSCULAR | Status: AC
  Filled 2023-11-08: qty 20

## 2023-11-08 MED ORDER — KETOROLAC TROMETHAMINE 15 MG/ML IJ SOLN
15.0000 mg | Freq: Once | INTRAMUSCULAR | Status: AC
  Administered 2023-11-08: 15 mg via INTRAVENOUS
  Filled 2023-11-08: qty 1

## 2023-11-08 MED ORDER — ENSURE ENLIVE PO LIQD
237.0000 mL | Freq: Two times a day (BID) | ORAL | Status: DC
  Administered 2023-11-08 – 2023-11-10 (×3): 237 mL via ORAL

## 2023-11-08 MED ORDER — SODIUM CHLORIDE 0.9 % IV SOLN: INTRAVENOUS | Status: AC

## 2023-11-08 MED ORDER — SODIUM CHLORIDE 0.9 % IV SOLN
2.0000 g | INTRAVENOUS | Status: DC
  Administered 2023-11-08 – 2023-11-09 (×2): 2 g via INTRAVENOUS
  Filled 2023-11-08 (×2): qty 20

## 2023-11-08 NOTE — Procedures (Signed)
 PROCEDURE SUMMARY:  Successful image-guided paracentesis from the right lower abdomen.  Yielded 1.3 liters of yellow fluid.  No immediate complications.  EBL: trace Patient tolerated well.   Specimen was sent for labs.  Please see imaging section of Epic for full dictation.  Janifer Adie Saturnino Liew PA-C 11/08/2023 2:36 PM

## 2023-11-08 NOTE — TOC Initial Note (Signed)
 Transition of Care [facility]) - Initial/Assessment Note    Patient Details  Name: Heather Rogers MRN: [id number] Date of Birth: 06/24/1946  [facility]) CM/SW Contact:    Beckie Busing, RN Phone Number:[phone]  11/08/2023, 3:51 PM  Clinical Narrative:                 TOC following patient with high risk for readmission. Patient states that she is from home with her grandson. Patient reports that she can get around with her walker. Patient has PCP listed but is unable to verbalize is she follows up on a regular basis. Patient has some fluctuations in her orientation. TOC will follow up with family for more details for assessment.   Expected Discharge Plan: Home w Home Health Services Barriers to Discharge: Continued Medical Work up   Patient Goals and CMS Choice Patient states their goals for this hospitalization and ongoing recovery are:: Wants to go home with grandson   Choice offered to / list presented to : NA [place] ownership interest in [facility].provided to::  (n/a)    Expected Discharge Plan and Services In-house Referral: NA Discharge Planning Services: CM Consult Post Acute Care Choice: NA Living arrangements for the past 2 months: Single Family Home                 DME Arranged: N/A DME Agency: NA       HH Arranged: NA HH Agency: NA        Prior Living Arrangements/Services Living arrangements for the past 2 months: Single Family Home Lives with:: Other (Comment) (grandson) Patient language and need for interpreter reviewed:: Yes Do you feel safe going back to the place where you live?: Yes      Need for Family Participation in Patient Care: Yes (Comment) Care giver support system in place?: Yes (comment) Current home services: DME (rolling walker and bedside commode) Criminal Activity/Legal Involvement Pertinent to Current Situation/Hospitalization: No - Comment as needed  Activities of Daily Living   ADL Screening  (condition at time of admission) Independently performs ADLs?: No Does the patient have a NEW difficulty with bathing/dressing/toileting/self-feeding that is expected to last >3 days?: No Does the patient have a NEW difficulty with getting in/out of bed, walking, or climbing stairs that is expected to last >3 days?: No Does the patient have a NEW difficulty with communication that is expected to last >3 days?: No Is the patient deaf or have difficulty hearing?: No Does the patient have difficulty seeing, even when wearing glasses/contacts?: Yes Does the patient have difficulty concentrating, remembering, or making decisions?: Yes  Permission Sought/Granted Permission sought to share information with : Family Supports Permission granted to share information with : No              Emotional Assessment Appearance:: Appears stated age Attitude/Demeanor/Rapport: Gracious Affect (typically observed): Pleasant, Quiet Orientation: : Oriented to Self, Oriented to Place, Fluctuating Orientation (Suspected and/or reported Sundowners) Alcohol / Substance Use: Not Applicable Psych Involvement: No (comment)  Admission diagnosis:  Hypercalcemia [E83.52] Dyspnea on exertion [R06.09] Malignant ascites [R18.0] Patient Active Problem List   Diagnosis Date Noted   Generalized weakness 10/05/2023   Protein-calorie malnutrition, moderate (HCC) 10/05/2023   Uncontrolled type 2 diabetes mellitus with hyperglycemia, with long-term current use of insulin (HCC) 10/05/2023   Breast cancer metastasized to liver, left (HCC) 10/01/2023   C. difficile colitis 10/01/2023   Acute metabolic encephalopathy 09/28/2023   Elevated LFTs 09/28/2023  Malignant ascites 09/28/2023   Acute on chronic anemia 09/28/2023   Chronic kidney disease, stage 3b (HCC) 02/18/2022   Hyperlipidemia associated with type 2 diabetes mellitus (HCC) 02/18/2022   Primary malignant neoplasm of breast with metastasis (HCC) 09/14/2021    Hypertrophic cardiomyopathy (HCC) 09/04/2021   Breast mass, left 09/04/2021   Cognitive decline 09/04/2021   Positive D dimer 09/03/2021   Pleural effusion 09/03/2021   Pleural effusion on left 09/02/2021   Acute renal failure superimposed on stage 3b chronic kidney disease (HCC) 09/02/2021   Atherosclerotic cerebrovascular disease 05/23/2021   Anemia of chronic disease 05/23/2021   Gout 02/14/2021   Syncope 07/25/2020   Type 2 diabetes mellitus (A1c 6.4 on 2/5)  07/25/2020   Status post CVA 07/25/2020   Dyslipidemia 07/25/2020   Hypertension associated with diabetes (HCC) 07/25/2020   Coagulopathy (HCC) 07/25/2020   History of CVA 11/22/2018   PCP:  [name], MD Pharmacy:   [facility] [id number] - [address], [place] - [address] [facility] [address] [place] [address] Phone: [phone] Fax: [phone]  [facility] - [place], [place] - [address] [place] [address] Phone: [phone] Fax: [phone]  [facility] - [place], [place] - [address] [place] [address] Phone: [phone] Fax: [phone]     Social Drivers of Health (SDOH) Social History: SDOH Screenings   Food Insecurity: No Food Insecurity (11/07/2023)  Housing: Low Risk  (11/07/2023)  Transportation Needs: No Transportation Needs (11/07/2023)  Utilities: Not At Risk (11/07/2023)  Alcohol Screen: Low Risk  (05/22/2023)  Depression (PHQ2-9): Low Risk  (10/23/2023)  Financial Resource Strain: Low Risk  (08/22/2023)  Physical Activity: Inactive (08/22/2023)  Social Connections: Unknown (11/07/2023)  Stress: No Stress Concern Present (08/22/2023)  Tobacco Use: Medium Risk (11/07/2023)  Health Literacy: Adequate Health Literacy (05/22/2023)   SDOH Interventions:     Readmission Risk Interventions    11/08/2023    3:30 PM  Readmission Risk Prevention Plan   Transportation Screening Complete  PCP or Specialist Appt within 3-5 Days Complete  HRI or Home Care Consult Complete  Social Work Consult for Recovery Care Planning/Counseling Complete  Medication Review Oceanographer) Referral to Pharmacy

## 2023-11-08 NOTE — Plan of Care (Signed)

## 2023-11-08 NOTE — Progress Notes (Signed)
 PROGRESS NOTE    Heather Rogers  ZOX:[id number] DOB: May 22, 1946 DOA: 11/07/2023 PCP: [name], MD   Brief Narrative:  This 78 y.o. female with medical history significant for hypertension, metastatic breast cancer, diabetes, CKD stage IIIb presented to the hospital with symptomatic abdominal ascites.  Patient was recently hospitalized on 10/06/23 with C. difficile colitis during which she was noted to have abdominal ascites.  Over the course of the last month patient has gradual worsening of her ascites with associated shortness of breath with exertion and intermittent abdominal pain.  She has been taking her diuretics since hospital discharge but in fact her lower extremity edema is much improved from prior.  Workup in the ED reveals evidence of moderate abdominal ascites.  Patient is admitted for further evaluation.  Patient is going to have paracentesis by IR today.  Assessment & Plan:   Principal Problem:   Malignant ascites  Abdominal Ascites - presumably malignant. This is likely the cause of her dyspnea and discomfort.   She has minimal abdominal pain, denies any fever, Initially low index of suspicion for SBP. IR consulted for diagnostic and therapeutic paracentesis,  appropriate lab studies ordered. She now has spiked fever, starting ceftriaxone for SBP prophylaxis.  Metastatic breast cancer: She follows with Dr. Pamelia Hoit. Patient is going to start chemotherapy next week.  Iron deficiency Anemia:  Continue oral iron supplementation.   Hypertension: Continue home amlodipine.   Type 2 diabetes: Almost  well-controlled, carb modified diet with sliding scale insulin   Hypercalcemia:  Likely in the setting of cancer. Continue IV hydration.  Continue to monitor.  Acute kidney injury : Likely prerenal. Continue IV hydration. Avoid nephrotoxic   DVT prophylaxis: Lovenox Code Status: Full code Family Communication: No family at bedside Disposition Plan:    Status  is: Inpatient Remains inpatient appropriate because: Admitted for abdominal ascites,  symptomatic and requiring IR paracentesis.   Consultants:  Oncology  Procedures: None Antimicrobials:  Anti-infectives (From admission, onward)    None       Subjective: Patient was seen and examined at bedside.  Overnight events noted.   Patient reports doing better, reports pain is much improved. She still has abdominal distention.   She is scheduled to have IR paracentesis today.  Objective: Vitals:   11/07/23 1704 11/07/23 2125 11/08/23 0226 11/08/23 0542  BP: (!) 146/53 (!) 137/59 (!) 119/44 (!) 116/43  Pulse: 82 73 87 77  Resp: 18 18 18 18   Temp: 100.1 F (37.8 C) 97.8 F (36.6 C) (!) 101 F (38.3 C) 99.4 F (37.4 C)  TempSrc: Oral Oral Oral Oral  SpO2: 93% 93% 94% 94%  Weight: 71 kg     Height: 5\' 2"  (1.575 m)       Intake/Output Summary (Last 24 hours) at 11/08/2023 1155 Last data filed at 11/08/2023 0847 Gross per 24 hour  Intake 440 ml  Output --  Net 440 ml   Filed Weights   11/07/23 0956 11/07/23 1704  Weight: 70.8 kg 71 kg    Examination:  General exam: Appears calm , comfortable, deconditioned, sick looking, not in any acute distress. Respiratory system: CTA bilaterally. Respiratory effort normal.  RR 16. Cardiovascular system: S1 & S2 heard, RRR. No JVD, murmurs, rubs, gallops or clicks.  Gastrointestinal system: Abdomen is moderately distended, soft and non tender. Normal bowel sounds heard. Central nervous system: Alert and oriented x 3. No focal neurological deficits. Extremities: Edema+, no cyanosis, no clubbing Skin: No rashes, lesions or ulcers Psychiatry:  Judgement and insight appear normal. Mood & affect appropriate.     Data Reviewed: I have personally reviewed following labs and imaging studies  CBC: Recent Labs  Lab 11/07/23 1116 11/08/23 0454  WBC 12.9* 11.1*  NEUTROABS 9.7*  --   HGB 9.1* 8.3*  HCT 28.2* 26.7*  MCV 80.3 83.4  PLT 300  229   Basic Metabolic Panel: Recent Labs  Lab 11/07/23 1116 11/08/23 0454  NA 133* 135  K 4.6 4.3  CL 103 108  CO2 22 20*  GLUCOSE 161* 87  BUN 27* 25*  CREATININE 0.94 1.14*  CALCIUM 11.5* 11.6*   GFR: Estimated Creatinine Clearance: 37.6 mL/min (A) (by C-G formula based on SCr of 1.14 mg/dL (H)). Liver Function Tests: Recent Labs  Lab 11/07/23 1116 11/08/23 0454  AST 203* 177*  ALT 85* 73*  ALKPHOS 239* 186*  BILITOT 1.2 1.5*  PROT 7.2 6.0*  ALBUMIN 2.8* 2.4*   No results for input(s): "LIPASE", "AMYLASE" in the last 168 hours. No results for input(s): "AMMONIA" in the last 168 hours. Coagulation Profile: No results for input(s): "INR", "PROTIME" in the last 168 hours. Cardiac Enzymes: No results for input(s): "CKTOTAL", "CKMB", "CKMBINDEX", "TROPONINI" in the last 168 hours. BNP (last 3 results) No results for input(s): "PROBNP" in the last 8760 hours. HbA1C: No results for input(s): "HGBA1C" in the last 72 hours. CBG: Recent Labs  Lab 11/07/23 1641 11/07/23 2128 11/08/23 0749 11/08/23 1134  GLUCAP 135* 93 92 105*   Lipid Profile: No results for input(s): "CHOL", "HDL", "LDLCALC", "TRIG", "CHOLHDL", "LDLDIRECT" in the last 72 hours. Thyroid Function Tests: No results for input(s): "TSH", "T4TOTAL", "FREET4", "T3FREE", "THYROIDAB" in the last 72 hours. Anemia Panel: No results for input(s): "VITAMINB12", "FOLATE", "FERRITIN", "TIBC", "IRON", "RETICCTPCT" in the last 72 hours. Sepsis Labs: No results for input(s): "PROCALCITON", "LATICACIDVEN" in the last 168 hours.  No results found for this or any previous visit (from the past 240 hours).       Radiology Studies: CT ABDOMEN PELVIS W CONTRAST Result Date: 11/07/2023 CLINICAL DATA:  Abdominal pain, acute, nonlocalized; Pulmonary embolism (PE) suspected, high prob EXAM: CT ANGIOGRAPHY CHEST CT ABDOMEN AND PELVIS WITH CONTRAST TECHNIQUE: Multidetector CT imaging of the chest was performed using the  standard protocol during bolus administration of intravenous contrast. Multiplanar CT image reconstructions and MIPs were obtained to evaluate the vascular anatomy. Multidetector CT imaging of the abdomen and pelvis was performed using the standard protocol during bolus administration of intravenous contrast. RADIATION DOSE REDUCTION: This exam was performed according to the departmental dose-optimization program which includes automated exposure control, adjustment of the mA and/or kV according to patient size and/or use of iterative reconstruction technique. CONTRAST:  <See Chart> OMNIPAQUE IOHEXOL 300 MG/ML SOLN, OMNIPAQUE IOHEXOL 350 MG/ML SOLN COMPARISON:  CT scan abdomen and pelvis from 09/27/2023 and CT chest from 09/29/2023. FINDINGS: CTA CHEST FINDINGS Cardiovascular: No evidence of embolism to the proximal subsegmental pulmonary artery level. Normal cardiac size. No pericardial effusion. No aortic aneurysm. There are coronary artery calcifications, in keeping with coronary artery disease. There are also mild peripheral atherosclerotic vascular calcifications of thoracic aorta and its major branches. Mediastinum/Nodes: Visualized thyroid gland appears grossly unremarkable. No solid / cystic mediastinal masses. The esophagus is nondistended precluding optimal assessment. There is mild circumferential thickening of the lower thoracic esophagus, which is most likely seen in the settings of chronic gastroesophageal reflux disease versus esophagitis. No axillary, mediastinal or hilar lymphadenopathy by size criteria. Lungs/Pleura: The central tracheo-bronchial  tree is patent. There are patchy areas of linear, plate-like atelectasis and/or scarring throughout bilateral lungs. There is trace bilateral pleural effusion with associated compressive atelectatic changes in the bilateral lower lobes. There is a heterogeneous peripherally hyperattenuating and centrally hypoattenuating approximately 1.1 x 2.0 cm  pleural based nodule in the left lung lower lobe, indeterminate, but unchanged since the prior study from 09/20/2023. Attention on follow-up examination is recommended. Bilateral lungs are otherwise clear. Musculoskeletal: Redemonstration of central left breast mass as well as asymmetric left skin breast skin thickening, grossly similar to the prior study. The visualized soft tissues of the chest wall are otherwise grossly unremarkable. Redemonstration of heterogeneous mixed sclerotic/lytic appearance of the sternal body along with pathological fracture, similar to the prior study. There are mild multilevel degenerative changes in the visualized spine. Review of the MIP images confirms the above findings. CT ABDOMEN and PELVIS FINDINGS Hepatobiliary: The liver is normal in size. Non-cirrhotic configuration. Redemonstration of extensive ill-defined heterogeneous hypoattenuating liver masses occupying approximately 50% of the total liver parenchyma, compatible with metastasis. Comparison with prior study is limited due to lack of intravenous contrast however, overall, there is no significant interval change. The hepatic vein and portal vein tributaries appear compressed however, no discrete thrombosis noted. No intrahepatic or extrahepatic bile duct dilation. Contracted gallbladder. No calcified gallstones. Normal gallbladder wall thickness. No pericholecystic inflammatory changes. Pancreas: Unremarkable. No pancreatic ductal dilatation or surrounding inflammatory changes. Spleen: Within normal limits. No focal lesion. Adrenals/Urinary Tract: Adrenal glands are unremarkable. No suspicious renal mass. Redemonstration of multiple simple cysts scattered throughout bilateral kidneys. No hydroureteronephrosis or nephroureterolithiasis. Urinary bladder is under distended, precluding optimal assessment. However, no large mass or stones identified. No perivesical fat stranding. Stomach/Bowel: No disproportionate dilation of  the small or large bowel loops. No evidence of abnormal bowel wall thickening or inflammatory changes. The appendix is unremarkable. Vascular/Lymphatic: There is small-to-moderate ascites, increased since the prior study. No pneumoperitoneum. No abdominal or pelvic lymphadenopathy, by size criteria. No aneurysmal dilation of the major abdominal arteries. There are moderate peripheral atherosclerotic vascular calcifications of the aorta and its major branches. Reproductive: The uterus is surgically absent. No large adnexal mass. There are heterogeneous soft tissue attenuation areas along the bilateral pelvic sidewalls, which may represent residual ovarian tissue versus peritoneal carcinomatosis. No significant interval change. Other: There is a tiny fat containing umbilical hernia. There is small to moderate anasarca. Musculoskeletal: No suspicious osseous lesions. There are mild multilevel degenerative changes in the visualized spine. Review of the MIP images confirms the above findings. IMPRESSION: 1. No embolism to the proximal subsegmental pulmonary artery level. 2. There is trace bilateral pleural effusion with associated compressive atelectatic changes in the bilateral lower lobes. 3. Redemonstration of extensive liver metastases, grossly similar to the prior study. 4. There is small-to-moderate ascites, increased since the prior study. 5. Redemonstration of central left breast mass and asymmetric left breast skin thickening, grossly similar to the prior study. 6. Redemonstration of heterogeneous mixed sclerotic/lytic appearance of the sternal body along with pathological fracture, similar to the prior study. 7. Multiple other nonacute observations, as described above. Aortic Atherosclerosis (ICD10-I70.0). Electronically Signed   By: Jules Schick M.D.   On: 11/07/2023 13:15   CT Angio Chest PE W/Cm &/Or Wo Cm Result Date: 11/07/2023 CLINICAL DATA:  Abdominal pain, acute, nonlocalized; Pulmonary embolism  (PE) suspected, high prob EXAM: CT ANGIOGRAPHY CHEST CT ABDOMEN AND PELVIS WITH CONTRAST TECHNIQUE: Multidetector CT imaging of the chest was performed using the standard protocol  during bolus administration of intravenous contrast. Multiplanar CT image reconstructions and MIPs were obtained to evaluate the vascular anatomy. Multidetector CT imaging of the abdomen and pelvis was performed using the standard protocol during bolus administration of intravenous contrast. RADIATION DOSE REDUCTION: This exam was performed according to the departmental dose-optimization program which includes automated exposure control, adjustment of the mA and/or kV according to patient size and/or use of iterative reconstruction technique. CONTRAST:  <See Chart> OMNIPAQUE IOHEXOL 300 MG/ML SOLN, OMNIPAQUE IOHEXOL 350 MG/ML SOLN COMPARISON:  CT scan abdomen and pelvis from 09/27/2023 and CT chest from 09/29/2023. FINDINGS: CTA CHEST FINDINGS Cardiovascular: No evidence of embolism to the proximal subsegmental pulmonary artery level. Normal cardiac size. No pericardial effusion. No aortic aneurysm. There are coronary artery calcifications, in keeping with coronary artery disease. There are also mild peripheral atherosclerotic vascular calcifications of thoracic aorta and its major branches. Mediastinum/Nodes: Visualized thyroid gland appears grossly unremarkable. No solid / cystic mediastinal masses. The esophagus is nondistended precluding optimal assessment. There is mild circumferential thickening of the lower thoracic esophagus, which is most likely seen in the settings of chronic gastroesophageal reflux disease versus esophagitis. No axillary, mediastinal or hilar lymphadenopathy by size criteria. Lungs/Pleura: The central tracheo-bronchial tree is patent. There are patchy areas of linear, plate-like atelectasis and/or scarring throughout bilateral lungs. There is trace bilateral pleural effusion with associated compressive  atelectatic changes in the bilateral lower lobes. There is a heterogeneous peripherally hyperattenuating and centrally hypoattenuating approximately 1.1 x 2.0 cm pleural based nodule in the left lung lower lobe, indeterminate, but unchanged since the prior study from 09/20/2023. Attention on follow-up examination is recommended. Bilateral lungs are otherwise clear. Musculoskeletal: Redemonstration of central left breast mass as well as asymmetric left skin breast skin thickening, grossly similar to the prior study. The visualized soft tissues of the chest wall are otherwise grossly unremarkable. Redemonstration of heterogeneous mixed sclerotic/lytic appearance of the sternal body along with pathological fracture, similar to the prior study. There are mild multilevel degenerative changes in the visualized spine. Review of the MIP images confirms the above findings. CT ABDOMEN and PELVIS FINDINGS Hepatobiliary: The liver is normal in size. Non-cirrhotic configuration. Redemonstration of extensive ill-defined heterogeneous hypoattenuating liver masses occupying approximately 50% of the total liver parenchyma, compatible with metastasis. Comparison with prior study is limited due to lack of intravenous contrast however, overall, there is no significant interval change. The hepatic vein and portal vein tributaries appear compressed however, no discrete thrombosis noted. No intrahepatic or extrahepatic bile duct dilation. Contracted gallbladder. No calcified gallstones. Normal gallbladder wall thickness. No pericholecystic inflammatory changes. Pancreas: Unremarkable. No pancreatic ductal dilatation or surrounding inflammatory changes. Spleen: Within normal limits. No focal lesion. Adrenals/Urinary Tract: Adrenal glands are unremarkable. No suspicious renal mass. Redemonstration of multiple simple cysts scattered throughout bilateral kidneys. No hydroureteronephrosis or nephroureterolithiasis. Urinary bladder is under  distended, precluding optimal assessment. However, no large mass or stones identified. No perivesical fat stranding. Stomach/Bowel: No disproportionate dilation of the small or large bowel loops. No evidence of abnormal bowel wall thickening or inflammatory changes. The appendix is unremarkable. Vascular/Lymphatic: There is small-to-moderate ascites, increased since the prior study. No pneumoperitoneum. No abdominal or pelvic lymphadenopathy, by size criteria. No aneurysmal dilation of the major abdominal arteries. There are moderate peripheral atherosclerotic vascular calcifications of the aorta and its major branches. Reproductive: The uterus is surgically absent. No large adnexal mass. There are heterogeneous soft tissue attenuation areas along the bilateral pelvic sidewalls, which may represent residual ovarian  tissue versus peritoneal carcinomatosis. No significant interval change. Other: There is a tiny fat containing umbilical hernia. There is small to moderate anasarca. Musculoskeletal: No suspicious osseous lesions. There are mild multilevel degenerative changes in the visualized spine. Review of the MIP images confirms the above findings. IMPRESSION: 1. No embolism to the proximal subsegmental pulmonary artery level. 2. There is trace bilateral pleural effusion with associated compressive atelectatic changes in the bilateral lower lobes. 3. Redemonstration of extensive liver metastases, grossly similar to the prior study. 4. There is small-to-moderate ascites, increased since the prior study. 5. Redemonstration of central left breast mass and asymmetric left breast skin thickening, grossly similar to the prior study. 6. Redemonstration of heterogeneous mixed sclerotic/lytic appearance of the sternal body along with pathological fracture, similar to the prior study. 7. Multiple other nonacute observations, as described above. Aortic Atherosclerosis (ICD10-I70.0). Electronically Signed   By: Jules Schick  M.D.   On: 11/07/2023 13:15   DG Chest 2 View Result Date: 11/07/2023 CLINICAL DATA:  Shortness of breath. EXAM: CHEST - 2 VIEW COMPARISON:  09/27/2023. FINDINGS: Low lung volume. Mild diffuse pulmonary vascular congestion, likely accentuated by low lung volume. No frank pulmonary edema. Redemonstration of left retrocardiac airspace opacity obscuring the left hemidiaphragm, descending thoracic aorta and blunting the left lateral costophrenic angle, suggesting combination of left lung atelectasis and/or consolidation with pleural effusion. No significant interval change. Bilateral lung fields are otherwise clear. Right costophrenic angle is clear. Stable mildly enlarged cardio-mediastinal silhouette. Loop recorder device noted overlying the left anterior lower chest wall. No acute osseous abnormalities. The soft tissues are within normal limits. IMPRESSION: *Persistent left retrocardiac opacity, as described above. No significant interval change. Electronically Signed   By: Jules Schick M.D.   On: 11/07/2023 11:23   Scheduled Meds:  amLODipine  10 mg Oral Daily   aspirin EC  81 mg Oral Daily   carvedilol  25 mg Oral BID WC   enoxaparin (LOVENOX) injection  40 mg Subcutaneous Q24H   ferrous sulfate  325 mg Oral Q breakfast   gabapentin  100 mg Oral TID   insulin aspart  0-15 Units Subcutaneous TID WC   insulin aspart  0-5 Units Subcutaneous QHS   Continuous Infusions:   LOS: 1 day    Time spent: 50 mins    Willeen Niece, MD [facility]   If 7PM-7AM, please contact night-coverage

## 2023-11-08 NOTE — Progress Notes (Signed)
    Patient Name: Heather Rogers           DOB: 23-Feb-1946  MRN: [id number]      Admission Date: 11/07/2023  Attending Provider: Magdalene School, MD  Primary Diagnosis: Malignant ascites   Level of care: Med-Surg    CROSS COVER NOTE   Date of Service   11/08/2023   SONAL DORWART, 78 y.o. female, was admitted on 11/07/2023 for Malignant ascites.    HPI/Events of Note   Notified by bedside RN that patient is "nonverbal, but following commands."  Rapid response at bedside.  CBG 80.  Found to be febrile, 105 F via rectal source. Patient had fever previously.  She was started on ceftriaxone for SBP prophylaxis.  Paracentesis specimen was sent for cultures today.  At bedside, patient is awake.  She responds to her name with a clear voice.  Oriented to self only.   No facial asymmetry.  PERRLA.  Weak bilateral grip.  All 4 extremities respond equally to painful stimulation.  Does not follow commands well, but attempts to interact.   Respiratory: Bilaterally clear, no wheezing, no crackles. Normal effort.  Cardiovascular: Regular rate and rhythm. + edema. 2+ pedal pulses.  Abdomen: Abdomen is distended and nontender.  Positive bowel sounds in all quadrants.    Addendum-normothermic after antipyretic med use and ice packs.  Per RN, patient is more interactive and verbal.   Interventions/ Plan   Tylenol rectal BC, UC, CBC, lactic acid, ammonia Ice packs        Bailey Lesser, DNP, ACNPC- AG [facility]

## 2023-11-08 NOTE — Plan of Care (Signed)

## 2023-11-08 NOTE — Progress Notes (Signed)
 Rapid Response Event Note   Reason for Call : pt need NIH scale   Initial Focused Assessment: Pt lethargic, but will arouse to stimulation.  Pt is oriented x 1.  Per her daughter her orientation status is not new, just her LOC.  Pt has a history of a CVA (left).  Unable to get pt to follow commands.  Pt feels very warm to touch, rectal temperature of 105.  Pt pupils intact.  Pulses palpable.  Lungs decreased.  Obese abdomen.  Pt daughter at the bedside and answering questions.  Pt has an extensive health history see chart for details.    Interventions: [facility], NP at bedside orders received and initiated.  Pt has a CBG 80. Ice packs placed on pt.  See MAR for meds to be given.  VS in flowsheet.     Plan of Care: Pt will remain in current location per [facility],NP.  Will recheck pt temperature in 1 hour after current interventions to see if there is an improvement.  Pt primary RN aware and will report.  Event Summary:   MD Notified: yes Call Time: 2026 Arrival Time: 2028 End Time: 2100  Conley Rolls, RN

## 2023-11-08 NOTE — Progress Notes (Signed)
   11/08/23 2000  Assess: MEWS Score  Temp (!) 103 F (39.4 C)  BP (!) 151/61  MAP (mmHg) 88  Pulse Rate 87  Resp 18  SpO2 94 %  Assess: MEWS Score  MEWS Temp 2  MEWS Systolic 0  MEWS Pulse 0  MEWS RR 0  MEWS LOC 0  MEWS Score 2  MEWS Score Color Yellow  Assess: if the MEWS score is Yellow or Red  Were vital signs accurate and taken at a resting state? Yes  Does the patient meet 2 or more of the SIRS criteria? No  MEWS guidelines implemented  Yes, yellow  Treat  MEWS Interventions Considered administering scheduled or prn medications/treatments as ordered  Take Vital Signs  Increase Vital Sign Frequency  Yellow: Q2hr x1, continue Q4hrs until patient remains green for 12hrs  Escalate  MEWS: Escalate Yellow: Discuss with charge nurse and consider notifying provider and/or RRT  Notify: Charge Nurse/RN  Name of Charge Nurse/RN Notified Renita, rn  Provider Notification  Provider Name/Title Liana Crocker, np  Date Provider Notified 11/08/23  Time Provider Notified 2014  Method of Notification Page (secure chat)  Notification Reason Change in status  Provider response En route;See new orders  Date of Provider Response 11/08/23  Time of Provider Response 2030  Notify: Rapid Response  Name of Rapid Response RN Notified genell, rn  Date Rapid Response Notified 11/08/23  Time Rapid Response Notified 2026  Assess: SIRS CRITERIA  SIRS Temperature  1  SIRS Respirations  0  SIRS Pulse 0  SIRS WBC 0  SIRS Score Sum  1   Patient's daughter and came out in the hall and told nurse tech patient was non verbal and that was a change from baseline. This RN and charge RN went to bedside to assess patient. Patient is alert, non verbal and is following commands intermittently but exhibiting some weakness on left side, patient has a h/o stroke in 2019. Temp is 103 rectally, CBG-80, and a bladder scan showed 123 cc. This RN notified the provider and rapid response, see new orders.

## 2023-11-09 DIAGNOSIS — C7981 Secondary malignant neoplasm of breast: Secondary | ICD-10-CM | POA: Diagnosis not present

## 2023-11-09 DIAGNOSIS — R180 Malignant ascites: Secondary | ICD-10-CM | POA: Diagnosis not present

## 2023-11-09 LAB — COMPREHENSIVE METABOLIC PANEL WITH GFR
ALT: 123 U/L — ABNORMAL HIGH (ref 0–44)
AST: 389 U/L — ABNORMAL HIGH (ref 15–41)
Albumin: 2.3 g/dL — ABNORMAL LOW (ref 3.5–5.0)
Alkaline Phosphatase: 220 U/L — ABNORMAL HIGH (ref 38–126)
Anion gap: 11 (ref 5–15)
BUN: 36 mg/dL — ABNORMAL HIGH (ref 8–23)
CO2: 19 mmol/L — ABNORMAL LOW (ref 22–32)
Calcium: 11.1 mg/dL — ABNORMAL HIGH (ref 8.9–10.3)
Chloride: 106 mmol/L (ref 98–111)
Creatinine, Ser: 1.33 mg/dL — ABNORMAL HIGH (ref 0.44–1.00)
GFR, Estimated: 41 mL/min — ABNORMAL LOW (ref >60)
Glucose, Bld: 122 mg/dL — ABNORMAL HIGH (ref 70–99)
Potassium: 4.9 mmol/L (ref 3.5–5.1)
Sodium: 136 mmol/L (ref 135–145)
Total Bilirubin: 1.4 mg/dL — ABNORMAL HIGH (ref 0.0–1.2)
Total Protein: 6.3 g/dL — ABNORMAL LOW (ref 6.5–8.1)

## 2023-11-09 LAB — GLUCOSE, CAPILLARY
Glucose-Capillary: 109 mg/dL — ABNORMAL HIGH (ref 70–99)
Glucose-Capillary: 110 mg/dL — ABNORMAL HIGH (ref 70–99)
Glucose-Capillary: 110 mg/dL — ABNORMAL HIGH (ref 70–99)
Glucose-Capillary: 145 mg/dL — ABNORMAL HIGH (ref 70–99)
Glucose-Capillary: 201 mg/dL — ABNORMAL HIGH (ref 70–99)

## 2023-11-09 LAB — CBC
HCT: 28 % — ABNORMAL LOW (ref 36.0–46.0)
Hemoglobin: 8.8 g/dL — ABNORMAL LOW (ref 12.0–15.0)
MCH: 25.6 pg — ABNORMAL LOW (ref 26.0–34.0)
MCHC: 31.4 g/dL (ref 30.0–36.0)
MCV: 81.4 fL (ref 80.0–100.0)
Platelets: 251 10*3/uL (ref 150–400)
RBC: 3.44 MIL/uL — ABNORMAL LOW (ref 3.87–5.11)
RDW: 17.2 % — ABNORMAL HIGH (ref 11.5–15.5)
WBC: 11.5 10*3/uL — ABNORMAL HIGH (ref 4.0–10.5)
nRBC: 0.2 % (ref 0.0–0.2)

## 2023-11-09 LAB — MAGNESIUM: Magnesium: 2.4 mg/dL (ref 1.7–2.4)

## 2023-11-09 LAB — PHOSPHORUS: Phosphorus: 2.4 mg/dL — ABNORMAL LOW (ref 2.5–4.6)

## 2023-11-09 MED ORDER — KETOROLAC TROMETHAMINE 15 MG/ML IJ SOLN
15.0000 mg | Freq: Once | INTRAMUSCULAR | Status: AC
  Administered 2023-11-09: 15 mg via INTRAVENOUS
  Filled 2023-11-09: qty 1

## 2023-11-09 NOTE — Progress Notes (Signed)
 MEWS Progress Note  Patient Details Name: Heather Rogers MRN: [id number] DOB: [birth year] Today's Date: 11/09/2023   MEWS Flowsheet Documentation:  Assess: MEWS Score Temp: (!) 103 F (39.4 C) BP: 121/67 MAP (mmHg): 74 Pulse Rate: 85 ECG Heart Rate: 83 Resp: (!) 22 Level of Consciousness: Alert SpO2: 95 % O2 Device: Room Air Patient Activity (if Appropriate): In bed Assess: MEWS Score MEWS Temp: 2 MEWS Systolic: 0 MEWS Pulse: 0 MEWS RR: 1 MEWS LOC: 0 MEWS Score: 3 MEWS Score Color: Yellow Assess: SIRS CRITERIA SIRS Temperature : 1 SIRS Respirations : 1 SIRS Pulse: 0 SIRS WBC: 0 SIRS Score Sum : 2 SIRS Temperature : 1 SIRS Pulse: 0 SIRS Respirations : 1 SIRS WBC: 0 SIRS Score Sum : 2 Assess: if the MEWS score is Yellow or Red Were vital signs accurate and taken at a resting state?: Yes Does the patient meet 2 or more of the SIRS criteria?: No MEWS guidelines implemented : Yes, yellow Treat MEWS Interventions: Considered administering scheduled or prn medications/treatments as ordered Take Vital Signs Increase Vital Sign Frequency : Yellow: Q2hr x1, continue Q4hrs until patient remains green for 12hrs Escalate MEWS: Escalate: Yellow: Discuss with charge nurse and consider notifying provider and/or RRT Notify: Charge Nurse/RN Name of Charge Nurse/RN Notified: Trevor Fudge rn Provider Notification Provider Name/Title: Laurence Pons, np Date Provider Notified: 11/09/23 Time Provider Notified: 2213 Method of Notification: Page (secure chat) Notification Reason: Other (Comment) (elevated temp) Provider response: No new orders Date of Provider Response: 11/09/23 Time of Provider Response: 2220      Hazen Liter 11/09/2023, 10:36 PM

## 2023-11-09 NOTE — Plan of Care (Signed)

## 2023-11-09 NOTE — Progress Notes (Signed)
 Patient became more alert as the shift progressed, alert and oriented x 2, following commands, medicated x 1 for increased temp, rectal temp more reliable for this patient will pass on to oncoming, medicated x 1 for nausea with IV Zofran with eff results, also bladder scanned due to patient requesting the toilet and did not void, she ultimately voided twice and was able to drink x 1 supplement, tolerated IV ATB, denies pain, in bed resting, call light in reach

## 2023-11-09 NOTE — Progress Notes (Signed)
 PROGRESS NOTE    Heather Rogers  ZOX:[id number] DOB: [birth year] DOA: 11/07/2023 PCP: Joaquin Mulberry, MD   Brief Narrative:  This 78 y.o. female with medical history significant for hypertension, metastatic breast cancer, diabetes, CKD stage IIIb presented to the hospital with symptomatic abdominal ascites.  Patient was recently hospitalized on 10/06/23 with C. difficile colitis during which she was noted to have abdominal ascites.  Over the course of the last month patient has gradual worsening of her ascites with associated shortness of breath with exertion and intermittent abdominal pain.  She has been taking her diuretics since hospital discharge but in fact her lower extremity edema is much improved from prior.  Workup in the ED reveals evidence of moderate abdominal ascites.  Patient is admitted for further evaluation.  Patient is going to have paracentesis by IR today.  Assessment & Plan:   Principal Problem:   Malignant ascites  Abdominal Ascites - presumably malignant. This is likely the cause of her dyspnea and discomfort.   She has minimal abdominal pain, denies any fever, Initially low index of suspicion for SBP. IR consulted for diagnostic and therapeutic paracentesis,  appropriate lab studies ordered. She now has spiked fever, starting ceftriaxone for SBP prophylaxis. Ascitic fluid negative for SBP.  Continued to spike fevers.  Continue ceftriaxone for now.  Metastatic breast cancer: She follows with Dr. Lee Public. Patient is going to start chemotherapy next week.  Iron deficiency Anemia:  Continue oral iron supplementation.   Hypertension: Continue home amlodipine.   Type 2 diabetes: Almost  well-controlled, carb modified diet with sliding scale insulin   Hypercalcemia:  Likely in the setting of cancer. Continue IV hydration.  Continue to monitor.  Acute kidney injury : Likely prerenal. Continue IV hydration. Avoid nephrotoxic   DVT prophylaxis: Lovenox Code  Status: Full code Family Communication: No family at bedside Disposition Plan:    Status is: Inpatient Remains inpatient appropriate because: Admitted for abdominal ascites,  symptomatic and requiring IR paracentesis.   Consultants:  Oncology  Procedures: None Antimicrobials:  Anti-infectives (From admission, onward)    Start     Dose/Rate Route Frequency Ordered Stop   11/08/23 1400  cefTRIAXone (ROCEPHIN) 2 g in sodium chloride 0.9 % 100 mL IVPB        2 g 200 mL/hr over 30 Minutes Intravenous Every 24 hours 11/08/23 1258         Subjective: Patient was seen and examined at bedside. Overnight events noted.   Patient reports feeling better,  reports pain has improved. She underwent paracentesis. She spiked fever yesterday.  Objective: Vitals:   11/09/23 0123 11/09/23 0527 11/09/23 0948 11/09/23 1227  BP: (!) 111/52 (!) 136/54 (!) 154/51 (!) 142/59  Pulse: 64 71 81 73  Resp: 20 18 18  (!) 22  Temp: 98.3 F (36.8 C) 98.9 F (37.2 C) (!) 100.6 F (38.1 C) 99.3 F (37.4 C)  TempSrc: Oral Oral Oral Oral  SpO2: 95% 95% 99% 97%  Weight:      Height:        Intake/Output Summary (Last 24 hours) at 11/09/2023 1426 Last data filed at 11/09/2023 0942 Gross per 24 hour  Intake 550.09 ml  Output 300 ml  Net 250.09 ml   Filed Weights   11/07/23 0956 11/07/23 1704  Weight: 70.8 kg 71 kg    Examination:  General exam: Appears calm , comfortable, deconditioned, sick looking, not in any acute distress. Respiratory system: CTA bilaterally. Respiratory effort normal.  RR 16. Cardiovascular system:  S1 & S2 heard, RRR. No JVD, murmurs, rubs, gallops or clicks.  Gastrointestinal system: Abdomen is moderately distended, soft and non tender. Normal bowel sounds heard. Central nervous system: Alert and oriented x 3. No focal neurological deficits. Extremities: Edema+, no cyanosis, no clubbing Skin: No rashes, lesions or ulcers Psychiatry: Judgement and insight appear normal. Mood  & affect appropriate.     Data Reviewed: I have personally reviewed following labs and imaging studies  CBC: Recent Labs  Lab 11/07/23 1116 11/08/23 0454 11/08/23 2143 11/09/23 1319  WBC 12.9* 11.1* 10.2 11.5*  NEUTROABS 9.7*  --   --   --   HGB 9.1* 8.3* 9.3* 8.8*  HCT 28.2* 26.7* 28.8* 28.0*  MCV 80.3 83.4 80.9 81.4  PLT 300 229 271 251   Basic Metabolic Panel: Recent Labs  Lab 11/07/23 1116 11/08/23 0454 11/09/23 1319  NA 133* 135 136  K 4.6 4.3 4.9  CL 103 108 106  CO2 22 20* 19*  GLUCOSE 161* 87 122*  BUN 27* 25* 36*  CREATININE 0.94 1.14* 1.33*  CALCIUM 11.5* 11.6* 11.1*  MG  --   --  2.4  PHOS  --   --  2.4*   GFR: Estimated Creatinine Clearance: 32.2 mL/min (A) (by C-G formula based on SCr of 1.33 mg/dL (H)). Liver Function Tests: Recent Labs  Lab 11/07/23 1116 11/08/23 0454 11/09/23 1319  AST 203* 177* 389*  ALT 85* 73* 123*  ALKPHOS 239* 186* 220*  BILITOT 1.2 1.5* 1.4*  PROT 7.2 6.0* 6.3*  ALBUMIN 2.8* 2.4* 2.3*   No results for input(s): "LIPASE", "AMYLASE" in the last 168 hours. Recent Labs  Lab 11/08/23 2143  AMMONIA 26   Coagulation Profile: No results for input(s): "INR", "PROTIME" in the last 168 hours. Cardiac Enzymes: No results for input(s): "CKTOTAL", "CKMB", "CKMBINDEX", "TROPONINI" in the last 168 hours. BNP (last 3 results) No results for input(s): "PROBNP" in the last 8760 hours. HbA1C: No results for input(s): "HGBA1C" in the last 72 hours. CBG: Recent Labs  Lab 11/08/23 1603 11/08/23 1957 11/09/23 0033 11/09/23 0735 11/09/23 1147  GLUCAP 84 80 145* 110* 109*   Lipid Profile: No results for input(s): "CHOL", "HDL", "LDLCALC", "TRIG", "CHOLHDL", "LDLDIRECT" in the last 72 hours. Thyroid Function Tests: No results for input(s): "TSH", "T4TOTAL", "FREET4", "T3FREE", "THYROIDAB" in the last 72 hours. Anemia Panel: No results for input(s): "VITAMINB12", "FOLATE", "FERRITIN", "TIBC", "IRON", "RETICCTPCT" in the last  72 hours. Sepsis Labs: Recent Labs  Lab 11/08/23 2143  LATICACIDVEN 1.2    Recent Results (from the past 240 hours)  Body fluid culture w Gram Stain     Status: None (Preliminary result)   Collection Time: 11/08/23  2:52 PM   Specimen: PATH Cytology Pleural fluid  Result Value Ref Range Status   Specimen Description   Final    PLEURAL Performed at [facility], [address] [place], [place] [address]    Special Requests   Final    NONE Performed at [facility], [address], [place], [place] [address]    Gram Stain   Final    RARE WBC PRESENT,BOTH PMN AND MONONUCLEAR NO ORGANISMS SEEN    Culture   Final    NO GROWTH < 24 HOURS Performed at [facility], [address] [place] [address], [place], [place] [address]    Report Status PENDING  Incomplete  Culture, blood (Routine X 2) w Reflex to ID Panel     Status: None (  Preliminary result)   Collection Time: 11/08/23  9:43 PM   Specimen: BLOOD LEFT HAND  Result Value Ref Range Status   Specimen Description   Final    BLOOD LEFT HAND Performed at [facility], [address], [place], [place] [address]    Special Requests   Final    BOTTLES DRAWN AEROBIC AND ANAEROBIC Blood Culture results may not be optimal due to an inadequate volume of blood received in culture bottles Performed at [facility], [address] [place], [place] [address]    Culture   Final    NO GROWTH < 12 HOURS Performed at [facility], [address], [place], [place] [address]    Report Status PENDING  Incomplete  Culture, blood (Routine X 2) w Reflex to ID Panel     Status: None (Preliminary result)   Collection Time: 11/08/23  9:43 PM   Specimen: BLOOD LEFT HAND  Result Value Ref Range Status   Specimen Description   Final    BLOOD LEFT HAND Performed at [facility], [address], [place], [place] [address]    Special Requests   Final    BOTTLES DRAWN AEROBIC AND  ANAEROBIC Blood Culture results may not be optimal due to an inadequate volume of blood received in culture bottles Performed at [facility], [address] [place], [place] [address]    Culture   Final    NO GROWTH < 12 HOURS Performed at [facility], [address] [place], [place] [address]    Report Status PENDING  Incomplete    Radiology Studies: US  Paracentesis Result Date: 11/08/2023 INDICATION: Patient with history of primary breast cancer with metastasis to the liver, malignant ascites. IR consulted for diagnostic and therapeutic paracentesis. EXAM: ULTRASOUND GUIDED DIAGNOSTIC AND THERAPEUTIC PARACENTESIS MEDICATIONS: 10 mL 1% lidocaine COMPLICATIONS: None immediate. PROCEDURE: Informed written consent was obtained from the patient after a discussion of the risks, benefits and alternatives to treatment. A timeout was performed prior to the initiation of the procedure. Initial ultrasound scanning demonstrates a moderate amount of ascites within the right lower abdominal quadrant. The right lower abdomen was prepped and draped in the usual sterile fashion. 1% lidocaine was used for local anesthesia. Following this, a 19 gauge, 7-cm, Yueh catheter was introduced. An ultrasound image was saved for documentation purposes. The paracentesis was performed. The catheter was removed and a dressing was applied. The patient tolerated the procedure well without immediate post procedural complication. FINDINGS: A total of approximately 1.3 liters of yellow fluid was removed. Samples were sent to the laboratory as requested by the clinical team. IMPRESSION: Successful ultrasound-guided paracentesis yielding 1.3 liters of peritoneal fluid. Performed by: Wyatt Pommier, PA-C Electronically Signed   By: Erica Hau M.D.   On: 11/08/2023 14:53   Scheduled Meds:  amLODipine  10 mg Oral Daily   aspirin EC  81 mg Oral Daily   carvedilol  25 mg Oral BID WC   enoxaparin (LOVENOX)  injection  40 mg Subcutaneous Q24H   feeding supplement  237 mL Oral BID BM   ferrous sulfate  325 mg Oral Q breakfast   gabapentin  100 mg Oral TID   insulin aspart  0-15 Units Subcutaneous TID WC   insulin aspart  0-5 Units Subcutaneous QHS   Continuous Infusions:  cefTRIAXone (ROCEPHIN)  IV 2 g (11/09/23 1305)     LOS: 2 days    Time spent:  35 mins    Magdalene School, MD [facility]   If 7PM-7AM, please contact night-coverage

## 2023-11-10 ENCOUNTER — Inpatient Hospital Stay (HOSPITAL_COMMUNITY)

## 2023-11-10 DIAGNOSIS — C7981 Secondary malignant neoplasm of breast: Secondary | ICD-10-CM | POA: Diagnosis not present

## 2023-11-10 DIAGNOSIS — R180 Malignant ascites: Secondary | ICD-10-CM | POA: Diagnosis not present

## 2023-11-10 LAB — CBC
HCT: 27.9 % — ABNORMAL LOW (ref 36.0–46.0)
Hemoglobin: 8.8 g/dL — ABNORMAL LOW (ref 12.0–15.0)
MCH: 26 pg (ref 26.0–34.0)
MCHC: 31.5 g/dL (ref 30.0–36.0)
MCV: 82.3 fL (ref 80.0–100.0)
Platelets: 185 10*3/uL (ref 150–400)
RBC: 3.39 MIL/uL — ABNORMAL LOW (ref 3.87–5.11)
RDW: 17.3 % — ABNORMAL HIGH (ref 11.5–15.5)
WBC: 12.6 10*3/uL — ABNORMAL HIGH (ref 4.0–10.5)
nRBC: 0.6 % — ABNORMAL HIGH (ref 0.0–0.2)

## 2023-11-10 LAB — COMPREHENSIVE METABOLIC PANEL WITH GFR
ALT: 130 U/L — ABNORMAL HIGH (ref 0–44)
AST: 351 U/L — ABNORMAL HIGH (ref 15–41)
Albumin: 2.1 g/dL — ABNORMAL LOW (ref 3.5–5.0)
Alkaline Phosphatase: 227 U/L — ABNORMAL HIGH (ref 38–126)
Anion gap: 7 (ref 5–15)
BUN: 34 mg/dL — ABNORMAL HIGH (ref 8–23)
CO2: 20 mmol/L — ABNORMAL LOW (ref 22–32)
Calcium: 11.5 mg/dL — ABNORMAL HIGH (ref 8.9–10.3)
Chloride: 111 mmol/L (ref 98–111)
Creatinine, Ser: 1.41 mg/dL — ABNORMAL HIGH (ref 0.44–1.00)
GFR, Estimated: 38 mL/min — ABNORMAL LOW (ref >60)
Glucose, Bld: 129 mg/dL — ABNORMAL HIGH (ref 70–99)
Potassium: 4.5 mmol/L (ref 3.5–5.1)
Sodium: 138 mmol/L (ref 135–145)
Total Bilirubin: 1.6 mg/dL — ABNORMAL HIGH (ref 0.0–1.2)
Total Protein: 5.7 g/dL — ABNORMAL LOW (ref 6.5–8.1)

## 2023-11-10 LAB — GLUCOSE, CAPILLARY
Glucose-Capillary: 140 mg/dL — ABNORMAL HIGH (ref 70–99)
Glucose-Capillary: 133 mg/dL — ABNORMAL HIGH (ref 70–99)
Glucose-Capillary: 130 mg/dL — ABNORMAL HIGH (ref 70–99)
Glucose-Capillary: 114 mg/dL — ABNORMAL HIGH (ref 70–99)

## 2023-11-10 MED ORDER — SODIUM CHLORIDE 0.9 % IV SOLN
3.0000 g | Freq: Three times a day (TID) | INTRAVENOUS | Status: DC
  Administered 2023-11-10 – 2023-11-17 (×22): 3 g via INTRAVENOUS
  Filled 2023-11-10 (×22): qty 8

## 2023-11-10 MED ORDER — SODIUM CHLORIDE 0.9 % IV SOLN: INTRAVENOUS | Status: AC

## 2023-11-10 NOTE — Progress Notes (Signed)
 Paged provider  patient is more alert now but she is retaining/holding urine not sure which one but not following command to void when placed on BSC or primafit, did in and out , got 500 ml dark amber urine foul odor but no sediment, she is answering questions and telling us  she has to void but unable to complete the act, temp is down all day no tylenol needed no further vomiting since dose of zofran this morning, she only has  Coreg 25 mg left but her BPs are soft, SBP low 100 and DBP 40-60 for the last 2 days so we could put it on hold if you want otherwise she has gabapentin and s/s insulin, please advise and thankis   Awaiting response

## 2023-11-10 NOTE — Plan of Care (Signed)

## 2023-11-10 NOTE — Progress Notes (Signed)
 Messaged provider  patient vomited again and it appeared to be bile she had paracentesis on Friday they took off 1.5 L maybe she needs again? also do you think she should be NPO sips with meds had we not been in there she may have aspirated she is so lethargic that she did not even turn her head thanks   See new orders re: ATB, IV fluid CXR and labs

## 2023-11-10 NOTE — Progress Notes (Signed)
 No further vomiting this shift after x 1 dose of Zofran, temp WNL all shift, patient denies pain, staff turn from side to side and place pillow  in between legs to offset heaviness of abdomen,. Daughter Heather Rogers at bedside, updated on new POC

## 2023-11-10 NOTE — Progress Notes (Signed)
 Per provider, hold po meds until she is assessed tomorrow

## 2023-11-10 NOTE — Progress Notes (Signed)
 PROGRESS NOTE    Heather Rogers  ZHY:[id number] DOB: 1945-08-14 DOA: 11/07/2023 PCP: Joaquin Mulberry, MD   Brief Narrative:  This 78 y.o. female with medical history significant for hypertension, metastatic breast cancer, diabetes, CKD stage IIIb presented to the hospital with symptomatic abdominal ascites.  Patient was recently hospitalized on 10/06/23 with C. difficile colitis during which she was noted to have abdominal ascites.  Over the course of the last month patient has gradual worsening of her ascites with associated shortness of breath with exertion and intermittent abdominal pain.  She has been taking her diuretics since hospital discharge but in fact her lower extremity edema is much improved from prior.  Workup in the ED reveals evidence of moderate abdominal ascites.  Patient is admitted for further evaluation.  Patient is going to have paracentesis by IR today.  Assessment & Plan:   Principal Problem:   Malignant ascites  Abdominal Ascites - presumably malignant. This is likely the cause of her dyspnea and discomfort.   She has minimal abdominal pain, denies any fever, Initially low index of suspicion for SBP. IR consulted for diagnostic and therapeutic paracentesis,  appropriate lab studies ordered. She now has spiked fever, starting ceftriaxone for SBP prophylaxis. Ascitic fluid negative for SBP.  Continued to spike fevers.  Continue ceftriaxone for now. Patient continued to spike fevers. Antibiotics changed to Unasyn for possible aspiration. Chest x-ray ordered.  Fever could be related to malignancy.  Metastatic breast cancer: She follows with Dr. Lee Public. Patient is going to start chemotherapy next week.  Iron deficiency Anemia:  Continue oral iron supplementation.   Hypertension: Continue home amlodipine.   Type 2 diabetes: Almost  well-controlled, carb modified diet with sliding scale insulin   Hypercalcemia:  Likely in the setting of cancer. Continue IV  hydration.  Continue to monitor.  Acute kidney injury : Likely prerenal. Continue IV hydration. Avoid nephrotoxic medications  Fever ? Patient continued to spike fever despite on ceftriaxone. Fever could be related to malignancy, aspiration. Antibiotic changed to Unasyn.  Follow-up blood cultures urine cultures.  DVT prophylaxis: Lovenox Code Status: Full code Family Communication: No family at bedside Disposition Plan:    Status is: Inpatient Remains inpatient appropriate because: Admitted for abdominal ascites,  symptomatic and requiring IR paracentesis.   Consultants:  Oncology  Procedures: None Antimicrobials:  Anti-infectives (From admission, onward)    Start     Dose/Rate Route Frequency Ordered Stop   11/10/23 1130  Ampicillin-Sulbactam (UNASYN) 3 g in sodium chloride 0.9 % 100 mL IVPB        3 g 200 mL/hr over 30 Minutes Intravenous Every 8 hours 11/10/23 1034     11/08/23 1400  cefTRIAXone (ROCEPHIN) 2 g in sodium chloride 0.9 % 100 mL IVPB  Status:  Discontinued        2 g 200 mL/hr over 30 Minutes Intravenous Every 24 hours 11/08/23 1258 11/10/23 1030       Subjective: Patient was seen and examined at bedside. Overnight events noted.   Patient reports feeling better,  reports pain has improved. She underwent paracentesis. She continued to spike fevers despite on ceftriaxone. Antibiotics changed to Unasyn for possible aspiration   Objective: Vitals:   11/10/23 0645 11/10/23 0736 11/10/23 1147 11/10/23 1149  BP:  (!) 115/52 (!) 119/47 (!) 109/51  Pulse:  82 74 72  Resp:  20 (!) 24   Temp: (!) 101 F (38.3 C) 98.7 F (37.1 C) 99.5 F (37.5 C)   TempSrc: Rectal  Oral Oral   SpO2:  90% 97%   Weight:      Height:        Intake/Output Summary (Last 24 hours) at 11/10/2023 1227 Last data filed at 11/09/2023 1717 Gross per 24 hour  Intake 327 ml  Output --  Net 327 ml   Filed Weights   11/07/23 0956 11/07/23 1704  Weight: 70.8 kg 71 kg     Examination:  General exam: Appears comfortable, deconditioned, sick looking, not in any acute distress. Respiratory system: CTA bilaterally. Respiratory effort normal.  RR 16. Cardiovascular system: S1 & S2 heard, RRR. No JVD, murmurs, rubs, gallops or clicks.  Gastrointestinal system: Abdomen is moderately distended, soft and non tender. Normal bowel sounds heard. Central nervous system: Alert and oriented x 2. No focal neurological deficits. Extremities: Edema+, no cyanosis, no clubbing Skin: No rashes, lesions or ulcers Psychiatry: Judgement and insight appear normal. Mood & affect appropriate.     Data Reviewed: I have personally reviewed following labs and imaging studies  CBC: Recent Labs  Lab 11/07/23 1116 11/08/23 0454 11/08/23 2143 11/09/23 1319 11/10/23 0809  WBC 12.9* 11.1* 10.2 11.5* 12.6*  NEUTROABS 9.7*  --   --   --   --   HGB 9.1* 8.3* 9.3* 8.8* 8.8*  HCT 28.2* 26.7* 28.8* 28.0* 27.9*  MCV 80.3 83.4 80.9 81.4 82.3  PLT 300 229 271 251 185   Basic Metabolic Panel: Recent Labs  Lab 11/07/23 1116 11/08/23 0454 11/09/23 1319 11/10/23 0809  NA 133* 135 136 138  K 4.6 4.3 4.9 4.5  CL 103 108 106 111  CO2 22 20* 19* 20*  GLUCOSE 161* 87 122* 129*  BUN 27* 25* 36* 34*  CREATININE 0.94 1.14* 1.33* 1.41*  CALCIUM 11.5* 11.6* 11.1* 11.5*  MG  --   --  2.4  --   PHOS  --   --  2.4*  --    GFR: Estimated Creatinine Clearance: 30.4 mL/min (A) (by C-G formula based on SCr of 1.41 mg/dL (H)). Liver Function Tests: Recent Labs  Lab 11/07/23 1116 11/08/23 0454 11/09/23 1319 11/10/23 0809  AST 203* 177* 389* 351*  ALT 85* 73* 123* 130*  ALKPHOS 239* 186* 220* 227*  BILITOT 1.2 1.5* 1.4* 1.6*  PROT 7.2 6.0* 6.3* 5.7*  ALBUMIN 2.8* 2.4* 2.3* 2.1*   No results for input(s): "LIPASE", "AMYLASE" in the last 168 hours. Recent Labs  Lab 11/08/23 2143  AMMONIA 26   Coagulation Profile: No results for input(s): "INR", "PROTIME" in the last 168  hours. Cardiac Enzymes: No results for input(s): "CKTOTAL", "CKMB", "CKMBINDEX", "TROPONINI" in the last 168 hours. BNP (last 3 results) No results for input(s): "PROBNP" in the last 8760 hours. HbA1C: No results for input(s): "HGBA1C" in the last 72 hours. CBG: Recent Labs  Lab 11/09/23 1147 11/09/23 1706 11/09/23 2115 11/10/23 0739 11/10/23 1152  GLUCAP 109* 110* 201* 140* 133*   Lipid Profile: No results for input(s): "CHOL", "HDL", "LDLCALC", "TRIG", "CHOLHDL", "LDLDIRECT" in the last 72 hours. Thyroid Function Tests: No results for input(s): "TSH", "T4TOTAL", "FREET4", "T3FREE", "THYROIDAB" in the last 72 hours. Anemia Panel: No results for input(s): "VITAMINB12", "FOLATE", "FERRITIN", "TIBC", "IRON", "RETICCTPCT" in the last 72 hours. Sepsis Labs: Recent Labs  Lab 11/08/23 2143  LATICACIDVEN 1.2    Recent Results (from the past 240 hours)  Body fluid culture w Gram Stain     Status: None (Preliminary result)   Collection Time: 11/08/23  2:52 PM   Specimen:  PATH Cytology Pleural fluid  Result Value Ref Range Status   Specimen Description   Final    PLEURAL Performed at [facility], [address] [place], [place] [address]    Special Requests   Final    NONE Performed at [facility], [address] [place], [place] [address]    Gram Stain   Final    RARE WBC PRESENT,BOTH PMN AND MONONUCLEAR NO ORGANISMS SEEN    Culture   Final    NO GROWTH 2 DAYS Performed at [facility], [address], [place], [place] [address]    Report Status PENDING  Incomplete  Culture, blood (Routine X 2) w Reflex to ID Panel     Status: None (Preliminary result)   Collection Time: 11/08/23  9:43 PM   Specimen: BLOOD LEFT HAND  Result Value Ref Range Status   Specimen Description   Final    BLOOD LEFT HAND Performed at [facility], [address] [place], [place] [address]    Special Requests   Final    BOTTLES DRAWN  AEROBIC AND ANAEROBIC Blood Culture results may not be optimal due to an inadequate volume of blood received in culture bottles Performed at [facility], [address], [place], [place] [address]    Culture   Final    NO GROWTH 2 DAYS Performed at [facility], [address], [place], [place] [address]    Report Status PENDING  Incomplete  Culture, blood (Routine X 2) w Reflex to ID Panel     Status: None (Preliminary result)   Collection Time: 11/08/23  9:43 PM   Specimen: BLOOD LEFT HAND  Result Value Ref Range Status   Specimen Description   Final    BLOOD LEFT HAND Performed at [facility], [address], [place], [place] [address]    Special Requests   Final    BOTTLES DRAWN AEROBIC AND ANAEROBIC Blood Culture results may not be optimal due to an inadequate volume of blood received in culture bottles Performed at [facility], [address] [place], [place] [address]    Culture   Final    NO GROWTH 2 DAYS Performed at [facility], [address] [place], [place] [address]    Report Status PENDING  Incomplete    Radiology Studies: US  Paracentesis Result Date: 11/08/2023 INDICATION: Patient with history of primary breast cancer with metastasis to the liver, malignant ascites. IR consulted for diagnostic and therapeutic paracentesis. EXAM: ULTRASOUND GUIDED DIAGNOSTIC AND THERAPEUTIC PARACENTESIS MEDICATIONS: 10 mL 1% lidocaine COMPLICATIONS: None immediate. PROCEDURE: Informed written consent was obtained from the patient after a discussion of the risks, benefits and alternatives to treatment. A timeout was performed prior to the initiation of the procedure. Initial ultrasound scanning demonstrates a moderate amount of ascites within the right lower abdominal quadrant. The right lower abdomen was prepped and draped in the usual sterile fashion. 1% lidocaine was used for local anesthesia. Following this, a 19 gauge, 7-cm,  Yueh catheter was introduced. An ultrasound image was saved for documentation purposes. The paracentesis was performed. The catheter was removed and a dressing was applied. The patient tolerated the procedure well without immediate post procedural complication. FINDINGS: A total of approximately 1.3 liters of yellow fluid was removed. Samples were sent to the laboratory as requested by the clinical team. IMPRESSION: Successful ultrasound-guided paracentesis yielding 1.3 liters of peritoneal fluid. Performed  by: Ashley Blades, PA-C Electronically Signed   By: Erica Hau M.D.   On: 11/08/2023 14:53   Scheduled Meds:  amLODipine  10 mg Oral Daily   aspirin EC  81 mg Oral Daily   carvedilol  25 mg Oral BID WC   enoxaparin (LOVENOX) injection  40 mg Subcutaneous Q24H   feeding supplement  237 mL Oral BID BM   ferrous sulfate  325 mg Oral Q breakfast   gabapentin  100 mg Oral TID   insulin aspart  0-15 Units Subcutaneous TID WC   insulin aspart  0-5 Units Subcutaneous QHS   Continuous Infusions:  sodium chloride 40 mL/hr at 11/10/23 1043   ampicillin-sulbactam (UNASYN) IV 3 g (11/10/23 1210)     LOS: 3 days    Time spent: 50 mins    Magdalene School, MD [facility]   If 7PM-7AM, please contact night-coverage

## 2023-11-11 ENCOUNTER — Inpatient Hospital Stay: Admitting: Pharmacist

## 2023-11-11 ENCOUNTER — Inpatient Hospital Stay

## 2023-11-11 DIAGNOSIS — R180 Malignant ascites: Secondary | ICD-10-CM | POA: Diagnosis not present

## 2023-11-11 LAB — CBC
HCT: 34.5 % — ABNORMAL LOW (ref 36.0–46.0)
Hemoglobin: 10.5 g/dL — ABNORMAL LOW (ref 12.0–15.0)
MCH: 25.7 pg — ABNORMAL LOW (ref 26.0–34.0)
MCHC: 30.4 g/dL (ref 30.0–36.0)
MCV: 84.6 fL (ref 80.0–100.0)
Platelets: 194 10*3/uL (ref 150–400)
RBC: 4.08 MIL/uL (ref 3.87–5.11)
RDW: 17.7 % — ABNORMAL HIGH (ref 11.5–15.5)
WBC: 19.4 10*3/uL — ABNORMAL HIGH (ref 4.0–10.5)
nRBC: 0.7 % — ABNORMAL HIGH (ref 0.0–0.2)

## 2023-11-11 LAB — MAGNESIUM: Magnesium: 2.8 mg/dL — ABNORMAL HIGH (ref 1.7–2.4)

## 2023-11-11 LAB — GLUCOSE, CAPILLARY
Glucose-Capillary: 108 mg/dL — ABNORMAL HIGH (ref 70–99)
Glucose-Capillary: 116 mg/dL — ABNORMAL HIGH (ref 70–99)
Glucose-Capillary: 95 mg/dL (ref 70–99)
Glucose-Capillary: 73 mg/dL (ref 70–99)

## 2023-11-11 LAB — BODY FLUID CULTURE W GRAM STAIN: Culture: NO GROWTH

## 2023-11-11 LAB — BASIC METABOLIC PANEL WITH GFR
Anion gap: 9 (ref 5–15)
BUN: 43 mg/dL — ABNORMAL HIGH (ref 8–23)
CO2: 20 mmol/L — ABNORMAL LOW (ref 22–32)
Calcium: 11.9 mg/dL — ABNORMAL HIGH (ref 8.9–10.3)
Chloride: 114 mmol/L — ABNORMAL HIGH (ref 98–111)
Creatinine, Ser: 1.37 mg/dL — ABNORMAL HIGH (ref 0.44–1.00)
GFR, Estimated: 40 mL/min — ABNORMAL LOW (ref >60)
Glucose, Bld: 105 mg/dL — ABNORMAL HIGH (ref 70–99)
Potassium: 4.5 mmol/L (ref 3.5–5.1)
Sodium: 143 mmol/L (ref 135–145)

## 2023-11-11 LAB — PHOSPHORUS: Phosphorus: 3.2 mg/dL (ref 2.5–4.6)

## 2023-11-11 MED ORDER — ORAL CARE MOUTH RINSE: 15.0000 mL | OROMUCOSAL | Status: DC | PRN

## 2023-11-11 MED ORDER — ORAL CARE MOUTH RINSE
15.0000 mL | OROMUCOSAL | Status: DC
  Administered 2023-11-11 – 2023-11-21 (×34): 15 mL via OROMUCOSAL

## 2023-11-11 NOTE — Progress Notes (Signed)
 Patient noted with NIHHS score of 5, MD ordered CT scan she is no acute distress, family at bedside, will continue to monitor

## 2023-11-11 NOTE — Plan of Care (Signed)

## 2023-11-11 NOTE — Progress Notes (Signed)
 PROGRESS NOTE    Heather Rogers  ZOX:[id number] DOB: 1946/07/25 DOA: 11/07/2023 PCP: [name], MD   Brief Narrative:  This 78 y.o. female with medical history significant for hypertension, metastatic breast cancer, diabetes, CKD stage IIIb presented to the hospital with symptomatic abdominal ascites.  Patient was recently hospitalized on 10/06/23 with C. difficile colitis during which she was noted to have abdominal ascites.  Over the course of the last month patient has gradual worsening of her ascites with associated shortness of breath with exertion and intermittent abdominal pain.  She has been taking her diuretics since hospital discharge but in fact her lower extremity edema is much improved from prior.  Workup in the ED reveals evidence of moderate abdominal ascites.  Patient is admitted for further evaluation.  Patient is going to have paracentesis by IR today.  Assessment & Plan:   Principal Problem:   Malignant ascites  Abdominal Ascites - presumably malignant. This is likely the cause of her dyspnea and discomfort.   She has minimal abdominal pain, denies any fever, Initially low index of suspicion for SBP. IR consulted for diagnostic and therapeutic paracentesis,  appropriate lab studies ordered. She has spiked fever, started on ceftriaxone for SBP prophylaxis. Ascitic fluid negative for SBP.  Continued to spike fevers.  Continue ceftriaxone for now. Patient continued to spike fevers. Antibiotics changed to Unasyn for possible aspiration. Chest x-ray ordered.  Fever could be related to malignancy.  Metastatic breast cancer: She follows with Dr. Pamelia Hoit. Patient is going to start chemotherapy next week.  Iron deficiency Anemia:  Continue oral iron supplementation.   Hypertension: Continue home amlodipine.   Type 2 diabetes: Almost  well-controlled, carb modified diet with sliding scale insulin   Hypercalcemia:  Likely in the setting of cancer. Continue IV  hydration.  Continue to monitor.  Acute kidney injury : Likely prerenal. Continue IV hydration. Avoid nephrotoxic medications  Fever ? Patient continued to spike fever despite on ceftriaxone. Fever could be related to malignancy, aspiration. Antibiotic changed to Unasyn.  Follow-up blood cultures urine cultures. Patient still spiking fever, Cultures so far unremarkable.  Altered Mental status: This could be due to fever, and gabapentin. Obtain CT head to rule out a stroke.  DVT prophylaxis: Lovenox Code Status: Full code Family Communication: No family at bedside Disposition Plan:    Status is: Inpatient Remains inpatient appropriate because: Admitted for abdominal ascites,  symptomatic and requiring IR paracentesis.   Consultants:  Oncology  Procedures: None Antimicrobials:  Anti-infectives (From admission, onward)    Start     Dose/Rate Route Frequency Ordered Stop   11/10/23 1130  Ampicillin-Sulbactam (UNASYN) 3 g in sodium chloride 0.9 % 100 mL IVPB        3 g 200 mL/hr over 30 Minutes Intravenous Every 8 hours 11/10/23 1034     11/08/23 1400  cefTRIAXone (ROCEPHIN) 2 g in sodium chloride 0.9 % 100 mL IVPB  Status:  Discontinued        2 g 200 mL/hr over 30 Minutes Intravenous Every 24 hours 11/08/23 1258 11/10/23 1030       Subjective: Patient was seen and examined at bedside. Overnight events noted.   Patient seems improved.  Following commands. She continued to spike fevers despite on ceftriaxone.  Objective: Vitals:   11/10/23 2146 11/11/23 0021 11/11/23 0532 11/11/23 0921  BP: (!) 136/46 (!) 129/51 130/64 132/62  Pulse: 95  77 79  Resp: 20 20 17 17   Temp: (!) 103.3 F (39.6 C)  99.7 F (37.6 C) 98.7 F (37.1 C) 98 F (36.7 C)  TempSrc: Rectal Rectal Oral Oral  SpO2: 96% 95% 92% 95%  Weight:      Height:        Intake/Output Summary (Last 24 hours) at 11/11/2023 1313 Last data filed at 11/11/2023 0911 Gross per 24 hour  Intake 435.45 ml  Output  1275 ml  Net -839.55 ml   Filed Weights   11/07/23 0956 11/07/23 1704  Weight: 70.8 kg 71 kg    Examination:  General exam: Appears comfortable, deconditioned, sick looking, not in any acute distress. Respiratory system: CTA bilaterally. Respiratory effort normal.  RR 16. Cardiovascular system: S1 & S2 heard, RRR. No JVD, murmurs, rubs, gallops or clicks.  Gastrointestinal system: Abdomen is moderately distended, soft and non tender. Normal bowel sounds heard. Central nervous system: Alert and oriented x 1. No focal neurological deficits. Extremities: Edema+, no cyanosis, no clubbing Skin: No rashes, lesions or ulcers Psychiatry: Judgement and insight appear normal. Mood & affect appropriate.     Data Reviewed: I have personally reviewed following labs and imaging studies  CBC: Recent Labs  Lab 11/07/23 1116 11/08/23 0454 11/08/23 2143 11/09/23 1319 11/10/23 0809 11/11/23 0604  WBC 12.9* 11.1* 10.2 11.5* 12.6* 19.4*  NEUTROABS 9.7*  --   --   --   --   --   HGB 9.1* 8.3* 9.3* 8.8* 8.8* 10.5*  HCT 28.2* 26.7* 28.8* 28.0* 27.9* 34.5*  MCV 80.3 83.4 80.9 81.4 82.3 84.6  PLT 300 229 271 251 185 194   Basic Metabolic Panel: Recent Labs  Lab 11/07/23 1116 11/08/23 0454 11/09/23 1319 11/10/23 0809 11/11/23 0604  NA 133* 135 136 138 143  K 4.6 4.3 4.9 4.5 4.5  CL 103 108 106 111 114*  CO2 22 20* 19* 20* 20*  GLUCOSE 161* 87 122* 129* 105*  BUN 27* 25* 36* 34* 43*  CREATININE 0.94 1.14* 1.33* 1.41* 1.37*  CALCIUM 11.5* 11.6* 11.1* 11.5* 11.9*  MG  --   --  2.4  --  2.8*  PHOS  --   --  2.4*  --  3.2   GFR: Estimated Creatinine Clearance: 31.3 mL/min (A) (by C-G formula based on SCr of 1.37 mg/dL (H)). Liver Function Tests: Recent Labs  Lab 11/07/23 1116 11/08/23 0454 11/09/23 1319 11/10/23 0809  AST 203* 177* 389* 351*  ALT 85* 73* 123* 130*  ALKPHOS 239* 186* 220* 227*  BILITOT 1.2 1.5* 1.4* 1.6*  PROT 7.2 6.0* 6.3* 5.7*  ALBUMIN 2.8* 2.4* 2.3* 2.1*    No results for input(s): "LIPASE", "AMYLASE" in the last 168 hours. Recent Labs  Lab 11/08/23 2143  AMMONIA 26   Coagulation Profile: No results for input(s): "INR", "PROTIME" in the last 168 hours. Cardiac Enzymes: No results for input(s): "CKTOTAL", "CKMB", "CKMBINDEX", "TROPONINI" in the last 168 hours. BNP (last 3 results) No results for input(s): "PROBNP" in the last 8760 hours. HbA1C: No results for input(s): "HGBA1C" in the last 72 hours. CBG: Recent Labs  Lab 11/10/23 1152 11/10/23 1659 11/10/23 2128 11/11/23 0728 11/11/23 1108  GLUCAP 133* 130* 114* 108* 116*   Lipid Profile: No results for input(s): "CHOL", "HDL", "LDLCALC", "TRIG", "CHOLHDL", "LDLDIRECT" in the last 72 hours. Thyroid Function Tests: No results for input(s): "TSH", "T4TOTAL", "FREET4", "T3FREE", "THYROIDAB" in the last 72 hours. Anemia Panel: No results for input(s): "VITAMINB12", "FOLATE", "FERRITIN", "TIBC", "IRON", "RETICCTPCT" in the last 72 hours. Sepsis Labs: Recent Labs  Lab 11/08/23 2143  LATICACIDVEN 1.2    Recent Results (from the past 240 hours)  Body fluid culture w Gram Stain     Status: None (Preliminary result)   Collection Time: 11/08/23  2:52 PM   Specimen: PATH Cytology Pleural fluid  Result Value Ref Range Status   Specimen Description   Final    PLEURAL Performed at [facility], [address], [place], [place] [address]    Special Requests   Final    NONE Performed at [facility], [address] [place], [place] [address]    Gram Stain   Final    RARE WBC PRESENT,BOTH PMN AND MONONUCLEAR NO ORGANISMS SEEN    Culture   Final    NO GROWTH 2 DAYS Performed at [facility], [address] [place], [place] [address]    Report Status PENDING  Incomplete  Culture, blood (Routine X 2) w Reflex to ID Panel     Status: None (Preliminary result)   Collection Time: 11/08/23  9:43 PM   Specimen: BLOOD LEFT HAND  Result  Value Ref Range Status   Specimen Description   Final    BLOOD LEFT HAND Performed at [facility], [address], [place], [place] [address]    Special Requests   Final    BOTTLES DRAWN AEROBIC AND ANAEROBIC Blood Culture results may not be optimal due to an inadequate volume of blood received in culture bottles Performed at [facility], [address] [place], [place] [address]    Culture   Final    NO GROWTH 3 DAYS Performed at [facility], [address], [place], [place] [address]    Report Status PENDING  Incomplete  Culture, blood (Routine X 2) w Reflex to ID Panel     Status: None (Preliminary result)   Collection Time: 11/08/23  9:43 PM   Specimen: BLOOD LEFT HAND  Result Value Ref Range Status   Specimen Description   Final    BLOOD LEFT HAND Performed at [facility], [address] [place], [place] [address]    Special Requests   Final    BOTTLES DRAWN AEROBIC AND ANAEROBIC Blood Culture results may not be optimal due to an inadequate volume of blood received in culture bottles Performed at [facility], [address] [place], [place] [address]    Culture   Final    NO GROWTH 3 DAYS Performed at [facility], [address] [place], [place] [address]    Report Status PENDING  Incomplete  Culture, blood (Routine X 2) w Reflex to ID Panel     Status: None (Preliminary result)   Collection Time: 11/10/23 11:19 AM   Specimen: BLOOD LEFT ARM  Result Value Ref Range Status   Specimen Description   Final    BLOOD LEFT ARM BLOOD LEFT HAND Performed at [facility], [address], [place], [place] [address]    Special Requests   Final    BOTTLES DRAWN AEROBIC ONLY Blood Culture results may not be optimal due to an inadequate volume of blood received in culture bottles Performed at [facility], [address] [place], [place] [address]    Culture   Final    NO  GROWTH < 24 HOURS Performed at [facility], [address], [place], [place] [address]    Report Status PENDING  Incomplete  Culture, blood (  Routine X 2) w Reflex to ID Panel     Status: None (Preliminary result)   Collection Time: 11/10/23 11:19 AM   Specimen: BLOOD  Result Value Ref Range Status   Specimen Description   Final    BLOOD BLOOD LEFT HAND Performed at [facility], [address] [place], [place] [address]    Special Requests   Final    BOTTLES DRAWN AEROBIC ONLY Blood Culture results may not be optimal due to an inadequate volume of blood received in culture bottles Performed at [facility], [address] [place], [place] [address]    Culture   Final    NO GROWTH < 24 HOURS Performed at [facility], [address] [place], [place] [address]    Report Status PENDING  Incomplete    Radiology Studies: DG CHEST PORT 1 VIEW Result Date: 11/10/2023 CLINICAL DATA:  Aspiration pneumonia EXAM: PORTABLE CHEST 1 VIEW COMPARISON:  None Available. FINDINGS: Similar appearance of left retrocardiac opacity. Minimal blunting of the right costophrenic angle. Heart mediastinum are unchanged. IMPRESSION: 1. No significant interval change. Electronically Signed   By: Reagan Camera M.D.   On: 11/10/2023 12:55   Scheduled Meds:  amLODipine  10 mg Oral Daily   aspirin EC  81 mg Oral Daily   carvedilol  25 mg Oral BID WC   enoxaparin (LOVENOX) injection  40 mg Subcutaneous Q24H   ferrous sulfate  325 mg Oral Q breakfast   gabapentin  100 mg Oral TID   insulin aspart  0-15 Units Subcutaneous TID WC   insulin aspart  0-5 Units Subcutaneous QHS   mouth rinse  15 mL Mouth Rinse 4 times per day   Continuous Infusions:  ampicillin-sulbactam (UNASYN) IV 3 g (11/11/23 1024)     LOS: 4 days    Time spent: 50 mins    Magdalene School, MD [facility]   If 7PM-7AM, please contact night-coverage

## 2023-11-11 NOTE — Plan of Care (Signed)

## 2023-11-11 NOTE — Progress Notes (Signed)
   11/10/23 2146  Assess: MEWS Score  Temp (!) 103.3 F (39.6 C)  BP (!) 136/46  Pulse Rate 95  Resp 20  SpO2 96 %  O2 Device Room Air  Assess: MEWS Score  MEWS Temp 2  MEWS Systolic 0  MEWS Pulse 0  MEWS RR 0  MEWS LOC 0  MEWS Score 2  MEWS Score Color Yellow  Assess: if the MEWS score is Yellow or Red  Were vital signs accurate and taken at a resting state? Yes  Does the patient meet 2 or more of the SIRS criteria? Yes  Notify: Charge Nurse/RN  Name of Charge Nurse/RN Notified Karry Paganini  Provider Notification  Provider Name/Title Denece Finger, NP  Date Provider Notified 11/10/23  Time Provider Notified 2200  Method of Notification Face-to-face  Notification Reason Other (Comment) (yellow mews, temp)  Provider response No new orders  Date of Provider Response 11/10/23  Time of Provider Response 2200  Assess: SIRS CRITERIA  SIRS Temperature  1  SIRS Respirations  0  SIRS Pulse 1  SIRS WBC 0  SIRS Score Sum  2

## 2023-11-12 ENCOUNTER — Other Ambulatory Visit: Payer: Self-pay

## 2023-11-12 ENCOUNTER — Inpatient Hospital Stay: Admitting: Hematology and Oncology

## 2023-11-12 ENCOUNTER — Inpatient Hospital Stay (HOSPITAL_COMMUNITY)

## 2023-11-12 ENCOUNTER — Encounter (HOSPITAL_COMMUNITY): Payer: Self-pay | Admitting: Internal Medicine

## 2023-11-12 ENCOUNTER — Inpatient Hospital Stay

## 2023-11-12 DIAGNOSIS — E119 Type 2 diabetes mellitus without complications: Secondary | ICD-10-CM

## 2023-11-12 DIAGNOSIS — R188 Other ascites: Secondary | ICD-10-CM

## 2023-11-12 DIAGNOSIS — R180 Malignant ascites: Secondary | ICD-10-CM | POA: Diagnosis not present

## 2023-11-12 DIAGNOSIS — R4182 Altered mental status, unspecified: Secondary | ICD-10-CM

## 2023-11-12 DIAGNOSIS — D509 Iron deficiency anemia, unspecified: Secondary | ICD-10-CM

## 2023-11-12 DIAGNOSIS — I10 Essential (primary) hypertension: Secondary | ICD-10-CM

## 2023-11-12 DIAGNOSIS — R630 Anorexia: Secondary | ICD-10-CM

## 2023-11-12 LAB — CBC
HCT: 27.3 % — ABNORMAL LOW (ref 36.0–46.0)
Hemoglobin: 8.9 g/dL — ABNORMAL LOW (ref 12.0–15.0)
MCH: 25.6 pg — ABNORMAL LOW (ref 26.0–34.0)
MCHC: 32.6 g/dL (ref 30.0–36.0)
MCV: 78.7 fL — ABNORMAL LOW (ref 80.0–100.0)
Platelets: 215 10*3/uL (ref 150–400)
RBC: 3.47 MIL/uL — ABNORMAL LOW (ref 3.87–5.11)
RDW: 18 % — ABNORMAL HIGH (ref 11.5–15.5)
WBC: 17.1 10*3/uL — ABNORMAL HIGH (ref 4.0–10.5)
nRBC: 0.7 % — ABNORMAL HIGH (ref 0.0–0.2)

## 2023-11-12 LAB — COMPREHENSIVE METABOLIC PANEL WITH GFR
ALT: 83 U/L — ABNORMAL HIGH (ref 0–44)
AST: 155 U/L — ABNORMAL HIGH (ref 15–41)
Albumin: 2.1 g/dL — ABNORMAL LOW (ref 3.5–5.0)
Alkaline Phosphatase: 207 U/L — ABNORMAL HIGH (ref 38–126)
Anion gap: 9 (ref 5–15)
BUN: 51 mg/dL — ABNORMAL HIGH (ref 8–23)
CO2: 22 mmol/L (ref 22–32)
Calcium: 12.5 mg/dL — ABNORMAL HIGH (ref 8.9–10.3)
Chloride: 115 mmol/L — ABNORMAL HIGH (ref 98–111)
Creatinine, Ser: 1.32 mg/dL — ABNORMAL HIGH (ref 0.44–1.00)
GFR, Estimated: 41 mL/min — ABNORMAL LOW (ref >60)
Glucose, Bld: 79 mg/dL (ref 70–99)
Potassium: 4.3 mmol/L (ref 3.5–5.1)
Sodium: 146 mmol/L — ABNORMAL HIGH (ref 135–145)
Total Bilirubin: 1.1 mg/dL (ref 0.0–1.2)
Total Protein: 6.2 g/dL — ABNORMAL LOW (ref 6.5–8.1)

## 2023-11-12 LAB — CYTOLOGY - NON PAP

## 2023-11-12 LAB — GLUCOSE, CAPILLARY
Glucose-Capillary: 74 mg/dL (ref 70–99)
Glucose-Capillary: 74 mg/dL (ref 70–99)
Glucose-Capillary: 75 mg/dL (ref 70–99)
Glucose-Capillary: 79 mg/dL (ref 70–99)
Glucose-Capillary: 80 mg/dL (ref 70–99)

## 2023-11-12 LAB — MAGNESIUM: Magnesium: 2.9 mg/dL — ABNORMAL HIGH (ref 1.7–2.4)

## 2023-11-12 LAB — PHOSPHORUS: Phosphorus: 3.2 mg/dL (ref 2.5–4.6)

## 2023-11-12 MED ORDER — SODIUM CHLORIDE 0.9 % IV SOLN: INTRAVENOUS | Status: DC

## 2023-11-12 MED ORDER — ENSURE ENLIVE PO LIQD
237.0000 mL | Freq: Two times a day (BID) | ORAL | Status: DC
  Administered 2023-11-13 – 2023-11-20 (×12): 237 mL via ORAL

## 2023-11-12 NOTE — Evaluation (Signed)
 Clinical/Bedside Swallow Evaluation Patient Details  Name: Heather Rogers MRN: [id number] Date of Birth: 1945-08-26  Today's Date: 11/12/2023 Time: SLP Start Time (ACUTE ONLY): 1604 SLP Stop Time (ACUTE ONLY): 1622 SLP Time Calculation (min) (ACUTE ONLY): 18 min  Past Medical History:  Past Medical History:  Diagnosis Date   Chronic kidney disease, stage 3b (HCC)    Diabetes (HCC)    Hypertension    Pneumonia due to COVID-19 virus 07/25/2020   Primary malignant neoplasm of breast with metastasis (HCC) 09/14/2021   Stroke (cerebrum) (HCC)    Stroke (HCC) 05/15/2018   Past Surgical History:  Past Surgical History:  Procedure Laterality Date   CHEST TUBE INSERTION Left 09/07/2021   Procedure: CHEST TUBE INSERTION;  Surgeon: Lorin Glass, MD;  Location: [facility] ENDOSCOPY;  Service: Pulmonary;  Laterality: Left;   CHEST TUBE INSERTION N/A 03/19/2022   Procedure: REMOVAL PLEURAL DRAINAGE CATHETER;  Surgeon: Omar Person, MD;  Location: [facility] ENDOSCOPY;  Service: Pulmonary;  Laterality: N/A;   EYE SURGERY Left 04/06/2020   Implant placed   EYE SURGERY Right 03/30/2020   Implant placed    hysterectomy     LOOP RECORDER INSERTION N/A 05/20/2018   Procedure: LOOP RECORDER INSERTION;  Surgeon: Hillis Range, MD;  Location: [facility] INVASIVE CV LAB;  Service: Cardiovascular;  Laterality: N/A;   TEE WITHOUT CARDIOVERSION N/A 05/20/2018   Procedure: TRANSESOPHAGEAL ECHOCARDIOGRAM (TEE);  Surgeon: Quintella Reichert, MD;  Location: [facility] ENDOSCOPY;  Service: Cardiovascular;  Laterality: N/A;   HPI:  78 yo admitted with increasing dyspnea and abdominal pain from abdominal ascites. Pt has had persistent fevers and swallow eval was ordered. PMH includes: HTN, metastatic breast cancer, diabetes, CKD stage IIIb    Previous swallow eval March 2025 with no evidence of oropharyngeal dysphagia but did have eructation and h/o GERD.    Assessment / Plan / Recommendation  Clinical Impression  Pt shows signs of a  cognitively-based oral dysphagia that include reduced mastication and oral holding. She benefits from liquid washes and reduction of distracters in her environment. Daughter at bedside shares that concern for aspiration the other day was after pt was vomiting, and says that she often vomits and/or refluxes her food at home. She uses antiemetics regularly during meals to keep her food down. From an oropharyngeal standpoint, she is appropriate to resume PO diet - discussed options with daughter who would like to start with Dys 2 (finely chopped), thin liquids. Note that her risk may be highest for aspiration post-prandially due to esophageal and/or other medical issues. Will defer any f/u to this to MD, but MD is in agreement with resuming solid PO diet at this time. SLP will follow in light of AMS.   SLP Visit Diagnosis: Dysphagia, unspecified (R13.10)    Aspiration Risk       Diet Recommendation Dysphagia 2 (Fine chop);Thin liquid    Liquid Administration via: Cup;Straw Medication Administration: Whole meds with puree Supervision: Staff to assist with self feeding;Full supervision/cueing for compensatory strategies Compensations: Minimize environmental distractions;Slow rate;Small sips/bites;Follow solids with liquid Postural Changes: Seated upright at 90 degrees;Remain upright for at least 30 minutes after po intake    Other  Recommendations Oral Care Recommendations: Oral care BID    Recommendations for follow up therapy are one component of a multi-disciplinary discharge planning process, led by the attending physician.  Recommendations may be updated based on patient status, additional functional criteria and insurance authorization.  Follow up Recommendations Other (comment) (TBA)  Assistance Recommended at Discharge    Functional Status Assessment Patient has had a recent decline in their functional status and demonstrates the ability to make significant improvements in function in  a reasonable and predictable amount of time.  Frequency and Duration min 2x/week  2 weeks       Prognosis Prognosis for improved oropharyngeal function: Good Barriers to Reach Goals: Cognitive deficits      Swallow Study   General HPI: 78 yo admitted with increasing dyspnea and abdominal pain from abdominal ascites. Pt has had persistent fevers and swallow eval was ordered. PMH includes: HTN, metastatic breast cancer, diabetes, CKD stage IIIb    Previous swallow eval March 2025 with no evidence of oropharyngeal dysphagia but did have eructation and h/o GERD. Type of Study: Bedside Swallow Evaluation Previous Swallow Assessment: see HPI Diet Prior to this Study: NPO Temperature Spikes Noted: No Respiratory Status: Room air History of Recent Intubation: No Behavior/Cognition: Alert;Cooperative;Confused;Distractible;Requires cueing Oral Cavity Assessment: Within Functional Limits Oral Care Completed by SLP: No Oral Cavity - Dentition: Poor condition Vision: Functional for self-feeding Self-Feeding Abilities: Needs assist Patient Positioning: Upright in bed Baseline Vocal Quality: Normal Volitional Cough: Weak Volitional Swallow: Able to elicit    Oral/Motor/Sensory Function Overall Oral Motor/Sensory Function: Generalized oral weakness   Ice Chips Ice chips: Not tested   Thin Liquid Thin Liquid: Within functional limits Presentation: Cup;Self Fed;Straw    Nectar Thick Nectar Thick Liquid: Not tested   Honey Thick Honey Thick Liquid: Not tested   Puree Puree: Within functional limits Presentation: Spoon   Solid     Solid: Impaired Presentation: Self Fed Oral Phase Impairments: Impaired mastication Oral Phase Functional Implications: Oral holding      Beth Brooke., M.A. CCC-SLP Acute Rehabilitation Services Office [phone]  Secure chat preferred  11/12/2023,4:55 PM

## 2023-11-12 NOTE — Plan of Care (Signed)

## 2023-11-12 NOTE — Progress Notes (Signed)
 PROGRESS NOTE    Heather Rogers  XBJ:[id number] DOB: September 05, 1945 DOA: 11/07/2023 PCP: Heather Mulberry, MD   Brief Narrative:  "This 78 y.o. female with medical history significant for hypertension, metastatic breast cancer, diabetes, CKD stage IIIb presented to the hospital with symptomatic abdominal ascites.  Patient was recently hospitalized on 10/06/23 with C. difficile colitis during which she was noted to have abdominal ascites.  Over the course of the last month patient has gradual worsening of her ascites with associated shortness of breath with exertion and intermittent abdominal pain.  She has been taking her diuretics since hospital discharge but in fact her lower extremity edema is much improved from prior.  Workup in the ED reveals evidence of moderate abdominal ascites.  Patient is admitted for further evaluation.  Patient is going to have paracentesis by IR today."  I assumed case 4/15 for the day, covering. No fevers.  NPO pending SLP evaluation of swallow given aspiration concerns.  Remains on IV fluids for hypercalcemia, renal function given ongoing poor PO intake.     Assessment & Plan:   Principal Problem:   Malignant ascites  Abdominal Ascites - presumably malignant. Suspected main cause of her dyspnea and discomfort.   She has had minimal abdominal pain, denies fever, Initially low index of suspicion for SBP. IR was consulted for diagnostic and therapeutic paracentesis,  appropriate lab studies ordered. She had spiked fever, was started on ceftriaxone for SBP prophylaxis. Ascitic fluid negative for SBP.  Continued to spike fevers.   Now on Unasyn for ?aspiration PNA Patient continued to spike fevers.  Suspect Aspiration Pneumonia/pneumonitis Fever -- last fever 4/13 at 103.3, despite on Rocephin at the time.  Due to concern for aspiration, antibiotic changed to Unasyn to cover aspiration.  Fever could be also due to malignancy, aspiration. --Continue  Unasyn --Follow-up blood cultures urine cultures --SLP for swallow evaluation --NPO for now  Altered Mental status: suspect due to fever underlying cognitive impairement. CT head was non-acute, showed chronic stroke. --Delirium precautions --Treat infection as above --Correct electrolytes --Neuro checks --Minimize sedating meds (note is on lowest dose gabapentin)  Hypercalcemia:  Likely in the setting of cancer. --Continue IV fluids --Monitor BMP daily  Acute kidney injury : Likely prerenal. --Continue IV fluids --Avoid nephrotoxins & renally dose meds  Metastatic breast cancer: She follows with Dr. Gudena. Patient is going to start chemotherapy next week.  Anorexia / Poor PO intake Chronic, ongoing problem per daughter --Dietician consulted, appreciate recommendations  Iron deficiency Anemia:  Continue oral iron supplementation.   Hypertension: Continue home amlodipine.   Type 2 diabetes: Almost  well-controlled, carb modified diet with sliding scale insulin     DVT prophylaxis: Lovenox Code Status: Full code Family Communication: Daughter at bedside on rounds today Disposition Plan:    Status is: Inpatient Remains inpatient appropriate because: Admitted for abdominal ascites,  symptomatic and requiring IR paracentesis.   Consultants:  Oncology  Procedures: None Antimicrobials:  Anti-infectives (From admission, onward)    Start     Dose/Rate Route Frequency Ordered Stop   11/10/23 1130  Ampicillin-Sulbactam (UNASYN) 3 g in sodium chloride 0.9 % 100 mL IVPB        3 g 200 mL/hr over 30 Minutes Intravenous Every 8 hours 11/10/23 1034     11/08/23 1400  cefTRIAXone (ROCEPHIN) 2 g in sodium chloride 0.9 % 100 mL IVPB  Status:  Discontinued        2 g 200 mL/hr over 30 Minutes Intravenous Every  24 hours 11/08/23 1258 11/10/23 1030       Subjective: Patient was seen with daughter at bedside this AM. Pt sleeping but wakes briefly to voice.  Pt has had  very poor appetite for some time.  Daughter reports speech changes noted yesterday seem improved today.  No other new or focal neurologic changes other than chronic since prior stroke.      Objective: Vitals:   11/11/23 1413 11/11/23 2141 11/12/23 0449 11/12/23 1429  BP: (!) 128/55 (!) 126/52 (!) 127/54 (!) 140/63  Pulse: 76 78 73 74  Resp: 17 16 16    Temp: 98.2 F (36.8 C) 98.8 F (37.1 C) 97.7 F (36.5 C) 98.2 F (36.8 C)  TempSrc: Oral Oral Oral Oral  SpO2: 96% 93% 94% 91%  Weight:      Height:        Intake/Output Summary (Last 24 hours) at 11/12/2023 1559 Last data filed at 11/12/2023 0300 Gross per 24 hour  Intake 0 ml  Output 300 ml  Net -300 ml   Filed Weights   11/07/23 0956 11/07/23 1704  Weight: 70.8 kg 71 kg    Examination:  General exam: somnolent, wakes to voice briefly, no acute distress HEENT: moist mucus membranes, hearing grossly normal  Respiratory system: CTAB diminished due to low inspiratory volumes, no wheezes or rhonchi, normal respiratory effort. Cardiovascular system: normal S1/S2,  RRR, no JVD, murmurs, rubs, gallops, no edema.   Gastrointestinal system: soft, NT, ND, no HSM felt, +bowel sounds. Central nervous system: no gross focal neurologic deficits, normal speech Skin: dry, intact, normal temperature Psychiatry: limited interaction from pt due to somnolence, unable to assess at this time    Data Reviewed: I have personally reviewed following labs and imaging studies  CBC: Recent Labs  Lab 11/07/23 1116 11/08/23 0454 11/08/23 2143 11/09/23 1319 11/10/23 0809 11/11/23 0604 11/12/23 0538  WBC 12.9*   < > 10.2 11.5* 12.6* 19.4* 17.1*  NEUTROABS 9.7*  --   --   --   --   --   --   HGB 9.1*   < > 9.3* 8.8* 8.8* 10.5* 8.9*  HCT 28.2*   < > 28.8* 28.0* 27.9* 34.5* 27.3*  MCV 80.3   < > 80.9 81.4 82.3 84.6 78.7*  PLT 300   < > 271 251 185 194 215   < > = values in this interval not displayed.   Basic Metabolic Panel: Recent Labs   Lab 11/08/23 0454 11/09/23 1319 11/10/23 0809 11/11/23 0604 11/12/23 0538  NA 135 136 138 143 146*  K 4.3 4.9 4.5 4.5 4.3  CL 108 106 111 114* 115*  CO2 20* 19* 20* 20* 22  GLUCOSE 87 122* 129* 105* 79  BUN 25* 36* 34* 43* 51*  CREATININE 1.14* 1.33* 1.41* 1.37* 1.32*  CALCIUM 11.6* 11.1* 11.5* 11.9* 12.5*  MG  --  2.4  --  2.8* 2.9*  PHOS  --  2.4*  --  3.2 3.2   GFR: Estimated Creatinine Clearance: 32.4 mL/min (A) (by C-G formula based on SCr of 1.32 mg/dL (H)). Liver Function Tests: Recent Labs  Lab 11/07/23 1116 11/08/23 0454 11/09/23 1319 11/10/23 0809 11/12/23 0538  AST 203* 177* 389* 351* 155*  ALT 85* 73* 123* 130* 83*  ALKPHOS 239* 186* 220* 227* 207*  BILITOT 1.2 1.5* 1.4* 1.6* 1.1  PROT 7.2 6.0* 6.3* 5.7* 6.2*  ALBUMIN 2.8* 2.4* 2.3* 2.1* 2.1*   No results for input(s): "LIPASE", "AMYLASE" in the last  168 hours. Recent Labs  Lab 11/08/23 2143  AMMONIA 26   Coagulation Profile: No results for input(s): "INR", "PROTIME" in the last 168 hours. Cardiac Enzymes: No results for input(s): "CKTOTAL", "CKMB", "CKMBINDEX", "TROPONINI" in the last 168 hours. BNP (last 3 results) No results for input(s): "PROBNP" in the last 8760 hours. HbA1C: No results for input(s): "HGBA1C" in the last 72 hours. CBG: Recent Labs  Lab 11/11/23 1700 11/11/23 2142 11/12/23 0005 11/12/23 0736 11/12/23 1154  GLUCAP 95 73 79 80 75   Lipid Profile: No results for input(s): "CHOL", "HDL", "LDLCALC", "TRIG", "CHOLHDL", "LDLDIRECT" in the last 72 hours. Thyroid Function Tests: No results for input(s): "TSH", "T4TOTAL", "FREET4", "T3FREE", "THYROIDAB" in the last 72 hours. Anemia Panel: No results for input(s): "VITAMINB12", "FOLATE", "FERRITIN", "TIBC", "IRON", "RETICCTPCT" in the last 72 hours. Sepsis Labs: Recent Labs  Lab 11/08/23 2143  LATICACIDVEN 1.2    Recent Results (from the past 240 hours)  Body fluid culture w Gram Stain     Status: None   Collection Time:  11/08/23  2:52 PM   Specimen: PATH Cytology Pleural fluid  Result Value Ref Range Status   Specimen Description   Final    PLEURAL Performed at [facility], [address] [place], [place] [address]    Special Requests   Final    NONE Performed at [facility], [address], [place], [place] [address]    Gram Stain   Final    RARE WBC PRESENT,BOTH PMN AND MONONUCLEAR NO ORGANISMS SEEN    Culture   Final    NO GROWTH 3 DAYS Performed at [facility], [address] [place], [place] [address]    Report Status 11/11/2023 FINAL  Final  Culture, blood (Routine X 2) w Reflex to ID Panel     Status: None (Preliminary result)   Collection Time: 11/08/23  9:43 PM   Specimen: BLOOD LEFT HAND  Result Value Ref Range Status   Specimen Description   Final    BLOOD LEFT HAND Performed at [facility], [address] [place], [place] [address]    Special Requests   Final    BOTTLES DRAWN AEROBIC AND ANAEROBIC Blood Culture results may not be optimal due to an inadequate volume of blood received in culture bottles Performed at [facility], [address], [place], [place] [address]    Culture   Final    NO GROWTH 4 DAYS Performed at [facility], [address], [place], [place] [address]    Report Status PENDING  Incomplete  Culture, blood (Routine X 2) w Reflex to ID Panel     Status: None (Preliminary result)   Collection Time: 11/08/23  9:43 PM   Specimen: BLOOD LEFT HAND  Result Value Ref Range Status   Specimen Description   Final    BLOOD LEFT HAND Performed at [facility], [address] [place], [place] [address]    Special Requests   Final    BOTTLES DRAWN AEROBIC AND ANAEROBIC Blood Culture results may not be optimal due to an inadequate volume of blood received in culture bottles Performed at [facility], [address] [place], [place] [address]    Culture    Final    NO GROWTH 4 DAYS Performed at [facility], [address] [place], [place] [address]    Report Status PENDING  Incomplete  Culture, blood (  Routine X 2) w Reflex to ID Panel     Status: None (Preliminary result)   Collection Time: 11/10/23 11:19 AM   Specimen: BLOOD LEFT ARM  Result Value Ref Range Status   Specimen Description   Final    BLOOD LEFT ARM BLOOD LEFT HAND Performed at [facility], [address] [place], [place] [address]    Special Requests   Final    BOTTLES DRAWN AEROBIC ONLY Blood Culture results may not be optimal due to an inadequate volume of blood received in culture bottles Performed at [facility], [address] [place], [place] [address]    Culture   Final    NO GROWTH 2 DAYS Performed at [facility], [address], [place], [place] [address]    Report Status PENDING  Incomplete  Culture, blood (Routine X 2) w Reflex to ID Panel     Status: None (Preliminary result)   Collection Time: 11/10/23 11:19 AM   Specimen: BLOOD  Result Value Ref Range Status   Specimen Description   Final    BLOOD BLOOD LEFT HAND Performed at [facility], [address], [place], [place] [address]    Special Requests   Final    BOTTLES DRAWN AEROBIC ONLY Blood Culture results may not be optimal due to an inadequate volume of blood received in culture bottles Performed at [facility], [address] [place], [place] [address]    Culture   Final    NO GROWTH 2 DAYS Performed at [facility], [address], [place], [place] [address]    Report Status PENDING  Incomplete    Radiology Studies: CT HEAD WO CONTRAST ( ) Result Date: 11/12/2023 CLINICAL DATA:  Provided history: Mental status change, unknown cause. Additional history provided: Metastatic breast cancer, fever, confusion. EXAM: CT HEAD WITHOUT CONTRAST TECHNIQUE: Contiguous axial images were obtained from  the base of the skull through the vertex without intravenous contrast. RADIATION DOSE REDUCTION: This exam was performed according to the departmental dose-optimization program which includes automated exposure control, adjustment of the mA and/or kV according to patient size and/or use of iterative reconstruction technique. COMPARISON:  Head CT 09/28/2023. Report from MRI brain and MRA head 05/15/2018. FINDINGS: Brain: Generalized cerebral atrophy. Chronic cortical/subcortical left PCA territory infarct again demonstrated within the left occipital lobe. Background mild patchy and ill-defined hypoattenuation within the cerebral white matter, nonspecific but compatible with chronic small vessel ischemic disease There is no acute intracranial hemorrhage. No acute demarcated cortical infarct. No extra-axial fluid collection. No evidence of an intracranial mass. No midline shift. Vascular: No hyperdense vessel.  Atherosclerotic calcifications. Skull: No calvarial fracture or aggressive osseous lesion. Sinuses/Orbits: No mass or acute finding within the imaged orbits. Trace mucosal thickening within left ethmoid air cells. IMPRESSION: 1. No evidence of an acute intracranial abnormality. 2. Known chronic left PCA territory infarct. 3. Background parenchymal atrophy and chronic small vessel ischemic disease. Electronically Signed   By: Jackey Loge D.O.   On: 11/12/2023 09:24   Scheduled Meds:  amLODipine  10 mg Oral Daily   aspirin EC  81 mg Oral Daily   carvedilol  25 mg Oral BID WC   enoxaparin (LOVENOX) injection  40 mg Subcutaneous Q24H   ferrous sulfate  325 mg Oral Q breakfast   gabapentin  100 mg Oral TID   insulin aspart  0-15 Units Subcutaneous TID WC   insulin aspart  0-5 Units  Subcutaneous QHS   mouth rinse  15 mL Mouth Rinse 4 times per day   Continuous Infusions:  ampicillin-sulbactam (UNASYN) IV 3 g (11/12/23 1106)     LOS: 5 days    Time spent: 42 mins    Montey Apa, DO [facility]   If 7PM-7AM, please contact night-coverage

## 2023-11-13 ENCOUNTER — Inpatient Hospital Stay: Admitting: Pharmacist

## 2023-11-13 ENCOUNTER — Inpatient Hospital Stay

## 2023-11-13 DIAGNOSIS — N179 Acute kidney failure, unspecified: Secondary | ICD-10-CM

## 2023-11-13 DIAGNOSIS — R180 Malignant ascites: Secondary | ICD-10-CM | POA: Diagnosis not present

## 2023-11-13 LAB — COMPREHENSIVE METABOLIC PANEL WITH GFR
ALT: 61 U/L — ABNORMAL HIGH (ref 0–44)
AST: 113 U/L — ABNORMAL HIGH (ref 15–41)
Albumin: 1.8 g/dL — ABNORMAL LOW (ref 3.5–5.0)
Alkaline Phosphatase: 208 U/L — ABNORMAL HIGH (ref 38–126)
Anion gap: 3 — ABNORMAL LOW (ref 5–15)
BUN: 41 mg/dL — ABNORMAL HIGH (ref 8–23)
CO2: 22 mmol/L (ref 22–32)
Calcium: 11.4 mg/dL — ABNORMAL HIGH (ref 8.9–10.3)
Chloride: 123 mmol/L — ABNORMAL HIGH (ref 98–111)
Creatinine, Ser: 0.98 mg/dL (ref 0.44–1.00)
GFR, Estimated: 59 mL/min — ABNORMAL LOW (ref >60)
Glucose, Bld: 71 mg/dL (ref 70–99)
Potassium: 3.6 mmol/L (ref 3.5–5.1)
Sodium: 148 mmol/L — ABNORMAL HIGH (ref 135–145)
Total Bilirubin: 0.9 mg/dL (ref 0.0–1.2)
Total Protein: 5.4 g/dL — ABNORMAL LOW (ref 6.5–8.1)

## 2023-11-13 LAB — CBC
HCT: 23.5 % — ABNORMAL LOW (ref 36.0–46.0)
Hemoglobin: 7.6 g/dL — ABNORMAL LOW (ref 12.0–15.0)
MCH: 25.6 pg — ABNORMAL LOW (ref 26.0–34.0)
MCHC: 32.3 g/dL (ref 30.0–36.0)
MCV: 79.1 fL — ABNORMAL LOW (ref 80.0–100.0)
Platelets: 209 10*3/uL (ref 150–400)
RBC: 2.97 MIL/uL — ABNORMAL LOW (ref 3.87–5.11)
RDW: 18.3 % — ABNORMAL HIGH (ref 11.5–15.5)
WBC: 11.9 10*3/uL — ABNORMAL HIGH (ref 4.0–10.5)
nRBC: 1.5 % — ABNORMAL HIGH (ref 0.0–0.2)

## 2023-11-13 LAB — GLUCOSE, CAPILLARY
Glucose-Capillary: 85 mg/dL (ref 70–99)
Glucose-Capillary: 123 mg/dL — ABNORMAL HIGH (ref 70–99)
Glucose-Capillary: 100 mg/dL — ABNORMAL HIGH (ref 70–99)
Glucose-Capillary: 148 mg/dL — ABNORMAL HIGH (ref 70–99)

## 2023-11-13 LAB — CULTURE, BLOOD (ROUTINE X 2)
Culture: NO GROWTH
Culture: NO GROWTH

## 2023-11-13 LAB — PHOSPHORUS: Phosphorus: 2.1 mg/dL — ABNORMAL LOW (ref 2.5–4.6)

## 2023-11-13 MED ORDER — ADULT MULTIVITAMIN W/MINERALS CH
1.0000 | ORAL_TABLET | Freq: Every day | ORAL | Status: DC
  Administered 2023-11-14 – 2023-11-21 (×8): 1 via ORAL
  Filled 2023-11-13 (×8): qty 1

## 2023-11-13 MED ORDER — ZOLEDRONIC ACID 4 MG/100ML IV SOLN
4.0000 mg | Freq: Once | INTRAVENOUS | Status: AC
  Administered 2023-11-13: 4 mg via INTRAVENOUS
  Filled 2023-11-13: qty 100

## 2023-11-13 MED ORDER — CHLORHEXIDINE GLUCONATE CLOTH 2 % EX PADS
6.0000 | MEDICATED_PAD | Freq: Every day | CUTANEOUS | Status: DC
  Administered 2023-11-13 – 2023-11-21 (×8): 6 via TOPICAL

## 2023-11-13 MED ORDER — DEXTROSE-SODIUM CHLORIDE 5-0.45 % IV SOLN: INTRAVENOUS | Status: DC

## 2023-11-13 NOTE — TOC Progression Note (Signed)
 Transition of Care [facility]) - Progression Note    Patient Details  Name: Heather Rogers MRN: [id number] Date of Birth: January 31, 1946  [facility] CM/SW Contact  Loreda Rodriguez, RN Phone Number:[phone]  11/13/2023, 3:16 PM  Clinical Narrative:    Patient is active with [facility] services. Md will need to enter orders at discharge for services to be resumed.    Expected Discharge Plan: Home w Home Health Services Barriers to Discharge: Continued Medical Work up  Expected Discharge Plan and Services In-house Referral: NA Discharge Planning Services: CM Consult Post Acute Care Choice: NA Living arrangements for the past 2 months: Single Family Home                 DME Arranged: N/A DME Agency: NA       HH Arranged: NA HH Agency: NA         Social Determinants of Health (SDOH) Interventions SDOH Screenings   Food Insecurity: No Food Insecurity (11/07/2023)  Housing: Low Risk  (11/07/2023)  Transportation Needs: No Transportation Needs (11/07/2023)  Utilities: Not At Risk (11/07/2023)  Alcohol Screen: Low Risk  (05/22/2023)  Depression (PHQ2-9): Low Risk  (10/23/2023)  Financial Resource Strain: Low Risk  (08/22/2023)  Physical Activity: Inactive (08/22/2023)  Social Connections: Unknown (11/07/2023)  Stress: No Stress Concern Present (08/22/2023)  Tobacco Use: Medium Risk (11/07/2023)  Health Literacy: Adequate Health Literacy (05/22/2023)    Readmission Risk Interventions    11/08/2023    3:30 PM  Readmission Risk Prevention Plan  Transportation Screening Complete  PCP or Specialist Appt within 3-5 Days Complete  HRI or Home Care Consult Complete  Social Work Consult for Recovery Care Planning/Counseling Complete  Medication Review Oceanographer) Referral to Pharmacy

## 2023-11-13 NOTE — Progress Notes (Signed)
 PROGRESS NOTE    Heather Rogers  ZOX:[id number] DOB: [birth year] DOA: 11/07/2023 PCP: [name], MD   Brief Narrative:  78 y.o. female with medical history significant for hypertension, metastatic breast cancer, diabetes, CKD stage IIIb and recently hospitalized in March 2025 with C. difficile colitis presented to the hospital with worsening symptomatic abdominal ascites despite taking diuretics at home.  Workup in the ED showed moderate abdominal ascites.  She was admitted for the same.  She underwent ultrasound-guided paracentesis and removal of 1.3 L of yellow fluid on 11/08/2023 by IR.  Oncology planning to start chemotherapy as an outpatient.  Hospitalization complicated by possible aspiration pneumonia requiring IV Unasyn along with altered mental status.  Assessment & Plan:   Abdominal ascites Metastatic breast cancer Goals of care Failure to thrive/poor oral intake - Presumably malignant -underwent ultrasound-guided paracentesis and removal of 1.3 L of yellow fluid on 11/08/2023 by IR.  Cytology negative for malignant cells.  Initially started on Rocephin for SBP prophylaxis but ascitic fluid was negative for SBP -Oncology planning to start chemotherapy as an outpatient.   - Consult palliative care as per oncology recommendations (discussed case with Dr. Pamelia Hoit via secure chat today: He will see the patient today: Recommended palliative care consultation) - Oral intake remains poor.  Dietitian consulted.  Hypercalcemia - Possibly from malignancy.  Corrected calcium is above 13 today.  Will give a dose of Zometa.  Currently also on IV fluids.  Repeat a.m. labs  Acute kidney injury -Likely prerenal.  Treated with IV fluids.  Resolved.  Probable aspiration pneumonia/pneumonitis -Continue Unasyn.  Diet as per SLP recommendations.  Acute metabolic encephalopathy/altered mental status - Possibly from all of the above along with hypercalcemia. - CT head was nonacute. - Still  slow to respond and very sleepy.  Monitor mental status.  Fall precautions.  Minimize sedating medications.  Will hold gabapentin  Iron deficiency anemia -hemoglobin stable.  Monitor intermittently.  Continue oral supplementation  Hypertension - Continue amlodipine and Coreg  Diabetes mellitus type 2 - Continue CBGs with SSI.  Blood sugars well-controlled  Physical deconditioning -PT eval   DVT prophylaxis: Lovenox Code Status: Full Family Communication: Daughter at bedside Disposition Plan: Status is: Inpatient Remains inpatient appropriate because: Of severity of illness  Consultants: Oncology.  Consult palliative care  Procedures: As above  Antimicrobials:  Anti-infectives (From admission, onward)    Start     Dose/Rate Route Frequency Ordered Stop   11/10/23 1130  Ampicillin-Sulbactam (UNASYN) 3 g in sodium chloride 0.9 % 100 mL IVPB        3 g 200 mL/hr over 30 Minutes Intravenous Every 8 hours 11/10/23 1034     11/08/23 1400  cefTRIAXone (ROCEPHIN) 2 g in sodium chloride 0.9 % 100 mL IVPB  Status:  Discontinued        2 g 200 mL/hr over 30 Minutes Intravenous Every 24 hours 11/08/23 1258 11/10/23 1030        Subjective: Patient seen and examined at bedside.  Slow to respond, poor historian.  No seizures, fever, vomiting reported.  Daughter at bedside states that oral intake is slightly improving but still very poor.  Patient is mostly sleepy.  Objective: Vitals:   11/12/23 0449 11/12/23 1429 11/12/23 2200 11/13/23 0500  BP: (!) 127/54 (!) 140/63 (!) 161/61 (!) 141/59  Pulse: 73 74 79 68  Resp: 16 18 18 18   Temp: 97.7 F (36.5 C) 98.2 F (36.8 C) 98.6 F (37 C) 98.6 F (37 C)  TempSrc: Oral Oral Axillary Oral  SpO2: 94% 91%  95%  Weight:      Height:        Intake/Output Summary (Last 24 hours) at 11/13/2023 0742 Last data filed at 11/13/2023 0500 Gross per 24 hour  Intake --  Output 550 ml  Net -550 ml   Filed Weights   11/07/23 0956 11/07/23  1704  Weight: 70.8 kg 71 kg    Examination:  General exam: Appears calm and comfortable.  Looks chronically ill and deconditioned. Respiratory system: Bilateral decreased breath sounds at bases Cardiovascular system: S1 & S2 heard, Rate controlled Gastrointestinal system: Abdomen is nondistended, soft and nontender. Normal bowel sounds heard. Extremities: No cyanosis, clubbing, edema  Central nervous system: Awake, extremely slow to respond, poor historian.  No focal neurological deficits. Moving extremities Skin: No rashes, lesions or ulcers Psychiatry: Flat affect.  Not agitated.   Data Reviewed: I have personally reviewed following labs and imaging studies  CBC: Recent Labs  Lab 11/07/23 1116 11/08/23 0454 11/09/23 1319 11/10/23 0809 11/11/23 0604 11/12/23 0538 11/13/23 0541  WBC 12.9*   < > 11.5* 12.6* 19.4* 17.1* 11.9*  NEUTROABS 9.7*  --   --   --   --   --   --   HGB 9.1*   < > 8.8* 8.8* 10.5* 8.9* 7.6*  HCT 28.2*   < > 28.0* 27.9* 34.5* 27.3* 23.5*  MCV 80.3   < > 81.4 82.3 84.6 78.7* 79.1*  PLT 300   < > 251 185 194 215 209   < > = values in this interval not displayed.   Basic Metabolic Panel: Recent Labs  Lab 11/09/23 1319 11/10/23 0809 11/11/23 0604 11/12/23 0538 11/13/23 0541  NA 136 138 143 146* 148*  K 4.9 4.5 4.5 4.3 3.6  CL 106 111 114* 115* 123*  CO2 19* 20* 20* 22 22  GLUCOSE 122* 129* 105* 79 71  BUN 36* 34* 43* 51* 41*  CREATININE 1.33* 1.41* 1.37* 1.32* 0.98  CALCIUM 11.1* 11.5* 11.9* 12.5* 11.4*  MG 2.4  --  2.8* 2.9*  --   PHOS 2.4*  --  3.2 3.2 2.1*   GFR: Estimated Creatinine Clearance: 43.7 mL/min (by C-G formula based on SCr of 0.98 mg/dL). Liver Function Tests: Recent Labs  Lab 11/08/23 0454 11/09/23 1319 11/10/23 0809 11/12/23 0538 11/13/23 0541  AST 177* 389* 351* 155* 113*  ALT 73* 123* 130* 83* 61*  ALKPHOS 186* 220* 227* 207* 208*  BILITOT 1.5* 1.4* 1.6* 1.1 0.9  PROT 6.0* 6.3* 5.7* 6.2* 5.4*  ALBUMIN 2.4* 2.3*  2.1* 2.1* 1.8*   No results for input(s): "LIPASE", "AMYLASE" in the last 168 hours. Recent Labs  Lab 11/08/23 2143  AMMONIA 26   Coagulation Profile: No results for input(s): "INR", "PROTIME" in the last 168 hours. Cardiac Enzymes: No results for input(s): "CKTOTAL", "CKMB", "CKMBINDEX", "TROPONINI" in the last 168 hours. BNP (last 3 results) No results for input(s): "PROBNP" in the last 8760 hours. HbA1C: No results for input(s): "HGBA1C" in the last 72 hours. CBG: Recent Labs  Lab 11/12/23 0005 11/12/23 0736 11/12/23 1154 11/12/23 1654 11/12/23 2210  GLUCAP 79 80 75 74 74   Lipid Profile: No results for input(s): "CHOL", "HDL", "LDLCALC", "TRIG", "CHOLHDL", "LDLDIRECT" in the last 72 hours. Thyroid Function Tests: No results for input(s): "TSH", "T4TOTAL", "FREET4", "T3FREE", "THYROIDAB" in the last 72 hours. Anemia Panel: No results for input(s): "VITAMINB12", "FOLATE", "FERRITIN", "TIBC", "IRON", "RETICCTPCT" in the last 72  hours. Sepsis Labs: Recent Labs  Lab 11/08/23 2143  LATICACIDVEN 1.2    Recent Results (from the past 240 hours)  Body fluid culture w Gram Stain     Status: None   Collection Time: 11/08/23  2:52 PM   Specimen: PATH Cytology Pleural fluid  Result Value Ref Range Status   Specimen Description   Final    PLEURAL Performed at [facility], [address], [place], [place] [address]    Special Requests   Final    NONE Performed at [facility], [address] [place], [place] [address]    Gram Stain   Final    RARE WBC PRESENT,BOTH PMN AND MONONUCLEAR NO ORGANISMS SEEN    Culture   Final    NO GROWTH 3 DAYS Performed at [facility], [address] [place], [place] [address]    Report Status 11/11/2023 FINAL  Final  Culture, blood (Routine X 2) w Reflex to ID Panel     Status: None (Preliminary result)   Collection Time: 11/08/23  9:43 PM   Specimen: BLOOD LEFT HAND  Result Value Ref  Range Status   Specimen Description   Final    BLOOD LEFT HAND Performed at [facility], [address] [place], [place] [address]    Special Requests   Final    BOTTLES DRAWN AEROBIC AND ANAEROBIC Blood Culture results may not be optimal due to an inadequate volume of blood received in culture bottles Performed at [facility], [address], [place], [place] [address]    Culture   Final    NO GROWTH 4 DAYS Performed at [facility], [address], [place], [place] [address]    Report Status PENDING  Incomplete  Culture, blood (Routine X 2) w Reflex to ID Panel     Status: None (Preliminary result)   Collection Time: 11/08/23  9:43 PM   Specimen: BLOOD LEFT HAND  Result Value Ref Range Status   Specimen Description   Final    BLOOD LEFT HAND Performed at [facility], [address] [place], [place] [address]    Special Requests   Final    BOTTLES DRAWN AEROBIC AND ANAEROBIC Blood Culture results may not be optimal due to an inadequate volume of blood received in culture bottles Performed at [facility], [address], [place], [place] [address]    Culture   Final    NO GROWTH 4 DAYS Performed at [facility], [address], [place], [place] [address]    Report Status PENDING  Incomplete  Culture, blood (Routine X 2) w Reflex to ID Panel     Status: None (Preliminary result)   Collection Time: 11/10/23 11:19 AM   Specimen: BLOOD LEFT ARM  Result Value Ref Range Status   Specimen Description   Final    BLOOD LEFT ARM BLOOD LEFT HAND Performed at [facility], [address], [place], [place] [address]    Special Requests   Final    BOTTLES DRAWN AEROBIC ONLY Blood Culture results may not be optimal due to an inadequate volume of blood received in culture bottles Performed at [facility], [address] [place], [place] [address]    Culture   Final    NO GROWTH 2  DAYS Performed at [facility], [address] [place], [place] [address]  Report Status PENDING  Incomplete  Culture, blood (Routine X 2) w Reflex to ID Panel     Status: None (Preliminary result)   Collection Time: 11/10/23 11:19 AM   Specimen: BLOOD  Result Value Ref Range Status   Specimen Description   Final    BLOOD BLOOD LEFT HAND Performed at [facility], [address], [place], [place] [address]    Special Requests   Final    BOTTLES DRAWN AEROBIC ONLY Blood Culture results may not be optimal due to an inadequate volume of blood received in culture bottles Performed at [facility], [address] [place], [place] [address]    Culture   Final    NO GROWTH 2 DAYS Performed at [facility], [address] [place], [place] [address]    Report Status PENDING  Incomplete         Radiology Studies: CT HEAD WO CONTRAST ( ) Result Date: 11/12/2023 CLINICAL DATA:  Provided history: Mental status change, unknown cause. Additional history provided: Metastatic breast cancer, fever, confusion. EXAM: CT HEAD WITHOUT CONTRAST TECHNIQUE: Contiguous axial images were obtained from the base of the skull through the vertex without intravenous contrast. RADIATION DOSE REDUCTION: This exam was performed according to the departmental dose-optimization program which includes automated exposure control, adjustment of the mA and/or kV according to patient size and/or use of iterative reconstruction technique. COMPARISON:  Head CT 09/28/2023. Report from MRI brain and MRA head 05/15/2018. FINDINGS: Brain: Generalized cerebral atrophy. Chronic cortical/subcortical left PCA territory infarct again demonstrated within the left occipital lobe. Background mild patchy and ill-defined hypoattenuation within the cerebral white matter, nonspecific but compatible with chronic small vessel ischemic disease There is no acute intracranial hemorrhage. No acute  demarcated cortical infarct. No extra-axial fluid collection. No evidence of an intracranial mass. No midline shift. Vascular: No hyperdense vessel.  Atherosclerotic calcifications. Skull: No calvarial fracture or aggressive osseous lesion. Sinuses/Orbits: No mass or acute finding within the imaged orbits. Trace mucosal thickening within left ethmoid air cells. IMPRESSION: 1. No evidence of an acute intracranial abnormality. 2. Known chronic left PCA territory infarct. 3. Background parenchymal atrophy and chronic small vessel ischemic disease. Electronically Signed   By: Bascom Lily D.O.   On: 11/12/2023 09:24        Scheduled Meds:  amLODipine  10 mg Oral Daily   aspirin EC  81 mg Oral Daily   carvedilol  25 mg Oral BID WC   enoxaparin (LOVENOX) injection  40 mg Subcutaneous Q24H   feeding supplement  237 mL Oral BID BM   ferrous sulfate  325 mg Oral Q breakfast   gabapentin  100 mg Oral TID   insulin aspart  0-15 Units Subcutaneous TID WC   insulin aspart  0-5 Units Subcutaneous QHS   mouth rinse  15 mL Mouth Rinse 4 times per day   Continuous Infusions:  sodium chloride 75 mL/hr at 11/12/23 1813   ampicillin-sulbactam (UNASYN) IV 3 g (11/13/23 0134)          Audria Leather, MD [facility] 11/13/2023, 7:42 AM

## 2023-11-13 NOTE — Plan of Care (Signed)
  Problem: Education: Goal: Knowledge of General Education information will improve Description: Including pain rating scale, medication(s)/side effects and non-pharmacologic comfort measures Outcome: Progressing   Problem: Health Behavior/Discharge Planning: Goal: Ability to manage health-related needs will improve Outcome: Progressing   Problem: Clinical Measurements: Goal: Ability to maintain clinical measurements within normal limits will improve Outcome: Progressing Goal: Will remain free from infection Outcome: Progressing Goal: Diagnostic test results will improve Outcome: Progressing Goal: Respiratory complications will improve Outcome: Progressing Goal: Cardiovascular complication will be avoided Outcome: Progressing   Problem: Nutrition: Goal: Adequate nutrition will be maintained Outcome: Progressing   Problem: Elimination: Goal: Will not experience complications related to bowel motility Outcome: Progressing   Problem: Safety: Goal: Ability to remain free from injury will improve Outcome: Progressing   Problem: Education: Goal: Individualized Educational Video(s) Outcome: Progressing   Problem: Coping: Goal: Ability to adjust to condition or change in health will improve Outcome: Progressing   Problem: Fluid Volume: Goal: Ability to maintain a balanced intake and output will improve Outcome: Progressing   Problem: Metabolic: Goal: Ability to maintain appropriate glucose levels will improve Outcome: Progressing   Problem: Nutritional: Goal: Maintenance of adequate nutrition will improve Outcome: Progressing Goal: Progress toward achieving an optimal weight will improve Outcome: Progressing   Problem: Skin Integrity: Goal: Risk for impaired skin integrity will decrease Outcome: Progressing   Problem: Tissue Perfusion: Goal: Adequacy of tissue perfusion will improve Outcome: Progressing

## 2023-11-13 NOTE — Progress Notes (Signed)
 Initial Nutrition Assessment  DOCUMENTATION CODES:   Not applicable  INTERVENTION:  - DYS 1 diet per SLP.   - Add "Assist" with ordering so daughter can order patient foods she likes.    - Add Assist with patient feeding order to have staff assist patient and encourage intake. - Ensure Plus High Protein po BID, each supplement provides 350 kcal and 20 grams of protein. - Magic cup TID with meals, each supplement provides 290 kcal and 9 grams of protein. - Encourage intake at all meals and of supplements.  - Add Multivitamin with minerals daily - Monitor weight trends.  NUTRITION DIAGNOSIS:   Inadequate oral intake related to dysphagia, lethargy/confusion, poor appetite as evidenced by energy intake < or equal to 50% for > or equal to 5 days.  GOAL:   Patient will meet greater than or equal to 90% of their needs  MONITOR:   PO intake, Supplement acceptance, Diet advancement, Weight trends  REASON FOR ASSESSMENT:   Consult Assessment of nutrition requirement/status, Poor PO  ASSESSMENT:   78 y.o. female with PMH significant for HTN, metastatic breast cancer, diabetes, CKD stage IIIb who presented with symptomatic (presumed malignant) abdominal ascites.  Patient in bed at time of visit, noted to be a little slow to respond and a poor historian. Daughter at bedside provided all nutrition history.   UBW reported to be 147# prior to her having issues with ascites. Daughter shares the patient was as high as 167# with ascites but has been more recently around 157#.  EMR confirms trends. Weight appears stable since November however difficult to determine accurate weight changes with ongoing ascites.  Daughter reports the patient typically only eats 2 meals a day at home. However, over the past several months she has been eating much smaller portions. Daughter shares she may only have 1 piece of [place] bacon and an egg with cheese and some fruit for breakfast. If she doesn't eat  breakfast, will eat a grilled [place] and cheese sandwich for lunch. For dinner, often has chicken with vegetables.  Despite daughter making food the patient enjoys, she shares that for almost 1 month the patient hasn't even enjoyed eating the foods she normally would. Thankfully, she has been drinking 2 Ensure Max daily PTA.  Patient has not eaten well since admission Had 10-15% at the beginning of admission before noted to have swallowing issues with decreased mental status. Was NPO 4/13-4/15 before having an SLP eval and advanced to a DYS 2 diet.   Daughter says she is tolerating fairly, seems to be having trouble eating/drinking herself, which is new for her. Also notes that patient will not eat something if she doesn't like it. Discussed adding assist with ordering meals so daughter can order the patient food she enjoys. Will also add an assistance with eating order so staff can help patient and encourage intake if daughter is not present.   Daughter in agreement with plan. Patient has been receiving Ensure, continue to support intake.  SLP also came by at end of visit and patient now downgraded to a DYS 1 diet. Should also receive Magic Cup with meals.    Medications reviewed and include: 325mg  ferrous sulfate  Labs reviewed: Na 148 Phosphorus 2.1 HA1C 7.4 Blood Glucose 74-85 x24 hours   NUTRITION - FOCUSED PHYSICAL EXAM:  Flowsheet Row Most Recent Value  Orbital Region No depletion  Upper Arm Region Mild depletion  Thoracic and Lumbar Region Unable to assess  Buccal Region No depletion  Temple Region Moderate depletion  Clavicle Bone Region Moderate depletion  Clavicle and Acromion Bone Region Mild depletion  Scapular Bone Region Unable to assess  Dorsal Hand Mild depletion  Patellar Region Mild depletion  Anterior Thigh Region Mild depletion  Posterior Calf Region Mild depletion  Edema (RD Assessment) None  Hair Reviewed  Eyes Reviewed  Mouth Reviewed  Skin Reviewed   Nails Reviewed       Diet Order:   Diet Order             DIET - DYS 1 Room service appropriate? No; Fluid consistency: Thin  Diet effective now                   EDUCATION NEEDS:  Education needs have been addressed  Skin:  Skin Assessment: Reviewed RN Assessment  Last BM:  4/15 - type 6  Height:  Ht Readings from Last 1 Encounters:  11/07/23 5\' 2"  (1.575 m)   Weight:  Wt Readings from Last 1 Encounters:  11/07/23 71 kg    BMI:  Body mass index is 28.63 kg/m.  Estimated Nutritional Needs:  Kcal:  1750-1900 kcals Protein:  85-100 grams Fluid:  >/= 1.8L    Scheryl Cushing RD, LDN Contact via Secure Chat.

## 2023-11-13 NOTE — Evaluation (Signed)
 Physical Therapy Evaluation Patient Details Name: Heather Rogers MRN: [id number] DOB: [birth year] Today's Date: 11/13/2023  History of Present Illness  Heather Rogers is a 78 y.o. female admitted with malignant ascites. S/p Successful image-guided paracentesis from the right lower abdomen 4/11. Head CT 4/15: No evidence of an acute intracranial abnormality; Known chronic left PCA territory infarct. PMH: HTN, metastatic breast cancer, diabetes, CKD stage IIIb and recently hospitalized in March 2025 with C. difficile colitis  Clinical Impression  Pt admitted with above diagnosis. Per daughter, pt ind at home with ambulation using rollator, completing toileting ind, pt's grandson is present and can assist if needed, also pt active with HHOT/PT since recent hospitalization. Daughter reports she checks on pt daily and completes household chores. Daughter reports pt is normally able to recall president, name and birthdate appropriately, on eval pt states name and "[birth year]" as birthdate. Pt quite lethargic on eval, difficult to maintain alertness, unsure if pt needing increased time to respond to cues or returns to sleeping. Pt briefly able to pull on therapist's hands from elevated bed, to upright trunk ~3 inches off mattress into sitting but abruptly lets go and returns to elevated HOB mattress. Therapist only able to cue toe wiggling and limited ankle AROM, unable to cue hip or knee active movement on eval. Pt on RA with Spo2 88-93%- alerted RN. Daughter at bedside reports plan is for pt to return home with family support; recommend resume [facility] services and hospital bed and w/c DME at time of eval. Pt currently with functional limitations due to the deficits listed below (see PT Problem List). Pt will benefit from acute skilled PT to increase their independence and safety with mobility to allow discharge.           If plan is discharge home, recommend the following: Two people to help with walking and/or  transfers;Two people to help with bathing/dressing/bathroom;Assistance with cooking/housework;Assistance with feeding;Assist for transportation;Supervision due to cognitive status   Can travel by Doctor, hospital (measurements PT);Wheelchair cushion (measurements PT);Hospital bed  Recommendations for Other Services  OT consult    Functional Status Assessment Patient has had a recent decline in their functional status and demonstrates the ability to make significant improvements in function in a reasonable and predictable amount of time.     Precautions / Restrictions Precautions Precautions: Fall Recall of Precautions/Restrictions: Impaired Restrictions Weight Bearing Restrictions Per Provider Order: No      Mobility  Bed Mobility Overal bed mobility: Needs Assistance             General bed mobility comments: pt able to grab therapist's hands and pull trunk up into sitting but then quickly releases after ~3 seconds, total A to reposition BLE and prop with pillows    Transfers                   General transfer comment: pt unable to maintain alertness long enough to safely attempt transfers    Ambulation/Gait                  Stairs            Wheelchair Mobility     Tilt Bed    Modified Rankin (Stroke Patients Only)       Balance  Pertinent Vitals/Pain Pain Assessment Pain Assessment: Faces Faces Pain Scale: No hurt    Home Living Family/patient expects to be discharged to:: Private residence Living Arrangements: Other (Comment) (17 yo grandson) Available Help at Discharge: Family;Available 24 hours/day Type of Home: House Home Access: Ramped entrance       Home Layout: One level Home Equipment: Shower seat;Hand held Stage manager (4 wheels);BSC/3in1 Additional Comments: lives with grandson 76yo, daughter drives  school bus and checks on pt daily    Prior Function               Mobility Comments: daughter reports pt using rollator in the home, active with HHPT, able to get into/out of bed ind ADLs Comments: daughter reports pt ind with toileting, daughter providing supv with showering on shower seat; daughter completes household chores     Extremity/Trunk Assessment   Upper Extremity Assessment Upper Extremity Assessment: Defer to OT evaluation    Lower Extremity Assessment Lower Extremity Assessment: Generalized weakness;RLE deficits/detail;LLE deficits/detail RLE Deficits / Details: pt able to actively wiggle toes and ankle AROM ~50%, unable to cue pt for hip/knee flexion or hip abduction/adduction as pt continues to wiggle toes despite cues LLE Deficits / Details: pt able to actively wiggle toes and ankle AROM ~50%, unable to cue pt for hip/knee flexion or hip abduction/adduction as pt continues to wiggle toes despite cues       Communication        Cognition Arousal: Obtunded Behavior During Therapy: Flat affect   PT - Cognitive impairments: Difficult to assess, Attention, Initiation, Sequencing, Awareness, Problem solving                       PT - Cognition Comments: pt able to state first name appropriately, states "[birth year]" for birthdate, current age is "27", able to state daughter's name appropriately at bedside. Pt needs constant stimulation, unsure if pt drifting back to sleep vs needing increased time and cues to respond to questions. Following commands: Impaired Following commands impaired: Follows one step commands inconsistently     Cueing Cueing Techniques: Verbal cues, Gestural cues, Tactile cues, Visual cues     General Comments      Exercises     Assessment/Plan    PT Assessment Patient needs continued PT services  PT Problem List Decreased strength;Decreased cognition;Decreased activity tolerance;Decreased balance;Decreased mobility;Decreased  coordination;Decreased knowledge of use of DME;Decreased safety awareness;Decreased knowledge of precautions;Obesity       PT Treatment Interventions DME instruction;Gait training;Functional mobility training;Therapeutic activities;Therapeutic exercise;Balance training;Cognitive remediation;Patient/family education    PT Goals (Current goals can be found in the Care Plan section)  Acute Rehab PT Goals Patient Stated Goal: per daughter, plan is for pt to return home with family support PT Goal Formulation: With patient/family Time For Goal Achievement: 11/27/23 Potential to Achieve Goals: Fair    Frequency Min 2X/week     Co-evaluation               AM-PAC PT "6 Clicks" Mobility  Outcome Measure Help needed turning from your back to your side while in a flat bed without using bedrails?: Total Help needed moving from lying on your back to sitting on the side of a flat bed without using bedrails?: Total Help needed moving to and from a bed to a chair (including a wheelchair)?: Total Help needed standing up from a chair using your arms (e.g., wheelchair or bedside chair)?: Total Help needed to walk in hospital room?: Total Help  needed climbing 3-5 steps with a railing? : Total 6 Click Score: 6    End of Session   Activity Tolerance: Patient limited by lethargy Patient left: in bed;with call bell/phone within reach;with bed alarm set;with family/visitor present Nurse Communication: Mobility status;Other (comment) (SpO2 88-93 on RA, lethargic) PT Visit Diagnosis: Other abnormalities of gait and mobility (R26.89);Muscle weakness (generalized) (M62.81)    Time: 8119-1478 PT Time Calculation (min) (ACUTE ONLY): 28 min   Charges:   PT Evaluation $PT Eval Moderate Complexity: 1 Mod PT Treatments $Therapeutic Activity: 8-22 mins PT General Charges $$ ACUTE PT VISIT: 1 Visit         Tori Meghen Akopyan PT, DPT 11/13/23, 11:22 AM

## 2023-11-13 NOTE — Progress Notes (Signed)
 Pharmacist Chemotherapy Monitoring - Initial Assessment    Anticipated start date: 11/20/23    The following has been reviewed per standard work regarding the patient's treatment regimen: The patient's diagnosis, treatment plan and drug doses, and organ/hematologic function Lab orders and baseline tests specific to treatment regimen  The treatment plan start date, drug sequencing, and pre-medications Prior authorization status  Patient's documented medication list, including drug-drug interaction screen and prescriptions for anti-emetics and supportive care specific to the treatment regimen The drug concentrations, fluid compatibility, administration routes, and timing of the medications to be used The patient's access for treatment and lifetime cumulative dose history, if applicable  The patient's medication allergies and previous infusion related reactions, if applicable   Changes made to treatment plan:  N/A  Follow up needed:  1) F/U ECHO from 11/14/23 2) Auth for chemo? 3) Antiemetics for home use needed - Patient receiving Aloxi premed.   Jobe Mulder [place], [place], BCPS, BCOP 11/13/2023  3:53 PM

## 2023-11-13 NOTE — Progress Notes (Signed)
 Speech Language Pathology Treatment: Dysphagia  Patient Details Name: Heather Rogers MRN: [id number] DOB: 09-01-1945 Today's Date: 11/13/2023 Time: 1125-1150 SLP Time Calculation (min) (ACUTE ONLY): 25 min  Assessment / Plan / Recommendation Clinical Impression  Pt was seen with daughter at bedside, who describes difficulty especially with meats. She thinks that they need to be pureed. Upon further conversation, it sounds like pt was trying to pull other foods out of her mouth too, such as eggs. Pt's lunch tray is in her room and looks more like a mechanical soft tray, but SLP provided education to daughter and further chopped meat up. Also mixed it with gravy and puree. Pt has prolonged, incomplete mastication with pocketing of small, solid pieces of meat in her L buccal cavity. Verbal cues, liquid wash, and pureed washes attempted to try to clear the meat from her mouth. Liquid washes resulted in immediate coughing, but note that no coughing was observed when drinking thin liquids prior to engaging in solids (question difficulty coordinating with oral residue). Daughter verbalized concern that these are new symptoms compared to how she typically does at home. SLP recommends downgrade to pureed trays, and she is in agreement.    HPI HPI: 78 yo admitted with increasing dyspnea and abdominal pain from abdominal ascites. Pt has had persistent fevers and swallow eval was ordered. PMH includes: HTN, metastatic breast cancer, diabetes, CKD stage IIIb    Previous swallow eval March 2025 with no evidence of oropharyngeal dysphagia but did have eructation and h/o GERD.      SLP Plan  Continue with current plan of care      Recommendations for follow up therapy are one component of a multi-disciplinary discharge planning process, led by the attending physician.  Recommendations may be updated based on patient status, additional functional criteria and insurance authorization.    Recommendations   Diet recommendations: Dysphagia 1 (puree);Thin liquid Liquids provided via: Cup;Straw Medication Administration: Crushed with puree Supervision: Staff to assist with self feeding;Full supervision/cueing for compensatory strategies Compensations: Minimize environmental distractions;Slow rate;Small sips/bites Postural Changes and/or Swallow Maneuvers: Seated upright 90 degrees;Upright 30-60 min after meal                  Oral care BID     Dysphagia, unspecified (R13.10)     Continue with current plan of care     Beth Brooke., M.A. CCC-SLP Acute Rehabilitation Services Office: [phone]  Secure chat preferred   11/13/2023, 12:05 PM

## 2023-11-14 ENCOUNTER — Inpatient Hospital Stay (HOSPITAL_COMMUNITY)

## 2023-11-14 ENCOUNTER — Other Ambulatory Visit (HOSPITAL_COMMUNITY)

## 2023-11-14 DIAGNOSIS — Z515 Encounter for palliative care: Secondary | ICD-10-CM | POA: Diagnosis not present

## 2023-11-14 DIAGNOSIS — R1110 Vomiting, unspecified: Secondary | ICD-10-CM

## 2023-11-14 DIAGNOSIS — R509 Fever, unspecified: Secondary | ICD-10-CM

## 2023-11-14 DIAGNOSIS — C50912 Malignant neoplasm of unspecified site of left female breast: Secondary | ICD-10-CM | POA: Diagnosis not present

## 2023-11-14 DIAGNOSIS — Z7189 Other specified counseling: Secondary | ICD-10-CM

## 2023-11-14 DIAGNOSIS — R531 Weakness: Secondary | ICD-10-CM

## 2023-11-14 DIAGNOSIS — N179 Acute kidney failure, unspecified: Secondary | ICD-10-CM | POA: Diagnosis not present

## 2023-11-14 DIAGNOSIS — R180 Malignant ascites: Secondary | ICD-10-CM

## 2023-11-14 DIAGNOSIS — C787 Secondary malignant neoplasm of liver and intrahepatic bile duct: Principal | ICD-10-CM

## 2023-11-14 DIAGNOSIS — C50919 Malignant neoplasm of unspecified site of unspecified female breast: Secondary | ICD-10-CM | POA: Diagnosis not present

## 2023-11-14 DIAGNOSIS — J690 Pneumonitis due to inhalation of food and vomit: Secondary | ICD-10-CM

## 2023-11-14 LAB — BASIC METABOLIC PANEL WITH GFR
Anion gap: 9 (ref 5–15)
BUN: 33 mg/dL — ABNORMAL HIGH (ref 8–23)
CO2: 17 mmol/L — ABNORMAL LOW (ref 22–32)
Calcium: 12.4 mg/dL — ABNORMAL HIGH (ref 8.9–10.3)
Chloride: 119 mmol/L — ABNORMAL HIGH (ref 98–111)
Creatinine, Ser: 1.04 mg/dL — ABNORMAL HIGH (ref 0.44–1.00)
GFR, Estimated: 55 mL/min — ABNORMAL LOW (ref >60)
Glucose, Bld: 142 mg/dL — ABNORMAL HIGH (ref 70–99)
Potassium: 4.8 mmol/L (ref 3.5–5.1)
Sodium: 145 mmol/L (ref 135–145)

## 2023-11-14 LAB — CBC WITH DIFFERENTIAL/PLATELET
Abs Immature Granulocytes: 0.22 10*3/uL — ABNORMAL HIGH (ref 0.00–0.07)
Basophils Absolute: 0.1 10*3/uL (ref 0.0–0.1)
Basophils Relative: 1 %
Eosinophils Absolute: 0.1 10*3/uL (ref 0.0–0.5)
Eosinophils Relative: 1 %
HCT: 27.6 % — ABNORMAL LOW (ref 36.0–46.0)
Hemoglobin: 8.6 g/dL — ABNORMAL LOW (ref 12.0–15.0)
Immature Granulocytes: 2 %
Lymphocytes Relative: 12 %
Lymphs Abs: 1.3 10*3/uL (ref 0.7–4.0)
MCH: 25.4 pg — ABNORMAL LOW (ref 26.0–34.0)
MCHC: 31.2 g/dL (ref 30.0–36.0)
MCV: 81.4 fL (ref 80.0–100.0)
Monocytes Absolute: 1.8 10*3/uL — ABNORMAL HIGH (ref 0.1–1.0)
Monocytes Relative: 16 %
Neutro Abs: 7.4 10*3/uL (ref 1.7–7.7)
Neutrophils Relative %: 68 %
Platelets: 229 10*3/uL (ref 150–400)
RBC: 3.39 MIL/uL — ABNORMAL LOW (ref 3.87–5.11)
RDW: 18.4 % — ABNORMAL HIGH (ref 11.5–15.5)
WBC: 10.9 10*3/uL — ABNORMAL HIGH (ref 4.0–10.5)
nRBC: 2.4 % — ABNORMAL HIGH (ref 0.0–0.2)

## 2023-11-14 LAB — GLUCOSE, CAPILLARY
Glucose-Capillary: 146 mg/dL — ABNORMAL HIGH (ref 70–99)
Glucose-Capillary: 139 mg/dL — ABNORMAL HIGH (ref 70–99)
Glucose-Capillary: 157 mg/dL — ABNORMAL HIGH (ref 70–99)
Glucose-Capillary: 154 mg/dL — ABNORMAL HIGH (ref 70–99)

## 2023-11-14 LAB — MAGNESIUM: Magnesium: 3.1 mg/dL — ABNORMAL HIGH (ref 1.7–2.4)

## 2023-11-14 MED ORDER — LIDOCAINE HCL 1 % IJ SOLN
INTRAMUSCULAR | Status: AC
  Filled 2023-11-14: qty 20

## 2023-11-14 MED ORDER — DEXTROSE-SODIUM CHLORIDE 5-0.45 % IV SOLN: INTRAVENOUS | Status: AC

## 2023-11-14 MED ORDER — CALCITONIN (SALMON) 200 UNIT/ML IJ SOLN
4.0000 [IU]/kg | Freq: Two times a day (BID) | INTRAMUSCULAR | Status: AC
  Administered 2023-11-14 – 2023-11-15 (×4): 284 [IU] via INTRAMUSCULAR
  Filled 2023-11-14 (×4): qty 1.42

## 2023-11-14 MED ORDER — IBUPROFEN 200 MG PO TABS
400.0000 mg | ORAL_TABLET | Freq: Once | ORAL | Status: AC
  Administered 2023-11-14: 400 mg via ORAL
  Filled 2023-11-14: qty 2

## 2023-11-14 NOTE — Procedures (Signed)
 PROCEDURE SUMMARY:  Successful image-guided paracentesis from the right lower abdomen.  Yielded 1.4 liters of yellow fluid.  No immediate complications.  EBL: trace Patient tolerated well.   Specimen not sent for labs.  Please see imaging section of Epic for full dictation.  Damian Duke Shelsie Tijerino PA-C 11/14/2023 4:14 PM

## 2023-11-14 NOTE — Plan of Care (Signed)

## 2023-11-14 NOTE — Plan of Care (Signed)
  Problem: Education: Goal: Knowledge of General Education information will improve Description: Including pain rating scale, medication(s)/side effects and non-pharmacologic comfort measures Outcome: Progressing   Problem: Clinical Measurements: Goal: Respiratory complications will improve Outcome: Progressing Goal: Cardiovascular complication will be avoided Outcome: Progressing   Problem: Nutrition: Goal: Adequate nutrition will be maintained Outcome: Progressing   Problem: Coping: Goal: Level of anxiety will decrease Outcome: Progressing   Problem: Elimination: Goal: Will not experience complications related to bowel motility Outcome: Progressing   Problem: Pain Managment: Goal: General experience of comfort will improve and/or be controlled Outcome: Progressing   Problem: Safety: Goal: Ability to remain free from injury will improve Outcome: Progressing

## 2023-11-14 NOTE — Progress Notes (Signed)
 ONCOLOGY  S: Patient has developed recurrent ascites and is on her way to undergo paracentesis today. She continues to have intermittent fevers.  Previous paracentesis removed 1.3 L of fluid.  She threw up earlier in the day and there is concern for aspiration pneumonia.  She is confused and is no longer able to make her decisions.  Her daughter Heather Rogers was in the room and I spoke to her at length.     11/14/2023    1:20 PM 11/14/2023    4:57 AM 11/13/2023   10:05 PM  Vitals with BMI  Systolic 139 154 161  Diastolic 58 57 54  Pulse 72 72 68      Latest Ref Rng & Units 11/14/2023   10:37 AM 11/13/2023    5:41 AM 11/12/2023    5:38 AM  CBC  WBC 4.0 - 10.5 K/uL 10.9  11.9  17.1   Hemoglobin 12.0 - 15.0 g/dL 8.6  7.6  8.9   Hematocrit 36.0 - 46.0 % 27.6  23.5  27.3   Platelets 150 - 400 K/uL 229  209  215       Latest Ref Rng & Units 11/14/2023    6:53 AM 11/13/2023    5:41 AM 11/12/2023    5:38 AM  CMP  Glucose 70 - 99 mg/dL 096  71  79   BUN 8 - 23 mg/dL 33  41  51   Creatinine 0.44 - 1.00 mg/dL 0.45  4.09  8.11   Sodium 135 - 145 mmol/L 145  148  146   Potassium 3.5 - 5.1 mmol/L 4.8  3.6  4.3   Chloride 98 - 111 mmol/L 119  123  115   CO2 22 - 32 mmol/L 17  22  22    Calcium 8.9 - 10.3 mg/dL 91.4  78.2  95.6   Total Protein 6.5 - 8.1 g/dL  5.4  6.2   Total Bilirubin 0.0 - 1.2 mg/dL  0.9  1.1   Alkaline Phos 38 - 126 U/L  208  207   AST 15 - 41 U/L  113  155   ALT 0 - 44 U/L  61  83    Assessment and plan: Metastatic breast cancer with recurrent ascites: nausea, emesis and aspiration pneumonia.  Declining performance status, declining nutritional status and recurrent fluid buildup in the abdomen. Goals of care: Patient is not going to be able to receive/tolerate systemic treatments like chemotherapy.  I spoke to her daughter Heather Rogers who agreed that she is not going to be able to handle any systemic treatment.  We both agreed that she is best served in hospice care. Given the fact that  Heather Rogers is the only person who takes care of her, we recommend institutional hospice care. Expected life span: Days to weeks.

## 2023-11-14 NOTE — Progress Notes (Signed)
 Speech Language Pathology Treatment: Dysphagia  Patient Details Name: Heather Rogers MRN: [id number] DOB: Oct 30, 1945 Today's Date: 11/14/2023 Time: 1216-1226 SLP Time Calculation (min) (ACUTE ONLY): 10 min  Assessment / Plan / Recommendation Clinical Impression  Pt was quite lethargic today, requiring cueing to maintain alertness to trial POs. This may have contributed to prolonged oral holding. Pt perseverated on lifting her hand to touch her lips before she initiated a swallow but was unable to hold a cup/spoon to feed herself. No coughing was observed with thin liquids regardless of if they were given before or after solids. Suspect her performance may continue to be impacted by suspected esophageal factors throughout the course of a larger meal. Recommend continuing current Dys 1 diet with thin liquids. Discussed with pt's daughter, who is concerned about repetitive behavior and disorientation. Education was provided and SLP will continue following to assess ability to advance diet as mentation allows.    HPI HPI: 78 yo admitted with increasing dyspnea and abdominal pain from abdominal ascites. Pt has had persistent fevers and swallow eval was ordered. PMH includes: HTN, metastatic breast cancer, diabetes, CKD stage IIIb    Previous swallow eval March 2025 with no evidence of oropharyngeal dysphagia but did have eructation and h/o GERD.      SLP Plan  Continue with current plan of care      Recommendations for follow up therapy are one component of a multi-disciplinary discharge planning process, led by the attending physician.  Recommendations may be updated based on patient status, additional functional criteria and insurance authorization.    Recommendations  Diet recommendations: Dysphagia 1 (puree);Thin liquid Liquids provided via: Cup;Straw Medication Administration: Crushed with puree Supervision: Staff to assist with self feeding;Full supervision/cueing for compensatory  strategies Compensations: Minimize environmental distractions;Slow rate;Small sips/bites Postural Changes and/or Swallow Maneuvers: Seated upright 90 degrees;Upright 30-60 min after meal                  Oral care BID   Frequent or constant Supervision/Assistance Dysphagia, unspecified (R13.10)     Continue with current plan of care     Amil Kale, M.A., CCC-SLP Speech Language Pathology, Acute Rehabilitation Services  Secure Chat preferred [phone]   11/14/2023, 12:36 PM

## 2023-11-14 NOTE — Progress Notes (Signed)
 PROGRESS NOTE    Heather Rogers  ZOX:[id number] DOB: March 23, 1946 DOA: 11/07/2023 PCP: [name], MD   Brief Narrative:  78 y.o. female with medical history significant for hypertension, metastatic breast cancer, diabetes, CKD stage IIIb and recently hospitalized in March 2025 with C. difficile colitis presented to the hospital with worsening symptomatic abdominal ascites despite taking diuretics at home.  Workup in the ED showed moderate abdominal ascites.  She was admitted for the same.  She underwent ultrasound-guided paracentesis and removal of 1.3 L of yellow fluid on 11/08/2023 by IR.  Oncology planning to start chemotherapy as an outpatient.  Hospitalization complicated by possible aspiration pneumonia requiring IV Unasyn along with altered mental status.  Assessment & Plan:   Abdominal ascites Metastatic breast cancer Goals of care Failure to thrive/poor oral intake - Presumably malignant -underwent ultrasound-guided paracentesis and removal of 1.3 L of yellow fluid on 11/08/2023 by IR.  Cytology negative for malignant cells.  Initially started on Rocephin for SBP prophylaxis but ascitic fluid was negative for SBP -Oncology planning to start chemotherapy as an outpatient.   -Palliative care consultation is pending -Oral intake remains poor.  Dietitian consulted. - Abdomen looks more distended today.  Will request ultrasound-guided paracentesis again.  Hypercalcemia - Possibly from malignancy.  Patient received a dose of IV Zometa on 11/13/2023.  Labs pending today.  Currently also on IV fluids.  Repeat a.m. labs  Acute kidney injury -Likely prerenal.  Treated with IV fluids.  Resolved.  Probable aspiration pneumonia/pneumonitis -Continue Unasyn.  Diet as per SLP recommendations.  Acute metabolic encephalopathy/altered mental status - Possibly from all of the above along with hypercalcemia. - CT head was nonacute. - Still slow to respond and very sleepy.  Monitor mental  status.  Fall precautions.  Minimize sedating medications.  Gabapentin held.  Iron deficiency anemia -hemoglobin stable.  Monitor intermittently.  Continue oral supplementation  Hypertension - Continue amlodipine and Coreg  Diabetes mellitus type 2 - Continue CBGs with SSI.  Blood sugars well-controlled  Physical deconditioning -PT recommends home health PT.  DVT prophylaxis: Lovenox Code Status: Full Family Communication: Daughter at bedside on 11/13/2023. None at bedside today. Disposition Plan: Status is: Inpatient Remains inpatient appropriate because: Of severity of illness  Consultants: Oncology.  palliative care  Procedures: As above  Antimicrobials:  Anti-infectives (From admission, onward)    Start     Dose/Rate Route Frequency Ordered Stop   11/10/23 1130  Ampicillin-Sulbactam (UNASYN) 3 g in sodium chloride 0.9 % 100 mL IVPB        3 g 200 mL/hr over 30 Minutes Intravenous Every 8 hours 11/10/23 1034     11/08/23 1400  cefTRIAXone (ROCEPHIN) 2 g in sodium chloride 0.9 % 100 mL IVPB  Status:  Discontinued        2 g 200 mL/hr over 30 Minutes Intravenous Every 24 hours 11/08/23 1258 11/10/23 1030        Subjective: Patient seen and examined at bedside.  No fever, agitation, seizures reported.  Oral intake remains poor.   Objective: Vitals:   11/13/23 0500 11/13/23 1319 11/13/23 2205 11/14/23 0457  BP: (!) 141/59 121/61 (!) 138/54 (!) 154/57  Pulse: 68 62 68 72  Resp: 18 16 18 20   Temp: 98.6 F (37 C) 98.2 F (36.8 C) 98.4 F (36.9 C) 98.1 F (36.7 C)  TempSrc: Oral Oral Oral Oral  SpO2: 95% 98% 97% 98%  Weight:      Height:  Intake/Output Summary (Last 24 hours) at 11/14/2023 0749 Last data filed at 11/14/2023 0600 Gross per 24 hour  Intake 372.33 ml  Output 950 ml  Net -577.67 ml   Filed Weights   11/07/23 0956 11/07/23 1704  Weight: 70.8 kg 71 kg    Examination:  General: On room air.  No distress.  Chronically ill and  deconditioned looking. ENT/neck: No thyromegaly.  JVD is not elevated  respiratory: Decreased breath sounds at bases bilaterally with some crackles; no wheezing  CVS: S1-S2 heard, rate controlled currently Abdominal: Soft, nontender, slightly distended; no organomegaly, bowel sounds are heard Extremities: Trace lower extremity edema; no cyanosis  CNS: Awake, still very slow to respond and extremely poor historian.  No focal neurologic deficit.  Moves extremities Lymph: No obvious lymphadenopathy Skin: No obvious ecchymosis/lesions  psych: Currently not agitated with very flat affect. musculoskeletal: No obvious joint swelling/deformity    Data Reviewed: I have personally reviewed following labs and imaging studies  CBC: Recent Labs  Lab 11/07/23 1116 11/08/23 0454 11/09/23 1319 11/10/23 0809 11/11/23 0604 11/12/23 0538 11/13/23 0541  WBC 12.9*   < > 11.5* 12.6* 19.4* 17.1* 11.9*  NEUTROABS 9.7*  --   --   --   --   --   --   HGB 9.1*   < > 8.8* 8.8* 10.5* 8.9* 7.6*  HCT 28.2*   < > 28.0* 27.9* 34.5* 27.3* 23.5*  MCV 80.3   < > 81.4 82.3 84.6 78.7* 79.1*  PLT 300   < > 251 185 194 215 209   < > = values in this interval not displayed.   Basic Metabolic Panel: Recent Labs  Lab 11/09/23 1319 11/10/23 0809 11/11/23 0604 11/12/23 0538 11/13/23 0541  NA 136 138 143 146* 148*  K 4.9 4.5 4.5 4.3 3.6  CL 106 111 114* 115* 123*  CO2 19* 20* 20* 22 22  GLUCOSE 122* 129* 105* 79 71  BUN 36* 34* 43* 51* 41*  CREATININE 1.33* 1.41* 1.37* 1.32* 0.98  CALCIUM 11.1* 11.5* 11.9* 12.5* 11.4*  MG 2.4  --  2.8* 2.9*  --   PHOS 2.4*  --  3.2 3.2 2.1*   GFR: Estimated Creatinine Clearance: 43.7 mL/min (by C-G formula based on SCr of 0.98 mg/dL). Liver Function Tests: Recent Labs  Lab 11/08/23 0454 11/09/23 1319 11/10/23 0809 11/12/23 0538 11/13/23 0541  AST 177* 389* 351* 155* 113*  ALT 73* 123* 130* 83* 61*  ALKPHOS 186* 220* 227* 207* 208*  BILITOT 1.5* 1.4* 1.6* 1.1 0.9   PROT 6.0* 6.3* 5.7* 6.2* 5.4*  ALBUMIN 2.4* 2.3* 2.1* 2.1* 1.8*   No results for input(s): "LIPASE", "AMYLASE" in the last 168 hours. Recent Labs  Lab 11/08/23 2143  AMMONIA 26   Coagulation Profile: No results for input(s): "INR", "PROTIME" in the last 168 hours. Cardiac Enzymes: No results for input(s): "CKTOTAL", "CKMB", "CKMBINDEX", "TROPONINI" in the last 168 hours. BNP (last 3 results) No results for input(s): "PROBNP" in the last 8760 hours. HbA1C: No results for input(s): "HGBA1C" in the last 72 hours. CBG: Recent Labs  Lab 11/13/23 0756 11/13/23 1130 11/13/23 1618 11/13/23 2202 11/14/23 0739  GLUCAP 85 123* 100* 148* 146*   Lipid Profile: No results for input(s): "CHOL", "HDL", "LDLCALC", "TRIG", "CHOLHDL", "LDLDIRECT" in the last 72 hours. Thyroid Function Tests: No results for input(s): "TSH", "T4TOTAL", "FREET4", "T3FREE", "THYROIDAB" in the last 72 hours. Anemia Panel: No results for input(s): "VITAMINB12", "FOLATE", "FERRITIN", "TIBC", "IRON", "RETICCTPCT" in the  last 72 hours. Sepsis Labs: Recent Labs  Lab 11/08/23 2143  LATICACIDVEN 1.2    Recent Results (from the past 240 hours)  Body fluid culture w Gram Stain     Status: None   Collection Time: 11/08/23  2:52 PM   Specimen: PATH Cytology Pleural fluid  Result Value Ref Range Status   Specimen Description   Final    PLEURAL Performed at [facility], [address], [place], [place] [address]    Special Requests   Final    NONE Performed at [facility], [address], [place], [place] [address]    Gram Stain   Final    RARE WBC PRESENT,BOTH PMN AND MONONUCLEAR NO ORGANISMS SEEN    Culture   Final    NO GROWTH 3 DAYS Performed at [facility], [address], [place], [place] [address]    Report Status 11/11/2023 FINAL  Final  Culture, blood (Routine X 2) w Reflex to ID Panel     Status: None   Collection Time: 11/08/23  9:43 PM    Specimen: BLOOD LEFT HAND  Result Value Ref Range Status   Specimen Description   Final    BLOOD LEFT HAND Performed at [facility], [address], [place], [place] [address]    Special Requests   Final    BOTTLES DRAWN AEROBIC AND ANAEROBIC Blood Culture results may not be optimal due to an inadequate volume of blood received in culture bottles Performed at [facility], [address] [place], [place] [address]    Culture   Final    NO GROWTH 5 DAYS Performed at [facility], [address], [place], [place] [address]    Report Status 11/13/2023 FINAL  Final  Culture, blood (Routine X 2) w Reflex to ID Panel     Status: None   Collection Time: 11/08/23  9:43 PM   Specimen: BLOOD LEFT HAND  Result Value Ref Range Status   Specimen Description   Final    BLOOD LEFT HAND Performed at [facility], [address], [place], [place] [address]    Special Requests   Final    BOTTLES DRAWN AEROBIC AND ANAEROBIC Blood Culture results may not be optimal due to an inadequate volume of blood received in culture bottles Performed at [facility], [address] [place], [place] [address]    Culture   Final    NO GROWTH 5 DAYS Performed at [facility], [address] [place], [place] [address]    Report Status 11/13/2023 FINAL  Final  Culture, blood (Routine X 2) w Reflex to ID Panel     Status: None (Preliminary result)   Collection Time: 11/10/23 11:19 AM   Specimen: BLOOD LEFT ARM  Result Value Ref Range Status   Specimen Description   Final    BLOOD LEFT ARM BLOOD LEFT HAND Performed at [facility], [address], [place], [place] [address]    Special Requests   Final    BOTTLES DRAWN AEROBIC ONLY Blood Culture results may not be optimal due to an inadequate volume of blood received in culture bottles Performed at [facility], [address] [place], [place] [address]     Culture   Final    NO GROWTH 3 DAYS Performed at [facility], [address] [place], [place] [address]  Report Status PENDING  Incomplete  Culture, blood (Routine X 2) w Reflex to ID Panel     Status: None (Preliminary result)   Collection Time: 11/10/23 11:19 AM   Specimen: BLOOD  Result Value Ref Range Status   Specimen Description   Final    BLOOD BLOOD LEFT HAND Performed at [facility], [address] [place], [place] [address]    Special Requests   Final    BOTTLES DRAWN AEROBIC ONLY Blood Culture results may not be optimal due to an inadequate volume of blood received in culture bottles Performed at [facility], [address], [place], [place] [address]    Culture   Final    NO GROWTH 3 DAYS Performed at [facility], [address], [place], [place] [address]    Report Status PENDING  Incomplete         Radiology Studies: CT HEAD WO CONTRAST ( ) Result Date: 11/12/2023 CLINICAL DATA:  Provided history: Mental status change, unknown cause. Additional history provided: Metastatic breast cancer, fever, confusion. EXAM: CT HEAD WITHOUT CONTRAST TECHNIQUE: Contiguous axial images were obtained from the base of the skull through the vertex without intravenous contrast. RADIATION DOSE REDUCTION: This exam was performed according to the departmental dose-optimization program which includes automated exposure control, adjustment of the mA and/or kV according to patient size and/or use of iterative reconstruction technique. COMPARISON:  Head CT 09/28/2023. Report from MRI brain and MRA head 05/15/2018. FINDINGS: Brain: Generalized cerebral atrophy. Chronic cortical/subcortical left PCA territory infarct again demonstrated within the left occipital lobe. Background mild patchy and ill-defined hypoattenuation within the cerebral white matter, nonspecific but compatible with chronic small vessel ischemic disease There is no acute  intracranial hemorrhage. No acute demarcated cortical infarct. No extra-axial fluid collection. No evidence of an intracranial mass. No midline shift. Vascular: No hyperdense vessel.  Atherosclerotic calcifications. Skull: No calvarial fracture or aggressive osseous lesion. Sinuses/Orbits: No mass or acute finding within the imaged orbits. Trace mucosal thickening within left ethmoid air cells. IMPRESSION: 1. No evidence of an acute intracranial abnormality. 2. Known chronic left PCA territory infarct. 3. Background parenchymal atrophy and chronic small vessel ischemic disease. Electronically Signed   By: Bascom Lily D.O.   On: 11/12/2023 09:24        Scheduled Meds:  amLODipine  10 mg Oral Daily   aspirin EC  81 mg Oral Daily   carvedilol  25 mg Oral BID WC   Chlorhexidine Gluconate Cloth  6 each Topical Daily   enoxaparin (LOVENOX) injection  40 mg Subcutaneous Q24H   feeding supplement  237 mL Oral BID BM   ferrous sulfate  325 mg Oral Q breakfast   insulin aspart  0-15 Units Subcutaneous TID WC   insulin aspart  0-5 Units Subcutaneous QHS   multivitamin with minerals  1 tablet Oral Daily   mouth rinse  15 mL Mouth Rinse 4 times per day   Continuous Infusions:  ampicillin-sulbactam (UNASYN) IV 3 g (11/14/23 0121)   dextrose 5 % and 0.45 % NaCl 50 mL/hr at 11/13/23 1149          Chasitty Hehl, MD [facility] 11/14/2023, 7:49 AM

## 2023-11-14 NOTE — TOC Progression Note (Signed)
 Transition of Care [facility]) - Progression Note    Patient Details  Name: Heather Rogers MRN: [id number] Date of Birth: Sep 10, 1945  Transition of Care [facility] CM/SW Contact  Loreda Rodriguez, RN Phone Number:[phone]  11/14/2023, 4:13 PM  Clinical Narrative:    CM received consult for residential hospice. CM at bedside to offer choice. Daughter states that she needs to discuss options with family and determine if patietn will go home with hospice or residential hospice. TOC will follow up in the am.    Expected Discharge Plan: Home w Home Health Services Barriers to Discharge: Continued Medical Work up  Expected Discharge Plan and Services In-house Referral: NA Discharge Planning Services: CM Consult Post Acute Care Choice: NA Living arrangements for the past 2 months: Single Family Home                 DME Arranged: N/A DME Agency: NA       HH Arranged: NA HH Agency: NA         Social Determinants of Health (SDOH) Interventions SDOH Screenings   Food Insecurity: No Food Insecurity (11/07/2023)  Housing: Low Risk  (11/07/2023)  Transportation Needs: No Transportation Needs (11/07/2023)  Utilities: Not At Risk (11/07/2023)  Alcohol Screen: Low Risk  (05/22/2023)  Depression (PHQ2-9): Low Risk  (10/23/2023)  Financial Resource Strain: Low Risk  (08/22/2023)  Physical Activity: Inactive (08/22/2023)  Social Connections: Unknown (11/07/2023)  Stress: No Stress Concern Present (08/22/2023)  Tobacco Use: Medium Risk (11/07/2023)  Health Literacy: Adequate Health Literacy (05/22/2023)    Readmission Risk Interventions    11/08/2023    3:30 PM  Readmission Risk Prevention Plan  Transportation Screening Complete  PCP or Specialist Appt within 3-5 Days Complete  HRI or Home Care Consult Complete  Social Work Consult for Recovery Care Planning/Counseling Complete  Medication Review Oceanographer) Referral to Pharmacy

## 2023-11-14 NOTE — Consult Note (Signed)
 Consultation Note Date: 11/14/2023   Patient Name: Heather Rogers  DOB: September 29, 1945  MRN: [id number]  Age / Sex: 78 y.o., female  PCP: Joaquin Mulberry, MD Referring Physician: Audria Leather, MD  Reason for Consultation: Establishing goals of care  HPI/Patient Profile: 78 y.o. female admitted on 11/07/2023    Clinical Assessment and Goals of Care: 77 year old lady who is under the care of Dr. Gudena for metastatic breast cancer with recurrent ascites.  More recently the patient has been having nausea, emesis, ongoing functional decline, declining nutritional status, possible aspiration pneumonia and recurrent fluid buildup in the abdomen necessitating recurrent paracenteses attempts. Patient lives at home with grandson, daughter [name] is primary caregiver who takes care of of the patient. Palliative consult for ongoing assistance with goals of care discussions and CODE STATUS discussions has been requested. Chart reviewed, patient noted to be an elderly weak appearing lady, abdominal distention, currently being taken for another paracentesis attempt. I sat down and discussed with her daughter [name] with regards to recent oncology recommendations for consideration of comfort measures as well as a hospice arrangements. Palliative medicine is specialized medical care for people living with serious illness. It focuses on providing relief from the symptoms and stress of a serious illness. The goal is to improve quality of life for both the patient and the family. Goals of care: Broad aims of medical therapy in relation to the patient's values and preferences. Our aim is to provide medical care aimed at enabling patients to achieve the goals that matter most to them, given the circumstances of their particular medical situation and their constraints.    NEXT OF KIN  Daughter [name] at bedside.   SUMMARY OF  RECOMMENDATIONS   Goals of care discussions: Discussed frankly but compassionately with the patient's daughter [name] about the patient's current condition as well as serious underlying illness and irreversible life limiting illness of metastatic malignancy.  We reviewed about patient's ongoing decline and current condition.  [name] is quite tearful.  She recognizes that the patient is not a candidate to receive or tolerate any further systemic treatments like chemotherapy.  She also agrees with that the patient will be best served by the addition of hospice services at this point in time. Additionally, we reviewed about CODE STATUS in full detail.  Extensively discussed about full code versus DNR/DNI.  Discussed at that in my opinion, the patient will not survive the full scope of her resuscitative attempt and that it would lead to more harm and discomfort.  Hence, made recommendation for DNR/DNI. Additionally, discussed about 2 levels of hospice support-home with hospice versus residential hospice.  We compared and contrasted both.  We discussed about the residential hospice option in detail.  Offered medical recommendation for considering residential hospice. Plan: Patient's daughter asks for time and space for reflection and for discussion amongst her siblings as well as other family members with regards to establishing DNR/DNI and with regards to pursuing residential hospice.  PMT will follow-up in a.m. on 11-15-2023.Brief life review  performed.  Attempted to get to know the patient better.  Patient's daughter states that for various personal reasons, patient should never be given morphine even if she goes to a hospice facility.  She is agreeable to using other opioids such as hydromorphone or fentanyl. Thank you for the consult.  Code Status/Advance Care Planning: Full code Extensive discussion about DNR DNI today, with daughter, at the time of initial conversation.   Symptom Management:      Palliative Prophylaxis:  Delirium Protocol   Psycho-social/Spiritual:  Desire for further Chaplaincy support:yes Additional Recommendations: Education on Hospice  Prognosis:  < 2 weeks  Discharge Planning: home with hospice versus residential hospice.       Primary Diagnoses: Present on Admission:  Malignant ascites   I have reviewed the medical record, interviewed the patient and family, and examined the patient. The following aspects are pertinent.  Past Medical History:  Diagnosis Date   Chronic kidney disease, stage 3b (HCC)    Diabetes (HCC)    Hypertension    Pneumonia due to COVID-19 virus 07/25/2020   Primary malignant neoplasm of breast with metastasis (HCC) 09/14/2021   Stroke (cerebrum) (HCC)    Stroke (HCC) 05/15/2018   Social History   Socioeconomic History   Marital status: Divorced    Spouse name: Not on file   Number of children: Not on file   Years of education: Not on file   Highest education level: 12th grade  Occupational History   Not on file  Tobacco Use   Smoking status: Former    Current packs/day: 0.00    Average packs/day: 1 pack/day for 15.0 years (15.0 ttl pk-yrs)    Types: Cigarettes    Start date: 07/30/1970    Quit date: 07/30/1985    Years since quitting: 38.3   Smokeless tobacco: Never  Vaping Use   Vaping status: Never Used  Substance and Sexual Activity   Alcohol use: Never   Drug use: Never   Sexual activity: Not Currently  Other Topics Concern   Not on file  Social History Narrative   Lives in [place]; relies on daughter for transportation; grandson lives with her    Social Drivers of Health   Financial Resource Strain: Low Risk  (08/22/2023)   Overall Financial Resource Strain (CARDIA)    Difficulty of Paying Living Expenses: Not hard at all  Food Insecurity: No Food Insecurity (11/07/2023)   Hunger Vital Sign    Worried About Running Out of Food in the Last Year: Never true    Ran Out of Food in the Last  Year: Never true  Transportation Needs: No Transportation Needs (11/07/2023)   PRAPARE - Administrator, Civil Service (Medical): No    Lack of Transportation (Non-Medical): No  Physical Activity: Inactive (08/22/2023)   Exercise Vital Sign    Days of Exercise per Week: 0 days    Minutes of Exercise per Session: 0 min  Stress: No Stress Concern Present (08/22/2023)   [employer] of Occupational Health - Occupational Stress Questionnaire    Feeling of Stress : Not at all  Social Connections: Unknown (11/07/2023)   Social Connection and Isolation Panel [NHANES]    Frequency of Communication with Friends and Family: More than three times a week    Frequency of Social Gatherings with Friends and Family: More than three times a week    Attends Religious Services: More than 4 times per year    Active Member of [employer] or Organizations:  Patient declined    Attends Banker Meetings: More than 4 times per year    Marital Status: Patient declined   Family History  Problem Relation Age of Onset   Hypertension Mother    Stroke Mother    Hypertension Father    Scheduled Meds:  amLODipine  10 mg Oral Daily   aspirin EC  81 mg Oral Daily   calcitonin  4 Units/kg Intramuscular BID   carvedilol  25 mg Oral BID WC   Chlorhexidine Gluconate Cloth  6 each Topical Daily   enoxaparin (LOVENOX) injection  40 mg Subcutaneous Q24H   feeding supplement  237 mL Oral BID BM   ferrous sulfate  325 mg Oral Q breakfast   insulin aspart  0-15 Units Subcutaneous TID WC   insulin aspart  0-5 Units Subcutaneous QHS   multivitamin with minerals  1 tablet Oral Daily   mouth rinse  15 mL Mouth Rinse 4 times per day   Continuous Infusions:  ampicillin-sulbactam (UNASYN) IV 3 g (11/14/23 0933)   dextrose 5 % and 0.45 % NaCl 50 mL/hr at 11/14/23 0956   PRN Meds:.acetaminophen **OR** acetaminophen, albuterol, ondansetron **OR** ondansetron (ZOFRAN) IV, mouth rinse, oxyCODONE,  traZODone Medications Prior to Admission:  Prior to Admission medications   Medication Sig Start Date End Date Taking? Authorizing Provider  allopurinol (ZYLOPRIM) 100 MG tablet Take 1 tablet (100 mg total) by mouth daily. 02/19/23  Yes [name], MD  amLODipine (NORVASC) 10 MG tablet Take 1 tablet (10 mg total) by mouth daily. 02/19/23  Yes [name], MD  aspirin EC 81 MG tablet Take 81 mg by mouth in the morning. Swallow whole.   Yes [provider]  atorvastatin (LIPITOR) 80 MG tablet TAKE 1 TABLET EVERY EVENING 04/22/23  Yes [name], MD  carvedilol (COREG) 25 MG tablet Take 1 tablet (25 mg total) by mouth 2 (two) times daily with a meal. 02/19/23  Yes Newlin, Enobong, MD  cholecalciferol (VITAMIN D3) 25 MCG (1000 UNIT) tablet Take 1,000 Units by mouth daily.   Yes [provider]  ferrous sulfate 325 (65 FE) MG EC tablet Take 1 tablet (325 mg total) by mouth in the morning and at bedtime. 05/22/21  Yes Mayers, Cari S, PA-C  furosemide (LASIX) 20 MG tablet Take 1 tablet (20 mg total) by mouth daily. For  3 to 5 days prn leg swelling 10/23/23  Yes Anders Simmonds, PA-C  gabapentin (NEURONTIN) 100 MG capsule Take 1 capsule (100 mg total) by mouth 3 (three) times daily. 02/19/23  Yes [name], MD  metFORMIN (GLUCOPHAGE) 500 MG tablet Take 1 tablet (500 mg total) by mouth daily with breakfast. 08/22/23  Yes [name], MD  Multiple Vitamin (MULTIVITAMIN WITH MINERALS) TABS tablet Take 1 tablet by mouth daily. 09/09/21  Yes Ghimire, Werner Lean, MD  ondansetron (ZOFRAN) 8 MG tablet Take 1 tablet (8 mg total) by mouth every 8 (eight) hours as needed. 10/06/23  Yes Shalhoub, Deno Lunger, MD  polyethylene glycol (MIRALAX / GLYCOLAX) 17 g packet Take 17 g by mouth daily.   Yes Serena Croissant, MD   Allergies  Allergen Reactions   Sulfa Antibiotics Itching   Review of Systems Weakness  Physical Exam Abdominal distension Weakness  Vital Signs: BP (!) 85/49 (BP  Location: Left Leg)   Pulse 72   Temp 97.8 F (36.6 C) (Axillary)   Resp 16   Ht 5\' 2"  (1.575 m)   Wt 71 kg  SpO2 97%   BMI 28.63 kg/m  Pain Scale: 0-10 POSS *See Group Information*: 2-Acceptable,Slightly drowsy, easily aroused Pain Score: 0-No pain   SpO2: SpO2: 97 % O2 Device:SpO2: 97 % O2 Flow Rate: .   IO: Intake/output summary:  Intake/Output Summary (Last 24 hours) at 11/14/2023 1612 Last data filed at 11/14/2023 1500 Gross per 24 hour  Intake 303.33 ml  Output 900 ml  Net -596.67 ml    LBM: Last BM Date : 11/13/23 Baseline Weight: Weight: 70.8 kg Most recent weight: Weight: 71 kg     Palliative Assessment/Data:   PPS 30%  Time In:  1500 Time Out:  1600 Time Total:  60  Greater than 50%  of this time was spent counseling and coordinating care related to the above assessment and plan.  Signed by: Lujean Sake, MD   Please contact Palliative Medicine Team phone at [phone] for questions and concerns.  For individual provider: See Tilford Foley

## 2023-11-14 NOTE — Progress Notes (Signed)
 OT Cancellation Note  Patient Details Name: Heather Rogers MRN: [id number] DOB: 1946-01-06   Cancelled Treatment:    Reason Eval/Treat Not Completed: Patient at procedure or test/ unavailable. Pt is off the unit for a paracentesis.   Sheralyn Dies, OTR/L 11/14/2023, 4:29 PM

## 2023-11-15 ENCOUNTER — Other Ambulatory Visit (HOSPITAL_COMMUNITY)

## 2023-11-15 ENCOUNTER — Inpatient Hospital Stay (HOSPITAL_COMMUNITY)

## 2023-11-15 DIAGNOSIS — C50911 Malignant neoplasm of unspecified site of right female breast: Secondary | ICD-10-CM | POA: Diagnosis not present

## 2023-11-15 DIAGNOSIS — R180 Malignant ascites: Secondary | ICD-10-CM | POA: Diagnosis not present

## 2023-11-15 LAB — CBC WITH DIFFERENTIAL/PLATELET
Abs Immature Granulocytes: 0.29 10*3/uL — ABNORMAL HIGH (ref 0.00–0.07)
Basophils Absolute: 0.1 10*3/uL (ref 0.0–0.1)
Basophils Relative: 0 %
Eosinophils Absolute: 0.1 10*3/uL (ref 0.0–0.5)
Eosinophils Relative: 1 %
HCT: 27 % — ABNORMAL LOW (ref 36.0–46.0)
Hemoglobin: 8.1 g/dL — ABNORMAL LOW (ref 12.0–15.0)
Immature Granulocytes: 2 %
Lymphocytes Relative: 12 %
Lymphs Abs: 1.4 10*3/uL (ref 0.7–4.0)
MCH: 24.8 pg — ABNORMAL LOW (ref 26.0–34.0)
MCHC: 30 g/dL (ref 30.0–36.0)
MCV: 82.8 fL (ref 80.0–100.0)
Monocytes Absolute: 1.7 10*3/uL — ABNORMAL HIGH (ref 0.1–1.0)
Monocytes Relative: 14 %
Neutro Abs: 8.8 10*3/uL — ABNORMAL HIGH (ref 1.7–7.7)
Neutrophils Relative %: 71 %
Platelets: 226 10*3/uL (ref 150–400)
RBC: 3.26 MIL/uL — ABNORMAL LOW (ref 3.87–5.11)
RDW: 18.6 % — ABNORMAL HIGH (ref 11.5–15.5)
WBC: 12.4 10*3/uL — ABNORMAL HIGH (ref 4.0–10.5)
nRBC: 1.3 % — ABNORMAL HIGH (ref 0.0–0.2)

## 2023-11-15 LAB — COMPREHENSIVE METABOLIC PANEL WITH GFR
ALT: 90 U/L — ABNORMAL HIGH (ref 0–44)
AST: 211 U/L — ABNORMAL HIGH (ref 15–41)
Albumin: 1.9 g/dL — ABNORMAL LOW (ref 3.5–5.0)
Alkaline Phosphatase: 240 U/L — ABNORMAL HIGH (ref 38–126)
Anion gap: 6 (ref 5–15)
BUN: 29 mg/dL — ABNORMAL HIGH (ref 8–23)
CO2: 22 mmol/L (ref 22–32)
Calcium: 11.9 mg/dL — ABNORMAL HIGH (ref 8.9–10.3)
Chloride: 119 mmol/L — ABNORMAL HIGH (ref 98–111)
Creatinine, Ser: 1.03 mg/dL — ABNORMAL HIGH (ref 0.44–1.00)
GFR, Estimated: 56 mL/min — ABNORMAL LOW (ref >60)
Glucose, Bld: 164 mg/dL — ABNORMAL HIGH (ref 70–99)
Potassium: 3.9 mmol/L (ref 3.5–5.1)
Sodium: 147 mmol/L — ABNORMAL HIGH (ref 135–145)
Total Bilirubin: 1.2 mg/dL (ref 0.0–1.2)
Total Protein: 5.7 g/dL — ABNORMAL LOW (ref 6.5–8.1)

## 2023-11-15 LAB — GLUCOSE, CAPILLARY
Glucose-Capillary: 153 mg/dL — ABNORMAL HIGH (ref 70–99)
Glucose-Capillary: 142 mg/dL — ABNORMAL HIGH (ref 70–99)
Glucose-Capillary: 152 mg/dL — ABNORMAL HIGH (ref 70–99)
Glucose-Capillary: 173 mg/dL — ABNORMAL HIGH (ref 70–99)

## 2023-11-15 LAB — CULTURE, BLOOD (ROUTINE X 2)
Culture: NO GROWTH
Culture: NO GROWTH

## 2023-11-15 LAB — ECHOCARDIOGRAM COMPLETE
Height: 62 in
Weight: 2504.43 [oz_av]

## 2023-11-15 LAB — MAGNESIUM: Magnesium: 2.2 mg/dL (ref 1.7–2.4)

## 2023-11-15 NOTE — Progress Notes (Signed)
 Attempted Echocardiogram, The patient's daughter explained that comfort care was decided and the Echocardiogram is not needed.

## 2023-11-15 NOTE — Plan of Care (Signed)

## 2023-11-15 NOTE — Plan of Care (Signed)
  Problem: Clinical Measurements: Goal: Will remain free from infection Outcome: Progressing Goal: Respiratory complications will improve Outcome: Progressing Goal: Cardiovascular complication will be avoided Outcome: Progressing   Problem: Nutrition: Goal: Adequate nutrition will be maintained Outcome: Progressing   Problem: Elimination: Goal: Will not experience complications related to urinary retention Outcome: Progressing   Problem: Pain Managment: Goal: General experience of comfort will improve and/or be controlled Outcome: Progressing

## 2023-11-15 NOTE — Evaluation (Signed)
 Occupational Therapy Evaluation Patient Details Name: Heather Rogers MRN: [id number] DOB: October 31, 1945 Today's Date: 11/15/2023   History of Present Illness   Heather Rogers is a 78 y.o. female admitted with malignant ascites. S/p Successful image-guided paracentesis from the right lower abdomen 4/11. Head CT 4/15: No evidence of an acute intracranial abnormality; Known chronic left PCA territory infarct. PMH: HTN, metastatic breast cancer, diabetes, CKD stage IIIb and recently hospitalized in March 2025 with C. difficile colitis     Clinical Impressions PTA, patient was living with grandson and participating in [facility] and PT services using RW for mobility and completing BADL's with minimal assistance as per daughter Artice Last report and chart review. OT chart review prior to eval, patient will be discharged home with Home Hospice services recommended. During OT evaluation, Daughter present for all training and patient open to bed level activity due to fatigue and weakness with attempt to transfer. Patient required max-total assistance for grooming and self feeding as well as all BADL's. OT educated on maximizing patient's simple ability ie face washing and cup/spoon to mouth when alert as well as skin protection and bed positioning. Based on current status and home hospice rec, no Acute nor further OT needs identified. OT signing off.      If plan is discharge home, recommend the following:   Two people to help with walking and/or transfers;Two people to help with bathing/dressing/bathroom;Assistance with feeding;Direct supervision/assist for medications management;Direct supervision/assist for financial management;Assist for transportation;Help with stairs or ramp for entrance;Supervision due to cognitive status     Functional Status Assessment   Patient has had a recent decline in their functional status and demonstrates the ability to make significant improvements in function in a  reasonable and predictable amount of time.     Equipment Recommendations   Hospital bed      Precautions/Restrictions   Precautions Precautions: Fall Recall of Precautions/Restrictions: Impaired Restrictions Weight Bearing Restrictions Per Provider Order: No     Mobility Bed Mobility Overal bed mobility: Needs Assistance Bed Mobility: Rolling, Supine to Sit, Sit to Supine Rolling: Max assist   Supine to sit: Total assist, HOB elevated, Used rails Sit to supine: +2 for physical assistance, Total assist, +2 for safety/equipment, HOB elevated, Used rails        Transfers                   General transfer comment: unable to tolerate OOB due to significant weakness      Balance Overall balance assessment: Needs assistance Sitting-balance support: Bilateral upper extremity supported Sitting balance-Leahy Scale: Zero                                     ADL either performed or assessed with clinical judgement   ADL Overall ADL's : Needs assistance/impaired Eating/Feeding: Maximal assistance;With caregiver independent assisting;Cueing for sequencing;Cueing for compensatory techinques;Bed level   Grooming: Wash/dry hands;Wash/dry face;Moderate assistance;Cueing for sequencing;Bed level   Upper Body Bathing: Total assistance;Bed level   Lower Body Bathing: Total assistance;Bed level   Upper Body Dressing : Maximal assistance;Bed level   Lower Body Dressing: Total assistance;Bed level   Toilet Transfer: +2 for physical assistance;+2 for safety/equipment;Total assistance   Toileting- Clothing Manipulation and Hygiene: Total assistance;Bed level       Functional mobility during ADLs:  (unable to move to EOB this session without + 2 and total assistance due to significant weakness  in trunk and B UE/LEs) General ADL Comments: only able to participate in minimal self feeding and light grooming this session due to fatigue     Vision Baseline  Vision/History: 0 No visual deficits Ability to See in Adequate Light: 0 Adequate Patient Visual Report: No change from baseline Vision Assessment?: No apparent visual deficits     Perception Perception: Impaired Preception Impairment Details: Spatial orientation, Figure ground     Praxis Praxis: Impaired Praxis Impairment Details: Organization, Motor planning, Initiation     Pertinent Vitals/Pain Pain Assessment Pain Assessment: Faces Faces Pain Scale: No hurt Breathing: normal Negative Vocalization: occasional moan/groan, low speech, negative/disapproving quality Facial Expression: smiling or inexpressive Body Language: relaxed Consolability: no need to console PAINAD Score: 1 Pain Intervention(s): Monitored during session, Relaxation     Extremity/Trunk Assessment Upper Extremity Assessment Upper Extremity Assessment: Generalized weakness;Right hand dominant   Lower Extremity Assessment Lower Extremity Assessment: Generalized weakness   Cervical / Trunk Assessment Cervical / Trunk Assessment: Kyphotic   Communication Communication Communication: No apparent difficulties   Cognition Arousal: Obtunded Behavior During Therapy: Flat affect Cognition: Cognition impaired   Orientation impairments: Person Awareness: Intellectual awareness impaired, Online awareness impaired Memory impairment (select all impairments): Short-term memory, Working memory Attention impairment (select first level of impairment): Focused attention Executive functioning impairment (select all impairments): Initiation, Organization, Sequencing, Reasoning, Problem solving OT - Cognition Comments: slow processing with increased delay in simple responses                 Following commands: Impaired Following commands impaired: Follows one step commands with increased time     Cueing  General Comments   Cueing Techniques: Verbal cues;Tactile cues;Visual cues  no pain or SOB thoughout  session, educated daughter on positioning and skin protection           Home Living Family/patient expects to be discharged to:: Private residence Living Arrangements: Other (Comment) Available Help at Discharge: Family;Available 24 hours/day Type of Home: House Home Access: Ramped entrance     Home Layout: One level     Bathroom Shower/Tub: Chief Strategy Officer: Standard Bathroom Accessibility: Yes How Accessible: Accessible via walker Home Equipment: Shower seat;Hand held Stage manager (4 wheels);BSC/3in1   Additional Comments: lives with grandson 76yo, daughter drives school bus and checks on pt daily      Prior Functioning/Environment Prior Level of Function : Independent/Modified Independent             Mobility Comments: daughter reports pt using rollator in the home, active with HHPT, able to get into/out of bed ind ADLs Comments: daughter reports pt ind with toileting, daughter providing supv with showering on shower seat; daughter completes household chores    OT Problem List: Decreased strength;Decreased activity tolerance;Impaired balance (sitting and/or standing);Decreased coordination;Decreased cognition;Decreased safety awareness;Decreased knowledge of use of DME or AE;Decreased knowledge of precautions;Impaired UE functional use   OT Treatment/Interventions:       AM-PAC OT "6 Clicks" Daily Activity     Outcome Measure Help from another person eating meals?: A Lot Help from another person taking care of personal grooming?: A Lot Help from another person toileting, which includes using toliet, bedpan, or urinal?: Total Help from another person bathing (including washing, rinsing, drying)?: Total Help from another person to put on and taking off regular upper body clothing?: A Lot Help from another person to put on and taking off regular lower body clothing?: Total 6 Click Score: 9   End of Session  Equipment Utilized During  Treatment: Gait belt Nurse Communication: Mobility status  Activity Tolerance: Patient limited by fatigue;Patient limited by lethargy Patient left: in bed;with call bell/phone within reach;with bed alarm set;with family/visitor present  OT Visit Diagnosis: Muscle weakness (generalized) (M62.81)                Time: 1308-6578 OT Time Calculation (min): 30 min Charges:  OT General Charges $OT Visit: 1 Visit OT Evaluation $OT Eval Low Complexity: 1 Low OT Treatments $Self Care/Home Management : 8-22 mins Amyrie Illingworth OT/L Acute Rehabilitation Department  [phone]  11/15/2023, 4:07 PM

## 2023-11-15 NOTE — TOC Progression Note (Addendum)
 Transition of Care [facility]) - Progression Note    Patient Details  Name: Heather Rogers MRN: [id number] Date of Birth: 11-02-1945  [facility] CM/SW Contact  Loreda Rodriguez, RN Phone Number:[phone]  11/15/2023, 10:13 AM  Clinical Narrative:    MD updated CM , daughter is at bedside and family has made decision for residential hospice and [facility]. Referral has been sent to [facility] with [facility].   1628 Cm received update from [facility] stating that patient doesn't meet IPU criteria at this time, we are working towards home with hospice, she requested DME on Monday. TOC will follow.    Expected Discharge Plan: Home w Home Health Services Barriers to Discharge: Continued Medical Work up  Expected Discharge Plan and Services In-house Referral: NA Discharge Planning Services: CM Consult Post Acute Care Choice: NA Living arrangements for the past 2 months: Single Family Home                 DME Arranged: N/A DME Agency: NA       HH Arranged: NA HH Agency: NA         Social Determinants of Health (SDOH) Interventions SDOH Screenings   Food Insecurity: No Food Insecurity (11/07/2023)  Housing: Low Risk  (11/07/2023)  Transportation Needs: No Transportation Needs (11/07/2023)  Utilities: Not At Risk (11/07/2023)  Alcohol Screen: Low Risk  (05/22/2023)  Depression (PHQ2-9): Low Risk  (10/23/2023)  Financial Resource Strain: Low Risk  (08/22/2023)  Physical Activity: Inactive (08/22/2023)  Social Connections: Unknown (11/07/2023)  Stress: No Stress Concern Present (08/22/2023)  Tobacco Use: Medium Risk (11/07/2023)  Health Literacy: Adequate Health Literacy (05/22/2023)    Readmission Risk Interventions    11/08/2023    3:30 PM  Readmission Risk Prevention Plan  Transportation Screening Complete  PCP or Specialist Appt within 3-5 Days Complete  HRI or Home Care Consult Complete  Social Work Consult for Recovery Care Planning/Counseling Complete   Medication Review Oceanographer) Referral to Pharmacy

## 2023-11-15 NOTE — Progress Notes (Signed)
 Daily Progress Note   Patient Name: Heather Rogers       Date: 11/15/2023 DOB: 09/04/1945  Age: 78 y.o. MRN#: [id number] Attending Physician: Audria Leather, MD Primary Care Physician: Joaquin Mulberry, MD Admit Date: 11/07/2023  Reason for Consultation/Follow-up: Establishing goals of care  Subjective:  Awake alert, no distress, lucid, resting comfortably, daughter Heather Rogers at bedside. Patient did get relief from paracentesis done on 11-14-23.   Length of Stay: 8  Current Medications: Scheduled Meds:   amLODipine   10 mg Oral Daily   aspirin  EC  81 mg Oral Daily   calcitonin  4 Units/kg Intramuscular BID   carvedilol   25 mg Oral BID WC   Chlorhexidine  Gluconate Cloth  6 each Topical Daily   enoxaparin  (LOVENOX ) injection  40 mg Subcutaneous Q24H   feeding supplement  237 mL Oral BID BM   ferrous sulfate   325 mg Oral Q breakfast   insulin  aspart  0-15 Units Subcutaneous TID WC   insulin  aspart  0-5 Units Subcutaneous QHS   multivitamin with minerals  1 tablet Oral Daily   mouth rinse  15 mL Mouth Rinse 4 times per day    Continuous Infusions:  ampicillin -sulbactam (UNASYN ) IV Stopped (11/15/23 0231)    PRN Meds: acetaminophen  **OR** acetaminophen , albuterol , ondansetron  **OR** ondansetron  (ZOFRAN ) IV, mouth rinse, oxyCODONE , traZODone   Physical Exam         Weak appearing lady No distress Regular work of breathing Diminished breath sounds  Vital Signs: BP (!) 150/64 (BP Location: Right Arm)   Pulse 66   Temp (!) 97.4 F (36.3 C) (Oral)   Resp 16   Ht 5\' 2"  (1.575 m)   Wt 71 kg   SpO2 99%   BMI 28.63 kg/m  SpO2: SpO2: 99 % O2 Device: O2 Device: Room Air O2 Flow Rate:    Intake/output summary:  Intake/Output Summary (Rogers 24 hours) at 11/15/2023 1006 Rogers data  filed at 11/15/2023 0550 Gross per 24 hour  Intake 2319.17 ml  Output 700 ml  Net 1619.17 ml   LBM: Rogers BM Date : 11/14/23 Baseline Weight: Weight: 70.8 kg Most recent weight: Weight: 71 kg       Palliative Assessment/Data:      Patient Active Problem List   Diagnosis Date Noted   Generalized weakness 10/05/2023   Protein-calorie  malnutrition, moderate (HCC) 10/05/2023   Uncontrolled type 2 diabetes mellitus with hyperglycemia, with long-term current use of insulin  (HCC) 10/05/2023   Breast cancer metastasized to liver, left (HCC) 10/01/2023   C. difficile colitis 10/01/2023   Acute metabolic encephalopathy 09/28/2023   Elevated LFTs 09/28/2023   Malignant ascites 09/28/2023   Acute on chronic anemia 09/28/2023   Chronic kidney disease, stage 3b (HCC) 02/18/2022   Hyperlipidemia associated with type 2 diabetes mellitus (HCC) 02/18/2022   Primary malignant neoplasm of breast with metastasis (HCC) 09/14/2021   Hypertrophic cardiomyopathy (HCC) 09/04/2021   Breast mass, left 09/04/2021   Cognitive decline 09/04/2021   Positive D dimer 09/03/2021   Pleural effusion 09/03/2021   Pleural effusion on left 09/02/2021   Acute renal failure superimposed on stage 3b chronic kidney disease (HCC) 09/02/2021   Atherosclerotic cerebrovascular disease 05/23/2021   Anemia of chronic disease 05/23/2021   Gout 02/14/2021   Syncope 07/25/2020   Type 2 diabetes mellitus (A1c 6.4 on 2/5)  07/25/2020   Status post CVA 07/25/2020   Dyslipidemia 07/25/2020   Hypertension associated with diabetes (HCC) 07/25/2020   Coagulopathy (HCC) 07/25/2020   History of CVA 11/22/2018    Palliative Care Assessment & Plan   Patient Profile:    Assessment:  78 year old lady who is under the care of Dr. Gudena for metastatic breast cancer with recurrent ascites.  More recently the patient has been having nausea, emesis, ongoing functional decline, declining nutritional status, possible aspiration  pneumonia and recurrent fluid buildup in the abdomen necessitating recurrent paracenteses attempts. Patient lives at home with grandson, daughter Heather Rogers is primary caregiver who takes care of of the patient. Palliative consult for ongoing assistance with goals of care discussions and CODE STATUS discussions has been requested.  Recommendations/Plan: Code status and goals of care discussions undertaken with the patient, with her daughter Heather Rogers also present - we talked about differences between full code versus DNR DNI. We reviewed about the full scope of a resuscitative attempt and that it doesn't offer any benefit at this point, and that it could lead to more harm discomfort and suffering at end of life. Gave the patient time and space to reflect, she looks over at her daughter repeatedly and states that she has handed over decision making to her daughter Heather Rogers.  At this point, both patient and daughter are in favor of establishing DNR DNI.   We then talked about hospice care - residential hospice - [facility]. Patient lives close to [facility] and we talked about home with hospice versus residential hospice.  At this time, both patient and daughter are in favor of residential hospice. I have discussed with [facility] colleague Heather Rogers.   Continue antibiotics and current mode of care, until there is a hospice bed available. I have discussed with patient and daughter about no IVF no Abx and no labs at residential hospice and they are in agreement to proceed with [facility].   Goals of Care and Additional Recommendations: Limitations on Scope of Treatment: Full Comfort Care  Code Status:    Code Status Orders  (From admission, onward)           Start     Ordered   11/15/23 1006  Do not attempt resuscitation (DNR) - Comfort care  (Code Status)  Continuous       Question Answer Comment  If patient has no pulse and is not breathing Do Not Attempt Resuscitation   In Pre-Arrest Conditions  (Patient  Is Breathing and Has a Pulse) Provide comfort measures. Relieve any mechanical airway obstruction. Avoid transfer unless required for comfort.   Consent: Discussion documented in EHR or advanced directives reviewed      11/15/23 1005           Code Status History     Date Active Date Inactive Code Status Order ID Comments User Context   11/07/2023 1456 11/15/2023 1005 Full Code [id number]  Gaylin Ke, MD ED   10/01/2023 1320 10/06/2023 2212 Full Code [id number]  [place], Eric J, DO Inpatient   09/28/2023 0313 09/30/2023 1719 Do not attempt resuscitation (DNR) PRE-ARREST INTERVENTIONS DESIRED [id number]  Juliette Oh, MD ED   02/18/2022 2003 03/01/2022 1904 Full Code [id number]  Kenny Peals, MD ED   09/02/2021 2259 09/08/2021 1703 Full Code [id number]  Unk Garb, DO Inpatient   09/02/2021 2057 09/02/2021 2259 Full Code [id number]  Unk Garb, DO ED   07/25/2020 0750 07/27/2020 2101 Full Code [id number]  Lena Qualia, MD ED   05/15/2018 1206 05/16/2018 2203 Full Code [id number]  Luna Salinas, MD ED       Prognosis:  < 2 weeks  Discharge Planning: Hospice facility  Care plan was discussed with  patient, daughter Heather Rogers, TOC TRH   Thank you for allowing the Palliative Medicine Team to assist in the care of this patient.  High MDM     Greater than 50%  of this time was spent counseling and coordinating care related to the above assessment and plan.  Lujean Sake, MD  Please contact Palliative Medicine Team phone at [phone] for questions and concerns.

## 2023-11-15 NOTE — Progress Notes (Signed)
 PROGRESS NOTE    Heather Rogers  WUJ:[id number] DOB: 1946-07-15 DOA: 11/07/2023 PCP: Joaquin Mulberry, MD   Brief Narrative:  78 y.o. female with medical history significant for hypertension, metastatic breast cancer, diabetes, CKD stage IIIb and recently hospitalized in March 2025 with C. difficile colitis presented to the hospital with worsening symptomatic abdominal ascites despite taking diuretics at home.  Workup in the ED showed moderate abdominal ascites.  She was admitted for the same.  She underwent ultrasound-guided paracentesis and removal of 1.3 L of yellow fluid on 11/08/2023 by IR.  Oncology following.  Hospitalization complicated by possible aspiration pneumonia requiring IV Unasyn  along with altered mental status.  Palliative care consulted for goals of care discussion.  Assessment & Plan:   Abdominal ascites Metastatic breast cancer Goals of care Failure to thrive/poor oral intake -Presumably malignant -underwent ultrasound-guided paracentesis and removal of 1.3 L of yellow fluid on 11/08/2023 by IR.  Cytology negative for malignant cells.  Initially started on Rocephin  for SBP prophylaxis but ascitic fluid was negative for SBP -Oral intake remains poor.  Dietitian consulted. - Underwent ultrasound-guided paracentesis again on 11/14/2023 with removal of 1.4 L fluid. - Oncology follow-up appreciated: Patient will not be able to tolerate systemic treatment/chemotherapy and oncology is recommending hospice care.  Daughter is contemplating the same.  Palliative care following.  Patient currently remains full code.  Overall prognosis is poor.  Hypercalcemia - Possibly from malignancy.  Patient received a dose of IV Zometa  on 11/13/2023.  Also getting calcitonin along with IV fluids.  Corrected calcium  remains elevated at 13.58 today.  Monitor.  Acute kidney injury -Likely prerenal.  Treated with IV fluids.  Resolved.  Probable aspiration pneumonia/pneumonitis -Continue Unasyn   for total of 7 days.  Diet as per SLP recommendations.  Acute metabolic encephalopathy/altered mental status - Possibly from all of the above along with hypercalcemia. - CT head was nonacute. - Still slow to respond and very sleepy.  Monitor mental status.  Fall precautions.  Minimize sedating medications.  Gabapentin  held.  Iron deficiency anemia -hemoglobin stable.  Monitor intermittently.  Continue oral supplementation  Hypertension - Continue amlodipine  and Coreg   Diabetes mellitus type 2 - Continue CBGs with SSI.  Blood sugars well-controlled  Physical deconditioning -PT recommends home health PT.  DVT prophylaxis: Lovenox  Code Status: Full Family Communication: Daughter at bedside on 11/13/2023. Disposition Plan: Status is: Inpatient Remains inpatient appropriate because: Of severity of illness  Consultants: Oncology.  palliative care  Procedures: As above  Antimicrobials:  Anti-infectives (From admission, onward)    Start     Dose/Rate Route Frequency Ordered Stop   11/10/23 1130  Ampicillin -Sulbactam (UNASYN ) 3 g in sodium chloride  0.9 % 100 mL IVPB        3 g 200 mL/hr over 30 Minutes Intravenous Every 8 hours 11/10/23 1034     11/08/23 1400  cefTRIAXone  (ROCEPHIN ) 2 g in sodium chloride  0.9 % 100 mL IVPB  Status:  Discontinued        2 g 200 mL/hr over 30 Minutes Intravenous Every 24 hours 11/08/23 1258 11/10/23 1030        Subjective: Patient seen and examined at bedside.  Extremely poor historian.  No agitation, seizures or vomiting reported.  Having intermittent fevers.   Objective: Vitals:   11/14/23 1737 11/14/23 1832 11/14/23 1957 11/15/23 0523  BP:   (!) 136/58 (!) 150/64  Pulse:   75 66  Resp:   16 16  Temp: (!) 100.8 F (38.2 C) (!)  100.8 F (38.2 C) 98.5 F (36.9 C) (!) 97.4 F (36.3 C)  TempSrc: Oral  Oral Oral  SpO2:   94% 99%  Weight:      Height:        Intake/Output Summary (Last 24 hours) at 11/15/2023 0811 Last data filed at  11/15/2023 0550 Gross per 24 hour  Intake 2319.17 ml  Output 700 ml  Net 1619.17 ml   Filed Weights   11/07/23 0956 11/07/23 1704  Weight: 70.8 kg 71 kg    Examination:  General: No acute distress.  Currently on room air.  Chronically ill and deconditioned looking. ENT/neck: No JVD elevation or palpable neck masses noted respiratory: Bilateral decreased breath sounds at bases with scattered crackles  CVS: Rate mostly controlled; S1-S2 heard  Abdominal: Soft, nontender, distended mildly; no organomegaly, bowel sounds are heard normally Extremities: No clubbing; mild lower extremity edema present CNS: Still extremely slow to respond and a very poor historian.  No focal neurologic deficit.  Able to move extremities Lymph: No palpable lymphadenopathy Skin: No obvious petechiae/rashes psych: Extremely flat affect.  Not agitated currently. musculoskeletal: No obvious joint tenderness/erythema    Data Reviewed: I have personally reviewed following labs and imaging studies  CBC: Recent Labs  Lab 11/11/23 0604 11/12/23 0538 11/13/23 0541 11/14/23 1037 11/15/23 0548  WBC 19.4* 17.1* 11.9* 10.9* 12.4*  NEUTROABS  --   --   --  7.4 8.8*  HGB 10.5* 8.9* 7.6* 8.6* 8.1*  HCT 34.5* 27.3* 23.5* 27.6* 27.0*  MCV 84.6 78.7* 79.1* 81.4 82.8  PLT 194 215 209 229 226   Basic Metabolic Panel: Recent Labs  Lab 11/09/23 1319 11/10/23 0809 11/11/23 0604 11/12/23 0538 11/13/23 0541 11/14/23 0653 11/15/23 0548  NA 136   < > 143 146* 148* 145 147*  K 4.9   < > 4.5 4.3 3.6 4.8 3.9  CL 106   < > 114* 115* 123* 119* 119*  CO2 19*   < > 20* 22 22 17* 22  GLUCOSE 122*   < > 105* 79 71 142* 164*  BUN 36*   < > 43* 51* 41* 33* 29*  CREATININE 1.33*   < > 1.37* 1.32* 0.98 1.04* 1.03*  CALCIUM  11.1*   < > 11.9* 12.5* 11.4* 12.4* 11.9*  MG 2.4  --  2.8* 2.9*  --  3.1* 2.2  PHOS 2.4*  --  3.2 3.2 2.1*  --   --    < > = values in this interval not displayed.   GFR: Estimated Creatinine  Clearance: 41.6 mL/min (A) (by C-G formula based on SCr of 1.03 mg/dL (H)). Liver Function Tests: Recent Labs  Lab 11/09/23 1319 11/10/23 0809 11/12/23 0538 11/13/23 0541 11/15/23 0548  AST 389* 351* 155* 113* 211*  ALT 123* 130* 83* 61* 90*  ALKPHOS 220* 227* 207* 208* 240*  BILITOT 1.4* 1.6* 1.1 0.9 1.2  PROT 6.3* 5.7* 6.2* 5.4* 5.7*  ALBUMIN 2.3* 2.1* 2.1* 1.8* 1.9*   No results for input(s): "LIPASE", "AMYLASE" in the last 168 hours. Recent Labs  Lab 11/08/23 2143  AMMONIA 26   Coagulation Profile: No results for input(s): "INR", "PROTIME" in the last 168 hours. Cardiac Enzymes: No results for input(s): "CKTOTAL", "CKMB", "CKMBINDEX", "TROPONINI" in the last 168 hours. BNP (last 3 results) No results for input(s): "PROBNP" in the last 8760 hours. HbA1C: No results for input(s): "HGBA1C" in the last 72 hours. CBG: Recent Labs  Lab 11/13/23 2202 11/14/23 0739 11/14/23 1124 11/14/23  1635 11/14/23 2156  GLUCAP 148* 146* 139* 157* 154*   Lipid Profile: No results for input(s): "CHOL", "HDL", "LDLCALC", "TRIG", "CHOLHDL", "LDLDIRECT" in the last 72 hours. Thyroid  Function Tests: No results for input(s): "TSH", "T4TOTAL", "FREET4", "T3FREE", "THYROIDAB" in the last 72 hours. Anemia Panel: No results for input(s): "VITAMINB12", "FOLATE", "FERRITIN", "TIBC", "IRON", "RETICCTPCT" in the last 72 hours. Sepsis Labs: Recent Labs  Lab 11/08/23 2143  LATICACIDVEN 1.2    Recent Results (from the past 240 hours)  Body fluid culture w Gram Stain     Status: None   Collection Time: 11/08/23  2:52 PM   Specimen: PATH Cytology Pleural fluid  Result Value Ref Range Status   Specimen Description   Final    PLEURAL Performed at [facility], [address] [place], [place] [address]    Special Requests   Final    NONE Performed at [facility], [address] [place], [place] [address]    Gram Stain   Final    RARE WBC  PRESENT,BOTH PMN AND MONONUCLEAR NO ORGANISMS SEEN    Culture   Final    NO GROWTH 3 DAYS Performed at [facility], [address] [place], [place] [address]    Report Status 11/11/2023 FINAL  Final  Culture, blood (Routine X 2) w Reflex to ID Panel     Status: None   Collection Time: 11/08/23  9:43 PM   Specimen: BLOOD LEFT HAND  Result Value Ref Range Status   Specimen Description   Final    BLOOD LEFT HAND Performed at [facility], [address] [place], [place] [address]    Special Requests   Final    BOTTLES DRAWN AEROBIC AND ANAEROBIC Blood Culture results may not be optimal due to an inadequate volume of blood received in culture bottles Performed at [facility], [address] [place], [place] [address]    Culture   Final    NO GROWTH 5 DAYS Performed at [facility], [address], [place], [place] [address]    Report Status 11/13/2023 FINAL  Final  Culture, blood (Routine X 2) w Reflex to ID Panel     Status: None   Collection Time: 11/08/23  9:43 PM   Specimen: BLOOD LEFT HAND  Result Value Ref Range Status   Specimen Description   Final    BLOOD LEFT HAND Performed at [facility], [address] [place], [place] [address]    Special Requests   Final    BOTTLES DRAWN AEROBIC AND ANAEROBIC Blood Culture results may not be optimal due to an inadequate volume of blood received in culture bottles Performed at [facility], [address] [place], [place] [address]    Culture   Final    NO GROWTH 5 DAYS Performed at [facility], [address] [place], [place] [address]    Report Status 11/13/2023 FINAL  Final  Culture, blood (Routine X 2) w Reflex to ID Panel     Status: None (Preliminary result)   Collection Time: 11/10/23 11:19 AM   Specimen: BLOOD LEFT ARM  Result Value Ref Range Status   Specimen Description   Final    BLOOD LEFT ARM BLOOD LEFT HAND Performed at [facility], [address], [place], [place] [address]    Special Requests   Final    BOTTLES DRAWN AEROBIC ONLY Blood  Culture results may not be optimal due to an inadequate volume of blood received in culture bottles Performed at [facility], [address], [place], [place] [address]    Culture   Final    NO GROWTH 4 DAYS Performed at [facility], [address], [place], [place] [address]    Report Status PENDING  Incomplete  Culture, blood (Routine X 2) w Reflex to ID Panel     Status: None (Preliminary result)   Collection Time: 11/10/23 11:19 AM   Specimen: BLOOD  Result Value Ref Range Status   Specimen Description   Final    BLOOD BLOOD LEFT HAND Performed at [facility], [address] [place], [place] [address]    Special Requests   Final    BOTTLES DRAWN AEROBIC ONLY Blood Culture results may not be optimal due to an inadequate volume of blood received in culture bottles Performed at [facility], [address] [place], [place] [address]    Culture   Final    NO GROWTH 4 DAYS Performed at [facility], [address], [place], [place] [address]    Report Status PENDING  Incomplete         Radiology Studies: US  Paracentesis Result Date: 11/14/2023 INDICATION: Patient with history of primary breast cancer with metastasis, recurrent malignant ascites. IR consulted for therapeutic paracentesis. EXAM: ULTRASOUND GUIDED THERAPEUTIC PARACENTESIS MEDICATIONS: 10 mL 1% lidocaine  COMPLICATIONS: None immediate. PROCEDURE: Informed written consent was obtained from the patient's daughter after a discussion of the risks, benefits and alternatives to treatment. A timeout was performed prior to the initiation of the procedure. Initial ultrasound scanning demonstrates a moderate amount of ascites within the right lower abdominal quadrant. The right lower abdomen was prepped and draped in the usual  sterile fashion. 1% lidocaine  was used for local anesthesia. Following this, a 19 gauge, 10-cm, Yueh catheter was introduced. An ultrasound image was saved for documentation purposes. The paracentesis was performed. The catheter was removed and a dressing was applied. The patient tolerated the procedure well without immediate post procedural complication. FINDINGS: A total of approximately 1.4 liters of yellow fluid was removed. IMPRESSION: Successful ultrasound-guided paracentesis yielding 1.4 liters of peritoneal fluid. Performed by: Wyatt Pommier, PA-C Electronically Signed   By: Nicoletta Barrier M.D.   On: 11/14/2023 17:30        Scheduled Meds:  amLODipine   10 mg Oral Daily   aspirin  EC  81 mg Oral Daily   calcitonin  4 Units/kg Intramuscular BID   carvedilol   25 mg Oral BID WC   Chlorhexidine  Gluconate Cloth  6 each Topical Daily   enoxaparin  (LOVENOX ) injection  40 mg Subcutaneous Q24H   feeding supplement  237 mL Oral BID BM   ferrous sulfate   325 mg Oral Q breakfast   insulin  aspart  0-15 Units Subcutaneous TID WC   insulin  aspart  0-5 Units Subcutaneous QHS   multivitamin with minerals  1 tablet Oral Daily   mouth rinse  15 mL Mouth Rinse 4 times per day   Continuous Infusions:  ampicillin -sulbactam (UNASYN ) IV Stopped (11/15/23 0231)   dextrose  5 % and 0.45 % NaCl 50 mL/hr at 11/15/23 0300          Audria Leather, MD [facility] 11/15/2023, 8:11 AM

## 2023-11-16 DIAGNOSIS — C50911 Malignant neoplasm of unspecified site of right female breast: Secondary | ICD-10-CM | POA: Diagnosis not present

## 2023-11-16 DIAGNOSIS — R180 Malignant ascites: Secondary | ICD-10-CM | POA: Diagnosis not present

## 2023-11-16 LAB — COMPREHENSIVE METABOLIC PANEL WITH GFR
ALT: 105 U/L — ABNORMAL HIGH (ref 0–44)
AST: 273 U/L — ABNORMAL HIGH (ref 15–41)
Albumin: 2 g/dL — ABNORMAL LOW (ref 3.5–5.0)
Alkaline Phosphatase: 278 U/L — ABNORMAL HIGH (ref 38–126)
Anion gap: 6 (ref 5–15)
BUN: 29 mg/dL — ABNORMAL HIGH (ref 8–23)
CO2: 23 mmol/L (ref 22–32)
Calcium: 11.6 mg/dL — ABNORMAL HIGH (ref 8.9–10.3)
Chloride: 120 mmol/L — ABNORMAL HIGH (ref 98–111)
Creatinine, Ser: 1.09 mg/dL — ABNORMAL HIGH (ref 0.44–1.00)
GFR, Estimated: 52 mL/min — ABNORMAL LOW (ref >60)
Glucose, Bld: 172 mg/dL — ABNORMAL HIGH (ref 70–99)
Potassium: 3.8 mmol/L (ref 3.5–5.1)
Sodium: 149 mmol/L — ABNORMAL HIGH (ref 135–145)
Total Bilirubin: 1.1 mg/dL (ref 0.0–1.2)
Total Protein: 5.9 g/dL — ABNORMAL LOW (ref 6.5–8.1)

## 2023-11-16 LAB — CBC WITH DIFFERENTIAL/PLATELET
Abs Immature Granulocytes: 0.43 10*3/uL — ABNORMAL HIGH (ref 0.00–0.07)
Basophils Absolute: 0.1 10*3/uL (ref 0.0–0.1)
Basophils Relative: 0 %
Eosinophils Absolute: 0 10*3/uL (ref 0.0–0.5)
Eosinophils Relative: 0 %
HCT: 25.7 % — ABNORMAL LOW (ref 36.0–46.0)
Hemoglobin: 8.2 g/dL — ABNORMAL LOW (ref 12.0–15.0)
Immature Granulocytes: 3 %
Lymphocytes Relative: 11 %
Lymphs Abs: 1.7 10*3/uL (ref 0.7–4.0)
MCH: 25 pg — ABNORMAL LOW (ref 26.0–34.0)
MCHC: 31.9 g/dL (ref 30.0–36.0)
MCV: 78.4 fL — ABNORMAL LOW (ref 80.0–100.0)
Monocytes Absolute: 2.4 10*3/uL — ABNORMAL HIGH (ref 0.1–1.0)
Monocytes Relative: 15 %
Neutro Abs: 11.7 10*3/uL — ABNORMAL HIGH (ref 1.7–7.7)
Neutrophils Relative %: 71 %
Platelets: 223 10*3/uL (ref 150–400)
RBC: 3.28 MIL/uL — ABNORMAL LOW (ref 3.87–5.11)
RDW: 18.9 % — ABNORMAL HIGH (ref 11.5–15.5)
WBC: 16.4 10*3/uL — ABNORMAL HIGH (ref 4.0–10.5)
nRBC: 1.2 % — ABNORMAL HIGH (ref 0.0–0.2)

## 2023-11-16 LAB — MAGNESIUM: Magnesium: 2.3 mg/dL (ref 1.7–2.4)

## 2023-11-16 LAB — GLUCOSE, CAPILLARY
Glucose-Capillary: 164 mg/dL — ABNORMAL HIGH (ref 70–99)
Glucose-Capillary: 220 mg/dL — ABNORMAL HIGH (ref 70–99)
Glucose-Capillary: 160 mg/dL — ABNORMAL HIGH (ref 70–99)

## 2023-11-16 NOTE — Progress Notes (Signed)
 PROGRESS NOTE    Heather Rogers  ZOX:[id number] DOB: Dec 20, 1945 DOA: 11/07/2023 PCP: Joaquin Mulberry, MD   Brief Narrative:  78 y.o. female with medical history significant for hypertension, metastatic breast cancer, diabetes, CKD stage IIIb and recently hospitalized in March 2025 with C. difficile colitis presented to the hospital with worsening symptomatic abdominal ascites despite taking diuretics at home.  Workup in the ED showed moderate abdominal ascites.  She was admitted for the same.  She underwent ultrasound-guided paracentesis and removal of 1.3 L of yellow fluid on 11/08/2023 by IR.  Oncology following.  Hospitalization complicated by possible aspiration pneumonia requiring IV Unasyn  along with altered mental status.  Palliative care consulted for goals of care discussion.  Subsequently, family decided to pursue residential hospice on 11/15/2023.  Assessment & Plan:   Abdominal ascites Metastatic breast cancer Goals of care Failure to thrive/poor oral intake Hypercalcemia Acute kidney injury Probable aspiration pneumonia/pneumonitis Acute metabolic encephalopathy/altered mental status Iron deficiency anemia Hypertension Diabetes mellitus type 2 Physical deconditioning  Plan - Because of worsening overall general condition and recommendations of hospice from oncology, palliative care was consulted. Subsequently, family decided to pursue residential hospice on 11/15/2023.  Continue current treatment including antibiotics till residential hospice placement.  DVT prophylaxis: Lovenox  Code Status: DNR Family Communication: Daughter at bedside on 11/13/2023. Disposition Plan: Status is: Inpatient Remains inpatient appropriate because: Of severity of illness  Consultants: Oncology.  palliative care  Procedures: As above  Antimicrobials:  Anti-infectives (From admission, onward)    Start     Dose/Rate Route Frequency Ordered Stop   11/10/23 1130  Ampicillin -Sulbactam  (UNASYN ) 3 g in sodium chloride  0.9 % 100 mL IVPB        3 g 200 mL/hr over 30 Minutes Intravenous Every 8 hours 11/10/23 1034     11/08/23 1400  cefTRIAXone  (ROCEPHIN ) 2 g in sodium chloride  0.9 % 100 mL IVPB  Status:  Discontinued        2 g 200 mL/hr over 30 Minutes Intravenous Every 24 hours 11/08/23 1258 11/10/23 1030        Subjective: Patient seen and examined at bedside.  Poor historian.  No seizures, agitation or vomiting reported.   Objective: Vitals:   11/14/23 1957 11/15/23 0523 11/15/23 1333 11/16/23 0503  BP: (!) 136/58 (!) 150/64 (!) 137/58 (!) 153/51  Pulse: 75 66 65 83  Resp: 16 16 18 16   Temp: 98.5 F (36.9 C) (!) 97.4 F (36.3 C) (!) 97.5 F (36.4 C) 100.2 F (37.9 C)  TempSrc: Oral Oral Oral Oral  SpO2: 94% 99% 96% 94%  Weight:      Height:        Intake/Output Summary (Last 24 hours) at 11/16/2023 0825 Last data filed at 11/16/2023 0500 Gross per 24 hour  Intake 640 ml  Output 1000 ml  Net -360 ml   Filed Weights   11/07/23 0956 11/07/23 1704  Weight: 70.8 kg 71 kg    Examination:  General: Looks chronically ill and deconditioned.  No distress.  Data Reviewed: I have personally reviewed following labs and imaging studies  CBC: Recent Labs  Lab 11/11/23 0604 11/12/23 0538 11/13/23 0541 11/14/23 1037 11/15/23 0548  WBC 19.4* 17.1* 11.9* 10.9* 12.4*  NEUTROABS  --   --   --  7.4 8.8*  HGB 10.5* 8.9* 7.6* 8.6* 8.1*  HCT 34.5* 27.3* 23.5* 27.6* 27.0*  MCV 84.6 78.7* 79.1* 81.4 82.8  PLT 194 215 209 229 226   Basic Metabolic Panel:  Recent Labs  Lab 11/09/23 1319 11/10/23 0809 11/11/23 0604 11/12/23 0538 11/13/23 0541 11/14/23 0653 11/15/23 0548  NA 136   < > 143 146* 148* 145 147*  K 4.9   < > 4.5 4.3 3.6 4.8 3.9  CL 106   < > 114* 115* 123* 119* 119*  CO2 19*   < > 20* 22 22 17* 22  GLUCOSE 122*   < > 105* 79 71 142* 164*  BUN 36*   < > 43* 51* 41* 33* 29*  CREATININE 1.33*   < > 1.37* 1.32* 0.98 1.04* 1.03*  CALCIUM  11.1*    < > 11.9* 12.5* 11.4* 12.4* 11.9*  MG 2.4  --  2.8* 2.9*  --  3.1* 2.2  PHOS 2.4*  --  3.2 3.2 2.1*  --   --    < > = values in this interval not displayed.   GFR: Estimated Creatinine Clearance: 41.6 mL/min (A) (by C-G formula based on SCr of 1.03 mg/dL (H)). Liver Function Tests: Recent Labs  Lab 11/09/23 1319 11/10/23 0809 11/12/23 0538 11/13/23 0541 11/15/23 0548  AST 389* 351* 155* 113* 211*  ALT 123* 130* 83* 61* 90*  ALKPHOS 220* 227* 207* 208* 240*  BILITOT 1.4* 1.6* 1.1 0.9 1.2  PROT 6.3* 5.7* 6.2* 5.4* 5.7*  ALBUMIN 2.3* 2.1* 2.1* 1.8* 1.9*   No results for input(s): "LIPASE", "AMYLASE" in the last 168 hours. No results for input(s): "AMMONIA" in the last 168 hours.  Coagulation Profile: No results for input(s): "INR", "PROTIME" in the last 168 hours. Cardiac Enzymes: No results for input(s): "CKTOTAL", "CKMB", "CKMBINDEX", "TROPONINI" in the last 168 hours. BNP (last 3 results) No results for input(s): "PROBNP" in the last 8760 hours. HbA1C: No results for input(s): "HGBA1C" in the last 72 hours. CBG: Recent Labs  Lab 11/15/23 0748 11/15/23 0847 11/15/23 1149 11/15/23 1610 11/16/23 0721  GLUCAP 153* 142* 152* 173* 164*   Lipid Profile: No results for input(s): "CHOL", "HDL", "LDLCALC", "TRIG", "CHOLHDL", "LDLDIRECT" in the last 72 hours. Thyroid  Function Tests: No results for input(s): "TSH", "T4TOTAL", "FREET4", "T3FREE", "THYROIDAB" in the last 72 hours. Anemia Panel: No results for input(s): "VITAMINB12", "FOLATE", "FERRITIN", "TIBC", "IRON", "RETICCTPCT" in the last 72 hours. Sepsis Labs: No results for input(s): "PROCALCITON", "LATICACIDVEN" in the last 168 hours.   Recent Results (from the past 240 hours)  Body fluid culture w Gram Stain     Status: None   Collection Time: 11/08/23  2:52 PM   Specimen: PATH Cytology Pleural fluid  Result Value Ref Range Status   Specimen Description   Final    PLEURAL Performed at [facility], [address], [place], [place] [address]    Special Requests   Final    NONE Performed at [facility], [address], [place], [place] [address]    Gram Stain   Final    RARE WBC PRESENT,BOTH PMN AND MONONUCLEAR NO ORGANISMS SEEN    Culture   Final    NO GROWTH 3 DAYS Performed at [facility], [address] [place], [place] [address]    Report Status 11/11/2023 FINAL  Final  Culture, blood (Routine X 2) w Reflex to ID Panel     Status: None   Collection Time: 11/08/23  9:43 PM   Specimen: BLOOD LEFT HAND  Result Value Ref Range Status   Specimen Description   Final    BLOOD LEFT HAND Performed at [facility], [address], [place], [place] [address]    Special Requests   Final    BOTTLES DRAWN AEROBIC AND ANAEROBIC Blood Culture results may not be optimal due to an inadequate volume of blood received in culture bottles Performed at [facility], [address], [place], [place] [address]    Culture   Final    NO GROWTH 5 DAYS Performed at [facility], [address], [place], [place] [address]    Report Status 11/13/2023 FINAL  Final  Culture, blood (Routine X 2) w Reflex to ID Panel     Status: None   Collection Time: 11/08/23  9:43 PM   Specimen: BLOOD LEFT HAND  Result Value Ref Range Status   Specimen Description   Final    BLOOD LEFT HAND Performed at [facility], [address], [place], [place] [address]    Special Requests   Final    BOTTLES DRAWN AEROBIC AND ANAEROBIC Blood Culture results may not be optimal due to an inadequate volume of blood received in culture bottles Performed at [facility], [address] [place], [place] [address]    Culture   Final    NO GROWTH 5 DAYS Performed at [facility], [address] [place], [place] [address]    Report Status 11/13/2023 FINAL  Final  Culture, blood (Routine X 2) w Reflex to ID Panel     Status:  None   Collection Time: 11/10/23 11:19 AM   Specimen: BLOOD LEFT ARM  Result Value Ref Range Status   Specimen Description   Final    BLOOD LEFT ARM BLOOD LEFT HAND Performed at [facility], [address] [place], [place] [address]    Special Requests   Final    BOTTLES DRAWN AEROBIC ONLY Blood Culture results may not be optimal due to an inadequate volume of blood received in culture bottles Performed at [facility], [address], [place], [place] [address]    Culture   Final    NO GROWTH 5 DAYS Performed at [facility], [address], [place], [place] [address]    Report Status 11/15/2023 FINAL  Final  Culture, blood (Routine X 2) w Reflex to ID Panel     Status: None   Collection Time: 11/10/23 11:19 AM   Specimen: BLOOD  Result Value Ref Range Status   Specimen Description   Final    BLOOD BLOOD LEFT HAND Performed at [facility], [address], [place], [place] [address]    Special Requests   Final    BOTTLES DRAWN AEROBIC ONLY Blood Culture results may not be optimal due to an inadequate volume of blood received in culture bottles Performed at [facility], [address] [place], [place] [address]    Culture   Final    NO GROWTH 5 DAYS Performed at [facility], [address], [place], [place] [address]    Report Status 11/15/2023 FINAL  Final         Radiology Studies: US  Paracentesis Result Date: 11/14/2023 INDICATION: Patient with history of primary breast cancer with metastasis, recurrent malignant ascites. IR consulted for therapeutic paracentesis. EXAM: ULTRASOUND GUIDED THERAPEUTIC PARACENTESIS MEDICATIONS: 10 mL 1% lidocaine  COMPLICATIONS: None immediate. PROCEDURE: Informed written consent was obtained from the patient's daughter after a discussion of the risks, benefits and alternatives to treatment. A timeout was performed  prior to the initiation of the  procedure. Initial ultrasound scanning demonstrates a moderate amount of ascites within the right lower abdominal quadrant. The right lower abdomen was prepped and draped in the usual sterile fashion. 1% lidocaine  was used for local anesthesia. Following this, a 19 gauge, 10-cm, Yueh catheter was introduced. An ultrasound image was saved for documentation purposes. The paracentesis was performed. The catheter was removed and a dressing was applied. The patient tolerated the procedure well without immediate post procedural complication. FINDINGS: A total of approximately 1.4 liters of yellow fluid was removed. IMPRESSION: Successful ultrasound-guided paracentesis yielding 1.4 liters of peritoneal fluid. Performed by: Wyatt Pommier, PA-C Electronically Signed   By: Nicoletta Barrier M.D.   On: 11/14/2023 17:30        Scheduled Meds:  amLODipine   10 mg Oral Daily   aspirin  EC  81 mg Oral Daily   carvedilol   25 mg Oral BID WC   Chlorhexidine  Gluconate Cloth  6 each Topical Daily   enoxaparin  (LOVENOX ) injection  40 mg Subcutaneous Q24H   feeding supplement  237 mL Oral BID BM   ferrous sulfate   325 mg Oral Q breakfast   insulin  aspart  0-15 Units Subcutaneous TID WC   insulin  aspart  0-5 Units Subcutaneous QHS   multivitamin with minerals  1 tablet Oral Daily   mouth rinse  15 mL Mouth Rinse 4 times per day   Continuous Infusions:  ampicillin -sulbactam (UNASYN ) IV 3 g (11/16/23 0145)          Audria Leather, MD [facility] 11/16/2023, 8:25 AM

## 2023-11-16 NOTE — Progress Notes (Signed)
 Daily Progress Note   Patient Name: Heather Rogers       Date: 11/16/2023 DOB: January 17, 1946  Age: 78 y.o. MRN#: [id number] Attending Physician: Audria Leather, MD Primary Care Physician: Joaquin Mulberry, MD Admit Date: 11/07/2023  Reason for Consultation/Follow-up: Establishing goals of care  Subjective: Patient is less awake, less alert.  Minimal oral intake on 11-15-2023, nil oral intake since early this morning. Appears to be wincing grimacing at times.  Daughter Heather Rogers at bedside.   Length of Stay: 9  Current Medications: Scheduled Meds:   amLODipine   10 mg Oral Daily   aspirin  EC  81 mg Oral Daily   carvedilol   25 mg Oral BID WC   Chlorhexidine  Gluconate Cloth  6 each Topical Daily   enoxaparin  (LOVENOX ) injection  40 mg Subcutaneous Q24H   feeding supplement  237 mL Oral BID BM   ferrous sulfate   325 mg Oral Q breakfast   insulin  aspart  0-15 Units Subcutaneous TID WC   insulin  aspart  0-5 Units Subcutaneous QHS   multivitamin with minerals  1 tablet Oral Daily   mouth rinse  15 mL Mouth Rinse 4 times per day    Continuous Infusions:  ampicillin -sulbactam (UNASYN ) IV 3 g (11/16/23 1025)    PRN Meds: acetaminophen  **OR** acetaminophen , albuterol , ondansetron  **OR** ondansetron  (ZOFRAN ) IV, mouth rinse, oxyCODONE , traZODone   Physical Exam         Weak appearing lady mild distress Regular work of breathing Diminished breath sounds Mild abdominal distention Vital Signs: BP (!) 153/51 (BP Location: Right Arm)   Pulse 83   Temp 100.2 F (37.9 C) (Oral)   Resp 16   Ht 5\' 2"  (1.575 m)   Wt 71 kg   SpO2 94%   BMI 28.63 kg/m  SpO2: SpO2: 94 % O2 Device: O2 Device: Room Air O2 Flow Rate:    Intake/output summary:  Intake/Output Summary (Last 24 hours) at 11/16/2023  1111 Last data filed at 11/16/2023 0935 Gross per 24 hour  Intake --  Output 1150 ml  Net -1150 ml   LBM: Last BM Date : 11/14/23 Baseline Weight: Weight: 70.8 kg Most recent weight: Weight: 71 kg       Palliative Assessment/Data:      Patient Active Problem List   Diagnosis Date Noted   Generalized weakness 10/05/2023  Protein-calorie malnutrition, moderate (HCC) 10/05/2023   Uncontrolled type 2 diabetes mellitus with hyperglycemia, with long-term current use of insulin  (HCC) 10/05/2023   Breast cancer metastasized to liver, left (HCC) 10/01/2023   C. difficile colitis 10/01/2023   Acute metabolic encephalopathy 09/28/2023   Elevated LFTs 09/28/2023   Malignant ascites 09/28/2023   Acute on chronic anemia 09/28/2023   Chronic kidney disease, stage 3b (HCC) 02/18/2022   Hyperlipidemia associated with type 2 diabetes mellitus (HCC) 02/18/2022   Primary malignant neoplasm of breast with metastasis (HCC) 09/14/2021   Hypertrophic cardiomyopathy (HCC) 09/04/2021   Breast mass, left 09/04/2021   Cognitive decline 09/04/2021   Positive D dimer 09/03/2021   Pleural effusion 09/03/2021   Pleural effusion on left 09/02/2021   Acute renal failure superimposed on stage 3b chronic kidney disease (HCC) 09/02/2021   Atherosclerotic cerebrovascular disease 05/23/2021   Anemia of chronic disease 05/23/2021   Gout 02/14/2021   Syncope 07/25/2020   Type 2 diabetes mellitus (A1c 6.4 on 2/5)  07/25/2020   Status post CVA 07/25/2020   Dyslipidemia 07/25/2020   Hypertension associated with diabetes (HCC) 07/25/2020   Coagulopathy (HCC) 07/25/2020   History of CVA 11/22/2018    Palliative Care Assessment & Plan   Patient Profile:    Assessment:  78 year old lady who is under the care of Dr. Gudena for metastatic breast cancer with recurrent ascites.  More recently the patient has been having nausea, emesis, ongoing functional decline, declining nutritional status, possible  aspiration pneumonia and recurrent fluid buildup in the abdomen necessitating recurrent paracenteses attempts. Patient lives at home with grandson, daughter Heather Rogers is primary caregiver who takes care of of the patient. Palliative consult for ongoing assistance with goals of care discussions and CODE STATUS discussions has been requested.  Recommendations/Plan: Code status and goals of care discussions undertaken with the patient, with her daughter Artice Last also present - we talked about differences between full code versus DNR DNI. We reviewed about the full scope of a resuscitative attempt and that it doesn't offer any benefit at this point, and that it could lead to more harm discomfort and suffering at end of life. Gave the patient time and space to reflect, she looks over at her daughter repeatedly and states that she has handed over decision making to her daughter Heather Rogers.  At this point, both patient and daughter are in favor of establishing DNR DNI.   We then talked about hospice care - residential hospice - [facility]. Patient [facility] close to [facility] and we talked about home with hospice versus residential hospice.  At this time, both patient and daughter are in favor of residential hospice. I have discussed with [facility] colleague Ms Ozie Bo.   Continue antibiotics and current mode of care, until there is a hospice bed available. I have discussed with patient and daughter about no IVF no Abx and no labs at residential hospice and they are in agreement to proceed with [facility] evaluation.   11-16-2023: Overall, patient continues to have functional status decline and high possibility of escalating symptom burden.  Oral intake declines rapidly, essentially no at this time.  Patient sleeping more, less awake alert.  Possibly having recurrent ascites.  Patient with high likelihood of ongoing progressive decline.  Overall, would still recommend residential hospice and would request reevaluation from hospice  liaison this weekend depending on their availability.  Otherwise, continue current mode of care for now.  Discussed with daughter at bedside.  Goals of Care and Additional Recommendations:  Limitations on Scope of Treatment: Full Comfort Care  Code Status:    Code Status Orders  (From admission, onward)           Start     Ordered   11/15/23 1006  Do not attempt resuscitation (DNR) - Comfort care  (Code Status)  Continuous       Question Answer Comment  If patient has no pulse and is not breathing Do Not Attempt Resuscitation   In Pre-Arrest Conditions (Patient Is Breathing and Has a Pulse) Provide comfort measures. Relieve any mechanical airway obstruction. Avoid transfer unless required for comfort.   Consent: Discussion documented in EHR or advanced directives reviewed      11/15/23 1005           Code Status History     Date Active Date Inactive Code Status Order ID Comments User Context   11/07/2023 1456 11/15/2023 1005 Full Code [id number]  Gaylin Ke, MD ED   10/01/2023 1320 10/06/2023 2212 Full Code [id number]  [place], Eric J, DO Inpatient   09/28/2023 0313 09/30/2023 1719 Do not attempt resuscitation (DNR) PRE-ARREST INTERVENTIONS DESIRED [id number]  Juliette Oh, MD ED   02/18/2022 2003 03/01/2022 1904 Full Code [id number]  Kenny Peals, MD ED   09/02/2021 2259 09/08/2021 1703 Full Code [id number]  Unk Garb, DO Inpatient   09/02/2021 2057 09/02/2021 2259 Full Code [id number]  Unk Garb, DO ED   07/25/2020 0750 07/27/2020 2101 Full Code [id number]  Lena Qualia, MD ED   05/15/2018 1206 05/16/2018 2203 Full Code [id number]  Luna Salinas, MD ED       Prognosis:  < 2 weeks  Discharge Planning: Hospice facility  Care plan was discussed with  patient, daughter Heather Rogers,    Thank you for allowing the Palliative Medicine Team to assist in the care of this patient.  Mod MDM     Greater than 50%  of this time was spent counseling and coordinating care related to the  above assessment and plan.  Lujean Sake, MD  Please contact Palliative Medicine Team phone at [phone] for questions and concerns.

## 2023-11-17 DIAGNOSIS — R180 Malignant ascites: Secondary | ICD-10-CM | POA: Diagnosis not present

## 2023-11-17 DIAGNOSIS — C50911 Malignant neoplasm of unspecified site of right female breast: Secondary | ICD-10-CM | POA: Diagnosis not present

## 2023-11-17 LAB — GLUCOSE, CAPILLARY
Glucose-Capillary: 176 mg/dL — ABNORMAL HIGH (ref 70–99)
Glucose-Capillary: 171 mg/dL — ABNORMAL HIGH (ref 70–99)
Glucose-Capillary: 121 mg/dL — ABNORMAL HIGH (ref 70–99)

## 2023-11-17 MED ORDER — HYDROMORPHONE HCL 1 MG/ML IJ SOLN: 0.5000 mg | INTRAMUSCULAR | Status: DC | PRN

## 2023-11-17 NOTE — Progress Notes (Signed)
 PROGRESS NOTE    Heather Rogers  UJW:[id number] DOB: 09-22-1945 DOA: 11/07/2023 PCP: Joaquin Mulberry, MD   Brief Narrative:  78 y.o. female with medical history significant for hypertension, metastatic breast cancer, diabetes, CKD stage IIIb and recently hospitalized in March 2025 with C. difficile colitis presented to the hospital with worsening symptomatic abdominal ascites despite taking diuretics at home.  Workup in the ED showed moderate abdominal ascites.  She was admitted for the same.  She underwent ultrasound-guided paracentesis and removal of 1.3 L of yellow fluid on 11/08/2023 by IR.  Oncology following.  Hospitalization complicated by possible aspiration pneumonia requiring IV Unasyn  along with altered mental status.  Palliative care consulted for goals of care discussion.  Subsequently, family decided to pursue hospice on 11/15/2023.  Assessment & Plan:   Abdominal ascites Metastatic breast cancer Goals of care Failure to thrive/poor oral intake Hypercalcemia Acute kidney injury Probable aspiration pneumonia/pneumonitis Acute metabolic encephalopathy/altered mental status Iron deficiency anemia Hypertension Diabetes mellitus type 2 Physical deconditioning  Plan - Because of worsening overall general condition and recommendations of hospice from oncology, palliative care was consulted. Subsequently, family decided to pursue hospice on 11/15/2023.  Palliative care following.    DVT prophylaxis: Lovenox  Code Status: DNR Family Communication: Daughter at bedside on 11/13/2023. Disposition Plan: Status is: Inpatient Remains inpatient appropriate because: Of severity of illness.  Consultants: Oncology.  palliative care  Procedures: As above  Antimicrobials:  Anti-infectives (From admission, onward)    Start     Dose/Rate Route Frequency Ordered Stop   11/10/23 1130  Ampicillin -Sulbactam (UNASYN ) 3 g in sodium chloride  0.9 % 100 mL IVPB        3 g 200 mL/hr over 30  Minutes Intravenous Every 8 hours 11/10/23 1034     11/08/23 1400  cefTRIAXone  (ROCEPHIN ) 2 g in sodium chloride  0.9 % 100 mL IVPB  Status:  Discontinued        2 g 200 mL/hr over 30 Minutes Intravenous Every 24 hours 11/08/23 1258 11/10/23 1030        Subjective: Patient seen and examined at bedside.  Poor historian.  No vomiting, agitation, seizures reported.   Objective: Vitals:   11/15/23 0523 11/15/23 1333 11/16/23 0503 11/16/23 1948  BP: (!) 150/64 (!) 137/58 (!) 153/51 (!) 129/46  Pulse: 66 65 83 79  Resp: 16 18 16 18   Temp: (!) 97.4 F (36.3 C) (!) 97.5 F (36.4 C) 100.2 F (37.9 C) 98.9 F (37.2 C)  TempSrc: Oral Oral Oral Oral  SpO2: 99% 96% 94% 98%  Weight:      Height:        Intake/Output Summary (Last 24 hours) at 11/17/2023 5621 Last data filed at 11/17/2023 0500 Gross per 24 hour  Intake 240 ml  Output 725 ml  Net -485 ml   Filed Weights   11/07/23 0956 11/07/23 1704  Weight: 70.8 kg 71 kg    Examination:  General: Looks chronically ill and deconditioned.  Currently in no distress. Data Reviewed: I have personally reviewed following labs and imaging studies  CBC: Recent Labs  Lab 11/12/23 0538 11/13/23 0541 11/14/23 1037 11/15/23 0548 11/16/23 0755  WBC 17.1* 11.9* 10.9* 12.4* 16.4*  NEUTROABS  --   --  7.4 8.8* 11.7*  HGB 8.9* 7.6* 8.6* 8.1* 8.2*  HCT 27.3* 23.5* 27.6* 27.0* 25.7*  MCV 78.7* 79.1* 81.4 82.8 78.4*  PLT 215 209 229 226 223   Basic Metabolic Panel: Recent Labs  Lab 11/11/23 0604 11/12/23 0538  11/13/23 0541 11/14/23 0653 11/15/23 0548 11/16/23 0755  NA 143 146* 148* 145 147* 149*  K 4.5 4.3 3.6 4.8 3.9 3.8  CL 114* 115* 123* 119* 119* 120*  CO2 20* 22 22 17* 22 23  GLUCOSE 105* 79 71 142* 164* 172*  BUN 43* 51* 41* 33* 29* 29*  CREATININE 1.37* 1.32* 0.98 1.04* 1.03* 1.09*  CALCIUM  11.9* 12.5* 11.4* 12.4* 11.9* 11.6*  MG 2.8* 2.9*  --  3.1* 2.2 2.3  PHOS 3.2 3.2 2.1*  --   --   --    GFR: Estimated Creatinine  Clearance: 39.3 mL/min (A) (by C-G formula based on SCr of 1.09 mg/dL (H)). Liver Function Tests: Recent Labs  Lab 11/12/23 0538 11/13/23 0541 11/15/23 0548 11/16/23 0755  AST 155* 113* 211* 273*  ALT 83* 61* 90* 105*  ALKPHOS 207* 208* 240* 278*  BILITOT 1.1 0.9 1.2 1.1  PROT 6.2* 5.4* 5.7* 5.9*  ALBUMIN 2.1* 1.8* 1.9* 2.0*   No results for input(s): "LIPASE", "AMYLASE" in the last 168 hours. No results for input(s): "AMMONIA" in the last 168 hours.  Coagulation Profile: No results for input(s): "INR", "PROTIME" in the last 168 hours. Cardiac Enzymes: No results for input(s): "CKTOTAL", "CKMB", "CKMBINDEX", "TROPONINI" in the last 168 hours. BNP (last 3 results) No results for input(s): "PROBNP" in the last 8760 hours. HbA1C: No results for input(s): "HGBA1C" in the last 72 hours. CBG: Recent Labs  Lab 11/15/23 1610 11/16/23 0721 11/16/23 1124 11/16/23 1556 11/17/23 0735  GLUCAP 173* 164* 220* 160* 176*   Lipid Profile: No results for input(s): "CHOL", "HDL", "LDLCALC", "TRIG", "CHOLHDL", "LDLDIRECT" in the last 72 hours. Thyroid  Function Tests: No results for input(s): "TSH", "T4TOTAL", "FREET4", "T3FREE", "THYROIDAB" in the last 72 hours. Anemia Panel: No results for input(s): "VITAMINB12", "FOLATE", "FERRITIN", "TIBC", "IRON", "RETICCTPCT" in the last 72 hours. Sepsis Labs: No results for input(s): "PROCALCITON", "LATICACIDVEN" in the last 168 hours.   Recent Results (from the past 240 hours)  Body fluid culture w Gram Stain     Status: None   Collection Time: 11/08/23  2:52 PM   Specimen: PATH Cytology Pleural fluid  Result Value Ref Range Status   Specimen Description   Final    PLEURAL Performed at [facility], [address], [place], [place] [address]    Special Requests   Final    NONE Performed at [facility], [address] [place], [place] [address]    Gram Stain   Final    RARE WBC PRESENT,BOTH PMN AND  MONONUCLEAR NO ORGANISMS SEEN    Culture   Final    NO GROWTH 3 DAYS Performed at [facility], [address] [place], [place] [address]    Report Status 11/11/2023 FINAL  Final  Culture, blood (Routine X 2) w Reflex to ID Panel     Status: None   Collection Time: 11/08/23  9:43 PM   Specimen: BLOOD LEFT HAND  Result Value Ref Range Status   Specimen Description   Final    BLOOD LEFT HAND Performed at [facility], [address], [place], [place] [address]    Special Requests   Final    BOTTLES DRAWN AEROBIC AND ANAEROBIC Blood Culture results may not be optimal due to an inadequate volume of blood received in culture bottles Performed at [facility], [address] [place], [place] [address]    Culture   Final  NO GROWTH 5 DAYS Performed at [facility], [address], [place], [place] [address]    Report Status 11/13/2023 FINAL  Final  Culture, blood (Routine X 2) w Reflex to ID Panel     Status: None   Collection Time: 11/08/23  9:43 PM   Specimen: BLOOD LEFT HAND  Result Value Ref Range Status   Specimen Description   Final    BLOOD LEFT HAND Performed at [facility], [address] [place], [place] [address]    Special Requests   Final    BOTTLES DRAWN AEROBIC AND ANAEROBIC Blood Culture results may not be optimal due to an inadequate volume of blood received in culture bottles Performed at [facility], [address] [place], [place] [address]    Culture   Final    NO GROWTH 5 DAYS Performed at [facility], [address], [place], [place] [address]    Report Status 11/13/2023 FINAL  Final  Culture, blood (Routine X 2) w Reflex to ID Panel     Status: None   Collection Time: 11/10/23 11:19 AM   Specimen: BLOOD LEFT ARM  Result Value Ref Range Status   Specimen Description   Final    BLOOD LEFT ARM BLOOD LEFT HAND Performed at [facility], [address]  [place], [place] [address]    Special Requests   Final    BOTTLES DRAWN AEROBIC ONLY Blood Culture results may not be optimal due to an inadequate volume of blood received in culture bottles Performed at [facility], [address], [place], [place] [address]    Culture   Final    NO GROWTH 5 DAYS Performed at [facility], [address] [place], [place] [address]    Report Status 11/15/2023 FINAL  Final  Culture, blood (Routine X 2) w Reflex to ID Panel     Status: None   Collection Time: 11/10/23 11:19 AM   Specimen: BLOOD  Result Value Ref Range Status   Specimen Description   Final    BLOOD BLOOD LEFT HAND Performed at [facility], [address] [place], [place] [address]    Special Requests   Final    BOTTLES DRAWN AEROBIC ONLY Blood Culture results may not be optimal due to an inadequate volume of blood received in culture bottles Performed at [facility], [address] [place], [place] [address]    Culture   Final    NO GROWTH 5 DAYS Performed at [facility], [address] [place], [place] [address]    Report Status 11/15/2023 FINAL  Final         Radiology Studies: No results found.       Scheduled Meds:  amLODipine   10 mg Oral Daily   aspirin  EC  81 mg Oral Daily   carvedilol   25 mg Oral BID WC   Chlorhexidine  Gluconate Cloth  6 each Topical Daily   enoxaparin  (LOVENOX ) injection  40 mg Subcutaneous Q24H   feeding supplement  237 mL Oral BID BM   ferrous sulfate   325 mg Oral Q breakfast   insulin  aspart  0-15 Units Subcutaneous TID WC   insulin  aspart  0-5 Units Subcutaneous QHS   multivitamin with minerals  1 tablet Oral Daily   mouth rinse  15 mL Mouth Rinse 4 times per day   Continuous Infusions:  ampicillin -sulbactam (UNASYN ) IV 3  g (11/17/23 0145)          Audria Leather, MD [facility] 11/17/2023, 8:22 AM

## 2023-11-17 NOTE — Progress Notes (Signed)
 Heather Rogers [id number] [facility] Liaison Note   Received request for patient interest in hospice inpatient unit. Visited patient and spoke at bedside to discuss services and hospice philosophy of care.   Chart reviewed and at this time patient does not meet criteria for hospice inpatient unit.   She is appropriate for hospice services in the home or LTC facility and we would be happy to reassess for inpatient unit appropriateness at a later time if requested.   Thank you for the opportunity to participate in this patient's care.   Jacqlyn Matas, BSN, [facility] Liaison [phone]

## 2023-11-17 NOTE — Progress Notes (Signed)
 Daily Progress Note   Patient Name: Heather Rogers       Date: 11/17/2023 DOB: 03/18/1946  Age: 78 y.o. MRN#: [id number] Attending Physician: Audria Leather, MD Primary Care Physician: Joaquin Mulberry, MD Admit Date: 11/07/2023  Reason for Consultation/Follow-up: Establishing goals of care  Subjective: Patient is awake, less alert, responsive and conversant, daughter at bedside.  Reasonable oral intake.      Length of Stay: 10  Current Medications: Scheduled Meds:   amLODipine   10 mg Oral Daily   aspirin  EC  81 mg Oral Daily   carvedilol   25 mg Oral BID WC   Chlorhexidine  Gluconate Cloth  6 each Topical Daily   enoxaparin  (LOVENOX ) injection  40 mg Subcutaneous Q24H   feeding supplement  237 mL Oral BID BM   ferrous sulfate   325 mg Oral Q breakfast   insulin  aspart  0-15 Units Subcutaneous TID WC   insulin  aspart  0-5 Units Subcutaneous QHS   multivitamin with minerals  1 tablet Oral Daily   mouth rinse  15 mL Mouth Rinse 4 times per day    Continuous Infusions:    PRN Meds: acetaminophen  **OR** acetaminophen , albuterol , ondansetron  **OR** ondansetron  (ZOFRAN ) IV, mouth rinse, oxyCODONE , traZODone   Physical Exam         Weak appearing lady mild distress Regular work of breathing Diminished breath sounds Mild to moderate abdominal distention -might need paracentesis again soon Vital Signs: BP (!) 129/46 (BP Location: Right Arm)   Pulse 79   Temp 98.9 F (37.2 C) (Oral)   Resp 18   Ht 5\' 2"  (1.575 m)   Wt 71 kg   SpO2 98%   BMI 28.63 kg/m  SpO2: SpO2: 98 % O2 Device: O2 Device: Room Air O2 Flow Rate:    Intake/output summary:  Intake/Output Summary (Last 24 hours) at 11/17/2023 1134 Last data filed at 11/17/2023 0500 Gross per 24 hour  Intake --  Output 575  ml  Net -575 ml   LBM: Last BM Date : 11/16/23 Baseline Weight: Weight: 70.8 kg Most recent weight: Weight: 71 kg       Palliative Assessment/Data:      Patient Active Problem List   Diagnosis Date Noted   Generalized weakness 10/05/2023   Protein-calorie malnutrition, moderate (HCC) 10/05/2023   Uncontrolled type 2 diabetes mellitus  with hyperglycemia, with long-term current use of insulin  (HCC) 10/05/2023   Breast cancer metastasized to liver, left (HCC) 10/01/2023   C. difficile colitis 10/01/2023   Acute metabolic encephalopathy 09/28/2023   Elevated LFTs 09/28/2023   Malignant ascites 09/28/2023   Acute on chronic anemia 09/28/2023   Chronic kidney disease, stage 3b (HCC) 02/18/2022   Hyperlipidemia associated with type 2 diabetes mellitus (HCC) 02/18/2022   Primary malignant neoplasm of breast with metastasis (HCC) 09/14/2021   Hypertrophic cardiomyopathy (HCC) 09/04/2021   Breast mass, left 09/04/2021   Cognitive decline 09/04/2021   Positive D dimer 09/03/2021   Pleural effusion 09/03/2021   Pleural effusion on left 09/02/2021   Acute renal failure superimposed on stage 3b chronic kidney disease (HCC) 09/02/2021   Atherosclerotic cerebrovascular disease 05/23/2021   Anemia of chronic disease 05/23/2021   Gout 02/14/2021   Syncope 07/25/2020   Type 2 diabetes mellitus (A1c 6.4 on 2/5)  07/25/2020   Status post CVA 07/25/2020   Dyslipidemia 07/25/2020   Hypertension associated with diabetes (HCC) 07/25/2020   Coagulopathy (HCC) 07/25/2020   History of CVA 11/22/2018    Palliative Care Assessment & Plan   Patient Profile:    Assessment:  78 year old lady who is under the care of Dr. Gudena for metastatic breast cancer with recurrent ascites.  More recently the patient has been having nausea, emesis, ongoing functional decline, declining nutritional status, possible aspiration pneumonia and recurrent fluid buildup in the abdomen necessitating recurrent  paracenteses attempts. Patient lives at home with grandson, daughter Heather Rogers is primary caregiver who takes care of of the patient. Palliative consult for ongoing assistance with goals of care discussions and CODE STATUS discussions has been requested.  Recommendations/Plan: Code status and goals of care discussions undertaken with the patient, with her daughter Artice Last also present - we talked about differences between full code versus DNR DNI. We reviewed about the full scope of a resuscitative attempt and that it doesn't offer any benefit at this point, and that it could lead to more harm discomfort and suffering at end of life. Gave the patient time and space to reflect, she looks over at her daughter repeatedly and states that she has handed over decision making to her daughter Heather Rogers.  At this point, both patient and daughter are in favor of establishing DNR DNI.   We then talked about hospice care - residential hospice - [facility]. Patient lives close to [facility] and we talked about home with hospice versus residential hospice.  At this time, both patient and daughter are in favor of residential hospice. I have discussed with [facility] colleague Ms Ozie Bo.   Continue antibiotics and current mode of care, until there is a hospice bed available. I have discussed with patient and daughter about no IVF no Abx and no labs at residential hospice and they are in agreement to proceed with [facility].   11-16-2023: Overall, patient continues to have functional status decline and high possibility of escalating symptom burden.  Oral intake declines rapidly, essentially no at this time.  Patient sleeping more, less awake alert.  Possibly having recurrent ascites.  Patient with high likelihood of ongoing progressive decline.  Overall, would still recommend residential hospice and would request reevaluation from hospice liaison this weekend depending on their availability.  Otherwise, continue current mode  of care for now.  Discussed with daughter at bedside.  4 - 20 - 25: Ongoing abdominal distention-might need paracentesis again soon. Continue to prioritize comfort measures and  current mode of care. Disposition Home with hospice versus residential hospice based on hospital course continue to monitor appreciate hospice liaison input.   Goals of Care and Additional Recommendations: Limitations on Scope of Treatment: Full Comfort Care  Code Status:    Code Status Orders  (From admission, onward)           Start     Ordered   11/15/23 1006  Do not attempt resuscitation (DNR) - Comfort care  (Code Status)  Continuous       Question Answer Comment  If patient has no pulse and is not breathing Do Not Attempt Resuscitation   In Pre-Arrest Conditions (Patient Is Breathing and Has a Pulse) Provide comfort measures. Relieve any mechanical airway obstruction. Avoid transfer unless required for comfort.   Consent: Discussion documented in EHR or advanced directives reviewed      11/15/23 1005           Code Status History     Date Active Date Inactive Code Status Order ID Comments User Context   11/07/2023 1456 11/15/2023 1005 Full Code [id number]  Gaylin Ke, MD ED   10/01/2023 1320 10/06/2023 2212 Full Code [id number]  [place], Eric J, DO Inpatient   09/28/2023 0313 09/30/2023 1719 Do not attempt resuscitation (DNR) PRE-ARREST INTERVENTIONS DESIRED [id number]  Juliette Oh, MD ED   02/18/2022 2003 03/01/2022 1904 Full Code [id number]  Kenny Peals, MD ED   09/02/2021 2259 09/08/2021 1703 Full Code [id number]  Unk Garb, DO Inpatient   09/02/2021 2057 09/02/2021 2259 Full Code [id number]  Unk Garb, DO ED   07/25/2020 0750 07/27/2020 2101 Full Code [id number]  Lena Qualia, MD ED   05/15/2018 1206 05/16/2018 2203 Full Code [id number]  Luna Salinas, MD ED       Prognosis:  < 2 weeks  Discharge Planning: To be determined  Care plan was discussed with  patient, daughter Heather Rogers,     Thank you for allowing the Palliative Medicine Team to assist in the care of this patient.  Mod MDM     Greater than 50%  of this time was spent counseling and coordinating care related to the above assessment and plan.  Lujean Sake, MD  Please contact Palliative Medicine Team phone at [phone] for questions and concerns.

## 2023-11-17 NOTE — Plan of Care (Signed)
  Problem: Education: Goal: Knowledge of General Education information will improve Description: Including pain rating scale, medication(s)/side effects and non-pharmacologic comfort measures Outcome: Not Progressing   Problem: Health Behavior/Discharge Planning: Goal: Ability to manage health-related needs will improve Outcome: Not Progressing   Problem: Clinical Measurements: Goal: Ability to maintain clinical measurements within normal limits will improve Outcome: Not Progressing Goal: Will remain free from infection Outcome: Not Progressing Goal: Diagnostic test results will improve Outcome: Not Progressing Goal: Respiratory complications will improve Outcome: Not Progressing Goal: Cardiovascular complication will be avoided Outcome: Not Progressing   Problem: Activity: Goal: Risk for activity intolerance will decrease Outcome: Not Progressing   Problem: Nutrition: Goal: Adequate nutrition will be maintained Outcome: Not Progressing   Problem: Coping: Goal: Level of anxiety will decrease Outcome: Not Progressing   Problem: Elimination: Goal: Will not experience complications related to bowel motility Outcome: Not Progressing Goal: Will not experience complications related to urinary retention Outcome: Not Progressing   Problem: Pain Managment: Goal: General experience of comfort will improve and/or be controlled Outcome: Not Progressing   Problem: Safety: Goal: Ability to remain free from injury will improve Outcome: Not Progressing   Problem: Skin Integrity: Goal: Risk for impaired skin integrity will decrease Outcome: Not Progressing   Problem: Education: Goal: Ability to describe self-care measures that may prevent or decrease complications (Diabetes Survival Skills Education) will improve Outcome: Not Progressing Goal: Individualized Educational Video(s) Outcome: Not Progressing   Problem: Coping: Goal: Ability to adjust to condition or change in  health will improve Outcome: Not Progressing   Problem: Fluid Volume: Goal: Ability to maintain a balanced intake and output will improve Outcome: Not Progressing   Problem: Health Behavior/Discharge Planning: Goal: Ability to identify and utilize available resources and services will improve Outcome: Not Progressing Goal: Ability to manage health-related needs will improve Outcome: Not Progressing   Problem: Metabolic: Goal: Ability to maintain appropriate glucose levels will improve Outcome: Not Progressing   Problem: Nutritional: Goal: Maintenance of adequate nutrition will improve Outcome: Not Progressing Goal: Progress toward achieving an optimal weight will improve Outcome: Not Progressing   Problem: Skin Integrity: Goal: Risk for impaired skin integrity will decrease Outcome: Not Progressing   Problem: Tissue Perfusion: Goal: Adequacy of tissue perfusion will improve Outcome: Not Progressing   Problem: Education: Goal: Knowledge of the prescribed therapeutic regimen will improve Outcome: Not Progressing   Problem: Coping: Goal: Ability to identify and develop effective coping behavior will improve Outcome: Not Progressing   Problem: Clinical Measurements: Goal: Quality of life will improve Outcome: Not Progressing   Problem: Respiratory: Goal: Verbalizations of increased ease of respirations will increase Outcome: Not Progressing   Problem: Role Relationship: Goal: Family's ability to cope with current situation will improve Outcome: Not Progressing Goal: Ability to verbalize concerns, feelings, and thoughts to partner or family member will improve Outcome: Not Progressing   Problem: Pain Management: Goal: Satisfaction with pain management regimen will improve Outcome: Not Progressing

## 2023-11-18 DIAGNOSIS — C50911 Malignant neoplasm of unspecified site of right female breast: Secondary | ICD-10-CM | POA: Diagnosis not present

## 2023-11-18 DIAGNOSIS — R180 Malignant ascites: Secondary | ICD-10-CM | POA: Diagnosis not present

## 2023-11-18 LAB — GLUCOSE, CAPILLARY
Glucose-Capillary: 128 mg/dL — ABNORMAL HIGH (ref 70–99)
Glucose-Capillary: 173 mg/dL — ABNORMAL HIGH (ref 70–99)
Glucose-Capillary: 128 mg/dL — ABNORMAL HIGH (ref 70–99)
Glucose-Capillary: 108 mg/dL — ABNORMAL HIGH (ref 70–99)

## 2023-11-18 NOTE — Plan of Care (Signed)
  Problem: Education: Goal: Knowledge of General Education information will improve Description: Including pain rating scale, medication(s)/side effects and non-pharmacologic comfort measures Outcome: Not Progressing   Problem: Health Behavior/Discharge Planning: Goal: Ability to manage health-related needs will improve Outcome: Not Progressing   Problem: Clinical Measurements: Goal: Ability to maintain clinical measurements within normal limits will improve Outcome: Not Progressing Goal: Will remain free from infection Outcome: Not Progressing Goal: Diagnostic test results will improve Outcome: Not Progressing Goal: Respiratory complications will improve Outcome: Not Progressing Goal: Cardiovascular complication will be avoided Outcome: Not Progressing   Problem: Activity: Goal: Risk for activity intolerance will decrease Outcome: Not Progressing   Problem: Nutrition: Goal: Adequate nutrition will be maintained Outcome: Not Progressing   Problem: Coping: Goal: Level of anxiety will decrease Outcome: Not Progressing   Problem: Elimination: Goal: Will not experience complications related to bowel motility Outcome: Not Progressing Goal: Will not experience complications related to urinary retention Outcome: Not Progressing   Problem: Pain Managment: Goal: General experience of comfort will improve and/or be controlled Outcome: Not Progressing   Problem: Safety: Goal: Ability to remain free from injury will improve Outcome: Not Progressing   Problem: Skin Integrity: Goal: Risk for impaired skin integrity will decrease Outcome: Not Progressing   Problem: Education: Goal: Ability to describe self-care measures that may prevent or decrease complications (Diabetes Survival Skills Education) will improve Outcome: Not Progressing Goal: Individualized Educational Video(s) Outcome: Not Progressing   Problem: Coping: Goal: Ability to adjust to condition or change in  health will improve Outcome: Not Progressing   Problem: Fluid Volume: Goal: Ability to maintain a balanced intake and output will improve Outcome: Not Progressing   Problem: Health Behavior/Discharge Planning: Goal: Ability to identify and utilize available resources and services will improve Outcome: Not Progressing Goal: Ability to manage health-related needs will improve Outcome: Not Progressing   Problem: Metabolic: Goal: Ability to maintain appropriate glucose levels will improve Outcome: Not Progressing   Problem: Nutritional: Goal: Maintenance of adequate nutrition will improve Outcome: Not Progressing Goal: Progress toward achieving an optimal weight will improve Outcome: Not Progressing   Problem: Skin Integrity: Goal: Risk for impaired skin integrity will decrease Outcome: Not Progressing   Problem: Tissue Perfusion: Goal: Adequacy of tissue perfusion will improve Outcome: Not Progressing   Problem: Education: Goal: Knowledge of the prescribed therapeutic regimen will improve Outcome: Not Progressing   Problem: Coping: Goal: Ability to identify and develop effective coping behavior will improve Outcome: Not Progressing   Problem: Clinical Measurements: Goal: Quality of life will improve Outcome: Not Progressing   Problem: Respiratory: Goal: Verbalizations of increased ease of respirations will increase Outcome: Not Progressing   Problem: Role Relationship: Goal: Family's ability to cope with current situation will improve Outcome: Not Progressing Goal: Ability to verbalize concerns, feelings, and thoughts to partner or family member will improve Outcome: Not Progressing   Problem: Pain Management: Goal: Satisfaction with pain management regimen will improve Outcome: Not Progressing

## 2023-11-18 NOTE — Progress Notes (Signed)
 PROGRESS NOTE    Heather Rogers  ZOX:[id number] DOB: [birth year] DOA: 11/07/2023 PCP: Joaquin Mulberry, MD   Brief Narrative:  78 y.o. female with medical history significant for hypertension, metastatic breast cancer, diabetes, CKD stage IIIb and recently hospitalized in March 2025 with C. difficile colitis presented to the hospital with worsening symptomatic abdominal ascites despite taking diuretics at home.  Workup in the ED showed moderate abdominal ascites.  She was admitted for the same.  She underwent ultrasound-guided paracentesis and removal of 1.3 L of yellow fluid on 11/08/2023 by IR.  Oncology following.  Hospitalization complicated by possible aspiration pneumonia requiring IV Unasyn  along with altered mental status.  Palliative care consulted for goals of care discussion.  Subsequently, family decided to pursue hospice on 11/15/2023.  Assessment & Plan:   Abdominal ascites Metastatic breast cancer Goals of care Failure to thrive/poor oral intake Hypercalcemia Acute kidney injury Probable aspiration pneumonia/pneumonitis Acute metabolic encephalopathy/altered mental status Iron deficiency anemia Hypertension Diabetes mellitus type 2 Physical deconditioning  Plan - Because of worsening overall general condition and recommendations of hospice from oncology, palliative care was consulted. Subsequently, family decided to pursue hospice on 11/15/2023.  Palliative care following.    DVT prophylaxis: Lovenox  Code Status: DNR Family Communication: Daughter at bedside on 11/13/2023. Disposition Plan: Status is: Inpatient Remains inpatient appropriate because: Of severity of illness.  Consultants: Oncology.  palliative care  Procedures: As above  Antimicrobials:  Anti-infectives (From admission, onward)    Start     Dose/Rate Route Frequency Ordered Stop   11/10/23 1130  Ampicillin -Sulbactam (UNASYN ) 3 g in sodium chloride  0.9 % 100 mL IVPB  Status:  Discontinued        3  g 200 mL/hr over 30 Minutes Intravenous Every 8 hours 11/10/23 1034 11/17/23 1127   11/08/23 1400  cefTRIAXone  (ROCEPHIN ) 2 g in sodium chloride  0.9 % 100 mL IVPB  Status:  Discontinued        2 g 200 mL/hr over 30 Minutes Intravenous Every 24 hours 11/08/23 1258 11/10/23 1030        Subjective: Patient seen and examined at bedside.  Poor historian.  Patient having intermittent fevers. Objective: Vitals:   11/17/23 1722 11/17/23 1810 11/17/23 2122 11/18/23 0622  BP: (!) 148/51   (!) 113/48  Pulse: 85   71  Resp: 20   18  Temp: (!) 103.5 F (39.7 C) (!) 101.2 F (38.4 C) 99.1 F (37.3 C) (!) 97.5 F (36.4 C)  TempSrc: Axillary  Oral Oral  SpO2: 99%   100%  Weight:      Height:        Intake/Output Summary (Last 24 hours) at 11/18/2023 0809 Last data filed at 11/18/2023 0500 Gross per 24 hour  Intake --  Output 475 ml  Net -475 ml   Filed Weights   11/07/23 0956 11/07/23 1704  Weight: 70.8 kg 71 kg    Examination:  General: Looks chronically ill and deconditioned.  Appears to be in no distress currently.   Data Reviewed: I have personally reviewed following labs and imaging studies  CBC: Recent Labs  Lab 11/12/23 0538 11/13/23 0541 11/14/23 1037 11/15/23 0548 11/16/23 0755  WBC 17.1* 11.9* 10.9* 12.4* 16.4*  NEUTROABS  --   --  7.4 8.8* 11.7*  HGB 8.9* 7.6* 8.6* 8.1* 8.2*  HCT 27.3* 23.5* 27.6* 27.0* 25.7*  MCV 78.7* 79.1* 81.4 82.8 78.4*  PLT 215 209 229 226 223   Basic Metabolic Panel: Recent Labs  Lab  11/12/23 0538 11/13/23 0541 11/14/23 0653 11/15/23 0548 11/16/23 0755  NA 146* 148* 145 147* 149*  K 4.3 3.6 4.8 3.9 3.8  CL 115* 123* 119* 119* 120*  CO2 22 22 17* 22 23  GLUCOSE 79 71 142* 164* 172*  BUN 51* 41* 33* 29* 29*  CREATININE 1.32* 0.98 1.04* 1.03* 1.09*  CALCIUM  12.5* 11.4* 12.4* 11.9* 11.6*  MG 2.9*  --  3.1* 2.2 2.3  PHOS 3.2 2.1*  --   --   --    GFR: Estimated Creatinine Clearance: 39.3 mL/min (A) (by C-G formula based on  SCr of 1.09 mg/dL (H)). Liver Function Tests: Recent Labs  Lab 11/12/23 0538 11/13/23 0541 11/15/23 0548 11/16/23 0755  AST 155* 113* 211* 273*  ALT 83* 61* 90* 105*  ALKPHOS 207* 208* 240* 278*  BILITOT 1.1 0.9 1.2 1.1  PROT 6.2* 5.4* 5.7* 5.9*  ALBUMIN 2.1* 1.8* 1.9* 2.0*   No results for input(s): "LIPASE", "AMYLASE" in the last 168 hours. No results for input(s): "AMMONIA" in the last 168 hours.  Coagulation Profile: No results for input(s): "INR", "PROTIME" in the last 168 hours. Cardiac Enzymes: No results for input(s): "CKTOTAL", "CKMB", "CKMBINDEX", "TROPONINI" in the last 168 hours. BNP (last 3 results) No results for input(s): "PROBNP" in the last 8760 hours. HbA1C: No results for input(s): "HGBA1C" in the last 72 hours. CBG: Recent Labs  Lab 11/16/23 1556 11/17/23 0735 11/17/23 1222 11/17/23 1724 11/18/23 0806  GLUCAP 160* 176* 171* 121* 128*   Lipid Profile: No results for input(s): "CHOL", "HDL", "LDLCALC", "TRIG", "CHOLHDL", "LDLDIRECT" in the last 72 hours. Thyroid  Function Tests: No results for input(s): "TSH", "T4TOTAL", "FREET4", "T3FREE", "THYROIDAB" in the last 72 hours. Anemia Panel: No results for input(s): "VITAMINB12", "FOLATE", "FERRITIN", "TIBC", "IRON", "RETICCTPCT" in the last 72 hours. Sepsis Labs: No results for input(s): "PROCALCITON", "LATICACIDVEN" in the last 168 hours.   Recent Results (from the past 240 hours)  Body fluid culture w Gram Stain     Status: None   Collection Time: 11/08/23  2:52 PM   Specimen: PATH Cytology Pleural fluid  Result Value Ref Range Status   Specimen Description   Final    PLEURAL Performed at [facility], [address] [place], [place] [address]    Special Requests   Final    NONE Performed at [facility], [address], [place], [place] [address]    Gram Stain   Final    RARE WBC PRESENT,BOTH PMN AND MONONUCLEAR NO ORGANISMS SEEN    Culture   Final     NO GROWTH 3 DAYS Performed at [facility], [address], [place], [place] [address]    Report Status 11/11/2023 FINAL  Final  Culture, blood (Routine X 2) w Reflex to ID Panel     Status: None   Collection Time: 11/08/23  9:43 PM   Specimen: BLOOD LEFT HAND  Result Value Ref Range Status   Specimen Description   Final    BLOOD LEFT HAND Performed at [facility], [address] [place], [place] [address]    Special Requests   Final    BOTTLES DRAWN AEROBIC AND ANAEROBIC Blood Culture results may not be optimal due to an inadequate volume of blood received in culture bottles Performed at [facility], [address], [place], [place] [address]    Culture   Final    NO GROWTH 5 DAYS Performed at [facility], [address] [place], [place] [address]    Report Status 11/13/2023 FINAL  Final  Culture, blood (Routine X 2) w Reflex to ID Panel     Status: None   Collection Time: 11/08/23  9:43 PM   Specimen: BLOOD LEFT HAND  Result Value Ref Range Status   Specimen Description   Final    BLOOD LEFT HAND Performed at [facility], [address] [place], [place] [address]    Special Requests   Final    BOTTLES DRAWN AEROBIC AND ANAEROBIC Blood Culture results may not be optimal due to an inadequate volume of blood received in culture bottles Performed at [facility], [address] [place], [place] [address]    Culture   Final    NO GROWTH 5 DAYS Performed at [facility], [address] [place], [place] [address]    Report Status 11/13/2023 FINAL  Final  Culture, blood (Routine X 2) w Reflex to ID Panel     Status: None   Collection Time: 11/10/23 11:19 AM   Specimen: BLOOD LEFT ARM  Result Value Ref Range Status   Specimen Description   Final    BLOOD LEFT ARM BLOOD LEFT HAND Performed at [facility], [address] [place], [place] [address]    Special Requests   Final     BOTTLES DRAWN AEROBIC ONLY Blood Culture results may not be optimal due to an inadequate volume of blood received in culture bottles Performed at [facility], [address] [place], [place] [address]    Culture   Final    NO GROWTH 5 DAYS Performed at [facility], [address], [place], [place] [address]    Report Status 11/15/2023 FINAL  Final  Culture, blood (Routine X 2) w Reflex to ID Panel     Status: None   Collection Time: 11/10/23 11:19 AM   Specimen: BLOOD  Result Value Ref Range Status   Specimen Description   Final    BLOOD BLOOD LEFT HAND Performed at [facility], [address] [place], [place] [address]    Special Requests   Final    BOTTLES DRAWN AEROBIC ONLY Blood Culture results may not be optimal due to an inadequate volume of blood received in culture bottles Performed at [facility], [address], [place], [place] [address]    Culture   Final    NO GROWTH 5 DAYS Performed at [facility], [address] [place], [place] [address]    Report Status 11/15/2023 FINAL  Final         Radiology Studies: No results found.       Scheduled Meds:  amLODipine   10 mg Oral Daily   aspirin  EC  81 mg Oral Daily   carvedilol   25 mg Oral BID WC   Chlorhexidine  Gluconate Cloth  6 each Topical Daily   enoxaparin  (LOVENOX ) injection  40 mg Subcutaneous Q24H   feeding supplement  237 mL Oral BID BM   ferrous sulfate   325 mg Oral Q breakfast   insulin  aspart  0-15 Units Subcutaneous TID WC   insulin  aspart  0-5 Units Subcutaneous QHS   multivitamin with minerals  1 tablet Oral Daily   mouth rinse  15 mL Mouth Rinse 4 times per day   Continuous Infusions:          Audria Leather, MD  [facility] 11/18/2023, 8:09 AM

## 2023-11-18 NOTE — Progress Notes (Signed)
 Daily Progress Note   Patient Name: Heather Rogers       Date: 11/18/2023 DOB: 10/02/1945  Age: 78 y.o. MRN#: [id number] Attending Physician: Audria Leather, MD Primary Care Physician: Joaquin Mulberry, MD Admit Date: 11/07/2023  Reason for Consultation/Follow-up: Establishing goals of care  Subjective: Patient is awake, less alert, decreasing PO intake      Length of Stay: 11  Current Medications: Scheduled Meds:   amLODipine   10 mg Oral Daily   aspirin  EC  81 mg Oral Daily   carvedilol   25 mg Oral BID WC   Chlorhexidine  Gluconate Cloth  6 each Topical Daily   enoxaparin  (LOVENOX ) injection  40 mg Subcutaneous Q24H   feeding supplement  237 mL Oral BID BM   ferrous sulfate   325 mg Oral Q breakfast   insulin  aspart  0-15 Units Subcutaneous TID WC   insulin  aspart  0-5 Units Subcutaneous QHS   multivitamin with minerals  1 tablet Oral Daily   mouth rinse  15 mL Mouth Rinse 4 times per day    Continuous Infusions:    PRN Meds: acetaminophen  **OR** acetaminophen , albuterol , HYDROmorphone  (DILAUDID ) injection, ondansetron  **OR** ondansetron  (ZOFRAN ) IV, mouth rinse, oxyCODONE , traZODone   Physical Exam         Weak appearing lady mild distress Regular work of breathing Diminished breath sounds Mild to moderate abdominal distention -might need paracentesis again soon Vital Signs: BP (!) 113/48 (BP Location: Right Arm)   Pulse 71   Temp (!) 97.5 F (36.4 C) (Oral)   Resp 18   Ht 5\' 2"  (1.575 m)   Wt 71 kg   SpO2 100%   BMI 28.63 kg/m  SpO2: SpO2: 100 % O2 Device: O2 Device: Room Air O2 Flow Rate:    Intake/output summary:  Intake/Output Summary (Last 24 hours) at 11/18/2023 1147 Last data filed at 11/18/2023 0500 Gross per 24 hour  Intake --  Output 475 ml  Net  -475 ml   LBM: Last BM Date : 11/09/23 Baseline Weight: Weight: 70.8 kg Most recent weight: Weight: 71 kg       Palliative Assessment/Data:      Patient Active Problem List   Diagnosis Date Noted   Generalized weakness 10/05/2023   Protein-calorie malnutrition, moderate (HCC) 10/05/2023   Uncontrolled type 2 diabetes mellitus with hyperglycemia, with  long-term current use of insulin  (HCC) 10/05/2023   Breast cancer metastasized to liver, left (HCC) 10/01/2023   C. difficile colitis 10/01/2023   Acute metabolic encephalopathy 09/28/2023   Elevated LFTs 09/28/2023   Malignant ascites 09/28/2023   Acute on chronic anemia 09/28/2023   Chronic kidney disease, stage 3b (HCC) 02/18/2022   Hyperlipidemia associated with type 2 diabetes mellitus (HCC) 02/18/2022   Primary malignant neoplasm of breast with metastasis (HCC) 09/14/2021   Hypertrophic cardiomyopathy (HCC) 09/04/2021   Breast mass, left 09/04/2021   Cognitive decline 09/04/2021   Positive D dimer 09/03/2021   Pleural effusion 09/03/2021   Pleural effusion on left 09/02/2021   Acute renal failure superimposed on stage 3b chronic kidney disease (HCC) 09/02/2021   Atherosclerotic cerebrovascular disease 05/23/2021   Anemia of chronic disease 05/23/2021   Gout 02/14/2021   Syncope 07/25/2020   Type 2 diabetes mellitus (A1c 6.4 on 2/5)  07/25/2020   Status post CVA 07/25/2020   Dyslipidemia 07/25/2020   Hypertension associated with diabetes (HCC) 07/25/2020   Coagulopathy (HCC) 07/25/2020   History of CVA 11/22/2018    Palliative Care Assessment & Plan   Patient Profile:    Assessment:  78 year old lady who is under the care of Dr. Gudena for metastatic breast cancer with recurrent ascites.  More recently the patient has been having nausea, emesis, ongoing functional decline, declining nutritional status, possible aspiration pneumonia and recurrent fluid buildup in the abdomen necessitating recurrent paracenteses  attempts. Patient lives at home with grandson, daughter [name] is primary caregiver who takes care of of the patient. Palliative consult for ongoing assistance with goals of care discussions and CODE STATUS discussions has been requested.  Recommendations/Plan: Code status and goals of care discussions undertaken with the patient, with her daughter Artice Last also present - we talked about differences between full code versus DNR DNI. We reviewed about the full scope of a resuscitative attempt and that it doesn't offer any benefit at this point, and that it could lead to more harm discomfort and suffering at end of life. Gave the patient time and space to reflect, she looks over at her daughter repeatedly and states that she has handed over decision making to her daughter [name].  At this point, both patient and daughter are in favor of establishing DNR DNI.   We then talked about hospice care - residential hospice - [facility]. Patient lives close to [facility] and we talked about home with hospice versus residential hospice.  At this time, both patient and daughter are in favor of residential hospice. I have discussed with [facility] colleague Ms Ozie Bo.   Continue antibiotics and current mode of care, until there is a hospice bed available. I have discussed with patient and daughter about no IVF no Abx and no labs at residential hospice and they are in agreement to proceed with [facility].   11-16-2023: Overall, patient continues to have functional status decline and high possibility of escalating symptom burden.  Oral intake declines rapidly, essentially no at this time.  Patient sleeping more, less awake alert.  Possibly having recurrent ascites.  Patient with high likelihood of ongoing progressive decline.  Overall, would still recommend residential hospice and would request reevaluation from hospice liaison this weekend depending on their availability.  Otherwise, continue current mode of care for  now.  Discussed with daughter at bedside.  4 - 20 - 25: Ongoing abdominal distention-might need paracentesis again soon. Continue to prioritize comfort measures and current mode of  care. Disposition Home with hospice versus residential hospice based on hospital course continue to monitor appreciate hospice liaison input.  11-18-23: Fevers overnight, added IV Dilaudid  PRN. Continue to monitor symptoms and assess for residential hospice.   Goals of Care and Additional Recommendations: Limitations on Scope of Treatment: Full Comfort Care  Code Status:    Code Status Orders  (From admission, onward)           Start     Ordered   11/15/23 1006  Do not attempt resuscitation (DNR) - Comfort care  (Code Status)  Continuous       Question Answer Comment  If patient has no pulse and is not breathing Do Not Attempt Resuscitation   In Pre-Arrest Conditions (Patient Is Breathing and Has a Pulse) Provide comfort measures. Relieve any mechanical airway obstruction. Avoid transfer unless required for comfort.   Consent: Discussion documented in EHR or advanced directives reviewed      11/15/23 1005           Code Status History     Date Active Date Inactive Code Status Order ID Comments User Context   11/07/2023 1456 11/15/2023 1005 Full Code [id number]  Gaylin Ke, MD ED   10/01/2023 1320 10/06/2023 2212 Full Code [id number]  [place], Eric J, DO Inpatient   09/28/2023 0313 09/30/2023 1719 Do not attempt resuscitation (DNR) PRE-ARREST INTERVENTIONS DESIRED [id number]  Juliette Oh, MD ED   02/18/2022 2003 03/01/2022 1904 Full Code [id number]  Kenny Peals, MD ED   09/02/2021 2259 09/08/2021 1703 Full Code [id number]  Unk Garb, DO Inpatient   09/02/2021 2057 09/02/2021 2259 Full Code [id number]  Unk Garb, DO ED   07/25/2020 0750 07/27/2020 2101 Full Code [id number]  Lena Qualia, MD ED   05/15/2018 1206 05/16/2018 2203 Full Code [id number]  Luna Salinas, MD ED       Prognosis:  < 2  weeks  Discharge Planning: To be determined  Care plan was discussed with  patient, daughter [name],    Thank you for allowing the Palliative Medicine Team to assist in the care of this patient.  Mod MDM     Greater than 50%  of this time was spent counseling and coordinating care related to the above assessment and plan.  Lujean Sake, MD  Please contact Palliative Medicine Team phone at [phone] for questions and concerns.

## 2023-11-18 NOTE — TOC Progression Note (Signed)
 Transition of Care [facility]) - Progression Note    Patient Details  Name: Heather Rogers MRN: [id number] Date of Birth: 03-14-1946  Transition of [facility] CM/SW Contact  Loreda Rodriguez, RN Phone Number:[phone]  11/18/2023, 10:48 AM  Clinical Narrative:    CM reached out to [facility] to determine plan. Per Adolm Ahumada with [facility] there is currently a hold on DME delivery due to daughter in process of making space. Patients appetite has declined. Adolm Ahumada is following , patient may qualify for inpatient.    Expected Discharge Plan: Home w Home Health Services Barriers to Discharge: Continued Medical Work up  Expected Discharge Plan and Services In-house Referral: NA Discharge Planning Services: CM Consult Post Acute Care Choice: NA Living arrangements for the past 2 months: Single Family Home                 DME Arranged: N/A DME Agency: NA       HH Arranged: NA HH Agency: NA         Social Determinants of Health (SDOH) Interventions SDOH Screenings   Food Insecurity: No Food Insecurity (11/07/2023)  Housing: Low Risk  (11/07/2023)  Transportation Needs: No Transportation Needs (11/07/2023)  Utilities: Not At Risk (11/07/2023)  Alcohol Screen: Low Risk  (05/22/2023)  Depression (PHQ2-9): Low Risk  (10/23/2023)  Financial Resource Strain: Low Risk  (08/22/2023)  Physical Activity: Inactive (08/22/2023)  Social Connections: Unknown (11/07/2023)  Stress: No Stress Concern Present (08/22/2023)  Tobacco Use: Medium Risk (11/07/2023)  Health Literacy: Adequate Health Literacy (05/22/2023)    Readmission Risk Interventions    11/08/2023    3:30 PM  Readmission Risk Prevention Plan  Transportation Screening Complete  PCP or Specialist Appt within 3-5 Days Complete  HRI or Home Care Consult Complete  Social Work Consult for Recovery Care Planning/Counseling Complete  Medication Review Oceanographer) Referral to Pharmacy

## 2023-11-19 DIAGNOSIS — R180 Malignant ascites: Secondary | ICD-10-CM | POA: Diagnosis not present

## 2023-11-19 DIAGNOSIS — Z7189 Other specified counseling: Secondary | ICD-10-CM

## 2023-11-19 DIAGNOSIS — R4589 Other symptoms and signs involving emotional state: Secondary | ICD-10-CM

## 2023-11-19 DIAGNOSIS — Z515 Encounter for palliative care: Secondary | ICD-10-CM

## 2023-11-19 DIAGNOSIS — R627 Adult failure to thrive: Secondary | ICD-10-CM

## 2023-11-19 DIAGNOSIS — G9341 Metabolic encephalopathy: Secondary | ICD-10-CM

## 2023-11-19 DIAGNOSIS — E118 Type 2 diabetes mellitus with unspecified complications: Secondary | ICD-10-CM

## 2023-11-19 DIAGNOSIS — C50912 Malignant neoplasm of unspecified site of left female breast: Secondary | ICD-10-CM | POA: Diagnosis not present

## 2023-11-19 LAB — GLUCOSE, CAPILLARY
Glucose-Capillary: 95 mg/dL (ref 70–99)
Glucose-Capillary: 128 mg/dL — ABNORMAL HIGH (ref 70–99)
Glucose-Capillary: 126 mg/dL — ABNORMAL HIGH (ref 70–99)

## 2023-11-19 MED FILL — Fosaprepitant Dimeglumine For IV Infusion 150 MG (Base Eq): INTRAVENOUS | Qty: 5 | Status: AC

## 2023-11-19 NOTE — Progress Notes (Signed)
 Heather Rogers [address] [facility] Liaison Note  Received request from PMT to readdress hospice services at home after discharge. Spoke with daughter to review education related to hospice philosophy, services, and team approach to care. Patient/family verbalized understanding of information given. Per discussion, the plan is for discharge home as soon as DME can be delivered.    DME needs discussed. Patient has the a walker, bedside commode, and shower chair in the home. Patient/family requests the following equipment for delivery: hospital bed and overbed table. The address has been verified and is correct in the chart. Heather Rogers is the family contact to arrange time of equipment delivery. Heather Rogers reports that she will need time to prepare a space for the bed.   Please send signed and completed DNR home with patient/family. Please provide prescriptions at discharge as needed to ensure ongoing symptom management.   [facility] information and contact numbers given to Heather Rogers. Above information shared with Heather Rogers, Transitions of Care Manager. Please call with any questions or concerns.   Thank you for the opportunity to participate in this patient's care.   Madelene Schanz BSN, Charity fundraiser, OCN [employer] [phone]

## 2023-11-19 NOTE — Progress Notes (Signed)
 PROGRESS NOTE    Heather Rogers  BJY:[id number] DOB: [birth year] DOA: 11/07/2023 PCP: Joaquin Mulberry, MD   Brief Narrative:  78 y.o. female with medical history significant for hypertension, metastatic breast cancer, diabetes, CKD stage IIIb and recently hospitalized in March 2025 with C. difficile colitis presented to the hospital with worsening symptomatic abdominal ascites despite taking diuretics at home.  Workup in the ED showed moderate abdominal ascites.  She was admitted for the same.  She underwent ultrasound-guided paracentesis and removal of 1.3 L of yellow fluid on 11/08/2023 by IR.  Oncology following.  Hospitalization complicated by possible aspiration pneumonia requiring IV Unasyn  along with altered mental status.  Palliative care consulted for goals of care discussion.  Subsequently, family decided to pursue hospice on 11/15/2023.  Assessment & Plan:   Abdominal ascites Metastatic breast cancer Goals of care Failure to thrive/poor oral intake Hypercalcemia Acute kidney injury Probable aspiration pneumonia/pneumonitis Acute metabolic encephalopathy/altered mental status Iron deficiency anemia Hypertension Diabetes mellitus type 2 Physical deconditioning  Plan - Because of worsening overall general condition and recommendations of hospice from oncology, palliative care was consulted. Subsequently, family decided to pursue hospice on 11/15/2023.  Palliative care following.  Overall condition is worsening, hospice to decide if she is ready for residential hospice yet.  DVT prophylaxis: Lovenox  Code Status: DNR Family Communication: Daughter at bedside on 11/13/2023. Disposition Plan: Status is: Inpatient Remains inpatient appropriate because: Of severity of illness.  Consultants: Oncology.  palliative care  Procedures: As above  Antimicrobials:  Anti-infectives (From admission, onward)    Start     Dose/Rate Route Frequency Ordered Stop   11/10/23 1130   Ampicillin -Sulbactam (UNASYN ) 3 g in sodium chloride  0.9 % 100 mL IVPB  Status:  Discontinued        3 g 200 mL/hr over 30 Minutes Intravenous Every 8 hours 11/10/23 1034 11/17/23 1127   11/08/23 1400  cefTRIAXone  (ROCEPHIN ) 2 g in sodium chloride  0.9 % 100 mL IVPB  Status:  Discontinued        2 g 200 mL/hr over 30 Minutes Intravenous Every 24 hours 11/08/23 1258 11/10/23 1030        Subjective: Patient seen and examined at bedside.  Poor historian.  Continues to have intermittent fevers. Objective: Vitals:   11/17/23 1810 11/17/23 2122 11/18/23 0622 11/19/23 0449  BP:   (!) 113/48 (!) 117/52  Pulse:   71 81  Resp:   18 16  Temp: (!) 101.2 F (38.4 C) 99.1 F (37.3 C) (!) 97.5 F (36.4 C) 100 F (37.8 C)  TempSrc:  Oral Oral Oral  SpO2:   100% 98%  Weight:      Height:        Intake/Output Summary (Last 24 hours) at 11/19/2023 0825 Last data filed at 11/19/2023 0528 Gross per 24 hour  Intake 480 ml  Output 600 ml  Net -120 ml   Filed Weights   11/07/23 0956 11/07/23 1704  Weight: 70.8 kg 71 kg    Examination:  General: Looks chronically ill and deconditioned.  In no distress.  Data Reviewed: I have personally reviewed following labs and imaging studies  CBC: Recent Labs  Lab 11/13/23 0541 11/14/23 1037 11/15/23 0548 11/16/23 0755  WBC 11.9* 10.9* 12.4* 16.4*  NEUTROABS  --  7.4 8.8* 11.7*  HGB 7.6* 8.6* 8.1* 8.2*  HCT 23.5* 27.6* 27.0* 25.7*  MCV 79.1* 81.4 82.8 78.4*  PLT 209 229 226 223   Basic Metabolic Panel: Recent Labs  Lab  11/13/23 0541 11/14/23 0653 11/15/23 0548 11/16/23 0755  NA 148* 145 147* 149*  K 3.6 4.8 3.9 3.8  CL 123* 119* 119* 120*  CO2 22 17* 22 23  GLUCOSE 71 142* 164* 172*  BUN 41* 33* 29* 29*  CREATININE 0.98 1.04* 1.03* 1.09*  CALCIUM  11.4* 12.4* 11.9* 11.6*  MG  --  3.1* 2.2 2.3  PHOS 2.1*  --   --   --    GFR: Estimated Creatinine Clearance: 39.3 mL/min (A) (by C-G formula based on SCr of 1.09 mg/dL (H)). Liver  Function Tests: Recent Labs  Lab 11/13/23 0541 11/15/23 0548 11/16/23 0755  AST 113* 211* 273*  ALT 61* 90* 105*  ALKPHOS 208* 240* 278*  BILITOT 0.9 1.2 1.1  PROT 5.4* 5.7* 5.9*  ALBUMIN 1.8* 1.9* 2.0*   No results for input(s): "LIPASE", "AMYLASE" in the last 168 hours. No results for input(s): "AMMONIA" in the last 168 hours.  Coagulation Profile: No results for input(s): "INR", "PROTIME" in the last 168 hours. Cardiac Enzymes: No results for input(s): "CKTOTAL", "CKMB", "CKMBINDEX", "TROPONINI" in the last 168 hours. BNP (last 3 results) No results for input(s): "PROBNP" in the last 8760 hours. HbA1C: No results for input(s): "HGBA1C" in the last 72 hours. CBG: Recent Labs  Lab 11/18/23 0806 11/18/23 1133 11/18/23 1701 11/18/23 2011 11/19/23 0755  GLUCAP 128* 173* 128* 108* 95   Lipid Profile: No results for input(s): "CHOL", "HDL", "LDLCALC", "TRIG", "CHOLHDL", "LDLDIRECT" in the last 72 hours. Thyroid  Function Tests: No results for input(s): "TSH", "T4TOTAL", "FREET4", "T3FREE", "THYROIDAB" in the last 72 hours. Anemia Panel: No results for input(s): "VITAMINB12", "FOLATE", "FERRITIN", "TIBC", "IRON", "RETICCTPCT" in the last 72 hours. Sepsis Labs: No results for input(s): "PROCALCITON", "LATICACIDVEN" in the last 168 hours.   Recent Results (from the past 240 hours)  Culture, blood (Routine X 2) w Reflex to ID Panel     Status: None   Collection Time: 11/10/23 11:19 AM   Specimen: BLOOD LEFT ARM  Result Value Ref Range Status   Specimen Description   Final    BLOOD LEFT ARM BLOOD LEFT HAND Performed at [facility], [address] [place], [place] [address]    Special Requests   Final    BOTTLES DRAWN AEROBIC ONLY Blood Culture results may not be optimal due to an inadequate volume of blood received in culture bottles Performed at [facility], [address] [place], [place] [address]    Culture   Final    NO  GROWTH 5 DAYS Performed at [facility], [address], [place], [place] [address]    Report Status 11/15/2023 FINAL  Final  Culture, blood (Routine X 2) w Reflex to ID Panel     Status: None   Collection Time: 11/10/23 11:19 AM   Specimen: BLOOD  Result Value Ref Range Status   Specimen Description   Final    BLOOD BLOOD LEFT HAND Performed at [facility], [address], [place], [place] [address]    Special Requests   Final    BOTTLES DRAWN AEROBIC ONLY Blood Culture results may not be optimal due to an inadequate volume of blood received in culture bottles Performed at [facility], [address] [place], [place] [address]    Culture   Final    NO GROWTH 5 DAYS Performed at [facility], [address], [place], [place] [address]    Report Status  11/15/2023 FINAL  Final         Radiology Studies: No results found.       Scheduled Meds:  amLODipine   10 mg Oral Daily   aspirin  EC  81 mg Oral Daily   carvedilol   25 mg Oral BID WC   Chlorhexidine  Gluconate Cloth  6 each Topical Daily   enoxaparin  (LOVENOX ) injection  40 mg Subcutaneous Q24H   feeding supplement  237 mL Oral BID BM   ferrous sulfate   325 mg Oral Q breakfast   insulin  aspart  0-15 Units Subcutaneous TID WC   insulin  aspart  0-5 Units Subcutaneous QHS   multivitamin with minerals  1 tablet Oral Daily   mouth rinse  15 mL Mouth Rinse 4 times per day   Continuous Infusions:          Audria Leather, MD [facility] 11/19/2023, 8:25 AM

## 2023-11-19 NOTE — Progress Notes (Signed)
 Daily Progress Note   Patient Name: Heather Rogers       Date: 11/19/2023 DOB: 03-Jul-1946  Age: 78 y.o. MRN#: [id number] Attending Physician: Audria Leather, MD Primary Care Physician: Joaquin Mulberry, MD Admit Date: 11/07/2023 Length of Stay: 12 days  Reason for Consultation/Follow-up: Establishing goals of care  Subjective:   CC: Patient denying any symptoms of concern at this time.  Following up regarding complex medical decision making.  Subjective:  Reviewed EMR prior to presenting to bedside.  As per documentation, has been requested that [facility] continue reviewing patient, possible [facility] placement. Patient continues to take oral medications.  Presented to bedside to see patient.  Patient lying comfortably in bed when seen.  Patient denied any symptoms of concern when seen.  Patient's daughter, Heather, present at bedside.  Introduced myself as a member of the palliative medicine team.  Discussed care planning with daughter.  Daughter noted frustration over the fact that patient was not being assisted with meals.  Empathized with situation and noted would inform RN of concern.  Currently daughter is helping patient eat.  Patient eating few bites of food and drinking full cup of grape juice at this time.  Discussed daughter's hopes for care moving forward.  Daughter acknowledged that they had discussed with [facility] liaison that while patient is eating, would not be appropriate for inpatient hospice.  Acknowledged this as well.  Daughter inquired about what other plan would be moving forward.  Discussed other avenues for hospice support such as home with hospice support.  Daughter noted that she has been talking to other family members who state that they could potentially assist with patient's care at home since it is just her providing her mother's support.  With this, daughter would consider patient going home with hospice support.  Noted would inform [facility] liaison to follow-up with daughter  regarding this. Spent time providing emotional support via active listening.  All questions answered at that time.  Noted palliative medicine team will continue to follow along with patient's medical journey.  Updated IDT including hospitalist, RN, and [facility] liaison regarding discussion with patient's daughter. Objective:   Vital Signs:  BP (!) 117/52 (BP Location: Right Arm)   Pulse 81   Temp 100 F (37.8 C) (Oral)   Resp 16   Ht 5\' 2"  (1.575 m)   Wt 71 kg   SpO2 98%   BMI 28.63 kg/m   Physical Exam: General: NAD, awake, chronically ill-appearing Cardiovascular: RRR Neuro: Awake, will answer short questions with short answers Imaging: I personally reviewed recent imaging.   Assessment & Plan:   Assessment: Patient is a 78 year old female with a past medical history of metastatic breast cancer with recurrent ascites, diabetes mellitus, hypertension, CKD, and recent hospitalization in 10/17/2023 for C. difficile colitis who was admitted on 11/07/2023 for worsening abdominal ascites despite taking diuretics at home.  During hospitalization patient received management for abdominal ascites, failure to thrive, hypercalcemia, AKI, and probable aspiration pneumonia.  Due to patient's worsening medical status, family decided to transition to comfort focused care on 11/15/2023.  Palliative medicine team consulted to assist with complex medical decision making.  Recommendations/Plan: # Complex medical decision making/goals of care:  - Patient unable to engage in complex medical decision making at time of visit today.  Patient would engage by answering short questions with short answers.  -Spoke with patient's daughter, Heather Rogers, at bedside as detailed above in HPI family had requested patient be rereviewed for [facility]  ad[facility].  Based on current examination, patient is not appropriate for inpatient hospice admission.  Discussed with daughter other avenues of hospice support.   Daughter willing to discuss with [facility] liaison going home with hospice.  TOC and [facility] liaison assisting with discharge planning  -  Code Status: Do not attempt resuscitation (DNR) - Comfort care  # Symptom management: Patient is receiving these palliative interventions for symptom management with an intent to improve quality of life.   - Patient denied any symptoms of concern when seen today.  # Psychosocial Support:  - Daughter- Heather  # Discharge Planning: Considering home with hospice support  Discussed with: Patient, patient's daughter at bedside, hospitalist, RN, [facility] liaison  Thank you for allowing the palliative care team to participate in the care Blaine Bump.  Barnett Libel, DO Palliative Care Provider PMT # [phone]  If patient remains symptomatic despite maximum doses, please call PMT at [phone] between 0700 and 1900. Outside of these hours, please call attending, as PMT does not have night coverage.  Personally spent 35 minutes in patient care including extensive chart review (labs, imaging, progress/consult notes, vital signs), medically appropraite exam, discussed with treatment team, education to patient, family, and staff, documenting clinical information, medication review and management, coordination of care, and available advanced directive documents.

## 2023-11-19 NOTE — Progress Notes (Signed)
 [facility] Liaison Note  11/19/2023  [name] 27-Aug-1945 [id number]  Location: RN Hospital Liaison screened the patient remotely at [facility].  Insurance: [facility] HMO   Heather Rogers is a 78 y.o. female who is a Primary Care Patient of Joaquin Mulberry, MD  [facility]. The patient was screened for readmission hospitalization with noted high risk score for unplanned readmission risk with 2 IP in 6 months.  The patient was assessed for potential Care Management service needs for post hospital transition for care coordination. Review of patient's electronic medical record reveals patient was admitted with Malignant Ascites. Pt will discharged home with hospice or possible the inpt hospice facility. No VBCI services anticipated at this time. Hospice services will continue to address any further needs for this pt.   VBCI Care Management/Population Health does not replace or interfere with any arrangements made by the Inpatient Transition of Care team.   For questions contact:   Lilla Reichert, RN, BSN Hospital Liaison [place]   [facility], Population Health Office Hours MTWF  8:00 am-6:00 pm Direct Dial: [phone] mobile [email or web address]

## 2023-11-19 NOTE — Plan of Care (Signed)

## 2023-11-20 ENCOUNTER — Inpatient Hospital Stay

## 2023-11-20 ENCOUNTER — Inpatient Hospital Stay: Admitting: Hematology and Oncology

## 2023-11-20 DIAGNOSIS — R180 Malignant ascites: Secondary | ICD-10-CM | POA: Diagnosis not present

## 2023-11-20 DIAGNOSIS — Z7189 Other specified counseling: Secondary | ICD-10-CM | POA: Diagnosis not present

## 2023-11-20 DIAGNOSIS — C50912 Malignant neoplasm of unspecified site of left female breast: Secondary | ICD-10-CM | POA: Diagnosis not present

## 2023-11-20 DIAGNOSIS — Z515 Encounter for palliative care: Secondary | ICD-10-CM | POA: Diagnosis not present

## 2023-11-20 DIAGNOSIS — R4589 Other symptoms and signs involving emotional state: Secondary | ICD-10-CM | POA: Diagnosis not present

## 2023-11-20 NOTE — Plan of Care (Signed)

## 2023-11-20 NOTE — Progress Notes (Signed)
   CC:  Chief Complaint  Patient presents with   Shortness of Breath     Subjective:  Patient reports no concerns at this time.  Objective:  BP (!) 116/53   Pulse 67   Temp 100 F (37.8 C) (Oral)   Resp 16   Ht 5\' 2"  (1.575 m)   Wt 71 kg   SpO2 98%   BMI 28.63 kg/m   General exam: Appears calm and comfortable Respiratory system: Clear to auscultation. Respiratory effort normal. Cardiovascular system: S1 & S2 heard, RRR. Gastrointestinal system: Abdomen is nondistended, soft and nontender. Normal bowel sounds heard. Central nervous system: Alert Psychiatry: Judgement and insight appear normal. Mood & affect appropriate.   Assessment/Plan:  Principal Problem:   Malignant ascites Active Problems:   Palliative care encounter   Goals of care, counseling/discussion   Need for emotional support   Hypercalcemia   Counseling and coordination of care  Patient seen and evaluated by medical oncology.  Recommendation for transition to comfort measures and hospice care.  Initial plan was to discharge to hospice facility, however patient does not qualify at this time.  Plan now for home with hospice.  Aneita Keens, MD [facility] 11/20/2023, 6:33 PM

## 2023-11-20 NOTE — Progress Notes (Signed)
 Daily Progress Note   Patient Name: Heather Rogers       Date: 11/20/2023 DOB: 07-20-1946  Age: 78 y.o. MRN#: [id number] Attending Physician: Verlyn Goad, MD Primary Care Physician: Joaquin Mulberry, MD Admit Date: 11/07/2023 Length of Stay: 13 days  Reason for Consultation/Follow-up: Establishing goals of care  Subjective:   CC: Patient denying any symptoms of concern at this time.  Following up regarding complex medical decision making.  Subjective:  EMR prior to presenting to bedside.  Also discussed care with hospitalist to coordinate care.  [facility] liaison informed that daughter requested delivery of DME tomorrow morning.  Likely plan for discharge tomorrow.  Presented to bedside to see patient.  Patient's daughters friend, Heather Rogers, present at bedside.  Introduced myself as a member of the palliative medicine team.  Heather Rogers noted that patient's daughter, Heather Rogers, was at home getting the house prepared for patient to get hospice equipment delivered. Patient laying comfortably in bed.  Patient noted that she was feeling all right and did not have any symptom concerns at this time.  Patient noted that she did enjoy her breakfast this morning.  Spent time providing support via active listening.  Noted palliative medicine team continue to follow along with patient's medical journey.  All questions answered at that time.  Thanked patient for allow me to visit with her today.  Objective:   Vital Signs:  BP (!) 117/52 (BP Location: Right Arm)   Pulse 81   Temp 100 F (37.8 C) (Oral)   Resp 16   Ht 5\' 2"  (1.575 m)   Wt 71 kg   SpO2 98%   BMI 28.63 kg/m   Physical Exam: General: NAD, awake, chronically ill-appearing Cardiovascular: RRR Neuro: Awake, more interactive today  Imaging: I personally reviewed recent imaging.   Assessment & Plan:   Assessment: Patient is a 78 year old female with a past medical history of metastatic breast cancer with recurrent ascites, diabetes  mellitus, hypertension, CKD, and recent hospitalization in 10/17/2023 for C. difficile colitis who was admitted on 11/07/2023 for worsening abdominal ascites despite taking diuretics at home.  During hospitalization patient received management for abdominal ascites, failure to thrive, hypercalcemia, AKI, and probable aspiration pneumonia.  Due to patient's worsening medical status, family decided to transition to comfort focused care on 11/15/2023.  Palliative medicine team consulted to assist with complex medical decision making.  Recommendations/Plan: # Complex medical decision making/goals of care:  - Patient unable to engage in complex medical decision making at time of visit today.  Patient would engage by answering short questions with short answers.  - Again based on current examination, patient is not appropriate for inpatient hospice admission.  Planning for patient to go home with [facility] hospice hopefully for 11/21/23.  TOC assisting with coordination of care.  -  Code Status: Do not attempt resuscitation (DNR) - Comfort care  # Symptom management: Patient is receiving these palliative interventions for symptom management with an intent to improve quality of life.   - Patient denied any symptoms of concern when seen today.  # Psychosocial Support:  - Daughter- [name]  # Discharge Planning: Home with [facility] hospice when able  Discussed with: Patient, hospitalist, RN, [facility] liaison  Thank you for allowing the palliative care team to participate in the care Blaine Bump.  Barnett Libel, DO Palliative Care Provider PMT # [phone]  If patient remains symptomatic despite maximum doses, please call PMT at [phone] between 0700 and 1900. Outside of these hours, please call  attending, as PMT does not have night coverage.

## 2023-11-20 NOTE — Discharge Summary (Incomplete)
 Physician Discharge Summary   Patient: Heather Rogers MRN: [id number] DOB: [birth year]  Admit date:     11/07/2023  Discharge date: 11/21/23  Discharge Physician: Aneita Keens, MD   PCP: Joaquin Mulberry, MD   Recommendations at discharge:  ***  Discharge Diagnoses: Principal Problem:   Malignant ascites Active Problems:   Breast cancer metastasized to liver, left [facility])   Palliative care encounter   Goals of care, counseling/discussion   Need for emotional support   Hypercalcemia   Counseling and coordination of care  Resolved Problems:   * No resolved hospital problems. *  Hospital Course: No notes on file   Consultants: *** Procedures performed: ***  Disposition: {Plan; Disposition:26390} Diet recommendation:  {Diet_Plan:26776} DISCHARGE MEDICATION: Allergies as of 11/21/2023       Reactions   Sulfa Antibiotics Itching        Medication List     STOP taking these medications    allopurinol  100 MG tablet Commonly known as: ZYLOPRIM    aspirin  EC 81 MG tablet   atorvastatin  80 MG tablet Commonly known as: LIPITOR    cholecalciferol 25 MCG (1000 UNIT) tablet Commonly known as: VITAMIN D3   ferrous sulfate  325 (65 FE) MG EC tablet   furosemide  20 MG tablet Commonly known as: Lasix    gabapentin  100 MG capsule Commonly known as: NEURONTIN    metFORMIN  500 MG tablet Commonly known as: GLUCOPHAGE    multivitamin with minerals Tabs tablet   polyethylene glycol 17 g packet Commonly known as: MIRALAX  / GLYCOLAX        TAKE these medications    amLODipine  10 MG tablet Commonly known as: NORVASC  Take 1 tablet (10 mg total) by mouth daily.   carvedilol  25 MG tablet Commonly known as: COREG  Take 1 tablet (25 mg total) by mouth 2 (two) times daily with a meal.   ondansetron  8 MG tablet Commonly known as: ZOFRAN  Take 1 tablet (8 mg total) by mouth every 8 (eight) hours as needed.        Discharge Exam: Filed Weights   11/07/23 0956  11/07/23 1704  Weight: 70.8 kg 71 kg   ***  Condition at discharge: {DC Condition:26389}  The results of significant diagnostics from this hospitalization (including imaging, microbiology, ancillary and laboratory) are listed below for reference.   Imaging Studies: US  Paracentesis Result Date: 11/14/2023 INDICATION: Patient with history of primary breast cancer with metastasis, recurrent malignant ascites. IR consulted for therapeutic paracentesis. EXAM: ULTRASOUND GUIDED THERAPEUTIC PARACENTESIS MEDICATIONS: 10 mL 1% lidocaine  COMPLICATIONS: None immediate. PROCEDURE: Informed written consent was obtained from the patient's daughter after a discussion of the risks, benefits and alternatives to treatment. A timeout was performed prior to the initiation of the procedure. Initial ultrasound scanning demonstrates a moderate amount of ascites within the right lower abdominal quadrant. The right lower abdomen was prepped and draped in the usual sterile fashion. 1% lidocaine  was used for local anesthesia. Following this, a 19 gauge, 10-cm, Yueh catheter was introduced. An ultrasound image was saved for documentation purposes. The paracentesis was performed. The catheter was removed and a dressing was applied. The patient tolerated the procedure well without immediate post procedural complication. FINDINGS: A total of approximately 1.4 liters of yellow fluid was removed. IMPRESSION: Successful ultrasound-guided paracentesis yielding 1.4 liters of peritoneal fluid. Performed by: Wyatt Pommier, PA-C Electronically Signed   By: Nicoletta Barrier M.D.   On: 11/14/2023 17:30   CT HEAD WO CONTRAST ( ) Result Date: 11/12/2023 CLINICAL DATA:  Provided history: Mental status change, unknown  cause. Additional history provided: Metastatic breast cancer, fever, confusion. EXAM: CT HEAD WITHOUT CONTRAST TECHNIQUE: Contiguous axial images were obtained from the base of the skull through the vertex without intravenous contrast.  RADIATION DOSE REDUCTION: This exam was performed according to the departmental dose-optimization program which includes automated exposure control, adjustment of the mA and/or kV according to patient size and/or use of iterative reconstruction technique. COMPARISON:  Head CT 09/28/2023. Report from MRI brain and MRA head 05/15/2018. FINDINGS: Brain: Generalized cerebral atrophy. Chronic cortical/subcortical left PCA territory infarct again demonstrated within the left occipital lobe. Background mild patchy and ill-defined hypoattenuation within the cerebral white matter, nonspecific but compatible with chronic small vessel ischemic disease There is no acute intracranial hemorrhage. No acute demarcated cortical infarct. No extra-axial fluid collection. No evidence of an intracranial mass. No midline shift. Vascular: No hyperdense vessel.  Atherosclerotic calcifications. Skull: No calvarial fracture or aggressive osseous lesion. Sinuses/Orbits: No mass or acute finding within the imaged orbits. Trace mucosal thickening within left ethmoid air cells. IMPRESSION: 1. No evidence of an acute intracranial abnormality. 2. Known chronic left PCA territory infarct. 3. Background parenchymal atrophy and chronic small vessel ischemic disease. Electronically Signed   By: Bascom Lily D.O.   On: 11/12/2023 09:24   DG CHEST PORT 1 VIEW Result Date: 11/10/2023 CLINICAL DATA:  Aspiration pneumonia EXAM: PORTABLE CHEST 1 VIEW COMPARISON:  None Available. FINDINGS: Similar appearance of left retrocardiac opacity. Minimal blunting of the right costophrenic angle. Heart mediastinum are unchanged. IMPRESSION: 1. No significant interval change. Electronically Signed   By: Reagan Camera M.D.   On: 11/10/2023 12:55   US  Paracentesis Result Date: 11/08/2023 INDICATION: Patient with history of primary breast cancer with metastasis to the liver, malignant ascites. IR consulted for diagnostic and therapeutic paracentesis. EXAM: ULTRASOUND  GUIDED DIAGNOSTIC AND THERAPEUTIC PARACENTESIS MEDICATIONS: 10 mL 1% lidocaine  COMPLICATIONS: None immediate. PROCEDURE: Informed written consent was obtained from the patient after a discussion of the risks, benefits and alternatives to treatment. A timeout was performed prior to the initiation of the procedure. Initial ultrasound scanning demonstrates a moderate amount of ascites within the right lower abdominal quadrant. The right lower abdomen was prepped and draped in the usual sterile fashion. 1% lidocaine  was used for local anesthesia. Following this, a 19 gauge, 7-cm, Yueh catheter was introduced. An ultrasound image was saved for documentation purposes. The paracentesis was performed. The catheter was removed and a dressing was applied. The patient tolerated the procedure well without immediate post procedural complication. FINDINGS: A total of approximately 1.3 liters of yellow fluid was removed. Samples were sent to the laboratory as requested by the clinical team. IMPRESSION: Successful ultrasound-guided paracentesis yielding 1.3 liters of peritoneal fluid. Performed by: Wyatt Pommier, PA-C Electronically Signed   By: Erica Hau M.D.   On: 11/08/2023 14:53   CT ABDOMEN PELVIS W CONTRAST Result Date: 11/07/2023 CLINICAL DATA:  Abdominal pain, acute, nonlocalized; Pulmonary embolism (PE) suspected, high prob EXAM: CT ANGIOGRAPHY CHEST CT ABDOMEN AND PELVIS WITH CONTRAST TECHNIQUE: Multidetector CT imaging of the chest was performed using the standard protocol during bolus administration of intravenous contrast. Multiplanar CT image reconstructions and MIPs were obtained to evaluate the vascular anatomy. Multidetector CT imaging of the abdomen and pelvis was performed using the standard protocol during bolus administration of intravenous contrast. RADIATION DOSE REDUCTION: This exam was performed according to the departmental dose-optimization program which includes automated exposure control,  adjustment of the mA and/or kV according to patient size and/or use of  iterative reconstruction technique. CONTRAST:  <See Chart> OMNIPAQUE  IOHEXOL  300 MG/ML SOLN, OMNIPAQUE  IOHEXOL  350 MG/ML SOLN COMPARISON:  CT scan abdomen and pelvis from 09/27/2023 and CT chest from 09/29/2023. FINDINGS: CTA CHEST FINDINGS Cardiovascular: No evidence of embolism to the proximal subsegmental pulmonary artery level. Normal cardiac size. No pericardial effusion. No aortic aneurysm. There are coronary artery calcifications, in keeping with coronary artery disease. There are also mild peripheral atherosclerotic vascular calcifications of thoracic aorta and its major branches. Mediastinum/Nodes: Visualized thyroid  gland appears grossly unremarkable. No solid / cystic mediastinal masses. The esophagus is nondistended precluding optimal assessment. There is mild circumferential thickening of the lower thoracic esophagus, which is most likely seen in the settings of chronic gastroesophageal reflux disease versus esophagitis. No axillary, mediastinal or hilar lymphadenopathy by size criteria. Lungs/Pleura: The central tracheo-bronchial tree is patent. There are patchy areas of linear, plate-like atelectasis and/or scarring throughout bilateral lungs. There is trace bilateral pleural effusion with associated compressive atelectatic changes in the bilateral lower lobes. There is a heterogeneous peripherally hyperattenuating and centrally hypoattenuating approximately 1.1 x 2.0 cm pleural based nodule in the left lung lower lobe, indeterminate, but unchanged since the prior study from 09/20/2023. Attention on follow-up examination is recommended. Bilateral lungs are otherwise clear. Musculoskeletal: Redemonstration of central left breast mass as well as asymmetric left skin breast skin thickening, grossly similar to the prior study. The visualized soft tissues of the chest wall are otherwise grossly unremarkable. Redemonstration of  heterogeneous mixed sclerotic/lytic appearance of the sternal body along with pathological fracture, similar to the prior study. There are mild multilevel degenerative changes in the visualized spine. Review of the MIP images confirms the above findings. CT ABDOMEN and PELVIS FINDINGS Hepatobiliary: The liver is normal in size. Non-cirrhotic configuration. Redemonstration of extensive ill-defined heterogeneous hypoattenuating liver masses occupying approximately 50% of the total liver parenchyma, compatible with metastasis. Comparison with prior study is limited due to lack of intravenous contrast however, overall, there is no significant interval change. The hepatic vein and portal vein tributaries appear compressed however, no discrete thrombosis noted. No intrahepatic or extrahepatic bile duct dilation. Contracted gallbladder. No calcified gallstones. Normal gallbladder wall thickness. No pericholecystic inflammatory changes. Pancreas: Unremarkable. No pancreatic ductal dilatation or surrounding inflammatory changes. Spleen: Within normal limits. No focal lesion. Adrenals/Urinary Tract: Adrenal glands are unremarkable. No suspicious renal mass. Redemonstration of multiple simple cysts scattered throughout bilateral kidneys. No hydroureteronephrosis or nephroureterolithiasis. Urinary bladder is under distended, precluding optimal assessment. However, no large mass or stones identified. No perivesical fat stranding. Stomach/Bowel: No disproportionate dilation of the small or large bowel loops. No evidence of abnormal bowel wall thickening or inflammatory changes. The appendix is unremarkable. Vascular/Lymphatic: There is small-to-moderate ascites, increased since the prior study. No pneumoperitoneum. No abdominal or pelvic lymphadenopathy, by size criteria. No aneurysmal dilation of the major abdominal arteries. There are moderate peripheral atherosclerotic vascular calcifications of the aorta and its major  branches. Reproductive: The uterus is surgically absent. No large adnexal mass. There are heterogeneous soft tissue attenuation areas along the bilateral pelvic sidewalls, which may represent residual ovarian tissue versus peritoneal carcinomatosis. No significant interval change. Other: There is a tiny fat containing umbilical hernia. There is small to moderate anasarca. Musculoskeletal: No suspicious osseous lesions. There are mild multilevel degenerative changes in the visualized spine. Review of the MIP images confirms the above findings. IMPRESSION: 1. No embolism to the proximal subsegmental pulmonary artery level. 2. There is trace bilateral pleural effusion with associated compressive atelectatic changes  in the bilateral lower lobes. 3. Redemonstration of extensive liver metastases, grossly similar to the prior study. 4. There is small-to-moderate ascites, increased since the prior study. 5. Redemonstration of central left breast mass and asymmetric left breast skin thickening, grossly similar to the prior study. 6. Redemonstration of heterogeneous mixed sclerotic/lytic appearance of the sternal body along with pathological fracture, similar to the prior study. 7. Multiple other nonacute observations, as described above. Aortic Atherosclerosis (ICD10-I70.0). Electronically Signed   By: Beula Brunswick M.D.   On: 11/07/2023 13:15   CT Angio Chest PE W/Cm &/Or Wo Cm Result Date: 11/07/2023 CLINICAL DATA:  Abdominal pain, acute, nonlocalized; Pulmonary embolism (PE) suspected, high prob EXAM: CT ANGIOGRAPHY CHEST CT ABDOMEN AND PELVIS WITH CONTRAST TECHNIQUE: Multidetector CT imaging of the chest was performed using the standard protocol during bolus administration of intravenous contrast. Multiplanar CT image reconstructions and MIPs were obtained to evaluate the vascular anatomy. Multidetector CT imaging of the abdomen and pelvis was performed using the standard protocol during bolus administration of  intravenous contrast. RADIATION DOSE REDUCTION: This exam was performed according to the departmental dose-optimization program which includes automated exposure control, adjustment of the mA and/or kV according to patient size and/or use of iterative reconstruction technique. CONTRAST:  <See Chart> OMNIPAQUE  IOHEXOL  300 MG/ML SOLN, OMNIPAQUE  IOHEXOL  350 MG/ML SOLN COMPARISON:  CT scan abdomen and pelvis from 09/27/2023 and CT chest from 09/29/2023. FINDINGS: CTA CHEST FINDINGS Cardiovascular: No evidence of embolism to the proximal subsegmental pulmonary artery level. Normal cardiac size. No pericardial effusion. No aortic aneurysm. There are coronary artery calcifications, in keeping with coronary artery disease. There are also mild peripheral atherosclerotic vascular calcifications of thoracic aorta and its major branches. Mediastinum/Nodes: Visualized thyroid  gland appears grossly unremarkable. No solid / cystic mediastinal masses. The esophagus is nondistended precluding optimal assessment. There is mild circumferential thickening of the lower thoracic esophagus, which is most likely seen in the settings of chronic gastroesophageal reflux disease versus esophagitis. No axillary, mediastinal or hilar lymphadenopathy by size criteria. Lungs/Pleura: The central tracheo-bronchial tree is patent. There are patchy areas of linear, plate-like atelectasis and/or scarring throughout bilateral lungs. There is trace bilateral pleural effusion with associated compressive atelectatic changes in the bilateral lower lobes. There is a heterogeneous peripherally hyperattenuating and centrally hypoattenuating approximately 1.1 x 2.0 cm pleural based nodule in the left lung lower lobe, indeterminate, but unchanged since the prior study from 09/20/2023. Attention on follow-up examination is recommended. Bilateral lungs are otherwise clear. Musculoskeletal: Redemonstration of central left breast mass as well as asymmetric  left skin breast skin thickening, grossly similar to the prior study. The visualized soft tissues of the chest wall are otherwise grossly unremarkable. Redemonstration of heterogeneous mixed sclerotic/lytic appearance of the sternal body along with pathological fracture, similar to the prior study. There are mild multilevel degenerative changes in the visualized spine. Review of the MIP images confirms the above findings. CT ABDOMEN and PELVIS FINDINGS Hepatobiliary: The liver is normal in size. Non-cirrhotic configuration. Redemonstration of extensive ill-defined heterogeneous hypoattenuating liver masses occupying approximately 50% of the total liver parenchyma, compatible with metastasis. Comparison with prior study is limited due to lack of intravenous contrast however, overall, there is no significant interval change. The hepatic vein and portal vein tributaries appear compressed however, no discrete thrombosis noted. No intrahepatic or extrahepatic bile duct dilation. Contracted gallbladder. No calcified gallstones. Normal gallbladder wall thickness. No pericholecystic inflammatory changes. Pancreas: Unremarkable. No pancreatic ductal dilatation or surrounding inflammatory changes. Spleen: Within normal  limits. No focal lesion. Adrenals/Urinary Tract: Adrenal glands are unremarkable. No suspicious renal mass. Redemonstration of multiple simple cysts scattered throughout bilateral kidneys. No hydroureteronephrosis or nephroureterolithiasis. Urinary bladder is under distended, precluding optimal assessment. However, no large mass or stones identified. No perivesical fat stranding. Stomach/Bowel: No disproportionate dilation of the small or large bowel loops. No evidence of abnormal bowel wall thickening or inflammatory changes. The appendix is unremarkable. Vascular/Lymphatic: There is small-to-moderate ascites, increased since the prior study. No pneumoperitoneum. No abdominal or pelvic lymphadenopathy, by size  criteria. No aneurysmal dilation of the major abdominal arteries. There are moderate peripheral atherosclerotic vascular calcifications of the aorta and its major branches. Reproductive: The uterus is surgically absent. No large adnexal mass. There are heterogeneous soft tissue attenuation areas along the bilateral pelvic sidewalls, which may represent residual ovarian tissue versus peritoneal carcinomatosis. No significant interval change. Other: There is a tiny fat containing umbilical hernia. There is small to moderate anasarca. Musculoskeletal: No suspicious osseous lesions. There are mild multilevel degenerative changes in the visualized spine. Review of the MIP images confirms the above findings. IMPRESSION: 1. No embolism to the proximal subsegmental pulmonary artery level. 2. There is trace bilateral pleural effusion with associated compressive atelectatic changes in the bilateral lower lobes. 3. Redemonstration of extensive liver metastases, grossly similar to the prior study. 4. There is small-to-moderate ascites, increased since the prior study. 5. Redemonstration of central left breast mass and asymmetric left breast skin thickening, grossly similar to the prior study. 6. Redemonstration of heterogeneous mixed sclerotic/lytic appearance of the sternal body along with pathological fracture, similar to the prior study. 7. Multiple other nonacute observations, as described above. Aortic Atherosclerosis (ICD10-I70.0). Electronically Signed   By: Beula Brunswick M.D.   On: 11/07/2023 13:15   DG Chest 2 View Result Date: 11/07/2023 CLINICAL DATA:  Shortness of breath. EXAM: CHEST - 2 VIEW COMPARISON:  09/27/2023. FINDINGS: Low lung volume. Mild diffuse pulmonary vascular congestion, likely accentuated by low lung volume. No frank pulmonary edema. Redemonstration of left retrocardiac airspace opacity obscuring the left hemidiaphragm, descending thoracic aorta and blunting the left lateral costophrenic angle,  suggesting combination of left lung atelectasis and/or consolidation with pleural effusion. No significant interval change. Bilateral lung fields are otherwise clear. Right costophrenic angle is clear. Stable mildly enlarged cardio-mediastinal silhouette. Loop recorder device noted overlying the left anterior lower chest wall. No acute osseous abnormalities. The soft tissues are within normal limits. IMPRESSION: *Persistent left retrocardiac opacity, as described above. No significant interval change. Electronically Signed   By: Beula Brunswick M.D.   On: 11/07/2023 11:23    Microbiology: Results for orders placed or performed during the hospital encounter of 11/07/23  Body fluid culture w Gram Stain     Status: None   Collection Time: 11/08/23  2:52 PM   Specimen: PATH Cytology Pleural fluid  Result Value Ref Range Status   Specimen Description   Final    PLEURAL Performed at [facility], [address], [place], [place] [address]    Special Requests   Final    NONE Performed at [facility], [address], [place], [place] [address]    Gram Stain   Final    RARE WBC PRESENT,BOTH PMN AND MONONUCLEAR NO ORGANISMS SEEN    Culture   Final    NO GROWTH 3 DAYS Performed at [facility], [address] [place], [place] [address]    Report Status 11/11/2023 FINAL  Final  Culture, blood (Routine X 2) w Reflex to ID Panel     Status: None   Collection Time: 11/08/23  9:43 PM   Specimen: BLOOD LEFT HAND  Result Value Ref Range Status   Specimen Description   Final    BLOOD LEFT HAND Performed at [facility], [address] [place], [place] [address]    Special Requests   Final    BOTTLES DRAWN AEROBIC AND ANAEROBIC Blood Culture results may not be optimal due to an inadequate volume of blood received in culture bottles Performed at [facility], [address], [place], [place] [address]    Culture   Final    NO  GROWTH 5 DAYS Performed at [facility], [address], [place], [place] [address]    Report Status 11/13/2023 FINAL  Final  Culture, blood (Routine X 2) w Reflex to ID Panel     Status: None   Collection Time: 11/08/23  9:43 PM   Specimen: BLOOD LEFT HAND  Result Value Ref Range Status   Specimen Description   Final    BLOOD LEFT HAND Performed at [facility], [address], [place], [place] [address]    Special Requests   Final    BOTTLES DRAWN AEROBIC AND ANAEROBIC Blood Culture results may not be optimal due to an inadequate volume of blood received in culture bottles Performed at [facility], [address] [place], [place] [address]    Culture   Final    NO GROWTH 5 DAYS Performed at [facility], [address], [place], [place] [address]    Report Status 11/13/2023 FINAL  Final  Culture, blood (Routine X 2) w Reflex to ID Panel     Status: None   Collection Time: 11/10/23 11:19 AM   Specimen: BLOOD LEFT ARM  Result Value Ref Range Status   Specimen Description   Final    BLOOD LEFT ARM BLOOD LEFT HAND Performed at [facility], [address], [place], [place] [address]    Special Requests   Final    BOTTLES DRAWN AEROBIC ONLY Blood Culture results may not be optimal due to an inadequate volume of blood received in culture bottles Performed at [facility], [address] [place], [place] [address]    Culture   Final    NO GROWTH 5 DAYS Performed at [facility], [address], [place], [place] [address]    Report Status 11/15/2023 FINAL  Final  Culture, blood (Routine X 2) w Reflex to ID Panel     Status: None   Collection Time: 11/10/23 11:19 AM   Specimen: BLOOD  Result Value Ref Range Status   Specimen Description   Final    BLOOD BLOOD LEFT HAND Performed at [facility], [address] [place], [place] [address]    Special Requests   Final    BOTTLES  DRAWN AEROBIC ONLY Blood Culture results may not be optimal due to an inadequate volume of blood received in culture bottles Performed at [facility], [address] [place], [place] [address]    Culture   Final    NO GROWTH 5 DAYS Performed at [facility], [address] [place], [place] [address]    Report Status 11/15/2023 FINAL  Final    Labs: CBC: Recent Labs  Lab 11/14/23 1037 11/15/23 0548 11/16/23 0755  WBC  10.9* 12.4* 16.4*  NEUTROABS 7.4 8.8* 11.7*  HGB 8.6* 8.1* 8.2*  HCT 27.6* 27.0* 25.7*  MCV 81.4 82.8 78.4*  PLT 229 226 223   Basic Metabolic Panel: Recent Labs  Lab 11/15/23 0548 11/16/23 0755  NA 147* 149*  K 3.9 3.8  CL 119* 120*  CO2 22 23  GLUCOSE 164* 172*  BUN 29* 29*  CREATININE 1.03* 1.09*  CALCIUM  11.9* 11.6*  MG 2.2 2.3   Liver Function Tests: Recent Labs  Lab 11/15/23 0548 11/16/23 0755  AST 211* 273*  ALT 90* 105*  ALKPHOS 240* 278*  BILITOT 1.2 1.1  PROT 5.7* 5.9*  ALBUMIN 1.9* 2.0*   CBG: Recent Labs  Lab 11/18/23 1701 11/18/23 2011 11/19/23 0755 11/19/23 1139 11/19/23 1616  GLUCAP 128* 108* 95 128* 126*    Discharge time spent: {LESS THAN/GREATER THAN:26388} 30 minutes.  Signed: Aneita Keens, MD [facility] 11/21/2023

## 2023-11-21 DIAGNOSIS — R180 Malignant ascites: Secondary | ICD-10-CM | POA: Diagnosis not present

## 2023-11-21 NOTE — Care Management Important Message (Signed)
 Important Message  Patient Details No IM Letter given due to Comfort Care. Name: Heather Rogers MRN: [id number] Date of Birth: Nov 23, 1945   Important Message Given:  No     Curtiss Dowdy 11/21/2023, 10:07 AM

## 2023-11-21 NOTE — Progress Notes (Signed)
  Daily Progress Note   Patient Name: LIZZY HAMRE       Date: 11/21/2023 DOB: [birth year]  Age: 78 y.o. MRN#: [id number] Attending Physician: Verlyn Goad, MD Primary Care Physician: Joaquin Mulberry, MD Admit Date: 11/07/2023 Length of Stay: 14 days  Reason for Consultation/Follow-up: Establishing goals of care  Subjective:   CC: Patient looking forward to going home today.  Following up regarding complex medical decision making.  Subjective:  EMR prior to presenting to bedside.  This morning patient required as needed Zofran .  Discussed care with [facility] liaison and hospitalist prior to seeing patient.  Presented to bedside to see patient.  Patient sleeping comfortably in bed though easily awakened.  Friends present at bedside.  RN also present so discussed care for today.  DME equipment being delivered this morning.  Plan is for [facility] RN to admit patient to hospice today at 3 PM.  Planning to discharge patient once equipment is set up. Patient sleepy though looking forward to going home.  Patient denied any pain at this time.  Spent time answering questions as able.  Noted palliative medicine team to be available if needed.  Discussed care with IDT during day to coordinate care.  Objective:   Vital Signs:  BP (!) 119/58 (BP Location: Right Arm)   Pulse 75   Temp 98.9 F (37.2 C) (Oral)   Resp 18   Ht 5\' 2"  (1.575 m)   Wt 71 kg   SpO2 98%   BMI 28.63 kg/m   Physical Exam: General: NAD, pleasant, chronically ill-appearing Cardiovascular: RRR Neuro: Awake, more interactive today  Imaging: I personally reviewed recent imaging.   Assessment & Plan:   Assessment: Patient is a 78 year old female with a past medical history of metastatic breast cancer with recurrent ascites, diabetes mellitus, hypertension, CKD, and recent hospitalization in 10/17/2023 for C. difficile colitis who was admitted on 11/07/2023 for worsening abdominal ascites despite taking diuretics at home.   During hospitalization patient received management for abdominal ascites, failure to thrive, hypercalcemia, AKI, and probable aspiration pneumonia.  Due to patient's worsening medical status, family decided to transition to comfort focused care on 11/15/2023.  Palliative medicine team consulted to assist with complex medical decision making.  Recommendations/Plan: # Complex medical decision making/goals of care:  - Patient unable to engage in complex medical decision making at time of visit today.  Patient would engage by answering short questions with short answers.  - Plan is for patient to go home with [facility] hospice today.  TOC assisting and [facility] liaison with coordination of care.  -  Code Status: Do not attempt resuscitation (DNR) - Comfort care  # Psychosocial Support:  - Daughter- [name]  # Discharge Planning: Home with [facility] hospice today  Discussed with: Patient, hospitalist, RN, [facility] liaison  Thank you for allowing the palliative care team to participate in the care Blaine Bump.  Barnett Libel, DO Palliative Care Provider PMT # [phone]  If patient remains symptomatic despite maximum doses, please call PMT at [phone] between 0700 and 1900. Outside of these hours, please call attending, as PMT does not have night coverage.

## 2023-11-21 NOTE — Discharge Instructions (Addendum)
 Heather Rogers,  You were in the hospital because of your fluid build up in your belly, which was drained. You were also treated for pneumonia. You have decided on hospice at home; this has been arranged.

## 2023-11-21 NOTE — Plan of Care (Signed)

## 2023-11-21 NOTE — TOC Transition Note (Signed)
 Transition of Care [facility]) - Discharge Note   Patient Details  Name: Heather Rogers MRN: [id number] Date of Birth: April 01, 1946  [facility] CM/SW Contact:  Loreda Rodriguez, RN Phone [phone]  11/21/2023, 11:43 AM   Clinical Narrative:    Patient discharging home with hospice care. CM has verified address with daughter [name]. Transportation has been arranged per PTAR. Discharge packet is at nurses station in chart. There are no other TOC needs. TOC will sign off.    Final next level of care: Home w Hospice Care Barriers to Discharge: No Barriers Identified   Patient Goals and CMS Choice Patient states their goals for this hospitalization and ongoing recovery are:: Wants to go home with grandson   Choice offered to / list presented to : NA Reader ownership interest in [facility].provided to::  (n/a)    Discharge Placement                  Name of family member notified: [name] Patient and family notified of of transfer: 11/21/23  Discharge Plan and Services Additional resources added to the After Visit Summary for   In-house Referral: NA Discharge Planning Services: CM Consult Post Acute Care Choice: NA          DME Arranged: Other see comment (per hospice) DME Agency: Other - Comment ([facility])       HH Arranged: NA HH Agency: NA        Social Drivers of Health (SDOH) Interventions SDOH Screenings   Food Insecurity: No Food Insecurity (11/07/2023)  Housing: Low Risk  (11/07/2023)  Transportation Needs: No Transportation Needs (11/07/2023)  Utilities: Not At Risk (11/07/2023)  Alcohol Screen: Low Risk  (05/22/2023)  Depression (PHQ2-9): Low Risk  (10/23/2023)  Financial Resource Strain: Low Risk  (08/22/2023)  Physical Activity: Inactive (08/22/2023)  Social Connections: Unknown (11/07/2023)  Stress: No Stress Concern Present (08/22/2023)  Tobacco Use: Medium Risk (11/07/2023)  Health Literacy: Adequate Health  Literacy (05/22/2023)     Readmission Risk Interventions    11/08/2023    3:30 PM  Readmission Risk Prevention Plan  Transportation Screening Complete  PCP or Specialist Appt within 3-5 Days Complete  HRI or Home Care Consult Complete  Social Work Consult for Recovery Care Planning/Counseling Complete  Medication Review Oceanographer) Referral to Pharmacy

## 2023-11-22 ENCOUNTER — Telehealth: Payer: Self-pay | Admitting: *Deleted

## 2023-11-22 NOTE — Transitions of Care (Post Inpatient/ED Visit) (Signed)
   11/22/2023  Name: Heather Rogers MRN: [id number] DOB: [birth year]  Today's TOC FU Call Status: Today's TOC FU Call Status:: Successful TOC FU Call Completed TOC FU Call Complete Date: 11/22/23 Patient's Name and Date of Birth confirmed.  Transition Care Management Follow-up Telephone Call Date of Discharge: 11/21/23 Discharge Facility: [facility]) Type of Discharge: Inpatient Admission Primary Inpatient Discharge Diagnosis:: Malignant ascites How have you been since you were released from the hospital?: Same Any questions or concerns?: No  Items Reviewed: Did you receive and understand the discharge instructions provided?: Yes Medications obtained,verified, and reconciled?: No (Patient home with Hospice, Hospice is managing patient's medications) Any new allergies since your discharge?: No Dietary orders reviewed?: NA Do you have support at home?: Yes People in Home [RPT]: child(ren), adult Name of Support/Comfort Primary Source: Daughter/Heather Rogers  Medications Reviewed Today: Medications not reviewed. Hospice is managing all medications. Medications Reviewed Today   Medications were not reviewed in this encounter     Home Care and Equipment/Supplies: Were Home Health Services Ordered?: Yes Name of Home Health Agency:: [facility] Any new equipment or medical supplies ordered?: No  Functional Questionnaire: Do you need assistance with bathing/showering or dressing?: Yes (Home with Hospice) Do you need assistance with meal preparation?: Yes (Home with Hospice) Do you need assistance with eating?: Yes (Home with Hospice) Do you have difficulty maintaining continence: Yes (Home with Hospice) Do you need assistance with getting out of bed/getting out of a chair/moving?: Yes (Home with Hospice) Do you have difficulty managing or taking your medications?: Yes (Home with Hospice)  Follow up appointments reviewed: PCP Follow-up appointment confirmed?: Yes Date of PCP  follow-up appointment?: 11/27/23 Follow-up Provider: Shelvy Dickens Rogers-patient home with Hospice, will not be keeping this appointment Specialist Hospital Follow-up appointment confirmed?: NA Do you need transportation to your follow-up appointment?: No Do you understand care options if your condition(s) worsen?: Yes-patient verbalized understanding    Arna Better RN, BSN [place]  [facility] RN Care Manager [phone]

## 2023-11-24 NOTE — Hospital Course (Signed)
 Heather Rogers is a 78 y.o. female with a history of hypertension, metastatic breast cancer, diabetes mellitus type 2, CKD stage IIIb.  Patient presented secondary to abdominal distention and shortness of breath secondary to abdominal ascites in the setting of metastatic breast cancer.  Paracentesis performed. Patient seen and evaluated by medical oncology. Recommendation for transition to comfort measures and hospice care. Initial plan was to discharge to hospice facility, however patient does not qualify at this time. Plan now for home with hospice.

## 2023-11-24 NOTE — Discharge Summary (Signed)
 Physician Discharge Summary   Patient: Heather Rogers MRN: [id number] DOB: 23-Mar-1946  Admit date:     11/07/2023  Discharge date: 11/21/2023  Discharge Physician: Aneita Keens, MD   PCP: Joaquin Mulberry, MD   Recommendations at discharge:  Hospice care  Discharge Diagnoses: Principal Problem:   Malignant ascites Active Problems:   Breast cancer metastasized to liver, left [facility])   Palliative care encounter   Goals of care, counseling/discussion   Need for emotional support   Hypercalcemia   Counseling and coordination of care  Resolved Problems:   * No resolved hospital problems. *  Hospital Course: Heather Rogers is a 78 y.o. female with a history of hypertension, metastatic breast cancer, diabetes mellitus type 2, CKD stage IIIb.  Patient presented secondary to abdominal distention and shortness of breath secondary to abdominal ascites in the setting of metastatic breast cancer.  Paracentesis performed. Patient seen and evaluated by medical oncology. Recommendation for transition to comfort measures and hospice care. Initial plan was to discharge to hospice facility, however patient does not qualify at this time. Plan now for home with hospice.    Consultants: Medical oncology, palliative care, interventional radiology Procedures performed: Paracentesis  Disposition: Hospice care Diet recommendation: Regular diet   DISCHARGE MEDICATION: Allergies as of 11/21/2023       Reactions   Sulfa Antibiotics Itching        Medication List     STOP taking these medications    allopurinol  100 MG tablet Commonly known as: ZYLOPRIM    amLODipine  10 MG tablet Commonly known as: NORVASC    aspirin  EC 81 MG tablet   atorvastatin  80 MG tablet Commonly known as: LIPITOR    cholecalciferol 25 MCG (1000 UNIT) tablet Commonly known as: VITAMIN D3   ferrous sulfate  325 (65 FE) MG EC tablet   furosemide  20 MG tablet Commonly known as: Lasix    gabapentin  100 MG  capsule Commonly known as: NEURONTIN    metFORMIN  500 MG tablet Commonly known as: GLUCOPHAGE    multivitamin with minerals Tabs tablet   polyethylene glycol 17 g packet Commonly known as: MIRALAX  / GLYCOLAX        TAKE these medications    carvedilol  25 MG tablet Commonly known as: COREG  Take 1 tablet (25 mg total) by mouth 2 (two) times daily with a meal.   ondansetron  8 MG tablet Commonly known as: ZOFRAN  Take 1 tablet (8 mg total) by mouth every 8 (eight) hours as needed.        Discharge Exam: BP (!) 106/45 (BP Location: Right Arm)   Pulse 79   Temp 99.6 F (37.6 C) (Oral)   Resp 18   Ht 5\' 2"  (1.575 m)   Wt 71 kg   SpO2 98%   BMI 28.63 kg/m   General exam: Appears calm and comfortable   Condition at discharge: stable  The results of significant diagnostics from this hospitalization (including imaging, microbiology, ancillary and laboratory) are listed below for reference.   Imaging Studies: US  Paracentesis Result Date: 11/14/2023 INDICATION: Patient with history of primary breast cancer with metastasis, recurrent malignant ascites. IR consulted for therapeutic paracentesis. EXAM: ULTRASOUND GUIDED THERAPEUTIC PARACENTESIS MEDICATIONS: 10 mL 1% lidocaine  COMPLICATIONS: None immediate. PROCEDURE: Informed written consent was obtained from the patient's daughter after a discussion of the risks, benefits and alternatives to treatment. A timeout was performed prior to the initiation of the procedure. Initial ultrasound scanning demonstrates a moderate amount of ascites within the right lower abdominal quadrant. The right lower abdomen was  prepped and draped in the usual sterile fashion. 1% lidocaine  was used for local anesthesia. Following this, a 19 gauge, 10-cm, Yueh catheter was introduced. An ultrasound image was saved for documentation purposes. The paracentesis was performed. The catheter was removed and a dressing was applied. The patient tolerated the procedure  well without immediate post procedural complication. FINDINGS: A total of approximately 1.4 liters of yellow fluid was removed. IMPRESSION: Successful ultrasound-guided paracentesis yielding 1.4 liters of peritoneal fluid. Performed by: Wyatt Pommier, PA-C Electronically Signed   By: Nicoletta Barrier M.D.   On: 11/14/2023 17:30   CT HEAD WO CONTRAST ( ) Result Date: 11/12/2023 CLINICAL DATA:  Provided history: Mental status change, unknown cause. Additional history provided: Metastatic breast cancer, fever, confusion. EXAM: CT HEAD WITHOUT CONTRAST TECHNIQUE: Contiguous axial images were obtained from the base of the skull through the vertex without intravenous contrast. RADIATION DOSE REDUCTION: This exam was performed according to the departmental dose-optimization program which includes automated exposure control, adjustment of the mA and/or kV according to patient size and/or use of iterative reconstruction technique. COMPARISON:  Head CT 09/28/2023. Report from MRI brain and MRA head 05/15/2018. FINDINGS: Brain: Generalized cerebral atrophy. Chronic cortical/subcortical left PCA territory infarct again demonstrated within the left occipital lobe. Background mild patchy and ill-defined hypoattenuation within the cerebral white matter, nonspecific but compatible with chronic small vessel ischemic disease There is no acute intracranial hemorrhage. No acute demarcated cortical infarct. No extra-axial fluid collection. No evidence of an intracranial mass. No midline shift. Vascular: No hyperdense vessel.  Atherosclerotic calcifications. Skull: No calvarial fracture or aggressive osseous lesion. Sinuses/Orbits: No mass or acute finding within the imaged orbits. Trace mucosal thickening within left ethmoid air cells. IMPRESSION: 1. No evidence of an acute intracranial abnormality. 2. Known chronic left PCA territory infarct. 3. Background parenchymal atrophy and chronic small vessel ischemic disease. Electronically  Signed   By: Bascom Lily D.O.   On: 11/12/2023 09:24   DG CHEST PORT 1 VIEW Result Date: 11/10/2023 CLINICAL DATA:  Aspiration pneumonia EXAM: PORTABLE CHEST 1 VIEW COMPARISON:  None Available. FINDINGS: Similar appearance of left retrocardiac opacity. Minimal blunting of the right costophrenic angle. Heart mediastinum are unchanged. IMPRESSION: 1. No significant interval change. Electronically Signed   By: Reagan Camera M.D.   On: 11/10/2023 12:55   US  Paracentesis Result Date: 11/08/2023 INDICATION: Patient with history of primary breast cancer with metastasis to the liver, malignant ascites. IR consulted for diagnostic and therapeutic paracentesis. EXAM: ULTRASOUND GUIDED DIAGNOSTIC AND THERAPEUTIC PARACENTESIS MEDICATIONS: 10 mL 1% lidocaine  COMPLICATIONS: None immediate. PROCEDURE: Informed written consent was obtained from the patient after a discussion of the risks, benefits and alternatives to treatment. A timeout was performed prior to the initiation of the procedure. Initial ultrasound scanning demonstrates a moderate amount of ascites within the right lower abdominal quadrant. The right lower abdomen was prepped and draped in the usual sterile fashion. 1% lidocaine  was used for local anesthesia. Following this, a 19 gauge, 7-cm, Yueh catheter was introduced. An ultrasound image was saved for documentation purposes. The paracentesis was performed. The catheter was removed and a dressing was applied. The patient tolerated the procedure well without immediate post procedural complication. FINDINGS: A total of approximately 1.3 liters of yellow fluid was removed. Samples were sent to the laboratory as requested by the clinical team. IMPRESSION: Successful ultrasound-guided paracentesis yielding 1.3 liters of peritoneal fluid. Performed by: Wyatt Pommier, PA-C Electronically Signed   By: Erica Hau M.D.   On: 11/08/2023 14:53  CT ABDOMEN PELVIS W CONTRAST Result Date: 11/07/2023 CLINICAL DATA:   Abdominal pain, acute, nonlocalized; Pulmonary embolism (PE) suspected, high prob EXAM: CT ANGIOGRAPHY CHEST CT ABDOMEN AND PELVIS WITH CONTRAST TECHNIQUE: Multidetector CT imaging of the chest was performed using the standard protocol during bolus administration of intravenous contrast. Multiplanar CT image reconstructions and MIPs were obtained to evaluate the vascular anatomy. Multidetector CT imaging of the abdomen and pelvis was performed using the standard protocol during bolus administration of intravenous contrast. RADIATION DOSE REDUCTION: This exam was performed according to the departmental dose-optimization program which includes automated exposure control, adjustment of the mA and/or kV according to patient size and/or use of iterative reconstruction technique. CONTRAST:  <See Chart> OMNIPAQUE  IOHEXOL  300 MG/ML SOLN, OMNIPAQUE  IOHEXOL  350 MG/ML SOLN COMPARISON:  CT scan abdomen and pelvis from 09/27/2023 and CT chest from 09/29/2023. FINDINGS: CTA CHEST FINDINGS Cardiovascular: No evidence of embolism to the proximal subsegmental pulmonary artery level. Normal cardiac size. No pericardial effusion. No aortic aneurysm. There are coronary artery calcifications, in keeping with coronary artery disease. There are also mild peripheral atherosclerotic vascular calcifications of thoracic aorta and its major branches. Mediastinum/Nodes: Visualized thyroid  gland appears grossly unremarkable. No solid / cystic mediastinal masses. The esophagus is nondistended precluding optimal assessment. There is mild circumferential thickening of the lower thoracic esophagus, which is most likely seen in the settings of chronic gastroesophageal reflux disease versus esophagitis. No axillary, mediastinal or hilar lymphadenopathy by size criteria. Lungs/Pleura: The central tracheo-bronchial tree is patent. There are patchy areas of linear, plate-like atelectasis and/or scarring throughout bilateral lungs. There is trace  bilateral pleural effusion with associated compressive atelectatic changes in the bilateral lower lobes. There is a heterogeneous peripherally hyperattenuating and centrally hypoattenuating approximately 1.1 x 2.0 cm pleural based nodule in the left lung lower lobe, indeterminate, but unchanged since the prior study from 09/20/2023. Attention on follow-up examination is recommended. Bilateral lungs are otherwise clear. Musculoskeletal: Redemonstration of central left breast mass as well as asymmetric left skin breast skin thickening, grossly similar to the prior study. The visualized soft tissues of the chest wall are otherwise grossly unremarkable. Redemonstration of heterogeneous mixed sclerotic/lytic appearance of the sternal body along with pathological fracture, similar to the prior study. There are mild multilevel degenerative changes in the visualized spine. Review of the MIP images confirms the above findings. CT ABDOMEN and PELVIS FINDINGS Hepatobiliary: The liver is normal in size. Non-cirrhotic configuration. Redemonstration of extensive ill-defined heterogeneous hypoattenuating liver masses occupying approximately 50% of the total liver parenchyma, compatible with metastasis. Comparison with prior study is limited due to lack of intravenous contrast however, overall, there is no significant interval change. The hepatic vein and portal vein tributaries appear compressed however, no discrete thrombosis noted. No intrahepatic or extrahepatic bile duct dilation. Contracted gallbladder. No calcified gallstones. Normal gallbladder wall thickness. No pericholecystic inflammatory changes. Pancreas: Unremarkable. No pancreatic ductal dilatation or surrounding inflammatory changes. Spleen: Within normal limits. No focal lesion. Adrenals/Urinary Tract: Adrenal glands are unremarkable. No suspicious renal mass. Redemonstration of multiple simple cysts scattered throughout bilateral kidneys. No hydroureteronephrosis  or nephroureterolithiasis. Urinary bladder is under distended, precluding optimal assessment. However, no large mass or stones identified. No perivesical fat stranding. Stomach/Bowel: No disproportionate dilation of the small or large bowel loops. No evidence of abnormal bowel wall thickening or inflammatory changes. The appendix is unremarkable. Vascular/Lymphatic: There is small-to-moderate ascites, increased since the prior study. No pneumoperitoneum. No abdominal or pelvic lymphadenopathy, by size criteria. No aneurysmal dilation of  the major abdominal arteries. There are moderate peripheral atherosclerotic vascular calcifications of the aorta and its major branches. Reproductive: The uterus is surgically absent. No large adnexal mass. There are heterogeneous soft tissue attenuation areas along the bilateral pelvic sidewalls, which may represent residual ovarian tissue versus peritoneal carcinomatosis. No significant interval change. Other: There is a tiny fat containing umbilical hernia. There is small to moderate anasarca. Musculoskeletal: No suspicious osseous lesions. There are mild multilevel degenerative changes in the visualized spine. Review of the MIP images confirms the above findings. IMPRESSION: 1. No embolism to the proximal subsegmental pulmonary artery level. 2. There is trace bilateral pleural effusion with associated compressive atelectatic changes in the bilateral lower lobes. 3. Redemonstration of extensive liver metastases, grossly similar to the prior study. 4. There is small-to-moderate ascites, increased since the prior study. 5. Redemonstration of central left breast mass and asymmetric left breast skin thickening, grossly similar to the prior study. 6. Redemonstration of heterogeneous mixed sclerotic/lytic appearance of the sternal body along with pathological fracture, similar to the prior study. 7. Multiple other nonacute observations, as described above. Aortic Atherosclerosis  (ICD10-I70.0). Electronically Signed   By: Beula Brunswick M.D.   On: 11/07/2023 13:15   CT Angio Chest PE W/Cm &/Or Wo Cm Result Date: 11/07/2023 CLINICAL DATA:  Abdominal pain, acute, nonlocalized; Pulmonary embolism (PE) suspected, high prob EXAM: CT ANGIOGRAPHY CHEST CT ABDOMEN AND PELVIS WITH CONTRAST TECHNIQUE: Multidetector CT imaging of the chest was performed using the standard protocol during bolus administration of intravenous contrast. Multiplanar CT image reconstructions and MIPs were obtained to evaluate the vascular anatomy. Multidetector CT imaging of the abdomen and pelvis was performed using the standard protocol during bolus administration of intravenous contrast. RADIATION DOSE REDUCTION: This exam was performed according to the departmental dose-optimization program which includes automated exposure control, adjustment of the mA and/or kV according to patient size and/or use of iterative reconstruction technique. CONTRAST:  <See Chart> OMNIPAQUE  IOHEXOL  300 MG/ML SOLN, OMNIPAQUE  IOHEXOL  350 MG/ML SOLN COMPARISON:  CT scan abdomen and pelvis from 09/27/2023 and CT chest from 09/29/2023. FINDINGS: CTA CHEST FINDINGS Cardiovascular: No evidence of embolism to the proximal subsegmental pulmonary artery level. Normal cardiac size. No pericardial effusion. No aortic aneurysm. There are coronary artery calcifications, in keeping with coronary artery disease. There are also mild peripheral atherosclerotic vascular calcifications of thoracic aorta and its major branches. Mediastinum/Nodes: Visualized thyroid  gland appears grossly unremarkable. No solid / cystic mediastinal masses. The esophagus is nondistended precluding optimal assessment. There is mild circumferential thickening of the lower thoracic esophagus, which is most likely seen in the settings of chronic gastroesophageal reflux disease versus esophagitis. No axillary, mediastinal or hilar lymphadenopathy by size criteria. Lungs/Pleura:  The central tracheo-bronchial tree is patent. There are patchy areas of linear, plate-like atelectasis and/or scarring throughout bilateral lungs. There is trace bilateral pleural effusion with associated compressive atelectatic changes in the bilateral lower lobes. There is a heterogeneous peripherally hyperattenuating and centrally hypoattenuating approximately 1.1 x 2.0 cm pleural based nodule in the left lung lower lobe, indeterminate, but unchanged since the prior study from 09/20/2023. Attention on follow-up examination is recommended. Bilateral lungs are otherwise clear. Musculoskeletal: Redemonstration of central left breast mass as well as asymmetric left skin breast skin thickening, grossly similar to the prior study. The visualized soft tissues of the chest wall are otherwise grossly unremarkable. Redemonstration of heterogeneous mixed sclerotic/lytic appearance of the sternal body along with pathological fracture, similar to the prior study. There are mild multilevel  degenerative changes in the visualized spine. Review of the MIP images confirms the above findings. CT ABDOMEN and PELVIS FINDINGS Hepatobiliary: The liver is normal in size. Non-cirrhotic configuration. Redemonstration of extensive ill-defined heterogeneous hypoattenuating liver masses occupying approximately 50% of the total liver parenchyma, compatible with metastasis. Comparison with prior study is limited due to lack of intravenous contrast however, overall, there is no significant interval change. The hepatic vein and portal vein tributaries appear compressed however, no discrete thrombosis noted. No intrahepatic or extrahepatic bile duct dilation. Contracted gallbladder. No calcified gallstones. Normal gallbladder wall thickness. No pericholecystic inflammatory changes. Pancreas: Unremarkable. No pancreatic ductal dilatation or surrounding inflammatory changes. Spleen: Within normal limits. No focal lesion. Adrenals/Urinary Tract:  Adrenal glands are unremarkable. No suspicious renal mass. Redemonstration of multiple simple cysts scattered throughout bilateral kidneys. No hydroureteronephrosis or nephroureterolithiasis. Urinary bladder is under distended, precluding optimal assessment. However, no large mass or stones identified. No perivesical fat stranding. Stomach/Bowel: No disproportionate dilation of the small or large bowel loops. No evidence of abnormal bowel wall thickening or inflammatory changes. The appendix is unremarkable. Vascular/Lymphatic: There is small-to-moderate ascites, increased since the prior study. No pneumoperitoneum. No abdominal or pelvic lymphadenopathy, by size criteria. No aneurysmal dilation of the major abdominal arteries. There are moderate peripheral atherosclerotic vascular calcifications of the aorta and its major branches. Reproductive: The uterus is surgically absent. No large adnexal mass. There are heterogeneous soft tissue attenuation areas along the bilateral pelvic sidewalls, which may represent residual ovarian tissue versus peritoneal carcinomatosis. No significant interval change. Other: There is a tiny fat containing umbilical hernia. There is small to moderate anasarca. Musculoskeletal: No suspicious osseous lesions. There are mild multilevel degenerative changes in the visualized spine. Review of the MIP images confirms the above findings. IMPRESSION: 1. No embolism to the proximal subsegmental pulmonary artery level. 2. There is trace bilateral pleural effusion with associated compressive atelectatic changes in the bilateral lower lobes. 3. Redemonstration of extensive liver metastases, grossly similar to the prior study. 4. There is small-to-moderate ascites, increased since the prior study. 5. Redemonstration of central left breast mass and asymmetric left breast skin thickening, grossly similar to the prior study. 6. Redemonstration of heterogeneous mixed sclerotic/lytic appearance of the  sternal body along with pathological fracture, similar to the prior study. 7. Multiple other nonacute observations, as described above. Aortic Atherosclerosis (ICD10-I70.0). Electronically Signed   By: Beula Brunswick M.D.   On: 11/07/2023 13:15   DG Chest 2 View Result Date: 11/07/2023 CLINICAL DATA:  Shortness of breath. EXAM: CHEST - 2 VIEW COMPARISON:  09/27/2023. FINDINGS: Low lung volume. Mild diffuse pulmonary vascular congestion, likely accentuated by low lung volume. No frank pulmonary edema. Redemonstration of left retrocardiac airspace opacity obscuring the left hemidiaphragm, descending thoracic aorta and blunting the left lateral costophrenic angle, suggesting combination of left lung atelectasis and/or consolidation with pleural effusion. No significant interval change. Bilateral lung fields are otherwise clear. Right costophrenic angle is clear. Stable mildly enlarged cardio-mediastinal silhouette. Loop recorder device noted overlying the left anterior lower chest wall. No acute osseous abnormalities. The soft tissues are within normal limits. IMPRESSION: *Persistent left retrocardiac opacity, as described above. No significant interval change. Electronically Signed   By: Beula Brunswick M.D.   On: 11/07/2023 11:23    Microbiology: Results for orders placed or performed during the hospital encounter of 11/07/23  Body fluid culture w Gram Stain     Status: None   Collection Time: 11/08/23  2:52 PM   Specimen: PATH  Cytology Pleural fluid  Result Value Ref Range Status   Specimen Description   Final    PLEURAL Performed at [facility], [address] [place], [place] [address]    Special Requests   Final    NONE Performed at [facility], [address], [place], [place] [address]    Gram Stain   Final    RARE WBC PRESENT,BOTH PMN AND MONONUCLEAR NO ORGANISMS SEEN    Culture   Final    NO GROWTH 3 DAYS Performed at [facility],  [address], [place], [place] [address]    Report Status 11/11/2023 FINAL  Final  Culture, blood (Routine X 2) w Reflex to ID Panel     Status: None   Collection Time: 11/08/23  9:43 PM   Specimen: BLOOD LEFT HAND  Result Value Ref Range Status   Specimen Description   Final    BLOOD LEFT HAND Performed at [facility], [address] [place], [place] [address]    Special Requests   Final    BOTTLES DRAWN AEROBIC AND ANAEROBIC Blood Culture results may not be optimal due to an inadequate volume of blood received in culture bottles Performed at [facility], [address], [place], [place] [address]    Culture   Final    NO GROWTH 5 DAYS Performed at [facility], [address], [place], [place] [address]    Report Status 11/13/2023 FINAL  Final  Culture, blood (Routine X 2) w Reflex to ID Panel     Status: None   Collection Time: 11/08/23  9:43 PM   Specimen: BLOOD LEFT HAND  Result Value Ref Range Status   Specimen Description   Final    BLOOD LEFT HAND Performed at [facility], [address], [place], [place] [address]    Special Requests   Final    BOTTLES DRAWN AEROBIC AND ANAEROBIC Blood Culture results may not be optimal due to an inadequate volume of blood received in culture bottles Performed at [facility], [address], [place], [place] [address]    Culture   Final    NO GROWTH 5 DAYS Performed at [facility], [address] [place], [place] [address]    Report Status 11/13/2023 FINAL  Final  Culture, blood (Routine X 2) w Reflex to ID Panel     Status: None   Collection Time: 11/10/23 11:19 AM   Specimen: BLOOD LEFT ARM  Result Value Ref Range Status   Specimen Description   Final    BLOOD LEFT ARM BLOOD LEFT HAND Performed at [facility], [address] [place], [place] [address]    Special Requests   Final    BOTTLES DRAWN AEROBIC ONLY Blood Culture results may not be  optimal due to an inadequate volume of blood received in culture bottles Performed at [facility], [address], [place], [place] [address]    Culture   Final    NO GROWTH 5 DAYS Performed at [facility], [address], [place], [place] [address]    Report Status 11/15/2023 FINAL  Final  Culture, blood (Routine X 2) w Reflex to ID Panel     Status: None   Collection Time: 11/10/23 11:19 AM   Specimen: BLOOD  Result Value Ref Range Status   Specimen Description   Final    BLOOD  BLOOD LEFT HAND Performed at [facility], [address] [place], [place] [address]    Special Requests   Final    BOTTLES DRAWN AEROBIC ONLY Blood Culture results may not be optimal due to an inadequate volume of blood received in culture bottles Performed at [facility], [address] [place], [place] [address]    Culture   Final    NO GROWTH 5 DAYS Performed at [facility], [address], [place], [place] [address]    Report Status 11/15/2023 FINAL  Final    Discharge time spent: 35 minutes.  Signed: Aneita Keens, MD [facility] 11/24/2023

## 2023-11-27 ENCOUNTER — Ambulatory Visit: Admitting: Physician Assistant

## 2023-11-27 ENCOUNTER — Ambulatory Visit: Payer: Medicare HMO | Admitting: Family Medicine

## 2023-12-03 ENCOUNTER — Other Ambulatory Visit

## 2023-12-03 ENCOUNTER — Ambulatory Visit: Admitting: Hematology and Oncology

## 2023-12-03 ENCOUNTER — Other Ambulatory Visit: Payer: Self-pay | Admitting: *Deleted

## 2023-12-03 ENCOUNTER — Ambulatory Visit

## 2023-12-11 ENCOUNTER — Inpatient Hospital Stay

## 2023-12-11 ENCOUNTER — Inpatient Hospital Stay: Admitting: Hematology and Oncology

## 2023-12-24 ENCOUNTER — Ambulatory Visit

## 2023-12-24 ENCOUNTER — Ambulatory Visit: Admitting: Hematology and Oncology

## 2023-12-24 ENCOUNTER — Other Ambulatory Visit

## 2023-12-29 DEATH — deceased

## 2024-01-01 ENCOUNTER — Ambulatory Visit: Admitting: Adult Health

## 2024-01-01 ENCOUNTER — Other Ambulatory Visit

## 2024-01-01 ENCOUNTER — Ambulatory Visit

## 2024-01-07 ENCOUNTER — Ambulatory Visit

## 2024-01-07 ENCOUNTER — Ambulatory Visit: Admitting: Hematology and Oncology

## 2024-01-07 ENCOUNTER — Other Ambulatory Visit

## 2024-01-22 ENCOUNTER — Ambulatory Visit: Admitting: Hematology and Oncology

## 2024-01-22 ENCOUNTER — Ambulatory Visit

## 2024-01-22 ENCOUNTER — Other Ambulatory Visit
